# Patient Record
Sex: Male | Born: 1945
Health system: Southern US, Community
[De-identification: ages and names within clinical notes are randomized; demographics above are authoritative.]

## PROBLEM LIST (undated history)

## (undated) DIAGNOSIS — R0600 Dyspnea, unspecified: Secondary | ICD-10-CM

## (undated) DIAGNOSIS — I350 Nonrheumatic aortic (valve) stenosis: Secondary | ICD-10-CM

## (undated) DIAGNOSIS — L821 Other seborrheic keratosis: Secondary | ICD-10-CM

## (undated) DIAGNOSIS — N529 Male erectile dysfunction, unspecified: Secondary | ICD-10-CM

## (undated) DIAGNOSIS — H269 Unspecified cataract: Secondary | ICD-10-CM

## (undated) DIAGNOSIS — K259 Gastric ulcer, unspecified as acute or chronic, without hemorrhage or perforation: Secondary | ICD-10-CM

## (undated) DIAGNOSIS — I10 Essential (primary) hypertension: Secondary | ICD-10-CM

## (undated) DIAGNOSIS — E785 Hyperlipidemia, unspecified: Secondary | ICD-10-CM

## (undated) DIAGNOSIS — J309 Allergic rhinitis, unspecified: Secondary | ICD-10-CM

## (undated) DIAGNOSIS — I2510 Atherosclerotic heart disease of native coronary artery without angina pectoris: Secondary | ICD-10-CM

## (undated) DIAGNOSIS — K5790 Diverticulosis of intestine, part unspecified, without perforation or abscess without bleeding: Secondary | ICD-10-CM

## (undated) DIAGNOSIS — D126 Benign neoplasm of colon, unspecified: Secondary | ICD-10-CM

## (undated) DIAGNOSIS — T8859XA Other complications of anesthesia, initial encounter: Secondary | ICD-10-CM

## (undated) DIAGNOSIS — K649 Unspecified hemorrhoids: Secondary | ICD-10-CM

## (undated) DIAGNOSIS — M5386 Other specified dorsopathies, lumbar region: Secondary | ICD-10-CM

## (undated) DIAGNOSIS — I440 Atrioventricular block, first degree: Secondary | ICD-10-CM

## (undated) DIAGNOSIS — T4145XA Adverse effect of unspecified anesthetic, initial encounter: Secondary | ICD-10-CM

## (undated) DIAGNOSIS — R7303 Prediabetes: Secondary | ICD-10-CM

## (undated) DIAGNOSIS — M19019 Primary osteoarthritis, unspecified shoulder: Secondary | ICD-10-CM

## (undated) DIAGNOSIS — I480 Paroxysmal atrial fibrillation: Secondary | ICD-10-CM

## (undated) DIAGNOSIS — I5189 Other ill-defined heart diseases: Secondary | ICD-10-CM

## (undated) HISTORY — DX: Other ill-defined heart diseases: I51.89

## (undated) HISTORY — PX: CARDIOVERSION: SHX1299

## (undated) HISTORY — DX: Diverticulosis of intestine, part unspecified, without perforation or abscess without bleeding: K57.90

## (undated) HISTORY — DX: Gastric ulcer, unspecified as acute or chronic, without hemorrhage or perforation: K25.9

## (undated) HISTORY — PX: JOINT REPLACEMENT: SHX530

## (undated) HISTORY — DX: Atherosclerotic heart disease of native coronary artery without angina pectoris: I25.10

## (undated) HISTORY — DX: Essential (primary) hypertension: I10

## (undated) HISTORY — DX: Atrioventricular block, first degree: I44.0

## (undated) HISTORY — PX: COLONOSCOPY: SHX174

## (undated) HISTORY — DX: Primary osteoarthritis, unspecified shoulder: M19.019

## (undated) HISTORY — DX: Benign neoplasm of colon, unspecified: D12.6

## (undated) HISTORY — DX: Allergic rhinitis, unspecified: J30.9

## (undated) HISTORY — DX: Hyperlipidemia, unspecified: E78.5

## (undated) HISTORY — DX: Nonrheumatic aortic (valve) stenosis: I35.0

## (undated) HISTORY — DX: Unspecified hemorrhoids: K64.9

## (undated) HISTORY — DX: Other specified dorsopathies, lumbar region: M53.86

## (undated) HISTORY — PX: OTHER SURGICAL HISTORY: SHX169

## (undated) HISTORY — PX: TONSILLECTOMY: SUR1361

## (undated) HISTORY — DX: Male erectile dysfunction, unspecified: N52.9

## (undated) HISTORY — DX: Other seborrheic keratosis: L82.1

## (undated) HISTORY — DX: Paroxysmal atrial fibrillation: I48.0

---

## 2005-06-30 DIAGNOSIS — I509 Heart failure, unspecified: Secondary | ICD-10-CM

## 2005-06-30 HISTORY — DX: Heart failure, unspecified: I50.9

## 2005-06-30 HISTORY — PX: CARDIAC CATHETERIZATION: SHX172

## 2006-02-26 ENCOUNTER — Encounter: Admission: RE | Admit: 2006-02-26 | Discharge: 2006-04-02 | Payer: Self-pay | Admitting: Occupational Medicine

## 2006-03-30 ENCOUNTER — Encounter: Admission: RE | Admit: 2006-03-30 | Discharge: 2006-03-30 | Payer: Self-pay | Admitting: Occupational Medicine

## 2006-04-04 ENCOUNTER — Emergency Department (HOSPITAL_COMMUNITY): Admission: EM | Admit: 2006-04-04 | Discharge: 2006-04-04 | Payer: Self-pay | Admitting: Family Medicine

## 2006-04-05 ENCOUNTER — Emergency Department (HOSPITAL_COMMUNITY): Admission: EM | Admit: 2006-04-05 | Discharge: 2006-04-05 | Payer: Self-pay | Admitting: Family Medicine

## 2006-04-07 ENCOUNTER — Encounter (INDEPENDENT_AMBULATORY_CARE_PROVIDER_SITE_OTHER): Payer: Self-pay | Admitting: *Deleted

## 2006-04-07 ENCOUNTER — Ambulatory Visit: Payer: Self-pay | Admitting: Cardiovascular Disease

## 2006-04-07 ENCOUNTER — Inpatient Hospital Stay (HOSPITAL_COMMUNITY): Admission: EM | Admit: 2006-04-07 | Discharge: 2006-04-14 | Payer: Self-pay | Admitting: Emergency Medicine

## 2006-04-07 ENCOUNTER — Ambulatory Visit: Payer: Self-pay | Admitting: Internal Medicine

## 2006-04-10 ENCOUNTER — Encounter (INDEPENDENT_AMBULATORY_CARE_PROVIDER_SITE_OTHER): Payer: Self-pay | Admitting: *Deleted

## 2006-04-28 ENCOUNTER — Ambulatory Visit: Payer: Self-pay | Admitting: Cardiology

## 2006-05-01 ENCOUNTER — Encounter (INDEPENDENT_AMBULATORY_CARE_PROVIDER_SITE_OTHER): Payer: Self-pay | Admitting: *Deleted

## 2006-05-01 ENCOUNTER — Ambulatory Visit: Payer: Self-pay | Admitting: Internal Medicine

## 2006-05-01 LAB — CONVERTED CEMR LAB
BUN: 9 mg/dL (ref 6–23)
CO2: 28 meq/L (ref 19–32)
Calcium: 9.2 mg/dL (ref 8.4–10.5)
Chloride: 102 meq/L (ref 96–112)
Creatinine, Ser: 0.8 mg/dL (ref 0.40–1.50)
Glucose, Bld: 83 mg/dL (ref 70–99)
Potassium: 3.5 meq/L (ref 3.5–5.3)
Sodium: 139 meq/L (ref 135–145)

## 2006-05-02 ENCOUNTER — Ambulatory Visit (HOSPITAL_COMMUNITY): Admission: RE | Admit: 2006-05-02 | Discharge: 2006-05-02 | Payer: Self-pay | Admitting: Specialist

## 2006-06-17 ENCOUNTER — Ambulatory Visit: Payer: Self-pay | Admitting: Cardiology

## 2006-07-14 DIAGNOSIS — I428 Other cardiomyopathies: Secondary | ICD-10-CM | POA: Insufficient documentation

## 2006-07-14 DIAGNOSIS — I480 Paroxysmal atrial fibrillation: Secondary | ICD-10-CM

## 2006-07-14 DIAGNOSIS — D509 Iron deficiency anemia, unspecified: Secondary | ICD-10-CM | POA: Insufficient documentation

## 2006-07-14 HISTORY — DX: Paroxysmal atrial fibrillation: I48.0

## 2006-07-20 ENCOUNTER — Encounter: Admission: RE | Admit: 2006-07-20 | Discharge: 2006-10-18 | Payer: Self-pay | Admitting: Specialist

## 2006-07-22 ENCOUNTER — Ambulatory Visit: Payer: Self-pay | Admitting: Cardiology

## 2006-07-22 LAB — CONVERTED CEMR LAB
ALT: 7 units/L (ref 0–40)
AST: 18 units/L (ref 0–37)
Albumin: 3.4 g/dL — ABNORMAL LOW (ref 3.5–5.2)
Alkaline Phosphatase: 101 units/L (ref 39–117)
BUN: 10 mg/dL (ref 6–23)
CO2: 27 meq/L (ref 19–32)
Calcium: 8.9 mg/dL (ref 8.4–10.5)
Chloride: 102 meq/L (ref 96–112)
Cholesterol: 120 mg/dL (ref 0–200)
Creatinine, Ser: 1 mg/dL (ref 0.4–1.5)
GFR calc Af Amer: 99 mL/min
GFR calc non Af Amer: 82 mL/min
Glucose, Bld: 81 mg/dL (ref 70–99)
HDL: 26.1 mg/dL — ABNORMAL LOW (ref >39.0)
LDL Cholesterol: 74 mg/dL (ref 0–99)
Potassium: 3.9 meq/L (ref 3.5–5.1)
Sodium: 137 meq/L (ref 135–145)
Total Bilirubin: 0.6 mg/dL (ref 0.3–1.2)
Total CHOL/HDL Ratio: 4.6
Total Protein: 7.1 g/dL (ref 6.0–8.3)
Triglycerides: 101 mg/dL (ref 0–149)
VLDL: 20 mg/dL (ref 0–40)

## 2006-09-15 ENCOUNTER — Ambulatory Visit: Payer: Self-pay | Admitting: Internal Medicine

## 2006-09-15 ENCOUNTER — Encounter (INDEPENDENT_AMBULATORY_CARE_PROVIDER_SITE_OTHER): Payer: Self-pay | Admitting: *Deleted

## 2006-09-15 ENCOUNTER — Ambulatory Visit: Payer: Self-pay | Admitting: Cardiology

## 2006-09-15 LAB — CONVERTED CEMR LAB
BUN: 19 mg/dL (ref 6–23)
CO2: 22 meq/L (ref 19–32)
Calcium: 9.4 mg/dL (ref 8.4–10.5)
Chloride: 102 meq/L (ref 96–112)
Creatinine, Ser: 0.96 mg/dL (ref 0.40–1.50)
Glucose, Bld: 99 mg/dL (ref 70–99)
HCT: 37.7 % — ABNORMAL LOW (ref 39.0–52.0)
Hemoglobin: 11.9 g/dL — ABNORMAL LOW (ref 13.0–17.0)
MCHC: 31.6 g/dL (ref 30.0–36.0)
MCV: 87.3 fL (ref 78.0–100.0)
Platelets: 360 10*3/uL (ref 150–400)
Potassium: 4.2 meq/L (ref 3.5–5.3)
RBC: 4.32 M/uL (ref 4.22–5.81)
RDW: 15.1 % — ABNORMAL HIGH (ref 11.5–14.0)
Sodium: 136 meq/L (ref 135–145)
WBC: 10.2 10*3/uL (ref 4.0–10.5)

## 2006-09-22 ENCOUNTER — Ambulatory Visit: Payer: Self-pay | Admitting: Cardiology

## 2006-09-28 ENCOUNTER — Ambulatory Visit: Payer: Self-pay | Admitting: Internal Medicine

## 2006-09-28 LAB — CONVERTED CEMR LAB: INR: 2.4

## 2006-10-05 ENCOUNTER — Encounter: Payer: Self-pay | Admitting: Pharmacist

## 2006-10-05 ENCOUNTER — Ambulatory Visit: Payer: Self-pay | Admitting: *Deleted

## 2006-10-05 LAB — CONVERTED CEMR LAB: INR: 2.4

## 2006-10-12 ENCOUNTER — Ambulatory Visit: Payer: Self-pay | Admitting: Hospitalist

## 2006-10-12 LAB — CONVERTED CEMR LAB: INR: 3

## 2006-10-19 ENCOUNTER — Ambulatory Visit: Payer: Self-pay | Admitting: Internal Medicine

## 2006-10-19 LAB — CONVERTED CEMR LAB: INR: 2.6

## 2006-10-27 ENCOUNTER — Ambulatory Visit: Payer: Self-pay | Admitting: Hospitalist

## 2006-10-27 LAB — CONVERTED CEMR LAB: INR: 2.9

## 2006-10-28 ENCOUNTER — Ambulatory Visit: Payer: Self-pay | Admitting: *Deleted

## 2006-10-28 ENCOUNTER — Encounter (INDEPENDENT_AMBULATORY_CARE_PROVIDER_SITE_OTHER): Payer: Self-pay | Admitting: *Deleted

## 2006-10-28 LAB — CONVERTED CEMR LAB
ALT: <8 units/L (ref 0–53)
AST: 12 units/L (ref 0–37)
Albumin: 3.8 g/dL (ref 3.5–5.2)
Alkaline Phosphatase: 99 units/L (ref 39–117)
Bilirubin, Direct: 0.1 mg/dL (ref 0.0–0.3)
Cholesterol: 145 mg/dL (ref 0–200)
HDL: 24 mg/dL — ABNORMAL LOW (ref >39)
Indirect Bilirubin: 0.4 mg/dL (ref 0.0–0.9)
LDL Cholesterol: 90 mg/dL (ref 0–99)
Total Bilirubin: 0.5 mg/dL (ref 0.3–1.2)
Total CHOL/HDL Ratio: 6
Total Protein: 7.1 g/dL (ref 6.0–8.3)
Triglycerides: 156 mg/dL — ABNORMAL HIGH (ref <150)
VLDL: 31 mg/dL (ref 0–40)

## 2006-11-03 ENCOUNTER — Ambulatory Visit (HOSPITAL_COMMUNITY): Admission: RE | Admit: 2006-11-03 | Discharge: 2006-11-03 | Payer: Self-pay | Admitting: Cardiology

## 2006-11-03 ENCOUNTER — Ambulatory Visit: Payer: Self-pay | Admitting: Cardiology

## 2006-11-03 ENCOUNTER — Ambulatory Visit: Payer: Self-pay | Admitting: Internal Medicine

## 2006-11-03 LAB — CONVERTED CEMR LAB: INR: 2.6

## 2006-11-10 ENCOUNTER — Encounter (INDEPENDENT_AMBULATORY_CARE_PROVIDER_SITE_OTHER): Payer: Self-pay | Admitting: Pharmacist

## 2006-11-10 ENCOUNTER — Ambulatory Visit: Payer: Self-pay | Admitting: Internal Medicine

## 2006-11-10 LAB — CONVERTED CEMR LAB: INR: 2.5

## 2006-11-11 ENCOUNTER — Encounter: Payer: Self-pay | Admitting: Pharmacist

## 2006-11-17 ENCOUNTER — Ambulatory Visit: Payer: Self-pay | Admitting: Hospitalist

## 2006-11-17 LAB — CONVERTED CEMR LAB: INR: 2.3

## 2006-11-18 ENCOUNTER — Ambulatory Visit: Payer: Self-pay | Admitting: Cardiology

## 2006-11-24 ENCOUNTER — Ambulatory Visit: Payer: Self-pay | Admitting: Internal Medicine

## 2006-11-26 ENCOUNTER — Ambulatory Visit (HOSPITAL_COMMUNITY): Admission: RE | Admit: 2006-11-26 | Discharge: 2006-11-26 | Payer: Self-pay | Admitting: Cardiology

## 2006-11-30 ENCOUNTER — Ambulatory Visit: Payer: Self-pay | Admitting: Internal Medicine

## 2006-11-30 LAB — CONVERTED CEMR LAB: INR: 4.7

## 2006-12-01 LAB — CONVERTED CEMR LAB
INR: 2.2
Prothrombin Time: 2.2 s

## 2006-12-02 ENCOUNTER — Ambulatory Visit: Payer: Self-pay | Admitting: Cardiology

## 2006-12-02 ENCOUNTER — Ambulatory Visit (HOSPITAL_COMMUNITY): Admission: RE | Admit: 2006-12-02 | Discharge: 2006-12-02 | Payer: Self-pay | Admitting: Cardiology

## 2006-12-02 ENCOUNTER — Encounter: Payer: Self-pay | Admitting: Cardiology

## 2006-12-08 ENCOUNTER — Ambulatory Visit: Payer: Self-pay | Admitting: Internal Medicine

## 2006-12-08 LAB — CONVERTED CEMR LAB: INR: 3.9

## 2006-12-23 ENCOUNTER — Ambulatory Visit: Payer: Self-pay | Admitting: Cardiology

## 2006-12-23 ENCOUNTER — Ambulatory Visit: Payer: Self-pay | Admitting: Internal Medicine

## 2006-12-23 LAB — CONVERTED CEMR LAB: INR: 1.3

## 2007-01-04 ENCOUNTER — Ambulatory Visit: Payer: Self-pay | Admitting: Internal Medicine

## 2007-01-04 ENCOUNTER — Encounter: Payer: Self-pay | Admitting: Pharmacist

## 2007-01-04 LAB — CONVERTED CEMR LAB
INR: 3.4
INR: 3.4

## 2007-01-06 ENCOUNTER — Encounter (INDEPENDENT_AMBULATORY_CARE_PROVIDER_SITE_OTHER): Payer: Self-pay | Admitting: *Deleted

## 2007-01-07 ENCOUNTER — Ambulatory Visit: Payer: Self-pay | Admitting: Internal Medicine

## 2007-01-07 DIAGNOSIS — M25519 Pain in unspecified shoulder: Secondary | ICD-10-CM | POA: Insufficient documentation

## 2007-01-18 ENCOUNTER — Ambulatory Visit: Payer: Self-pay | Admitting: *Deleted

## 2007-01-18 LAB — CONVERTED CEMR LAB: INR: 4.5

## 2007-01-28 ENCOUNTER — Encounter: Payer: Self-pay | Admitting: Pharmacist

## 2007-01-28 LAB — CONVERTED CEMR LAB: INR: 3.2

## 2007-02-18 ENCOUNTER — Encounter: Payer: Self-pay | Admitting: Pharmacist

## 2007-02-18 LAB — CONVERTED CEMR LAB: INR: 2.7

## 2007-03-03 ENCOUNTER — Ambulatory Visit (HOSPITAL_COMMUNITY): Admission: RE | Admit: 2007-03-03 | Discharge: 2007-03-03 | Payer: Self-pay | Admitting: Cardiology

## 2007-03-03 ENCOUNTER — Ambulatory Visit: Payer: Self-pay | Admitting: Cardiology

## 2007-03-03 LAB — CONVERTED CEMR LAB
ALT: 14 units/L (ref 0–53)
AST: 20 units/L (ref 0–37)
Albumin: 3.6 g/dL (ref 3.5–5.2)
Alkaline Phosphatase: 97 units/L (ref 39–117)
BUN: 26 mg/dL — ABNORMAL HIGH (ref 6–23)
Basophils Absolute: 0.1 10*3/uL (ref 0.0–0.1)
Basophils Relative: 1 % (ref 0.0–1.0)
Bilirubin, Direct: 0.1 mg/dL (ref 0.0–0.3)
CO2: 26 meq/L (ref 19–32)
Calcium: 9 mg/dL (ref 8.4–10.5)
Chloride: 108 meq/L (ref 96–112)
Cholesterol: 157 mg/dL (ref 0–200)
Creatinine, Ser: 1.8 mg/dL — ABNORMAL HIGH (ref 0.4–1.5)
Direct LDL: 89.6 mg/dL
Eosinophils Absolute: 0.6 10*3/uL (ref 0.0–0.6)
Eosinophils Relative: 4.9 % (ref 0.0–5.0)
GFR calc Af Amer: 50 mL/min
GFR calc non Af Amer: 41 mL/min
Glucose, Bld: 88 mg/dL (ref 70–99)
HCT: 35.5 % — ABNORMAL LOW (ref 39.0–52.0)
HDL: 20.1 mg/dL — ABNORMAL LOW (ref >39.0)
Hemoglobin: 12.5 g/dL — ABNORMAL LOW (ref 13.0–17.0)
Lymphocytes Relative: 28 % (ref 12.0–46.0)
MCHC: 35.2 g/dL (ref 30.0–36.0)
MCV: 89.5 fL (ref 78.0–100.0)
Monocytes Absolute: 0.7 10*3/uL (ref 0.2–0.7)
Monocytes Relative: 5.1 % (ref 3.0–11.0)
Neutro Abs: 8 10*3/uL — ABNORMAL HIGH (ref 1.4–7.7)
Neutrophils Relative %: 61 % (ref 43.0–77.0)
Platelets: 237 10*3/uL (ref 150–400)
Potassium: 4.2 meq/L (ref 3.5–5.1)
RBC: 3.97 M/uL — ABNORMAL LOW (ref 4.22–5.81)
RDW: 15.6 % — ABNORMAL HIGH (ref 11.5–14.6)
Sodium: 141 meq/L (ref 135–145)
TSH: 2.84 microintl units/mL (ref 0.35–5.50)
Total Bilirubin: 0.5 mg/dL (ref 0.3–1.2)
Total CHOL/HDL Ratio: 7.8
Total Protein: 7.1 g/dL (ref 6.0–8.3)
Triglycerides: 334 mg/dL (ref 0–149)
VLDL: 67 mg/dL — ABNORMAL HIGH (ref 0–40)
WBC: 13 10*3/uL — ABNORMAL HIGH (ref 4.5–10.5)

## 2007-03-05 ENCOUNTER — Telehealth (INDEPENDENT_AMBULATORY_CARE_PROVIDER_SITE_OTHER): Payer: Self-pay | Admitting: *Deleted

## 2007-03-23 ENCOUNTER — Ambulatory Visit: Payer: Self-pay | Admitting: Internal Medicine

## 2007-03-23 LAB — CONVERTED CEMR LAB: INR: 3.1

## 2007-04-15 ENCOUNTER — Encounter (INDEPENDENT_AMBULATORY_CARE_PROVIDER_SITE_OTHER): Payer: Self-pay | Admitting: *Deleted

## 2007-04-16 ENCOUNTER — Ambulatory Visit: Payer: Self-pay | Admitting: *Deleted

## 2007-04-16 ENCOUNTER — Encounter (INDEPENDENT_AMBULATORY_CARE_PROVIDER_SITE_OTHER): Payer: Self-pay | Admitting: *Deleted

## 2007-04-16 LAB — CONVERTED CEMR LAB
ALT: 12 units/L (ref 0–53)
AST: 18 units/L (ref 0–37)
Albumin: 4.2 g/dL (ref 3.5–5.2)
Alkaline Phosphatase: 92 units/L (ref 39–117)
BUN: 17 mg/dL (ref 6–23)
Bilirubin, Direct: 0.1 mg/dL (ref 0.0–0.3)
CO2: 25 meq/L (ref 19–32)
Calcium: 8.8 mg/dL (ref 8.4–10.5)
Chloride: 102 meq/L (ref 96–112)
Cholesterol: 135 mg/dL (ref 0–200)
Creatinine, Ser: 1.18 mg/dL (ref 0.40–1.50)
Glucose, Bld: 82 mg/dL (ref 70–99)
HCT: 38.9 % — ABNORMAL LOW (ref 39.0–52.0)
HDL: 26 mg/dL — ABNORMAL LOW (ref >39)
Hemoglobin: 12.4 g/dL — ABNORMAL LOW (ref 13.0–17.0)
Indirect Bilirubin: 0.4 mg/dL (ref 0.0–0.9)
LDL Cholesterol: 76 mg/dL (ref 0–99)
MCHC: 31.9 g/dL (ref 30.0–36.0)
MCV: 99.2 fL (ref 78.0–100.0)
Platelets: 212 10*3/uL (ref 150–400)
Potassium: 4.5 meq/L (ref 3.5–5.3)
RBC: 3.92 M/uL — ABNORMAL LOW (ref 4.22–5.81)
RDW: 14.6 % — ABNORMAL HIGH (ref 11.5–14.0)
Sodium: 138 meq/L (ref 135–145)
Total Bilirubin: 0.5 mg/dL (ref 0.3–1.2)
Total CHOL/HDL Ratio: 5.2
Total Protein: 7.4 g/dL (ref 6.0–8.3)
Triglycerides: 163 mg/dL — ABNORMAL HIGH (ref <150)
VLDL: 33 mg/dL (ref 0–40)
WBC: 12.1 10*3/uL — ABNORMAL HIGH (ref 4.0–10.5)

## 2007-04-20 ENCOUNTER — Encounter (INDEPENDENT_AMBULATORY_CARE_PROVIDER_SITE_OTHER): Payer: Self-pay | Admitting: *Deleted

## 2007-04-26 ENCOUNTER — Ambulatory Visit: Payer: Self-pay | Admitting: Infectious Diseases

## 2007-04-26 LAB — CONVERTED CEMR LAB: INR: 2.1

## 2007-05-24 ENCOUNTER — Ambulatory Visit: Payer: Self-pay | Admitting: Hospitalist

## 2007-05-24 LAB — CONVERTED CEMR LAB: INR: 2.5

## 2007-06-28 ENCOUNTER — Ambulatory Visit: Payer: Self-pay | Admitting: Internal Medicine

## 2007-06-28 LAB — CONVERTED CEMR LAB: INR: 3.3

## 2007-07-16 ENCOUNTER — Encounter: Admission: RE | Admit: 2007-07-16 | Discharge: 2007-07-16 | Payer: Self-pay | Admitting: Internal Medicine

## 2007-08-04 ENCOUNTER — Encounter: Payer: Self-pay | Admitting: Pharmacist

## 2007-08-04 ENCOUNTER — Ambulatory Visit: Payer: Self-pay | Admitting: Hospitalist

## 2007-08-04 LAB — CONVERTED CEMR LAB
INR: 7
INR: 7

## 2007-08-06 ENCOUNTER — Ambulatory Visit: Payer: Self-pay | Admitting: Internal Medicine

## 2007-08-06 LAB — CONVERTED CEMR LAB: INR: 3.6

## 2007-08-09 ENCOUNTER — Ambulatory Visit: Payer: Self-pay | Admitting: Hospitalist

## 2007-08-09 LAB — CONVERTED CEMR LAB: INR: 2

## 2007-08-20 ENCOUNTER — Ambulatory Visit (HOSPITAL_COMMUNITY): Admission: RE | Admit: 2007-08-20 | Discharge: 2007-08-20 | Payer: Self-pay | Admitting: Orthopedic Surgery

## 2007-09-02 ENCOUNTER — Ambulatory Visit: Payer: Self-pay | Admitting: *Deleted

## 2007-09-02 LAB — CONVERTED CEMR LAB: INR: 4.2

## 2007-09-03 ENCOUNTER — Ambulatory Visit: Payer: Self-pay | Admitting: Cardiology

## 2007-09-06 ENCOUNTER — Ambulatory Visit: Payer: Self-pay | Admitting: Cardiology

## 2007-09-06 LAB — CONVERTED CEMR LAB
ALT: 19 units/L (ref 0–53)
AST: 23 units/L (ref 0–37)
Albumin: 4.2 g/dL (ref 3.5–5.2)
Alkaline Phosphatase: 57 units/L (ref 39–117)
BUN: 41 mg/dL — ABNORMAL HIGH (ref 6–23)
Basophils Absolute: 0.1 10*3/uL (ref 0.0–0.1)
Basophils Relative: 1 % (ref 0.0–1.0)
Bilirubin, Direct: 0.1 mg/dL (ref 0.0–0.3)
CO2: 24 meq/L (ref 19–32)
Calcium: 9.4 mg/dL (ref 8.4–10.5)
Chloride: 110 meq/L (ref 96–112)
Cholesterol: 177 mg/dL (ref 0–200)
Creatinine, Ser: 1.5 mg/dL (ref 0.4–1.5)
Eosinophils Absolute: 0.5 10*3/uL (ref 0.0–0.6)
Eosinophils Relative: 4.4 % (ref 0.0–5.0)
GFR calc Af Amer: 61 mL/min
GFR calc non Af Amer: 51 mL/min
Glucose, Bld: 101 mg/dL — ABNORMAL HIGH (ref 70–99)
HCT: 38.1 % — ABNORMAL LOW (ref 39.0–52.0)
HDL: 33.3 mg/dL — ABNORMAL LOW (ref >39.0)
Hemoglobin: 12.7 g/dL — ABNORMAL LOW (ref 13.0–17.0)
LDL Cholesterol: 104 mg/dL — ABNORMAL HIGH (ref 0–99)
Lymphocytes Relative: 23.2 % (ref 12.0–46.0)
MCHC: 33.4 g/dL (ref 30.0–36.0)
MCV: 95.9 fL (ref 78.0–100.0)
Monocytes Absolute: 0.7 10*3/uL (ref 0.2–0.7)
Monocytes Relative: 5.9 % (ref 3.0–11.0)
Neutro Abs: 7.7 10*3/uL (ref 1.4–7.7)
Neutrophils Relative %: 65.5 % (ref 43.0–77.0)
Platelets: 215 10*3/uL (ref 150–400)
Potassium: 5.4 meq/L — ABNORMAL HIGH (ref 3.5–5.1)
RBC: 3.98 M/uL — ABNORMAL LOW (ref 4.22–5.81)
RDW: 12.9 % (ref 11.5–14.6)
Sodium: 139 meq/L (ref 135–145)
TSH: 1.88 microintl units/mL (ref 0.35–5.50)
Total Bilirubin: 0.5 mg/dL (ref 0.3–1.2)
Total CHOL/HDL Ratio: 5.3
Total Protein: 7.8 g/dL (ref 6.0–8.3)
Triglycerides: 200 mg/dL — ABNORMAL HIGH (ref 0–149)
VLDL: 40 mg/dL (ref 0–40)
WBC: 11.7 10*3/uL — ABNORMAL HIGH (ref 4.5–10.5)

## 2007-09-21 ENCOUNTER — Ambulatory Visit (HOSPITAL_COMMUNITY): Admission: RE | Admit: 2007-09-21 | Discharge: 2007-09-22 | Payer: Self-pay | Admitting: Orthopedic Surgery

## 2007-10-07 ENCOUNTER — Ambulatory Visit: Payer: Self-pay | Admitting: Infectious Disease

## 2007-10-07 ENCOUNTER — Telehealth (INDEPENDENT_AMBULATORY_CARE_PROVIDER_SITE_OTHER): Payer: Self-pay | Admitting: Pharmacist

## 2007-10-25 ENCOUNTER — Ambulatory Visit: Payer: Self-pay | Admitting: Hospitalist

## 2007-10-25 ENCOUNTER — Ambulatory Visit: Payer: Self-pay

## 2007-10-25 LAB — CONVERTED CEMR LAB: INR: 2.5

## 2007-10-26 ENCOUNTER — Encounter: Admission: RE | Admit: 2007-10-26 | Discharge: 2008-01-24 | Payer: Self-pay | Admitting: Orthopedic Surgery

## 2007-12-01 ENCOUNTER — Ambulatory Visit: Payer: Self-pay | Admitting: *Deleted

## 2007-12-01 ENCOUNTER — Encounter: Payer: Self-pay | Admitting: Pharmacist

## 2007-12-01 LAB — CONVERTED CEMR LAB: INR: 1.8

## 2007-12-27 ENCOUNTER — Ambulatory Visit: Payer: Self-pay | Admitting: Cardiology

## 2007-12-27 LAB — CONVERTED CEMR LAB
ALT: 16 units/L (ref 0–53)
AST: 23 units/L (ref 0–37)
Albumin: 3.9 g/dL (ref 3.5–5.2)
Alkaline Phosphatase: 75 units/L (ref 39–117)
Bilirubin, Direct: 0.1 mg/dL (ref 0.0–0.3)
Cholesterol: 97 mg/dL (ref 0–200)
HDL: 29 mg/dL — ABNORMAL LOW (ref >39.0)
LDL Cholesterol: 43 mg/dL (ref 0–99)
Total Bilirubin: 0.5 mg/dL (ref 0.3–1.2)
Total CHOL/HDL Ratio: 3.3
Total Protein: 7.8 g/dL (ref 6.0–8.3)
Triglycerides: 127 mg/dL (ref 0–149)
VLDL: 25 mg/dL (ref 0–40)

## 2008-01-18 ENCOUNTER — Ambulatory Visit: Payer: Self-pay | Admitting: Internal Medicine

## 2008-01-25 ENCOUNTER — Encounter: Admission: RE | Admit: 2008-01-25 | Discharge: 2008-01-27 | Payer: Self-pay | Admitting: Orthopedic Surgery

## 2008-08-28 ENCOUNTER — Ambulatory Visit: Payer: Self-pay | Admitting: Cardiology

## 2008-09-07 ENCOUNTER — Ambulatory Visit: Payer: Self-pay

## 2008-09-07 ENCOUNTER — Encounter: Payer: Self-pay | Admitting: Cardiology

## 2008-09-07 ENCOUNTER — Ambulatory Visit: Payer: Self-pay | Admitting: Cardiology

## 2008-09-07 LAB — CONVERTED CEMR LAB
ALT: 19 units/L (ref 0–53)
AST: 27 units/L (ref 0–37)
Albumin: 3.8 g/dL (ref 3.5–5.2)
Alkaline Phosphatase: 67 units/L (ref 39–117)
BUN: 26 mg/dL — ABNORMAL HIGH (ref 6–23)
Bilirubin, Direct: 0.2 mg/dL (ref 0.0–0.3)
CO2: 24 meq/L (ref 19–32)
Calcium: 8.9 mg/dL (ref 8.4–10.5)
Chloride: 109 meq/L (ref 96–112)
Cholesterol: 100 mg/dL (ref 0–200)
Creatinine, Ser: 1 mg/dL (ref 0.4–1.5)
GFR calc Af Amer: 98 mL/min
GFR calc non Af Amer: 81 mL/min
Glucose, Bld: 94 mg/dL (ref 70–99)
HDL: 23.6 mg/dL — ABNORMAL LOW (ref >39.0)
LDL Cholesterol: 62 mg/dL (ref 0–99)
Potassium: 4.8 meq/L (ref 3.5–5.1)
Sodium: 139 meq/L (ref 135–145)
Total Bilirubin: 0.8 mg/dL (ref 0.3–1.2)
Total CHOL/HDL Ratio: 4.2
Total Protein: 6.9 g/dL (ref 6.0–8.3)
Triglycerides: 71 mg/dL (ref 0–149)
VLDL: 14 mg/dL (ref 0–40)

## 2009-04-17 ENCOUNTER — Encounter: Admission: RE | Admit: 2009-04-17 | Discharge: 2009-04-17 | Payer: Self-pay | Admitting: Specialist

## 2009-05-07 ENCOUNTER — Ambulatory Visit: Payer: Self-pay | Admitting: Internal Medicine

## 2009-05-07 DIAGNOSIS — N529 Male erectile dysfunction, unspecified: Secondary | ICD-10-CM

## 2009-05-07 DIAGNOSIS — M79609 Pain in unspecified limb: Secondary | ICD-10-CM | POA: Insufficient documentation

## 2009-05-07 DIAGNOSIS — R42 Dizziness and giddiness: Secondary | ICD-10-CM | POA: Insufficient documentation

## 2009-05-07 HISTORY — DX: Male erectile dysfunction, unspecified: N52.9

## 2009-05-08 LAB — CONVERTED CEMR LAB
BUN: 30 mg/dL — ABNORMAL HIGH (ref 6–23)
CO2: 17 meq/L — ABNORMAL LOW (ref 19–32)
Calcium: 9.2 mg/dL (ref 8.4–10.5)
Chloride: 111 meq/L (ref 96–112)
Creatinine, Ser: 1.2 mg/dL (ref 0.40–1.50)
Glucose, Bld: 127 mg/dL — ABNORMAL HIGH (ref 70–99)
Potassium: 4.4 meq/L (ref 3.5–5.3)
Sodium: 140 meq/L (ref 135–145)

## 2009-05-09 ENCOUNTER — Ambulatory Visit: Payer: Self-pay | Admitting: Infectious Disease

## 2009-05-09 LAB — CONVERTED CEMR LAB
BUN: 43 mg/dL — ABNORMAL HIGH (ref 6–23)
CO2: 18 meq/L — ABNORMAL LOW (ref 19–32)
Calcium: 9.4 mg/dL (ref 8.4–10.5)
Chloride: 108 meq/L (ref 96–112)
Creatinine, Ser: 1.54 mg/dL — ABNORMAL HIGH (ref 0.40–1.50)
Glucose, Bld: 110 mg/dL — ABNORMAL HIGH (ref 70–99)
Potassium: 4.6 meq/L (ref 3.5–5.3)
Sodium: 140 meq/L (ref 135–145)

## 2009-05-14 ENCOUNTER — Ambulatory Visit (HOSPITAL_COMMUNITY): Admission: RE | Admit: 2009-05-14 | Discharge: 2009-05-14 | Payer: Self-pay | Admitting: Internal Medicine

## 2009-07-16 ENCOUNTER — Telehealth: Payer: Self-pay | Admitting: Cardiology

## 2009-10-02 DIAGNOSIS — I2510 Atherosclerotic heart disease of native coronary artery without angina pectoris: Secondary | ICD-10-CM | POA: Insufficient documentation

## 2009-10-02 DIAGNOSIS — E785 Hyperlipidemia, unspecified: Secondary | ICD-10-CM | POA: Insufficient documentation

## 2009-10-02 DIAGNOSIS — I25118 Atherosclerotic heart disease of native coronary artery with other forms of angina pectoris: Secondary | ICD-10-CM

## 2009-10-02 HISTORY — DX: Atherosclerotic heart disease of native coronary artery without angina pectoris: I25.10

## 2009-10-02 HISTORY — DX: Hyperlipidemia, unspecified: E78.5

## 2009-10-03 ENCOUNTER — Ambulatory Visit: Payer: Self-pay | Admitting: Cardiology

## 2009-10-09 ENCOUNTER — Ambulatory Visit: Payer: Self-pay | Admitting: Cardiology

## 2009-10-09 ENCOUNTER — Encounter (INDEPENDENT_AMBULATORY_CARE_PROVIDER_SITE_OTHER): Payer: Self-pay | Admitting: *Deleted

## 2009-10-09 LAB — CONVERTED CEMR LAB
ALT: 18 units/L (ref 0–53)
AST: 26 units/L (ref 0–37)
Albumin: 3.9 g/dL (ref 3.5–5.2)
Alkaline Phosphatase: 48 units/L (ref 39–117)
BUN: 28 mg/dL — ABNORMAL HIGH (ref 6–23)
Bilirubin, Direct: 0 mg/dL (ref 0.0–0.3)
CO2: 27 meq/L (ref 19–32)
Calcium: 9 mg/dL (ref 8.4–10.5)
Chloride: 107 meq/L (ref 96–112)
Cholesterol: 111 mg/dL (ref 0–200)
Creatinine, Ser: 1.2 mg/dL (ref 0.4–1.5)
GFR calc non Af Amer: 65.17 mL/min (ref >60)
Glucose, Bld: 88 mg/dL (ref 70–99)
HDL: 32.9 mg/dL — ABNORMAL LOW (ref >39.00)
LDL Cholesterol: 59 mg/dL (ref 0–99)
Potassium: 5.2 meq/L — ABNORMAL HIGH (ref 3.5–5.1)
Sodium: 140 meq/L (ref 135–145)
Total Bilirubin: 0.4 mg/dL (ref 0.3–1.2)
Total CHOL/HDL Ratio: 3
Total Protein: 7.1 g/dL (ref 6.0–8.3)
Triglycerides: 98 mg/dL (ref 0.0–149.0)
VLDL: 19.6 mg/dL (ref 0.0–40.0)

## 2010-02-25 ENCOUNTER — Ambulatory Visit (HOSPITAL_COMMUNITY): Admission: RE | Admit: 2010-02-25 | Discharge: 2010-02-25 | Payer: Self-pay | Admitting: Orthopedic Surgery

## 2010-04-17 ENCOUNTER — Ambulatory Visit: Payer: Self-pay | Admitting: Internal Medicine

## 2010-04-17 DIAGNOSIS — L989 Disorder of the skin and subcutaneous tissue, unspecified: Secondary | ICD-10-CM | POA: Insufficient documentation

## 2010-04-17 DIAGNOSIS — H833X9 Noise effects on inner ear, unspecified ear: Secondary | ICD-10-CM | POA: Insufficient documentation

## 2010-04-23 ENCOUNTER — Ambulatory Visit: Payer: Self-pay | Admitting: Internal Medicine

## 2010-04-24 DIAGNOSIS — E875 Hyperkalemia: Secondary | ICD-10-CM | POA: Insufficient documentation

## 2010-04-24 LAB — CONVERTED CEMR LAB
ALT: 12 units/L (ref 0–53)
AST: 17 units/L (ref 0–37)
Albumin: 3.9 g/dL (ref 3.5–5.2)
Alkaline Phosphatase: 44 units/L (ref 39–117)
BUN: 25 mg/dL — ABNORMAL HIGH (ref 6–23)
CO2: 26 meq/L (ref 19–32)
Calcium: 9.3 mg/dL (ref 8.4–10.5)
Chloride: 105 meq/L (ref 96–112)
Cholesterol: 120 mg/dL (ref 0–200)
Creatinine, Ser: 1.25 mg/dL (ref 0.40–1.50)
Glucose, Bld: 97 mg/dL (ref 70–99)
HDL: 38 mg/dL — ABNORMAL LOW (ref >39)
LDL Cholesterol: 65 mg/dL (ref 0–99)
Potassium: 5.6 meq/L — ABNORMAL HIGH (ref 3.5–5.3)
Sodium: 138 meq/L (ref 135–145)
Total Bilirubin: 0.3 mg/dL (ref 0.3–1.2)
Total CHOL/HDL Ratio: 3.2
Total Protein: 6.4 g/dL (ref 6.0–8.3)
Triglycerides: 85 mg/dL (ref <150)
VLDL: 17 mg/dL (ref 0–40)

## 2010-04-30 ENCOUNTER — Encounter: Payer: Self-pay | Admitting: Internal Medicine

## 2010-05-06 ENCOUNTER — Encounter: Payer: Self-pay | Admitting: Internal Medicine

## 2010-05-06 ENCOUNTER — Ambulatory Visit: Payer: Self-pay | Admitting: Internal Medicine

## 2010-05-07 LAB — CONVERTED CEMR LAB
BUN: 30 mg/dL — ABNORMAL HIGH (ref 6–23)
CO2: 23 meq/L (ref 19–32)
Calcium: 9.1 mg/dL (ref 8.4–10.5)
Chloride: 106 meq/L (ref 96–112)
Creatinine, Ser: 1.31 mg/dL (ref 0.40–1.50)
Glucose, Bld: 114 mg/dL — ABNORMAL HIGH (ref 70–99)
Potassium: 4.4 meq/L (ref 3.5–5.3)
Sodium: 138 meq/L (ref 135–145)

## 2010-05-08 ENCOUNTER — Telehealth: Payer: Self-pay | Admitting: Internal Medicine

## 2010-05-13 ENCOUNTER — Telehealth: Payer: Self-pay | Admitting: Internal Medicine

## 2010-06-10 ENCOUNTER — Telehealth: Payer: Self-pay | Admitting: Internal Medicine

## 2010-07-04 ENCOUNTER — Telehealth: Payer: Self-pay | Admitting: Cardiology

## 2010-07-08 ENCOUNTER — Emergency Department (HOSPITAL_COMMUNITY)
Admission: EM | Admit: 2010-07-08 | Discharge: 2010-07-08 | Payer: Self-pay | Source: Home / Self Care | Admitting: Family Medicine

## 2010-07-21 ENCOUNTER — Encounter: Payer: Self-pay | Admitting: Internal Medicine

## 2010-07-30 NOTE — Assessment & Plan Note (Signed)
Summary: COU/VS  Anticoagulant Therapy Managed by: [name]. Janie Morning  PharmD CACP Referring MD: Dr. [name] PCP: Dr. [name] [facility] Attending: [name] Indication 1: Atrial fibrillation Indication 2: Aftercare long term use Anticoagulants V58.61,V58.83 Start date: 09/18/2006 Duration: Indefinite  Patient Assessment Reviewed by: [name] PharmD  January 18, 2007 Medication review: verified warfarin dosage & schedule,verified previous prescription medications, verified doses & any changes, verified new medications, reviewed OTC medications, reviewed OTC health products-vitamins supplements etc Complications: none Dietary changes: none   Health status changes: none   Lifestyle changes: none   Recent/future hospitalizations: none   Recent/future procedures: none   Recent/future dental: none Patient Assessment Part 2:  Have you MISSED ANY DOSES or CHANGED TABLETS?  No missed Warfarin doses or changed tablets.  Have you had any BRUISING or BLEEDING ( nose or gum bleeds,blood in urine or stool)?  No reported bruising or bleeding in nose, gums, urine, stool.  Have you STARTED or STOPPED any MEDICATIONS, including OTC meds,herbals or supplements?  No other medications or herbal supplements were started or stopped.  Have you CHANGED your DIET, especially green vegetables,or ALCOHOL intake?  No changes in diet or alcohol intake.  Have you had any ILLNESSES or HOSPITALIZATIONS?  No reported illnesses or hospitalizations  Have you had any signs of CLOTTING?(chest discomfort,dizziness,shortness of breath,arms tingling,slurred speech,swelling or redness in leg)    No chest discomfort, dizziness, shortness of breath, tingling in arm, slurred speech, swelling, or redness in leg.     Treatment  Target INR: 2.0-3.0 INR: 4.5  Date: 01/18/2007 Regimen In:  32.5mg /week INR reflects regimen in: 4.5  New  Tablet strength: : 5mg  Regimen Out:     Sunday: 0 Tablet     Monday: 1  Tablet     Tuesday: 1 Tablet     Wednesday: 1/2 Tablet     Thursday: 1 Tablet      Friday: 1 Tablet     Saturday: 1 Tablet Total Weekly: 27.5mg /week mg  Next INR Due: 02/08/2007 Adjusted by: [name]. Alexandria Lodge III PharmD CACP   Return to anticoagulation clinic:  02/08/2007 Time of next visit: 0845

## 2010-07-30 NOTE — Progress Notes (Signed)
Summary: refill/gg  Phone Note Refill Request  on March 05, 2007 11:07 AM  Refills Requested: Medication #1:  NEXIUM 40 MG CPDR Take 1 tablet by mouth two times a day ***Pt going out of town and needs Rx hand written ***  Initial call taken by: Merrie Roof RN,  March 05, 2007 11:07 AM  Follow-up for Phone Call        Why is there no PCP assigned to this patient? Does he receive medical care elsewhere? Follow-up by: Eliseo Gum MD,  March 05, 2007 11:13 AM  Additional Follow-up for Phone Call Additional follow up Details #1::        Pt is going to be set up in the clinic, will refill enough to last until next appt this month. Pt understands that there will be no further refills, if he does not set up appt.   Additional Follow-up by: Eliseo Gum MD,  March 05, 2007 12:04 PM    Additional Follow-up for Phone Call Additional follow up Details #2::    Rx given to pt Follow-up by: Merrie Roof RN,  March 05, 2007 12:15 PM  New/Updated Medications: NEXIUM 40 MG CPDR (ESOMEPRAZOLE MAGNESIUM) Take 1 tablet by mouth once a day   Prescriptions: NEXIUM 40 MG CPDR (ESOMEPRAZOLE MAGNESIUM) Take 1 tablet by mouth once a day  #31 x 0   Entered and Authorized by:   Eliseo Gum MD   Signed by:   Eliseo Gum MD on 03/05/2007   Method used:   Print then Give to Patient   RxID:   [id number]

## 2010-07-30 NOTE — Assessment & Plan Note (Signed)
Summary: 261/DS  Anticoagulant Therapy Managed by: [name]. [name]  PharmD CACP Referring MD: Dr. [name] PCP: Dr. [name] [facility] Attending: Dr. Sampson Goon Indication 1: Atrial fibrillation Indication 2: Aftercare long term use Anticoagulants V58.61,V58.83 Start date: 09/18/2006 Duration: Indefinite  Patient Assessment Reviewed by: [name] PharmD  April 26, 2007 Medication review: verified warfarin dosage & schedule,verified previous prescription medications, verified doses & any changes, verified new medications, reviewed OTC medications, reviewed OTC health products-vitamins supplements etc Complications: none Dietary changes: none   Health status changes: none   Lifestyle changes: none   Recent/future hospitalizations: none   Recent/future procedures: none   Recent/future dental: none Patient Assessment Part 2:  Have you MISSED ANY DOSES or CHANGED TABLETS?  No missed Warfarin doses or changed tablets.  Have you had any BRUISING or BLEEDING ( nose or gum bleeds,blood in urine or stool)?  No reported bruising or bleeding in nose, gums, urine, stool.  Have you STARTED or STOPPED any MEDICATIONS, including OTC meds,herbals or supplements?  No other medications or herbal supplements were started or stopped.  Have you CHANGED your DIET, especially green vegetables,or ALCOHOL intake?  No changes in diet or alcohol intake.  Have you had any ILLNESSES or HOSPITALIZATIONS?  No reported illnesses or hospitalizations  Have you had any signs of CLOTTING?(chest discomfort,dizziness,shortness of breath,arms tingling,slurred speech,swelling or redness in leg)    No chest discomfort, dizziness, shortness of breath, tingling in arm, slurred speech, swelling, or redness in leg.     Treatment  Target INR: 2.0-3.0 INR: 2.1  Date: 04/26/2007 Regimen In:  25.0mg /week INR reflects regimen in: 2.1  New  Tablet strength: : 5mg  Regimen Out:     Sunday: 1 Tablet     Monday: 1  Tablet     Tuesday: 1/2 Tablet     Wednesday: 1 Tablet     Thursday: 1/2 Tablet      Friday: 1 Tablet     Saturday: 1/2 Tablet Total Weekly: 27.5mg /week mg  Next INR Due: 05/24/2007 Adjusted by: [name]. Alexandria Lodge III PharmD CACP   Return to anticoagulation clinic:  05/24/2007 Time of next visit: 0845

## 2010-07-30 NOTE — Assessment & Plan Note (Signed)
Summary: [facility]madin Clinic  Anticoagulant Therapy Managed by: [name]. Janie Morning  PharmD CACP Referring MD: Dr. [name] PCP: Dr. [name] [facility] Attending: Eliseo Gum MD Indication 1: Atrial fibrillation Indication 2: Aftercare long term use Anticoagulants V58.61,V58.83 Start date: 09/18/2006 Duration: Indefinite  Patient Assessment Reviewed by: [name] PharmD  January 04, 2007 Medication review: verified warfarin dosage & schedule,verified previous prescription medications, verified doses & any changes, verified new medications, reviewed OTC medications, reviewed OTC health products-vitamins supplements etc Complications: none Dietary changes: none   Health status changes: none   Lifestyle changes: none   Recent/future hospitalizations: none   Recent/future procedures: none   Recent/future dental: none Patient Assessment Part 2:  Have you MISSED ANY DOSES or CHANGED TABLETS?  No missed Warfarin doses or changed tablets.  Have you had any BRUISING or BLEEDING ( nose or gum bleeds,blood in urine or stool)?  No reported bruising or bleeding in nose, gums, urine, stool.  Have you STARTED or STOPPED any MEDICATIONS, including OTC meds,herbals or supplements?  No other medications or herbal supplements were started or stopped.  Have you CHANGED your DIET, especially green vegetables,or ALCOHOL intake?  No changes in diet or alcohol intake.  Have you had any ILLNESSES or HOSPITALIZATIONS?  No reported illnesses or hospitalizations  Have you had any signs of CLOTTING?(chest discomfort,dizziness,shortness of breath,arms tingling,slurred speech,swelling or redness in leg)    No chest discomfort, dizziness, shortness of breath, tingling in arm, slurred speech, swelling, or redness in leg.     Treatment  Target INR: 2.0-3.0 INR: 3.4  Date: 01/04/2007 Regimen In:  32.5mg /week INR reflects regimen in: 3.4  New  Tablet strength: : 5mg  Regimen Out:     Sunday: 1 Tablet  Monday: 1 Tablet     Tuesday: 1 Tablet     Wednesday: 1/2 Tablet     Thursday: 1 Tablet      Friday: 1 Tablet     Saturday: 1 Tablet Total Weekly: 32.5mg /week mg  Next INR Due: 01/18/2007 Adjusted by: [name]. Alexandria Lodge III PharmD CACP   Return to anticoagulation clinic:  01/18/2007 Time of next visit: 0845

## 2010-07-30 NOTE — Assessment & Plan Note (Signed)
Summary: COU/CH  Anticoagulant Therapy Managed by: [name]. Janie Morning  PharmD CACP Referring MD: Dr. [name] PCP: Dr. [name] [facility] Attending: Eliseo Gum MD Indication 1: Atrial fibrillation Indication 2: Aftercare long term use Anticoagulants V58.61,V58.83 Start date: 09/18/2006 Duration: Indefinite  Patient Assessment Reviewed by: [name] PharmD  October 25, 2007 Medication review: verified warfarin dosage & schedule,verified previous prescription medications, verified doses & any changes, verified new medications, reviewed OTC medications, reviewed OTC health products-vitamins supplements etc Complications: none Dietary changes: none   Health status changes: none   Lifestyle changes: none   Recent/future hospitalizations: none   Recent/future procedures: none   Recent/future dental: none Patient Assessment Part 2:  Have you MISSED ANY DOSES or CHANGED TABLETS?  No missed Warfarin doses or changed tablets.  Have you had any BRUISING or BLEEDING ( nose or gum bleeds,blood in urine or stool)?  No reported bruising or bleeding in nose, gums, urine, stool.  Have you STARTED or STOPPED any MEDICATIONS, including OTC meds,herbals or supplements?  No other medications or herbal supplements were started or stopped.  Have you CHANGED your DIET, especially green vegetables,or ALCOHOL intake?  No changes in diet or alcohol intake.  Have you had any ILLNESSES or HOSPITALIZATIONS?  No reported illnesses or hospitalizations  Have you had any signs of CLOTTING?(chest discomfort,dizziness,shortness of breath,arms tingling,slurred speech,swelling or redness in leg)    No chest discomfort, dizziness, shortness of breath, tingling in arm, slurred speech, swelling, or redness in leg.     Treatment  Target INR: 2.0-3.0 INR: 2.5  Date: 10/25/2007 Regimen In:  22.5mg /week INR reflects regimen in: 2.5  New  Tablet strength: : 5mg  Regimen Out:     Sunday: 1/2 Tablet  Monday: 1 Tablet     Tuesday: 1/2 Tablet     Wednesday: 1/2 Tablet     Thursday: 1 Tablet      Friday: 1/2 Tablet     Saturday: 1/2 Tablet Total Weekly: 22.5mg /week mg  Next INR Due: 11/22/2007 Adjusted by: [name]. Alexandria Lodge III PharmD CACP   Return to anticoagulation clinic:  11/22/2007 Time of next visit: 0845          Appended Document: COU/CH After patient had left, I was apprised that the scheduled appointment for 25-May-09 will fall upon a day/date [facility] Day) for which the clinic is closed. Patient has been called and rescheduled for 1-Jun-09.

## 2010-07-30 NOTE — Assessment & Plan Note (Signed)
Summary: Coumadin Clinic  Anticoagulant Therapy Managed by: [name]. Janie Morning  PharmD CACP Referring MD: Dr. [name] PCP: Dr. [name] [facility] Attending: Eliseo Gum MD Indication 1: Atrial fibrillation Indication 2: Aftercare long term use Anticoagulants V58.61,V58.83 Start date: 09/18/2006 Duration: Indefinite  Patient Assessment Reviewed by: [name] PharmD  August 04, 2007 Medication review: verified warfarin dosage & schedule,verified previous prescription medications, verified doses & any changes, verified new medications, reviewed OTC medications, reviewed OTC health products-vitamins supplements etc Complications: none Dietary changes: none   Health status changes: none   Lifestyle changes: none   Recent/future hospitalizations: none   Recent/future procedures: none   Recent/future dental: none Patient Assessment Part 2:  Have you MISSED ANY DOSES or CHANGED TABLETS?  No missed Warfarin doses or changed tablets.  Have you had any BRUISING or BLEEDING ( nose or gum bleeds,blood in urine or stool)?  No reported bruising or bleeding in nose, gums, urine, stool.  Have you STARTED or STOPPED any MEDICATIONS, including OTC meds,herbals or supplements?  No other medications or herbal supplements were started or stopped.  Have you CHANGED your DIET, especially green vegetables,or ALCOHOL intake?  No changes in diet or alcohol intake.  Have you had any ILLNESSES or HOSPITALIZATIONS?  No reported illnesses or hospitalizations  Have you had any signs of CLOTTING?(chest discomfort,dizziness,shortness of breath,arms tingling,slurred speech,swelling or redness in leg)    No chest discomfort, dizziness, shortness of breath, tingling in arm, slurred speech, swelling, or redness in leg.     Treatment  Target INR: 2.0-3.0 INR: 7.0  Date: 08/04/2007 Regimen In:  25.0mg /week INR reflects regimen in: 7.0  New  Tablet strength: : 5mg  Regimen Out:     Thursday: None  Tablet      Friday: INR Tablet  Total Weekly: 25.0mg /week mg  Next INR Due: 08/06/2007 Adjusted by: [name]. Alexandria Lodge III PharmD CACP   Return to anticoagulation clinic:  08/06/2007  Hold:  2 Days    Comments: Patient states that he has been taking a FULL TABLET (5mg ) by mouth once daily INSTEAD OF per the instructions ( 1/2 x 5mg  = 2.5mg ) on FOUR DAYS OF THE WEEK (and 1 tablet = 5mg  on 3 days of the week). Patient has been taking 35mg  per week. He had been instructed to take 25mg  per week. I will HOLD/OMIT

## 2010-07-30 NOTE — Assessment & Plan Note (Signed)
Summary: COU/VS  Anticoagulant Therapy Managed by: [name]. Janie Morning  PharmD CACP Referring MD: Dr. [name] PCP: Dr. [name] [facility] Attending: Eliseo Gum MD Indication 1: Atrial fibrillation Indication 2: Aftercare long term use Anticoagulants V58.61,V58.83 Start date: 09/18/2006 Duration: Indefinite  Patient Assessment Reviewed by: [name] PharmD  January 04, 2007 Medication review: verified warfarin dosage & schedule,verified previous prescription medications, verified doses & any changes, verified new medications, reviewed OTC medications, reviewed OTC health products-vitamins supplements etc Complications: none Dietary changes: none   Health status changes: none   Lifestyle changes: none   Recent/future hospitalizations: none   Recent/future procedures: none   Recent/future dental: none Patient Assessment Part 2:  Have you MISSED ANY DOSES or CHANGED TABLETS?  No missed Warfarin doses or changed tablets.  Have you had any BRUISING or BLEEDING ( nose or gum bleeds,blood in urine or stool)?  No reported bruising or bleeding in nose, gums, urine, stool.  Have you STARTED or STOPPED any MEDICATIONS, including OTC meds,herbals or supplements?  No other medications or herbal supplements were started or stopped.  Have you CHANGED your DIET, especially green vegetables,or ALCOHOL intake?  No changes in diet or alcohol intake.  Have you had any ILLNESSES or HOSPITALIZATIONS?  No reported illnesses or hospitalizations  Have you had any signs of CLOTTING?(chest discomfort,dizziness,shortness of breath,arms tingling,slurred speech,swelling or redness in leg)    No chest discomfort, dizziness, shortness of breath, tingling in arm, slurred speech, swelling, or redness in leg.     Treatment  Target INR: 2.0-3.0 INR: 3.4  Date: 01/04/2007 Regimen In:  35.0mg /week INR reflects regimen in: 3.4  New  Tablet strength: : 5mg  Regimen Out:     Sunday: 1 Tablet     Monday: 1  Tablet     Tuesday: 1 Tablet     Wednesday: 1/2 Tablet     Thursday: 1 Tablet      Friday: 1 Tablet     Saturday: 1 Tablet Total Weekly: 32.5mg /week mg  Next INR Due: 01/18/2007 Adjusted by: [name]. Alexandria Lodge III PharmD CACP   Return to anticoagulation clinic:  01/18/2007 Time of next visit: 0845

## 2010-07-30 NOTE — Assessment & Plan Note (Signed)
Summary: 261/DS  Anticoagulant Therapy Managed by: [name]. [name]  PharmD CACP Referring MD: Dr. Olga Frazier PCP: Dr. Liliane Frazier [facility] Attending: Margarito Liner MD Indication 1: Atrial fibrillation Indication 2: Aftercare long term use Anticoagulants V58.61,V58.83 Start date: 09/18/2006  Patient Assessment Reviewed by: [name] PharmD  November 30, 2006 Medication review: verified warfarin dosage & schedule,verified previous prescription medications, verified doses & any changes, verified new medications, reviewed OTC medications, reviewed OTC health products-vitamins supplements etc Complications: none Comments: Patient states he has started on amiodarone 200mg  two times a day  per Dr. Jens Frazier.  This has impacted upon his INR responsiveness. Patient has taken todays dose of warfarin, as such--will OMIT Tuesday's dose, give 1/2 tablet (2.5mg ) on Wednesday, then 1 tablet daily, and will see in clinic on Tuesday December 08, 2006.  Patient of Dr. Liliane Frazier and Dr. Jens Frazier. Dietary changes: none   Health status changes: none   Lifestyle changes: none   Recent/future hospitalizations: none   Recent/future procedures: none   Recent/future dental: none         Have you MISSED ANY DOSES or CHANGED TABLETS?  No missed Warfarin doses or changed tablets.  Have you had any BRUISING or BLEEDING ( nose or gum bleeds,blood in urine or stool)?   No reported bruising or bleeding in nose, gums, urine, stool.   Have you STARTED or STOPPED any MEDICATIONS, including OTC meds,herbals or supplements?  Yes, has commenced amiodarone 200mg  1 tablet two times a day    Have you CHANGED your DIET, especially green vegetables,or ALCOHOL intake?   No changes in diet or alcohol intake.  Have you had any ILLNESSES or HOSPITALIZATIONS?    No reported illnesses or hospitalizations   Have you had any signs of CLOTTING?(chest discomfort,dizziness,shortness of breath,arms tingling,slurred speech,swelling or redness in leg)      No chest discomfort, dizziness, shortness of breath, tingling in arm, slurred speech, swelling, or redness in leg.   Target INR: 2.0-3.0 INR: 4.7  Date: 11/30/2006  INR reflects current regimen: 4.7   New  Tablet strength: : 5mg  Regimen Out:     Sunday: 1 Tablet     Monday: 1 Tablet     Tuesday: 0 Tablet     Wednesday: 1/2 Tablet     Thursday: 1 Tablet      Friday: 1 Tablet     Saturday: 1 Tablet Total Weekly: 27.5mg /week mg  Next INR Due: 12/08/2006 Adjusted by: [name]. Alexandria Lodge III PharmD CACP   Return to anticoagulation clinic:  12/08/2006  Hold:  1 Days    Comments: INR 4.7 today, Monday June 2  reflects regimen of 37.5mg /wk. Has taken today's dose.  Will OMIT Tuesday's dose, then give 1/2 tablet (2.5mg ) on Wednesday, then one tablet daily until next INR.

## 2010-07-30 NOTE — Assessment & Plan Note (Signed)
Summary: COU/CH  Anticoagulant Therapy Managed by: [name]. Janie Morning  PharmD CACP Referring MD: Dr. [name] PCP: Dr. [name] [facility] Attending: [name] Indication 1: Atrial fibrillation Indication 2: Aftercare long term use Anticoagulants V58.61,V58.83 Start date: 09/18/2006 Duration: Indefinite  Patient Assessment Reviewed by: [name] PharmD  September 02, 2007 Medication review: verified warfarin dosage & schedule,verified previous prescription medications, verified doses & any changes, verified new medications, reviewed OTC medications, reviewed OTC health products-vitamins supplements etc Complications: none, hemorrhagic Comments: States had 1 episode of epistaxis this morning after blowing his nose. It stopped with pressure. Dietary changes: none   Health status changes: none   Lifestyle changes: none   Recent/future hospitalizations: none   Recent/future procedures: yes  Details: States is going to have to have surgery upon his right shoulder due to a work related injury/rotator cuff repair he states. States has been advised to d/c warfarin 5 days prior to surgery. He is pending approval from his cardiologist for medical clearance as well as his insuror. When it is determined when he will have surgery, will provide him instructions regarding discontinuation around the planned surgery. Recent/future dental: none Patient Assessment Part 2:  Have you MISSED ANY DOSES or CHANGED TABLETS?  No missed Warfarin doses or changed tablets.  Have you had any BRUISING or BLEEDING ( nose or gum bleeds,blood in urine or stool)?  Yes. Reports epistaxis that commenced after blowing his nose this morning that subsided after holding pressure x 15 mins he states.  Have you STARTED or STOPPED any MEDICATIONS, including OTC meds,herbals or supplements?  No other medications or herbal supplements were started or stopped.  Have you CHANGED your DIET, especially green vegetables,or ALCOHOL  intake?  No changes in diet or alcohol intake.  Have you had any ILLNESSES or HOSPITALIZATIONS?  No reported illnesses or hospitalizations  Have you had any signs of CLOTTING?(chest discomfort,dizziness,shortness of breath,arms tingling,slurred speech,swelling or redness in leg)    No chest discomfort, dizziness, shortness of breath, tingling in arm, slurred speech, swelling, or redness in leg.     Treatment  Target INR: 2.0-3.0 INR: 4.2  Date: 09/02/2007 Regimen In:  27.5mg /week INR reflects regimen in: 4.2  New  Tablet strength: : 5mg  Regimen Out:     Sunday: 1/2 Tablet     Monday: 1 Tablet     Tuesday: 1/2 Tablet     Wednesday: 1 Tablet     Thursday: 1/2 Tablet      Friday: 1/2 Tablet     Saturday: 1/2 Tablet Total Weekly: 22.5mg /week mg  Next INR Due: 09/13/2007 Adjusted by: [name]. Alexandria Lodge III PharmD CACP   Return to anticoagulation clinic:  09/13/2007  Hold:  1 Days

## 2010-07-30 NOTE — Assessment & Plan Note (Signed)
Summary: COU/VS  Anticoagulant Therapy Managed by: [name]. [name]  PharmD CACP Referring MD: Dr. [name] PCP: Dr. [name] [facility] Attending: [name] MD Indication 1: Atrial fibrillation Indication 2: Aftercare long term use Anticoagulants V58.61,V58.83 Start date: 09/18/2006 Duration: Indefinite  Patient Assessment Reviewed by: [name] PharmD  May 24, 2007 Medication review: verified warfarin dosage & schedule,verified previous prescription medications, verified doses & any changes, verified new medications, reviewed OTC medications, reviewed OTC health products-vitamins supplements etc Complications: none Dietary changes: none   Health status changes: none   Lifestyle changes: none   Recent/future hospitalizations: none   Recent/future procedures: none   Recent/future dental: none Patient Assessment Part 2:  Have you MISSED ANY DOSES or CHANGED TABLETS?  No missed Warfarin doses or changed tablets.  Have you had any BRUISING or BLEEDING ( nose or gum bleeds,blood in urine or stool)?  No reported bruising or bleeding in nose, gums, urine, stool.  Have you STARTED or STOPPED any MEDICATIONS, including OTC meds,herbals or supplements?  No other medications or herbal supplements were started or stopped.  Have you CHANGED your DIET, especially green vegetables,or ALCOHOL intake?  No changes in diet or alcohol intake.  Have you had any ILLNESSES or HOSPITALIZATIONS?  No reported illnesses or hospitalizations  Have you had any signs of CLOTTING?(chest discomfort,dizziness,shortness of breath,arms tingling,slurred speech,swelling or redness in leg)    No chest discomfort, dizziness, shortness of breath, tingling in arm, slurred speech, swelling, or redness in leg.     Treatment  Target INR: 2.0-3.0 INR: 2.5  Date: 05/24/2007 Regimen In:  27.5mg /week INR reflects regimen in: 2.5  New  Tablet strength: : 5mg  Regimen Out:     Sunday: 1 Tablet     Monday:  1 Tablet     Tuesday: 1/2 Tablet     Wednesday: 1 Tablet     Thursday: 1/2 Tablet      Friday: 1 Tablet     Saturday: 1/2 Tablet Total Weekly: 27.5mg /week mg  Next INR Due: 06/28/2007 Adjusted by: [name]. Alexandria Lodge III PharmD CACP   Return to anticoagulation clinic:  06/28/2007 Time of next visit: 0900

## 2010-07-30 NOTE — Miscellaneous (Signed)
  Clinical Lists Changes  Medications: Removed medication of WARFARIN SODIUM  TABS (WARFARIN SODIUM TABS) Tablet Strength: 5mg Take as directed 

## 2010-07-30 NOTE — Assessment & Plan Note (Signed)
Summary: [facility]  Anticoagulant Therapy Managed by: [name]. Janie Morning  PharmD CACP Referring MD: Dr. [name] PCP: Dr. [name] [facility] Attending: [name] MD Indication 1: Atrial fibrillation Indication 2: Aftercare long term use Anticoagulants V58.61,V58.83 Start date: 09/18/2006 Duration: Indefinite Reviewed by: [name] PharmD  February 18, 2007 Patient Assessment Part 2:  Have you MISSED ANY DOSES or CHANGED TABLETS?  No missed Warfarin doses or changed tablets.  Have you had any BRUISING or BLEEDING ( nose or gum bleeds,blood in urine or stool)?  No reported bruising or bleeding in nose, gums, urine, stool.  Have you STARTED or STOPPED any MEDICATIONS, including OTC meds,herbals or supplements?  No other medications or herbal supplements were started or stopped.  Have you CHANGED your DIET, especially green vegetables,or ALCOHOL intake?  No changes in diet or alcohol intake.  Have you had any ILLNESSES or HOSPITALIZATIONS?  No reported illnesses or hospitalizations  Have you had any signs of CLOTTING?(chest discomfort,dizziness,shortness of breath,arms tingling,slurred speech,swelling or redness in leg)    No chest discomfort, dizziness, shortness of breath, tingling in arm, slurred speech, swelling, or redness in leg.     Treatment  Target INR: 2.0-3.0 INR: 2.7  Date: 02/18/2007 Regimen In:  25.0mg /week INR reflects regimen in: 2.7  New  Tablet strength: : 2.5mg  Regimen Out:     Sunday: 1 & 1/2 Tablet     Monday: 1 & 1/2 Tablet     Tuesday: 1 & 1/2 Tablet     Wednesday: 1 Tablet     Thursday: 1 & 1/2 Tablet      Friday: 1 & 1/2 Tablet     Saturday: 1 & 1/2 Tablet Total Weekly: 25.0mg /week mg  Next INR Due: 03/22/2007 Adjusted by: [name]. Alexandria Lodge III PharmD CACP   Return to anticoagulation clinic:  03/22/2007 Time of next visit: 0845     Prescriptions: COUMADIN 2.5 MG TAB (WARFARIN SODIUM) Take as directed.  #30 x 2   Entered by:   [name] PharmD   Authorized by:   [name] MD   Signed by:   [name] PharmD on 02/18/2007   Method used:   Print then Give to Patient   RxID:   [id number]

## 2010-07-30 NOTE — Consult Note (Signed)
Summary: [facility]  [facility]   Imported By: Louretta Parma 05/30/2010 16:36:02  _____________________________________________________________________  External Attachment:    Type:   Image     Comment:   External Document

## 2010-07-30 NOTE — Assessment & Plan Note (Signed)
Summary: CHECKUP/SB.   Vital Signs:  Patient profile:   65 year old male Height:      65 inches (165.10 cm) Weight:      182.2 pounds (82.82 kg) BMI:     30.43 Temp:     97.4 degrees F (36.33 degrees C) oral Pulse rate:   81 / minute Pulse (ortho):   101 / minute BP sitting:   108 / 65  (right arm) BP standing:   103 / 66  Vitals Entered By: Krystal Eaton Duncan Dull) (May 07, 2009 1:56 PM)  Serial Vital Signs/Assessments:  Time      Position  BP       Pulse  Resp  Temp     By           Lying RA  116/66   80                    Kay Goldston,CMA (AAMA)           Sitting   114/72   89                    Kay Goldston,CMA (AAMA)           Standing  103/66   101                   Kay Goldston,CMA (AAMA)  CC: here for routine f/u, c/o pain radiating down both legs Is Patient Diabetic? No Pain Assessment Patient in pain? no      Nutritional Status BMI of > 30 = obese  Have you ever been in a relationship where you felt threatened, hurt or afraid?No   Does patient need assistance? Functional Status Self care Ambulation Normal   Primary Care Provider:  Dr. [name]  CC:  here for routine f/u and c/o pain radiating down both legs.  History of Present Illness: [name] is a 65 year old Male with PMH/problems as outlined in the EMR, who presents to the [facility] with chief complaint(s)   1. Dizzy spells: Has had a few dizzy spells in the past months. He gets these spells when he bends down to pick something up. It goes away after he gets up. Associated with some hearing loss, but no tinnitus, headache, focal weakness, numbness. No seizures, loss of consc, visual deficits, but he does have hearing loss that has been going for a long time.   2. Leg pain: having leg pains associated with his back pain. He recently had a CT myelogram done by orthopedics, and he was asked to contact us to see if there is anything else. He has DJD and has needed steroid injections in the past. Leg pains  affect both legs both at rest and during exertion. Pain starts from the back and moves down to his legs.   3. Erectile dysfunction: this problem has been going on for sometime now, at least a year or so. He wonders whether it is related to one of his meds. He does get erection, but he has problem sustaining.     Preventive Screening-Counseling & Management  Alcohol-Tobacco     Smoking Status: quit     Smoking Cessation Counseling: yes     Packs/Day: 10-12     Year Quit: 2009  Allergies: 1)  ! Prednisone  Past History:  Past Medical History: Last updated: 01/18/2008 Alcoholic Cardiomyopathy:Non ischemic,dilated  Ef 25-30%(10/07) per cath                                           Cardiac cath 10/07 showing LAD 60-70%,OM 40%,RCA 30-40% Atrial fibrillation:Dx 10/07                          Not a candidate for anticoagulation secondary to ETOH use and Gastric ulcer(ASA) Cirrhosis of liver secondary to ETOH abuse Iron deficiency Anemia-secondary to gastritis and gastic ulcer Tobacco Abuse H/o Gastric ulcer:seen on EGD by Dr.Hung (10/07) H/o ETOH use now quit since 10/07 Right peripheral Bell's Palsy  Risk Factors: Smoking Status: quit (05/07/2009) Packs/Day: 10-12 (05/07/2009)  Social History: Smoking Status:  quit  Review of Systems       As per HPI  Physical Exam  General:  alert and well-developed.   Head:  normocephalic and atraumatic.   Eyes:  vision grossly intact, pupils equal, pupils round, and pupils reactive to light.   Ears:  Normal tympanic membranes bilaterally.  Nose:  no external deformity.   Mouth:  pharynx pink and moist, no erythema, and no exudates.   Neck:  supple.   Lungs:  normal respiratory effort and normal breath sounds.   Heart:  normal rate and regular rhythm.   Abdomen:  soft and non-tender.   Msk:  Mild lower spinal tenderness, no active signs of infection, inflammation Pulses:  normal peripheral  pulses  Extremities:  no cyanosis, clubbing or edema  Neurologic:  alert & oriented X3, cranial nerves II-XII intact, strength normal in all extremities, and sensation intact to light touch.     Impression & Recommendations:  Problem # 1:  DIZZINESS (ICD-780.4) Positional nature goes in favor of Benign positional vertigo, but he does have hearing loss and with h/o a fib / cardiomyopathy and tobacco abuse, he does have risks for stroke as well. I will go ahead get a brain MRI study and review. Will go ahead with stroke work up versus ENT eval based on MRI finding. I will also check orthostatic vital on him.   Problem # 2:  LEG PAIN (ICD-729.5) He has good bilateral peripheral pulses. I doubt this is vascular; it is more likely related to his back condition. Won't get ABI today, but will review on next visit. Continue with aspirin for now.   Problem # 3:  ERECTILE DYSFUNCTION, SECONDARY TO MEDICATION (KGM-010.27) This is most likely related to his meds esp. beta blocker; a vascular cause is a possibility as well, but like noted above he has good peripheral pulses. I will touch base with Dr. [name] if there is anything we can switch him to.   Problem # 4:  CARDIOMYOPATHY (ICD-425.4)  stable currently.   Orders: T-Basic Metabolic Panel [phone]) MRI (MRI)  Complete Medication List: 1)  Coreg 12.5 Mg Tabs (Carvedilol) .... Take 1 tablet by mouth two times a day 2)  Lisinopril 20 Mg Tabs (Lisinopril) .Marland Kitchen.. 1 tab by mouth two times a day 3)  Albuterol 90 Mcg/act Aers (Albuterol) .... Inhale 1-2 puffs every 4-6 hours when needed 4)  Sm Aspirin Ec 325 Mg Tbec (Aspirin) .... Take 1 tablet by mouth once a day 5)  Lipitor 40 Mg Tabs (Atorvastatin calcium) .... Take 1 tablet by mouth once a day 6)  Prednisone 10 Mg Tabs (Prednisone) .... Take 6 pills  a day for 5 days then take 5 pills on day 6, 4 pills on day 7, 3 pills on day 8, 2 pills on day 9, and 1 pill on day 10 7)  Artificial Tears  0.1-0.3 % Soln (Artificial tear solution) .... Place one to two drops in your right eye every hour while you are awake 8)  [facility] Eye Lubricant Nighttime Oint (White petrolatum-mineral oil) .... Apply 1/4 to 1/2 inch under the right lower eyelid at night prior to going to sleep  Patient Instructions: 1)  Please schedule a follow-up appointment in 1 month. 2)  We will let you know if anything wrong with your lab work.   Process Orders Check Orders Results:     [facility]: ABN not required for this insurance Tests Sent for requisitioning (May 07, 2009 4:52 PM):     05/07/2009: [facility] -- T-Basic Metabolic Panel [phone] (signed)     Prevention & Chronic Care Immunizations   Influenza vaccine: Not documented    Tetanus booster: Not documented    Pneumococcal vaccine: Not documented    H. zoster vaccine: Not documented  Colorectal Screening   Hemoccult: Positive  (04/07/2006)    Colonoscopy: Normal  (04/09/2006)  Other Screening   PSA: Not documented   Smoking status: quit  (05/07/2009)  Lipids   Total Cholesterol: 100  (09/07/2008)   LDL: 62  (09/07/2008)   LDL Direct: 89.6  (03/03/2007)   HDL: 23.6  (09/07/2008)   Triglycerides: 71  (09/07/2008)   Process Orders Check Orders Results:     [facility]: ABN not required for this insurance Tests Sent for requisitioning (May 07, 2009 4:52 PM):     05/07/2009: [facility] -- T-Basic Metabolic Panel [id number] (signed)

## 2010-07-30 NOTE — Assessment & Plan Note (Signed)
Summary: COUMADIN/VS  Anticoagulant Therapy Managed by: [name]. Janie Morning  PharmD CACP Referring MD: Dr. [name] PCP: Dr. [name] [facility] Attending: [name] Indication 1: Atrial fibrillation Indication 2: Aftercare long term use Anticoagulants V58.61 Start date: 09/18/2006  Patient Assessment Reviewed by: [name] PharmD  October 12, 2006 Medication review: verified warfarin dosage & schedule,verified previous prescription medications, verified doses & any changes, verified new medications, reviewed OTC medications, reviewed OTC health products-vitamins supplements etc Complications: none Dietary changes: none   Health status changes: none   Lifestyle changes: none   Recent/future hospitalizations: none   Recent/future procedures: none   Recent/future dental: none         Have you MISSED ANY DOSES or CHANGED TABLETS?  No missed Warfarin doses or changed tablets.  Have you had any BRUISING or BLEEDING ( nose or gum bleeds,blood in urine or stool)?   No reported bruising or bleeding in nose, gums, urine, stool.   Have you STARTED or STOPPED any MEDICATIONS, including OTC meds,herbals or supplements?  No other medications or herbal supplements were started or stopped.   Have you CHANGED your DIET, especially green vegetables,or ALCOHOL intake?   No changes in diet or alcohol intake.  Have you had any ILLNESSES or HOSPITALIZATIONS?    No reported illnesses or hospitalizations   Have you had any signs of CLOTTING?(chest discomfort,dizziness,shortness of breath,arms tingling,slurred speech,swelling or redness in leg)    No chest discomfort, dizziness, shortness of breath, tingling in arm, slurred speech, swelling, or redness in leg.   Target INR: 2.0-3.0 INR: 3.0  Date: 10/12/2006  INR reflects current regimen: 3.0   New  Tablet strength: : 5mg  Regimen Out:     Sunday: 1 Tablet     Monday: 1 Tablet     Tuesday: 1 Tablet     Wednesday: 1/2 Tablet     Thursday: 1  Tablet      Friday: 1 Tablet     Saturday: 1 Tablet Total Weekly: 32.5mg /week mg  Next INR Due: 10/19/2006 Adjusted by: [name]. Alexandria Lodge III PharmD CACP   Return to anticoagulation clinic:  10/19/2006   Comments: This is week 3 of 4 for which the INR has been therapeutic. Awaiting 4 of 4 consecutive weeks of therapeutic INR responsiveness.  Upon achieving this--I will contact [facility] to apprise them of same--so that they may schedule patient for elective cardioversion by Dr. [name].

## 2010-07-30 NOTE — Progress Notes (Signed)
Summary: change chantix/ hla  Phone Note From Pharmacy   Summary of Call: please change chantix from starter pack to the continuing month treatment pack, mc emp pharm notified per verbal from you Initial call taken by: Marin Roberts RN,  May 13, 2010 2:20 PM  Follow-up for Phone Call        Pharmacist called Follow-up by: Julaine Fusi  DO,  May 20, 2010 4:06 PM

## 2010-07-30 NOTE — Assessment & Plan Note (Signed)
Summary: ?BELLS PALSEY/EST/VS   Vital Signs:  Patient Profile:   65 Years Old Male Height:     65 inches (165.10 cm) Weight:      183.8 pounds BMI:     30.70 Temp:     97.0 degrees F oral Pulse rate:   69 / minute BP sitting:   121 / 73  (right arm)  Pt. in pain?   yes    Location:   eye    Intensity:   3  Vitals Entered By: Filomena Jungling NT II (January 18, 2008 10:01 AM)              Is Patient Diabetic? No Nutritional Status BMI of 25 - 29 = overweight  Have you ever been in a relationship where you felt threatened, hurt or afraid?No   Does patient need assistance? Functional Status Self care Ambulation Normal     PCP:  Dr. [name]  Chief Complaint:  ?BELLS PALSEY.  History of Present Illness: Pt is a 65 year old man with PMH significant for tobacco abuse, ETOH abuse, cardiomyopathy, cirrhosis, and COPD who presents to clinic for possible Bell's palsy.  Pt reports that Sunday night/Monday morning he noticed that he could not close his right eye all the way and the right side of his face was not working.  Pt also reports that he pulled several ticks off of him between March and May but only one was attached.  He denies any rashes, other neuro findings, new myalgias, herpes lesions, change in his hearing, N/V/D/C, fevers, chills, CP, SOB, hematuria, dysuria, melena, hematochezia, prior similar episodes, any pain, travel up [place], or any other findings.  No other complaints.     Prior Medications Reviewed Using: Patient Recall  Updated Prior Medication List: Prednisone does not appear to be a true allergy.  Current Allergies (reviewed today): ! PREDNISONE  Past Medical History:    Alcoholic Cardiomyopathy:Non ischemic,dilated                                              Ef 25-30%(10/07) per cath                                              Cardiac cath 10/07 showing LAD 60-70%,OM 40%,RCA 30-40%    Atrial fibrillation:Dx 10/07                             Not a candidate for  anticoagulation secondary to ETOH use and Gastric ulcer(ASA)    Cirrhosis of liver secondary to ETOH abuse    Iron deficiency Anemia-secondary to gastritis and gastic ulcer    Tobacco Abuse    H/o Gastric ulcer:seen on EGD by Dr.Hung (10/07)    H/o ETOH use now quit since 10/07    Right peripheral Bell's Palsy    Risk Factors: Tobacco use:  current    Cigarettes:  Yes -- 10-12 pack(s) per day  Colonoscopy History:    Date of Last Colonoscopy:  04/09/2006   Review of Systems       SEE HPI   Physical Exam  General:     alert, well-developed, well-nourished, well-hydrated, and overweight-appearing.   Head:  normocephalic and atraumatic.   Eyes:     vision grossly intact, pupils equal, pupils round, pupils reactive to light, and pupils react to accomodation.  Pt with inability to keep right eye closed.  Ears:     R ear normal and L ear normal.   Mouth:     pharynx pink and moist.   Neck:     supple, no JVD, no cervical lymphadenopathy, and no neck tenderness.   Lungs:     normal respiratory effort, no intercostal retractions, no accessory muscle use, normal breath sounds, no crackles, and no wheezes.   Heart:     normal rate, regular rhythm, no gallop, no rub, no JVD, and Grade  2 /6 systolic ejection murmur.   Abdomen:     soft, non-tender, and normal bowel sounds.   Msk:     normal ROM, no joint tenderness, no joint swelling, no joint warmth, and no redness over joints.   Extremities:     No clubbing, cyanosis, edema, or deformity noted with normal full range of motion of all joints.   Neurologic:     alert & oriented X3, strength normal in all extremities, sensation intact to light touch, sensation intact to pinprick, gait normal, DTRs symmetrical and normal, finger-to-nose normal, heel-to-shin normal, toes down bilaterally on Babinski, and Romberg negative. Pt does have impaired right facial nerve as mentioned in HPI.  Skin:     turgor normal, color normal, no  rashes, no suspicious lesions, no ecchymoses, no petechiae, no ulcerations, and no edema.   Cervical Nodes:     no anterior cervical adenopathy and no posterior cervical adenopathy.   Psych:     Oriented X3, normally interactive, good eye contact, not anxious appearing, and not depressed appearing.      Impression & Recommendations:  Problem # 1:  BELL'S PALSY, RIGHT (ICD-351.0) Will put pt on 10 day course of prednisone. Will start with 60mg  daily for five days then will do a five day taper starting with 50mg  daily.  Will not start anti-virals due to lack of good clinical evidence for its benefit.  Pt does not appear to have any signs of herpes involvement, now or in the past.  Will also have pt use artificial tears in the right eye every hour while he is awake and will have him use lacrilube ointment at night.  Pt instructed to call the clinic or go to the ER if any new symptoms or worsening of his old ones occurs.  Will not do imaging at this point, but will keep in mind if symptoms progress or he fails to improve.  There is no current indication for central involvement.    Problem # 2:  CARDIOMYOPATHY (ICD-425.4) Stable.  Per CARDS.   Problem # 3:  ATRIAL FIBRILLATION (ICD-427.31) In sinus.  Per cards.  The following medications were removed from the medication list:    Amiodarone Hcl 200 Mg Tabs (Amiodarone hcl) .Marland Kitchen... 1 tablet two times a day    Coumadin 2.5 Mg Tab (Warfarin sodium) .Marland Kitchen... Take as directed.  His updated medication list for this problem includes:    Coreg 12.5 Mg Tabs (Carvedilol) .Marland Kitchen... Take 1 tablet by mouth two times a day    Sm Aspirin Ec 325 Mg Tbec (Aspirin) .Marland Kitchen... Take 1 tablet by mouth once a day   Problem # 4:  COPD (ICD-496) Stable.  I have encouraged pt to restart his nicotine patch and to stop smoking.   His updated medication  list for this problem includes:    Albuterol 90 Mcg/act Aers (Albuterol) ..... Inhale 1-2 puffs every 4-6 hours when  needed   Problem # 5:  ALCOHOL ABUSE (ICD-305.00) Pt stopped drinking several years ago and has not restarted.    Complete Medication List: 1)  Coreg 12.5 Mg Tabs (Carvedilol) .... Take 1 tablet by mouth two times a day 2)  Lisinopril 20 Mg Tabs (Lisinopril) .... Take 1 tablet by mouth once a day 3)  Albuterol 90 Mcg/act Aers (Albuterol) .... Inhale 1-2 puffs every 4-6 hours when needed 4)  Sm Aspirin Ec 325 Mg Tbec (Aspirin) .... Take 1 tablet by mouth once a day 5)  Lipitor 40 Mg Tabs (Atorvastatin calcium) .... Take 1 tablet by mouth once a day 6)  Prednisone 10 Mg Tabs (Prednisone) .... Take 6 pills a day for 5 days then take 5 pills on day 6, 4 pills on day 7, 3 pills on day 8, 2 pills on day 9, and 1 pill on day 10 7)  Artificial Tears 0.1-0.3 % Soln (Artificial tear solution) .... Place one to two drops in your right eye every hour while you are awake 8)  [facility] Eye Lubricant Nighttime Oint (White petrolatum-mineral oil) .... Apply 1/4 to 1/2 inch under the right lower eyelid at night prior to going to sleep   Patient Instructions: 1)  Please schedule a follow-up appointment in 2 weeks. 2)  Please take the Prednisone 10mg  pills as follows:  3)  Take 6 pills a day for the first five days, then take 5 pills on day 6, 4 pills on day 7, 3 pills on day 8, 2 pills on day 9, and one pill on day 10. 4)  Please put one to two drops of the artificial tears in your right eye every hour while your are awake. 5)  Prior to going to bed put 1/4 to 1/2 inches of the ointment under your right lower eye lid.   6)  If you have any problems with the medications or start having new symptoms call the clinic or go to the emergency room.     Prescriptions: [facility] EYE LUBRICANT NIGHTTIME   OINT (WHITE PETROLATUM-MINERAL OIL) Apply 1/4 to 1/2 inch under the right lower eyelid at night prior to going to sleep  #1 month x 5   Entered and Authorized by:   Rufina Falco MD   Signed by:   Rufina Falco MD on  01/18/2008   Method used:   Print then Give to Patient   RxID:   [id number] ARTIFICIAL TEARS 0.1-0.3 %  SOLN (ARTIFICIAL TEAR SOLUTION) Place one to two drops in your right eye every hour while you are awake  #1 month x 5   Entered and Authorized by:   Rufina Falco MD   Signed by:   Rufina Falco MD on 01/18/2008   Method used:   Print then Give to Patient   RxID:   [id number] PREDNISONE 10 MG  TABS (PREDNISONE) Take 6 pills a day for 5 days THEN take 5 pills on day 6, 4 pills on day 7, 3 pills on day 8, 2 pills on day 9, and 1 pill on day 10  #45 x 0   Entered and Authorized by:   Rufina Falco MD   Signed by:   Rufina Falco MD on 01/18/2008   Method used:   Print then Give to Patient   RxID:   [id number] PREDNISONE 10 MG  TABS (PREDNISONE)  Take 6 pills a day for 5 days THEN take 5 pills on day 6, 4 pills on day 7, 3 pills on day 8, 2 pills on day 9, and 1 pill on day 10  #0 x 0   Entered and Authorized by:   Rufina Falco MD   Signed by:   Rufina Falco MD on 01/18/2008   Method used:   Print then Give to Patient   RxID:   [id number] CV[facility] EYE LUBRICANT NIGHTTIME   OINT (WHITE PETROLATUM-MINERAL OIL) Apply 1/4 to 1/2 inch under the right lower eyelid at night prior to going to sleep  #1 month x 5   Entered and Authorized by:   Rufina Falco MD   Signed by:   Rufina Falco MD on 01/18/2008   Method used:   Print then Give to Patient   RxID:   [id number] ARTIFICIAL TEARS 0.1-0.3 %  SOLN (ARTIFICIAL TEAR SOLUTION) Place one to two drops in your right eye every hour while you are awake  #1 month x 5   Entered and Authorized by:   Rufina Falco MD   Signed by:   Rufina Falco MD on 01/18/2008   Method used:   Print then Give to Patient   RxID:   [id number]  ]

## 2010-07-30 NOTE — Assessment & Plan Note (Signed)
Summary: PER DR Ellis Savage UP/MEDS/[facility]   Vital Signs:  Patient Profile:   65 Years Old Male Height:     65 inches (165.10 cm) Weight:      179.3 pounds (81.50 kg) BMI:     29.94 Temp:     97.4 degrees F (36.33 degrees C) Pulse rate:   66 / minute BP sitting:   128 / 75  (left arm)  Pt. in pain?   yes    Location:   right shoulder    Intensity:   0  Vitals Entered By: Dorie Rank RN (January 07, 2007 9:21 AM)              Is Patient Diabetic? No Nutritional Status BMI of 25 - 29 = overweight  Does patient need assistance? Functional Status Self care Ambulation Normal Comments has already cut back on smoking and interested in cutting back further   PCP:  Dr. [name]  Chief Complaint:  get established - "6 month check up"- sees De. Crenshaw.  History of Present Illness: Pt is a 65 year old CM with PMH significant for dilated cardiomyopathy, A fib (status post cardioversion in May 08), Cirrhosis secondary to ETOH abuse, iron deficiency anemia secondary to gastritis/gastric ulcer, COPD, and tobacco abuse presenting to clinic for follow up of labs and a check up.  Pt complaints of right shoulder pain while raising his arm over his head.  He reports that he has been taking tylenol for this pain with good result.  Pt was told he could take an additional 500mg  of tylenol for pain.  Pt was told to not take NSAIDS secondary to increased risk of bleeding secondary to being on coumadin.  Pt may need an xray of shoulder and possible MRI if pain persist.  Pt also complains of some Erectile dysfunction which is likely secondary to his b-blocker. Pt has discussed this with Dr. [name] who is supposed to be getting back to him about this.  Pt was told to rediscuss with Dr. [name].  Pt is not on a nitro.  Pt also mentioned some mild wheezing while outside.  Pt has not been taking his albuterol.  No complaints of wheezing at this time.   Current Allergies (reviewed today): !  PREDNISONE    Risk Factors:  Tobacco use:  current    Cigarettes:  Yes -- 10-12 pack(s) per day    Counseled to quit/cut down tobacco use:  yes  Colonoscopy History:    Date of Last Colonoscopy:  04/09/2006   Review of Systems       See HPI   Physical Exam  General:     alert, well-developed, and well-nourished.   Head:     normocephalic and atraumatic.   Neck:     supple.   Lungs:     normal respiratory effort, no intercostal retractions, no accessory muscle use, normal breath sounds, no crackles, and no wheezes.   Heart:     Pt has a murmur best heard at the right sternal border. normal rate, regular rhythm, no gallop, no rub, and no JVD.   Abdomen:     soft, non-tender, and normal bowel sounds.   Msk:     normal ROM.  Pt with joint tenderness with elevation of right shoulder over his head.  Extremities:     No clubbing, cyanosis, edema, or deformity noted with normal full range of motion of all joints.   Neurologic:     alert & oriented X3.  Skin:     color normal, no rashes, and no suspicious lesions.   Psych:     Oriented X3.      Impression & Recommendations:  Problem # 1:  CARDIOMYOPATHY (ICD-425.4) No signs of heart failure.  Will continue current treatment regimen.  Medications include coreg, lisinopril, amiodarone, and lasix.  Pt is no longer taking Digoxin per Cardiology.  Pts last 2 d echo was much improved with a normal EF.   Problem # 2:  ATRIAL FIBRILLATION (ICD-427.31) Pt is currently in A. fibrillation but his rate is well controlled.  Will continue his current treatment. Of note pt is no longer taking digoxin, which I will take off of his medication list.  His updated medication list for this problem includes:      Coreg 12.5 Mg Tabs (Carvedilol) .Marland Kitchen... Take 1 tablet by mouth two times a day    Digoxin 0.125 Mg Tabs (Digoxin) .Marland Kitchen... Take 1 tablet by mouth once a day    Amiodarone Hcl 200 Mg Tabs (Amiodarone hcl) .Marland Kitchen... 1 tablet two times a day     Warfarin Sodium Tabs (Warfarin sodium tabs) .Marland Kitchen... Tablet strength: 5mg  take as directed  The following medications were removed from the medication list:    Digoxin 0.125 Mg Tabs (Digoxin) .Marland Kitchen... Take 1 tablet by mouth once a day  His updated medication list for this problem includes:    Coreg 12.5 Mg Tabs (Carvedilol) .Marland Kitchen... Take 1 tablet by mouth two times a day    Amiodarone Hcl 200 Mg Tabs (Amiodarone hcl) .Marland Kitchen... 1 tablet two times a day    Warfarin Sodium Tabs (Warfarin sodium tabs) .Marland Kitchen... Tablet strength: 5mg  take as directed   Problem # 3:  ANEMIA-IRON DEFICIENCY (ICD-280.9) Will check a CBC at follow up visit in three months.  Future Orders: T-CBC No Diff ([id number]) ... 04/09/2007   Problem # 4:  TOBACCO ABUSE (ICD-305.1) Pt counselled on need to stop smoking.  He is still smoking a 1/2 pack per day.  He is no longer taking the Nicoderm.  I will remove from his medication list.  His updated medication list for this problem includes:    Nicoderm Cq 21 Mg/24hr Pt24 (Nicotine) ..... Use as directed   Problem # 5:  COPD (ICD-496) Pt reported some wheezing.  He has not been taking his albuterol.  He currently has no wheezing and is not in any respiratory distress.  Pt was encouraged to use his albuterol inhaler as needed, to stop smoking, and to try to stay out of the heat as much as possible.  His updated medication list for this problem includes:    Albuterol 90 Mcg/act Aers (Albuterol) ..... Inhale 1-2 puffs every 4-6 hours when needed   Problem # 6:  CIRRHOSIS (ICD-571.5) Pts last CMET was normal.  His updated medication list for this problem includes:    Coreg 12.5 Mg Tabs (Carvedilol) .Marland Kitchen... Take 1 tablet by mouth two times a day    Lasix 40 Mg Tabs (Furosemide) .Marland Kitchen... Take 1 tablet by mouth once a day   Problem # 7:  Preventive Health Care (ICD-V70.0) Pt's last FLP was normal.  No medication needed at this time.  Problem # 8:  SHOULDER PAIN, RIGHT (ICD-719.41) Pt is  complaining of right shoulder pain with elevation of arm above his head.  Pt denies any trauma a reports that the pain is well controlled for the most part with tylenol.  He wishes to continue with the tylenol.  I  have told him he make take a total of two grams of tylenol per day given his history of cirrhosis.  Pt is to come back if he continues to have pain. He reports that he may have slept wrong on it.  Pt may warrant an xray or MRI if pain persists.  ? arthritis vs rotator cuff injury vs etc...    Patient Instructions: 1)  Please schedule a follow-up appointment in 3 months. 2)  Your cholesterol and your liver enzymes are currently normal.  Please take your medications as prescribed.  Please take the albuterol if you have wheezing.  The last 2 D echo you had of your heart was improved so continue to follow with your Cardiologist.  You may take ibuprofen for your shoulder pain.  If the pain persists we will need to do an xray of it.   3)  Tobacco is very bad for your health and your loved ones! You Should stop smoking!.    Prescriptions: ALBUTEROL 90 MCG/ACT AERS (ALBUTEROL) Inhale 1-2 puffs every 4-6 hours when needed  #1 month x 5   Entered and Authorized by:   Rufina Falco MD   Signed by:   Rufina Falco MD on 01/07/2007   Method used:   Print then Give to Patient   RxID:   [id number]       Appended Document: lab orders added [facility]

## 2010-07-30 NOTE — Assessment & Plan Note (Signed)
Summary: COUMADIN PT COMING 8:30AM/VS  Anticoagulant Therapy Managed by: [name]. Janie Morning  PharmD CACP Referring MD: Dr. [name] PCP: Dr. [name] Indication 1: Atrial fibrillation Indication 2: Aftercare long term use Anticoagulants V58.61 Start date: 09/18/2006  Patient Assessment Reviewed by: [name] PharmD  October 19, 2006 Medication review: verified warfarin dosage & schedule,verified previous prescription medications, verified doses & any changes, verified new medications, reviewed OTC medications, reviewed OTC health products-vitamins supplements etc Complications: none Dietary changes: none   Health status changes: none   Lifestyle changes: none   Recent/future hospitalizations: none   Recent/future procedures: none   Recent/future dental: none         Have you MISSED ANY DOSES or CHANGED TABLETS?  No missed Warfarin doses or changed tablets.  Have you had any BRUISING or BLEEDING ( nose or gum bleeds,blood in urine or stool)?   No reported bruising or bleeding in nose, gums, urine, stool.   Have you STARTED or STOPPED any MEDICATIONS, including OTC meds,herbals or supplements?  No other medications or herbal supplements were started or stopped.   Have you CHANGED your DIET, especially green vegetables,or ALCOHOL intake?   No changes in diet or alcohol intake.  Have you had any ILLNESSES or HOSPITALIZATIONS?    No reported illnesses or hospitalizations   Have you had any signs of CLOTTING?(chest discomfort,dizziness,shortness of breath,arms tingling,slurred speech,swelling or redness in leg)    No chest discomfort, dizziness, shortness of breath, tingling in arm, slurred speech, swelling, or redness in leg.   Target INR: 2.0-3.0 INR: 2.6  Date: 10/19/2006  INR reflects current regimen: 2.6   New  Tablet strength: : 5mg  Regimen Out:     Sunday: 1 Tablet     Monday: 1 Tablet     Tuesday: 1 Tablet     Wednesday: 1 Tablet     Thursday: 1 Tablet      Friday:  1 Tablet     Saturday: 1 Tablet Total Weekly: 35.0mg /week mg  Next INR Due: 10/26/2006 Adjusted by: [name]. Alexandria Lodge III PharmD CACP   Return to anticoagulation clinic:  10/26/2006   Comments: Has had 4 consecutive therapeutic INR's while readying/awaiting this dose response for cardioversion by Dr. [name].  I called [facility] [phone] and spoke with Thayer Ohm to alert him of this and to have someone call patient and schedule cardioversion.

## 2010-07-30 NOTE — Assessment & Plan Note (Signed)
Summary: RA/FU VISIT/DS   Vital Signs:  Patient profile:   65 year old male Height:      65 inches Weight:      183.06 pounds BMI:     30.57 Temp:     98.8 degrees F oral Pulse rate:   73 / minute Resp:     16 per minute BP sitting:   129 / 82  (left arm)  Vitals Entered By: Filomena Jungling NT II (April 17, 2010 3:38 PM)  Nutrition Counseling: Patient's BMI is greater than 25 and therefore counseled on weight management options. CC: needs rx for chantrix, ?warts / will come in am for fasting labs Is Patient Diabetic? No Pain Assessment Patient in pain? no      Nutritional Status BMI of > 30 = obese  Have you ever been in a relationship where you felt threatened, hurt or afraid?No   Does patient need assistance? Functional Status Self care, Shopping Ambulation Normal   Primary Care Provider:  Almyra Deforest MD  CC:  needs rx for chantrix and ?warts / will come in am for fasting labs.  History of Present Illness: 65 y/o m with h/o ischemic+non ischemic cardiomyopathy, HTN, HLd comes for routine check up of his HTN, HLD - complaints of pappular lesions on is arms that has been there all his life. had got some removed as a child, no recurrence where he got them removed. no pain, dishcarge, no change in shape, color or appearance lately. has similar lesion on his legs and also somebumps on penis that has been there for >10 yrs.  - has hearing loss since years, has been working in Education officer, environmental area for a long time. was turned down from [employer] service for the same. had audiometry in the hospital for as a part of employment and was told that he is a candidiate for hearing aid   Preventive Screening-Counseling & Management  Alcohol-Tobacco     Smoking Status: current     Smoking Cessation Counseling: yes     Packs/Day: 10-12     Year Quit: 2009  Current Medications (verified): 1)  Coreg 12.5 Mg Tabs (Carvedilol) .... Take 1 Tablet By Mouth Two Times A Day 2)  Lisinopril 20  Mg  Tabs (Lisinopril) .Marland Kitchen.. 1 Tab By Mouth Two Times A Day 3)  Sm Aspirin Ec 325 Mg  Tbec (Aspirin) .... Take 1 Tablet By Mouth Once A Day 4)  Lipitor 40 Mg  Tabs (Atorvastatin Calcium) .... Take 1 Tablet By Mouth Once A Day 5)  Viagra 25 Mg Tabs (Sildenafil Citrate) .... Take One Pill 30 Min To 1 Hour Befor Sexual Intercourse. You Can Take A Maximum To Two Pill At A Time. 6)  Acetaminophen 500 Mg  Caps (Acetaminophen) .... As Needed  Allergies (verified): 1)  ! Prednisone  Review of Systems       The patient complains of decreased hearing and genital sores.  The patient denies anorexia, fever, weight loss, weight gain, vision loss, hoarseness, chest pain, syncope, dyspnea on exertion, peripheral edema, prolonged cough, headaches, hemoptysis, abdominal pain, melena, hematochezia, severe indigestion/heartburn, hematuria, incontinence, muscle weakness, suspicious skin lesions, transient blindness, difficulty walking, depression, unusual weight change, abnormal bleeding, enlarged lymph nodes, angioedema, breast masses, and testicular masses.    Physical Exam  General:  Gen: VS reveiwed, Alert, well developed, nodistress ENT: mucous membranes pink & moist. No abnormal finds in ear and nose. CVC:S1 S2 , no murmurs, no abnormal heart sounds. Lungs: Clear to auscultation  B/L. No wheezes, crackles or other abnormal sounds Abdomen: soft, non distended, no tender. Normal Bowel sounds EXT: no pitting edema, no engorged veins, Pulsations normal  Neuro:alert, oriented *3, cranial nerved 2-12 intact, strenght normal in all  extremities, senstations normal to light touch.    Skin:  wart(s) on arms b/l, legs, and glans penis Cervical Nodes:  no anterior cervical adenopathy and no posterior cervical adenopathy.   Axillary Nodes:  no R axillary adenopathy and no L axillary adenopathy.   Inguinal Nodes:  no R inguinal adenopathy and no L inguinal adenopathy.     Impression & Recommendations:  Problem # 1:   NOISE-INDUCED HEARING LOSS (ICD-388.12) conductive hearing loss had audiometry in the hosptial but do not have results will refer to ENT for assessment for hearing aid  Orders: ENT Referral (ENT)  Problem # 2:  SKIN LESION (ICD-709.9) skin warts,  but lesions on penis are concerning.   Orders: Dermatology Referral (Derma)  Problem # 3:  HYPERLIPIDEMIA (ICD-272.4) FLP tomorrow  His updated medication list for this problem includes:    Lipitor 40 Mg Tabs (Atorvastatin calcium) .Marland Kitchen... Take 1 tablet by mouth once a day  Future Orders: T-Lipid Profile ([id number]) ... 04/18/2010  Problem # 4:  CAD (ICD-414.00) stable on medical Mx Dr. [name] following  His updated medication list for this problem includes:    Coreg 12.5 Mg Tabs (Carvedilol) .Marland Kitchen... Take 1 tablet by mouth two times a day    Lisinopril 20 Mg Tabs (Lisinopril) .Marland Kitchen... 1 tab by mouth two times a day    Sm Aspirin Ec 325 Mg Tbec (Aspirin) .Marland Kitchen... Take 1 tablet by mouth once a day  Future Orders: T-CMP with Estimated GFR ([id number]) ... 04/18/2010  Complete Medication List: 1)  Coreg 12.5 Mg Tabs (Carvedilol) .... Take 1 tablet by mouth two times a day 2)  Lisinopril 20 Mg Tabs (Lisinopril) .Marland Kitchen.. 1 tab by mouth two times a day 3)  Sm Aspirin Ec 325 Mg Tbec (Aspirin) .... Take 1 tablet by mouth once a day 4)  Lipitor 40 Mg Tabs (Atorvastatin calcium) .... Take 1 tablet by mouth once a day 5)  Viagra 25 Mg Tabs (Sildenafil citrate) .... Take one pill 30 min to 1 hour befor sexual intercourse. you can take a maximum to two pill at a time. 6)  Acetaminophen 500 Mg Caps (Acetaminophen) .... As needed 7)  Chantix Starting Month Pak 0.5 Mg X 11 & 1 Mg X 42 Tabs (Varenicline tartrate) .... Use as directed  Patient Instructions: 1)  Please schedule a follow-up appointment in 6 months. Prescriptions: CHANTIX STARTING MONTH PAK 0.5 MG X 11 & 1 MG X 42 TABS (VARENICLINE TARTRATE) use as directed  #1 x 0   Entered and Authorized  by:   Bethel Born MD   Signed by:   Bethel Born MD on 04/17/2010   Method used:   Electronically to        [facility]* (retail)       [address]       [address] Shipping/mailing       [place], [place]  [address]       Ph: [phone]       Fax: [phone]   RxID:   [id number]    Orders Added: 1)  T-Lipid Profile [id number] 2)  T-CMP with Estimated GFR [[id number]] 3)  Dermatology Referral [Derma] 4)  ENT Referral [ENT] 5)  Est. Patient Level IV [60109]  Process Orders Check Orders Results:     [facility]: ABN not required for this insurance Tests Sent for requisitioning (April 17, 2010 5:09 PM):     04/18/2010: [facility] -- T-Lipid Profile [phone] (signed)     04/18/2010: [facility] -- T-CMP with Estimated GFR [[id number]] (signed)

## 2010-07-30 NOTE — Letter (Signed)
Summary: [facility]: Home Visit Report  [facility]: Home Visit Report   Imported By: Florinda Marker 01/13/2007 15:01:28  _____________________________________________________________________  External Attachment:    Type:   Image     Comment:   External Document

## 2010-07-30 NOTE — Assessment & Plan Note (Signed)
Summary: COU/CH  Anticoagulant Therapy Managed by: [name]. Janie Morning  PharmD CACP Referring MD: Dr. [name] PCP: Dr. [name] [facility] Attending: Eliseo Gum MD Indication 1: Atrial fibrillation Indication 2: Aftercare long term use Anticoagulants V58.61,V58.83 Start date: 09/18/2006 Duration: Indefinite  Patient Assessment Reviewed by: [name] PharmD  August 06, 2007 Medication review: verified warfarin dosage & schedule,verified previous prescription medications, verified doses & any changes, verified new medications, reviewed OTC medications, reviewed OTC health products-vitamins supplements etc Complications: none Dietary changes: none   Health status changes: none   Lifestyle changes: none   Recent/future hospitalizations: none   Recent/future procedures: none   Recent/future dental: none Patient Assessment Part 2:  Have you MISSED ANY DOSES or CHANGED TABLETS?  No missed Warfarin doses or changed tablets.  Have you had any BRUISING or BLEEDING ( nose or gum bleeds,blood in urine or stool)?  No reported bruising or bleeding in nose, gums, urine, stool.  Have you STARTED or STOPPED any MEDICATIONS, including OTC meds,herbals or supplements?  No other medications or herbal supplements were started or stopped.  Have you CHANGED your DIET, especially green vegetables,or ALCOHOL intake?  No changes in diet or alcohol intake.  Have you had any ILLNESSES or HOSPITALIZATIONS?  No reported illnesses or hospitalizations  Have you had any signs of CLOTTING?(chest discomfort,dizziness,shortness of breath,arms tingling,slurred speech,swelling or redness in leg)    No chest discomfort, dizziness, shortness of breath, tingling in arm, slurred speech, swelling, or redness in leg.     Treatment  Target INR: 2.0-3.0 INR: 3.6  Date: 08/06/2007 Regimen In:  22.5mg /week INR reflects regimen in: 3.6  New  Tablet strength: : 5mg  Regimen Out:     Sunday: 1/2 Tablet  Monday: 1 Tablet     Tuesday: 1/2 Tablet     Wednesday: 1 Tablet     Thursday: 1/2 Tablet      Friday: 1 Tablet     Saturday: 1/2 Tablet Total Weekly: 25.0mg /week mg  Next INR Due: 08/09/2007 Adjusted by: [name]. Alexandria Lodge III PharmD CACP   Return to anticoagulation clinic:  08/09/2007 Time of next visit: 1100  Hold:  1 Days    Comments: Has drifted down from INR on 4-Feb-09 to today to INR = 3.6 Will OMIT todays dose, resume on his normally scheduled 25mg /wk dose (he had been taking his warfarin incorrectly he states). WIll see him on Monday in [facility] for f/u of his INR.

## 2010-07-30 NOTE — Miscellaneous (Signed)
Summary: [facility]  [facility]   Imported By: Florinda Marker 04/22/2007 15:55:41  _____________________________________________________________________  External Attachment:    Type:   Image     Comment:   External Document

## 2010-07-30 NOTE — Assessment & Plan Note (Signed)
Summary: 261  Anticoagulant Therapy Managed by: [name]. Janie Morning  PharmD CACP Referring MD: Dr. [name] PCP: Dr. [name] [facility] Attending: Eliseo Gum MD Indication 1: Atrial fibrillation Indication 2: Aftercare long term use Anticoagulants V58.61,V58.83 Start date: 09/18/2006 Duration: Indefinite  Patient Assessment Reviewed by: [name] PharmD  August 04, 2007 Medication review: verified warfarin dosage & schedule,verified previous prescription medications, verified doses & any changes, verified new medications, reviewed OTC medications, reviewed OTC health products-vitamins supplements etc Complications: none Dietary changes: none   Health status changes: none   Lifestyle changes: none   Recent/future hospitalizations: none   Recent/future procedures: none   Recent/future dental: none Patient Assessment Part 2:  Have you MISSED ANY DOSES or CHANGED TABLETS?  No missed Warfarin doses or changed tablets.  Have you had any BRUISING or BLEEDING ( nose or gum bleeds,blood in urine or stool)?  No reported bruising or bleeding in nose, gums, urine, stool.  Have you STARTED or STOPPED any MEDICATIONS, including OTC meds,herbals or supplements?  No other medications or herbal supplements were started or stopped.  Have you CHANGED your DIET, especially green vegetables,or ALCOHOL intake?  No changes in diet or alcohol intake.  Have you had any ILLNESSES or HOSPITALIZATIONS?  No reported illnesses or hospitalizations  Have you had any signs of CLOTTING?(chest discomfort,dizziness,shortness of breath,arms tingling,slurred speech,swelling or redness in leg)    No chest discomfort, dizziness, shortness of breath, tingling in arm, slurred speech, swelling, or redness in leg.     Treatment  Target INR: 2.0-3.0 INR: 7.0  Date: 08/04/2007 Regimen In:  25.0mg /week INR reflects regimen in: 7.0  New  Tablet strength: : 5mg  Regimen Out:     Sunday: 1 Tablet     Monday:  0 Tablet     Tuesday: 1 Tablet     Wednesday: 1/2 Tablet     Thursday: 1 Tablet      Friday: 0 Tablet     Saturday: 1 Tablet Total Weekly: 22.5mg /week mg  Next INR Due: 08/06/2007   Return to anticoagulation clinic:  08/06/2007  Hold:  2 Days    Comments: Patient has been taking 35mg /wk instead of 25mg /wk. He was supposed to have been taking 1/2 of a 5mg  tablet (2.5mg )  warfarin by mouth on 4 days of the week and 1x 5mg  warfarin tablet on 3 days of the week. He was given printed instructions at last visit as documented in the doseresponse system. He will have his dose HELD/OMITTED x 2 doses/days and RTC on Friday for repeat INR.

## 2010-07-30 NOTE — Assessment & Plan Note (Signed)
Summary: [facility]  Anticoagulant Therapy Managed by: [name]. [name]  PharmD CACP Referring MD: Dr. Olga Frazier PCP: Dr. Liliane Frazier [facility] Attending: [name] MD Indication 1: Atrial fibrillation Indication 2: Aftercare long term use Anticoagulants V58.61 Start date: 09/18/2006  Patient Assessment Reviewed by: [name] PharmD  Nov 11, 2006 Medication review: verified warfarin dosage & schedule,verified previous prescription medications, verified doses & any changes, verified new medications, reviewed OTC medications, reviewed OTC health products-vitamins supplements etc Complications: none Dietary changes: none   Health status changes: none   Lifestyle changes: none   Recent/future hospitalizations: none   Recent/future procedures: none   Recent/future dental: none         Have you MISSED ANY DOSES or CHANGED TABLETS?  No missed Warfarin doses or changed tablets.  Have you had any BRUISING or BLEEDING ( nose or gum bleeds,blood in urine or stool)?   No reported bruising or bleeding in nose, gums, urine, stool.   Have you STARTED or STOPPED any MEDICATIONS, including OTC meds,herbals or supplements?  No other medications or herbal supplements were started or stopped.   Have you CHANGED your DIET, especially green vegetables,or ALCOHOL intake?   No changes in diet or alcohol intake.  Have you had any ILLNESSES or HOSPITALIZATIONS?    No reported illnesses or hospitalizations   Have you had any signs of CLOTTING?(chest discomfort,dizziness,shortness of breath,arms tingling,slurred speech,swelling or redness in leg)    No chest discomfort, dizziness, shortness of breath, tingling in arm, slurred speech, swelling, or redness in leg.   Target INR: 2.0-3.0 INR: 2.5  Date: 11/10/2006  INR reflects current regimen: 2.5 INR: 2.5   New  Tablet strength: : 5mg  Regimen Out:     Sunday: 1 Tablet     Monday: 1 Tablet     Tuesday: 1 Tablet     Wednesday: 1 Tablet  Thursday: 1 Tablet      Friday: 1 Tablet     Saturday: 1 Tablet Total Weekly: 35.0mg /week mg  Next INR Due: 11/17/2006 Adjusted by: [name]. Alexandria Lodge III PharmD CACP   Return to anticoagulation clinic:  11/17/2006   Comments: INR on 13-May-08 reflects 35mg /week

## 2010-07-30 NOTE — Assessment & Plan Note (Signed)
Summary: 261  Anticoagulant Therapy Managed by: [name]. Janie Morning  PharmD CACP Referring MD: Dr. [name] PCP: Dr. [name] [facility] Attending: Duncan Dull MD Indication 1: Atrial fibrillation Indication 2: Aftercare long term use Anticoagulants V58.61 Start date: 09/18/2006  Patient Assessment Reviewed by: [name] PharmD  October 27, 2006 Medication review: verified warfarin dosage & schedule,verified previous prescription medications, verified doses & any changes, verified new medications, reviewed OTC medications, reviewed OTC health products-vitamins supplements etc Complications: none Dietary changes: none   Health status changes: none   Lifestyle changes: none   Recent/future hospitalizations: none   Recent/future procedures: none   Recent/future dental: none         Have you MISSED ANY DOSES or CHANGED TABLETS?  No missed Warfarin doses or changed tablets.  Have you had any BRUISING or BLEEDING ( nose or gum bleeds,blood in urine or stool)?   No reported bruising or bleeding in nose, gums, urine, stool.   Have you STARTED or STOPPED any MEDICATIONS, including OTC meds,herbals or supplements?  No other medications or herbal supplements were started or stopped.   Have you CHANGED your DIET, especially green vegetables,or ALCOHOL intake?   No changes in diet or alcohol intake.  Have you had any ILLNESSES or HOSPITALIZATIONS?    No reported illnesses or hospitalizations   Have you had any signs of CLOTTING?(chest discomfort,dizziness,shortness of breath,arms tingling,slurred speech,swelling or redness in leg)    No chest discomfort, dizziness, shortness of breath, tingling in arm, slurred speech, swelling, or redness in leg.   Target INR: 2.0-3.0 INR: 2.9  Date: 10/27/2006  INR reflects current regimen: 2.9   New  Tablet strength: : 5mg  Regimen Out:     Sunday: 1 Tablet     Monday: 1 Tablet     Tuesday: 1 Tablet     Wednesday: 1 Tablet     Thursday: 1 Tablet  Friday: 1 Tablet     Saturday: 1 Tablet Total Weekly: 35.0mg /week mg  Next INR Due: 11/03/2006 Adjusted by: [name]. Alexandria Lodge III PharmD CACP   Return to anticoagulation clinic:  11/03/2006   Comments: INR today reflects 35mg /wk. Patient is awaiting cardioversion per Dr. [name] at [facility].  Patient has had 4+consecutive therapeutic INRs. Information was conveyed to Shelby Dubin, PharmD on 21-Apr-08 by email and phone contact. I have faxed this information to them as well, today (29-Apr-08) to [phone], the number provided by Shelby Dubin, PharmD who manages their [facility].   Appended Document: 57 Agree with Dr. Saralyn Pilar management.

## 2010-07-30 NOTE — Miscellaneous (Signed)
Clinical Lists Changes  Medications: Added new medication of WARFARIN SODIUM  TABS (WARFARIN SODIUM TABS) Tablet Strength: 5mg  Take as directed Orders: Added new Service order of Fingerstick [phone]) - Signed Added new Service order of Protime INR ([id number]) - Signed Added new Service order of Est. Patient Level I (09811) - Signed Observations: Added new observation of NEXT PT: 10/12/2006 (10/05/2006 9:52) Added new observation of DOCTOR RN1: Angla Delahunt B. Idalys Konecny III PharmD CACP (10/05/2006 9:52) Added new observation of COUM TOT WK: 35.0mg /week (10/05/2006 9:52) Added new observation of SAT. DOSE: 1  (10/05/2006 9:52) Added new observation of FRIDAY DOSE: 1  (10/05/2006 9:52) Added new observation of THURS. DOSE: 1  (10/05/2006 9:52) Added new observation of WEDS. DOSE: 1  (10/05/2006 9:52) Added new observation of TUESDAY DOSE: 1  (10/05/2006 9:52) Added new observation of MONDAY DOSE: 1  (10/05/2006 9:52) Added new observation of SUNDAY DOSE: 1  (10/05/2006 9:52) Added new observation of COUM TAB MG: 5mg   (10/05/2006 9:52) Added new observation of INR: 2.4  (10/05/2006 9:52) Added new observation of INR RANGE: 2.0-3.0  (10/05/2006 9:52) Added new observation of SYMPTOMS: No chest discomfort, dizziness, shortness of breath, tingling in arm, slurred speech, swelling, or redness in leg.   (10/05/2006 9:52) Added new observation of HOSPITALIZA: No reported illnesses or hospitalizations  (10/05/2006 9:52) Added new observation of DIET PLAN: No changes in diet or alcohol intake.  (10/05/2006 9:52) Added new observation of MED CHANGE: No other medications or herbal supplements were started or stopped.  (10/05/2006 9:52) Added new observation of ABNORM BLEED: No reported bruising or bleeding in nose, gums, urine, stool.  (10/05/2006 9:52) Added new observation of COUMADIN CHG: No missed Warfarin doses or changed tablets.  (10/05/2006 9:52) Added new observation of ATTENDNG MD: [name]   (10/05/2006 9:52) Added new observation of COUMSTRTDTE: 09/18/2006  (10/05/2006 9:52) Added new observation of DIAGNOSIS C2: Aftercare long term use Anticoagulants V58.61  (10/05/2006 9:52) Added new observation of DIAGNOSIS C1: Atrial fibrillation  (10/05/2006 9:52) Added new observation of PRIMARY MD: Dr. [name]  (10/05/2006 9:52) Added new observation of REFER-MD: Dr. [name]  (10/05/2006 9:52) Added new observation of COAG MANG BY: Fayrene Fearing B. Alexandria Lodge III  PharmD CACP  (10/05/2006 9:52)    Anticoagulant Therapy Managed by: [name]. Janie Morning  PharmD CACP Referring MD: Dr. [name] PCP: Dr. [name] [facility] Attending: [name] Indication 1: Atrial fibrillation Indication 2: Aftercare long term use Anticoagulants V58.61 Start date: 09/18/2006  Patient Assessment Reviewed by: [name] PharmD  October 05, 2006 Medication review: verified warfarin dosage & schedule,verified previous prescription medications, verified doses & any changes, verified new medications, reviewed OTC medications, reviewed OTC health products-vitamins supplements etc Complications: none Dietary changes: none   Health status changes: none   Lifestyle changes: none   Recent/future hospitalizations: none   Recent/future procedures: none   Recent/future dental: none         Have you MISSED ANY DOSES or CHANGED TABLETS?  No missed Warfarin doses or changed tablets.  Have you had any BRUISING or BLEEDING ( nose or gum bleeds,blood in urine or stool)?   No reported bruising or bleeding in nose, gums, urine, stool.   Have you STARTED or STOPPED any MEDICATIONS, including OTC meds,herbals or supplements?  No other medications or herbal supplements were started or stopped.   Have you CHANGED your DIET, especially green vegetables,or ALCOHOL intake?   No changes in diet or alcohol intake.  Have you had  any ILLNESSES or HOSPITALIZATIONS?    No reported illnesses or hospitalizations   Have you had any  signs of CLOTTING?(chest discomfort,dizziness,shortness of breath,arms tingling,slurred speech,swelling or redness in leg)    No chest discomfort, dizziness, shortness of breath, tingling in arm, slurred speech, swelling, or redness in leg.   Target INR: 2.0-3.0 INR: 2.4  Date: 10/05/2006  INR reflects current regimen: 2.4   New  Tablet strength: : 5mg  Regimen Out:     Sunday: 1 Tablet     Monday: 1 Tablet     Tuesday: 1 Tablet     Wednesday: 1 Tablet     Thursday: 1 Tablet      Friday: 1 Tablet     Saturday: 1 Tablet Total Weekly: 35.0mg /week mg  Next INR Due: 10/12/2006 Adjusted by: [name]. Alexandria Lodge III PharmD CACP   Return to anticoagulation clinic:  10/12/2006

## 2010-07-30 NOTE — Progress Notes (Signed)
Summary: refill   Phone Note Refill Request Message from:  Patient on July 16, 2009 1:49 PM  Refills Requested: Medication #1:  LIPITOR 40 MG  TABS Take 1 tablet by mouth once a day  Medication #2:  ASPIRIN 325MG  [facility] [place]  [facility]  Initial call taken by: Judie Grieve,  July 16, 2009 1:49 PM    Prescriptions: SM ASPIRIN EC 325 MG  TBEC (ASPIRIN) Take 1 tablet by mouth once a day  #90 x 3   Entered by:   Kem Parkinson   Authorized by:   Ferman Hamming, MD, [facility]   Signed by:   Kem Parkinson on 07/16/2009   Method used:   Electronically to        [facility]* (retail)       [address]       [address]/mail[address]       [place], [place]  [address]       Ph: [phone]       Fax: [phone]   RxID:   [id number] LIPITOR 40 MG  TABS (ATORVASTATIN CALCIUM) Take 1 tablet by mouth once a day  #90 x 3   Entered by:   Kem Parkinson   Authorized by:   Ferman Hamming, MD, [facility]   Signed by:   Kem Parkinson on 07/16/2009   Method used:   Electronically to        [facility]* (retail)       [address]       [address] Shipping/mailing       [place], [place]  [address]       Ph: [phone]       Fax: [phone]   RxID:   [id number]

## 2010-07-30 NOTE — Assessment & Plan Note (Signed)
Summary: COU/VS  Anticoagulant Therapy Managed by: [name]. [name]  PharmD CACP Referring MD: Dr. [name] PCP: Dr. [name] [facility] Attending: Eliseo Gum MD Indication 1: Atrial fibrillation Indication 2: Aftercare long term use Anticoagulants V58.61,V58.83 Start date: 09/18/2006 Duration: Indefinite  Patient Assessment Reviewed by: [name] PharmD  August 09, 2007 Medication review: verified warfarin dosage & schedule,verified previous prescription medications, verified doses & any changes, verified new medications, reviewed OTC medications, reviewed OTC health products-vitamins supplements etc Complications: none Dietary changes: none   Health status changes: none   Lifestyle changes: none   Recent/future hospitalizations: none   Recent/future procedures: none   Recent/future dental: none Patient Assessment Part 2:  Have you MISSED ANY DOSES or CHANGED TABLETS?  No missed Warfarin doses or changed tablets.  Have you had any BRUISING or BLEEDING ( nose or gum bleeds,blood in urine or stool)?  No reported bruising or bleeding in nose, gums, urine, stool.  Have you STARTED or STOPPED any MEDICATIONS, including OTC meds,herbals or supplements?  No other medications or herbal supplements were started or stopped.  Have you CHANGED your DIET, especially green vegetables,or ALCOHOL intake?  No changes in diet or alcohol intake.  Have you had any ILLNESSES or HOSPITALIZATIONS?  No reported illnesses or hospitalizations  Have you had any signs of CLOTTING?(chest discomfort,dizziness,shortness of breath,arms tingling,slurred speech,swelling or redness in leg)    No chest discomfort, dizziness, shortness of breath, tingling in arm, slurred speech, swelling, or redness in leg.     Treatment  Target INR: 2.0-3.0 INR: 2.0  Date: 08/09/2007 Regimen In:  25.0mg /week INR reflects regimen in: 2.0  New  Tablet strength: : 5mg  Regimen Out:     Sunday: 1/2 Tablet  Monday: 1 Tablet     Tuesday: 1 Tablet     Wednesday: 1/2 Tablet     Thursday: 1 Tablet      Friday: 1/2 Tablet     Saturday: 1 Tablet Total Weekly: 27.5mg /week mg  Next INR Due: 08/30/2007 Adjusted by: [name]. Alexandria Lodge III PharmD CACP   Return to anticoagulation clinic:  08/30/2007 Time of next visit: 0845

## 2010-07-30 NOTE — Assessment & Plan Note (Signed)
Summary: [facility]  Anticoagulant Therapy Managed by: [name]. Janie Morning  PharmD CACP Referring MD: Dr. [name] PCP: Dr. [name] [facility] Attending: [name] MD Indication 1: Atrial fibrillation Indication 2: Aftercare long term use Anticoagulants V58.61,V58.83 Start date: 09/18/2006 Duration: Indefinite  Patient Assessment Reviewed by: [name] PharmD  March 23, 2007 Medication review: verified warfarin dosage & schedule,verified previous prescription medications, verified doses & any changes, verified new medications, reviewed OTC medications, reviewed OTC health products-vitamins supplements etc Complications: none Dietary changes: none   Health status changes: none   Lifestyle changes: none   Recent/future hospitalizations: none   Recent/future procedures: none   Recent/future dental: none Patient Assessment Part 2:  Have you MISSED ANY DOSES or CHANGED TABLETS?  No missed Warfarin doses or changed tablets.  Have you had any BRUISING or BLEEDING ( nose or gum bleeds,blood in urine or stool)?  No reported bruising or bleeding in nose, gums, urine, stool.  Have you STARTED or STOPPED any MEDICATIONS, including OTC meds,herbals or supplements?  No other medications or herbal supplements were started or stopped.  Have you CHANGED your DIET, especially green vegetables,or ALCOHOL intake?  No changes in diet or alcohol intake.  Have you had any ILLNESSES or HOSPITALIZATIONS?  No reported illnesses or hospitalizations  Have you had any signs of CLOTTING?(chest discomfort,dizziness,shortness of breath,arms tingling,slurred speech,swelling or redness in leg)    No chest discomfort, dizziness, shortness of breath, tingling in arm, slurred speech, swelling, or redness in leg.     Treatment  Target INR: 2.0-3.0 INR: 3.1  Date: 03/23/2007 Regimen In:  25.0mg /week INR reflects regimen in: 3.1  New  Tablet strength: : 5mg  Regimen Out:     Sunday: 1/2 Tablet  Monday: 1 Tablet     Tuesday: 1/2 Tablet     Wednesday: 1 Tablet     Thursday: 1/2 Tablet      Friday: 1 Tablet     Saturday: 1/2 Tablet Total Weekly: 25.0mg /week mg  Next INR Due: 04/19/2007 Adjusted by: [name]. Alexandria Lodge III PharmD CACP   Return to anticoagulation clinic:  04/19/2007 Time of next visit: 0830

## 2010-07-30 NOTE — Assessment & Plan Note (Signed)
Summary: Coumadin Clinic  Anticoagulant Therapy Managed by: [name]. [name]  PharmD CACP Referring MD: Dr. [name] PCP: Dr. [name] [facility] Attending: [name] Indication 1: Atrial fibrillation Indication 2: Aftercare long term use Anticoagulants V58.61,V58.83 Start date: 09/18/2006 Duration: Indefinite  Patient Assessment Reviewed by: [name] PharmD  December 01, 2007 Medication review: verified warfarin dosage & schedule,verified previous prescription medications, verified doses & any changes, verified new medications, reviewed OTC medications, reviewed OTC health products-vitamins supplements etc Complications: none Dietary changes: none   Health status changes: none   Lifestyle changes: none   Recent/future hospitalizations: none   Recent/future procedures: none   Recent/future dental: none Patient Assessment Part 2:  Have you MISSED ANY DOSES or CHANGED TABLETS?  No missed Warfarin doses or changed tablets.  Have you had any BRUISING or BLEEDING ( nose or gum bleeds,blood in urine or stool)?  No reported bruising or bleeding in nose, gums, urine, stool.  Have you STARTED or STOPPED any MEDICATIONS, including OTC meds,herbals or supplements?  No other medications or herbal supplements were started or stopped.  Have you CHANGED your DIET, especially green vegetables,or ALCOHOL intake?  No changes in diet or alcohol intake.  Have you had any ILLNESSES or HOSPITALIZATIONS?  No reported illnesses or hospitalizations  Have you had any signs of CLOTTING?(chest discomfort,dizziness,shortness of breath,arms tingling,slurred speech,swelling or redness in leg)    No chest discomfort, dizziness, shortness of breath, tingling in arm, slurred speech, swelling, or redness in leg.     Treatment  Target INR: 2.0-3.0 INR: 1.8  Date: 12/01/2007 Regimen In:  22.5mg /week INR reflects regimen in: 1.8  New  Tablet strength: : 5mg  Regimen Out:     Sunday: 1/2 Tablet   Monday: 1 Tablet     Tuesday: 1/2 Tablet     Wednesday: 1 Tablet     Thursday: 1 Tablet      Friday: 1/2 Tablet     Saturday: 1 Tablet Total Weekly: 27.5mg /week mg  Next INR Due: 01/03/2008 Adjusted by: [name]. Alexandria Lodge III PharmD CACP   Return to anticoagulation clinic:  01/03/2008 Time of next visit: 0830

## 2010-07-30 NOTE — Assessment & Plan Note (Signed)
Summary: 261/[facility]  Anticoagulant Therapy Managed by: [name]. [name]  PharmD CACP Referring MD: Dr. [name] PCP: Dr. [name] Indication 1: Atrial fibrillation Indication 2: Aftercare long term use Anticoagulants V58.61 Start date: 09/18/2006  Patient Assessment Reviewed by: [name] PharmD  September 28, 2006 Medication review: verified warfarin dosage & schedule,verified previous prescription medications, verified doses & any changes, verified new medications, reviewed OTC medications, reviewed OTC health products-vitamins supplements etc Complications: none Dietary changes: none   Health status changes: none   Lifestyle changes: none   Recent/future hospitalizations: none   Recent/future procedures: none   Recent/future dental: none         Have you MISSED ANY DOSES or CHANGED TABLETS?  No missed Warfarin doses or changed tablets.  Have you had any BRUISING or BLEEDING ( nose or gum bleeds,blood in urine or stool)?   No reported bruising or bleeding in nose, gums, urine, stool.   Have you STARTED or STOPPED any MEDICATIONS, including OTC meds,herbals or supplements?  No other medications or herbal supplements were started or stopped.   Have you CHANGED your DIET, especially green vegetables,or ALCOHOL intake?   No changes in diet or alcohol intake.  Have you had any ILLNESSES or HOSPITALIZATIONS?    No reported illnesses or hospitalizations   Have you had any signs of CLOTTING?(chest discomfort,dizziness,shortness of breath,arms tingling,slurred speech,swelling or redness in leg)    No chest discomfort, dizziness, shortness of breath, tingling in arm, slurred speech, swelling, or redness in leg.   Target INR: 2.0-3.0 INR: 2.4  Date: 09/28/2006  INR reflects current regimen: 2.4   New  Tablet strength: : 5mg  Regimen Out:     Sunday: 1 Tablet     Monday: 1 Tablet     Tuesday: 1 Tablet     Wednesday: 1/2 Tablet     Thursday: 1 Tablet      Friday: 1 Tablet  Saturday: 1 Tablet Total Weekly: 32.5mg /week mg  Next INR Due: 10/05/2006 Adjusted by: [name]. Alexandria Lodge III PharmD CACP   Return to anticoagulation clinic:  10/05/2006

## 2010-07-30 NOTE — Assessment & Plan Note (Signed)
Summary: YEARLY/SL  Medications Added ACETAMINOPHEN 500 MG  CAPS (ACETAMINOPHEN) as needed      Allergies Added:   Visit Type:  Follow-up Primary Provider:  Dr. [name]  CC:  no complaints.  History of Present Illness: Mr. Smead is a very pleasant gentleman with a history of nonischemic cardiomyopathy improved by most recent echocardiogram.  He also has a history of coronary artery disease with cardiomyopathy out of proportion; alcohol use, now resolved; and paroxysmal atrial fibrillation holding sinus rhythm.  Cardiac catheterization in October of 2007 showed a 20% left main, a 60% mid LAD and a 60-70% distal LAD. There was a 30-40% first diagonal. There was a 30% circumflex and a 40% OM. There was a 30-40% right coronary artery. Ejection fraction was 25-30%.  Last echocardiogram in March of 2010 showed an ejection fraction of 55-65%. There was very mild aortic stenosis with a mean gradient of 7 mm of mercury. There was mild biatrial enlargement. I last saw him in March of 2010. Since then the patient has dyspnea with more extreme activities but not with routine activities. It is relieved with rest. It is not associated with chest pain. There is no orthopnea, PND or pedal edema. There is no syncope or palpitations. There is no exertional chest pain.   Preventive Screening-Counseling & Management  Alcohol-Tobacco     Smoking Status: current  Current Medications (verified): 1)  Coreg 12.5 Mg Tabs (Carvedilol) .... Take 1 Tablet By Mouth Two Times A Day 2)  Lisinopril 20 Mg  Tabs (Lisinopril) .Marland Kitchen.. 1 Tab By Mouth Two Times A Day 3)  Sm Aspirin Ec 325 Mg  Tbec (Aspirin) .... Take 1 Tablet By Mouth Once A Day 4)  Lipitor 40 Mg  Tabs (Atorvastatin Calcium) .... Take 1 Tablet By Mouth Once A Day 5)  Viagra 25 Mg Tabs (Sildenafil Citrate) .... Take One Pill 30 Min To 1 Hour Befor Sexual Intercourse. You Can Take A Maximum To Two Pill At A Time. 6)  Acetaminophen 500 Mg  Caps (Acetaminophen)  .... As Needed  Allergies (verified): 1)  ! Prednisone  Past History:  Past Medical History: Current Problems:  CARDIOMYOPATHY (ICD-425.4)Alcoholic Cardiomyopathy:Non ischemic,dilated                                           Ef 25-30%(10/07) per cath                                           Cardiac cath 10/07 showing LAD 60-70%,OM 40%,RCA 30-40%  ATRIAL FIBRILLATION (ICD-427.31)Atrial fibrillation:Dx 10/07                          Not a candidate for anticoagulation secondary to ETOH use and Gastric ulcer(ASA) AORTIC STENOSIS (ICD-424.1) CAD (ICD-414.00) HYPERLIPIDEMIA (ICD-272.4) GASTRIC ULCER (ICD-531.90)H/o Gastric ulcer:seen on EGD by Dr.Hung (10/07) ERECTILE DYSFUNCTION, SECONDARY TO MEDICATION (ICD-607.84) BELL'S PALSY, RIGHT (ICD-351.0)Right peripheral Bell's Palsy CIRRHOSIS (ICD-571.5)Cirrhosis of liver secondary to ETOH abuse ANEMIA-IRON DEFICIENCY (ICD-280.9) ALCOHOL ABUSE (ICD-305.00) COPD (ICD-496)  Social History: Prior ETOH abuse now resolved Tobacco Use - Yes.  Smoking Status:  current  Review of Systems       Some arthralgias but no fevers or chills, productive cough, hemoptysis, dysphasia, odynophagia, melena, hematochezia,  dysuria, hematuria, rash, seizure activity, orthopnea, PND, pedal edema, claudication. Remaining systems are negative.   Vital Signs:  Patient profile:   65 year old male Height:      65 inches Weight:      184 pounds BMI:     30.73 Pulse rate:   69 / minute BP sitting:   132 / 64  (right arm) Cuff size:   regular  Vitals Entered By: Hardin Negus, RMA (October 03, 2009 8:15 AM)  Physical Exam  General:  Well-developed well-nourished in no acute distress.  Skin is warm and dry.  HEENT is normal.  Neck is supple. No thyromegaly.  Chest is clear to auscultation with normal expansion.  Cardiovascular exam is regular rate and rhythm. 2/6 systolic murmur at left sternal border. S2 is not diminished. 2/6 systolic murmur at the  apex. Abdominal exam nontender or distended. No masses palpated. Extremities show no edema. neuro grossly intact    EKG  Procedure date:  10/03/2009  Findings:      Sinus rhythm at a rate of 69. Axis normal. No significant ST changes.  Impression & Recommendations:  Problem # 1:  ATRIAL FIBRILLATION (ICD-427.31) Patient remains in sinus rhythm. We felt that his previous atrial fibrillation was most likely in the setting of his cardiomyopathy which has now improved. Continue aspirin and beta blocker. His updated medication list for this problem includes:    Coreg 12.5 Mg Tabs (Carvedilol) .Marland Kitchen... Take 1 tablet by mouth two times a day    Sm Aspirin Ec 325 Mg Tbec (Aspirin) .Marland Kitchen... Take 1 tablet by mouth once a day  Problem # 2:  CARDIOMYOPATHY (ICD-425.4) Ejection fraction improved on most recent echocardiogram. Continue ACE inhibitor and beta blocker. Check bmet. His updated medication list for this problem includes:    Coreg 12.5 Mg Tabs (Carvedilol) .Marland Kitchen... Take 1 tablet by mouth two times a day    Lisinopril 20 Mg Tabs (Lisinopril) .Marland Kitchen... 1 tab by mouth two times a day    Sm Aspirin Ec 325 Mg Tbec (Aspirin) .Marland Kitchen... Take 1 tablet by mouth once a day  Problem # 3:  AORTIC STENOSIS (ICD-424.1) Mild on examination. Followup echocardiogram in one year. His updated medication list for this problem includes:    Coreg 12.5 Mg Tabs (Carvedilol) .Marland Kitchen... Take 1 tablet by mouth two times a day    Lisinopril 20 Mg Tabs (Lisinopril) .Marland Kitchen... 1 tab by mouth two times a day  Problem # 4:  CAD (ICD-414.00) Continue aspirin, ACE inhibitor, beta blocker and statin. No chest pain. His updated medication list for this problem includes:    Coreg 12.5 Mg Tabs (Carvedilol) .Marland Kitchen... Take 1 tablet by mouth two times a day    Lisinopril 20 Mg Tabs (Lisinopril) .Marland Kitchen... 1 tab by mouth two times a day    Sm Aspirin Ec 325 Mg Tbec (Aspirin) .Marland Kitchen... Take 1 tablet by mouth once a day  Orders: EKG w/ Interpretation  (93000)  Problem # 5:  HYPERLIPIDEMIA (ICD-272.4) Continue statin. Check lipids and liver. His updated medication list for this problem includes:    Lipitor 40 Mg Tabs (Atorvastatin calcium) .Marland Kitchen... Take 1 tablet by mouth once a day  Problem # 6:  TOBACCO ABUSE (ICD-305.1) Patient counseled on discontinuing for between 3-10 minutes.  Problem # 7:  COPD (ICD-496)  The following medications were removed from the medication list:    Albuterol 90 Mcg/act Aers (Albuterol) ..... Inhale 1-2 puffs every 4-6 hours when needed  Patient Instructions:  1)  Your physician recommends that you return for a FASTING lipid profile: LFT and BMET.272.0 2)  Your physician recommends that you schedule a follow-up appointment in: 12 months

## 2010-07-30 NOTE — Assessment & Plan Note (Signed)
Summary: coumadin  Anticoagulant Therapy Managed by: [name]. [name]  PharmD CACP Referring MD: Dr. [name] PCP: Dr. [name] Indication 1: Atrial fibrillation Indication 2: Aftercare long term use Anticoagulants V58.61,V58.83 Start date: 09/18/2006 Duration: Indefinite  Patient Assessment Reviewed by: [name] PharmD  December 23, 2006 Medication review: verified warfarin dosage & schedule,verified previous prescription medications, verified doses & any changes, verified new medications, reviewed OTC medications, reviewed OTC health products-vitamins supplements etc Complications: none Dietary changes: none   Health status changes: none   Lifestyle changes: none   Recent/future hospitalizations: none   Recent/future procedures: none   Recent/future dental: none Patient Assessment Part 2:  Have you MISSED ANY DOSES or CHANGED TABLETS?  No missed Warfarin doses or changed tablets.  Have you had any BRUISING or BLEEDING ( nose or gum bleeds,blood in urine or stool)?  No reported bruising or bleeding in nose, gums, urine, stool.  Have you STARTED or STOPPED any MEDICATIONS, including OTC meds,herbals or supplements?  No other medications or herbal supplements were started or stopped.  Have you CHANGED your DIET, especially green vegetables,or ALCOHOL intake?  No changes in diet or alcohol intake.  Have you had any ILLNESSES or HOSPITALIZATIONS?  No reported illnesses or hospitalizations  Have you had any signs of CLOTTING?(chest discomfort,dizziness,shortness of breath,arms tingling,slurred speech,swelling or redness in leg)    No chest discomfort, dizziness, shortness of breath, tingling in arm, slurred speech, swelling, or redness in leg.     Treatment  Target INR: 2.0-3.0 INR: 1.3  Date: 12/23/2006 Regimen In:  22.5mg /week INR reflects regimen in: 1.3  New  Tablet strength: : 5mg  Regimen Out:     Sunday: 1 Tablet     Monday: 1 Tablet     Tuesday: 1 Tablet     Wednesday: 1 Tablet     Thursday: 1 Tablet      Friday: 1 Tablet     Saturday: 1 Tablet Total Weekly: 35.0mg /week mg  Next INR Due: 01/04/2007 Adjusted by: [name]. [name] PharmD CACP   Return to anticoagulation clinic:  01/04/2007 Time of next visit: 0900   Comments: Patient denies missed doses--though states upon questioning---regarding if he still has warfarin/coumadin--"that I am about out"....I am not certain that he may have missed doses--though he denies this.  INR today reflects 22.5mg /wk.  Will increase to 35mg /wk and see in clinic on January 04, 2007 at 0900.

## 2010-07-30 NOTE — Letter (Signed)
Summary: Custom - Lipid  [facility], Main Office  [address]   [place], [place] [address]   Phone: [phone]  Fax: [phone]     October 09, 2009 MRN: [id number]   [facility] [address] [place], [place]  [address]   Dear [name],  We have reviewed your cholesterol results.  They are as follows:     Total Cholesterol:    111 (Desirable: less than 200)       HDL  Cholesterol:     32.90  (Desirable: greater than 40 for men and 50 for women)       LDL Cholesterol:       59  (Desirable: less than 100 for low risk and less than 70 for moderate to high risk)       Triglycerides:       98.0  (Desirable: less than 150)  Our recommendations include:These numbers look good. Continue on the same medicine. Sodium, potassium,kidney and  Liver function are normal. Take care, [name].    Call our office at the number listed above if you have any questions.  Lowering your LDL cholesterol is important, but it is only one of a large number of "risk factors" that may indicate that you are at risk for heart disease, stroke or other complications of hardening of the arteries.  Other risk factors include:   A.  Cigarette Smoking* B.  High Blood Pressure* C.  Obesity* D.   Low HDL Cholesterol (see yours above)* E.   Diabetes Mellitus (higher risk if your is uncontrolled) F.  Family history of premature heart disease G.  Previous history of stroke or cardiovascular disease    *These are risk factors YOU HAVE CONTROL OVER.  For more information, visit .  There is now evidence that lowering the TOTAL CHOLESTEROL AND LDL CHOLESTEROL can reduce the risk of heart disease.  The American Heart Association recommends the following guidelines for the treatment of elevated cholesterol:  1.  If there is now current heart disease and less than two risk factors, TOTAL CHOLESTEROL should be less than 200 and LDL CHOLESTEROL should be less than 100. 2.  If  there is current heart disease or two or more risk factors, TOTAL CHOLESTEROL should be less than 200 and LDL CHOLESTEROL should be less than 70.  A diet low in cholesterol, saturated fat, and calories is the cornerstone of treatment for elevated cholesterol.  Cessation of smoking and exercise are also important in the management of elevated cholesterol and preventing vascular disease.  Studies have shown that 30 to 60 minutes of physical activity most days can help lower blood pressure, lower cholesterol, and keep your weight at a healthy level.  Drug therapy is used when cholesterol levels do not respond to therapeutic lifestyle changes (smoking cessation, diet, and exercise) and remains unacceptably high.  If medication is started, it is important to have you levels checked periodically to evaluate the need for further treatment options.  Thank you,    [employer] Team

## 2010-07-30 NOTE — Assessment & Plan Note (Signed)
Summary: COU/VS  Anticoagulant Therapy Managed by: [name]. [name]  PharmD CACP Referring MD: Dr. [name] PCP: Dr. [name] [facility] Attending: Margarito Liner MD Indication 1: Atrial fibrillation Indication 2: Aftercare long term use Anticoagulants V58.61,V58.83 Start date: 09/18/2006 Duration: Indefinite  Patient Assessment Reviewed by: [name] PharmD  June 28, 2007 Medication review: verified warfarin dosage & schedule,verified previous prescription medications, verified doses & any changes, verified new medications, reviewed OTC medications, reviewed OTC health products-vitamins supplements etc Complications: none Dietary changes: none   Health status changes: none   Lifestyle changes: none   Recent/future hospitalizations: none   Recent/future procedures: none   Recent/future dental: none Patient Assessment Part 2:  Have you MISSED ANY DOSES or CHANGED TABLETS?  No missed Warfarin doses or changed tablets.  Have you had any BRUISING or BLEEDING ( nose or gum bleeds,blood in urine or stool)?  No reported bruising or bleeding in nose, gums, urine, stool.  Have you STARTED or STOPPED any MEDICATIONS, including OTC meds,herbals or supplements?  No other medications or herbal supplements were started or stopped.  Have you CHANGED your DIET, especially green vegetables,or ALCOHOL intake?  No changes in diet or alcohol intake.  Have you had any ILLNESSES or HOSPITALIZATIONS?  No reported illnesses or hospitalizations  Have you had any signs of CLOTTING?(chest discomfort,dizziness,shortness of breath,arms tingling,slurred speech,swelling or redness in leg)    No chest discomfort, dizziness, shortness of breath, tingling in arm, slurred speech, swelling, or redness in leg.     Treatment  Target INR: 2.0-3.0 INR: 3.3  Date: 06/28/2007 Regimen In:  27.5mg /week INR reflects regimen in: 3.3  New  Tablet strength: : 5mg  Regimen Out:     Sunday: 1/2 Tablet  Monday: 1 Tablet     Tuesday: 1/2 Tablet     Wednesday: 1 Tablet     Thursday: 1/2 Tablet      Friday: 1 Tablet     Saturday: 1/2 Tablet Total Weekly: 25.0mg /week mg  Next INR Due: 08/02/2007 Adjusted by: [name]. Alexandria Lodge III PharmD CACP   Return to anticoagulation clinic:  08/02/2007 Time of next visit: 0845

## 2010-07-30 NOTE — Progress Notes (Signed)
Summary: INR Results reacted to by phone/remotely  Phone Note Other Incoming   Call placed by: Betha Loa  in the [facility] Lab Call placed to: Specialist Reason for Call: Discuss lab or test results Request: Send information Action Taken: Phone Call Completed, Appt scheduled, Information Sent Details for Reason: Patient came without scheduled appointment during my excused absence  (PAL) for his INR determination which was supposed to have been done this past Monday. Details of Request: Requested INR be performed. Details of Action Taken: INR was performed. Summary of Call: As above. Patient recently had interruption (planned) of warfarin for an elective surgical procedure. He was recommenced upon his previous dosing regimen which is reflected in his INR today, which was 2.6  Patient was advised by me--over the phone, to continue the same regimen he has documented written instructions for and to RTC on Monday 27-Apr-09 for repeat INR. Initial call taken by: [name] PharmD,  October 07, 2007 10:01 AM

## 2010-07-30 NOTE — Progress Notes (Signed)
Summary: Refill/gh  Phone Note Other Incoming Message from:  Patient on May 08, 2010 12:28 PM  Refills Requested: Medication #1:  CHANTIX STARTING MONTH PAK 0.5 MG X 11 & 1 MG X 42 TABS use as directed. Labs done 05/06/2010.  Last office visit was 04/17/2010   Method Requested: Electronic Initial call taken by: Angelina Ok RN,  May 08, 2010 12:28 PM    Prescriptions: CHANTIX STARTING MONTH PAK 0.5 MG X 11 & 1 MG X 42 TABS (VARENICLINE TARTRATE) use as directed  #1 x 0   Entered and Authorized by:   Julaine Fusi  DO   Signed by:   Julaine Fusi  DO on 05/08/2010   Method used:   Electronically to        [facility]* (retail)       [address]       [address] Shipping/mailing       [place], [place]  [address]       Ph: [phone]       Fax: [phone]   RxID:   [id number]

## 2010-07-30 NOTE — Assessment & Plan Note (Signed)
Summary: Coumadin Clinic  Anticoagulant Therapy Managed by: [name]. Janie Morning  PharmD CACP Referring MD: Dr. [name] PCP: Dr. [name] [facility] Attending: Margarito Liner MD Indication 1: Atrial fibrillation Indication 2: Aftercare long term use Anticoagulants V58.61,V58.83 Start date: 09/18/2006 Duration: Indefinite  Patient Assessment Reviewed by: [name] PharmD  January 28, 2007 Medication review: verified warfarin dosage & schedule,verified previous prescription medications, verified doses & any changes, verified new medications, reviewed OTC medications, reviewed OTC health products-vitamins supplements etc Complications: none Dietary changes: none   Health status changes: none   Lifestyle changes: none   Recent/future hospitalizations: none   Recent/future procedures: none   Recent/future dental: none Patient Assessment Part 2:  Have you MISSED ANY DOSES or CHANGED TABLETS?  No missed Warfarin doses or changed tablets.  Have you had any BRUISING or BLEEDING ( nose or gum bleeds,blood in urine or stool)?  No reported bruising or bleeding in nose, gums, urine, stool.  Have you STARTED or STOPPED any MEDICATIONS, including OTC meds,herbals or supplements?  No other medications or herbal supplements were started or stopped.  Have you CHANGED your DIET, especially green vegetables,or ALCOHOL intake?  No changes in diet or alcohol intake.  Have you had any ILLNESSES or HOSPITALIZATIONS?  No reported illnesses or hospitalizations  Have you had any signs of CLOTTING?(chest discomfort,dizziness,shortness of breath,arms tingling,slurred speech,swelling or redness in leg)    No chest discomfort, dizziness, shortness of breath, tingling in arm, slurred speech, swelling, or redness in leg.     Treatment  Target INR: 2.0-3.0 INR: 3.2  Date: 01/28/2007 Regimen In:  27.5mg /week INR reflects regimen in: 3.2  New  Tablet strength: : 2.5mg  Regimen Out:     Sunday: 1 & 1/2  Tablet     Monday: 1 & 1/2 Tablet     Tuesday: 1 & 1/2 Tablet     Wednesday: 1 Tablet     Thursday: 1 & 1/2 Tablet      Friday: 1 & 1/2 Tablet     Saturday: 1 & 1/2 Tablet Total Weekly: 25.0mg /week mg  Next INR Due: 02/15/2007 Adjusted by: [name]. Alexandria Lodge III PharmD CACP   Return to anticoagulation clinic:  02/15/2007 Time of next visit: 0845   Comments: Patient requested samples from [facility].  Did not have the 5mg  strength warfarin tablets.  Dispensed #30 of the 2.5mg  strength warfarin/Coumadin sample tablets to this patient. Amended the printed dosresponse dosing sheets to reflect that the "multiplier" for his visual/written instructions now reflect a 2.5mg  strength warfarin tablet--and adjusted his total weekly dose from 27.5mg /wk to 25mg /wk.  RTC in 3 weeks.

## 2010-08-01 NOTE — Progress Notes (Signed)
Summary: pt wants to be seen for asthma-wants a nurse call   Phone Note Call from Patient   Caller: Patient [phone] Reason for Call: Talk to Nurse Summary of Call: pt calling to make an appt for asthma, told him we don't treat pt's for asthma, and he wants a nurse call Initial call taken by: Glynda Jaeger,  July 04, 2010 10:07 AM  Follow-up for Phone Call        left message for pt to follow up with his primary care regarding his asthma or if he would like a referral to pulmonary to call me back Deliah Goody, RN  July 04, 2010 10:17 AM

## 2010-08-01 NOTE — Progress Notes (Signed)
Summary: refills/gg  Phone Note Refill Request  on June 10, 2010 4:58 PM  Refills Requested: Medication #1:  CHANTIX STARTING MONTH PAK 0.5 MG X 11 & 1 MG X 42 TABS use as directed.  Method Requested: Electronic Initial call taken by: Merrie Roof RN,  June 10, 2010 4:58 PM  Follow-up for Phone Call        reviewed records...Marland KitchenMarland Kitchen pt started "starter" pak in 03/2010 and was given a continuing pak on 04/2010. Have changed med list to the continuing pak this refill request and refilled (this is the 2nd continuing pak).  Will need to be reassessed if more is needed after this month. Follow-up by: Mariea Stable MD,  June 10, 2010 5:14 PM    New/Updated Medications: CHANTIX CONTINUING MONTH PAK 1 MG TABS (VARENICLINE TARTRATE) Take 1 tablet by mouth once a day Prescriptions: CHANTIX CONTINUING MONTH PAK 1 MG TABS (VARENICLINE TARTRATE) Take 1 tablet by mouth once a day  #1 pak x 0   Entered and Authorized by:   Mariea Stable MD   Signed by:   Mariea Stable MD on 06/10/2010   Method used:   Electronically to        [facility]* (retail)       [address]       [address] Shipping/mailing       [place], [place]  [address]       Ph: [phone]       Fax: [phone]   RxID:   [id number]

## 2010-08-08 ENCOUNTER — Ambulatory Visit (INDEPENDENT_AMBULATORY_CARE_PROVIDER_SITE_OTHER): Payer: 59 | Admitting: Cardiology

## 2010-08-08 ENCOUNTER — Encounter: Payer: Self-pay | Admitting: Cardiology

## 2010-08-08 DIAGNOSIS — E7800 Pure hypercholesterolemia, unspecified: Secondary | ICD-10-CM

## 2010-08-08 DIAGNOSIS — I359 Nonrheumatic aortic valve disorder, unspecified: Secondary | ICD-10-CM

## 2010-08-08 DIAGNOSIS — I10 Essential (primary) hypertension: Secondary | ICD-10-CM

## 2010-08-08 DIAGNOSIS — I2510 Atherosclerotic heart disease of native coronary artery without angina pectoris: Secondary | ICD-10-CM

## 2010-08-15 NOTE — Assessment & Plan Note (Signed)
Summary: ROV/SOB/DM    Primary Provider:  Almyra Deforest MD  CC:  sob pt states it maybe due to weight gain.  History of Present Illness: [name] is a very pleasant gentleman with a history of nonischemic cardiomyopathy improved by most recent echocardiogram.  He also has a history of coronary artery disease with cardiomyopathy out of proportion; alcohol use, now resolved; and paroxysmal atrial fibrillation holding sinus rhythm.  Cardiac catheterization in October of 2007 showed a 20% left main, a 60% mid LAD and a 60-70% distal LAD. There was a 30-40% first diagonal. There was a 30% circumflex and a 40% OM. There was a 30-40% right coronary artery. Ejection fraction was 25-30%.  Last echocardiogram in March of 2010 showed an ejection fraction of 55-65%. There was very mild aortic stenosis with a mean gradient of 7 mm of mercury. There was mild biatrial enlargement. I last saw him in April of 2011. Since then the patient has dyspnea with more extreme activities but not with routine activities. It is relieved with rest. It is not associated with chest pain. There is no orthopnea, PND or pedal edema. There is no syncope or palpitations. There is no exertional chest pain.   Current Medications (verified): 1)  Coreg 12.5 Mg Tabs (Carvedilol) .... Take 1 Tablet By Mouth Two Times A Day 2)  Lisinopril 20 Mg  Tabs (Lisinopril) .Marland Kitchen.. 1 Tab By Mouth Two Times A Day 3)  Sm Aspirin Ec 325 Mg  Tbec (Aspirin) .... Take 1 Tablet By Mouth Once A Day 4)  Lipitor 40 Mg  Tabs (Atorvastatin Calcium) .... Take 1 Tablet By Mouth Once A Day  Allergies: 1)  ! Prednisone  Past History:  Past Medical History: CARDIOMYOPATHY (ICD-425.4)Alcoholic Cardiomyopathy:Non ischemic,dilated - improved                                           Ef 25-30%(10/07) per cath                                           Cardiac cath 10/07 showing LAD 60-70%,OM 40%,RCA 30-40% ATRIAL FIBRILLATION (ICD-427.31)Atrial fibrillation:Dx  10/07 AORTIC STENOSIS (ICD-424.1) CAD (ICD-414.00) HYPERLIPIDEMIA (ICD-272.4) GASTRIC ULCER (ICD-531.90)H/o Gastric ulcer:seen on EGD by Dr.Hung (10/07) ERECTILE DYSFUNCTION, SECONDARY TO MEDICATION (ICD-607.84) BELL'S PALSY, RIGHT (ICD-351.0)Right peripheral Bell's Palsy CIRRHOSIS (ICD-571.5)Cirrhosis of liver secondary to ETOH abuse ANEMIA-IRON DEFICIENCY (ICD-280.9) ALCOHOL ABUSE (ICD-305.00) COPD (ICD-496)  Social History: Reviewed history from 10/03/2009 and no changes required. Prior ETOH abuse now resolved Tobacco Use - Patient states he has discontinued since October 2011  Review of Systems       no fevers or chills, productive cough, hemoptysis, dysphasia, odynophagia, melena, hematochezia, dysuria, hematuria, rash, seizure activity, orthopnea, PND, pedal edema, claudication. Remaining systems are negative.   Vital Signs:  Patient profile:   65 year old male Height:      65 inches Weight:      190 pounds BMI:     31.73 Pulse rate:   75 / minute Resp:     16 per minute BP sitting:   123 / 74  (left arm)  Vitals Entered By: Kem Parkinson (August 08, 2010 11:37 AM)  Physical Exam  General:  Well-developed well-nourished in no acute distress.  Skin is warm  and dry.  HEENT is normal.  Neck is supple. No thyromegaly.  Chest is clear to auscultation with normal expansion.  Cardiovascular exam is regular rate and rhythm. 2/6 stolic murmur left sternal border. No diastolic murmur. Abdominal exam nontender or distended. No masses palpated. positive bruit Extremities show no edema. neuro grossly intact    EKG  Procedure date:  08/08/2010  Findings:      Sinus with first degree AV block.  Impression & Recommendations:  Problem # 1:  CARDIOMYOPATHY (ICD-425.4) Improved on most recent echocardiogram. Continue beta blocker and ACE inhibitor. Plan repeat echocardiogram. His updated medication list for this problem includes:    Coreg 12.5 Mg Tabs (Carvedilol)  .Marland Kitchen... Take 1 tablet by mouth two times a day    Lisinopril 20 Mg Tabs (Lisinopril) .Marland Kitchen... 1 tab by mouth two times a day    Sm Aspirin Ec 325 Mg Tbec (Aspirin) .Marland Kitchen... Take 1 tablet by mouth once a day  Problem # 2:  ATRIAL FIBRILLATION (ICD-427.31) History of atrial fibrillation in the setting of cardiomyopathy but no recurrences since his LV function has improved. Continue aspirin and beta blocker. His updated medication list for this problem includes:    Coreg 12.5 Mg Tabs (Carvedilol) .Marland Kitchen... Take 1 tablet by mouth two times a day    Sm Aspirin Ec 325 Mg Tbec (Aspirin) .Marland Kitchen... Take 1 tablet by mouth once a day  Problem # 3:  AORTIC STENOSIS (ICD-424.1) Repeat echocardiogram. His updated medication list for this problem includes:    Coreg 12.5 Mg Tabs (Carvedilol) .Marland Kitchen... Take 1 tablet by mouth two times a day    Lisinopril 20 Mg Tabs (Lisinopril) .Marland Kitchen... 1 tab by mouth two times a day  Orders: Echocardiogram (Echo)  Problem # 4:  CAD (ICD-414.00)  Continue aspirin, beta blocker, ACE inhibitor and statin. His updated medication list for this problem includes:    Coreg 12.5 Mg Tabs (Carvedilol) .Marland Kitchen... Take 1 tablet by mouth two times a day    Lisinopril 20 Mg Tabs (Lisinopril) .Marland Kitchen... 1 tab by mouth two times a day    Sm Aspirin Ec 325 Mg Tbec (Aspirin) .Marland Kitchen... Take 1 tablet by mouth once a day  His updated medication list for this problem includes:    Coreg 12.5 Mg Tabs (Carvedilol) .Marland Kitchen... Take 1 tablet by mouth two times a day    Lisinopril 20 Mg Tabs (Lisinopril) .Marland Kitchen... 1 tab by mouth two times a day    Sm Aspirin Ec 325 Mg Tbec (Aspirin) .Marland Kitchen... Take 1 tablet by mouth once a day  Problem # 5:  HYPERLIPIDEMIA (ICD-272.4)  Continue statin. Lipids and liver monitored by primary care. His updated medication list for this problem includes:    Lipitor 40 Mg Tabs (Atorvastatin calcium) .Marland Kitchen... Take 1 tablet by mouth once a day  His updated medication list for this problem includes:    Lipitor 40 Mg  Tabs (Atorvastatin calcium) .Marland Kitchen... Take 1 tablet by mouth once a day  Problem # 6:  TOBACCO ABUSE (ICD-305.1) Now resolved.  Problem # 7:  COPD (ICD-496)  Other Orders: Abdominal Aorta Duplex (Abd Aorta Duplex)  Patient Instructions: 1)  Your physician wants you to follow-up in:ONE YEAR   You will receive a reminder letter in the mail two months in advance. If you don't receive a letter, please call our office to schedule the follow-up appointment. 2)  Your physician has requested that you have an abdominal aorta duplex. During this test, an ultrasound is used to  evaluate the aorta. Allow 30 minutes for this exam. Do not eat after midnight the day before and avoid carbonated beverages. There are no restrictions or special instructions. 3)  Your physician has requested that you have an echocardiogram.  Echocardiography is a painless test that uses sound waves to create images of your heart. It provides your doctor with information about the size and shape of your heart and how well your heart's chambers and valves are working.  This procedure takes approximately one hour. There are no restrictions for this procedure.

## 2010-08-26 ENCOUNTER — Encounter: Payer: Self-pay | Admitting: Cardiology

## 2010-08-26 ENCOUNTER — Ambulatory Visit (HOSPITAL_COMMUNITY): Payer: 59 | Attending: Internal Medicine

## 2010-08-26 ENCOUNTER — Encounter (INDEPENDENT_AMBULATORY_CARE_PROVIDER_SITE_OTHER): Payer: 59

## 2010-08-26 DIAGNOSIS — I428 Other cardiomyopathies: Secondary | ICD-10-CM | POA: Insufficient documentation

## 2010-08-26 DIAGNOSIS — I059 Rheumatic mitral valve disease, unspecified: Secondary | ICD-10-CM | POA: Insufficient documentation

## 2010-08-26 DIAGNOSIS — I079 Rheumatic tricuspid valve disease, unspecified: Secondary | ICD-10-CM | POA: Insufficient documentation

## 2010-08-26 DIAGNOSIS — I359 Nonrheumatic aortic valve disorder, unspecified: Secondary | ICD-10-CM

## 2010-08-26 DIAGNOSIS — R0989 Other specified symptoms and signs involving the circulatory and respiratory systems: Secondary | ICD-10-CM

## 2010-08-26 DIAGNOSIS — E785 Hyperlipidemia, unspecified: Secondary | ICD-10-CM | POA: Insufficient documentation

## 2010-08-30 ENCOUNTER — Telehealth: Payer: Self-pay | Admitting: Cardiology

## 2010-09-05 NOTE — Progress Notes (Signed)
Summary: pt rtn call   Phone Note Call from Patient Call back at [phone]   Caller: Patient Reason for Call: Talk to Nurse, Talk to Doctor Summary of Call: rtn call to Debra from yesterday Initial call taken by: Omer Jack,  August 30, 2010 8:15 AM  Follow-up for Phone Call        Pt. aware of aorta duplex results. Follow-up by: Ollen Gross, RN, BSN,  August 30, 2010 8:29 AM

## 2010-10-11 ENCOUNTER — Other Ambulatory Visit: Payer: Self-pay | Admitting: *Deleted

## 2010-10-11 MED ORDER — ASPIRIN 325 MG PO TBEC: 325.0000 mg | DELAYED_RELEASE_TABLET | Freq: Every day | ORAL | Status: DC

## 2010-11-12 NOTE — Assessment & Plan Note (Signed)
[facility]                            CARDIOLOGY OFFICE NOTE   NAME:Fuerst, PHIL                         MRN:          [id number]  DATE:09/03/2007                            DOB:          [birth year]    Mr. Rochford rate is a very pleasant, 65 year old gentleman who has a  history of nonischemic cardiomyopathy - (improved by most recent  echocardiogram), coronary artery disease with cardiomyopathy out of  proportion, alcohol abuse now resolved and paroxysmal atrial  fibrillation.  Since I last saw him, he does have some dyspnea on  exertion but he attributes this to his weight.  There is no orthopnea,  PND, pedal edema, palpitations, presyncope, syncope or chest pain.  He  has seen an orthopedist concerning a right shoulder problem.  Apparently  he has a suspicious area in his lung and needs a chest CT based on a  note that I received from the orthopedist.   CURRENT MEDICATIONS:  1. Pravachol 40 mg daily.  2. Carvedilol 12.5 mg p.o. b.i.d.  3. Amiodarone 200 mg p.o. daily.  4. Lasix 4 mg p.o. daily.  5. Coumadin.  6. Lisinopril 20 mg p.o. b.i.d.  7. Chantix.  8. Tylenol.   PHYSICAL EXAM:  Blood pressure of 128/70.  His pulse is 72.  He weighs  188 pounds.  HEENT:  Normal.  NECK:  Supple.  CHEST:  Clear.  CARDIOVASCULAR:  Regular rate and rhythm.  ABDOMEN:  No tenderness.  EXTREMITIES:  No edema.   His electrocardiogram shows a sinus rhythm with a first-degree AV block.  The axis is normal.  There are no ST changes noted.   DIAGNOSES:  1. Preoperative evaluation prior to shoulder surgery - the patient has      had no chest pain and his catheterization in October 2007 showed      moderate disease.  I think he is safe to proceed with surgery.  2. History of paroxysmal atrial fibrillation - the patient remains in      sinus rhythm today.  We will continue his amiodarone and Coumadin      for now.  He will discontinue his Coumadin 5 days  prior to his      procedure and then resume afterwards. Note his atrial fibrillation      occurred when his LV function was reduced and his most recent      echocardiogram in June 2008 showed normal LV function.  He is also      not drinking at all any more.  I therefore think we could      potentially discontinue his amiodarone after his surgery to see if      he will maintain sinus.  If he does then we could consider      discontinuing his Coumadin down the road as well.  I think most of      his previous problems related to his alcohol.  3. History of nonischemic cardiomyopathy - he will continue on his ACE      inhibitor and his Coreg. His most recent echocardiogram showed  a      normal LV function.  4. History of mild aortic stenosis - he will need follow-up      echocardiograms in the future.  5. Coronary artery disease - he will continue on his statin, beta-      blocker, and ACE inhibitor.  6. Alcohol abuse now resolved.  7. Tobacco abuse - he has decreased this to one cigarette several      times per week.  8. Amiodarone - we will check liver functions, TSH.  I will also check      a chest CT both for the question of an abnormality on his chest x-      ray and also for his amiodarone use.  9. Coumadin therapy - this is being monitored by his primary care      physician.  10.History of hyperlipidemia - we will check lipids and liver today.      We will continue his statin.  11.History of gastric ulcer.   We will see him back in 6 months.     Madolyn Frieze [name], MD, [facility]  Electronically Signed    BSC/MedQ  DD: 09/03/2007  DT: 09/04/2007  Job #: [id number]

## 2010-11-12 NOTE — Assessment & Plan Note (Signed)
[facility]                            CARDIOLOGY OFFICE NOTE   NAME:Battaglia, [name]                         MRN:          [id number]  DATE:12/23/2006                            DOB:          [birth year]    [name] is a gentleman, who has a history of non-ischemic  cardiomyopathy, cardiomyopathy with coronary disease out of proportion,  alcohol abuse, now resolved, and atrial fibrillation.  When I last saw  him, he had developed recurrent atrial fibrillation and we placed him on  amiodarone.  We also repeated his echocardiogram on December 02, 2006.  His  LV function had improved to normal.  There was mild LVH.  There was mild  aortic stenosis and there was mild to moderate mitral regurgitation.  The left atrium was moderately dilated.  There was mild tricuspid  regurgitation.  Since I last saw him, he is doing well symptomatically.  He denies any dyspnea, chest pain, palpitations or syncope.  There is no  pedal edema.   MEDICATIONS INCLUDE:  1. Coreg 12.5 mg p.o. b.i.d.  2. Lasix 40 mg p.o. daily.  3. Lisinopril 10 mg p.o. b.i.d.  4. Amiodarone 200 mg p.o. b.i.d.  5. Pravachol 20 mg p.o. daily.  6. He also takes Coumadin, which is being monitored at the hospital.   PHYSICAL EXAM:  His physical exam today shows a blood pressure of  126/90, his pulse is 66.  He weighs 177 pounds.  HEENT:  Normal.  NECK:  Supple.  CHEST:  Clear.  CARDIOVASCULAR EXAM:  Reveals a regular rate and rhythm.  There is a 2/6  systolic murmur at the left sternal border.  ABDOMINAL EXAM:  Benign.  EXTREMITIES:  Show no edema.   DIAGNOSES:  1. Atrial fibrillation:  By examination, the patient has converted      back to sinus rhythm.  We will verify that with an      electrocardiogram today.  I will decrease his amiodarone to 200 mg      p.o. daily.  He will continue on Coumadin with a goal INR of 2-3      and this will be followed at the hospital.  I will have him return      in three months and we will need to check a chest x-ray, TSH and      liver functions, given his amiodarone use.  Note, we did perform      pulmonary function tests recent.  2. Non-ischemic cardiomyopathy:  His echocardiogram showed improvement      in his LV function.  He will continue off his alcohol.  We will      also continue with his lisinopril and Coreg.  3. Coronary artery disease:  He will continue on his beta blocker, as      well as ACE inhibitor and statin.  He has had no chest pain or      shortness of breath.  4. Alcohol abuse:  Now resolved.  5. Tobacco abuse:  He understands that he should continue this.  6. Question history of cirrhosis:  We will follow his liver functions      closely.  7. History of gastritis/gastric ulcer.   When he returns, we will also check a BMET, as well as lipids and liver.  He will see me back in three months.     Madolyn Frieze [name], MD, [facility]  Electronically Signed    BSC/MedQ  DD: 12/23/2006  DT: 12/23/2006  Job #: [id number]

## 2010-11-12 NOTE — Assessment & Plan Note (Signed)
[facility]                            CARDIOLOGY OFFICE NOTE   NAME:[name], [name]                         MRN:          [id number]  DATE:11/18/2006                            DOB:          [birth year]    Mr. Malter is a pleasant gentleman who has a history of nonischemic  cardiomyopathy, coronary disease with cardiomyopathy, alcohol abuse,  atrial fibrillation, GI bleeding, question sclerosis.  However, note his  liver functions have been normal.  We did proceed with a cardioversion  on Nov 03, 2006.  He converted to sinus rhythm with no complications.  Since then he denies any dyspnea, chest pain, palpitations or syncope,  and there is no pedal edema.  He has discontinued his alcohol use  completely.   MEDICATIONS:  1. Coreg 12.5 mg p.o. b.i.d.  2. Lasix 40 mg p.o. daily.  3. Digoxin 0.125 mg p.o. daily.  4. Lisinopril 10 mg p.o. b.i.d.  5. Nexium 40 mg p.o. daily.  6. He is also taking Pravachol 20 mg p.o. daily.   PHYSICAL EXAMINATION:  Blood pressure 112/62 and his pulse was 56.  He  weighs 175 pounds.  HEENT:  Normal.  NECK:  Supple with no bruits.  CHEST:  Clear.  CARDIOVASCULAR:  Irregular rhythm.  ABDOMEN:  Benign.  EXTREMITIES:  Show no edema.   Electrocardiogram shows recurrent atrial fibrillation at a rate of 57.  The axis is normal.  A prior septal infarct cannot be excluded.  There  are non-specific ST changes.   DIAGNOSES:  1. Recurrent atrial fibrillation - the patient has converted back to      atrial fibrillation following his recent cardioversion.  He is      relatively asymptomatic.  However, given his age, we would like to      maintain sinus rhythm if possible.  We will, therefore, add      amiodarone 200 mg p.o. b.i.d.  He will need to have his Coumadin      checked in approximately 2 weeks to make sure that it is not      increasing due to the interaction with amiodarone.  Note, with the      addition of amiodarone  I will discontinue his digoxin as I am      concerned about him developing worsening bradycardia.  He will see      a PA in 2 weeks to make sure that his heart rate is stable, and I      will see him back in 4 weeks.  If he has not converted his sinus      rhythm then we will plan to repeat his cardioversion to see if the      amiodarone helps him to maintain sinus rhythm.  Note, his liver      functions and TSH were normal recently.  I will check pulmonary      function tests with DLCO.  He will need followup of all of these      things in the future given the addition of his amiodarone (TSH,  liver functions and chest x-ray).  2. Nonischemic cardiomyopathy - he will continue on his Coreg and      Lisinopril and we will plan to repeat his echocardiogram in June.      Now that he has discontinued his alcohol abuse and that his heart      rate is controlled hopefully his left ventricular function will      have improved.  3. Coronary artery disease with cardiomyopathy out of proportion - he      will continue on his statin, beta blocker and ACE inhibitor.  4. History of alcohol abuse, now resolved.  5. History of gastritis/gastric ulcer - most recent hemoccult is      stable.  6. Tobacco abuse - we discussed the importance of discontinuing this.  7. Question history of cirrhosis - his liver functions have been      normal.   I will see him back in 4 weeks.     Madolyn Frieze [name], MD, [facility]  Electronically Signed    BSC/MedQ  DD: 11/18/2006  DT: 11/18/2006  Job #: [id number]

## 2010-11-12 NOTE — Op Note (Signed)
NAMEZACHARIAH, [name]                ACCOUNT NO.:  [id number]   MEDICAL RECORD NO.:  [id number]          PATIENT TYPE:  AMB   LOCATION:  DAY                          FACILITY:  [facility]   PHYSICIAN:  Georges Lynch. Gioffre, M.D.DATE OF BIRTH:  12-05-1945   DATE OF PROCEDURE:  09/21/2007  DATE OF DISCHARGE:                               OPERATIVE REPORT   SURGEON:  Georges Lynch. Darrelyn Hillock, M.D.   ASSISTANT:  Jamelle Rushing, P.A.   PREOPERATIVE DIAGNOSES:  1. Severe impingement syndrome, right shoulder.  2. Frozen shoulder, right shoulder.  3. Partial tear of the rotator cuff tendon, right shoulder.   POSTOPERATIVE DIAGNOSES:  1. Severe impingement syndrome, right shoulder.  2. Frozen shoulder, right shoulder.  3. Partial tear of the rotator cuff tendon, right shoulder.   OPERATION:  1. Closed manipulation, right shoulder, for frozen shoulder.  2. Open acromionectomy, right shoulder.  3. Repair of a torn rotator cuff tendon, right shoulder, utilizing a      Restore tendon graft.   PROCEDURE:  Under general anesthesia, routine orthopedic prep and drape  of the right shoulder was carried out.  The patient had 1 g of IV Ancef.  Incision was made over the anterior aspect of the right shoulder,  bleeders were identified and cauterized.  Following that, the deltoid  tendon was identified, stripped from the acromion by sharp dissection.  I split the proximal part of the deltoid muscle.  Following that the  subdeltoid bursa was removed.  It was quite inflamed.  I then inserted  the Bennett retractor and protected the rotator cuff and did a partial  acromionectomy utilizing the oscillating saw.  Following that I utilized  a bur to even out the undersurface of the acromion.  I thoroughly  irrigated out the area.  I bone-waxed the undersurface of the acromion.  I then inspected the rotator cuff.  It was severely thinned out in its  insertion mainly from the severe impingement from the downsloping of  the  acromion.  What I did at this point, I elected to go ahead and repair  that utilizing a 3 x 4 Restore tendon graft.  That was sutured in place  with #1 Ethibond suture.  I then reapproximated the deltoid tendon and  muscle in the usual  fashion.  The subcu was closed with 0 Vicryl, skin was closed with metal  staples.  A sterile Neosporin dressing was applied and he was placed in  a shoulder immobilizer.  Note, prior to taking him back to surgery he  had an interscalene nerve block on the right by the anesthesiologist.           ______________________________  Georges Lynch. Darrelyn Hillock, M.D.     RAG/MEDQ  D:  09/21/2007  T:  09/21/2007  Job:  [id number]   cc:   Madolyn Frieze. [name], MD, [facility]  [address]  [address]  [place]  [place] [address]

## 2010-11-12 NOTE — Assessment & Plan Note (Signed)
[facility]                            CARDIOLOGY OFFICE NOTE   NAME:[name], [name]                         MRN:          [id number]  DATE:03/03/2007                            DOB:          Oct 08, 1945    [name] is a very pleasant 65 year old gentleman with a history of  nonischemic cardiomyopathy (improved by most recent echocardiogram),  coronary artery disease, alcohol abuse, now resolved, and paroxysmal  atrial fibrillation.  Since I last saw him, he has mild dyspnea on  exertion but attributes this to weight gain.  There is no orthopnea,  PND, pedal edema, palpitations, presyncope, syncope, or chest pain.  There is no bleeding.   MEDICATIONS:  1. Coreg 12.5 mg p.o. b.i.d.  2. Lasix 40 mg p.o. daily.  3. Lisinopril 10 mg p.o. b.i.d.  4. Nexium 40 mg p.o. daily.  5. Amiodarone 200 mg p.o. daily.  6. Pravachol 40 mg p.o. daily.  7. Coumadin.   PHYSICAL EXAMINATION:  Shows a blood pressure of 141/83 and his pulse is  68.  He weighs 180 pounds.  His HEENT is normal.  His neck is supple with no bruits.  His chest is clear.  His cardiovascular exam reveals a regular rate and rhythm.  His abdominal exam is benign.  His extremities show no edema.   DIAGNOSES:  1. History of paroxysmal atrial fibrillation:  The patient remains in      sinus rhythm on exam today.  We will continue with his amiodarone.      I will also continue him on Coumadin with a goal INR of 2-3 as his      left ventricular function has been reduced in the past.  His      Coumadin is being monitored at the hospital.  Our goal INR will be      2-3.  2. History of nonischemic cardiomyopathy:  His most recent      echocardiogram performed on December 02, 2006 revealed that his left      ventricular function had returned to normal.  There was mild aortic      stenosis with a mean gradient of 11 mmHg and mild-to-moderate      mitral regurgitation.  The left atrium is mildly  dilated.  He will      continue on his beta blocker, angiotensin-converting enzyme      inhibitor and present dose of diuretic.  3. Coronary artery disease:  He has had no chest pain and we will      continue with his statin, beta blocker, and angiotensin-converting      enzyme inhibitor.  4. Alcohol abuse:  Now resolved.  5. Tobacco abuse:  We discussed the importance of discontinuing this.  6. Amiodarone:  We will check liver functions, TSH, and chest x-ray      today.  7. Coumadin therapy.  8. History of hyperlipidemia:  We will check lipids today and adjust      with a goal LDL of less than 70.  I will also check a CBC and a  BMET today given his Coumadin use and angiotensin-converting enzyme      inhibitor/diuretic use.  9. History of gastric ulcer.   We will see him back in 6 months.     Madolyn Frieze [name], MD, [facility]  Electronically Signed    BSC/MedQ  DD: 03/03/2007  DT: 03/03/2007  Job #: [id number]

## 2010-11-12 NOTE — Op Note (Signed)
[name], [name]                ACCOUNT NO.:  [id number]   MEDICAL RECORD NO.:  [id number]          PATIENT TYPE:  OIB   LOCATION:  2859                         FACILITY:  [facility]   PHYSICIAN:  Madolyn Frieze. [name], MD, FACCDATE OF BIRTH:  26-Aug-1945   DATE OF PROCEDURE:  11/03/2006  DATE OF DISCHARGE:                               OPERATIVE REPORT   This is a procedure note for cardioversion of atrial fibrillation.  The  patient was sedated with Pentothal 150 mg intravenously.  Synchronized  cardioversion with 120 jewels (biphasic) resulting in normal sinus  rhythm.  There were no immediate complications.  We would recommend  continuing Coumadin and followup as scheduled.      Madolyn Frieze [name], MD, [facility]  Electronically Signed     BSC/MEDQ  D:  11/03/2006  T:  11/03/2006  Job:  [id number]

## 2010-11-12 NOTE — Assessment & Plan Note (Signed)
[name]                            CARDIOLOGY OFFICE NOTE   NAME:[name]                         MRN:          [id number]  DATE:08/28/2008                            DOB:          Dec 18, 1945    [name] is a very pleasant gentleman with a history of nonischemic  cardiomyopathy improved by most recent echocardiogram.  He also has a  history of coronary artery disease with cardiomyopathy out of  proportion; alcohol use, now resolved; and paroxysmal atrial  fibrillation holding sinus rhythm.  Since I last saw him, he is doing  well from symptomatic standpoint.  He has dyspnea with more extreme  activities, but not with routine activities.  There is no orthopnea,  PND, or pedal edema.  There is no chest pain, palpitations, or syncope.   PRESENT MEDICATIONS:  1. Carvedilol 12.5 mg p.o. b.i.d.  2. Lisinopril 20 mg p.o. b.i.d.  3. Tylenol as needed.  4. Lipitor 40 mg p.o. daily.  5. Enteric-coated aspirin 325 mg p.o. daily.   PHYSICAL EXAMINATION:  VITAL SIGNS:  Blood pressure 126/80 and his pulse  is 72.  HEENT:  Normal.  NECK:  Supple.  No bruits.  CHEST:  Clear.  CARDIOVASCULAR:  Regular rate and rhythm.  ABDOMEN:  No tenderness.  EXTREMITIES:  No edema.   His electrocardiogram shows a sinus rhythm at a rate of 72.  There are  no ST changes noted.   DIAGNOSES:  1. History of paroxysmal atrial fibrillation - the patient remains in      sinus rhythm today and his amiodarone was discontinued previously.      I think his atrial fibrillation occurred in the setting of alcohol      use and reduced left ventricular function.  He has not discontinued      his alcohol use and remains in sinus rhythm.  We will plan to      repeat his echocardiogram to reassess his left ventricular      function.  Note, his CHADS2 score is 1 for hypertension.  We will      continue with his aspirin.  We will continue to follow him for any      recurrent  atrial fibrillation.  2. History of nonischemic cardiomyopathy - he will continue on his ACE      inhibitor and Coreg.  We will plan to repeat his echocardiogram.  3. History of mild aortic stenosis and moderate mitral regurgitation -      we are repeating his echo.  4. Coronary artery disease - he will continue on his aspirin, beta-      blocker, ACE inhibitor, and statin.  5. Alcohol abuse, now resolved.  6. Tobacco abuse - we discussed the importance of discontinuing this      for between 3 to 10 minutes.  7. History of hyperlipidemia - he will continue on statin.  We will      check lipids and liver and adjust as indicated.  We will also check      a BMET given his ACE inhibitor  use.  8. History of gastric ulcer.   We will see him back in 9 months.     [name] Richard Som, MD, [facility]  Electronically Signed    BSC/MedQ  DD: 08/28/2008  DT: 08/29/2008  Job #: [id number]

## 2010-11-12 NOTE — Assessment & Plan Note (Signed)
[facility]                            CARDIOLOGY OFFICE NOTE   NAME:Mennen, PHIL                         MRN:          [id number]  DATE:12/27/2007                            DOB:          July 02, 1945    Mr. Towry is a pleasant gentleman who has a history of nonischemic  cardiomyopathy, improved by most recent echocardiogram; coronary artery  disease with cardiomyopathy, out of portion; alcohol abuse, now  resolved, and paroxysmal atrial fibrillation.  Since I last saw him, he  is doing well with no dyspnea, chest pain, palpitations, or syncope.  There is no pedal edema.  He did undergo right shoulder surgery and  still having some pain from that.   MEDICATIONS:  At present include,  1. Carvedilol 12.5 mg p.o. b.i.d.  2. Coumadin as directed.  3. Lisinopril 20 mg p.o. b.i.d.  4. Tylenol and Lipitor 40 mg p.o. daily.   PHYSICAL EXAMINATION:  VITALS:  Blood pressure 128/80 and his pulse is  76.  He weighs 180 pounds.  HEENT:  Normal.  NECK:  Supple with no bruits.  CHEST:  Clear.  CARDIOVASCULAR:  Regular rhythm.  There is a 2/6 systolic murmur at the  left sternal border.  ABDOMEN:  No tenderness.  EXTREMITIES:  No edema.   DIAGNOSES:  1. History of paroxysmal atrial fibrillation - Mr. Leretha Dykes remains in      sinus rhythm today off of his amiodarone.  I feel that his atrial      fibrillation occurred when his left ventricular function was      reduced in the past and we felt this was most likely related to      alcohol use.  His left ventricular function has now returned to      normal and he has discontinued his alcohol use.  He continues to be      in sinus rhythm.  Also, his [place] score is 1 for hypertension.  I      therefore will discontinue his Coumadin, instead we will treat with      enteric-coated aspirin 325 mg p.o. daily.  We will follow him      closely for any recurrent atrial fibrillation.  2. History of nonischemic  cardiomyopathy - this is improved on most      recent echocardiogram.  We will continue with his ACE inhibitor and      his Coreg.  3. History of mild aortic stenosis.  4. Coronary artery disease - he will begin his aspirin and continue on      the beta-blocker, ACE inhibitor, and his Lipitor.  5. Alcohol abuse, now resolved.  6. Tobacco abuse - he continues to work on this.  7. Amiodarone - he is now off of this medication completely.  8. History of hyperlipidemia - we will check lipids and liver and      adjust his Lipitor with goal LDL of less than 70.  9. History of gastric ulcer.   I will see him back in 9 months.     Madolyn Frieze [name], MD, [facility]  Electronically Signed    BSC/MedQ  DD: 12/27/2007  DT: 12/27/2007  Job #: [id number]

## 2010-11-15 NOTE — Assessment & Plan Note (Signed)
[facility]                              CARDIOLOGY OFFICE NOTE   NAME:[name], [name]                         MRN:          [id number]  DATE:04/28/2006                            DOB:          [birth year]    The patient was seen in the [facility] clinic on April 28, 2006 for Dr.  Diona Browner.  This is a patient of Dr. Eden Emms.  This is a 65 year old white  male who works as a Facilities manager at [employer].  He was admitted to the  hospital and found to have a non-ischemic dilated cardiomyopathy secondary  to alcohol abuse, atrial fibrillation, iron deficiency anemia secondary to  gastric ulcer, and cirrhosis.  The patient underwent cardiac catheterization  at the hospital which revealed non-obstructive coronary artery disease with  60-70% distal LAD, 40% OM, 30-40%  RCA, global hypokinesis, EF 25-30%, 1-2+  MR.  2-D echo showed mild to moderate MR, ejection fraction 35-40% with  global hypokinesis marked in the anterior septal wall, mild aortic  sclerosis.  Left atrium was moderately to markedly dilated 45 mm/hg.   Since the patient has been home, he has quit drinking alcohol completely.  He does continue to smoke and uses salt when he forgets.  Overall he has  done well.  He denies any shortness of breath, chest pain, dizziness or pre-  syncope.   CURRENT MEDICATIONS:  1. Prilosec 40 mg b.i.d.  2. Coreg 12.5 mg b.i.d.  3. NicoDerm patch 21 mg.  4. Lasix 40 mg daily.  5. Digoxin 0.125 mg daily.  6. Omeprazole 20 mg.  7. Albuterol inhaler aerosol 17 grams.  8. Lisinopril 10 mg daily.   PHYSICAL EXAMINATION:  This is a pleasant 65 year old white male in no acute  distress.  Blood pressure 116/69, weight 159, pulse 78.  NECK:  Without JVD, _________.  He does have bilateral carotid bruits, right  greater than left.  LUNGS:  Decreased breath sounds but clear in posterolateral  HEART:  Irregular rate and rhythm at 78 BPM.  Normal S1 and S2, with a  1/6  systolic murmur at the apex.  ABDOMEN:  Soft without organomegaly, masses, lesions or abnormal tenderness.  EXTREMITIES:  Right groin without hematoma or hemorrhage.  Extremities  without cyanosis, clubbing or edema, good distal pulses.   DATA:  EKG:  Atrial fibrillation with nonspecific ST-T-wave changes.   IMPRESSION:  1. Non-ischemic cardiomyopathy secondary to alcohol abuse.  2. Atrial fibrillation with controlled ventricular rate not thought to be      a Coumadin candidate.  3. Iron deficiency anemia secondary to gastritis and gastric ulcer.  4. Alcohol abuse, quit.  5. Ongoing tobacco abuse.  6. Cirrhosis of the liver secondary to alcohol.   PLAN:  The patient is compensated from a heart failure standpoint.  His  atrial fibrillation is controlled.  I have advised him to not wear the  nicotine patch if he continues to smoke and have asked him to try to quit  completely and to avoid salt.  He will see Dr. Eden Emms back in 2 months.  ______________________________  Jacolyn Reedy, PA-C    ______________________________  Jonelle Sidle, MD   ML/MedQ  DD: 04/28/2006  DT: 04/28/2006  Job #: [id number]

## 2010-11-15 NOTE — Discharge Summary (Signed)
[name], [name]                ACCOUNT NO.:  [id number]   MEDICAL RECORD NO.:  [id number]          PATIENT TYPE:  INP   LOCATION:  2012                         FACILITY:  [facility]   PHYSICIAN:  Duncan Dull, M.D.     DATE OF BIRTH:  1946/05/01   DATE OF ADMISSION:  04/07/2006  DATE OF DISCHARGE:  04/14/2006                                 DISCHARGE SUMMARY   DISCHARGE DIAGNOSES:  1. Non ischemic dilated cardiomyopathy secondary to alcohol abuse.  2. Iron deficiency anemia secondary to gastritis and gastric ulcer.  3. Newly detected atrial fibrillation.  4. Hoarseness of voice most likely secondary to smoking, improved.  5. Alcohol abuse.  6. Tobacco abuse.  7. Cirrhosis of liver secondary to alcohol.   DISCHARGE MEDICATIONS:  1. Prilosec 40 mg p.o. b.i.d.  2. Coreg 12.5 mg p.o. b.i.d.  3. Nicoderm patch 21 mg patch once per day to be tapered.  4. Prinivil 10 mg p.o. daily.  5. Lasix 40 mg p.o. daily.  6. Digoxin 0.125 mg p.o. daily.   FOLLOWUP:  Patient is to followup with:  1. [facility] on April 28, 2006 at 9:15 a.m.  2. With Dr. Elnoria Howard, [facility], on April 20, 2006 at 9:30      a.m.  3. Dr. [name] at the [facility] on May 01, 2006 at      10:00 a.m.  4. Patient was given number for Alcoholics Anonymous, that is [phone].   PROCEDURES DONE THIS ADMISSION:  1. Cardiac catheterization done by Dr. Eden Emms on April 09, 2006.  2. EGD done by Dr. Jeani Hawking on April 10, 2006.  3. Colonoscopy done by Dr. Elnoria Howard on April 10, 2006.   CONSULTANTS THIS ADMISSION:  1. Dr. Eden Emms, [facility].  2. Dr. Lewayne Bunting, [facility].  3. Dr. Elnoria Howard, gastroenterology.   IMAGES DONE THIS ADMISSION:  1. Chest x-ray done on April 07, 2006 showing mild interstitial edema and      cardiomegaly.  2. Head and neck CT done on April 11, 2006 showing no acute abnormality.  3. Ultrasound abdomen done on April 13, 2006  showing diffuse      hepatocellular disease suggestive of cirrhosis along with gallbladder      thickening but no gallstones.  4. Chest x-ray done on April 13, 2006 showing improved edema pattern.      And cardiac enlargement stable.  5. 2D echo done on April 07, 2006 showing diminished EF with global      hypokinesis, most marked in the anterior septal wall, EF 30-40%.  Mild      aortic sclerosis and mild calcification of aortic valve, left atrium      markedly to moderately dilated, right ventricle mildly dilated, and      mild to moderate tricuspid regurgitation.   BRIEF HISTORY AND PHYSICAL:  Please see medical record for full detail.  [name] is a 65 year old white man with history of ongoing tobacco and  alcohol abuse last seen by a primary care physician 30 years ago, presenting  with a 1  week history of shortness of breath after experiencing intermittent  episodes of shortness of breath for the last 2-3 months.  Shortness of  breath relieved by rest, denies chest pain, diaphoresis, nausea, vomiting,  fever, or chills.  Patient reports coughing for the last 2 days without  sputum production.  Denies syncope, dizziness, or palpitations.  Patient has  apparently seen a physician at urgent care for the shortness of breath who  has prescribed Lasix, Albuterol, and Prednisone.  Patient also complains of  hoarseness of voice for the last 3 weeks.   ALLERGIES:  No known drug allergies.   PAST MEDICAL HISTORY:  Alcohol and tobacco abuse.   MEDICATIONS:  1. Albuterol inhaler MDI.  2. Prednisone 20 mg p.o. daily for 5 days.  3. Lasix 40 mg p.o. daily.  4. Aspirin 325 mg p.o. daily.   SOCIAL HISTORY:  Patient smokes 3 packs per day for the last 30 years and  drinks 6 pack to a case of beer every day.   PHYSICAL EXAMINATION:  On physical examination, temperature 96.1, blood  pressure 145/105, pulse 103, respiration rate of 24, O2 sats 100% on room  air.  GENERAL APPEARANCE:   Patient did not appear in acute distress.  EYES:  Pupils equal, round, reactive to light.  Extraocular movements  intact.  ENT:  Oropharynx clear, not erythematous.  No adenopathy.  LUNGS:  Scattered rhonchi/crackles, loud upper airway gurgling.  HEART:  Very distant heart sounds, rhythm irregular, no JVD.  ABDOMEN:  Soft, non tender, nondistended, no free fluid, no masses palpated,  bowel sounds present.  EXTREMITIES:  3+ bilateral lower extremity pitting edema.  RECTAL EXAM:  Normal tones and FOBT positive.  SKIN:  Bruising skin on buttocks and dryness.  NEUROLOGICAL:  Strength 5/5 bilaterally in upper extremities and lower  extremities.  Cranial nerves 2-12 grossly intact.  Age normal.   LABORATORY TESTS ON ADMISSION:  BNP of 665, UDS negative, PTT 32, PT 16.3,  INR 1.3, hemoglobin 9.4, white cell 7.0, sodium 141, potassium 3.2, chloride  110, white count 21, BUN 28, creatinine 1.3, glucose 94, total bili 0.5, alk  phos 61, calcium 8.3, AST 25, ALT 11, total protein 6.1, albumin 3.0,  magnesium 2.3.   HOSPITAL COURSE:  1. Non ischemic cardiomyopathy secondary to alcohol abuse:  Given      patient's 2D echo findings and interstitial edema seen on x-ray,      patient underwnet a cardiac catheterization, which revealed a non      ischemic dilated cardiomyopathy with an ejection fraction of 25-30% and      global hypokinesis 1-2+ MR.  After diuresis, patient's medical therapy      was maximized with Lasix, ACE inhibitor, Digoxin, and Coreg.  He was      evaluated by BP physicians as well.  Patient's Coreg dose was increased      to 12.5 b.i.d. and they recommended to continue Lasix 40 mg p.o. daily.  2. Atrial fibrillation:  The time of onset was unclear , as patient has      not seen a doctor in 30 years.  He was not considered for      antiocoagulation or cardioversion secondary to his ongoing alcohol     abuse and dilated cardiomyopathy.  Hence, he was rate controlled only       with Coreg.  3. Iron deficiency anemia secondary to gastritis and gastric ulcer again      secondary to alcohol abuse.  Patient had endoscopy as well as      colonoscopy by Dr. Elnoria Howard during hospitalization.  Results are available      on e-chart.  Patient needs to be on Protonix b.i.d. for at least 1      month, then Protonix once daily.  He was prescribed a high fiber diet      for diverticulosis seen on colonoscopy.  Patient needs to followup with      his primary care physician for routine hemoglobin checks.  4. Cirrhosis secondary to alcohol abuse seen on ultrasound.  There is also      questionable portal hypertension seen on EGD.  Basic treatment is for      patient to stop drinking.  He was given Alcoholics Anonymous number      before discharge.  This needs to be readjusted by primary care      physician on his hospital followup visit.  5. Hoarseness of voice:  Head and neck CT was done to rule out malignancy      given his tobacco and alcohol abuse. Hoarseness resolved with treatment      of COPD and cough.  This needs to be followed up by primary care      physician as well.  He may need referral to ENT if continues.  6. Alcohol abuse:  Patient had no signs of withdrawal during      hospitalization.  Cirrhosis as well as portal hypertension was detected      on EGD and ultrasound.  Again, as stated above, needs to stop drinking      and Alcoholics Anonymous information was provided.  7. Tobacco abuse:  Patient was prescribed nicotine patch during      hospitalization.  He is asked to continue same at home.  He can be      given Chantix in the outpatient clinic during followup as well.   DISCHARGE VITALS:  Temperature 97.7, blood pressure 127/86, pulse 85,  respiratory rate 20, O2 sats 97% on room air.   DISCHARGE LABS:  Sodium 136, potassium 4.3, bi card 31, chloride 103, BUN  16, creatinine 1.0, and glucose of 87, hemoglobin 11.0, hematocrit 33.6, ANC  85.2, platelets 213,  white cell count 8.9.  At the time of discharge,  patient's H. pylori test is still pending.  This needs to be followed up as  an outpatient.      Yetta Barre, M.D.  Electronically Signed      Duncan Dull, M.D.  Electronically Signed    SS/MEDQ  D:  04/14/2006  T:  04/14/2006  Job:  [id number]   cc:   Noralyn Pick. Eden Emms, MD, [facility]  Jordan Hawks. Elnoria Howard, MD

## 2010-11-15 NOTE — Assessment & Plan Note (Signed)
[facility]                            CARDIOLOGY OFFICE NOTE   NAME:[name], [name]                         MRN:          [id number]  DATE:09/15/2006                            DOB:          [birth year]    [name] is a very pleasant gentleman who has a  history of a  nonischemic cardiomyopathy, coronary artery disease with cardiomyopathy  out of proportion, alcohol abuse, atrial fibrillation, GI bleed, and  possible cirrhosis.  Since I last saw him, he has not had anything in  terms of alcohol by his report.  He denies any orthopnea, PND, or pedal  edema, but he does have mild dyspnea with extreme activities.  There is  no chest pain, palpitations, or syncope.  He does continue to smoke.   MEDICATIONS:  1. Coreg 12.5 mg p.o. b.i.d.  2. Lasix 40 mg p.o. daily.  3. Digoxin 0.5 mg p.o. daily.  4. Lisinopril 10 mg b.i.d.  5. Nexium 40 mg p.o. daily.   PHYSICAL EXAM:  Blood pressure 124/77, pulse is 72.  NECK:  Supple.  CHEST:  Clear.  CARDIOVASCULAR:  Irregular rhythm.  There is a 2/6 systolic murmur at  the left sternal border.  EXTREMITIES:  No edema.   ELECTROCARDIOGRAM:  Atrial fibrillation at a rate of 72.  There are no  significant ST changes noted.   DIAGNOSES:  1. Nonischemic cardiomyopathy.  2. Coronary artery disease with cardiomyopathy out of proportion.  3. History of alcohol abuse, now resolved.  4. Atrial fibrillation.  5. History of iron deficiency anemia secondary to gastritis and      gastric ulcer.  6. Tobacco abuse.  7. History of cirrhosis.   PLAN:  [name] is doing well form a symptomatic standpoint.  We will  continue with his present medications for his cardiomyopathy.  I will  increase his lisinopril for his myopathy to 20 mg p.o. b.i.d.  We will  check a BMET in 1 week to follow his potassium and renal function.  Note, he will need an echocardiogram in 3 months since his medications  have now been titrated.   Hopefully, his left ventricular function will  have improved, and he states that he has discontinued his tobacco use  since he was admitted to the hospital back in the fall.  He also remains  in atrial fibrillation.  He was not anticoagulated previously due to his  alcohol abuse.  There was also a history of peptic ulcer disease and GI  bleed.  However, this has apparently been stable and he has been treated  with Nexium for some time now.  He has discontinued his alcohol abuse.  I will obtain all the records from Dr. Haywood Pao office of  gastroenterology.  If there is no contraindication, then we will begin  Coumadin with a goal INR of 2 to 3, and consider cardioversion on the  patient in the future.  We will need to follow his CBC closely after  initiating Coumadin.  Note, he also has coronary artery disease and we  added Zocor previously, but this  caused myalgias.  I will try Pravachol  20 mg p.o. daily.  We will check lipids and liver in 6 weeks if he  tolerates.  We discussed the importance of discontinuing his tobacco  use.  He will see me back in 8 weeks.     Madolyn Frieze [name], MD, [facility]  Electronically Signed    BSC/MedQ  DD: 09/15/2006  DT: 09/15/2006  Job #: [id number]

## 2010-11-15 NOTE — Consult Note (Signed)
[name]                ACCOUNT NO.:  [id number]   MEDICAL RECORD NO.:  [id number]          PATIENT TYPE:  INP   LOCATION:  2012                         FACILITY:  [facility]   PHYSICIAN:  Jordan Hawks. Elnoria Howard, MD    DATE OF BIRTH:  Oct 20, 1945   DATE OF CONSULTATION:  04/09/2006  DATE OF DISCHARGE:                                   CONSULTATION   REASON FOR CONSULTATION:  Heme-positive stool and iron deficiency anemia  with a history of alcohol abuse.   This is a 65 year old gentleman with a past medical history of alcohol abuse  who was found to be in atrial fibrillation with complaints of shortness of  breath.  The patient is unable to provide a significant history at this time  because of his altered mentation, which is possibly secondary to  medications.  From what can be gathered from the chart, the patient did  present with shortness of breath and subsequently was admitted for further  evaluation and treatment.  Laboratory values revealed that he had a  decreased hemoglobin in the 9 range, and subsequent workup revealed a low  ferritin at 14 and an iron saturation of 5%.  The patient was reported to  have some lower extremity edema and some sternal chest pressure associated  with shortness of breath that occurred once every 2-3 weeks, and he was  provided with Lasix, as well as prednisone, on an outpatient basis.  During  the inpatient evaluation, he was noted to have a global hypokinesis with an  EF of 30-40% and atrial fibrillation, but there were no acute changes on his  EKG.  He underwent a cardiac catheterization, and he is reportedly to have  negative coronary artery disease at this time.  GI evaluation is requested  because of iron deficiency anemia and heme-positive stool.  Additionally,  the patient does have a history of using aspirin every 3-4 hours for  presumably arthritis type of pain.   PAST MEDICAL HISTORY/PAST SURGICAL HISTORY:  As stated above.   ALLERGIES:  No known drug allergies.   MEDICATIONS:  Albuterol, carvedilol, digoxin, folic acid, Lasix, Atrovent,  lisinopril, multivitamin, nicotine patch, Protonix, Reglan and Phenergan.   FAMILY HISTORY:  Unable to be obtained.   SOCIAL HISTORY:  Also unable to be obtained at this time.   REVIEW OF SYSTEMS:  Unable to be obtained aside from what was gleaned from  the current notes.   PHYSICAL EXAMINATION:  VITAL SIGNS:  Blood pressure is 105/70.  Heart rate  79.  Respirations 20.  Temperature is 96.8.  Pulse oximetry on room air is  97%.  GENERAL:  The patient is in no acute distress; however, he is very somnolent  and difficult to arouse at this time.  HEENT:  Normocephalic, atraumatic.  NECK:  Supple.  No lymphadenopathy.  LUNGS:  Clear to auscultation bilaterally.  CARDIOVASCULAR:  Irregularly irregular.  ABDOMEN:  Mildly obese, soft, nontender, nondistended.  EXTREMITIES:  No clubbing, cyanosis or edema.  SKIN:  There are a few spider angiomas on his anterior chest.   LABORATORY VALUES:  On April 09, 2006, white blood cell count is 11.0,  hemoglobin 9.6, platelets at 228.  Sodium is 141, potassium 4.1, chloride is  107, CO2 25, BUN is 33, creatinine 1.4, glucose is 90.  PT is 16.5, INR of  1.5, PTT is 32.   IMPRESSION:  1. Iron deficiency anemia.  2. Heme-positive stool.  3. History of alcohol abuse.  4. History of heavy nonsteroidal anti-inflammatory drug use.  5. Atrial fibrillation status post cardiac catheterization, which was      negative for coronary artery disease.  6. Altered mental status, questionable secondary to medications versus      another source.   Further evaluation with an EGD and colonoscopy is required at this time;  however, because of his change in mentation, I do not believe he can prep  for a colonoscopy adequately at this time.  Further evaluation is required  in regard to his mentation, but again, this can be secondary to the Librium   that he is on for prophylaxis of alcohol withdrawal.  Plan is to perform an  EGD and colonoscopy when mentation is improved.  I will reevaluate the  patient tomorrow morning.  We will check an ammonia level, as well as  __________  medications.      Jordan Hawks Elnoria Howard, MD  Electronically Signed     PDH/MEDQ  D:  04/09/2006  T:  04/10/2006  Job:  [id number]

## 2010-11-15 NOTE — Cardiovascular Report (Signed)
[name], [name]                ACCOUNT NO.:  [id number]   MEDICAL RECORD NO.:  [id number]          PATIENT TYPE:  INP   LOCATION:  2012                         FACILITY:  [facility]   PHYSICIAN:  Noralyn Pick. Eden Emms, MD, FACCDATE OF BIRTH:  14-Sep-1945   DATE OF PROCEDURE:  04/09/2006  DATE OF DISCHARGE:                              CARDIAC CATHETERIZATION   CORONARY ARTERIOGRAPHY   INDICATION:  Cardiomyopathy, question alcoholic versus ischemic; new-onset  atrial fibrillation.   [name] is an unfortunate 65 year old gentleman who drinks quite a bit.  He was admitted to the hospital somewhat disheveled in what appeared to be  newly diagnosed atrial fibrillation.  His echocardiogram showed an EF of 30-  35% with MR.  The study was done to assess the etiology of cardiomyopathy.  When the patient arrived in the laboratory he was very lethargic, he was  responsive but clearly was not the same as when I saw him previously.  He  had no focal abnormalities.  We decided to proceed with a left heart  catheterization but did not to the right heart catheterization because of  the increased risk of bleeding in a less than cooperative patient.  The  patient was talked to at length with by myself and the PA yesterday in front  of the family in regards to consent.   The patient was given 0.4 Romazicon prior to the procedure with little  change in his mental status.   Left main coronary artery had a 20% discrete stenosis.   Left anterior descending artery had 30% tubular disease proximally.  There  was a 60% mid vessel lesion and 60-70% distal disease.  The first diagonal  branch had 30-40% tubular disease.   The circumflex coronary artery was nondominant.  There were 30% multiple  discrete lesions in the proximal and mid vessel.  The first obtuse marginal  branch had 40% tubular disease.   The right coronary artery was dominant.  There was 30-40% eccentric disease  in the mid vessel.  There  was diffuse distal PDA and posterolateral disease  in less than 1.5-mm vessels.   RAO ventriculography showed global hypokinesis with an EF of 25-30%.  There  was angiographic grade +1-2 MR.   Aortic pressure is 120/75, LV pressure is 120/18.   We used Angio-Seal in the right femoral artery with good hemostasis.   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  He is not a candidate for  Coumadin right now.   The patient needs his anemia worked up.  He also needs his mental status  worked up to be wary of alcohol withdrawal syndrome.   He should be on Coreg as his beta blocker since he has atrial fibrillation  and nonischemic cardiomyopathy, ACE inhibitor as tolerated by blood  pressure.   Again, the patient is not currently a candidate for cardioversion or  Coumadin therapy and needs his other medical conditions that are sequelae  from his alcoholism worked up.           ______________________________  Noralyn Pick. Eden Emms, MD, [facility]     PCN/MEDQ  D:  04/09/2006  T:  04/10/2006  Job:  [id number]

## 2010-11-15 NOTE — Assessment & Plan Note (Signed)
[facility]                            CARDIOLOGY OFFICE NOTE   NAME:Faiella, PHIL                         MRN:          [id number]  DATE:06/17/2006                            DOB:          1945-09-12    Mr. Weese is a gentleman, who was admitted to [facility]  previously with shortness of breath.  He was found to have a non-  ischemic cardiomyopathy.  An echocardiogram, performed on April 07, 2006, showed an ejection fraction of 30-40%.  There was mild to moderate  mitral regurgitation.  The left atrium was moderately to markedly  dilated and the right ventricle was mildly dilated.  There was mild to  moderate tricuspid regurgitation, as well.  The patient subsequently  underwent cardiac catheterization by Dr. Eden Emms on April 09, 2006.  He  was found to have moderate coronary disease, but his cardiomyopathy was  out of proportion to his coronary disease and felt related to his  alcohol.  His ejection fraction on catheterization was 25-30%.  There  was 1 to 2+ mitral regurgitation.  The patient was not felt to be a  Coumadin candidate, secondary to his alcohol abuse.  He also had a  history of ulcers and GI bleed.  He has, therefore, been treated  medically for his non-ischemic cardiomyopathy and atrial fibrillation.  Since he was last seen, he is doing well.  There is mild dyspnea on  exertion, but there is no orthopnea, PND, pedal edema, palpitations,  presyncope, syncope or chest pain.  He has discontinued his alcohol  abuse, by his report.  He is smoking approximately ten cigarettes per  day.   MEDICATIONS INCLUDE:  1. Prilosec 40 mg p.o. b.i.d.  2. Nicoderm patch.  3. Lasix 40 mg p.o. daily.  4. Digoxin 0.125 mg p.o. daily.  5. Albuterol.  6. Lisinopril 10 mg p.o. daily.   PHYSICAL EXAMINATION:  His physical exam today shows a blood pressure of  120/69, a pulse of 67.  He weighs 167 pounds.  NECK:  Supple.  CHEST:  Clear.  CARDIOVASCULAR EXAM:  There is an irregular rhythm.  EXTREMITIES:  Show no edema.   His electrocardiogram shows atrial fibrillation with a controlled  ventricular response of 69.  The axis is normal.  There are nonspecific  ST changes.   DIAGNOSES:  1. Non-ischemic cardiomyopathy.  2. Alcohol abuse.  3. Atrial fibrillation.  4. Iron deficiency anemia, secondary to gastritis and gastric ulcer.  5. Tobacco abuse.  6. History of cirrhosis, secondary to alcohol.   PLAN:  Mr. Vacca appears to be doing well from a symptomatic  standpoint.  I have asked him to increase his lisinopril to 10 mg p.o.  b.i.d.  We will continue with his Coreg and Digoxin, as well as his  Lasix.  Note:  He is not on aspirin or Coumadin.  He is felt not to be a  candidate for Coumadin at this point, secondary to his history of  alcohol abuse.  He is not on aspirin, secondary to his recent gastritis  and gastric ulcer.  We will see him back in three months and we will  reconsider that, if he continues off his alcohol.  He will need a  followup echocardiogram once his medications have been fully titrated.  If his EF remains less than 30% and he remains off alcohol, then he will  need to be considered for an ICD.  We discussed the importance of  discontinuing his tobacco use.  I have added Zocor, given his moderate  coronary disease on recent catheterization and we will check lipids and  liver in six weeks and I will also check his renal function at that  time.  I will also check his renal function today.  He will see me back  in three months.     Madolyn Frieze [name], MD, [facility]  Electronically Signed    BSC/MedQ  DD: 06/17/2006  DT: 06/17/2006  Job #: [id number]

## 2010-11-15 NOTE — Consult Note (Signed)
[name], [name]                ACCOUNT NO.:  [id number]   MEDICAL RECORD NO.:  [id number]          PATIENT TYPE:  INP   LOCATION:  2012                         FACILITY:  [facility]   PHYSICIAN:  Noralyn Pick. Eden Emms, MD, FACCDATE OF BIRTH:  [birth year]   DATE OF CONSULTATION:  04/08/2006  DATE OF DISCHARGE:                                   CONSULTATION   CARDIOLOGIST:  The patient is new to [facility], being seen by Dr.  Eden Emms.   PATIENT PROFILE:  A 65 year old white male with a prior history of tobacco  abuse who presents with progressive dyspnea and newly found atrial  fibrillation and cardiomyopathy.   PROBLEMS:  1. Cardiomyopathy.      a.     April 07, 2006 - 2-D echocardiogram.  EF of 30-40%, global       hypokinesis, worse at the anteroseptal wall.  Mild aortic sclerosis.       Mild to moderate MR/TR.  Moderately to markedly dilated left atrium.       Moderately dilated right ventricle.  2. CHF.  3. Atrial fibrillation.  4. Ongoing tobacco abuse.  5. ETOH abuse.  6. Hypokalemia.   HISTORY OF PRESENT ILLNESS:  A 65 year old white male with a prior history  of tobacco abuse.  No prior history of CAD.  He reports a 3-4 month history  of progressive dyspnea on exertion, now appearing after walking only  approximately 20 yards.  He has also had lower extremity edema and  exertional 3/10 substernal chest pressure associated with shortness of  breath, occurring once every 2-3 weeks, lasting approximately 3-5 minutes  and resolving with rest.  He has been seen in urgent care on several  occasions and treated with Lasix and prednisone with continued decline and  condition, thus prompting him presenting to the [facility] emergency  department on April 07, 2006, when he was admitted for management of CHF.  A 2-D echocardiogram was performed yesterday revealing an EF of 30-40% with  global hypokinesis.  Also of note, his admission ECG shows atrial  fibrillation which happens  to be well rate controlled, despite not being on  any rate controlling agent.  He has no acute ST-T changes on ECG.  His chest  x-ray yesterday showed cardiomegaly, but was otherwise normal, but this  morning shows mild edema, and his BNP is now elevated at 9:48.  We are asked  to consult.   ALLERGIES:  No known drug allergies.   CURRENT MEDICATIONS:  1. Albuterol inhaler q.6h.  2. Folic acid 1 mg daily.  3. Lasix 80 mg IV b.i.d.  4. Atrovent inhaler q.6h.  5. Lisinopril 10 mg daily.  6. Multivitamin one daily.  7. Nicotine patch 21 mg daily.  8. Protonix 40 mg b.i.d.  9. K-Dur 40 mEq t.i.d.  10.Thiamine 100 mg daily.  11.Librium p.r.n.   FAMILY HISTORY:  Mother is alive at age 58 with a history of osteoarthritis.  Father died at age 79 of an MI.  He has a brother who is 82 and alive and  well.  SOCIAL HISTORY:  He lives in [place] with his mother.  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  He has a 45-90 pack year history of  tobacco abuse, smoking anywhere between a pack and a half to 3 packs a day  over the past 30 years.  He drinks approximately a 6-pack of beer a day.  He  denies any drug use, and does not routinely exercise, but exerts himself  quite a bit at work.   REVIEW OF SYSTEMS:  Positive for chest pain, shortness of breath, dyspnea on  exertion and lower extremity edema.  He also has a fairly noisy upper  airway, which he thinks he has had for a long time, but he is not sure if he  ever broke his nose in the past.  All other systems reviewed and negative.   PHYSICAL EXAMINATION:  VITAL SIGNS:  Temperature 98.3, heart rate 96,  respirations 20, blood pressure 134/90, pulse oximetry 98% on room air.  GENERAL:  A pleasant white male in no acute distress.  Awake, alert and  oriented x3.  NECK:  Normal carotid upstrokes with mildly elevated JVP.  CARDIAC:  Irregularly irregular S1 and S2.  He has distant heart sounds with  a soft 2/6 systolic murmur at  the apex.  LUNGS:  He has a few crackles in the bases.  Scattered rhonchi.  He has a  lot of upper airway noise.  ABDOMEN:  Round, soft, nontender, nondistended.  Bowel sounds present x4.  EXTREMITIES:  Warm, dry, pink.  He has 2 to 3+ bilateral lower extremity  edema.  NEUROLOGIC:  Grossly intact and nonfocal.  SKIN:  Warm and dry without lesions or masses.  HEENT:  Atraumatic and normocephalic.   CHEST-RAY:  Chest x-ray today showed cardiomegaly with question of mild  interstitial edema.  His EKG shows atrial fibrillation with a rate of 86 and  no acute ST-T changes.  Hemoglobin 9.6 hematocrit 30.0, WBCs 10.4, platelets  218.  Sodium 140, potassium 3.3, chloride 109, CO2 of 21, BUN 28, creatinine  1.2, glucose 93.  Total bilirubin 0.4, alkaline phosphatase 61, AST 25, ALT  11.  BNP 948, up from 665.  Total protein 6.1, albumin 3.0.  CK of 152, MB  3.4, troponin I of 0.03.  B-12 of 586, ferritin 14.  Fecal occult blood is  positive.  TSH of 1.050.  Total cholesterol of 117, triglycerides 63, LDH  30, LDL 174.   ASSESSMENT AND PLAN:  1. Cardiomyopathy/acute systolic congestive heart failure, question      ischemic versus primary dilated (ETOH).  Plan right and left heart      catheterization tomorrow.  Will add a beta blocker, as he is not      actively wheezing, and will use a beta I selective agent.  Continue ACE      inhibitor.  Will decrease his IV Lasix to 40 mg b.i.d.  2. Atrial fibrillation.  Will add a low-dose beta blocker as above, and      titrate as tolerated.  Plan on anticoagulation secondary to low      ejection fraction with dilated left atrium, once GI work-up is      complete.  His TSH is within normal limits.  3. Tobacco/ETOH abuse.  Cessation strongly advised.  Agree with continued      nicotine patch, as well as DT prophylaxis.  4. Iron-deficiency anemia.  He is fecal occult blood positive.  His  ferritin is only 14, and his B-12 is normal.  The patient will  need a      GI evaluation, especially as we are planning on initiation of long-term      anticoagulation, secondary to his atrial fibrillation.  5. Hypokalemia, replete as ordered by the primary team.  6. Mild to moderate mitral regurgitation.  He will have a right heart      catheterization, along with his left heart catheterization tomorrow.  7. Upper airway noise.  The patient would probable benefit from outpatient      ENT evaluation in the future.     ______________________________  Nicolasa Ducking, ANP    ______________________________  Noralyn Pick. Eden Emms, MD, [facility]    CB/MEDQ  D:  04/08/2006  T:  04/09/2006  Job:  [id number]

## 2010-11-20 ENCOUNTER — Encounter: Payer: Self-pay | Admitting: Internal Medicine

## 2010-12-02 ENCOUNTER — Other Ambulatory Visit (HOSPITAL_COMMUNITY): Payer: Self-pay | Admitting: Specialist

## 2010-12-02 DIAGNOSIS — S83249A Other tear of medial meniscus, current injury, unspecified knee, initial encounter: Secondary | ICD-10-CM

## 2010-12-05 ENCOUNTER — Other Ambulatory Visit: Payer: Self-pay | Admitting: Cardiology

## 2010-12-06 ENCOUNTER — Ambulatory Visit (HOSPITAL_COMMUNITY)
Admission: RE | Admit: 2010-12-06 | Discharge: 2010-12-06 | Disposition: A | Discharge status: Home / Self Care | Payer: PRIVATE HEALTH INSURANCE | Source: Ambulatory Visit | Attending: Specialist | Admitting: Specialist

## 2010-12-06 DIAGNOSIS — S83249A Other tear of medial meniscus, current injury, unspecified knee, initial encounter: Secondary | ICD-10-CM

## 2010-12-06 DIAGNOSIS — M899 Disorder of bone, unspecified: Secondary | ICD-10-CM | POA: Insufficient documentation

## 2010-12-06 DIAGNOSIS — M25559 Pain in unspecified hip: Secondary | ICD-10-CM | POA: Insufficient documentation

## 2010-12-06 DIAGNOSIS — Q682 Congenital deformity of knee: Secondary | ICD-10-CM | POA: Insufficient documentation

## 2010-12-06 DIAGNOSIS — M23329 Other meniscus derangements, posterior horn of medial meniscus, unspecified knee: Secondary | ICD-10-CM | POA: Insufficient documentation

## 2010-12-06 DIAGNOSIS — M949 Disorder of cartilage, unspecified: Secondary | ICD-10-CM | POA: Insufficient documentation

## 2010-12-18 ENCOUNTER — Telehealth: Payer: Self-pay | Admitting: Cardiology

## 2010-12-18 NOTE — Telephone Encounter (Signed)
Walk In pt Form " pt needs form completed dropped off from [facility]" sent to Message Nurse 12/18/10/km

## 2011-01-09 ENCOUNTER — Encounter: Payer: Self-pay | Admitting: Internal Medicine

## 2011-01-20 ENCOUNTER — Telehealth: Payer: Self-pay | Admitting: Cardiology

## 2011-01-20 NOTE — Telephone Encounter (Signed)
All Cardiac faxed to [facility] @  [phone]  01/20/11/km

## 2011-01-23 ENCOUNTER — Other Ambulatory Visit: Payer: Self-pay | Admitting: Specialist

## 2011-01-23 ENCOUNTER — Encounter (HOSPITAL_COMMUNITY): Payer: PRIVATE HEALTH INSURANCE

## 2011-01-23 ENCOUNTER — Other Ambulatory Visit (HOSPITAL_COMMUNITY): Payer: Self-pay | Admitting: Specialist

## 2011-01-23 ENCOUNTER — Ambulatory Visit (HOSPITAL_COMMUNITY)
Admission: RE | Admit: 2011-01-23 | Discharge: 2011-01-23 | Disposition: A | Discharge status: Home / Self Care | Payer: PRIVATE HEALTH INSURANCE | Source: Ambulatory Visit | Attending: Specialist | Admitting: Specialist

## 2011-01-23 DIAGNOSIS — Z01818 Encounter for other preprocedural examination: Secondary | ICD-10-CM | POA: Insufficient documentation

## 2011-01-23 DIAGNOSIS — Z01812 Encounter for preprocedural laboratory examination: Secondary | ICD-10-CM | POA: Insufficient documentation

## 2011-01-23 LAB — BASIC METABOLIC PANEL
BUN: 44 mg/dL — ABNORMAL HIGH (ref 6–23)
CO2: 19 mEq/L (ref 19–32)
Calcium: 9.3 mg/dL (ref 8.4–10.5)
Chloride: 103 mEq/L (ref 96–112)
Creatinine, Ser: 1.67 mg/dL — ABNORMAL HIGH (ref 0.50–1.35)
GFR calc Af Amer: 51 mL/min — ABNORMAL LOW (ref >60)
GFR calc non Af Amer: 42 mL/min — ABNORMAL LOW (ref >60)
Glucose, Bld: 88 mg/dL (ref 70–99)
Potassium: 5.5 mEq/L — ABNORMAL HIGH (ref 3.5–5.1)
Sodium: 133 mEq/L — ABNORMAL LOW (ref 135–145)

## 2011-01-23 LAB — CBC
HCT: 34.7 % — ABNORMAL LOW (ref 39.0–52.0)
Hemoglobin: 11.6 g/dL — ABNORMAL LOW (ref 13.0–17.0)
MCH: 31.2 pg (ref 26.0–34.0)
MCHC: 33.4 g/dL (ref 30.0–36.0)
MCV: 93.3 fL (ref 78.0–100.0)
Platelets: 207 10*3/uL (ref 150–400)
RBC: 3.72 MIL/uL — ABNORMAL LOW (ref 4.22–5.81)
RDW: 13.7 % (ref 11.5–15.5)
WBC: 10.1 10*3/uL (ref 4.0–10.5)

## 2011-01-23 LAB — SURGICAL PCR SCREEN
MRSA, PCR: NEGATIVE
Staphylococcus aureus: POSITIVE — AB

## 2011-01-24 ENCOUNTER — Telehealth: Payer: Self-pay | Admitting: Internal Medicine

## 2011-01-24 ENCOUNTER — Telehealth: Payer: Self-pay | Admitting: Cardiology

## 2011-01-30 ENCOUNTER — Ambulatory Visit (HOSPITAL_COMMUNITY)
Admission: RE | Admit: 2011-01-30 | Discharge: 2011-01-30 | Disposition: A | Discharge status: Home / Self Care | Payer: PRIVATE HEALTH INSURANCE | Source: Ambulatory Visit | Attending: Specialist | Admitting: Specialist

## 2011-01-30 DIAGNOSIS — M2340 Loose body in knee, unspecified knee: Secondary | ICD-10-CM | POA: Insufficient documentation

## 2011-01-30 DIAGNOSIS — M2240 Chondromalacia patellae, unspecified knee: Secondary | ICD-10-CM | POA: Insufficient documentation

## 2011-01-30 DIAGNOSIS — Z01812 Encounter for preprocedural laboratory examination: Secondary | ICD-10-CM | POA: Insufficient documentation

## 2011-01-30 DIAGNOSIS — I509 Heart failure, unspecified: Secondary | ICD-10-CM | POA: Insufficient documentation

## 2011-01-30 DIAGNOSIS — I10 Essential (primary) hypertension: Secondary | ICD-10-CM | POA: Insufficient documentation

## 2011-01-30 DIAGNOSIS — Z01818 Encounter for other preprocedural examination: Secondary | ICD-10-CM | POA: Insufficient documentation

## 2011-01-30 DIAGNOSIS — IMO0002 Reserved for concepts with insufficient information to code with codable children: Secondary | ICD-10-CM | POA: Insufficient documentation

## 2011-01-30 DIAGNOSIS — F17200 Nicotine dependence, unspecified, uncomplicated: Secondary | ICD-10-CM | POA: Insufficient documentation

## 2011-01-30 DIAGNOSIS — X58XXXA Exposure to other specified factors, initial encounter: Secondary | ICD-10-CM | POA: Insufficient documentation

## 2011-01-30 HISTORY — PX: KNEE ARTHROSCOPY WITH MENISCAL REPAIR: SHX5653

## 2011-01-30 LAB — BASIC METABOLIC PANEL
BUN: 40 mg/dL — ABNORMAL HIGH (ref 6–23)
CO2: 20 mEq/L (ref 19–32)
Calcium: 9.8 mg/dL (ref 8.4–10.5)
Chloride: 109 mEq/L (ref 96–112)
Creatinine, Ser: 1.39 mg/dL — ABNORMAL HIGH (ref 0.50–1.35)
GFR calc Af Amer: >60 mL/min (ref >60)
GFR calc non Af Amer: 52 mL/min — ABNORMAL LOW (ref >60)
Glucose, Bld: 101 mg/dL — ABNORMAL HIGH (ref 70–99)
Potassium: 5.6 mEq/L — ABNORMAL HIGH (ref 3.5–5.1)
Sodium: 138 mEq/L (ref 135–145)

## 2011-02-06 NOTE — Op Note (Signed)
  NAMETRAVER, MECKES                ACCOUNT NO.:  [id number]  MEDICAL RECORD NO.:  [id number]  LOCATION:  DAYL                         FACILITY:  [facility]  PHYSICIAN:  Jene Every, M.D.    DATE OF BIRTH:  07-28-1945  DATE OF PROCEDURE:  01/30/2011 DATE OF DISCHARGE:  01/30/2011                              OPERATIVE REPORT   PREOPERATIVE DIAGNOSIS:  Medial meniscus tear of the right knee.  POSTOPERATIVE DIAGNOSES:  Medial meniscus tear of the right knee; grade 4 chondromalacia of medial compartment; extensive grade 3 changes of medial compartment, patellofemoral joint.  PROCEDURES PERFORMED: 1. Right knee arthroscopy. 2. Partial medial meniscectomy. 3. Chondroplasty of the medial femoral condyle, patella. 4. Debridement of lateral joint meniscus.  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  MRI indicated meniscus tear.  Failing of conservative treatment is indicated for arthroscopic debridement and meniscectomy.  Risks and benefits discussed including bleeding, infection, damage to neurovascular structures, no change in symptoms, worsening symptoms, need for repeat debridement, DVT, PE, anesthetic complications etc.  TECHNIQUE:  With the patient in supine position after induction of adequate general anesthesia and 1 gram Kefzol, the right lower extremity was prepped and draped in the usual sterile fashion.  A lateral parapatellar portal was fashioned with a #11 blade.  Ingress cannula atraumatically placed.  Irrigant was utilized to insufflate the joint. Under direct visualization, a medial parapatellar portal was fashioned with a #11 blade after localization with an 18-gauge needle sparing the medial meniscus.  There was extensive grade 3 changes of femoral condyle, tibial plateau, and there were grade 4 changes of  the 30% of the medial tibial plateau.  Loose cartilaginous bodies were noted as well as an unstable meniscus tear.  Basket rongeur introduced and utilized to perform partial medial meniscectomy to stable base, further contoured with a 3.55 shaver.  Light chondroplasty performed of the tibial plateau and femoral condyle.  ACL was unremarkable.  Lateral compartment; mild degenerative changes, fraying of the meniscus was shaved.  Suprapatellar pouch revealed grade 3 changes of the patella, normal patellofemoral tracking.  Light chondroplasty of patella performed. Gutters were unremarkable.  Revisited all 3 compartments.  No further pathology amenable to arthroscopic intervention.  I therefore removed all instrumentation. Portals were closed with 4-0 nylon simple sutures.  Marcaine 0.25% with epinephrine was infiltrated into the joint.  The wound was dressed sterilely.  Awoken without difficulty and transported to the recovery room in satisfactory condition.  The patient tolerated the procedure well.  There were no complications.  Jene Every, M.D.     Cordelia Pen  D:  01/30/2011  T:  01/31/2011  Job:  [id number]  Electronically Signed by Jene Every M.D. on 02/06/2011 07:03:14 AM

## 2011-02-25 ENCOUNTER — Ambulatory Visit: Payer: PRIVATE HEALTH INSURANCE | Attending: Specialist

## 2011-02-25 DIAGNOSIS — IMO0001 Reserved for inherently not codable concepts without codable children: Secondary | ICD-10-CM | POA: Insufficient documentation

## 2011-02-25 DIAGNOSIS — M6281 Muscle weakness (generalized): Secondary | ICD-10-CM | POA: Insufficient documentation

## 2011-02-25 DIAGNOSIS — R262 Difficulty in walking, not elsewhere classified: Secondary | ICD-10-CM | POA: Insufficient documentation

## 2011-02-25 DIAGNOSIS — M25569 Pain in unspecified knee: Secondary | ICD-10-CM | POA: Insufficient documentation

## 2011-02-26 ENCOUNTER — Ambulatory Visit: Payer: PRIVATE HEALTH INSURANCE

## 2011-03-05 ENCOUNTER — Ambulatory Visit: Payer: PRIVATE HEALTH INSURANCE | Attending: Specialist

## 2011-03-05 ENCOUNTER — Other Ambulatory Visit: Payer: Self-pay | Admitting: Cardiology

## 2011-03-05 DIAGNOSIS — M25569 Pain in unspecified knee: Secondary | ICD-10-CM | POA: Insufficient documentation

## 2011-03-05 DIAGNOSIS — IMO0001 Reserved for inherently not codable concepts without codable children: Secondary | ICD-10-CM | POA: Insufficient documentation

## 2011-03-05 DIAGNOSIS — M6281 Muscle weakness (generalized): Secondary | ICD-10-CM | POA: Insufficient documentation

## 2011-03-05 DIAGNOSIS — R262 Difficulty in walking, not elsewhere classified: Secondary | ICD-10-CM | POA: Insufficient documentation

## 2011-03-07 ENCOUNTER — Ambulatory Visit: Payer: PRIVATE HEALTH INSURANCE

## 2011-03-11 ENCOUNTER — Ambulatory Visit: Payer: PRIVATE HEALTH INSURANCE

## 2011-03-13 ENCOUNTER — Ambulatory Visit: Payer: PRIVATE HEALTH INSURANCE

## 2011-03-24 LAB — DIFFERENTIAL
Basophils Absolute: 0.1
Basophils Relative: 1
Eosinophils Absolute: 0.6
Eosinophils Relative: 6 — ABNORMAL HIGH
Lymphocytes Relative: 22
Lymphs Abs: 2.3
Monocytes Absolute: 0.6
Monocytes Relative: 6
Neutro Abs: 6.9
Neutrophils Relative %: 65

## 2011-03-24 LAB — COMPREHENSIVE METABOLIC PANEL
ALT: 25
AST: 27
Albumin: 3.8
Alkaline Phosphatase: 61
BUN: 30 — ABNORMAL HIGH
CO2: 21
Calcium: 8.9
Chloride: 110
Creatinine, Ser: 1.14
GFR calc Af Amer: >60
GFR calc non Af Amer: >60
Glucose, Bld: 92
Potassium: 4.7
Sodium: 137
Total Bilirubin: 0.6
Total Protein: 7.2

## 2011-03-24 LAB — CBC
HCT: 36.2 — ABNORMAL LOW
Hemoglobin: 12.2 — ABNORMAL LOW
MCHC: 33.7
MCV: 95.6
Platelets: 231
RBC: 3.78 — ABNORMAL LOW
RDW: 13.5
WBC: 10.5

## 2011-03-24 LAB — URINALYSIS, ROUTINE W REFLEX MICROSCOPIC
Bilirubin Urine: NEGATIVE
Glucose, UA: NEGATIVE
Hgb urine dipstick: NEGATIVE
Ketones, ur: NEGATIVE
Nitrite: NEGATIVE
Protein, ur: NEGATIVE
Specific Gravity, Urine: 1.019
Urobilinogen, UA: 0.2
pH: 5.5

## 2011-03-24 LAB — TYPE AND SCREEN
ABO/RH(D): B NEG
Antibody Screen: NEGATIVE

## 2011-03-24 LAB — APTT: aPTT: 32

## 2011-03-24 LAB — PROTIME-INR
INR: 1.1
Prothrombin Time: 14

## 2011-03-24 LAB — ABO/RH: ABO/RH(D): B NEG

## 2011-05-16 ENCOUNTER — Encounter (HOSPITAL_COMMUNITY): Payer: Self-pay | Admitting: Pharmacist

## 2011-05-20 ENCOUNTER — Encounter (HOSPITAL_COMMUNITY): Payer: Self-pay

## 2011-05-20 ENCOUNTER — Encounter (HOSPITAL_COMMUNITY)
Admission: RE | Admit: 2011-05-20 | Discharge: 2011-05-20 | Disposition: A | Discharge status: Home / Self Care | Payer: PRIVATE HEALTH INSURANCE | Source: Ambulatory Visit | Attending: Orthopedic Surgery | Admitting: Orthopedic Surgery

## 2011-05-20 HISTORY — DX: Other complications of anesthesia, initial encounter: T88.59XA

## 2011-05-20 HISTORY — DX: Adverse effect of unspecified anesthetic, initial encounter: T41.45XA

## 2011-05-20 LAB — COMPREHENSIVE METABOLIC PANEL
ALT: 15 U/L (ref 0–53)
AST: 21 U/L (ref 0–37)
Albumin: 3.7 g/dL (ref 3.5–5.2)
Alkaline Phosphatase: 47 U/L (ref 39–117)
BUN: 23 mg/dL (ref 6–23)
CO2: 22 mEq/L (ref 19–32)
Calcium: 9.1 mg/dL (ref 8.4–10.5)
Chloride: 106 mEq/L (ref 96–112)
Creatinine, Ser: 1.06 mg/dL (ref 0.50–1.35)
GFR calc Af Amer: 84 mL/min — ABNORMAL LOW (ref >90)
GFR calc non Af Amer: 73 mL/min — ABNORMAL LOW (ref >90)
Glucose, Bld: 83 mg/dL (ref 70–99)
Potassium: 4.8 mEq/L (ref 3.5–5.1)
Sodium: 137 mEq/L (ref 135–145)
Total Bilirubin: 0.3 mg/dL (ref 0.3–1.2)
Total Protein: 7 g/dL (ref 6.0–8.3)

## 2011-05-20 LAB — CBC
HCT: 37.9 % — ABNORMAL LOW (ref 39.0–52.0)
Hemoglobin: 13 g/dL (ref 13.0–17.0)
MCH: 32.6 pg (ref 26.0–34.0)
MCHC: 34.3 g/dL (ref 30.0–36.0)
MCV: 95 fL (ref 78.0–100.0)
Platelets: 203 10*3/uL (ref 150–400)
RBC: 3.99 MIL/uL — ABNORMAL LOW (ref 4.22–5.81)
RDW: 12.8 % (ref 11.5–15.5)
WBC: 8.6 10*3/uL (ref 4.0–10.5)

## 2011-05-20 LAB — URINALYSIS, ROUTINE W REFLEX MICROSCOPIC
Bilirubin Urine: NEGATIVE
Glucose, UA: NEGATIVE mg/dL
Hgb urine dipstick: NEGATIVE
Ketones, ur: NEGATIVE mg/dL
Leukocytes, UA: NEGATIVE
Nitrite: NEGATIVE
Protein, ur: NEGATIVE mg/dL
Specific Gravity, Urine: 1.006 (ref 1.005–1.030)
Urobilinogen, UA: 0.2 mg/dL (ref 0.0–1.0)
pH: 6 (ref 5.0–8.0)

## 2011-05-20 LAB — SURGICAL PCR SCREEN
MRSA, PCR: NEGATIVE
Staphylococcus aureus: POSITIVE — AB

## 2011-05-20 LAB — PROTIME-INR
INR: 1.14 (ref 0.00–1.49)
Prothrombin Time: 14.8 seconds (ref 11.6–15.2)

## 2011-05-20 LAB — APTT: aPTT: 29 seconds (ref 24–37)

## 2011-05-20 NOTE — Pre-Procedure Instructions (Signed)
20 [name]  05/20/2011   Your procedure is scheduled on:  05/29/11  Report to [facility] at 530 AM.  Call this number if you have problems the morning of surgery: [phone]   Remember:   Do not eat food:After Midnight.  Do not drink clear liquids: 4 Hours before arrival.  Take these medicines the morning of surgery with A SIP OF WATER: coreg   Do not wear jewelry, make-up or nail polish.  Do not wear lotions, powders, or perfumes. You may wear deodorant.  Do not shave 48 hours prior to surgery.  Do not bring valuables to the hospital.  Contacts, dentures or bridgework may not be worn into surgery.  Leave suitcase in the car. After surgery it may be brought to your room.  For patients admitted to the hospital, checkout time is 11:00 AM the day of discharge.   Patients discharged the day of surgery will not be allowed to drive home.  Name and phone number of your driver: family  Special Instructions: Incentive Spirometry - Practice and bring it with you on the day of surgery. and CHG Shower Use Special Wash: 1/2 bottle night before surgery and 1/2 bottle morning of surgery.   Please read over the following fact sheets that you were given: Pain Booklet, Coughing and Deep Breathing, Blood Transfusion Information, MRSA Information and Surgical Site Infection Prevention

## 2011-05-20 NOTE — Progress Notes (Signed)
Will call DR CRENSHAW S OFFICE FOR CATH,STRESS TEST,NOTES

## 2011-05-28 MED ORDER — CEFAZOLIN SODIUM-DEXTROSE 2-3 GM-% IV SOLR
2.0000 g | INTRAVENOUS | Status: AC
  Administered 2011-05-29: 2 g via INTRAVENOUS
  Filled 2011-05-28: qty 50

## 2011-05-29 ENCOUNTER — Encounter (HOSPITAL_COMMUNITY): Payer: Self-pay | Admitting: *Deleted

## 2011-05-29 ENCOUNTER — Ambulatory Visit (HOSPITAL_COMMUNITY): Payer: PRIVATE HEALTH INSURANCE

## 2011-05-29 ENCOUNTER — Encounter (HOSPITAL_COMMUNITY): Payer: Self-pay

## 2011-05-29 ENCOUNTER — Encounter (HOSPITAL_COMMUNITY)
Admission: RE | Disposition: A | Discharge status: Home / Self Care | Payer: Self-pay | Source: Ambulatory Visit | Attending: Orthopedic Surgery

## 2011-05-29 ENCOUNTER — Other Ambulatory Visit: Payer: Self-pay

## 2011-05-29 ENCOUNTER — Inpatient Hospital Stay (HOSPITAL_COMMUNITY)
Admission: RE | Admit: 2011-05-29 | Discharge: 2011-05-30 | DRG: 483 | Disposition: A | Discharge status: Home / Self Care | Payer: PRIVATE HEALTH INSURANCE | Source: Ambulatory Visit | Attending: Orthopedic Surgery | Admitting: Orthopedic Surgery

## 2011-05-29 DIAGNOSIS — I2510 Atherosclerotic heart disease of native coronary artery without angina pectoris: Secondary | ICD-10-CM | POA: Diagnosis present

## 2011-05-29 DIAGNOSIS — E785 Hyperlipidemia, unspecified: Secondary | ICD-10-CM | POA: Diagnosis present

## 2011-05-29 DIAGNOSIS — M19011 Primary osteoarthritis, right shoulder: Secondary | ICD-10-CM

## 2011-05-29 DIAGNOSIS — J4489 Other specified chronic obstructive pulmonary disease: Secondary | ICD-10-CM | POA: Diagnosis present

## 2011-05-29 DIAGNOSIS — M19019 Primary osteoarthritis, unspecified shoulder: Principal | ICD-10-CM | POA: Diagnosis present

## 2011-05-29 DIAGNOSIS — I428 Other cardiomyopathies: Secondary | ICD-10-CM | POA: Diagnosis present

## 2011-05-29 DIAGNOSIS — I4891 Unspecified atrial fibrillation: Secondary | ICD-10-CM | POA: Diagnosis present

## 2011-05-29 DIAGNOSIS — D509 Iron deficiency anemia, unspecified: Secondary | ICD-10-CM | POA: Diagnosis present

## 2011-05-29 DIAGNOSIS — K7460 Unspecified cirrhosis of liver: Secondary | ICD-10-CM | POA: Diagnosis present

## 2011-05-29 DIAGNOSIS — J449 Chronic obstructive pulmonary disease, unspecified: Secondary | ICD-10-CM | POA: Diagnosis present

## 2011-05-29 HISTORY — PX: TOTAL SHOULDER ARTHROPLASTY: SHX126

## 2011-05-29 LAB — TYPE AND SCREEN
ABO/RH(D): B NEG
Antibody Screen: NEGATIVE

## 2011-05-29 SURGERY — ARTHROPLASTY, SHOULDER, TOTAL
Anesthesia: Regional | Site: Shoulder | Laterality: Right | Wound class: Clean

## 2011-05-29 MED ORDER — DOCUSATE SODIUM 100 MG PO CAPS
100.0000 mg | ORAL_CAPSULE | Freq: Two times a day (BID) | ORAL | Status: DC
  Administered 2011-05-29: 100 mg via ORAL
  Filled 2011-05-29 (×2): qty 1

## 2011-05-29 MED ORDER — ACETAMINOPHEN 650 MG RE SUPP: 650.0000 mg | Freq: Four times a day (QID) | RECTAL | Status: DC | PRN

## 2011-05-29 MED ORDER — METOCLOPRAMIDE HCL 10 MG PO TABS: 5.0000 mg | ORAL_TABLET | Freq: Three times a day (TID) | ORAL | Status: DC | PRN

## 2011-05-29 MED ORDER — ONDANSETRON HCL 4 MG/2ML IJ SOLN: 4.0000 mg | Freq: Four times a day (QID) | INTRAMUSCULAR | Status: DC | PRN

## 2011-05-29 MED ORDER — TEMAZEPAM 15 MG PO CAPS: 15.0000 mg | ORAL_CAPSULE | Freq: Every evening | ORAL | Status: DC | PRN

## 2011-05-29 MED ORDER — DIPHENHYDRAMINE HCL 12.5 MG/5ML PO ELIX
12.5000 mg | ORAL_SOLUTION | ORAL | Status: DC | PRN
  Filled 2011-05-29: qty 10

## 2011-05-29 MED ORDER — SODIUM CHLORIDE 0.9 % IV SOLN
10.0000 mg | INTRAVENOUS | Status: DC | PRN
  Administered 2011-05-29: 100 ug/min via INTRAVENOUS

## 2011-05-29 MED ORDER — FENTANYL CITRATE 0.05 MG/ML IJ SOLN
INTRAMUSCULAR | Status: DC | PRN
  Administered 2011-05-29 (×3): 50 ug via INTRAVENOUS

## 2011-05-29 MED ORDER — CEFAZOLIN SODIUM 1-5 GM-% IV SOLN
INTRAVENOUS | Status: DC | PRN
  Administered 2011-05-29: 1 g via INTRAVENOUS

## 2011-05-29 MED ORDER — LISINOPRIL 20 MG PO TABS
20.0000 mg | ORAL_TABLET | Freq: Two times a day (BID) | ORAL | Status: DC
  Administered 2011-05-29: 20 mg via ORAL
  Filled 2011-05-29 (×4): qty 1

## 2011-05-29 MED ORDER — PHENYLEPHRINE HCL 10 MG/ML IJ SOLN
INTRAMUSCULAR | Status: DC | PRN
  Administered 2011-05-29: 120 ug via INTRAVENOUS
  Administered 2011-05-29: 60 ug via INTRAVENOUS

## 2011-05-29 MED ORDER — MENTHOL 3 MG MT LOZG: 1.0000 | LOZENGE | OROMUCOSAL | Status: DC | PRN

## 2011-05-29 MED ORDER — SIMVASTATIN 20 MG PO TABS
20.0000 mg | ORAL_TABLET | Freq: Every day | ORAL | Status: DC
  Administered 2011-05-29: 20 mg via ORAL
  Filled 2011-05-29 (×2): qty 1

## 2011-05-29 MED ORDER — CHLORHEXIDINE GLUCONATE 4 % EX LIQD: 60.0000 mL | Freq: Once | CUTANEOUS | Status: DC

## 2011-05-29 MED ORDER — PHENOL 1.4 % MT LIQD
1.0000 | OROMUCOSAL | Status: DC | PRN
  Filled 2011-05-29: qty 177

## 2011-05-29 MED ORDER — LACTATED RINGERS IV SOLN: INTRAVENOUS | Status: DC

## 2011-05-29 MED ORDER — CEFAZOLIN SODIUM 1-5 GM-% IV SOLN
1.0000 g | Freq: Four times a day (QID) | INTRAVENOUS | Status: AC
  Administered 2011-05-29 – 2011-05-30 (×3): 1 g via INTRAVENOUS
  Filled 2011-05-29 (×3): qty 50

## 2011-05-29 MED ORDER — METHOCARBAMOL 500 MG PO TABS: 500.0000 mg | ORAL_TABLET | Freq: Four times a day (QID) | ORAL | Status: DC | PRN

## 2011-05-29 MED ORDER — LACTATED RINGERS IV SOLN
INTRAVENOUS | Status: DC | PRN
  Administered 2011-05-29 (×2): via INTRAVENOUS

## 2011-05-29 MED ORDER — ACETAMINOPHEN 325 MG PO TABS: 650.0000 mg | ORAL_TABLET | Freq: Four times a day (QID) | ORAL | Status: DC | PRN

## 2011-05-29 MED ORDER — PROPOFOL 10 MG/ML IV EMUL
INTRAVENOUS | Status: DC | PRN
  Administered 2011-05-29: 150 mg via INTRAVENOUS

## 2011-05-29 MED ORDER — OXYCODONE-ACETAMINOPHEN 5-325 MG PO TABS
1.0000 | ORAL_TABLET | ORAL | Status: DC | PRN
  Administered 2011-05-29 – 2011-05-30 (×4): 2 via ORAL
  Filled 2011-05-29 (×4): qty 2

## 2011-05-29 MED ORDER — BUPIVACAINE-EPINEPHRINE PF 0.5-1:200000 % IJ SOLN
INTRAMUSCULAR | Status: DC | PRN
  Administered 2011-05-29: 30 mL

## 2011-05-29 MED ORDER — KETOROLAC TROMETHAMINE 30 MG/ML IJ SOLN
30.0000 mg | Freq: Four times a day (QID) | INTRAMUSCULAR | Status: DC
  Administered 2011-05-29 – 2011-05-30 (×3): 30 mg via INTRAVENOUS
  Filled 2011-05-29 (×7): qty 1

## 2011-05-29 MED ORDER — SODIUM CHLORIDE 0.9 % IR SOLN
Status: DC | PRN
  Administered 2011-05-29: 1000 mL

## 2011-05-29 MED ORDER — LACTATED RINGERS IV SOLN
INTRAVENOUS | Status: DC
  Administered 2011-05-29: 20:00:00 via INTRAVENOUS

## 2011-05-29 MED ORDER — CEFAZOLIN SODIUM 1-5 GM-% IV SOLN
INTRAVENOUS | Status: AC
  Filled 2011-05-29: qty 50

## 2011-05-29 MED ORDER — HYDROMORPHONE HCL PF 1 MG/ML IJ SOLN: 0.5000 mg | INTRAMUSCULAR | Status: DC | PRN

## 2011-05-29 MED ORDER — ONDANSETRON HCL 4 MG PO TABS: 4.0000 mg | ORAL_TABLET | Freq: Four times a day (QID) | ORAL | Status: DC | PRN

## 2011-05-29 MED ORDER — METHOCARBAMOL 100 MG/ML IJ SOLN
500.0000 mg | Freq: Four times a day (QID) | INTRAVENOUS | Status: DC | PRN
  Filled 2011-05-29: qty 5

## 2011-05-29 MED ORDER — MIDAZOLAM HCL 5 MG/5ML IJ SOLN
INTRAMUSCULAR | Status: DC | PRN
  Administered 2011-05-29: 1 mg via INTRAVENOUS

## 2011-05-29 MED ORDER — CARVEDILOL 12.5 MG PO TABS
12.5000 mg | ORAL_TABLET | Freq: Two times a day (BID) | ORAL | Status: DC
  Administered 2011-05-29: 12.5 mg via ORAL
  Filled 2011-05-29 (×4): qty 1

## 2011-05-29 MED ORDER — VECURONIUM BROMIDE 10 MG IV SOLR
INTRAVENOUS | Status: DC | PRN
  Administered 2011-05-29: 6 mg via INTRAVENOUS

## 2011-05-29 MED ORDER — METOCLOPRAMIDE HCL 5 MG/ML IJ SOLN
5.0000 mg | Freq: Three times a day (TID) | INTRAMUSCULAR | Status: DC | PRN
  Filled 2011-05-29: qty 2

## 2011-05-29 MED ORDER — ONDANSETRON HCL 4 MG/2ML IJ SOLN
INTRAMUSCULAR | Status: DC | PRN
  Administered 2011-05-29: 4 mg via INTRAVENOUS

## 2011-05-29 SURGICAL SUPPLY — 54 items
BLADE SAW SGTL 83.5X18.5 (BLADE) ×2 IMPLANT
CEMENT BONE DEPUY (Cement) ×1 IMPLANT
CLOTH BEACON ORANGE TIMEOUT ST (SAFETY) ×2 IMPLANT
COVER SURGICAL LIGHT HANDLE (MISCELLANEOUS) ×2 IMPLANT
DRAPE INCISE IOBAN 66X45 STRL (DRAPES) ×2 IMPLANT
DRAPE SURG 17X11 SM STRL (DRAPES) ×2 IMPLANT
DRAPE SURG 17X23 STRL (DRAPES) ×2 IMPLANT
DRAPE U-SHAPE 47X51 STRL (DRAPES) ×2 IMPLANT
DRILL BIT 7/64X5 (BIT) IMPLANT
DRSG MEPILEX BORDER 4X8 (GAUZE/BANDAGES/DRESSINGS) ×2 IMPLANT
DURAPREP 26ML APPLICATOR (WOUND CARE) ×2 IMPLANT
ELECT CAUTERY BLADE 6.4 (BLADE) ×2 IMPLANT
ELECT REM PT RETURN 9FT ADLT (ELECTROSURGICAL) ×2
ELECTRODE REM PT RTRN 9FT ADLT (ELECTROSURGICAL) ×1 IMPLANT
FACESHIELD LNG OPTICON STERILE (SAFETY) ×6 IMPLANT
GLOVE BIO SURGEON STRL SZ7.5 (GLOVE) ×2 IMPLANT
GLOVE BIO SURGEON STRL SZ8 (GLOVE) ×2 IMPLANT
GLOVE EUDERMIC 7 POWDERFREE (GLOVE) ×2 IMPLANT
GLOVE SS BIOGEL STRL SZ 7.5 (GLOVE) ×1 IMPLANT
GLOVE SUPERSENSE BIOGEL SZ 7.5 (GLOVE) ×1
GOWN PREVENTION PLUS XLARGE (GOWN DISPOSABLE) ×2 IMPLANT
GOWN STRL NON-REIN LRG LVL3 (GOWN DISPOSABLE) ×3 IMPLANT
GOWN STRL REIN XL XLG (GOWN DISPOSABLE) ×2 IMPLANT
KIT BASIN OR (CUSTOM PROCEDURE TRAY) ×2 IMPLANT
KIT ROOM TURNOVER OR (KITS) ×2 IMPLANT
MANIFOLD NEPTUNE II (INSTRUMENTS) ×2 IMPLANT
NDL HYPO 25GX1X1/2 BEV (NEEDLE) IMPLANT
NDL SUT 6 .5 CRC .975X.05 MAYO (NEEDLE) ×1 IMPLANT
NEEDLE HYPO 25GX1X1/2 BEV (NEEDLE) ×2 IMPLANT
NEEDLE MAYO TAPER (NEEDLE) ×2
NS IRRIG 1000ML POUR BTL (IV SOLUTION) ×2 IMPLANT
PACK SHOULDER (CUSTOM PROCEDURE TRAY) ×2 IMPLANT
PAD ARMBOARD 7.5X6 YLW CONV (MISCELLANEOUS) ×2 IMPLANT
PASSER SUT SWANSON 36MM LOOP (INSTRUMENTS) ×1 IMPLANT
SLING ARM IMMOBILIZER LRG (SOFTGOODS) ×2 IMPLANT
SMARTMIX MINI TOWER (MISCELLANEOUS) ×2
SPONGE LAP 18X18 X RAY DECT (DISPOSABLE) ×2 IMPLANT
SPONGE LAP 4X18 X RAY DECT (DISPOSABLE) ×1 IMPLANT
STRIP CLOSURE SKIN 1/2X4 (GAUZE/BANDAGES/DRESSINGS) ×2 IMPLANT
SUCTION FRAZIER TIP 10 FR DISP (SUCTIONS) ×2 IMPLANT
SUT BONE WAX W31G (SUTURE) ×1 IMPLANT
SUT FIBERWIRE #2 38 T-5 BLUE (SUTURE) ×6
SUT MNCRL AB 3-0 PS2 18 (SUTURE) ×2 IMPLANT
SUT VIC AB 1 CT1 27 (SUTURE) ×6
SUT VIC AB 1 CT1 27XBRD ANBCTR (SUTURE) ×3 IMPLANT
SUT VIC AB 2-0 CT1 27 (SUTURE) ×4
SUT VIC AB 2-0 CT1 TAPERPNT 27 (SUTURE) ×2 IMPLANT
SUTURE FIBERWR #2 38 T-5 BLUE (SUTURE) ×2 IMPLANT
SYR 30ML SLIP (SYRINGE) ×2 IMPLANT
SYR CONTROL 10ML LL (SYRINGE) ×2 IMPLANT
TOWEL OR 17X24 6PK STRL BLUE (TOWEL DISPOSABLE) ×2 IMPLANT
TOWEL OR 17X26 10 PK STRL BLUE (TOWEL DISPOSABLE) ×2 IMPLANT
TOWER SMARTMIX MINI (MISCELLANEOUS) ×1 IMPLANT
WATER STERILE IRR 1000ML POUR (IV SOLUTION) ×2 IMPLANT

## 2011-05-29 NOTE — Anesthesia Postprocedure Evaluation (Signed)
Anesthesia Post Note  Patient: [name]  Procedure(s) Performed:  TOTAL SHOULDER ARTHROPLASTY  Anesthesia type: General  Patient location: PACU  Post pain: Pain level controlled  Post assessment: Patient's Cardiovascular Status Stable  Last Vitals:  Filed Vitals:   05/29/11 1230  BP: 118/73  Pulse: 85  Temp:   Resp: 21    Post vital signs: Reviewed and stable  Level of consciousness: sedated  Complications: No apparent anesthesia complications

## 2011-05-29 NOTE — Transfer of Care (Signed)
Immediate Anesthesia Transfer of Care Note  Patient: [name]  Procedure(s) Performed:  TOTAL SHOULDER ARTHROPLASTY  Patient Location: PACU  Anesthesia Type: General  Level of Consciousness: awake, alert  and oriented  Airway & Oxygen Therapy: Patient Spontanous Breathing and Patient connected to face mask oxygen  Post-op Assessment: Report given to PACU RN, Post -op Vital signs reviewed and stable, Patient moving all extremities and Patient moving all extremities X 4  Post vital signs: Reviewed and stable  Complications: No apparent anesthesia complications

## 2011-05-29 NOTE — Anesthesia Procedure Notes (Addendum)
Anesthesia Regional Block:  Interscalene brachial plexus block  Pre-Anesthetic Checklist: ,, timeout performed, Correct Patient, Correct Site, Correct Laterality, Correct Procedure,, site marked, risks and benefits discussed, Surgical consent,  Pre-op evaluation,  At surgeon's request and post-op pain management   Prep: chloraprep       Needles:  Injection technique: Single-shot  Needle Type: Echogenic Stimulator Needle     Needle Length: 5cm 5 cm Needle Gauge: 22 and 22 G    Additional Needles:  Procedures: ultrasound guided and nerve stimulator Interscalene brachial plexus block  Nerve Stimulator or Paresthesia:  Response: bicep contraction, 0.45 mA,   Additional Responses:   Narrative:  Start time: 05/29/2011 7:04 AM End time: 05/29/2011 7:14 AM Injection made incrementally with aspirations every 5 mL.  Performed by: Personally  Anesthesiologist: J. Adonis Huguenin, MD  Additional Notes: Functioning IV was confirmed and monitors applied.  A 50mm 22ga echogenic arrow stimulator was used. Sterile prep and drape,hand hygiene and sterile gloves were used.Ultrasound guidance: relevent anatomy identified, needle position confirmed, local anesthetic spread visualized around nerve(s)., vascular puncture avoided.  Image printed for medical record.  Negative aspiration and negative test dose prior to incremental administration of local anesthetic. The patient tolerated the procedure well.  Interscalene brachial plexus block Procedure Name: Intubation Date/Time: 05/29/2011 7:49 AM Performed by: Carmela Rima Pre-anesthesia Checklist: Emergency Drugs available, Patient identified, Timeout performed, Suction available and Patient being monitored Patient Re-evaluated:Patient Re-evaluated prior to inductionOxygen Delivery Method: Circle System Utilized Preoxygenation: Pre-oxygenation with 100% oxygen Intubation Type: IV induction Ventilation: Mask ventilation without  difficulty Laryngoscope Size: Mac and 3 Grade View: Grade I Tube type: Oral Tube size: 7.5 mm Number of attempts: 1 Placement Confirmation: ETT inserted through vocal cords under direct vision,  positive ETCO2,  CO2 detector and breath sounds checked- equal and bilateral Secured at: 22 cm Tube secured with: Tape Dental Injury: Teeth and Oropharynx as per pre-operative assessment

## 2011-05-29 NOTE — Op Note (Signed)
NAMEARNOL, MCGIBBON                ACCOUNT NO.:  [id number]  MEDICAL RECORD NO.:  [id number]  LOCATION:  [facility]                         FACILITY:  [facility]  PHYSICIAN:  Vania Rea. Lary Eckardt, M.D.  DATE OF BIRTH:  May 10, 1946  DATE OF PROCEDURE:  05/29/2011 DATE OF DISCHARGE:                              OPERATIVE REPORT   PREOPERATIVE DIAGNOSIS:  End-stage right shoulder osteoarthrosis.  POSTOPERATIVE DIAGNOSIS:  End-stage right shoulder osteoarthrosis.  PROCEDURE:  Right total shoulder arthroplasty utilizing a press-fit size 14 DePuy global stem with a 48 x 21 humeral head, and a cemented pegged 44 glenoid.  SURGEON:  Vania Rea. Jeffey Janssen, MD  ASSISTANT:  Lucita Lora. Shuford, PA-C  ANESTHESIA:  General endotracheal as well as interscalene block.  BLOOD LOSS:  250 mL.  DRAINS:  None.  HISTORY:  Ms. Noboa is a 65 year old gentleman who has had chronic progressive increasing right shoulder pain with restrictions in mobility and functional limitations refractory to prolonged attempts at conservative management.  His plain radiographs show advanced arthrosis with severe bony deformity and large peripheral osteophytes with significant glenoid retroversion.  He was brought to the operating room at this time for a planned right total shoulder arthroplasty.  Preoperatively counseled Mr. Watford on treatment options as well as risks versus benefits thereof.  Possible surgical complications were reviewed including potential for bleeding, infection, neurovascular injury, persistent pain, loss of motion, anesthetic complication, failure of the implant, and possible need for additional surgery.  He understands and accepts and agrees with our planned procedure.  PROCEDURE IN DETAIL:  After undergoing routine preop evaluation, the patient received prophylactic antibiotics.  An interscalene block was established in the holding area by the Anesthesia Department.  Placed supine on the op table and  underwent smooth induction of a general endotracheal anesthesia.  Placed in a beach-chair position and appropriately padded and protected.  Right shoulder girdle region was then sterilely prepped and draped in standard fashion.  Time-out was called.  His preop motion was markedly restricted with only 10 degrees of internal and 20 degrees of external rotation.  We made an anterior deltopectoral approach through a 15 cm incision along the deltopectoral interval.  Sharp dissection was carried down through the skin and subcu and electrocautery was used for hemostasis.  We then identified the cephalic vein, developed the deltopectoral interval with the vein retracted laterally with the deltoid, and the upper centimeter of the pec major was tenotomized, and self-retaining retractors were placed. We released the adhesions throughout the subacromial/subdeltoid bursal region.  Biceps tendon was then unroofed and tenotomized at the level of the bicipital groove for later to complete a tenodesis.  We then tenotomized horizontally along the rotator interval to the base of the coracoid and then the subscapularis was divided from the lesser tuberosity leaving a lateral cuff approximately 2 cm for later repair. The free margin of the subscapularis was then tagged with a series of 3 grasping #2 FiberWire sutures.  We then released the capsule from the inferior aspect of the humeral head, and allowing delivery of the humeral head through the wound taking care to protect the rotator cuff superiorly and posteriorly.  Very large osteophyte on  the inferior aspect of the humeral head was removed with an osteotome anteriorly, inferiorly, and posteriorly.  At this point, we then outlined our proposed humeral head resection, maintaining approximately 30 degrees of retroversion.  This was completed with an oscillating saw, taking care to protect the rotator cuff.  We then used hand reaming up to size 14. Having  confirmed appropriate version, used the cookie cutter broach to outline our proposed broached orientation, then broached up to size 14 with excellent fit.  Metal cap was then placed over the cut surface of the proximal humerus.  We then exposed the glenoid with a Fukuda posteriorly, and then the sequential superior, anterior, and inferior exposure with resection of the labrum adjacent to capsular attachments allowing circumferential exposure of the labrum, which did have moderate retroversion.  Once the overall glenoid was exposed to our satisfaction, we placed a guidepin into the center of the glenoid, then performed a reaming with a size 44 performing some correction of the retroversion bringing back into a more normal alignment.  Once this was completed, we then drilled our central drill hole and then using the proper guide, we drilled the peripheral PEG holes, irrigated the glenoid and then performed a trial with a 44 glen, which showed excellent fit and coverage of the glenoid surface.  At this point, then we mixed the cement in the appropriate consistency, introduced the cement into the peripheral PEG holes.  The final glenoid was then impacted into position with excellent fit and fixation and overall much to our satisfaction. We then returned our attention to the cut surface of the proximal humerus.  We then re-broached, confirmed proper fit, and then ultimately the size 14 stem was impacted into position after we did bone graft proximally in the metaphyseal region obtaining excellent fit and purchase.  We then performed trial reductions and ultimately the 48 x 21 head showed the best coverage and soft tissue balance with easily 50% translation of the humeral head across the glenoid.  We then meticulously cleaned and dried the Morse taper and impacted  the final 48 x 21 humeral head, performed final reduction, again soft tissue balance and position was all much to our satisfaction.   We then repaired the subscapularis back to the lesser tuberosity with the #2 FiberWires, then closed the rotator interval with 2 figure-of-eight FiberWire sutures allowing excellent soft tissue reapposition.  We then performed a tenodesis of the biceps at the midpoint of the bicipital groove using the FiberWire as well.  At this time, the overall soft tissue appeared much to our satisfaction.  The wound was irrigated and hemostasis was obtained.  The deltopectoral interval was then reapproximated with #1 Vicryl, 2-0 Vicryl used for the subcu layer and intracuticular 3-0 Monocryl for the skin followed by Steri-Strips.  Dry dressing taped about the right shoulder and right arm was placed in a sling.  The patient was then awakened, extubated, and taken to recovery room in stable condition.     Vania Rea. Marijayne Rauth, M.D.     KMS/MEDQ  D:  05/29/2011  T:  05/29/2011  Job:  [id number]

## 2011-05-29 NOTE — H&P (Signed)
[name]    Chief Complaint: R shoulder OA HPI: The patient is a 65 y.o. male with R shoulder OA  Past Medical History  Diagnosis Date  . Hyperlipidemia   . Anemia     IDA  . COPD (chronic obstructive pulmonary disease)   . Cirrhosis   . Substance abuse     alcohol  . Gastric ulcer 2007    Diagnosed by Dr. Elnoria Howard 2007 by EGD  . ED (erectile dysfunction)   . CAD (coronary artery disease)   . Cardiomyopathy     alcoholic CM, EF 25-30%, per cath 2007  . Complication of anesthesia     pt stated he quit breathing  . A-fib     not a candidate for anticoagualted due to alcohol abuse and gastric ulcer  . Hypertension     ,dr [name],  echo in epic    Past Surgical History  Procedure Date  . Tonsillectomy   . Joint replacement     rt knee,rt shoulder  . Cardiac catheterization     History reviewed. No pertinent family history.  Social History:  reports that he quit smoking about 13 months ago. His smoking use included Cigarettes. He does not have any smokeless tobacco history on file. He reports that he does not drink alcohol or use illicit drugs.  Allergies: No Known Allergies  Medications Prior to Admission  Medication Dose Route Frequency Provider Last Rate Last Dose  . ceFAZolin (ANCEF) IVPB 2 g/50 mL premix  2 g Intravenous 60 min Pre-Op Ralene Bathe, PA      . chlorhexidine (HIBICLENS) 4 % liquid 4 application  60 mL Topical Once [employer], PA      . lactated ringers infusion   Intravenous Continuous Ralene Bathe, PA       Medications Prior to Admission  Medication Sig Dispense Refill  . acetaminophen (TYLENOL) 500 MG tablet Take 500 mg by mouth every 4 (four) hours as needed. For pain      . atorvastatin (LIPITOR) 40 MG tablet Take 40 mg by mouth every morning.       Marland Kitchen aspirin 325 MG EC tablet Take 325 mg by mouth every morning.          ROS: per hpi  Physical Exam: R shoulder as per office notes  Vitals  Temp:  [98.2 F (36.8 C)] 98.2 F (36.8  C) (11/29 9604) Pulse Rate:  [83] 83  (11/29 0613) Resp:  [20] 20  (11/29 0613) BP: (130)/(85) 130/85 mmHg (11/29 0613) SpO2:  [96 %] 96 % (11/29 5409)  Assessment/Plan  Impression: R shoulder OA  Plan of Action: R TSA  Corita Allinson M 05/29/2011, 7:37 AM

## 2011-05-29 NOTE — Anesthesia Preprocedure Evaluation (Addendum)
Anesthesia Evaluation  Patient identified by MRN, date of birth, ID band Patient awake and Patient confused    Reviewed: Allergy & Precautions, H&P , NPO status , Patient's Chart, lab work & pertinent test results, reviewed documented beta blocker date and time   History of Anesthesia Complications (+) PROLONGED EMERGENCE  Airway Mallampati: I TM Distance: >3 FB Neck ROM: full    Dental  (+) Edentulous Upper and Edentulous Lower   Pulmonary COPD clear to auscultation        Cardiovascular hypertension, On Home Beta Blockers + CAD, +CHF and + DOE Atrial Fibrillation regular Normal- Systolic murmurs    Neuro/Psych PSYCHIATRIC DISORDERS    GI/Hepatic PUD, (+) Cirrhosis -       ,   Endo/Other    Renal/GU      Musculoskeletal   Abdominal   Peds  Hematology   Anesthesia Other Findings   Reproductive/Obstetrics                          Anesthesia Physical Anesthesia Plan  ASA: III  Anesthesia Plan: General   Post-op Pain Management:    Induction:   Airway Management Planned: Oral ETT  Additional Equipment:   Intra-op Plan:   Post-operative Plan: Extubation in OR  Informed Consent: I have reviewed the patients History and Physical, chart, labs and discussed the procedure including the risks, benefits and alternatives for the proposed anesthesia with the patient or authorized representative who has indicated his/her understanding and acceptance.   Dental Advisory Given  Plan Discussed with: CRNA and Surgeon  Anesthesia Plan Comments:        Anesthesia Quick Evaluation

## 2011-05-29 NOTE — Brief Op Note (Signed)
05/29/2011  11:08 AM  PATIENT:   [name]  65 y.o. male  PRE-OPERATIVE DIAGNOSIS:  OA Right Shoulder   POST-OPERATIVE DIAGNOSIS:  Same  PROCEDURE:  Right TSA 14 stem press fit, 44 cemented glenoid 48x21 head  SURGEON:  Senaida Lange M.D.  ASSISTANTS: Tracy Shuford PAC   ANESTHESIA:   GET +ISB  EBL: 250  SPECIMEN:  none  Drains: none   PATIENT DISPOSITION:  PACU - hemodynamically stable.    PLAN OF CARE: Admit to inpatient   Dictation# [id number]

## 2011-05-30 ENCOUNTER — Encounter (HOSPITAL_COMMUNITY): Payer: Self-pay | Admitting: Orthopedic Surgery

## 2011-05-30 MED ORDER — METHOCARBAMOL 500 MG PO TABS: 500.0000 mg | ORAL_TABLET | Freq: Three times a day (TID) | ORAL | Status: AC | PRN

## 2011-05-30 MED ORDER — OXYCODONE-ACETAMINOPHEN 5-325 MG PO TABS: 1.0000 | ORAL_TABLET | ORAL | Status: AC | PRN

## 2011-05-30 NOTE — Progress Notes (Signed)
Pt discharged home with wife.  Both verbalized understanding of instructions.  Signed copy of AVS and prescriptions sent home with patient.  Dressing to rt shoulder changed prior to discharge.

## 2011-05-30 NOTE — Progress Notes (Signed)
Occupational Therapy Shoulder Evaluation Evaluation completed and filed in ghost chart.  Pt and wife educated and able to return demonstration on the following:ADL education, sling education, pendulums, AROM/PROM. Pt presents with a medical diagnosis of R shoulder arthroplasty. Pt presents with no further acute OT needs secondary to: All education completed. Pt will have necessary level of assist by caregiver on D/C.  Will not follow pt acutely. No DME needs. Progress rehab of shoulder as ordered by MD post follow-up appointment. 05/30/2011 Cipriano Mile OTR/L Pager [phone] Office [phone]

## 2011-05-30 NOTE — Discharge Summary (Signed)
Physician Discharge Summary  Patient ID: [name] MRN: [id number] DOB/AGE: 1945/07/22 65 y.o.  Admit date: 05/29/2011 Discharge date: 05/30/2011  Admission Diagnoses: Right Shoulder Arthritis  Discharge Diagnoses: same  Discharged Condition: stable  Hospital Course: Uncomplicated. Pt underwent right Total Shoulder arthroplasty and received all peri operative medications. He did extremely well and was orthopaedically and medically stable for discharge the following am.  Consults: none  Operative Procedure: Right Total Shoulder Arthroplasty  Disposition: Home or Self Care   Current Discharge Medication List    CONTINUE these medications which have NOT CHANGED   Details  acetaminophen (TYLENOL) 500 MG tablet Take 500 mg by mouth every 4 (four) hours as needed. For pain    atorvastatin (LIPITOR) 40 MG tablet Take 40 mg by mouth every morning.     carvedilol (COREG) 12.5 MG tablet Take 12.5 mg by mouth 2 (two) times daily.      lisinopril (PRINIVIL,ZESTRIL) 20 MG tablet Take 20 mg by mouth 2 (two) times daily.      aspirin 325 MG EC tablet Take 325 mg by mouth every morning.         Follow-up Information    Call SUPPLE,KEVIN M. (to make appt to be seen in 2 weeks)    Contact information:   [facility] [address], [address] [place] [address] [phone]          Discharge Instructions: see attached discharge instructions Follow up in 2 weeks.  SignedRalene Bathe 05/30/2011, 7:38 AM

## 2011-06-06 ENCOUNTER — Other Ambulatory Visit: Payer: Self-pay | Admitting: Cardiology

## 2011-06-17 ENCOUNTER — Ambulatory Visit: Payer: PRIVATE HEALTH INSURANCE | Attending: Orthopedic Surgery

## 2011-06-17 DIAGNOSIS — M25619 Stiffness of unspecified shoulder, not elsewhere classified: Secondary | ICD-10-CM | POA: Insufficient documentation

## 2011-06-17 DIAGNOSIS — M6281 Muscle weakness (generalized): Secondary | ICD-10-CM | POA: Insufficient documentation

## 2011-06-17 DIAGNOSIS — R5381 Other malaise: Secondary | ICD-10-CM | POA: Insufficient documentation

## 2011-06-17 DIAGNOSIS — M25519 Pain in unspecified shoulder: Secondary | ICD-10-CM | POA: Insufficient documentation

## 2011-06-17 DIAGNOSIS — IMO0001 Reserved for inherently not codable concepts without codable children: Secondary | ICD-10-CM | POA: Insufficient documentation

## 2011-06-19 ENCOUNTER — Ambulatory Visit: Payer: PRIVATE HEALTH INSURANCE

## 2011-06-25 ENCOUNTER — Ambulatory Visit: Payer: PRIVATE HEALTH INSURANCE

## 2011-06-26 ENCOUNTER — Ambulatory Visit: Payer: PRIVATE HEALTH INSURANCE | Admitting: Physical Therapy

## 2011-06-30 ENCOUNTER — Ambulatory Visit: Payer: PRIVATE HEALTH INSURANCE

## 2011-07-02 ENCOUNTER — Ambulatory Visit: Payer: PRIVATE HEALTH INSURANCE | Attending: Orthopedic Surgery

## 2011-07-02 DIAGNOSIS — IMO0001 Reserved for inherently not codable concepts without codable children: Secondary | ICD-10-CM | POA: Insufficient documentation

## 2011-07-02 DIAGNOSIS — M6281 Muscle weakness (generalized): Secondary | ICD-10-CM | POA: Insufficient documentation

## 2011-07-02 DIAGNOSIS — M25519 Pain in unspecified shoulder: Secondary | ICD-10-CM | POA: Insufficient documentation

## 2011-07-02 DIAGNOSIS — M25619 Stiffness of unspecified shoulder, not elsewhere classified: Secondary | ICD-10-CM | POA: Insufficient documentation

## 2011-07-02 DIAGNOSIS — R5381 Other malaise: Secondary | ICD-10-CM | POA: Insufficient documentation

## 2011-07-03 ENCOUNTER — Other Ambulatory Visit: Payer: Self-pay | Admitting: *Deleted

## 2011-07-03 MED ORDER — ATORVASTATIN CALCIUM 40 MG PO TABS: 40.0000 mg | ORAL_TABLET | ORAL | Status: DC

## 2011-07-07 ENCOUNTER — Ambulatory Visit: Payer: PRIVATE HEALTH INSURANCE

## 2011-07-10 ENCOUNTER — Ambulatory Visit: Payer: PRIVATE HEALTH INSURANCE | Admitting: Physical Therapy

## 2011-07-14 ENCOUNTER — Ambulatory Visit: Payer: PRIVATE HEALTH INSURANCE

## 2011-07-16 ENCOUNTER — Ambulatory Visit: Payer: PRIVATE HEALTH INSURANCE

## 2011-07-21 ENCOUNTER — Ambulatory Visit: Payer: PRIVATE HEALTH INSURANCE

## 2011-07-23 ENCOUNTER — Ambulatory Visit: Payer: PRIVATE HEALTH INSURANCE

## 2011-07-28 ENCOUNTER — Ambulatory Visit: Payer: PRIVATE HEALTH INSURANCE

## 2011-07-28 ENCOUNTER — Encounter (HOSPITAL_COMMUNITY): Payer: Self-pay

## 2011-07-30 ENCOUNTER — Ambulatory Visit: Payer: PRIVATE HEALTH INSURANCE

## 2011-08-04 ENCOUNTER — Ambulatory Visit: Payer: PRIVATE HEALTH INSURANCE | Attending: Orthopedic Surgery | Admitting: Physical Therapy

## 2011-08-04 DIAGNOSIS — IMO0001 Reserved for inherently not codable concepts without codable children: Secondary | ICD-10-CM | POA: Insufficient documentation

## 2011-08-04 DIAGNOSIS — M25619 Stiffness of unspecified shoulder, not elsewhere classified: Secondary | ICD-10-CM | POA: Insufficient documentation

## 2011-08-04 DIAGNOSIS — M25519 Pain in unspecified shoulder: Secondary | ICD-10-CM | POA: Insufficient documentation

## 2011-08-04 DIAGNOSIS — M6281 Muscle weakness (generalized): Secondary | ICD-10-CM | POA: Insufficient documentation

## 2011-08-04 DIAGNOSIS — R5381 Other malaise: Secondary | ICD-10-CM | POA: Insufficient documentation

## 2011-08-07 ENCOUNTER — Ambulatory Visit: Payer: PRIVATE HEALTH INSURANCE

## 2011-08-12 ENCOUNTER — Ambulatory Visit: Payer: PRIVATE HEALTH INSURANCE | Admitting: Physical Therapy

## 2011-08-14 ENCOUNTER — Ambulatory Visit: Payer: PRIVATE HEALTH INSURANCE | Admitting: Physical Therapy

## 2011-08-18 ENCOUNTER — Ambulatory Visit: Payer: PRIVATE HEALTH INSURANCE | Admitting: Physical Therapy

## 2011-08-18 ENCOUNTER — Ambulatory Visit: Payer: PRIVATE HEALTH INSURANCE

## 2011-08-20 ENCOUNTER — Ambulatory Visit: Payer: PRIVATE HEALTH INSURANCE

## 2011-08-25 ENCOUNTER — Ambulatory Visit: Payer: PRIVATE HEALTH INSURANCE

## 2011-08-27 ENCOUNTER — Ambulatory Visit: Payer: PRIVATE HEALTH INSURANCE

## 2011-09-01 ENCOUNTER — Ambulatory Visit: Payer: PRIVATE HEALTH INSURANCE | Attending: Orthopedic Surgery

## 2011-09-01 DIAGNOSIS — M25619 Stiffness of unspecified shoulder, not elsewhere classified: Secondary | ICD-10-CM | POA: Insufficient documentation

## 2011-09-01 DIAGNOSIS — R5381 Other malaise: Secondary | ICD-10-CM | POA: Insufficient documentation

## 2011-09-01 DIAGNOSIS — M25519 Pain in unspecified shoulder: Secondary | ICD-10-CM | POA: Insufficient documentation

## 2011-09-01 DIAGNOSIS — IMO0001 Reserved for inherently not codable concepts without codable children: Secondary | ICD-10-CM | POA: Insufficient documentation

## 2011-09-01 DIAGNOSIS — M6281 Muscle weakness (generalized): Secondary | ICD-10-CM | POA: Insufficient documentation

## 2011-09-02 ENCOUNTER — Encounter: Payer: Self-pay | Admitting: Physical Therapy

## 2011-09-03 ENCOUNTER — Ambulatory Visit: Payer: PRIVATE HEALTH INSURANCE | Admitting: Physical Therapy

## 2011-09-08 ENCOUNTER — Ambulatory Visit (INDEPENDENT_AMBULATORY_CARE_PROVIDER_SITE_OTHER): Payer: Worker's Compensation | Admitting: Cardiology

## 2011-09-08 ENCOUNTER — Encounter: Payer: Self-pay | Admitting: Cardiology

## 2011-09-08 DIAGNOSIS — I350 Nonrheumatic aortic (valve) stenosis: Secondary | ICD-10-CM | POA: Insufficient documentation

## 2011-09-08 DIAGNOSIS — E785 Hyperlipidemia, unspecified: Secondary | ICD-10-CM

## 2011-09-08 DIAGNOSIS — E7800 Pure hypercholesterolemia, unspecified: Secondary | ICD-10-CM

## 2011-09-08 DIAGNOSIS — I428 Other cardiomyopathies: Secondary | ICD-10-CM

## 2011-09-08 DIAGNOSIS — I4891 Unspecified atrial fibrillation: Secondary | ICD-10-CM

## 2011-09-08 DIAGNOSIS — I2510 Atherosclerotic heart disease of native coronary artery without angina pectoris: Secondary | ICD-10-CM

## 2011-09-08 DIAGNOSIS — I359 Nonrheumatic aortic valve disorder, unspecified: Secondary | ICD-10-CM

## 2011-09-08 HISTORY — DX: Nonrheumatic aortic (valve) stenosis: I35.0

## 2011-09-08 NOTE — Progress Notes (Signed)
HPI: [name] is a very pleasant gentleman with a history of nonischemic cardiomyopathy improved by most recent echocardiogram.  He also has a history of coronary artery disease with cardiomyopathy out of proportion; alcohol use, now resolved; and paroxysmal atrial fibrillation holding sinus rhythm.  Cardiac catheterization in October of 2007 showed a 20% left main, a 60% mid LAD and a 60-70% distal LAD. There was a 30-40% first diagonal. There was a 30% circumflex and a 40% OM. There was a 30-40% right coronary artery. Ejection fraction was 25-30%.  Last echocardiogram in Feb 2012 showed an ejection fraction of 60-65%. There was mild aortic stenosis with a mean gradient of 12 mm of mercury. There was mild left atrial enlargement. Abdominal ultrasound in February 2012 showed no aneurysm. I last saw him in Feb 2012. Since then the patient has dyspnea with more extreme activities but not with routine activities. It is relieved with rest. It is not associated with chest pain. There is no orthopnea, PND or pedal edema. There is no syncope or palpitations. There is no exertional chest pain.   Current Outpatient Prescriptions  Medication Sig Dispense Refill  . aspirin 325 MG EC tablet Take 325 mg by mouth every morning.        Marland Kitchen atorvastatin (LIPITOR) 40 MG tablet Take 1 tablet (40 mg total) by mouth every morning.  90 tablet  12  . carvedilol (COREG) 12.5 MG tablet Take 12.5 mg by mouth 2 (two) times daily.        Marland Kitchen lisinopril (PRINIVIL,ZESTRIL) 20 MG tablet Take 20 mg by mouth 2 (two) times daily.           Past Medical History  Diagnosis Date  . Hyperlipidemia   . Anemia     IDA  . COPD (chronic obstructive pulmonary disease)   . Cirrhosis   . Substance abuse     alcohol  . Gastric ulcer 2007    Diagnosed by Dr. Elnoria Frazier 2007 by EGD  . ED (erectile dysfunction)   . CAD (coronary artery disease)   . Cardiomyopathy     alcoholic CM, EF 25-30%, per cath 2007  . Complication of anesthesia    pt stated he quit breathing  . A-fib     not a candidate for anticoagualted due to alcohol abuse and gastric ulcer  . Hypertension     ,dr [name],  echo in epic    Past Surgical History  Procedure Date  . Tonsillectomy   . Joint replacement     rt knee,rt shoulder  . Cardiac catheterization   . Total shoulder arthroplasty 05/29/2011    Procedure: TOTAL SHOULDER ARTHROPLASTY;  Surgeon: [name];  Location: [facility] OR;  Service: Orthopedics;  Laterality: Right;    History   Social History  . Marital Status: Married    Spouse Name: N/A    Number of Children: N/A  . Years of Education: N/A   Occupational History  . Not on file.   Social History Main Topics  . Smoking status: Former Smoker    Types: Cigarettes    Quit date: 04/18/2010  . Smokeless tobacco: Not on file  . Alcohol Use: No     qiuit drinking 07  . Drug Use: No  . Sexually Active:    Other Topics Concern  . Not on file   Social History Narrative   Prior ETOH abuse now resolvedTobacco use- yes    ROS: Some residual pain right shoulder from surgery but no  fevers or chills, productive cough, hemoptysis, dysphasia, odynophagia, melena, hematochezia, dysuria, hematuria, rash, seizure activity, orthopnea, PND, pedal edema, claudication. Remaining systems are negative.  Physical Exam: Well-developed well-nourished in no acute distress.  Skin is warm and dry.  HEENT is normal.  Neck is Frazier. No thyromegaly.  Chest is clear to auscultation with normal expansion. S/p left shoulder replacement Cardiovascular exam is regular rate and rhythm. 2/6 systolic murmur Abdominal exam nontender or distended. No masses palpated. Extremities show no edema. neuro grossly intact  ECG normal sinus rhythm at a rate of 81. First degree AV block. No ST changes.

## 2011-09-08 NOTE — Assessment & Plan Note (Signed)
Continue statin. Check lipids and liver. 

## 2011-09-08 NOTE — Assessment & Plan Note (Signed)
Improved on most recent echocardiogram. Continue present medications. Check potassium and renal function.

## 2011-09-08 NOTE — Patient Instructions (Signed)
Your physician wants you to follow-up in: ONE YEAR You will receive a reminder letter in the mail two months in advance. If you don't receive a letter, please call our office to schedule the follow-up appointment.   Your physician has requested that you have an echocardiogram. Echocardiography is a painless test that uses sound waves to create images of your heart. It provides your doctor with information about the size and shape of your heart and how well your heart's chambers and valves are working. This procedure takes approximately one hour. There are no restrictions for this procedure.   Your physician recommends that you return for lab work in: WHEN FASTING

## 2011-09-08 NOTE — Assessment & Plan Note (Signed)
History of atrial fibrillation in the setting of cardiomyopathy but no recurrences since his LV function has improved. Continue aspirin and beta blocker.   

## 2011-09-08 NOTE — Assessment & Plan Note (Signed)
Continue aspirin and statin. 

## 2011-09-08 NOTE — Assessment & Plan Note (Signed)
Arrange followup echocardiogram.

## 2011-09-09 ENCOUNTER — Encounter: Payer: Self-pay | Admitting: Physical Therapy

## 2011-09-16 ENCOUNTER — Ambulatory Visit: Payer: PRIVATE HEALTH INSURANCE

## 2011-09-17 ENCOUNTER — Ambulatory Visit: Payer: PRIVATE HEALTH INSURANCE | Admitting: Rehabilitative and Restorative Service Providers"

## 2011-09-18 ENCOUNTER — Other Ambulatory Visit (INDEPENDENT_AMBULATORY_CARE_PROVIDER_SITE_OTHER): Payer: 59

## 2011-09-18 ENCOUNTER — Ambulatory Visit (HOSPITAL_COMMUNITY): Payer: 59 | Attending: Cardiovascular Disease

## 2011-09-18 DIAGNOSIS — I4891 Unspecified atrial fibrillation: Secondary | ICD-10-CM

## 2011-09-18 DIAGNOSIS — J4489 Other specified chronic obstructive pulmonary disease: Secondary | ICD-10-CM | POA: Insufficient documentation

## 2011-09-18 DIAGNOSIS — I359 Nonrheumatic aortic valve disorder, unspecified: Secondary | ICD-10-CM | POA: Insufficient documentation

## 2011-09-18 DIAGNOSIS — I350 Nonrheumatic aortic (valve) stenosis: Secondary | ICD-10-CM

## 2011-09-18 DIAGNOSIS — J449 Chronic obstructive pulmonary disease, unspecified: Secondary | ICD-10-CM | POA: Insufficient documentation

## 2011-09-18 DIAGNOSIS — E785 Hyperlipidemia, unspecified: Secondary | ICD-10-CM | POA: Insufficient documentation

## 2011-09-18 DIAGNOSIS — E669 Obesity, unspecified: Secondary | ICD-10-CM | POA: Insufficient documentation

## 2011-09-18 DIAGNOSIS — E7800 Pure hypercholesterolemia, unspecified: Secondary | ICD-10-CM

## 2011-09-18 LAB — BASIC METABOLIC PANEL
BUN: 33 mg/dL — ABNORMAL HIGH (ref 6–23)
CO2: 24 mEq/L (ref 19–32)
Calcium: 9.2 mg/dL (ref 8.4–10.5)
Chloride: 108 mEq/L (ref 96–112)
Creatinine, Ser: 1.2 mg/dL (ref 0.4–1.5)
GFR: 64.77 mL/min (ref >60.00)
Glucose, Bld: 101 mg/dL — ABNORMAL HIGH (ref 70–99)
Potassium: 4.7 mEq/L (ref 3.5–5.1)
Sodium: 137 mEq/L (ref 135–145)

## 2011-09-18 LAB — HEPATIC FUNCTION PANEL
ALT: 17 U/L (ref 0–53)
AST: 24 U/L (ref 0–37)
Albumin: 3.9 g/dL (ref 3.5–5.2)
Alkaline Phosphatase: 53 U/L (ref 39–117)
Bilirubin, Direct: 0.1 mg/dL (ref 0.0–0.3)
Total Bilirubin: 0.3 mg/dL (ref 0.3–1.2)
Total Protein: 7.4 g/dL (ref 6.0–8.3)

## 2011-09-18 LAB — LIPID PANEL
Cholesterol: 107 mg/dL (ref 0–200)
HDL: 35.2 mg/dL — ABNORMAL LOW (ref >39.00)
LDL Cholesterol: 51 mg/dL (ref 0–99)
Total CHOL/HDL Ratio: 3
Triglycerides: 106 mg/dL (ref 0.0–149.0)
VLDL: 21.2 mg/dL (ref 0.0–40.0)

## 2011-09-23 ENCOUNTER — Ambulatory Visit: Payer: PRIVATE HEALTH INSURANCE

## 2011-10-13 ENCOUNTER — Ambulatory Visit: Payer: PRIVATE HEALTH INSURANCE | Attending: Orthopedic Surgery

## 2011-10-13 DIAGNOSIS — M25519 Pain in unspecified shoulder: Secondary | ICD-10-CM | POA: Insufficient documentation

## 2011-10-13 DIAGNOSIS — IMO0001 Reserved for inherently not codable concepts without codable children: Secondary | ICD-10-CM | POA: Insufficient documentation

## 2011-10-13 DIAGNOSIS — M25619 Stiffness of unspecified shoulder, not elsewhere classified: Secondary | ICD-10-CM | POA: Insufficient documentation

## 2011-10-13 DIAGNOSIS — R5381 Other malaise: Secondary | ICD-10-CM | POA: Insufficient documentation

## 2011-10-13 DIAGNOSIS — M6281 Muscle weakness (generalized): Secondary | ICD-10-CM | POA: Insufficient documentation

## 2011-12-03 ENCOUNTER — Other Ambulatory Visit: Payer: Self-pay | Admitting: Cardiology

## 2012-03-02 ENCOUNTER — Other Ambulatory Visit: Payer: Self-pay | Admitting: Cardiology

## 2012-07-05 ENCOUNTER — Encounter: Payer: Self-pay | Admitting: *Deleted

## 2012-07-05 ENCOUNTER — Encounter: Payer: Self-pay | Admitting: Cardiology

## 2012-07-05 ENCOUNTER — Ambulatory Visit (INDEPENDENT_AMBULATORY_CARE_PROVIDER_SITE_OTHER): Payer: 59 | Admitting: Cardiology

## 2012-07-05 VITALS — BP 135/80 | HR 68 | Ht 66.0 in | Wt 202.2 lb

## 2012-07-05 DIAGNOSIS — I359 Nonrheumatic aortic valve disorder, unspecified: Secondary | ICD-10-CM

## 2012-07-05 DIAGNOSIS — I2510 Atherosclerotic heart disease of native coronary artery without angina pectoris: Secondary | ICD-10-CM

## 2012-07-05 DIAGNOSIS — E785 Hyperlipidemia, unspecified: Secondary | ICD-10-CM

## 2012-07-05 DIAGNOSIS — I350 Nonrheumatic aortic (valve) stenosis: Secondary | ICD-10-CM

## 2012-07-05 DIAGNOSIS — Z79899 Other long term (current) drug therapy: Secondary | ICD-10-CM

## 2012-07-05 LAB — LIPID PANEL
Cholesterol: 122 mg/dL (ref 0–200)
HDL: 30.6 mg/dL — ABNORMAL LOW (ref >39.00)
LDL Cholesterol: 63 mg/dL (ref 0–99)
Total CHOL/HDL Ratio: 4
Triglycerides: 141 mg/dL (ref 0.0–149.0)
VLDL: 28.2 mg/dL (ref 0.0–40.0)

## 2012-07-05 LAB — BASIC METABOLIC PANEL
BUN: 28 mg/dL — ABNORMAL HIGH (ref 6–23)
CO2: 28 mEq/L (ref 19–32)
Calcium: 8.8 mg/dL (ref 8.4–10.5)
Chloride: 105 mEq/L (ref 96–112)
Creatinine, Ser: 1.2 mg/dL (ref 0.4–1.5)
GFR: 67.86 mL/min (ref >60.00)
Glucose, Bld: 81 mg/dL (ref 70–99)
Potassium: 4.3 mEq/L (ref 3.5–5.1)
Sodium: 140 mEq/L (ref 135–145)

## 2012-07-05 LAB — HEPATIC FUNCTION PANEL
ALT: 17 U/L (ref 0–53)
AST: 24 U/L (ref 0–37)
Albumin: 4 g/dL (ref 3.5–5.2)
Alkaline Phosphatase: 55 U/L (ref 39–117)
Bilirubin, Direct: 0 mg/dL (ref 0.0–0.3)
Total Bilirubin: 0.7 mg/dL (ref 0.3–1.2)
Total Protein: 7.5 g/dL (ref 6.0–8.3)

## 2012-07-05 NOTE — Assessment & Plan Note (Signed)
History of atrial fibrillation in the setting of cardiomyopathy but no recurrences since his LV function has improved. Continue aspirin and beta blocker.

## 2012-07-05 NOTE — Assessment & Plan Note (Signed)
Continue statin. Check lipids and liver. 

## 2012-07-05 NOTE — Assessment & Plan Note (Signed)
Continue aspirin and statin. 

## 2012-07-05 NOTE — Assessment & Plan Note (Signed)
Improved on most recent echocardiogram. Continue beta blocker and ACE inhibitor. Previously felt secondary to alcohol use which he stopped years ago.

## 2012-07-05 NOTE — Patient Instructions (Addendum)
The current medical regimen is effective;  continue present plan and medications.  Please have blood work today  Follow up in 1 year with Dr [name].  You will receive a letter in the mail 2 months before you are due.  Please call us when you receive this letter to schedule your follow up appointment.

## 2012-07-05 NOTE — Progress Notes (Signed)
HPI: Mr. Rengel is a very pleasant gentleman with a history of nonischemic cardiomyopathy improved by most recent echocardiogram. He also has a history of coronary artery disease with cardiomyopathy out of proportion; alcohol use, now resolved; and paroxysmal atrial fibrillation holding sinus rhythm. Cardiac catheterization in October of 2007 showed a 20% left main, a 60% mid LAD and a 60-70% distal LAD. There was a 30-40% first diagonal. There was a 30% circumflex and a 40% OM. There was a 30-40% right coronary artery. Ejection fraction was 25-30%. Last echocardiogram in March of 2013 showed normal LV function and mild aortic stenosis with a mean gradient of 14 mm of mercury. Abdominal ultrasound in February 2012 showed no aneurysm. I last saw him in March of 2013. Since then the patient denies any dyspnea on exertion, orthopnea, PND, pedal edema, palpitations, syncope or chest pain.    Current Outpatient Prescriptions  Medication Sig Dispense Refill  . aspirin EC 325 MG tablet TAKE 1 TABLET BY MOUTH DAILY.  30 tablet  11  . atorvastatin (LIPITOR) 40 MG tablet Take 1 tablet (40 mg total) by mouth every morning.  90 tablet  12  . carvedilol (COREG) 12.5 MG tablet TAKE 1 TABLET BY MOUTH TWICE DAILY  90 tablet  3  . lisinopril (PRINIVIL,ZESTRIL) 20 MG tablet Take 20 mg by mouth 2 (two) times daily.        . naproxen sodium (ANAPROX) 220 MG tablet Take 220 mg by mouth daily. Take 2 tabs daily         Past Medical History  Diagnosis Date  . Hyperlipidemia   . Anemia     IDA  . COPD (chronic obstructive pulmonary disease)   . Cirrhosis   . Substance abuse     alcohol  . Gastric ulcer 2007    Diagnosed by Dr. Elnoria Howard 2007 by EGD  . ED (erectile dysfunction)   . CAD (coronary artery disease)   . Cardiomyopathy     alcoholic CM, EF 25-30%, per cath 2007  . Complication of anesthesia     pt stated he quit breathing  . A-fib     not a candidate for anticoagualted due to alcohol abuse and  gastric ulcer  . Hypertension     ,dr [name],  echo in epic  . Aortic stenosis     Past Surgical History  Procedure Date  . Tonsillectomy   . Joint replacement     rt knee,rt shoulder  . Cardiac catheterization   . Total shoulder arthroplasty 05/29/2011    Procedure: TOTAL SHOULDER ARTHROPLASTY;  Surgeon: Vania Rea Supple;  Location: [facility] OR;  Service: Orthopedics;  Laterality: Right;    History   Social History  . Marital Status: Married    Spouse Name: N/A    Number of Children: N/A  . Years of Education: N/A   Occupational History  . Not on file.   Social History Main Topics  . Smoking status: Former Smoker    Types: Cigarettes    Quit date: 04/18/2010  . Smokeless tobacco: Not on file  . Alcohol Use: No     Comment: qiuit drinking 07  . Drug Use: No  . Sexually Active:    Other Topics Concern  . Not on file   Social History Narrative   Prior ETOH abuse now resolvedTobacco use- yes    ROS: arthralgias but no fevers or chills, productive cough, hemoptysis, dysphasia, odynophagia, melena, hematochezia, dysuria, hematuria, rash, seizure activity, orthopnea, PND, pedal edema,  claudication. Remaining systems are negative.  Physical Exam: Well-developed well-nourished in no acute distress.  Skin is warm and dry.  HEENT is normal.  Neck is supple.  Chest is clear to auscultation with normal expansion.  Cardiovascular exam is regular rate and rhythm. 2/6 systolic murmur left sternal border. Abdominal exam nontender or distended. No masses palpated. Extremities show no edema. neuro grossly intact  ECG sinus rhythm with first degree AV block. No ST changes.

## 2012-07-05 NOTE — Assessment & Plan Note (Signed)
Plan repeat echocardiogram when he returns in one year. 

## 2012-07-06 ENCOUNTER — Encounter: Payer: Self-pay | Admitting: *Deleted

## 2012-07-06 NOTE — Progress Notes (Signed)
Quick Note:  Ok [name]  ______

## 2012-07-09 ENCOUNTER — Telehealth: Payer: Self-pay | Admitting: Cardiology

## 2012-07-09 NOTE — Telephone Encounter (Signed)
New Problem:    Patient called in wanting to know the results of his latest lab work.  Please call back.

## 2012-07-09 NOTE — Telephone Encounter (Signed)
Spoke with pt, aware of lab results. 

## 2012-07-21 ENCOUNTER — Telehealth: Payer: Self-pay | Admitting: Internal Medicine

## 2012-07-21 NOTE — Telephone Encounter (Signed)
Patient was last seen in the clinic in 2011.  It has been over two years.  Patient will be considered new if he decides to return.  Removing [facility] doctor's name from his chart.

## 2012-08-26 ENCOUNTER — Other Ambulatory Visit: Payer: Self-pay | Admitting: *Deleted

## 2012-08-26 MED ORDER — ATORVASTATIN CALCIUM 40 MG PO TABS: 40.0000 mg | ORAL_TABLET | ORAL | Status: DC

## 2012-08-27 ENCOUNTER — Other Ambulatory Visit: Payer: Self-pay | Admitting: Cardiology

## 2012-10-04 ENCOUNTER — Telehealth: Payer: Self-pay | Admitting: Cardiology

## 2012-10-04 MED ORDER — LISINOPRIL 20 MG PO TABS: 20.0000 mg | ORAL_TABLET | Freq: Two times a day (BID) | ORAL | Status: DC

## 2012-10-04 MED ORDER — ASPIRIN EC 325 MG PO TBEC: 325.0000 mg | DELAYED_RELEASE_TABLET | Freq: Every day | ORAL | Status: DC

## 2012-10-04 MED ORDER — ATORVASTATIN CALCIUM 40 MG PO TABS: 40.0000 mg | ORAL_TABLET | ORAL | Status: DC

## 2012-10-04 MED ORDER — CARVEDILOL 12.5 MG PO TABS: 12.5000 mg | ORAL_TABLET | Freq: Two times a day (BID) | ORAL | Status: DC

## 2012-10-04 NOTE — Telephone Encounter (Signed)
New problem     Patient calling back to speak with nurse.

## 2012-10-04 NOTE — Telephone Encounter (Signed)
Spoke with pt, scripts mailed to pt

## 2013-02-02 ENCOUNTER — Other Ambulatory Visit: Payer: Self-pay

## 2013-05-05 ENCOUNTER — Other Ambulatory Visit: Payer: Self-pay

## 2013-07-19 ENCOUNTER — Ambulatory Visit: Payer: Self-pay | Admitting: Cardiology

## 2013-07-26 ENCOUNTER — Ambulatory Visit: Payer: Self-pay | Admitting: Cardiology

## 2013-08-03 ENCOUNTER — Encounter: Payer: Self-pay | Admitting: Internal Medicine

## 2013-08-03 ENCOUNTER — Ambulatory Visit (INDEPENDENT_AMBULATORY_CARE_PROVIDER_SITE_OTHER): Payer: Medicare HMO | Admitting: Internal Medicine

## 2013-08-03 VITALS — BP 98/68 | HR 79 | Temp 97.4°F | Ht 66.0 in | Wt 189.3 lb

## 2013-08-03 DIAGNOSIS — E785 Hyperlipidemia, unspecified: Secondary | ICD-10-CM

## 2013-08-03 DIAGNOSIS — N179 Acute kidney failure, unspecified: Secondary | ICD-10-CM

## 2013-08-03 DIAGNOSIS — M25519 Pain in unspecified shoulder: Secondary | ICD-10-CM

## 2013-08-03 DIAGNOSIS — G8929 Other chronic pain: Secondary | ICD-10-CM

## 2013-08-03 DIAGNOSIS — I10 Essential (primary) hypertension: Secondary | ICD-10-CM

## 2013-08-03 DIAGNOSIS — M19019 Primary osteoarthritis, unspecified shoulder: Secondary | ICD-10-CM | POA: Insufficient documentation

## 2013-08-03 DIAGNOSIS — M545 Low back pain, unspecified: Secondary | ICD-10-CM

## 2013-08-03 DIAGNOSIS — I428 Other cardiomyopathies: Secondary | ICD-10-CM

## 2013-08-03 DIAGNOSIS — M5386 Other specified dorsopathies, lumbar region: Secondary | ICD-10-CM

## 2013-08-03 DIAGNOSIS — D509 Iron deficiency anemia, unspecified: Secondary | ICD-10-CM

## 2013-08-03 DIAGNOSIS — E875 Hyperkalemia: Secondary | ICD-10-CM

## 2013-08-03 DIAGNOSIS — K7460 Unspecified cirrhosis of liver: Secondary | ICD-10-CM

## 2013-08-03 HISTORY — DX: Other specified dorsopathies, lumbar region: M53.86

## 2013-08-03 HISTORY — DX: Primary osteoarthritis, unspecified shoulder: M19.019

## 2013-08-03 HISTORY — DX: Essential (primary) hypertension: I10

## 2013-08-03 LAB — COMPLETE METABOLIC PANEL WITH GFR
ALT: 15 U/L (ref 0–53)
AST: 20 U/L (ref 0–37)
Albumin: 4.2 g/dL (ref 3.5–5.2)
Alkaline Phosphatase: 57 U/L (ref 39–117)
BUN: 52 mg/dL — ABNORMAL HIGH (ref 6–23)
CO2: 20 mEq/L (ref 19–32)
Calcium: 9.5 mg/dL (ref 8.4–10.5)
Chloride: 108 mEq/L (ref 96–112)
Creat: 1.56 mg/dL — ABNORMAL HIGH (ref 0.50–1.35)
GFR, Est African American: 53 mL/min — ABNORMAL LOW
GFR, Est Non African American: 46 mL/min — ABNORMAL LOW
Glucose, Bld: 97 mg/dL (ref 70–99)
Potassium: 5.4 mEq/L — ABNORMAL HIGH (ref 3.5–5.3)
Sodium: 138 mEq/L (ref 135–145)
Total Bilirubin: 0.5 mg/dL (ref 0.2–1.2)
Total Protein: 7.4 g/dL (ref 6.0–8.3)

## 2013-08-03 LAB — CBC WITH DIFFERENTIAL/PLATELET
Basophils Absolute: 0.1 10*3/uL (ref 0.0–0.1)
Basophils Relative: 1 % (ref 0–1)
Eosinophils Absolute: 0.4 10*3/uL (ref 0.0–0.7)
Eosinophils Relative: 4 % (ref 0–5)
HCT: 39.9 % (ref 39.0–52.0)
Hemoglobin: 13.8 g/dL (ref 13.0–17.0)
Lymphocytes Relative: 29 % (ref 12–46)
Lymphs Abs: 2.9 10*3/uL (ref 0.7–4.0)
MCH: 31.4 pg (ref 26.0–34.0)
MCHC: 34.6 g/dL (ref 30.0–36.0)
MCV: 90.7 fL (ref 78.0–100.0)
Monocytes Absolute: 0.6 10*3/uL (ref 0.1–1.0)
Monocytes Relative: 6 % (ref 3–12)
Neutro Abs: 5.9 10*3/uL (ref 1.7–7.7)
Neutrophils Relative %: 60 % (ref 43–77)
Platelets: 211 10*3/uL (ref 150–400)
RBC: 4.4 MIL/uL (ref 4.22–5.81)
RDW: 13.2 % (ref 11.5–15.5)
WBC: 9.8 10*3/uL (ref 4.0–10.5)

## 2013-08-03 LAB — LIPID PANEL
Cholesterol: 113 mg/dL (ref 0–200)
HDL: 34 mg/dL — ABNORMAL LOW (ref >39)
LDL Cholesterol: 60 mg/dL (ref 0–99)
Total CHOL/HDL Ratio: 3.3 Ratio
Triglycerides: 97 mg/dL (ref <150)
VLDL: 19 mg/dL (ref 0–40)

## 2013-08-03 NOTE — Assessment & Plan Note (Signed)
Assessment: Patient reports chronic lower back pain with occasional sciatica pain along her left lower extremity.  He is asymptomatic today. Denies numbness, tingling or weakness. Denies bowel/urine incontinence.  Physical examination showed hyperreflexia on left LE.  The etiology of his left LE hyperreflexia is likely suggestive L2-4 radiculopathy, which corollate with her lumbar MRI in 2007.   Plan:  - continue to monitor. - patient is instructed to call the clinic if he experience lower back pain with LE weakness and/or urine/bowel incontinence.  - will not pursue any imaging study for now.

## 2013-08-03 NOTE — Patient Instructions (Signed)
1. Will check your blood work today 2. Shingle vaccine Rx is given 3. Will get the records from your GI doctor office 4. Will send the referral for you.  5. Follow up in 3 months.

## 2013-08-03 NOTE — Assessment & Plan Note (Signed)
Assessment: He reports medical compliance with statin.    Plan: - follow up no FLP today

## 2013-08-03 NOTE — Assessment & Plan Note (Signed)
Assessment: Patient has a history of NICM followed by [facility] cardiology, his last echoin 2014 showed improved EF. He is supposed to follow up with his Cardiologist on 08/18/13 for a repeat echo. He states that he is doing well except for 2- year duration of mild intermittent DOE in winter when he does some yard work. Associated symptoms include sneezing, rhinorrhea, itching eyes. Denies orthopnea and PND. Always uses two pillows at night. Denies LE swelling, wheezing, chest pain, chest pressure, dizziness, lightheadedness, or LOC. Admits weight gain stating," I am working on losing weight". He thinks that he has allergic rhinitis but does not want to try any allergy medicine. He would like to discuss with his cardiologist next week.   Physical examination is unremarkable. No S/S fluid overload.     Plan: - his insurance requires him to have a cardiology referral sent by our office.  - follow up with the cardiologist as scheduled.

## 2013-08-03 NOTE — Progress Notes (Signed)
Case discussed with Dr. Li soon after the resident saw the patient. We reviewed the resident's history and exam and pertinent patient test results. I agree with the assessment, diagnosis, and plan of care documented in the resident's note. 

## 2013-08-03 NOTE — Progress Notes (Signed)
Patient: [name]   MRN: [id number]  DOB: [birth year]  PCP: Karren Cobble, MD   Subjective:    CC: Follow-up   HPI: [name] is a 68 y.o. male with a PMHx of HTN, HLD, hx of alcohol abuse and liver cirrhosis, alcoholic CM per cath in 4270, A fib on ASA and BB, gastric ulcer followed by Dr. Benson Norway, COPD and IDA, who presented to clinic today to re-establish the care.  Patient states that he is doing ok. The main reason that he is here to re establish care is to obtain the referral per his insurance requirement.    1) CM, A Fib, HTN and HLD, stable.     Been followed by his cardiology with last OV in Jan 2014.     Health maintenance - CBC,  - influenza vaccine done Nov 2014.  - Pneumococcal Vaccine Nov 2014. - shingle vaccine Rx given.  - Tetanus shot 8 years ago. Patient will ask employee health.  - colonoscopy--done within last 10 years. Will ask the records from Dr. Benson Norway office.  - Needs referral for Cardiology and sports medicine per his insurance requirement.   Review of Systems: Per HPI.   Current Outpatient Medications: Current Outpatient Prescriptions  Medication Sig Dispense Refill  . aspirin EC 325 MG tablet Take 1 tablet (325 mg total) by mouth daily.  90 tablet  3  . atorvastatin (LIPITOR) 40 MG tablet TAKE 1 TABLET BY MOUTH EVERY MORNING  90 tablet  12  . carvedilol (COREG) 12.5 MG tablet Take 1 tablet (12.5 mg total) by mouth 2 (two) times daily with a meal.  180 tablet  3  . lisinopril (PRINIVIL,ZESTRIL) 20 MG tablet Take 1 tablet (20 mg total) by mouth 2 (two) times daily.  180 tablet  4   No current facility-administered medications for this visit.    Allergies: No Known Allergies  Past Medical History  Diagnosis Date  . Hyperlipidemia   . Anemia     IDA  . COPD (chronic obstructive pulmonary disease)   . Cirrhosis   . Substance abuse     alcohol  . Gastric ulcer 2007    Diagnosed by Dr. Benson Norway 2007 by EGD  . ED (erectile  dysfunction)   . CAD (coronary artery disease)   . Cardiomyopathy     alcoholic CM, EF 62-37%, per cath 2007  . Complication of anesthesia     pt stated he quit breathing  . A-fib     not a candidate for anticoagualted due to alcohol abuse and gastric ulcer  . Hypertension     ,dr Stanford Breed,  echo in epic  . Aortic stenosis     Objective:    Physical Exam: Filed Vitals:   08/03/13 1009  BP: 98/68  Pulse: 79  Temp: 97.4 F (36.3 C)  TempSrc: Oral  Height: 5\' 6"  (1.676 m)  Weight: 189 lb 4.8 oz (85.866 kg)  SpO2: 97%     General: Vital signs reviewed and noted. Well-developed, well-nourished, in no acute distress; alert, appropriate and cooperative throughout examination.  Head: Normocephalic, atraumatic.  Lungs:  Normal respiratory effort. Clear to auscultation BL without crackles or wheezes.  Heart: RRR. S1 and S2 normal without gallop, rubs, murmur.  Abdomen:  BS normoactive. Soft, Nondistended, non-tender.  No masses or organomegaly.  Extremities: No pretibial edema.  Mental Status: Alert, oriented, thought content appropriate.  Speech fluent without evidence of aphasia.  Able to follow 3 step commands  without difficulty. Cranial Nerves: II: Discs flat bilaterally; Visual fields grossly normal, pupils equal, round, reactive to light and accommodation III,IV, VI: ptosis not present, extra-ocular motions intact bilaterally V,VII: smile symmetric, facial light touch sensation normal bilaterally VIII: hearing normal bilaterally IX,X: gag reflex present XI: bilateral shoulder shrug XII: midline tongue extension without atrophy or fasciculations  Motor: Right : Upper extremity   5/5    Left:     Upper extremity   5/5  Lower extremity   5/5     Lower extremity   5/5 Tone and bulk:normal tone throughout; no atrophy noted Sensory: Pinprick and light touch intact throughout, bilaterally Deep Tendon Reflexes:  Right: Upper Extremity   Left: Upper extremity   biceps (C-5 to  C-6) 2/4   biceps (C-5 to C-6) 2/4 tricep (C7) 2/4    triceps (C7) 2/4 Brachioradialis (C6) 2/4  Brachioradialis (C6) 2/4  Lower Extremity Lower Extremity  quadriceps (L-2 to L-4) 2/4   quadriceps (L-2 to L-4) 3/4 Achilles (S1) 2/4   Achilles (S1) 2/4  Plantars: Right: downgoing   Left: downgoing Cerebellar: normal finger-to-nose,  normal heel-to-shin test Gait: stable.  CV: pulses palpable throughout    Assessment/ Plan:

## 2013-08-03 NOTE — Assessment & Plan Note (Signed)
Assessment: Hx of mild IDA not on iron supplement now. He is asymptomatic.   Plan: - will check CBC since last CBC in 2012.

## 2013-08-03 NOTE — Assessment & Plan Note (Signed)
Assessment Patient is asymptomatic. Denies any GIB. Alcohol abstinence since 2007 per patient report.  Last Liver function, platelet WNL.  Last abd U/S in 2007 showed " Diffuse hepatocellular disease. There are some subtle findings, which may suggest the presence of cirrhosis; however, that diagnosis cannot be made with certaint" .  Per epic, patient had EGD done in 2017 with a finding of gastric ulcer followed by Dr. Benson Norway.  Plan - will repeat PLT and liver function - will obtain records of EGD and colonoscopy - follow up in 3 months

## 2013-08-03 NOTE — Assessment & Plan Note (Signed)
BP Readings from Last 3 Encounters:  08/03/13 98/68  07/05/12 135/80  09/08/11 121/77    Lab Results  Component Value Date   [name] 140 07/05/2012   K 4.3 07/05/2012   CREATININE 1.2 07/05/2012    Assessment: Blood pressure control: controlled Progress toward BP goal:  at goal   Plan: Medications:  continue current medications Educational resources provided:   Self management tools provided:

## 2013-08-03 NOTE — Assessment & Plan Note (Signed)
Assessment: Patient has been following up with his surgeon Dr. Onnie Graham for management of left shoulder problem. He had steroid injection in Dec 2014. He states that his pain is baseline. He recently changed his insurance and is requested to have an new referral from our office.  Plan: - will send the orthopedic referral

## 2013-08-04 ENCOUNTER — Other Ambulatory Visit: Payer: Self-pay

## 2013-08-04 ENCOUNTER — Encounter: Payer: Self-pay | Admitting: Internal Medicine

## 2013-08-04 ENCOUNTER — Telehealth: Payer: Self-pay | Admitting: Internal Medicine

## 2013-08-04 ENCOUNTER — Emergency Department (HOSPITAL_COMMUNITY): Payer: Medicare HMO

## 2013-08-04 ENCOUNTER — Emergency Department (HOSPITAL_COMMUNITY)
Admission: EM | Admit: 2013-08-04 | Discharge: 2013-08-04 | Disposition: A | Discharge status: Home / Self Care | Payer: Medicare HMO | Attending: Emergency Medicine | Admitting: Emergency Medicine

## 2013-08-04 ENCOUNTER — Encounter (HOSPITAL_COMMUNITY): Payer: Self-pay | Admitting: Emergency Medicine

## 2013-08-04 ENCOUNTER — Other Ambulatory Visit (INDEPENDENT_AMBULATORY_CARE_PROVIDER_SITE_OTHER): Payer: Medicare HMO

## 2013-08-04 DIAGNOSIS — E875 Hyperkalemia: Secondary | ICD-10-CM | POA: Insufficient documentation

## 2013-08-04 DIAGNOSIS — Z862 Personal history of diseases of the blood and blood-forming organs and certain disorders involving the immune mechanism: Secondary | ICD-10-CM | POA: Insufficient documentation

## 2013-08-04 DIAGNOSIS — R011 Cardiac murmur, unspecified: Secondary | ICD-10-CM | POA: Insufficient documentation

## 2013-08-04 DIAGNOSIS — Z87891 Personal history of nicotine dependence: Secondary | ICD-10-CM | POA: Insufficient documentation

## 2013-08-04 DIAGNOSIS — J4489 Other specified chronic obstructive pulmonary disease: Secondary | ICD-10-CM | POA: Insufficient documentation

## 2013-08-04 DIAGNOSIS — I10 Essential (primary) hypertension: Secondary | ICD-10-CM | POA: Insufficient documentation

## 2013-08-04 DIAGNOSIS — N179 Acute kidney failure, unspecified: Secondary | ICD-10-CM

## 2013-08-04 DIAGNOSIS — E785 Hyperlipidemia, unspecified: Secondary | ICD-10-CM | POA: Insufficient documentation

## 2013-08-04 DIAGNOSIS — I2510 Atherosclerotic heart disease of native coronary artery without angina pectoris: Secondary | ICD-10-CM | POA: Insufficient documentation

## 2013-08-04 DIAGNOSIS — J449 Chronic obstructive pulmonary disease, unspecified: Secondary | ICD-10-CM | POA: Insufficient documentation

## 2013-08-04 DIAGNOSIS — Z79899 Other long term (current) drug therapy: Secondary | ICD-10-CM | POA: Insufficient documentation

## 2013-08-04 DIAGNOSIS — Z7982 Long term (current) use of aspirin: Secondary | ICD-10-CM | POA: Insufficient documentation

## 2013-08-04 DIAGNOSIS — Z9889 Other specified postprocedural states: Secondary | ICD-10-CM | POA: Insufficient documentation

## 2013-08-04 DIAGNOSIS — Z8719 Personal history of other diseases of the digestive system: Secondary | ICD-10-CM | POA: Insufficient documentation

## 2013-08-04 LAB — COMPREHENSIVE METABOLIC PANEL
ALT: 15 U/L (ref 0–53)
AST: 24 U/L (ref 0–37)
Albumin: 4 g/dL (ref 3.5–5.2)
Alkaline Phosphatase: 55 U/L (ref 39–117)
BUN: 61 mg/dL — ABNORMAL HIGH (ref 6–23)
CO2: 17 mEq/L — ABNORMAL LOW (ref 19–32)
Calcium: 9.1 mg/dL (ref 8.4–10.5)
Chloride: 100 mEq/L (ref 96–112)
Creatinine, Ser: 1.73 mg/dL — ABNORMAL HIGH (ref 0.50–1.35)
GFR calc Af Amer: 46 mL/min — ABNORMAL LOW (ref >90)
GFR calc non Af Amer: 40 mL/min — ABNORMAL LOW (ref >90)
Glucose, Bld: 72 mg/dL (ref 70–99)
Potassium: 5.6 mEq/L — ABNORMAL HIGH (ref 3.7–5.3)
Sodium: 134 mEq/L — ABNORMAL LOW (ref 137–147)
Total Bilirubin: 0.6 mg/dL (ref 0.3–1.2)
Total Protein: 7.3 g/dL (ref 6.0–8.3)

## 2013-08-04 LAB — URINALYSIS, ROUTINE W REFLEX MICROSCOPIC
Bilirubin Urine: NEGATIVE
Glucose, UA: NEGATIVE mg/dL
Hgb urine dipstick: NEGATIVE
Ketones, ur: NEGATIVE mg/dL
Leukocytes, UA: NEGATIVE
Nitrite: NEGATIVE
Protein, ur: NEGATIVE mg/dL
Specific Gravity, Urine: 1.005 (ref 1.005–1.030)
Urobilinogen, UA: 0.2 mg/dL (ref 0.0–1.0)
pH: 5 (ref 5.0–8.0)

## 2013-08-04 LAB — CBC WITH DIFFERENTIAL/PLATELET
Basophils Absolute: 0.1 10*3/uL (ref 0.0–0.1)
Basophils Relative: 1 % (ref 0–1)
Eosinophils Absolute: 0.3 10*3/uL (ref 0.0–0.7)
Eosinophils Relative: 3 % (ref 0–5)
HCT: 36.8 % — ABNORMAL LOW (ref 39.0–52.0)
Hemoglobin: 12.9 g/dL — ABNORMAL LOW (ref 13.0–17.0)
Lymphocytes Relative: 32 % (ref 12–46)
Lymphs Abs: 2.8 10*3/uL (ref 0.7–4.0)
MCH: 32.1 pg (ref 26.0–34.0)
MCHC: 35.1 g/dL (ref 30.0–36.0)
MCV: 91.5 fL (ref 78.0–100.0)
Monocytes Absolute: 0.7 10*3/uL (ref 0.1–1.0)
Monocytes Relative: 8 % (ref 3–12)
Neutro Abs: 5 10*3/uL (ref 1.7–7.7)
Neutrophils Relative %: 57 % (ref 43–77)
Platelets: 177 10*3/uL (ref 150–400)
RBC: 4.02 MIL/uL — ABNORMAL LOW (ref 4.22–5.81)
RDW: 12.7 % (ref 11.5–15.5)
WBC: 8.8 10*3/uL (ref 4.0–10.5)

## 2013-08-04 LAB — BASIC METABOLIC PANEL WITH GFR
BUN: 63 mg/dL — ABNORMAL HIGH (ref 6–23)
CO2: 19 mEq/L (ref 19–32)
Calcium: 9.2 mg/dL (ref 8.4–10.5)
Chloride: 104 mEq/L (ref 96–112)
Creat: 2.04 mg/dL — ABNORMAL HIGH (ref 0.50–1.35)
GFR, Est African American: 38 mL/min — ABNORMAL LOW
GFR, Est Non African American: 33 mL/min — ABNORMAL LOW
Glucose, Bld: 88 mg/dL (ref 70–99)
Potassium: 5.6 mEq/L — ABNORMAL HIGH (ref 3.5–5.3)
Sodium: 140 mEq/L (ref 135–145)

## 2013-08-04 LAB — PRO B NATRIURETIC PEPTIDE: Pro B Natriuretic peptide (BNP): 62 pg/mL (ref 0–125)

## 2013-08-04 LAB — POTASSIUM: Potassium: 5.3 mEq/L (ref 3.7–5.3)

## 2013-08-04 MED ORDER — SODIUM CHLORIDE 0.9 % IV BOLUS (SEPSIS)
500.0000 mL | Freq: Once | INTRAVENOUS | Status: AC
  Administered 2013-08-04: 500 mL via INTRAVENOUS

## 2013-08-04 MED ORDER — SODIUM POLYSTYRENE SULFONATE 15 GM/60ML PO SUSP
15.0000 g | Freq: Once | ORAL | Status: AC
  Administered 2013-08-04: 15 g via ORAL
  Filled 2013-08-04: qty 60

## 2013-08-04 MED ORDER — FUROSEMIDE 10 MG/ML IJ SOLN
40.0000 mg | Freq: Once | INTRAMUSCULAR | Status: AC
  Administered 2013-08-04: 40 mg via INTRAVENOUS
  Filled 2013-08-04: qty 4

## 2013-08-04 NOTE — Assessment & Plan Note (Signed)
  Recent Labs Lab 08/03/13 1107  Icarus Partch 138  K 5.4*  CL 108  CO2 20  GLUCOSE 97  BUN 52*  CREATININE 1.56*  CALCIUM 9.5   - mild elevated potassium level of 5.4 in the setting of AKI and ACEI - will ask him to stop ASA and ACEI and encourage increase oral intake.  - will repeat BMP today.

## 2013-08-04 NOTE — ED Notes (Signed)
Spoke with lab, they report potassium is in process at this time.

## 2013-08-04 NOTE — Assessment & Plan Note (Signed)
Addendum:  Patient's BMP is back.  Recent Labs Lab 08/03/13 1107  Locke Barrell 138  K 5.4*  CL 108  CO2 20  GLUCOSE 97  BUN 52*  CREATININE 1.56*  CALCIUM 9.5   The lab indicated AKI. The etiology is likely associated with medications such as NSAIDs ASA 325 and ACEI. And the ratio of BUN and Cr is suggestive of volume depletion.   Plan: - will hold ASA and ACEI for now - Continue his beta blocker - instruct patient to increase oral fluid intake. - avoid OTC NSAIDs  - follow up BMP in 2-3 days.    List of NSAIDS (Non-steroidal anti-inflammatory medications) AVOID THESE MEDICATIONS  Aspirin (Anacin, Ascriptin, Bayer, Bufferin, Ecotrin, Excedrin) Choline and magnesium salicylates (CMT, Tricosal, Trilisate) Choline salicylate (Arthropan) Celecoxib (Celebrex) Diclofenac potassium (Cataflam) Diclofenac sodium (Voltaren, Voltaren XR) Diclofenac sodium with misoprostol (Arthrotec) Diflunisal (Dolobid) Etodolac (Lodine, Lodine XL) Fenoprofen calcium (Nalfon) Flurbiprofen (Ansaid) Ibuprofen (Advil, Motrin, Motrin IB, Nuprin) Indomethacin (Indocin, Indocin SR) Ketoprofen (Actron, Orudis, Orudis KT, Oruvail) Magnesium salicylate (Arthritab, Bayer Select, Doan's Pills, Magan, Mobidin, Mobogesic) Meclofenamate sodium (Meclomen) Mefenamic acid (Ponstel) Meloxicam (Mobic) Nabumetone (Relafen) Naproxen (Naprosyn, Naprelan*) Naproxen sodium (Aleve, Anaprox) Oxaprozin (Daypro) Piroxicam (Feldene) Rofecoxib (Vioxx) Salsalate (Amigesic, Anaflex 750, Disalcid, Marthritic, Mono-Gesic, Salflex, Salsitab) Sodium salicylate (various generics) Sulindac (Clinoril) Tolmetin sodium (Tolectin) Valdecoxib (Bextra)

## 2013-08-04 NOTE — Discharge Instructions (Signed)
As discussed, it is important that you follow up as soon as possible with your physician for continued management of your condition.  If you develop any new, or concerning changes in your condition, please return to the emergency department immediately.  Hyperkalemia Hyperkalemia is when you have too much potassium in your blood. This can be a life-threatening condition. Potassium is normally removed (excreted) from the body by the kidneys. CAUSES  The potassium level in your body can become too high for the following reasons:  You take in too much potassium. You can do this by:  Using salt substitutes. They contain large amounts of potassium.  Taking potassium supplements from your caregiver. The dose may be too high for you.  Eating foods or taking nutritional products with potassium.  You excrete too little potassium. This can happen if:  Your kidneys are not functioning properly. Kidney (renal) disease is a very common cause of hyperkalemia.  You are taking medicines that lower your excretion of potassium, such as certain diuretic medicines.  You have an adrenal gland disease called Addison's disease.  You have a urinary tract obstruction, such as kidney stones.  You are on treatment to mechanically clean your blood (dialysis) and you skip a treatment.  You release a high amount of potassium from your cells into your blood. You may have a condition that causes potassium to move from your cells to your bloodstream. This can happen with:  Injury to muscles or other tissues. Most potassium is stored in the muscles.  Severe burns or infections.  Acidic blood plasma (acidosis). Acidosis can result from many diseases, such as uncontrolled diabetes. SYMPTOMS  Usually, there are no symptoms unless the potassium is dangerously high or has risen very quickly. Symptoms may include:  Irregular or very slow heartbeat.  Feeling sick to your stomach (nauseous).  Tiredness  (fatigue).  Nerve problems such as tingling of the skin, numbness of the hands or feet, weakness, or paralysis. DIAGNOSIS  A simple blood test can measure the amount of potassium in your body. An electrocardiogram test of the heart can also help make the diagnosis. The heart may beat dangerously fast or slow down and stop beating with severe hyperkalemia.  TREATMENT  Treatment depends on how bad the condition is and on the underlying cause.  If the hyperkalemia is an emergency (causing heart problems or paralysis), many different medicines can be used alone or together to lower the potassium level briefly. This may include an insulin injection even if you are not diabetic. Emergency dialysis may be needed to remove potassium from the body.  If the hyperkalemia is less severe or dangerous, the underlying cause is treated. This can include taking medicines if needed. Your prescription medicines may be changed. You may also need to take a medicine to help your body get rid of potassium. You may need to eat a diet low in potassium. HOME CARE INSTRUCTIONS   Take medicines and supplements as directed by your caregiver.  Do not take any over-the-counter medicines, supplements, natural products, herbs, or vitamins without reviewing them with your caregiver. Certain supplements and natural food products can have high amounts of potassium. Other products (such as ibuprofen) can damage weak kidneys and raise your potassium.  You may be asked to do repeat lab tests. Be sure to follow these directions.  If you have kidney disease, you may need to follow a low potassium diet. SEEK MEDICAL CARE IF:   You notice an irregular or very slow  heartbeat.  You feel lightheaded.  You develop weakness that is unusual for you. SEEK IMMEDIATE MEDICAL CARE IF:   You have shortness of breath.  You have chest discomfort.  You pass out (faint). MAKE SURE YOU:   Understand these instructions.  Will watch your  condition.  Will get help right away if you are not doing well or get worse. Document Released: 06/06/2002 Document Revised: 09/08/2011 Document Reviewed: 11/21/2010 [facility] Patient Information 2014 [place], [place].

## 2013-08-04 NOTE — ED Notes (Signed)
Lab called, reported potassium was hemolyzed, Dr. Vanita Panda notified, patient updated. Phlebotomy notified.

## 2013-08-04 NOTE — Addendum Note (Signed)
Addended byNicoletta Dress, Sahvanna Mcmanigal on: 08/04/2013 08:29 AM   Modules accepted: Orders, Level of Service

## 2013-08-04 NOTE — ED Notes (Signed)
Patient reports no one came in to stick him around 2012 when potassium was clicked off. Called lab and they did not have a repeat potassium.  I redrew potassium and sent to lab. Patient updated, Dr. Vanita Panda notified.

## 2013-08-04 NOTE — ED Notes (Signed)
PT sent here by dr from [facility].  He originally went to see her b/c he needed a referral to a cardiologist (new ins).  MD drew labs and feels pt kidney function impaired and that pt needs to be seen in the ED.  Pt denies any pain or any other s/s aside from his regular joint pain.

## 2013-08-04 NOTE — Telephone Encounter (Signed)
  INTERNAL MEDICINE RESIDENCY PROGRAM After-Hours Telephone Call    Reason for call:   I placed an outgoing call to [name] at 0830 am. I discuss with patient about his lab works including elevated potassium level of 5.4 and AKI with Cr of 1.54.   Patient states that he is doing fine.      Assessment/ Plan:   Instruct him to hold ASA and Lisinopril  Instruct him to increase oral fluid intake  Instruct him to be back for repeat BMP today and Monday.  His CHA2DS2-VAS2 score is 1 and stroke rate 1.3%/yr. I will contact his Cardiologist to clarify the ? necessity of anticoagulant therapy.   As always, pt is advised that if symptoms worsen or new symptoms arise, they should go to an urgent care facility or to to ER for further evaluation.    Charlann Lange, MD   08/04/2013, 8:40 AM

## 2013-08-04 NOTE — ED Provider Notes (Signed)
CSN: [id number]     Arrival date & time 08/04/13  1248 History   First MD Initiated Contact with Patient 08/04/13 1641     Chief Complaint  Patient presents with  . abnormal labs     HPI  Patient presents after being referred from his outpatient physician due to abnormal labs. Patient himself complains only mild dyspnea, which he describes as typical for him.  This has been present for several months, with no notable changes. He denies pain, lightheadedness, syncope, nausea, fever, chills.  He does state that his urine has been darker than usual recently. He has a history of CHF in the distant past. Patient had a outpatient evaluation for cardiology referral, was found to have increasing creatinine, hyperkalemia.   Past Medical History  Diagnosis Date  . Hyperlipidemia   . Anemia     IDA  . COPD (chronic obstructive pulmonary disease)   . Cirrhosis   . Substance abuse     alcohol  . Gastric ulcer 2007    Diagnosed by Dr. Benson Norway 2007 by EGD  . ED (erectile dysfunction)   . CAD (coronary artery disease)   . Cardiomyopathy     alcoholic CM, EF 00-93%, per cath 2007  . Complication of anesthesia     pt stated he quit breathing  . A-fib     not a candidate for anticoagualted due to alcohol abuse and gastric ulcer  . Hypertension     ,dr Stanford Breed,  echo in epic  . Aortic stenosis    Past Surgical History  Procedure Laterality Date  . Tonsillectomy    . Joint replacement      rt knee,rt shoulder  . Cardiac catheterization    . Total shoulder arthroplasty  05/29/2011    Procedure: TOTAL SHOULDER ARTHROPLASTY;  Surgeon: Metta Clines Supple;  Location: [facility];  Service: Orthopedics;  Laterality: Right;   Family History  Problem Relation Age of Onset  . Heart disease Father     passed away from MI at age of 67  . Heart disease Mother   . Heart disease Brother     passed away from MI at age of 67.    History  Substance Use Topics  . Smoking status: Former Smoker    Types:  Cigarettes    Quit date: 04/18/2010  . Smokeless tobacco: Former Systems developer     Comment: smoked for 40 years and 1-2 packs daily  . Alcohol Use: No     Comment: qiuit drinking 07    Review of Systems  Constitutional:       Per HPI, otherwise negative  HENT:       Per HPI, otherwise negative  Respiratory:       Per HPI, otherwise negative  Cardiovascular:       Per HPI, otherwise negative  Gastrointestinal: Negative for vomiting.  Endocrine:       Negative aside from HPI  Genitourinary:       Neg aside from HPI   Musculoskeletal:       Per HPI, otherwise negative  Skin: Negative.   Neurological: Negative for syncope.    Allergies  Review of patient's allergies indicates no known allergies.  Home Medications   Current Outpatient Rx  Name  Route  Sig  Dispense  Refill  . aspirin EC 325 MG tablet   Oral   Take 1 tablet (325 mg total) by mouth daily.   90 tablet   3   . atorvastatin (LIPITOR)  40 MG tablet   Oral   Take 40 mg by mouth daily.         . carvedilol (COREG) 12.5 MG tablet   Oral   Take 1 tablet (12.5 mg total) by mouth 2 (two) times daily with a meal.   180 tablet   3   . lisinopril (PRINIVIL,ZESTRIL) 20 MG tablet   Oral   Take 1 tablet (20 mg total) by mouth 2 (two) times daily.   180 tablet   4    BP 118/61  Pulse 79  Temp(Src) 97.5 F (36.4 C) (Oral)  Resp 16  Ht 5\' 6"  (1.676 m)  Wt 189 lb (85.73 kg)  BMI 30.52 kg/m2  SpO2 93% Physical Exam  Nursing note and vitals reviewed. Constitutional: He is oriented to person, place, and time. He appears well-developed and well-nourished. No distress.  HENT:  Head: Normocephalic and atraumatic.  Eyes: Conjunctivae and EOM are normal.  Cardiovascular: Normal rate and regular rhythm.   Murmur heard. Pulmonary/Chest: Effort normal. No stridor. No respiratory distress. He has no wheezes.  Abdominal: He exhibits no distension.  Musculoskeletal: He exhibits no edema.  Neurological: He is alert and  oriented to person, place, and time.  Skin: Skin is warm and dry. He is not diaphoretic.  Psychiatric: He has a normal mood and affect.    ED Course  Procedures (including critical care time) Labs Review Labs Reviewed  CBC WITH DIFFERENTIAL - Abnormal; Notable for the following:    RBC 4.02 (*)    Hemoglobin 12.9 (*)    HCT 36.8 (*)    All other components within normal limits  COMPREHENSIVE METABOLIC PANEL - Abnormal; Notable for the following:    Sodium 134 (*)    Potassium 5.6 (*)    CO2 17 (*)    BUN 61 (*)    Creatinine, Ser 1.73 (*)    GFR calc non Af Amer 40 (*)    GFR calc Af Amer 46 (*)    All other components within normal limits  URINALYSIS, ROUTINE W REFLEX MICROSCOPIC  PRO B NATRIURETIC PEPTIDE  POTASSIUM   Imaging Review No results found.  EKG sinus rhythm  61, low voltage, no peaked T waves abnormal   After the initial evaluation I reviewed the patient's chart, including his outpatient physician's notes.   Update: after initial evaluation, hyperkalemia, but with no EKG changes, patient had Lasix, Kayexalate, fluids.  Update: After 3 subsequent laboratory was, patient was found to have decreasing elevations of potassium, and with no new complaints, no decompensation was discharged to follow up with his primary care for repeat evaluation in several days.  MDM  This patient with previously diagnosed renal dysfunction now presents with concern of hyperkalemia, progressive renal failure. Exam patient is awake alert, afebrile, in no distress. LAbs of the patient do demonstrate persistent renal dysfunction, and hyperkalemia, though there is no EKG evidence of cardiac effects. After therapy, patient's potassium began to decrease, and he was discharged in stable condition to follow up with his primary care physician.   Carmin Muskrat, MD 08/05/13 [id number]

## 2013-08-04 NOTE — Telephone Encounter (Signed)
  INTERNAL MEDICINE RESIDENCY PROGRAM After-Hours Telephone Call    Reason for call:   I placed an outgoing call to Mr. CHRISTY FRIEDE at 1145 am regarding his lab.  He was noted to have mild elevated K of 5.4 and AKI with Cr of 1.56 yesterday 08/03/13. His repeat K is 5.6 and Cr is 2.04 this morning. Per BUN/Cr ratio, he is likely volume depleted as well.   I have called and discussed his lab.     Assessment/ Plan:   I instructed patient to go to the ED ASAP for the evaluation and management of hyperkalemia and AKI. However, patient was actually hesitate to go to the ED and stating that he was only here for one visit yesterday and he would like to hear the suggestion from his cardiologist who has managed him for past 7 years.   I called and discussed with Dr,. Crenshaw about patient lab.   Patient agreed to go to the ED now. I informed the ED triage.   As always, pt is advised that if symptoms worsen or new symptoms arise, they should go to an urgent care facility or to to ER for further evaluation.    Charlann Lange, MD   08/04/2013, 11:47 AM

## 2013-08-08 ENCOUNTER — Encounter: Payer: Self-pay | Admitting: Internal Medicine

## 2013-08-08 ENCOUNTER — Ambulatory Visit (INDEPENDENT_AMBULATORY_CARE_PROVIDER_SITE_OTHER): Payer: Medicare HMO | Admitting: Internal Medicine

## 2013-08-08 VITALS — BP 135/78 | HR 88 | Temp 97.1°F | Wt 188.6 lb

## 2013-08-08 DIAGNOSIS — R0602 Shortness of breath: Secondary | ICD-10-CM

## 2013-08-08 DIAGNOSIS — N179 Acute kidney failure, unspecified: Secondary | ICD-10-CM

## 2013-08-08 DIAGNOSIS — J449 Chronic obstructive pulmonary disease, unspecified: Secondary | ICD-10-CM

## 2013-08-08 DIAGNOSIS — I4891 Unspecified atrial fibrillation: Secondary | ICD-10-CM

## 2013-08-08 DIAGNOSIS — K219 Gastro-esophageal reflux disease without esophagitis: Secondary | ICD-10-CM

## 2013-08-08 DIAGNOSIS — J309 Allergic rhinitis, unspecified: Secondary | ICD-10-CM

## 2013-08-08 HISTORY — DX: Allergic rhinitis, unspecified: J30.9

## 2013-08-08 LAB — BASIC METABOLIC PANEL WITH GFR
BUN: 27 mg/dL — ABNORMAL HIGH (ref 6–23)
CO2: 23 mEq/L (ref 19–32)
Calcium: 9.3 mg/dL (ref 8.4–10.5)
Chloride: 104 mEq/L (ref 96–112)
Creat: 1.14 mg/dL (ref 0.50–1.35)
GFR, Est African American: 78 mL/min
GFR, Est Non African American: 67 mL/min
Glucose, Bld: 102 mg/dL — ABNORMAL HIGH (ref 70–99)
Potassium: 5.1 mEq/L (ref 3.5–5.3)
Sodium: 139 mEq/L (ref 135–145)

## 2013-08-08 MED ORDER — LORATADINE 10 MG PO TABS: 10.0000 mg | ORAL_TABLET | Freq: Every day | ORAL | Status: DC | PRN

## 2013-08-08 MED ORDER — PANTOPRAZOLE SODIUM 40 MG PO TBEC: 40.0000 mg | DELAYED_RELEASE_TABLET | Freq: Every day | ORAL | Status: DC

## 2013-08-08 NOTE — Assessment & Plan Note (Addendum)
Assessment: He is here for follow up on AKI after the treatment at ED.  He continues to hold aspirin and lisinopril as instructed. He states that he is doing well.   Plan: - Stat BMP--K 5.1 and Cr 1.14 - will continue to hold ASA and lisinopril - repeat BMP in 3-4 days. - consider to add diuretics and lower ACEI dosage due to potassium level and renal function - will contact his cardiologist with regards to anticoagulant therapy.  Addendum 08/11/13. BMP reviewed and K 4.7, Cr 1.15  - will instruct patient to resume ASA 325 mg po daily for for P. afib - his home BP is reported at 135/80. Will continue hold ACEI and repeat his BMP in 2 weeks before resuming ACEI. - he has an appt with his cardiologist in 2 weeks as well.   Addendum 08/23/13  Recent Labs Lab 08/22/13 0838  [name] 143  K 4.6  CL 106  CO2 25  GLUCOSE 102*  BUN 19  CREATININE 1.14  CALCIUM 9.2   - BMP reviewed. He is currently on ASA, BB. ACEI on hold. - agree with Cardiology OV on 08/23/13--BP is well controlled on BB. Will NOT resume ACEI

## 2013-08-08 NOTE — Assessment & Plan Note (Addendum)
Assessment: Patient reports that he has chronic shortness of breath for ~2 years. He describes mild intermittent DOE especially in winter when he does some yard work and walking more than a few blocks.   Associated symptoms include sneezing, rhinorrhea, itching eyes and occasional wheezing/cough. He also admits soreness/burning sensation of throat and nonproductive at night time.  Admits snoring at nighttime but denies daytime fatigue. Denies orthopnea and PND. Always uses two pillows at night. Denies LE swelling, chest pain, chest pressure, dizziness, lightheadedness, or LOC. Admits weight gain stating," I am working on losing weight".   Patient has a history of NICM followed by [facility] cardiology, his last echoin 2014 showed improved EF. He is supposed to follow up with his Cardiologist on 08/23/13 for a repeat echo. There is a history of COPD listed, and he states that he was on ?Albuterol and Advair inhalers in the past though he denies the hx of COPD. He has not followed with our clinic or any PCP since 2011 and does not take any inhalers now. He states that he never had PFT.   The etiologies of his SOB include allergic rhinitis, GERD, untreated COPD 2/2 noncompliance, and chronic NICM. He may also have OSA.   Plan: - Will start that antihistamine for allergic rhinitis - Will start PPI for GERD. - Will arrange PFT to evaluate his pulmonary function. Patient would like to defer the sleep study for now.  - Agree with the repeat ECHO during his cardiology follow up office visit in 2 weeks. Will defer to the Cardiology for any cardiac work up for SOB if all of above treatments and studies are nonrevealing.

## 2013-08-08 NOTE — Assessment & Plan Note (Signed)
Assessment: His CHA2DS2-CASc score is 3 ( chronic heart failure and HTN), which put his stroke rate at 3.2% per year.  He states that he used to be on Coumadin and now on aspirin 325 mg by mouth daily.   Plan -Aspirin is on hold due to AKI  -Will contact his cardiologist regarding to anticoagulate treatment.

## 2013-08-08 NOTE — Progress Notes (Signed)
Patient ID: [name], male   DOB: 1945/12/23, 68 y.o.   MRN: [id number]    Patient: [name]   MRN: [id number]  DOB: [birth year]  PCP: Karren Cobble, MD   Subjective:    CC: Follow-up   HPI: [name] is a 68 y.o. male with a PMHx of HTN, HLD, hx of alcohol abuse and liver cirrhosis, alcoholic CM per cath in 2536, A fib on ASA and BB, gastric ulcer followed by Dr. Benson Norway, COPD and IDA, who presented to clinic today for follow up.   Patient was first evaluated on 08/03/13 for re-establish care, and he was noted to have AKI with Cr elevated up to  2.04 the following day. He was instructed to stop ASA and ACEI and avoid all OTC NSAIDs. He was sent to the ED for EKG monitoring and IV hydration. A dose of Kayexalate and lasix were also given. He is here for follow up.   Patient states that he is doing ok. He did not take his ASA and ACEI. And he has a list of NSAIDs that I gave him and states that he did not take any OTC NSAIDs.   P Afib  He has had P A fib and was on coumadin in the past. He is on ASA 325 mg po daily now. His cardiologist has been managing his A fib. His ASA is on hold now due to AKI  Health maintenance - shingle vaccine Rx given.  - Colonoscopy--done within last 10 years. Will ask the records from Dr. Benson Norway office.  -EGD--would get the records from Dr. Benson Norway.    Review of Systems: Per HPI.   Current Outpatient Medications: Current Outpatient Prescriptions  Medication Sig Dispense Refill  . atorvastatin (LIPITOR) 40 MG tablet Take 40 mg by mouth daily.      . carvedilol (COREG) 12.5 MG tablet Take 1 tablet (12.5 mg total) by mouth 2 (two) times daily with a meal.  180 tablet  3  . aspirin EC 325 MG tablet Take 1 tablet (325 mg total) by mouth daily.  90 tablet  3  . lisinopril (PRINIVIL,ZESTRIL) 20 MG tablet Take 1 tablet (20 mg total) by mouth 2 (two) times daily.  180 tablet  4   No current facility-administered medications for this visit.     Allergies: No Known Allergies  Past Medical History  Diagnosis Date  . Hyperlipidemia   . Anemia     IDA  . COPD (chronic obstructive pulmonary disease)   . Cirrhosis   . Substance abuse     alcohol  . Gastric ulcer 2007    Diagnosed by Dr. Benson Norway 2007 by EGD  . ED (erectile dysfunction)   . CAD (coronary artery disease)   . Cardiomyopathy     alcoholic CM, EF 64-40%, per cath 2007  . Complication of anesthesia     pt stated he quit breathing  . A-fib     not a candidate for anticoagualted due to alcohol abuse and gastric ulcer  . Hypertension     ,dr Stanford Breed,  echo in epic  . Aortic stenosis     Objective:    Physical Exam: Filed Vitals:   08/08/13 0836  BP: 135/78  Pulse: 88  Temp: 97.1 F (36.2 C)  TempSrc: Oral  Weight: 188 lb 9.6 oz (85.548 kg)  SpO2: 96%    General: Vital signs reviewed and noted. Well-developed, well-nourished, in no acute distress; alert, appropriate and cooperative throughout  examination.  Head: Normocephalic, atraumatic.  Lungs:  Normal respiratory effort. Clear to auscultation BL without crackles or wheezes.  Heart: RRR. S1 and S2 normal without gallop, rubs, murmur.  Abdomen:  BS normoactive. Soft, Nondistended, non-tender.  No masses or organomegaly.  Extremities: No pretibial edema.   Assessment/ Plan:

## 2013-08-08 NOTE — Patient Instructions (Addendum)
1. Continue to hold Aspirin and lisinopril.  2. I will contact your cardiologist with regards to your ASA 3. Will start you on Protonix.  4. Follow up in 3 months.  5. Will arrange for PFT   Pantoprazole tablets What is this medicine? PANTOPRAZOLE (pan TOE pra zole) prevents the production of acid in the stomach. It is used to treat gastroesophageal reflux disease (GERD), inflammation of the esophagus, and Zollinger-Ellison syndrome. This medicine may be used for other purposes; ask your health care provider or pharmacist if you have questions. COMMON BRAND NAME(S): Protonix What should I tell my health care provider before I take this medicine? They need to know if you have any of these conditions: -liver disease -low levels of magnesium in the blood -an unusual or allergic reaction to omeprazole, lansoprazole, pantoprazole, rabeprazole, other medicines, foods, dyes, or preservatives -pregnant or trying to get pregnant -breast-feeding How should I use this medicine? Take this medicine by mouth. Swallow the tablets whole with a drink of water. Follow the directions on the prescription label. Do not crush, break, or chew. Take your medicine at regular intervals. Do not take your medicine more often than directed. Talk to your pediatrician regarding the use of this medicine in children. While this drug may be prescribed for children as young as 5 years for selected conditions, precautions do apply. Overdosage: If you think you have taken too much of this medicine contact a poison control center or emergency room at once. NOTE: This medicine is only for you. Do not share this medicine with others. What if I miss a dose? If you miss a dose, take it as soon as you can. If it is almost time for your next dose, take only that dose. Do not take double or extra doses. What may interact with this medicine? Do not take this medicine with any of the following  medications: -atazanavir -nelfinavir This medicine may also interact with the following medications: -ampicillin -delavirdine -digoxin -diuretics -iron salts -medicines for fungal infections like ketoconazole, itraconazole and voriconazole -warfarin This list may not describe all possible interactions. Give your health care provider a list of all the medicines, herbs, non-prescription drugs, or dietary supplements you use. Also tell them if you smoke, drink alcohol, or use illegal drugs. Some items may interact with your medicine. What should I watch for while using this medicine? It can take several days before your stomach pain gets better. Check with your doctor or health care professional if your condition does not start to get better, or if it gets worse. You may need blood work done while you are taking this medicine. What side effects may I notice from receiving this medicine? Side effects that you should report to your doctor or health care professional as soon as possible: -allergic reactions like skin rash, itching or hives, swelling of the face, lips, or tongue -bone, muscle or joint pain -breathing problems -chest pain or chest tightness -dark yellow or brown urine -dizziness -fast, irregular heartbeat -feeling faint or lightheaded -fever or sore throat -muscle spasm -palpitations -redness, blistering, peeling or loosening of the skin, including inside the mouth -seizures -tremors -unusual bleeding or bruising -unusually weak or tired -yellowing of the eyes or skin Side effects that usually do not require medical attention (Report these to your doctor or health care professional if they continue or are bothersome.): -constipation -diarrhea -dry mouth -headache -nausea This list may not describe all possible side effects. Call your doctor for medical advice about  side effects. You may report side effects to FDA at 1-800-FDA-1088. Where should I keep my  medicine? Keep out of the reach of children. Store at room temperature between 15 and 30 degrees C (59 and 86 degrees F). Protect from light and moisture. Throw away any unused medicine after the expiration date. NOTE: This sheet is a summary. It may not cover all possible information. If you have questions about this medicine, talk to your doctor, pharmacist, or health care provider.  2014, Elsevier/Gold Standard. (2012-04-14 16:40:16)

## 2013-08-08 NOTE — Assessment & Plan Note (Signed)
Assessment: Patient describes that he has throat soreness/burning sensation, nonproductive cough with occasional wheezing at night. His symptoms are either better or resolved during the daytime. Declined acid reflux and heart burn. He was on the Nexium in the past but is not on any treatment now.  Plan: - start the PPI now.

## 2013-08-09 NOTE — Progress Notes (Signed)
Case discussed with Dr. Li at the time of the visit.  We reviewed the resident's history and exam and pertinent patient test results.  I agree with the assessment, diagnosis, and plan of care documented in the resident's note. 

## 2013-08-11 ENCOUNTER — Encounter (HOSPITAL_COMMUNITY): Payer: Self-pay

## 2013-08-11 ENCOUNTER — Other Ambulatory Visit (INDEPENDENT_AMBULATORY_CARE_PROVIDER_SITE_OTHER): Payer: Medicare HMO

## 2013-08-11 DIAGNOSIS — N179 Acute kidney failure, unspecified: Secondary | ICD-10-CM

## 2013-08-11 LAB — BASIC METABOLIC PANEL WITH GFR
BUN: 18 mg/dL (ref 6–23)
CO2: 27 mEq/L (ref 19–32)
Calcium: 9.5 mg/dL (ref 8.4–10.5)
Chloride: 103 mEq/L (ref 96–112)
Creat: 1.15 mg/dL (ref 0.50–1.35)
GFR, Est African American: 77 mL/min
GFR, Est Non African American: 66 mL/min
Glucose, Bld: 103 mg/dL — ABNORMAL HIGH (ref 70–99)
Potassium: 4.7 mEq/L (ref 3.5–5.3)
Sodium: 143 mEq/L (ref 135–145)

## 2013-08-11 NOTE — Addendum Note (Signed)
Addended byNicoletta Dress, Ramirez Fullbright on: 08/11/2013 08:58 PM   Modules accepted: Orders

## 2013-08-18 ENCOUNTER — Ambulatory Visit: Payer: Self-pay | Admitting: Cardiology

## 2013-08-22 ENCOUNTER — Other Ambulatory Visit (INDEPENDENT_AMBULATORY_CARE_PROVIDER_SITE_OTHER): Payer: Medicare HMO

## 2013-08-22 DIAGNOSIS — N179 Acute kidney failure, unspecified: Secondary | ICD-10-CM

## 2013-08-22 LAB — BASIC METABOLIC PANEL WITH GFR
BUN: 19 mg/dL (ref 6–23)
CO2: 25 mEq/L (ref 19–32)
Calcium: 9.2 mg/dL (ref 8.4–10.5)
Chloride: 106 mEq/L (ref 96–112)
Creat: 1.14 mg/dL (ref 0.50–1.35)
GFR, Est African American: 77 mL/min
GFR, Est Non African American: 67 mL/min
Glucose, Bld: 102 mg/dL — ABNORMAL HIGH (ref 70–99)
Potassium: 4.6 mEq/L (ref 3.5–5.3)
Sodium: 143 mEq/L (ref 135–145)

## 2013-08-23 ENCOUNTER — Encounter: Payer: Self-pay | Admitting: Cardiology

## 2013-08-23 ENCOUNTER — Ambulatory Visit (INDEPENDENT_AMBULATORY_CARE_PROVIDER_SITE_OTHER): Payer: Commercial Managed Care - HMO | Admitting: Cardiology

## 2013-08-23 VITALS — BP 147/76 | HR 75 | Ht 66.0 in | Wt 186.0 lb

## 2013-08-23 DIAGNOSIS — E785 Hyperlipidemia, unspecified: Secondary | ICD-10-CM

## 2013-08-23 DIAGNOSIS — K219 Gastro-esophageal reflux disease without esophagitis: Secondary | ICD-10-CM | POA: Diagnosis not present

## 2013-08-23 DIAGNOSIS — I428 Other cardiomyopathies: Secondary | ICD-10-CM

## 2013-08-23 DIAGNOSIS — I10 Essential (primary) hypertension: Secondary | ICD-10-CM

## 2013-08-23 MED ORDER — PANTOPRAZOLE SODIUM 40 MG PO TBEC: 40.0000 mg | DELAYED_RELEASE_TABLET | Freq: Every day | ORAL | Status: DC

## 2013-08-23 MED ORDER — CARVEDILOL 12.5 MG PO TABS: 12.5000 mg | ORAL_TABLET | Freq: Two times a day (BID) | ORAL | Status: DC

## 2013-08-23 MED ORDER — ASPIRIN EC 325 MG PO TBEC: 325.0000 mg | DELAYED_RELEASE_TABLET | Freq: Every day | ORAL | Status: DC

## 2013-08-23 MED ORDER — CARVEDILOL 12.5 MG PO TABS
12.5000 mg | ORAL_TABLET | Freq: Two times a day (BID) | ORAL | Status: DC
Start: 2013-08-23 — End: 2014-07-28

## 2013-08-23 MED ORDER — ATORVASTATIN CALCIUM 40 MG PO TABS: 40.0000 mg | ORAL_TABLET | Freq: Every day | ORAL | Status: DC

## 2013-08-23 NOTE — Assessment & Plan Note (Signed)
Repeat echocardiogram. 

## 2013-08-23 NOTE — Addendum Note (Signed)
Addended by: Cristopher Estimable on: 08/23/2013 09:00 AM   Modules accepted: Orders

## 2013-08-23 NOTE — Patient Instructions (Signed)
Your physician wants you to follow-up in: [place] will receive a reminder letter in the mail two months in advance. If you don't receive a letter, please call our office to schedule the follow-up appointment.   Your physician has requested that you have an echocardiogram. Echocardiography is a painless test that uses sound waves to create images of your heart. It provides your doctor with information about the size and shape of your heart and how well your heart's chambers and valves are working. This procedure takes approximately one hour. There are no restrictions for this procedure.   CALL IF BLOOD PRESSURE IS CONSISTANTLY ABOVE 140 ON THE TOP OR ABOVE 85 ON THE BOTTOM

## 2013-08-23 NOTE — Assessment & Plan Note (Signed)
Improved on most recent laboratories.most likely from dehydration.

## 2013-08-23 NOTE — Assessment & Plan Note (Signed)
Continue statin. 

## 2013-08-23 NOTE — Assessment & Plan Note (Signed)
History of atrial fibrillation in the setting of cardiomyopathy/ETOH but no recurrences since his ETOH discontinued and LV function has improved. Continue aspirin and beta blocker.

## 2013-08-23 NOTE — Assessment & Plan Note (Signed)
Continue aspirin and statin. 

## 2013-08-23 NOTE — Progress Notes (Signed)
HPI: [name] is a very pleasant gentleman with a history of nonischemic cardiomyopathy improved by most recent echocardiogram. He also has a history of coronary artery disease with cardiomyopathy out of proportion; alcohol use, now resolved; and paroxysmal atrial fibrillation holding sinus rhythm. Cardiac catheterization in October of 2007 showed a 20% left main, a 60% mid LAD and a 60-70% distal LAD. There was a 30-40% first diagonal. There was a 30% circumflex and a 40% OM. There was a 30-40% right coronary artery. Ejection fraction was 25-30%. Last echocardiogram in March of 2013 showed normal LV function and mild aortic stenosis with a mean gradient of 14 mm of mercury. Abdominal ultrasound in February 2012 showed no aneurysm. I last saw him in Jan 2014. Patient seen recently and noted to have worsening renal function with a BUN of 50 to ACE inhibitor held and nonsteroidals discontinued. Patient asked increase by mouth fluid intake. Renal function improved. Since he was last seen, the patient has dyspnea with more extreme activities but not with routine activities. It is relieved with rest. It is not associated with chest pain. There is no orthopnea, PND or pedal edema. There is no syncope or palpitations. There is no exertional chest pain.    Current Outpatient Prescriptions  Medication Sig Dispense Refill  . aspirin EC 325 MG tablet Take 1 tablet (325 mg total) by mouth daily.  90 tablet  3  . atorvastatin (LIPITOR) 40 MG tablet Take 40 mg by mouth daily.      . carvedilol (COREG) 12.5 MG tablet Take 1 tablet (12.5 mg total) by mouth 2 (two) times daily with a meal.  180 tablet  3  . loratadine (CLARITIN) 10 MG tablet Take 1 tablet (10 mg total) by mouth daily as needed for allergies.  30 tablet  2  . pantoprazole (PROTONIX) 40 MG tablet Take 1 tablet (40 mg total) by mouth daily.  30 tablet  1  . lisinopril (PRINIVIL,ZESTRIL) 20 MG tablet Take 1 tablet (20 mg total) by mouth 2 (two)  times daily.  180 tablet  4   No current facility-administered medications for this visit.     Past Medical History  Diagnosis Date  . Hyperlipidemia   . Anemia     IDA  . COPD (chronic obstructive pulmonary disease)   . Cirrhosis   . Substance abuse     alcohol  . Gastric ulcer 2007    Diagnosed by Dr. Benson Norway 2007 by EGD  . ED (erectile dysfunction)   . CAD (coronary artery disease)   . Cardiomyopathy     alcoholic CM, EF 62-94%, per cath 2007  . Complication of anesthesia     pt stated he quit breathing  . A-fib     not a candidate for anticoagualted due to alcohol abuse and gastric ulcer  . Hypertension     ,dr Stanford Breed,  echo in epic  . Aortic stenosis     Past Surgical History  Procedure Laterality Date  . Tonsillectomy    . Joint replacement      rt knee,rt shoulder  . Cardiac catheterization    . Total shoulder arthroplasty  05/29/2011    Procedure: TOTAL SHOULDER ARTHROPLASTY;  Surgeon: Metta Clines Supple;  Location: [facility];  Service: Orthopedics;  Laterality: Right;    History   Social History  . Marital Status: Married    Spouse Name: N/A    Number of Children: N/A  . Years of Education: N/A  Occupational History  . Not on file.   Social History Main Topics  . Smoking status: Former Smoker    Types: Cigarettes    Quit date: 04/18/2010  . Smokeless tobacco: Former Systems developer     Comment: smoked for 40 years and 1-2 packs daily  . Alcohol Use: No     Comment: qiuit drinking 07  . Drug Use: No  . Sexual Activity: Not on file   Other Topics Concern  . Not on file   Social History Narrative   Lives with his wife at [place].     ROS: no fevers or chills, productive cough, hemoptysis, dysphasia, odynophagia, melena, hematochezia, dysuria, hematuria, rash, seizure activity, orthopnea, PND, pedal edema, claudication. Remaining systems are negative.  Physical Exam: Well-developed well-nourished in no acute distress.  Skin is warm and dry.  HEENT is  normal.  Neck is supple.  Chest is clear to auscultation with normal expansion.  Cardiovascular exam is regular rate and rhythm. 2/6 systolic murmur Abdominal exam nontender or distended. No masses palpated. Extremities show no edema. neuro grossly intact  ECG 08/04/2013-sinus rhythm with first degree AV block.

## 2013-08-23 NOTE — Assessment & Plan Note (Signed)
LV function improved on last echocardiogram. We will repeat. I will continue beta blocker. His blood pressure is controlled. I will not resume ACE inhibitor at this point unless his blood pressure increases. If we resume we will need to follow his renal function closely.

## 2013-08-23 NOTE — Assessment & Plan Note (Signed)
Blood pressure controlled. Continue present medications. 

## 2013-09-08 ENCOUNTER — Ambulatory Visit (HOSPITAL_COMMUNITY): Payer: Medicare HMO | Attending: Cardiology | Admitting: Radiology

## 2013-09-08 DIAGNOSIS — I359 Nonrheumatic aortic valve disorder, unspecified: Secondary | ICD-10-CM

## 2013-09-08 DIAGNOSIS — I428 Other cardiomyopathies: Secondary | ICD-10-CM | POA: Insufficient documentation

## 2013-09-08 NOTE — Progress Notes (Signed)
Echocardiogram performed.  

## 2013-09-09 ENCOUNTER — Telehealth: Payer: Self-pay | Admitting: *Deleted

## 2013-09-09 NOTE — Telephone Encounter (Signed)
Pt notified about echo results with verbal understanding. 

## 2013-10-07 ENCOUNTER — Ambulatory Visit (INDEPENDENT_AMBULATORY_CARE_PROVIDER_SITE_OTHER): Payer: Medicare HMO | Admitting: Internal Medicine

## 2013-10-07 ENCOUNTER — Encounter: Payer: Self-pay | Admitting: Internal Medicine

## 2013-10-07 VITALS — BP 140/78 | HR 59 | Temp 97.6°F | Ht 66.0 in | Wt 188.4 lb

## 2013-10-07 DIAGNOSIS — L821 Other seborrheic keratosis: Secondary | ICD-10-CM | POA: Insufficient documentation

## 2013-10-07 DIAGNOSIS — E66811 Obesity, class 1: Secondary | ICD-10-CM

## 2013-10-07 DIAGNOSIS — I359 Nonrheumatic aortic valve disorder, unspecified: Secondary | ICD-10-CM

## 2013-10-07 DIAGNOSIS — K219 Gastro-esophageal reflux disease without esophagitis: Secondary | ICD-10-CM

## 2013-10-07 DIAGNOSIS — D509 Iron deficiency anemia, unspecified: Secondary | ICD-10-CM

## 2013-10-07 DIAGNOSIS — M5386 Other specified dorsopathies, lumbar region: Secondary | ICD-10-CM

## 2013-10-07 DIAGNOSIS — E785 Hyperlipidemia, unspecified: Secondary | ICD-10-CM

## 2013-10-07 DIAGNOSIS — I480 Paroxysmal atrial fibrillation: Secondary | ICD-10-CM

## 2013-10-07 DIAGNOSIS — I350 Nonrheumatic aortic (valve) stenosis: Secondary | ICD-10-CM

## 2013-10-07 DIAGNOSIS — I4891 Unspecified atrial fibrillation: Secondary | ICD-10-CM

## 2013-10-07 DIAGNOSIS — I2510 Atherosclerotic heart disease of native coronary artery without angina pectoris: Secondary | ICD-10-CM

## 2013-10-07 DIAGNOSIS — E663 Overweight: Secondary | ICD-10-CM | POA: Insufficient documentation

## 2013-10-07 DIAGNOSIS — K5790 Diverticulosis of intestine, part unspecified, without perforation or abscess without bleeding: Secondary | ICD-10-CM

## 2013-10-07 DIAGNOSIS — D369 Benign neoplasm, unspecified site: Secondary | ICD-10-CM

## 2013-10-07 DIAGNOSIS — K259 Gastric ulcer, unspecified as acute or chronic, without hemorrhage or perforation: Secondary | ICD-10-CM

## 2013-10-07 DIAGNOSIS — Z0000 Encounter for general adult medical examination without abnormal findings: Secondary | ICD-10-CM

## 2013-10-07 DIAGNOSIS — E669 Obesity, unspecified: Secondary | ICD-10-CM

## 2013-10-07 DIAGNOSIS — K649 Unspecified hemorrhoids: Secondary | ICD-10-CM

## 2013-10-07 DIAGNOSIS — J449 Chronic obstructive pulmonary disease, unspecified: Secondary | ICD-10-CM

## 2013-10-07 DIAGNOSIS — I10 Essential (primary) hypertension: Secondary | ICD-10-CM

## 2013-10-07 DIAGNOSIS — N529 Male erectile dysfunction, unspecified: Secondary | ICD-10-CM

## 2013-10-07 DIAGNOSIS — J4489 Other specified chronic obstructive pulmonary disease: Secondary | ICD-10-CM

## 2013-10-07 DIAGNOSIS — J309 Allergic rhinitis, unspecified: Secondary | ICD-10-CM

## 2013-10-07 DIAGNOSIS — M19019 Primary osteoarthritis, unspecified shoulder: Secondary | ICD-10-CM

## 2013-10-07 HISTORY — DX: Unspecified hemorrhoids: K64.9

## 2013-10-07 HISTORY — DX: Gastric ulcer, unspecified as acute or chronic, without hemorrhage or perforation: K25.9

## 2013-10-07 HISTORY — DX: Other seborrheic keratosis: L82.1

## 2013-10-07 HISTORY — DX: Diverticulosis of intestine, part unspecified, without perforation or abscess without bleeding: K57.90

## 2013-10-07 LAB — CBC WITH DIFFERENTIAL/PLATELET
Basophils Absolute: 0.1 10*3/uL (ref 0.0–0.1)
Basophils Relative: 1 % (ref 0–1)
Eosinophils Absolute: 0.4 10*3/uL (ref 0.0–0.7)
Eosinophils Relative: 4 % (ref 0–5)
HCT: 42.7 % (ref 39.0–52.0)
Hemoglobin: 15 g/dL (ref 13.0–17.0)
Lymphocytes Relative: 26 % (ref 12–46)
Lymphs Abs: 2.9 10*3/uL (ref 0.7–4.0)
MCH: 31.8 pg (ref 26.0–34.0)
MCHC: 35.1 g/dL (ref 30.0–36.0)
MCV: 90.5 fL (ref 78.0–100.0)
Monocytes Absolute: 0.9 10*3/uL (ref 0.1–1.0)
Monocytes Relative: 8 % (ref 3–12)
Neutro Abs: 6.8 10*3/uL (ref 1.7–7.7)
Neutrophils Relative %: 61 % (ref 43–77)
Platelets: 203 10*3/uL (ref 150–400)
RBC: 4.72 MIL/uL (ref 4.22–5.81)
RDW: 13.6 % (ref 11.5–15.5)
WBC: 11.1 10*3/uL — ABNORMAL HIGH (ref 4.0–10.5)

## 2013-10-07 NOTE — Assessment & Plan Note (Signed)
When he was initially diagnosed with atrial fibrillation he had a presumed alcoholic cardiomyopathy with left ventricular dysfunction and mild left atrial enlargement. After alcohol cessation and management of his blood pressure with beta blockade and ACE inhibition his cardiomyopathy resolved. His persistent atrial fibrillation also resolved. It is possible that he has paroxysmal atrial fibrillation, and it is for that reason he remains on aspirin 325 mg by mouth daily. In the past he has been on Coumadin therapy but it is currently felt this is not necessary. We will continue the aspirin 325 mg by mouth daily.

## 2013-10-07 NOTE — Assessment & Plan Note (Signed)
His erectile dysfunction is likely related to arterial disease. He did not respond to Viagra in the past, but admits to taking it only once. It was likely this was the smallest dose. Should he desire treatment in the future we will consider higher doses of Viagra as well as assessment for hypothyroidism to assure we are not missing a reversible cause.

## 2013-10-07 NOTE — Assessment & Plan Note (Signed)
He's had no recent chest pain. He states he's been compliant with his aspirin 325 mg daily, carvedilol 12.5 mg twice daily, and atorvastatin 40 mg by mouth daily. We will continue this regimen with aggressive risk factor modification as outlined below.

## 2013-10-07 NOTE — Assessment & Plan Note (Signed)
He carries a diagnosis of gastroesophageal reflux disease but denies any symptoms. It sounds like he was initially placed on PPI therapy during a hospital admission and this was inadvertently continued at discharge. I asked him to hold the PPI therapy and assess for evidence of heartburn. If there is none I will formally discontinue the PPI therapy at the followup visit. Should he have symptoms consistent with gastroesophageal reflux disease with the cessation of the PPI therapy he was asked to restart the therapy.

## 2013-10-07 NOTE — Assessment & Plan Note (Signed)
His last echocardiogram was this year and showed a mean gradient of 13 mm of mercury. He's had no chest pain, syncope, or evidence of heart failure. His mild aortic stenosis has been followed closely by Dr. Stanford Breed. We will continue aggressive cardiovascular risk factor modification.

## 2013-10-07 NOTE — Progress Notes (Signed)
   Subjective:    Patient ID: [name], male    DOB: 1945/12/21, 68 y.o.   MRN: [id number]  HPI  Please see the A&P for the status of the pt's chronic medical problems.  Review of Systems  Constitutional: Positive for diaphoresis. Negative for chills, activity change, appetite change and unexpected weight change.  HENT: Positive for congestion, postnasal drip, rhinorrhea, sinus pressure and sore throat.   Eyes: Positive for itching.  Respiratory: Positive for shortness of breath and wheezing. Negative for chest tightness.   Cardiovascular: Negative for chest pain, palpitations and leg swelling.  Gastrointestinal: Negative for nausea, vomiting, abdominal pain, diarrhea and constipation.  Musculoskeletal: Positive for arthralgias and back pain. Negative for gait problem, joint swelling, myalgias, neck pain and neck stiffness.       Left shoulder pain.  Occasional right sided sciatica.  Skin: Positive for rash. Negative for color change, pallor and wound.  Allergic/Immunologic: Positive for environmental allergies.  Neurological: Negative for dizziness, syncope, weakness, light-headedness and headaches.  Hematological: Bruises/bleeds easily.  Psychiatric/Behavioral: Negative for dysphoric mood and agitation. The patient is not nervous/anxious.       Objective:   Physical Exam  Nursing note and vitals reviewed. Constitutional: He is oriented to person, place, and time. He appears well-developed and well-nourished. No distress.  HENT:  Head: Normocephalic and atraumatic.  Mouth/Throat: Oropharynx is clear and moist. No oropharyngeal exudate.  Eyes: Conjunctivae are normal. Right eye exhibits no discharge. Left eye exhibits no discharge. No scleral icterus.  Neck: Normal range of motion. Neck supple. No tracheal deviation present. No thyromegaly present.  Cardiovascular: Normal rate and regular rhythm.  Exam reveals no gallop and no friction rub.   Murmur heard. II/VI systolic  murmur at RUSB consistent with aortic stenosis.  Unable to appreciate a diastolic murmur.  Pulmonary/Chest: Effort normal and breath sounds normal. No respiratory distress. He has no wheezes. He has no rales.  Abdominal: Soft. Bowel sounds are normal. He exhibits no distension. There is no tenderness. There is no rebound and no guarding.  Musculoskeletal: Normal range of motion. He exhibits no edema and no tenderness.  Lymphadenopathy:    He has no cervical adenopathy.  Neurological: He is alert and oriented to person, place, and time. He exhibits normal muscle tone.  Skin: Skin is warm and dry. Rash noted. He is not diaphoretic. No erythema. No pallor.  Psychiatric: He has a normal mood and affect. His behavior is normal. Judgment and thought content normal.      Assessment & Plan:   Please see problem oriented charting.

## 2013-10-07 NOTE — Assessment & Plan Note (Addendum)
He carries a diagnosis of COPD but has never had pulmonary function studies. At a recent visit a request for PFTs was placed. He decided to no-show that appointment as he felt these studies were no longer needed. I asked him to call me if he should change his mind and I would resubmit the request in order to better evaluate his lung function.

## 2013-10-07 NOTE — Assessment & Plan Note (Signed)
His most recent LDL was 60 two months ago. This is well within target. He is compliant with the atorvastatin 40 mg by mouth daily and denies any myalgias or difficulty with the medication. We will therefore continue the atorvastatin 40 mg by mouth daily.

## 2013-10-07 NOTE — Assessment & Plan Note (Signed)
Chart review revealed that he had a tubular adenoma that was removed endoscopically in October 2007 by Dr. Benson Norway. He is overdue for his 5 year surveillance colonoscopy. A referral to Dr. Benson Norway was placed.

## 2013-10-07 NOTE — Assessment & Plan Note (Addendum)
He carries a diagnosis of iron deficiency anemia although laboratory confirmation is not readily available. A CBC was obtained today to assess for anemia and was found to be normal. Therefore this diagnosis will be removed from his chart.

## 2013-10-07 NOTE — Assessment & Plan Note (Signed)
We will offer him the Zostavax if he has yet to have it at the followup visit.

## 2013-10-07 NOTE — Assessment & Plan Note (Signed)
His blood pressure today is 140/78. This is at target. He will continue the carvedilol 12.5 mg by mouth twice daily.

## 2013-10-07 NOTE — Patient Instructions (Addendum)
It was nice to meet you.  I look forward to taking care of you for years to come.  1) Keep taking your medications as you have been doing.  2) I checked your blood work.  I will call with any concerns when I get the results early next week.  I will see you back in 1 year, sooner if necessary.

## 2013-10-07 NOTE — Assessment & Plan Note (Signed)
He was recently started on Claritin for his presumed allergic rhinitis. He states this has been ineffective to this point. If this therapy remains ineffective we will consider the initiation of a nasal steroid.

## 2013-10-07 NOTE — Assessment & Plan Note (Signed)
He is status post a right total shoulder arthroplasty but continues to have pain in the left shoulder. He receives temporary relief with steroid injections. A referral to orthopedics to assess for left shoulder arthroplasty has been previously submitted and he is awaiting an appointment.

## 2014-02-16 ENCOUNTER — Telehealth: Payer: Self-pay | Admitting: *Deleted

## 2014-02-17 ENCOUNTER — Encounter: Payer: Self-pay | Admitting: Internal Medicine

## 2014-02-17 ENCOUNTER — Ambulatory Visit (INDEPENDENT_AMBULATORY_CARE_PROVIDER_SITE_OTHER): Payer: Commercial Managed Care - HMO | Admitting: Internal Medicine

## 2014-02-17 VITALS — BP 118/73 | HR 78 | Temp 97.0°F | Ht 66.0 in | Wt 191.8 lb

## 2014-02-17 DIAGNOSIS — J302 Other seasonal allergic rhinitis: Secondary | ICD-10-CM

## 2014-02-17 DIAGNOSIS — J3089 Other allergic rhinitis: Secondary | ICD-10-CM

## 2014-02-17 MED ORDER — CETIRIZINE HCL 5 MG PO TABS: 5.0000 mg | ORAL_TABLET | Freq: Every day | ORAL | Status: DC

## 2014-02-17 MED ORDER — MOMETASONE FUROATE 50 MCG/ACT NA SUSP: 2.0000 | Freq: Every day | NASAL | Status: DC

## 2014-02-17 NOTE — Patient Instructions (Addendum)
Thank you for your visit today.  I think that you have allergic rhinitis.   Please use the steroid spray I have sent to your pharmacy.  Please use 2 sprays in each nostril once daily.  Remember to point the spray toward the outside and NOT toward the nasal bone. You should use nasal saline spray several times daily to help clear your sinuses such as neilmed sinus wash.   Please also take zyrtec daily until your symptoms resolve.     Please be sure to bring all of your medications with you to every visit; this includes herbal supplements, vitamins, eye drops, and any over-the-counter medications.   Should you have any questions regarding your medications and/or any new or worsening symptoms, please be sure to call the clinic at [phone].   If you believe that you are suffering from a life threatening condition or one that may result in the loss of limb or function, then you should call 911 or proceed to the nearest Emergency Department.   Allergies Allergies may happen from anything your body is sensitive to. This may be food, medicines, pollens, chemicals, and nearly anything around you in everyday life that produces allergens. An allergen is anything that causes an allergy producing substance. Heredity is often a factor in causing these problems. This means you may have some of the same allergies as your parents. Food allergies happen in all age groups. Food allergies are some of the most severe and life threatening. Some common food allergies are cow's milk, seafood, eggs, nuts, wheat, and soybeans. SYMPTOMS   Swelling around the mouth.  An itchy red rash or hives.  Vomiting or diarrhea.  Difficulty breathing. SEVERE ALLERGIC REACTIONS ARE LIFE-THREATENING. This reaction is called anaphylaxis. It can cause the mouth and throat to swell and cause difficulty with breathing and swallowing. In severe reactions only a trace amount of food (for example, peanut oil in a salad) may cause  death within seconds. Seasonal allergies occur in all age groups. These are seasonal because they usually occur during the same season every year. They may be a reaction to molds, grass pollens, or tree pollens. Other causes of problems are house dust mite allergens, pet dander, and mold spores. The symptoms often consist of nasal congestion, a runny itchy nose associated with sneezing, and tearing itchy eyes. There is often an associated itching of the mouth and ears. The problems happen when you come in contact with pollens and other allergens. Allergens are the particles in the air that the body reacts to with an allergic reaction. This causes you to release allergic antibodies. Through a chain of events, these eventually cause you to release histamine into the blood stream. Although it is meant to be protective to the body, it is this release that causes your discomfort. This is why you were given anti-histamines to feel better. If you are unable to pinpoint the offending allergen, it may be determined by skin or blood testing. Allergies cannot be cured but can be controlled with medicine. Hay fever is a collection of all or some of the seasonal allergy problems. It may often be treated with simple over-the-counter medicine such as diphenhydramine. Take medicine as directed. Do not drink alcohol or drive while taking this medicine. Check with your caregiver or package insert for child dosages. If these medicines are not effective, there are many new medicines your caregiver can prescribe. Stronger medicine such as nasal spray, eye drops, and corticosteroids may be used if the  first things you try do not work well. Other treatments such as immunotherapy or desensitizing injections can be used if all else fails. Follow up with your caregiver if problems continue. These seasonal allergies are usually not life threatening. They are generally more of a nuisance that can often be handled using medicine. HOME CARE  INSTRUCTIONS   If unsure what causes a reaction, keep a diary of foods eaten and symptoms that follow. Avoid foods that cause reactions.  If hives or rash are present:  Take medicine as directed.  You may use an over-the-counter antihistamine (diphenhydramine) for hives and itching as needed.  Apply cold compresses (cloths) to the skin or take baths in cool water. Avoid hot baths or showers. Heat will make a rash and itching worse.  If you are severely allergic:  Following a treatment for a severe reaction, hospitalization is often required for closer follow-up.  Wear a medic-alert bracelet or necklace stating the allergy.  You and your family must learn how to give adrenaline or use an anaphylaxis kit.  If you have had a severe reaction, always carry your anaphylaxis kit or EpiPen with you. Use this medicine as directed by your caregiver if a severe reaction is occurring. Failure to do so could have a fatal outcome. SEEK MEDICAL CARE IF:  You suspect a food allergy. Symptoms generally happen within 30 minutes of eating a food.  Your symptoms have not gone away within 2 days or are getting worse.  You develop new symptoms.  You want to retest yourself or your child with a food or drink you think causes an allergic reaction. Never do this if an anaphylactic reaction to that food or drink has happened before. Only do this under the care of a caregiver. SEEK IMMEDIATE MEDICAL CARE IF:   You have difficulty breathing, are wheezing, or have a tight feeling in your chest or throat.  You have a swollen mouth, or you have hives, swelling, or itching all over your body.  You have had a severe reaction that has responded to your anaphylaxis kit or an EpiPen. These reactions may return when the medicine has worn off. These reactions should be considered life threatening. MAKE SURE YOU:   Understand these instructions.  Will watch your condition.  Will get help right away if you are  not doing well or get worse. Document Released: 09/09/2002 Document Revised: 10/11/2012 Document Reviewed: 02/14/2008 [facility] Patient Information 2015 [place], [place]. This information is not intended to replace advice given to you by your health care provider. Make sure you discuss any questions you have with your health care provider.       A healthy lifestyle and preventative care can promote health and wellness.   Maintain regular health, dental, and eye exams.  Eat a healthy diet. Foods like vegetables, fruits, whole grains, low-fat dairy products, and lean protein foods contain the nutrients you need without too many calories. Decrease your intake of foods high in solid fats, added sugars, and salt. Get information about a proper diet from your caregiver, if necessary.  Regular physical exercise is one of the most important things you can do for your health. Most adults should get at least 150 minutes of moderate-intensity exercise (any activity that increases your heart rate and causes you to sweat) each week. In addition, most adults need muscle-strengthening exercises on 2 or more days a week.   Maintain a healthy weight. The body mass index (BMI) is a screening tool to identify  possible weight problems. It provides an estimate of body fat based on height and weight. Your caregiver can help determine your BMI, and can help you achieve or maintain a healthy weight. For adults 20 years and older:  A BMI below 18.5 is considered underweight.  A BMI of 18.5 to 24.9 is normal.  A BMI of 25 to 29.9 is considered overweight.  A BMI of 30 and above is considered obese.

## 2014-02-17 NOTE — Progress Notes (Signed)
Patient ID: [name], male   DOB: 1946-04-29, 68 y.o.   MRN: [id number]    Subjective:   Patient ID: [name] male    DOB: Jul 13, 1945 68 y.o.    MRN: [id number] Health Maintenance Due: Health Maintenance Due  Topic Date Due  . Zostavax  08/15/2007  . Influenza Vaccine  01/28/2014    _________________________________________________  HPI: [name] is a 68 y.o. male here for an acute visit for possible sinusitis.  Pt has a PMH outlined below.  Please see problem-based charting assessment and plan note for further details of medical issues addressed at today's visit.  PMH: Past Medical History  Diagnosis Date  . Coronary artery disease 10/02/2009    Cardiac cath (October 2007): 20% left main, 60% mid LAD, 60-70% distal LAD, 30-40% first diagonal, 30% circumflex, 40% OM, 30-40% RCA   . Aortic stenosis 09/08/2011    With mild aortic regurgitation, mean gradient 13 mmHg   . Paroxysmal atrial fibrillation 07/14/2006  . Essential hypertension 08/03/2013  . Hyperlipidemia LDL goal < 100 10/02/2009  . Erectile dysfunction 05/07/2009  . Degenerative joint disease of shoulder 08/03/2013    Bilateral, s/p right total shoulder arthroplasty 05/29/2011   . Sciatica associated with disorder of lumbar spine 08/03/2013    Anterolisthesis with right L 4-5 nerve root compression.  Treated with epidural injections and Physical Therapy.   . Allergic rhinitis 08/08/2013  . Diverticulosis 10/07/2013    Seen on colonoscopy in 2007   . Degenerative joint disease of shoulder 08/03/2013    Bilateral, s/p right total shoulder arthroplasty 05/29/2011.  Left s/p steroid injections with temporary relief.   . Tubular adenoma 10/07/2013    Removed endoscopically 04/10/2006   . Hemorrhoids 10/07/2013  . Gastric ulcer 10/07/2013    Seen on EGD 04/10/2006   . Seborrheic keratosis 10/07/2013  . Obesity (BMI 30.0-34.9) 10/07/2013    Medications: Current Outpatient Prescriptions on File Prior to Visit    Medication Sig Dispense Refill  . aspirin EC 325 MG tablet Take 1 tablet (325 mg total) by mouth daily.  90 tablet  3  . atorvastatin (LIPITOR) 40 MG tablet Take 1 tablet (40 mg total) by mouth daily.  90 tablet  3  . carvedilol (COREG) 12.5 MG tablet Take 1 tablet (12.5 mg total) by mouth 2 (two) times daily with a meal.  180 tablet  3  . loratadine (CLARITIN) 10 MG tablet Take 1 tablet (10 mg total) by mouth daily as needed for allergies.  30 tablet  2  . pantoprazole (PROTONIX) 40 MG tablet Take 1 tablet (40 mg total) by mouth daily.  90 tablet  3   No current facility-administered medications on file prior to visit.    Allergies: No Known Allergies  FH: Family History  Problem Relation Age of Onset  . Coronary artery disease Father 57    Died of myocardial infarction  . Bradycardia Mother 66    Requiring pacemeaker placement  . Coronary artery disease Brother 37    Died of myocardial infarction  . Obesity Daughter   . Healthy Son     SH: History   Social History  . Marital Status: Married    Spouse Name: N/A    Number of Children: N/A  . Years of Education: N/A   Social History Main Topics  . Smoking status: Former Smoker    Types: Cigarettes    Quit date: 04/18/2010  . Smokeless tobacco: Former Systems developer  Comment: smoked for 40 years and 1-2 packs daily  . Alcohol Use: No     Comment: qiuit drinking 07  . Drug Use: No  . Sexual Activity: No   Other Topics Concern  . None   Social History Narrative   Lives [place] with wife and dog.  Former Teacher, early years/pre.    Review of Systems: Constitutional: Negative for fever, chills and weight loss.  Eyes: Negative for blurred vision.  Respiratory: Negative for cough and shortness of breath.  Cardiovascular: Negative for chest pain, palpitations and leg swelling.  Gastrointestinal: Negative for nausea, vomiting, abdominal pain, diarrhea, constipation and blood in stool.   Genitourinary: Negative for dysuria, urgency and frequency.  Musculoskeletal: Negative for myalgias and back pain.  Neurological: Negative for dizziness, weakness and headaches.     Objective:   Vital Signs: Filed Vitals:   02/17/14 1059  BP: 118/73  Pulse: 78  Temp: 97 F (36.1 C)  TempSrc: Oral  Height: 5\' 6"  (1.676 m)  Weight: 191 lb 12.8 oz (87 kg)  SpO2: 95%      BP Readings from Last 3 Encounters:  02/17/14 118/73  10/07/13 140/78  08/23/13 147/76    Physical Exam: Constitutional: Vital signs reviewed.  Patient is well-developed and well-nourished in NAD and cooperative with exam.  Head: Normocephalic and atraumatic. Eyes: PERRL, EOMI, conjunctivae nl, no scleral icterus.  Ears: TMs normal without erythema.  Nose: Nasal mucosa normal.  Throat: Oral mucosa moist and pink without erythema or exudates.  Neck: Supple. Cardiovascular: RRR, no MRG. Pulmonary/Chest: normal effort, non-tender to palpation, CTAB, no wheezes, rales, or rhonchi. Abdominal: Soft. NT/ND +BS. Neurological: A&O x3, cranial nerves II-XII are grossly intact, moving all extremities. Extremities: 2+DP b/l; no pitting edema. Skin: Warm, dry and intact. No rash.   Assessment & Plan:   Assessment and plan was discussed and formulated with my attending.

## 2014-02-19 NOTE — Assessment & Plan Note (Addendum)
Pt reports a three week duration of rhinorrhea, watery/itchy eyes (worse when mowing grass), nasal congestion (reports greenish mucus), mild sore throat.  Denies fever/chills, lymphadenopathy, HA, CP, SOB, cough, post-nasal drip, otalgia, N/V, abdominal pain.  Reports the symptoms are about the same and are not improving.  Has tried some OTC claritin and an OTC nasal spray which he did not bring with him today and is unsure of what it is.  Reports a sick contact in his mother with similar symptoms who was given abx.  He is here to request abx.  He states he doesn't feel bad, just congested.  On exam, TMs are normal, nasal mucosa normal, and oropharynx mucosa without erythema or exudates.  No sinus tenderness.  No lymphadenopathy.  Lungs CTAB.  Likely not rhinosinusitis given no HA, sinus tenderness/pressure, fever.  More likely allergic rhinitis given worsening symptoms with outdoor exposure.  -d/c claritin as not effective -begin zyrtec 5mg  daily  -nasonex 2 sprays each nostril once daily (instructed to point away from nasal bone towards outside)  -advised to rinse nasal passages several times per day with saline wash such as NeilMed sinus rinse or similar generic brand  -no indication for abx at this time, requested pt to call clinic if symptoms worsen and/or do not improve in 1 week with

## 2014-02-20 NOTE — Progress Notes (Signed)
INTERNAL MEDICINE TEACHING ATTENDING ADDENDUM - Chinmay Squier, MD: I reviewed and discussed at the time of visit with the resident Dr. Gill, the patient's medical history, physical examination, diagnosis and results of pertinent tests and treatment and I agree with the patient's care as documented.  

## 2014-03-23 ENCOUNTER — Ambulatory Visit (INDEPENDENT_AMBULATORY_CARE_PROVIDER_SITE_OTHER): Payer: Commercial Managed Care - HMO | Admitting: *Deleted

## 2014-03-23 DIAGNOSIS — Z23 Encounter for immunization: Secondary | ICD-10-CM

## 2014-04-14 NOTE — Telephone Encounter (Signed)
Nothing was typed/tmj 

## 2014-05-05 ENCOUNTER — Ambulatory Visit: Payer: Self-pay | Admitting: Internal Medicine

## 2014-05-31 ENCOUNTER — Encounter (HOSPITAL_COMMUNITY): Payer: Self-pay

## 2014-05-31 ENCOUNTER — Encounter (HOSPITAL_COMMUNITY)
Admission: RE | Admit: 2014-05-31 | Discharge: 2014-05-31 | Disposition: A | Discharge status: Home / Self Care | Payer: Commercial Managed Care - HMO | Source: Ambulatory Visit | Attending: Orthopedic Surgery | Admitting: Orthopedic Surgery

## 2014-05-31 DIAGNOSIS — Z01812 Encounter for preprocedural laboratory examination: Secondary | ICD-10-CM | POA: Diagnosis not present

## 2014-05-31 HISTORY — DX: Unspecified cataract: H26.9

## 2014-05-31 LAB — COMPREHENSIVE METABOLIC PANEL
ALT: 27 U/L (ref 0–53)
AST: 37 U/L (ref 0–37)
Albumin: 3.8 g/dL (ref 3.5–5.2)
Alkaline Phosphatase: 67 U/L (ref 39–117)
Anion gap: 14 (ref 5–15)
BUN: 18 mg/dL (ref 6–23)
CO2: 22 mEq/L (ref 19–32)
Calcium: 8.9 mg/dL (ref 8.4–10.5)
Chloride: 106 mEq/L (ref 96–112)
Creatinine, Ser: 0.98 mg/dL (ref 0.50–1.35)
GFR calc Af Amer: >90 mL/min (ref >90)
GFR calc non Af Amer: 84 mL/min — ABNORMAL LOW (ref >90)
Glucose, Bld: 104 mg/dL — ABNORMAL HIGH (ref 70–99)
Potassium: 4.1 mEq/L (ref 3.7–5.3)
Sodium: 142 mEq/L (ref 137–147)
Total Bilirubin: 0.4 mg/dL (ref 0.3–1.2)
Total Protein: 7.2 g/dL (ref 6.0–8.3)

## 2014-05-31 LAB — CBC WITH DIFFERENTIAL/PLATELET
Basophils Absolute: 0.1 10*3/uL (ref 0.0–0.1)
Basophils Relative: 1 % (ref 0–1)
Eosinophils Absolute: 0.3 10*3/uL (ref 0.0–0.7)
Eosinophils Relative: 4 % (ref 0–5)
HCT: 44 % (ref 39.0–52.0)
Hemoglobin: 15.2 g/dL (ref 13.0–17.0)
Lymphocytes Relative: 29 % (ref 12–46)
Lymphs Abs: 2.2 10*3/uL (ref 0.7–4.0)
MCH: 31.1 pg (ref 26.0–34.0)
MCHC: 34.5 g/dL (ref 30.0–36.0)
MCV: 90 fL (ref 78.0–100.0)
Monocytes Absolute: 0.6 10*3/uL (ref 0.1–1.0)
Monocytes Relative: 8 % (ref 3–12)
Neutro Abs: 4.4 10*3/uL (ref 1.7–7.7)
Neutrophils Relative %: 58 % (ref 43–77)
Platelets: 156 10*3/uL (ref 150–400)
RBC: 4.89 MIL/uL (ref 4.22–5.81)
RDW: 12.7 % (ref 11.5–15.5)
WBC: 7.6 10*3/uL (ref 4.0–10.5)

## 2014-05-31 LAB — PROTIME-INR
INR: 1.11 (ref 0.00–1.49)
Prothrombin Time: 14.4 seconds (ref 11.6–15.2)

## 2014-05-31 LAB — SURGICAL PCR SCREEN
MRSA, PCR: NEGATIVE
Staphylococcus aureus: POSITIVE — AB

## 2014-05-31 LAB — APTT: aPTT: 30 seconds (ref 24–37)

## 2014-05-31 MED ORDER — CHLORHEXIDINE GLUCONATE 4 % EX LIQD: 60.0000 mL | Freq: Once | CUTANEOUS | Status: DC

## 2014-05-31 NOTE — Pre-Procedure Instructions (Signed)
[name]  05/31/2014   Your procedure is scheduled on:  Thurs, Dec 10 @ 7:30 AM  Report to [facility]  at 5:30 AM.  Call this number if you have problems the morning of surgery: [phone]   Remember:   Do not eat food or drink liquids after midnight.   Take these medicines the morning of surgery with A SIP OF WATER: Coreg(Carvedilol)              Stop taking your Aspirin. No Goody's,BC's,Aleve,Ibuprofen,Fish Oil,or any Herbal Medications   Do not wear jewelry  Do not wear lotions, powders, or colognes.   Men may shave face and neck.  Do not bring valuables to the hospital.  [facility] is not responsible                  for any belongings or valuables.               Contacts, dentures or bridgework may not be worn into surgery.  Leave suitcase in the car. After surgery it may be brought to your room.  For patients admitted to the hospital, discharge time is determined by your                treatment team.                  Special Instructions:  [facility] - Preparing for Surgery  Before surgery, you can play an important role.  Because skin is not sterile, your skin needs to be as free of germs as possible.  You can reduce the number of germs on you skin by washing with CHG (chlorahexidine gluconate) soap before surgery.  CHG is an antiseptic cleaner which kills germs and bonds with the skin to continue killing germs even after washing.  Please DO NOT use if you have an allergy to CHG or antibacterial soaps.  If your skin becomes reddened/irritated stop using the CHG and inform your nurse when you arrive at Short Stay.  Do not shave (including legs and underarms) for at least 48 hours prior to the first CHG shower.  You may shave your face.  Please follow these instructions carefully:   1.  Shower with CHG Soap the night before surgery and the                                morning of Surgery.  2.  If you choose to wash your hair, wash your hair first as  usual with your       normal shampoo.  3.  After you shampoo, rinse your hair and body thoroughly to remove the                      Shampoo.  4.  Use CHG as you would any other liquid soap.  You can apply chg directly       to the skin and wash gently with scrungie or a clean washcloth.  5.  Apply the CHG Soap to your body ONLY FROM THE NECK DOWN.        Do not use on open wounds or open sores.  Avoid contact with your eyes,       ears, mouth and genitals (private parts).  Wash genitals (private parts)       with your normal soap.  6.  Wash thoroughly, paying special  attention to the area where your surgery        will be performed.  7.  Thoroughly rinse your body with warm water from the neck down.  8.  DO NOT shower/wash with your normal soap after using and rinsing off       the CHG Soap.  9.  Pat yourself dry with a clean towel.            10.  Wear clean pajamas.            11.  Place clean sheets on your bed the night of your first shower and do not        sleep with pets.  Day of Surgery  Do not apply any lotions/deoderants the morning of surgery.  Please wear clean clothes to the hospital/surgery center.     Please read over the following fact sheets that you were given: Pain Booklet, Coughing and Deep Breathing, MRSA Information and Surgical Site Infection Prevention

## 2014-05-31 NOTE — Progress Notes (Addendum)
Cardiologist is Dr.Brian Crenshaw last visit in epic from 07-2013   Heart cath in epic from 2007  Multiple echo reports in epic from 01/04/09/13/15  EKG and CXR in epic from 08-04-13  Denies ever having a stress test  Medical MD is Dr.Lawrence Virgina Jock

## 2014-06-07 MED ORDER — LACTATED RINGERS IV SOLN: INTRAVENOUS | Status: DC

## 2014-06-07 MED ORDER — CEFAZOLIN SODIUM-DEXTROSE 2-3 GM-% IV SOLR
2.0000 g | INTRAVENOUS | Status: AC
  Administered 2014-06-08: 2 g via INTRAVENOUS
  Filled 2014-06-07: qty 50

## 2014-06-08 ENCOUNTER — Inpatient Hospital Stay (HOSPITAL_COMMUNITY): Payer: Medicare HMO | Admitting: Anesthesiology

## 2014-06-08 ENCOUNTER — Inpatient Hospital Stay (HOSPITAL_COMMUNITY)
Admission: RE | Admit: 2014-06-08 | Discharge: 2014-06-09 | DRG: 483 | Disposition: A | Discharge status: Home / Self Care | Payer: Medicare HMO | Source: Ambulatory Visit | Attending: Orthopedic Surgery | Admitting: Orthopedic Surgery

## 2014-06-08 ENCOUNTER — Encounter (HOSPITAL_COMMUNITY): Payer: Self-pay | Admitting: *Deleted

## 2014-06-08 ENCOUNTER — Encounter (HOSPITAL_COMMUNITY)
Admission: RE | Disposition: A | Discharge status: Home / Self Care | Payer: Self-pay | Source: Ambulatory Visit | Attending: Orthopedic Surgery

## 2014-06-08 DIAGNOSIS — I2510 Atherosclerotic heart disease of native coronary artery without angina pectoris: Secondary | ICD-10-CM | POA: Diagnosis not present

## 2014-06-08 DIAGNOSIS — Z7982 Long term (current) use of aspirin: Secondary | ICD-10-CM | POA: Diagnosis not present

## 2014-06-08 DIAGNOSIS — Z79899 Other long term (current) drug therapy: Secondary | ICD-10-CM

## 2014-06-08 DIAGNOSIS — Z8249 Family history of ischemic heart disease and other diseases of the circulatory system: Secondary | ICD-10-CM

## 2014-06-08 DIAGNOSIS — I480 Paroxysmal atrial fibrillation: Secondary | ICD-10-CM | POA: Diagnosis present

## 2014-06-08 DIAGNOSIS — I10 Essential (primary) hypertension: Secondary | ICD-10-CM | POA: Diagnosis present

## 2014-06-08 DIAGNOSIS — Z96611 Presence of right artificial shoulder joint: Secondary | ICD-10-CM | POA: Diagnosis present

## 2014-06-08 DIAGNOSIS — I350 Nonrheumatic aortic (valve) stenosis: Secondary | ICD-10-CM | POA: Diagnosis present

## 2014-06-08 DIAGNOSIS — K219 Gastro-esophageal reflux disease without esophagitis: Secondary | ICD-10-CM | POA: Diagnosis present

## 2014-06-08 DIAGNOSIS — Z87891 Personal history of nicotine dependence: Secondary | ICD-10-CM

## 2014-06-08 DIAGNOSIS — M19012 Primary osteoarthritis, left shoulder: Principal | ICD-10-CM | POA: Diagnosis present

## 2014-06-08 DIAGNOSIS — E785 Hyperlipidemia, unspecified: Secondary | ICD-10-CM | POA: Diagnosis present

## 2014-06-08 HISTORY — PX: TOTAL SHOULDER ARTHROPLASTY: SHX126

## 2014-06-08 SURGERY — ARTHROPLASTY, SHOULDER, TOTAL
Anesthesia: General | Laterality: Left

## 2014-06-08 MED ORDER — DOCUSATE SODIUM 100 MG PO CAPS
100.0000 mg | ORAL_CAPSULE | Freq: Two times a day (BID) | ORAL | Status: DC
  Administered 2014-06-08: 100 mg via ORAL
  Filled 2014-06-08 (×3): qty 1

## 2014-06-08 MED ORDER — ACETAMINOPHEN 325 MG PO TABS: 650.0000 mg | ORAL_TABLET | Freq: Four times a day (QID) | ORAL | Status: DC | PRN

## 2014-06-08 MED ORDER — ALUM & MAG HYDROXIDE-SIMETH 200-200-20 MG/5ML PO SUSP: 30.0000 mL | ORAL | Status: DC | PRN

## 2014-06-08 MED ORDER — FENTANYL CITRATE 0.05 MG/ML IJ SOLN: 25.0000 ug | INTRAMUSCULAR | Status: DC | PRN

## 2014-06-08 MED ORDER — SUCCINYLCHOLINE CHLORIDE 20 MG/ML IJ SOLN
INTRAMUSCULAR | Status: AC
  Filled 2014-06-08: qty 1

## 2014-06-08 MED ORDER — ONDANSETRON HCL 4 MG PO TABS: 4.0000 mg | ORAL_TABLET | Freq: Four times a day (QID) | ORAL | Status: DC | PRN

## 2014-06-08 MED ORDER — MEPERIDINE HCL 25 MG/ML IJ SOLN: 6.2500 mg | INTRAMUSCULAR | Status: DC | PRN

## 2014-06-08 MED ORDER — DIPHENHYDRAMINE HCL 12.5 MG/5ML PO ELIX: 12.5000 mg | ORAL_SOLUTION | ORAL | Status: DC | PRN

## 2014-06-08 MED ORDER — METOCLOPRAMIDE HCL 5 MG/ML IJ SOLN: 5.0000 mg | Freq: Three times a day (TID) | INTRAMUSCULAR | Status: DC | PRN

## 2014-06-08 MED ORDER — PROPOFOL 10 MG/ML IV BOLUS
INTRAVENOUS | Status: DC | PRN
  Administered 2014-06-08: 150 mg via INTRAVENOUS

## 2014-06-08 MED ORDER — ONDANSETRON HCL 4 MG/2ML IJ SOLN: 4.0000 mg | Freq: Four times a day (QID) | INTRAMUSCULAR | Status: DC | PRN

## 2014-06-08 MED ORDER — ROCURONIUM BROMIDE 100 MG/10ML IV SOLN
INTRAVENOUS | Status: DC | PRN
  Administered 2014-06-08: 50 mg via INTRAVENOUS

## 2014-06-08 MED ORDER — OXYCODONE-ACETAMINOPHEN 5-325 MG PO TABS: 1.0000 | ORAL_TABLET | ORAL | Status: DC | PRN

## 2014-06-08 MED ORDER — GLYCOPYRROLATE 0.2 MG/ML IJ SOLN
INTRAMUSCULAR | Status: AC
  Filled 2014-06-08: qty 2

## 2014-06-08 MED ORDER — CEFAZOLIN SODIUM 1-5 GM-% IV SOLN
1.0000 g | Freq: Four times a day (QID) | INTRAVENOUS | Status: AC
  Administered 2014-06-08 – 2014-06-09 (×3): 1 g via INTRAVENOUS
  Filled 2014-06-08 (×4): qty 50

## 2014-06-08 MED ORDER — MIDAZOLAM HCL 2 MG/2ML IJ SOLN
INTRAMUSCULAR | Status: AC
  Filled 2014-06-08: qty 2

## 2014-06-08 MED ORDER — EPHEDRINE SULFATE 50 MG/ML IJ SOLN
INTRAMUSCULAR | Status: AC
  Filled 2014-06-08: qty 1

## 2014-06-08 MED ORDER — MENTHOL 3 MG MT LOZG: 1.0000 | LOZENGE | OROMUCOSAL | Status: DC | PRN

## 2014-06-08 MED ORDER — LIDOCAINE HCL (CARDIAC) 20 MG/ML IV SOLN
INTRAVENOUS | Status: DC | PRN
  Administered 2014-06-08: 100 mg via INTRAVENOUS

## 2014-06-08 MED ORDER — NEOSTIGMINE METHYLSULFATE 10 MG/10ML IV SOLN
INTRAVENOUS | Status: AC
  Filled 2014-06-08: qty 1

## 2014-06-08 MED ORDER — ROCURONIUM BROMIDE 50 MG/5ML IV SOLN
INTRAVENOUS | Status: AC
  Filled 2014-06-08: qty 1

## 2014-06-08 MED ORDER — LACTATED RINGERS IV SOLN
INTRAVENOUS | Status: DC | PRN
  Administered 2014-06-08 (×2): via INTRAVENOUS

## 2014-06-08 MED ORDER — METOCLOPRAMIDE HCL 10 MG PO TABS: 5.0000 mg | ORAL_TABLET | Freq: Three times a day (TID) | ORAL | Status: DC | PRN

## 2014-06-08 MED ORDER — FENTANYL CITRATE 0.05 MG/ML IJ SOLN
INTRAMUSCULAR | Status: DC | PRN
  Administered 2014-06-08 (×2): 25 ug via INTRAVENOUS
  Administered 2014-06-08 (×3): 50 ug via INTRAVENOUS

## 2014-06-08 MED ORDER — MIDAZOLAM HCL 5 MG/5ML IJ SOLN
INTRAMUSCULAR | Status: DC | PRN
Start: 2014-06-08 — End: 2014-06-08
  Administered 2014-06-08: 1 mg via INTRAVENOUS

## 2014-06-08 MED ORDER — PROMETHAZINE HCL 25 MG/ML IJ SOLN: 6.2500 mg | INTRAMUSCULAR | Status: DC | PRN

## 2014-06-08 MED ORDER — GLYCOPYRROLATE 0.2 MG/ML IJ SOLN
INTRAMUSCULAR | Status: DC | PRN
  Administered 2014-06-08: 0.4 mg via INTRAVENOUS

## 2014-06-08 MED ORDER — DIAZEPAM 5 MG PO TABS: 5.0000 mg | ORAL_TABLET | Freq: Four times a day (QID) | ORAL | Status: DC | PRN

## 2014-06-08 MED ORDER — DEXAMETHASONE SODIUM PHOSPHATE 4 MG/ML IJ SOLN
INTRAMUSCULAR | Status: DC | PRN
  Administered 2014-06-08: 4 mg via INTRAVENOUS

## 2014-06-08 MED ORDER — ONDANSETRON HCL 4 MG/2ML IJ SOLN
INTRAMUSCULAR | Status: DC | PRN
  Administered 2014-06-08: 4 mg via INTRAVENOUS

## 2014-06-08 MED ORDER — KETOROLAC TROMETHAMINE 15 MG/ML IJ SOLN
15.0000 mg | Freq: Four times a day (QID) | INTRAMUSCULAR | Status: DC
  Administered 2014-06-08 – 2014-06-09 (×4): 15 mg via INTRAVENOUS
  Filled 2014-06-08 (×8): qty 1

## 2014-06-08 MED ORDER — ASPIRIN EC 325 MG PO TBEC
325.0000 mg | DELAYED_RELEASE_TABLET | Freq: Every day | ORAL | Status: DC
  Filled 2014-06-08: qty 1

## 2014-06-08 MED ORDER — PROPOFOL 10 MG/ML IV BOLUS
INTRAVENOUS | Status: AC
  Filled 2014-06-08: qty 20

## 2014-06-08 MED ORDER — 0.9 % SODIUM CHLORIDE (POUR BTL) OPTIME
TOPICAL | Status: DC | PRN
  Administered 2014-06-08: 1000 mL

## 2014-06-08 MED ORDER — CARVEDILOL 12.5 MG PO TABS
12.5000 mg | ORAL_TABLET | Freq: Two times a day (BID) | ORAL | Status: DC
  Administered 2014-06-08: 12.5 mg via ORAL
  Filled 2014-06-08 (×4): qty 1

## 2014-06-08 MED ORDER — ONDANSETRON HCL 4 MG/2ML IJ SOLN
INTRAMUSCULAR | Status: AC
  Filled 2014-06-08: qty 2

## 2014-06-08 MED ORDER — ACETAMINOPHEN 650 MG RE SUPP
650.0000 mg | Freq: Four times a day (QID) | RECTAL | Status: DC | PRN
Start: 2014-06-08 — End: 2014-06-09

## 2014-06-08 MED ORDER — LACTATED RINGERS IV SOLN: INTRAVENOUS | Status: DC

## 2014-06-08 MED ORDER — ARTIFICIAL TEARS OP OINT
TOPICAL_OINTMENT | OPHTHALMIC | Status: AC
  Filled 2014-06-08: qty 3.5

## 2014-06-08 MED ORDER — PHENOL 1.4 % MT LIQD: 1.0000 | OROMUCOSAL | Status: DC | PRN

## 2014-06-08 MED ORDER — PHENYLEPHRINE 40 MCG/ML (10ML) SYRINGE FOR IV PUSH (FOR BLOOD PRESSURE SUPPORT)
PREFILLED_SYRINGE | INTRAVENOUS | Status: AC
  Filled 2014-06-08: qty 10

## 2014-06-08 MED ORDER — NEOSTIGMINE METHYLSULFATE 10 MG/10ML IV SOLN
INTRAVENOUS | Status: DC | PRN
  Administered 2014-06-08: 3 mg via INTRAVENOUS

## 2014-06-08 MED ORDER — DEXTROSE 5 % IV SOLN
10.0000 mg | INTRAVENOUS | Status: DC | PRN
  Administered 2014-06-08: 20 ug/min via INTRAVENOUS

## 2014-06-08 MED ORDER — FENTANYL CITRATE 0.05 MG/ML IJ SOLN
INTRAMUSCULAR | Status: AC
  Filled 2014-06-08: qty 5

## 2014-06-08 MED ORDER — HYDROMORPHONE HCL 1 MG/ML IJ SOLN: 0.2500 mg | INTRAMUSCULAR | Status: DC | PRN

## 2014-06-08 SURGICAL SUPPLY — 75 items
ADH SKN CLS LQ APL DERMABOND (GAUZE/BANDAGES/DRESSINGS) ×1
BLADE SAW SGTL 83.5X18.5 (BLADE) ×2 IMPLANT
CAPT SHLDR TOTAL 2 ×2 IMPLANT
CEMENT BONE DEPUY (Cement) ×1 IMPLANT
COVER SURGICAL LIGHT HANDLE (MISCELLANEOUS) ×2 IMPLANT
DERMABOND ADHESIVE PROPEN (GAUZE/BANDAGES/DRESSINGS) ×1
DERMABOND ADVANCED .7 DNX6 (GAUZE/BANDAGES/DRESSINGS) IMPLANT
DRAPE IMP U-DRAPE 54X76 (DRAPES) ×2 IMPLANT
DRAPE INCISE IOBAN 66X45 STRL (DRAPES) ×2 IMPLANT
DRAPE ORTHO SPLIT 77X108 STRL (DRAPES) ×4
DRAPE SURG 17X11 SM STRL (DRAPES) ×2 IMPLANT
DRAPE SURG 17X23 STRL (DRAPES) ×2 IMPLANT
DRAPE SURG ORHT 6 SPLT 77X108 (DRAPES) ×2 IMPLANT
DRAPE U-SHAPE 47X51 STRL (DRAPES) ×2 IMPLANT
DRILL BIT 7/64X5 (BIT) ×1 IMPLANT
DRSG AQUACEL AG ADV 3.5X10 (GAUZE/BANDAGES/DRESSINGS) ×2 IMPLANT
DRSG MEPILEX BORDER 4X4 (GAUZE/BANDAGES/DRESSINGS) IMPLANT
DRSG MEPILEX BORDER 4X8 (GAUZE/BANDAGES/DRESSINGS) ×1 IMPLANT
DURAPREP 26ML APPLICATOR (WOUND CARE) ×2 IMPLANT
ELECT BLADE 4.0 EZ CLEAN MEGAD (MISCELLANEOUS) ×2
ELECT CAUTERY BLADE 6.4 (BLADE) ×2 IMPLANT
ELECT REM PT RETURN 9FT ADLT (ELECTROSURGICAL) ×2
ELECTRODE BLDE 4.0 EZ CLN MEGD (MISCELLANEOUS) ×1 IMPLANT
ELECTRODE REM PT RTRN 9FT ADLT (ELECTROSURGICAL) ×1 IMPLANT
FACESHIELD WRAPAROUND (MASK) ×6 IMPLANT
FACESHIELD WRAPAROUND OR TEAM (MASK) ×3 IMPLANT
GLOVE BIO SURGEON STRL SZ 6.5 (GLOVE) ×1 IMPLANT
GLOVE BIO SURGEON STRL SZ7.5 (GLOVE) ×3 IMPLANT
GLOVE BIO SURGEON STRL SZ8 (GLOVE) ×2 IMPLANT
GLOVE BIOGEL PI IND STRL 6.5 (GLOVE) ×1 IMPLANT
GLOVE BIOGEL PI IND STRL 8 (GLOVE) IMPLANT
GLOVE BIOGEL PI INDICATOR 6.5 (GLOVE) ×1
GLOVE BIOGEL PI INDICATOR 8 (GLOVE) ×1
GLOVE EUDERMIC 7 POWDERFREE (GLOVE) ×2 IMPLANT
GLOVE SS BIOGEL STRL SZ 7.5 (GLOVE) ×1 IMPLANT
GLOVE SUPERSENSE BIOGEL SZ 7.5 (GLOVE) ×1
GLOVE SURG SS PI 6.5 STRL IVOR (GLOVE) ×1 IMPLANT
GOWN STRL REUS W/ TWL LRG LVL3 (GOWN DISPOSABLE) ×2 IMPLANT
GOWN STRL REUS W/ TWL XL LVL3 (GOWN DISPOSABLE) ×2 IMPLANT
GOWN STRL REUS W/TWL LRG LVL3 (GOWN DISPOSABLE) ×4
GOWN STRL REUS W/TWL XL LVL3 (GOWN DISPOSABLE) ×4
KIT BASIN OR (CUSTOM PROCEDURE TRAY) ×2 IMPLANT
KIT ROOM TURNOVER OR (KITS) ×2 IMPLANT
MANIFOLD NEPTUNE II (INSTRUMENTS) ×2 IMPLANT
NDL HYPO 25GX1X1/2 BEV (NEEDLE) IMPLANT
NDL SUT 6 .5 CRC .975X.05 MAYO (NEEDLE) ×1 IMPLANT
NEEDLE HYPO 25GX1X1/2 BEV (NEEDLE) IMPLANT
NEEDLE MAYO TAPER (NEEDLE) ×2
NS IRRIG 1000ML POUR BTL (IV SOLUTION) ×2 IMPLANT
PACK SHOULDER (CUSTOM PROCEDURE TRAY) ×2 IMPLANT
PACK UNIVERSAL I (CUSTOM PROCEDURE TRAY) ×1 IMPLANT
PAD ARMBOARD 7.5X6 YLW CONV (MISCELLANEOUS) ×4 IMPLANT
PASSER SUT SWANSON 36MM LOOP (INSTRUMENTS) ×1 IMPLANT
PIN METAGLENE 2.5 (PIN) IMPLANT
SLING ARM IMMOBILIZER LRG (SOFTGOODS) ×2 IMPLANT
SLING ARM LRG ADULT FOAM STRAP (SOFTGOODS) IMPLANT
SLING ARM XL FOAM STRAP (SOFTGOODS) ×2 IMPLANT
SMARTMIX MINI TOWER (MISCELLANEOUS) ×2
SPONGE LAP 18X18 X RAY DECT (DISPOSABLE) ×1 IMPLANT
SPONGE LAP 4X18 X RAY DECT (DISPOSABLE) ×2 IMPLANT
STRIP CLOSURE SKIN 1/2X4 (GAUZE/BANDAGES/DRESSINGS) ×2 IMPLANT
SUCTION FRAZIER TIP 10 FR DISP (SUCTIONS) ×2 IMPLANT
SUT BONE WAX W31G (SUTURE) IMPLANT
SUT FIBERWIRE #2 38 T-5 BLUE (SUTURE) ×6
SUT MNCRL AB 3-0 PS2 18 (SUTURE) ×2 IMPLANT
SUT VIC AB 1 CT1 27 (SUTURE) ×4
SUT VIC AB 1 CT1 27XBRD ANBCTR (SUTURE) ×3 IMPLANT
SUT VIC AB 2-0 CT1 27 (SUTURE) ×4
SUT VIC AB 2-0 CT1 TAPERPNT 27 (SUTURE) ×2 IMPLANT
SUTURE FIBERWR #2 38 T-5 BLUE (SUTURE) ×2 IMPLANT
SYR CONTROL 10ML LL (SYRINGE) IMPLANT
TOWEL OR 17X24 6PK STRL BLUE (TOWEL DISPOSABLE) ×2 IMPLANT
TOWEL OR 17X26 10 PK STRL BLUE (TOWEL DISPOSABLE) ×2 IMPLANT
TOWER SMARTMIX MINI (MISCELLANEOUS) ×1 IMPLANT
WATER STERILE IRR 1000ML POUR (IV SOLUTION) ×1 IMPLANT

## 2014-06-08 NOTE — Discharge Instructions (Signed)
° °  Kevin M. Supple, M.D., F.A.A.O.S. °Orthopaedic Surgery °Specializing in Arthroscopic and Reconstructive °Surgery of the Shoulder and Knee °[phone] °[address] - [place], [place] [address] - Fax [phone] ° ° °POST-OP TOTAL SHOULDER REPLACEMENT/SHOULDER HEMIARTHROPLASTY INSTRUCTIONS ° °1. Call the office at [phone] to schedule your first post-op appointment 10-14 days from the date of your surgery. ° °2. The bandage over your incision is waterproof. You may begin showering with this dressing on. You may leave this dressing on until first follow up appointment within 2 weeks. If you would like to remove it you may do so after the 5th day. Go slow and tug at the borders gently to break the bond the dressing has with the skin. The steri strips may come off with the dressing. At this point if there is no drainage it is okay to go without a bandage or you may cover it with a light guaze and tape. Leave the steri-strips in place over your incision. You can expect drainage that is bloody or yellow in nature that should gradually decrease from day of surgery. Change your dressing daily until drainage is completely resolved, then you may feel free to go without a bandage. You can also expect significant bruising around your shoulder that will drift down your arm and into your chest wall. This is very normal and should resolve over several days. ° ° 3. Wear your sling/immobilizer at all times except to perform the exercises below or to occasionally let your arm dangle by your side to stretch your elbow. You also need to sleep in your sling immobilizer until instructed otherwise. ° °4. Range of motion to your elbow, wrist, and hand are encouraged 3-5 times daily. Exercise to your hand and fingers helps to reduce swelling you may experience. ° °5. Utilize ice to the shoulder 3-5 times minimum a day and additionally if you are experiencing pain. ° °6. Prescriptions for a pain medication and a muscle  relaxant are provided for you. It is recommended that if you are experiencing pain that you pain medication alone is not controlling, add the muscle relaxant along with the pain medication which can give additional pain relief. The first 1-2 days is generally the most severe of your pain and then should gradually decrease. As your pain lessens it is recommended that you decrease your use of the pain medications to an "as needed basis'" only and to always comply with the recommended dosages of the pain medications. ° °7. Pain medications can produce constipation along with their use. If you experience this, the use of an over the counter stool softener or laxative daily is recommended.  ° °8. For most patients, if insurance allows, home health services to include therapy has been arranged. ° °9. For additional questions or concerns, please do not hesitate to call the office. If after hours there is an answering service to forward your concerns to the physician on call. ° °POST-OP EXERCISES ° °Pendulum Exercises ° °Perform pendulum exercises while standing and bending at the waist. Support your uninvolved arm on a table or chair and allow your operated arm to hang freely. Make sure to do these exercises passively - not using you shoulder muscles. ° °Repeat 20 times. Do 3 sessions per day. ° ° ° ° °

## 2014-06-08 NOTE — Anesthesia Procedure Notes (Signed)
Anesthesia Regional Block:  Supraclavicular block  Pre-Anesthetic Checklist: ,, timeout performed, Correct Patient, Correct Site, Correct Laterality, Correct Procedure, Correct Position, site marked, Risks and benefits discussed,  Surgical consent,  Pre-op evaluation,  At surgeon's request and post-op pain management  Laterality: Left  Prep: Maximum Sterile Barrier Precautions used and chloraprep       Needles:  Injection technique: Single-shot  Needle Type: Echogenic Stimulator Needle     Needle Length: 10cm 10 cm Needle Gauge: 22 and 22 G    Additional Needles:  Procedures: ultrasound guided (picture in chart) and nerve stimulator Supraclavicular block  Nerve Stimulator or Paresthesia:  Response: 0.5 mA,   Additional Responses:   Narrative:  Injection made incrementally with aspirations every 5 mL. Anesthesiologist: [name]  Additional Notes: L supra clavicular block, [place] and stimulator, 64ml .5% marcaine, multiple asp, talked with patient throughout, no comp

## 2014-06-08 NOTE — Transfer of Care (Signed)
Immediate Anesthesia Transfer of Care Note  Patient: [name]  Procedure(s) Performed: Procedure(s): LEFT TOTAL SHOULDER ARTHROPLASTY (Left)  Patient Location: PACU  Anesthesia Type:General and Regional  Level of Consciousness: awake, alert  and oriented  Airway & Oxygen Therapy: Patient Spontanous Breathing and Patient connected to nasal cannula oxygen  Post-op Assessment: Report given to PACU RN  Post vital signs: Reviewed and stable  Complications: No apparent anesthesia complications

## 2014-06-08 NOTE — H&P (Signed)
[name]    Chief Complaint: OA LEFT SHOULDER HPI: The patient is a 68 y.o. male with end stage left shoulder OA  Past Medical History  Diagnosis Date  . Coronary artery disease 10/02/2009    Cardiac cath (October 2007): 20% left main, 60% mid LAD, 60-70% distal LAD, 30-40% first diagonal, 30% circumflex, 40% OM, 30-40% RCA   . Aortic stenosis 09/08/2011    With mild aortic regurgitation, mean gradient 13 mmHg   . Paroxysmal atrial fibrillation 07/14/2006    takes Coreg daily  . Hyperlipidemia LDL goal < 100 10/02/2009    takes Atorvastatin daily  . Erectile dysfunction 05/07/2009  . Degenerative joint disease of shoulder 08/03/2013    Bilateral, s/p right total shoulder arthroplasty 05/29/2011   . Sciatica associated with disorder of lumbar spine 08/03/2013    Anterolisthesis with right L 4-5 nerve root compression.  Treated with epidural injections and Physical Therapy.   . Allergic rhinitis 08/08/2013    takes Loratadine daily   . Diverticulosis 10/07/2013    Seen on colonoscopy in 2007   . Degenerative joint disease of shoulder 08/03/2013    Bilateral, s/p right total shoulder arthroplasty 05/29/2011.  Left s/p steroid injections with temporary relief.   . Hemorrhoids 10/07/2013  . Gastric ulcer 10/07/2013    Seen on EGD 04/10/2006   . Seborrheic keratosis 10/07/2013  . CHF (congestive heart failure) 2007    hx of;was on Lasix but has been off for several yrs  . Essential hypertension 08/03/2013    had been on Lisinopril but stopped per MD  . History of bronchitis   . Joint pain   . Pinched nerve     in back  . Bruises easily   . History of colon polyps   . Nocturia   . Cataract     right but immature    Past Surgical History  Procedure Laterality Date  . Tonsillectomy    . Total shoulder arthroplasty  05/29/2011    Procedure: TOTAL SHOULDER ARTHROPLASTY;  Surgeon: Metta Clines Maverick Patman;  Location: [facility];  Service: Orthopedics;  Laterality: Right;  . Knee arthroscopy with meniscal  repair Right 01/30/2011  . Cardiac catheterization  2007  . Right finger surgery      as a child  . Colonoscopy    . Cardioversion      Family History  Problem Relation Age of Onset  . Coronary artery disease Father 49    Died of myocardial infarction  . Bradycardia Mother 43    Requiring pacemeaker placement  . Coronary artery disease Brother 6    Died of myocardial infarction  . Obesity Daughter   . Healthy Son     Social History:  reports that he has quit smoking. His smoking use included Cigarettes. He smoked 0.00 packs per day. He has quit using smokeless tobacco. He reports that he drinks alcohol. He reports that he does not use illicit drugs.  Allergies: No Known Allergies  Medications Prior to Admission  Medication Sig Dispense Refill  . aspirin EC 325 MG tablet Take 1 tablet (325 mg total) by mouth daily. 90 tablet 3  . atorvastatin (LIPITOR) 40 MG tablet Take 1 tablet (40 mg total) by mouth daily. 90 tablet 3  . carvedilol (COREG) 12.5 MG tablet Take 1 tablet (12.5 mg total) by mouth 2 (two) times daily with a meal. 180 tablet 3     Physical Exam: left shoulder with painful and restricted motion as noted at  recent office visits  Vitals  Temp:  [98.7 F (37.1 C)] 98.7 F (37.1 C) (12/10 0627) Pulse Rate:  [78] 78 (12/10 0558) Resp:  [18] 18 (12/10 0558) BP: (163)/(79) 163/79 mmHg (12/10 0558) SpO2:  [95 %] 95 % (12/10 0558) Weight:  [87.091 kg (192 lb)] 87.091 kg (192 lb) (12/10 0558)  Assessment/Plan  Impression: OA LEFT SHOULDER  Plan of Action: Procedure(s): LEFT TOTAL SHOULDER ARTHROPLASTY  Carigan Lister M 06/08/2014, 7:25 AM

## 2014-06-08 NOTE — Op Note (Signed)
[name], [name]                ACCOUNT NO.:  [id number]  MEDICAL RECORD NO.:  [id number]  LOCATION:  [facility]                         FACILITY:  [facility]  PHYSICIAN:  Richard Clines. Cheney Gosch, M.D.  DATE OF BIRTH:  [birth year]  DATE OF PROCEDURE:  06/08/2014 DATE OF DISCHARGE:                              OPERATIVE REPORT   PREOPERATIVE DIAGNOSIS:  End-stage left shoulder osteoarthritis.  POSTOPERATIVE DIAGNOSIS:  End-stage left shoulder osteoarthritis.  PROCEDURE:  Left total shoulder arthroplasty utilizing a press-fit size 12 DePuy modular stem with a 44 x 21 standard head and a 44 pegged cemented glenoid.  SURGEON:  Richard Clines. Claudetta Sallie, M.D.  Terrence DupontOlivia Mackie A. Shuford, PA-C.  ANESTHESIA:  General endotracheal as well as an interscalene block.  ESTIMATED BLOOD LOSS:  250 mL.  DRAINS:  None.  HISTORY:  [name] is a 68 year old gentleman who has had chronic and progressive increasing left shoulder pain with restricted mobility and functional limitations related to end-stage left shoulder glenohumeral arthritis __________ joint space narrowing, subchondral sclerosis.  He is brought to the operating room at this time for planned left total shoulder arthroplasty.  Preoperatively, I counseled [name] regarding treatment options and risks versus benefits thereof.  Possible surgical complications were all reviewed including potential for bleeding, infection, neurovascular injury, persistent pain, loss of motion, anesthetic complication, failure of the implant, and possible need for additional surgery.  He understands and accepts and agrees with our planned procedure.  PROCEDURE IN DETAIL:  After undergoing routine preop evaluation, the patient received prophylactic antibiotics and an interscalene block was established in the holding area by the Anesthesia Department.  Placed supine on the operating table, underwent smooth induction of a general endotracheal anesthesia.  Placed into  beach-chair position and appropriately padded and protected.  Left shoulder girdle region was sterilely prepped and draped in standard fashion.  A time-out was called.  An anterior deltopectoral approach made to the left shoulder through a 12-cm incision.  Skin flaps were elevated.  Electrocautery was used for hemostasis.  Dissection was carried deeply.  The cephalic vein was identified and retracted laterally with the deltoid.  Pectoralis major retracted medially, the upper centimeter and half tenotomized to enhance exposure.  Adhesions were divided beneath the deltoid.  The conjoined tendon was identified, mobilized, and retracted medially.  The biceps tendon was unroofed and the bicipital groove tenotomized for later tenodesis, and then the rotator cuff was then split along the rotator interval to the base of the coracoid and the subscapularis was tenotomized from the lesser tuberosity leaving a 1 cm cuff tissue for later repair.  We then divided the capsular attachments of the humeral head.  Anteroinferior and inferior to allow complete delivery of the head through the wound.  The extramedullary guide was then used to outline our proposed humeral head resection, which was then performed mimicking native retroversion approximately 30 degrees.  This was completed with an oscillating saw.  Care was taken to protect the rotator cuff.  At this point, we removed the prominent osteophytes on the anterior and inferior aspects of the humeral head.  We then hand reamed the humeral canal up to size 12 and then  broached up to size 12 with excellent fit and fixation.  A metal cap was then placed over the cut surface of the proximal humerus.  We then exposed the glenoid with a combination of pitchfork, snake tongue, and Fukuda retractors.  The labrum was then excised circumferentially and a very thick band of tissue at the inferior margin of the glenoid was also excised.  A guidepin was then placed  into the center of the glenoid and the and the glenoid was reamed with 44 reamer which had the best fit and coverage. This was reamed to a stable subchondral bony bed and a small amount of retroversion was corrected to neutral alignment much to our satisfaction.  Central drill hole was placed followed by the peripheral peg holes.  The glenoid was irrigated.  Trial glenoid showed excellent fit.  The cement was then mixed at the back table and then once the glenoid was cleaned and dried, it was introduced into the peripheral peg holes and the final glenoid was impacted into position and cement was allowed to harden.  We then returned our attention to the proximal humerus where humeral stem was then impacted with bone grafting proximally retrieved from the resected humeral head.  Final seating was excellent fixation mimicking the native retroversion.  We then performed a series of trial reductions and ultimately selected the 44 x 21 standard head and this was impacted into position after the [facility] taper was cleaned and dried.  Final reduction showed excellent stability approximately 50% translation of the humeral head and glenoid and excellent shoulder motion.  Good soft tissue balancing noted.  The subscapularis was repaired back to the lesser tuberosity with #2 FiberWire suture.  Rotator interval was closed with a pair of figure-of- eight #2 FiberWire.  Biceps tendon was then tenodesed at the upper level pectoralis major with #2 FiberWire.  The wound was then copiously irrigated.  Hemostasis obtained.  The deltopectoral interval was then reapproximated with a series of figure-of-eight #1 Vicryl suture.  2-0 Vicryl used for subcu layer and intracuticular 3-0 Monocryl for the skin followed by Dermabond and Aquacel dressing.  Left arm was placed in a sling.  The patient was awakened, extubated, and taken to recovery room in stable condition.  Richard Loges, PA-C, was used as an Environmental consultant  throughout this case essential for help with positioning the patient, positioning the extremity, management of the tissue retractors, manipulation of the extremity, implantation of the prosthesis, wound closure, and intraoperative decision making.     [name], M.D.     KMS/MEDQ  D:  06/08/2014  T:  06/08/2014  Job:  [id number]

## 2014-06-08 NOTE — Anesthesia Preprocedure Evaluation (Addendum)
Anesthesia Evaluation   Patient awake    Reviewed: Allergy & Precautions, H&P , NPO status , Patient's Chart, lab work & pertinent test results, reviewed documented beta blocker date and time   Airway Mallampati: II       Dental  (+) Edentulous Upper, Lower Dentures   Pulmonary former smoker,  breath sounds clear to auscultation        Cardiovascular hypertension, Pt. on medications + Valvular Problems/Murmurs (mild) AS Rhythm:Regular  ECHO 08/2013 EF 55% mild AS   Neuro/Psych    GI/Hepatic Neg liver ROS, GERD-  Medicated,  Endo/Other  negative endocrine ROS  Renal/GU negative Renal ROS  negative genitourinary   Musculoskeletal   Abdominal (+)  Abdomen: soft.    Peds negative pediatric ROS (+)  Hematology negative hematology ROS (+)   Anesthesia Other Findings   Reproductive/Obstetrics                           Anesthesia Physical Anesthesia Plan  ASA: II  Anesthesia Plan: General   Post-op Pain Management:    Induction: Intravenous  Airway Management Planned: Oral ETT  Additional Equipment:   Intra-op Plan:   Post-operative Plan: Extubation in OR  Informed Consent: I have reviewed the patients History and Physical, chart, labs and discussed the procedure including the risks, benefits and alternatives for the proposed anesthesia with the patient or authorized representative who has indicated his/her understanding and acceptance.     Plan Discussed with:   Anesthesia Plan Comments:         Anesthesia Quick Evaluation

## 2014-06-08 NOTE — Op Note (Signed)
06/08/2014  9:29 AM  PATIENT:   [name]  68 y.o. male  PRE-OPERATIVE DIAGNOSIS:  OA LEFT SHOULDER  POST-OPERATIVE DIAGNOSIS:  same  PROCEDURE:  L TSA #12 stem, 44x21 standard head, 44 glenoid  SURGEON:  Jamarkis Branam, Metta Clines M.D.  ASSISTANTS: Shuford pac   ANESTHESIA:   GET + ISB  EBL: 250  SPECIMEN:  none  Drains: none   PATIENT DISPOSITION:  PACU - hemodynamically stable.    PLAN OF CARE: Admit to inpatient   Dictation# [id number]

## 2014-06-08 NOTE — Anesthesia Postprocedure Evaluation (Signed)
  Anesthesia Post-op Note  Patient: [name]  Procedure(s) Performed: Procedure(s): LEFT TOTAL SHOULDER ARTHROPLASTY (Left)  Patient Location: PACU  Anesthesia Type:General  Level of Consciousness: awake and alert   Airway and Oxygen Therapy: Patient Spontanous Breathing and Patient connected to nasal cannula oxygen  Post-op Pain: none  Post-op Assessment: Post-op Vital signs reviewed, Patient's Cardiovascular Status Stable, Respiratory Function Stable, Patent Airway and No signs of Nausea or vomiting  Post-op Vital Signs: Reviewed and stable  Last Vitals:  Filed Vitals:   06/08/14 0627  BP:   Pulse:   Temp: 37.1 C  Resp:     Complications: No apparent anesthesia complications

## 2014-06-08 NOTE — Progress Notes (Signed)
Utilization review completed.  

## 2014-06-09 ENCOUNTER — Encounter (HOSPITAL_COMMUNITY): Payer: Self-pay | Admitting: Orthopedic Surgery

## 2014-06-09 MED ORDER — DIAZEPAM 5 MG PO TABS
2.5000 mg | ORAL_TABLET | Freq: Four times a day (QID) | ORAL | Status: DC | PRN
Start: 2014-06-09 — End: 2014-07-28

## 2014-06-09 MED ORDER — OXYCODONE-ACETAMINOPHEN 5-325 MG PO TABS: 1.0000 | ORAL_TABLET | ORAL | Status: DC | PRN

## 2014-06-09 NOTE — Discharge Summary (Signed)
PATIENT ID:      [name]  MRN:     [id number] DOB/AGE:    66-Oct-1947 / 66 y.o.     DISCHARGE SUMMARY  ADMISSION DATE:    06/08/2014 DISCHARGE DATE:    ADMISSION DIAGNOSIS: OA LEFT SHOULDER Past Medical History  Diagnosis Date  . Coronary artery disease 10/02/2009    Cardiac cath (October 2007): 20% left main, 60% mid LAD, 60-70% distal LAD, 30-40% first diagonal, 30% circumflex, 40% OM, 30-40% RCA   . Aortic stenosis 09/08/2011    With mild aortic regurgitation, mean gradient 13 mmHg   . Paroxysmal atrial fibrillation 07/14/2006    takes Coreg daily  . Hyperlipidemia LDL goal < 100 10/02/2009    takes Atorvastatin daily  . Erectile dysfunction 05/07/2009  . Degenerative joint disease of shoulder 08/03/2013    Bilateral, s/p right total shoulder arthroplasty 05/29/2011   . Sciatica associated with disorder of lumbar spine 08/03/2013    Anterolisthesis with right L 4-5 nerve root compression.  Treated with epidural injections and Physical Therapy.   . Allergic rhinitis 08/08/2013    takes Loratadine daily   . Diverticulosis 10/07/2013    Seen on colonoscopy in 2007   . Degenerative joint disease of shoulder 08/03/2013    Bilateral, s/p right total shoulder arthroplasty 05/29/2011.  Left s/p steroid injections with temporary relief.   . Hemorrhoids 10/07/2013  . Gastric ulcer 10/07/2013    Seen on EGD 04/10/2006   . Seborrheic keratosis 10/07/2013  . CHF (congestive heart failure) 2007    hx of;was on Lasix but has been off for several yrs  . Essential hypertension 08/03/2013    had been on Lisinopril but stopped per MD  . History of bronchitis   . Joint pain   . Pinched nerve     in back  . Bruises easily   . History of colon polyps   . Nocturia   . Cataract     right but immature    DISCHARGE DIAGNOSIS:   Active Problems:   S/P shoulder replacement   PROCEDURE: Procedure(s): LEFT TOTAL SHOULDER ARTHROPLASTY on 06/08/2014  CONSULTS:   none  HISTORY:  See H&P in  chart.  HOSPITAL COURSE:  ZAIDAN KEEBLE is a 68 y.o. admitted on 06/08/2014 with a chief complaint of left shoulder pain and dysfunction and had previously undergone R TSA and done well, and found to have a diagnosis of OA LEFT SHOULDER.  They were brought to the operating room on 06/08/2014 and underwent Procedure(s): LEFT TOTAL SHOULDER ARTHROPLASTY.    They were given perioperative antibiotics: Anti-infectives    Start     Dose/Rate Route Frequency Ordered Stop   06/08/14 1400  ceFAZolin (ANCEF) IVPB 1 g/50 mL premix     1 g100 mL/hr over 30 Minutes Intravenous Every 6 hours 06/08/14 1246 06/09/14 0213   06/08/14 0600  ceFAZolin (ANCEF) IVPB 2 g/50 mL premix     2 g100 mL/hr over 30 Minutes Intravenous On call to O.R. 06/07/14 1357 06/08/14 0747    .  Patient underwent the above named procedure and tolerated it well. The following day they were hemodynamically stable and pain was controlled on oral analgesics. They were neurovascularly intact to the operative extremity. OT was ordered and worked with patient per protocol. They were medically and orthopaedically stable for discharge on day 1 .    DIAGNOSTIC STUDIES:  RECENT RADIOGRAPHIC STUDIES :  No results found.  RECENT VITAL SIGNS:  Patient Vitals  for the past 24 hrs:  BP Temp Temp src Pulse Resp SpO2  06/09/14 0509 (!) 123/58 mmHg 98.6 F (37 C) Oral 88 - 95 %  06/09/14 0138 136/63 mmHg 98.1 F (36.7 C) Oral 62 - 92 %  06/08/14 2015 122/83 mmHg 98.4 F (36.9 C) Oral 93 - 93 %  06/08/14 1205 134/73 mmHg 97.4 F (36.3 C) Oral 74 20 93 %  06/08/14 1152 (!) 147/80 mmHg 97.6 F (36.4 C) - 74 (!) 23 95 %  06/08/14 1130 (!) 145/72 mmHg - - 70 12 94 %  06/08/14 1100 134/72 mmHg - - 70 12 95 %  06/08/14 1030 (!) 148/74 mmHg 97.7 F (36.5 C) - 63 17 91 %  06/08/14 1015 140/71 mmHg - - 66 18 94 %  06/08/14 1000 136/73 mmHg - - 65 16 93 %  06/08/14 0946 (!) 155/78 mmHg 98.5 F (36.9 C) - 73 12 95 %  .  RECENT EKG RESULTS:     Orders placed or performed during the hospital encounter of 08/04/13  . EKG 12-Lead  . EKG 12-Lead  . EKG    DISCHARGE INSTRUCTIONS:    DISCHARGE MEDICATIONS:     Medication List    TAKE these medications        aspirin EC 325 MG tablet  Take 1 tablet (325 mg total) by mouth daily.     atorvastatin 40 MG tablet  Commonly known as:  LIPITOR  Take 1 tablet (40 mg total) by mouth daily.     carvedilol 12.5 MG tablet  Commonly known as:  COREG  Take 1 tablet (12.5 mg total) by mouth 2 (two) times daily with a meal.     diazepam 5 MG tablet  Commonly known as:  VALIUM  Take 0.5-1 tablets (2.5-5 mg total) by mouth every 6 (six) hours as needed for muscle spasms or sedation.     oxyCODONE-acetaminophen 5-325 MG per tablet  Commonly known as:  PERCOCET  Take 1-2 tablets by mouth every 4 (four) hours as needed.        FOLLOW UP VISIT:       Follow-up Information    Follow up with Marin Shutter, MD.   Specialty:  Orthopedic Surgery   Why:  call to be seen in 10-14 days   Contact information:   [address] [place] [address] [phone]       DISCHARGE TO: Home   DISPOSITION: Good   DISCHARGE CONDITION:  Festus Barren for Dr. Justice Britain 06/09/2014, 8:03 AM

## 2014-06-09 NOTE — Plan of Care (Signed)
Problem: Consults Goal: Shoulder Surgery Patient Education See Patient Education Module for education specifics.  Outcome: Completed/Met Date Met:  06/09/14 Goal: Diagnosis - Shoulder Surgery Outcome: Completed/Met Date Met:  06/09/14 Total Shoulder Arthroplasty Left   Problem: Phase I Progression Outcomes Goal: Pain controlled with appropriate interventions Outcome: Completed/Met Date Met:  06/09/14 Goal: OOB as tolerated unless otherwise ordered Outcome: Completed/Met Date Met:  06/09/14 Goal: Clear liquids, advance diet as tolerated Outcome: Completed/Met Date Met:  06/09/14 Goal: Post op CNS neurovascular WDL Outcome: Completed/Met Date Met:  06/09/14 Goal: Voiding-avoid urinary catheter unless indicated Outcome: Completed/Met Date Met:  06/09/14 Goal: Hemodynamically stable Outcome: Completed/Met Date Met:  06/09/14

## 2014-06-09 NOTE — Progress Notes (Signed)
Occupational Therapy Evaluation and Discharge Patient Details Name: [name] MRN: [id number] DOB: 07-08-1945 Today's Date: 06/09/2014    History of Present Illness [name] is a 68 y.o. Male s/p L TSA. Pt with hx of R TSA. PMH: CAD with cardiac cath, aortic stenosis, a-fib, HLD, sciatica with anterolisthesis R L4-5 nerve root compression, diverticulosis, CHF, HTN, and DJD of Bil shoulders.    Clinical Impression   PTA pt lived at home with his wife and was independent with ADLs and IADLs. Pt currently limited by decrease ROM of L shoulder following TSA and requires assist for UB ADLs. Pt participated in training and education for compensatory techniques for UB ADLs and therapeutic exercises. No further acute OT needs.     Follow Up Recommendations  No OT follow up;Supervision - Intermittent    Equipment Recommendations  None recommended by OT    Recommendations for Other Services       Precautions / Restrictions Precautions Precautions: Shoulder Type of Shoulder Precautions: Supple Protocol (PROM shoulder 30* ER, 60* ABD, 90* FF and pendulums) Shoulder Interventions: Shoulder sling/immobilizer;For comfort (and sleep) Precaution Booklet Issued: Yes (comment) Precaution Comments: Educated pt on shoulder precautions and incorporating into ADLs.  Required Braces or Orthoses: Sling Restrictions Weight Bearing Restrictions: Yes LUE Weight Bearing: Non weight bearing      Mobility Bed Mobility Overal bed mobility: Modified Independent                Transfers Overall transfer level: Modified independent Equipment used: None                  Balance Overall balance assessment: No apparent balance deficits (not formally assessed)                                          ADL Overall ADL's : Needs assistance/impaired Eating/Feeding: Set up;Sitting Eating/Feeding Details (indicate cue type and reason): pt requires assist to open  containers and cut food Grooming: Supervision/safety;Standing;Set up   Upper Body Bathing: Minimal assitance;Sitting   Lower Body Bathing: Supervison/ safety;Set up;Sit to/from stand   Upper Body Dressing : Moderate assistance;Sitting Upper Body Dressing Details (indicate cue type and reason): including sling Lower Body Dressing: Supervision/safety;Set up;Sit to/from stand   Toilet Transfer: Modified Independent   Toileting- Clothing Manipulation and Hygiene: Modified independent       Functional mobility during ADLs: Modified independent General ADL Comments: Pt familiar with compensatory techniques from R TSA ~3-4 years ago. Reviewed compensatory techniques for UB ADLs and therapeutic exercises.      Vision  Pt reports no change from baseline.                    Perception Perception Perception Tested?: No   Praxis Praxis Praxis tested?: Within functional limits    Pertinent Vitals/Pain Pain Assessment: 0-10 Pain Score: 3  Pain Location: L shoulder with movement Pain Descriptors / Indicators: Aching Pain Intervention(s): Monitored during session;Repositioned     Hand Dominance Right   Extremity/Trunk Assessment Upper Extremity Assessment Upper Extremity Assessment: LUE deficits/detail LUE Deficits / Details: L shoulder TSA (Supple Protocol) LUE: Unable to fully assess due to pain;Unable to fully assess due to immobilization LUE Coordination: decreased gross motor   Lower Extremity Assessment Lower Extremity Assessment: Overall WFL for tasks assessed   Cervical / Trunk Assessment Cervical / Trunk Assessment: Normal  Communication Communication Communication: No difficulties   Cognition Arousal/Alertness: Awake/alert Behavior During Therapy: WFL for tasks assessed/performed Overall Cognitive Status: Within Functional Limits for tasks assessed                        Exercises Exercises: Shoulder     06/09/14 0900  Shoulder Exercises   Pendulum Exercise Left;10 reps;Standing  Shoulder Flexion PROM;Left;10 reps;Supine (to ~45*)  Shoulder Extension PROM;Left;10 reps;Supine  Shoulder ABduction PROM;Left;10 reps;Supine (to ~40*)  Shoulder External Rotation PROM;Left;10 reps;Supine (to ~10* past neutral)  Elbow Flexion AROM;Left;10 reps;Seated  Elbow Extension AROM;Left;10 reps;Seated  Wrist Flexion AROM;Left;10 reps;Seated  Wrist Extension AROM;Left;10 reps;Seated  Digit Composite Flexion AROM;Left;10 reps;Seated  Composite Extension AROM;Left;10 reps;Seated      Shoulder Instructions Shoulder Instructions Donning/doffing shirt without moving shoulder: Moderate assistance Method for sponge bathing under operated UE: Minimal assistance Donning/doffing sling/immobilizer: Moderate assistance Correct positioning of sling/immobilizer: Supervision/safety Pendulum exercises (written home exercise program): Supervision/safety ROM for elbow, wrist and digits of operated UE: Independent Sling wearing schedule (on at all times/off for ADL's): Independent Proper positioning of operated UE when showering: Independent Positioning of UE while sleeping: [place] expects to be discharged to:: Private residence Living Arrangements: Spouse/significant other Available Help at Discharge: Family;Available 24 hours/day Type of Home: House Home Access: Level entry     Home Layout: One level     Bathroom Shower/Tub: Occupational psychologist: Standard                Prior Functioning/Environment Level of Independence: Independent        Comments: Pt drives    OT Diagnosis: Generalized weakness;Acute pain    End of Session Equipment Utilized During Treatment: Other (comment) (sling) Nurse Communication: Other (comment) (pt ready for d/c from OT standpoint)  Activity Tolerance: Patient tolerated treatment well Patient left: in chair;with call bell/phone within reach    Time: 0840-0907 OT Time Calculation (min): 27 min Charges:  OT General Charges $OT Visit: 1 Procedure OT Evaluation $Initial OT Evaluation Tier I: 1 Procedure OT Treatments $Therapeutic Exercise: 8-22 mins  Juluis Rainier 06/09/2014, 9:18 AM   Cyndie Chime, OTR/L Occupational Therapist [phone] (pager)

## 2014-07-05 DIAGNOSIS — M19012 Primary osteoarthritis, left shoulder: Secondary | ICD-10-CM | POA: Diagnosis not present

## 2014-07-28 ENCOUNTER — Ambulatory Visit (INDEPENDENT_AMBULATORY_CARE_PROVIDER_SITE_OTHER): Payer: Commercial Managed Care - HMO | Admitting: Internal Medicine

## 2014-07-28 ENCOUNTER — Ambulatory Visit (HOSPITAL_COMMUNITY)
Admission: RE | Admit: 2014-07-28 | Discharge: 2014-07-28 | Disposition: A | Discharge status: Home / Self Care | Payer: Commercial Managed Care - HMO | Source: Ambulatory Visit | Attending: Internal Medicine | Admitting: Internal Medicine

## 2014-07-28 ENCOUNTER — Encounter: Payer: Self-pay | Admitting: Internal Medicine

## 2014-07-28 VITALS — BP 137/83 | HR 80 | Temp 98.2°F | Wt 192.4 lb

## 2014-07-28 DIAGNOSIS — L821 Other seborrheic keratosis: Secondary | ICD-10-CM

## 2014-07-28 DIAGNOSIS — J301 Allergic rhinitis due to pollen: Secondary | ICD-10-CM

## 2014-07-28 DIAGNOSIS — M19012 Primary osteoarthritis, left shoulder: Secondary | ICD-10-CM

## 2014-07-28 DIAGNOSIS — L570 Actinic keratosis: Secondary | ICD-10-CM

## 2014-07-28 DIAGNOSIS — I2510 Atherosclerotic heart disease of native coronary artery without angina pectoris: Secondary | ICD-10-CM | POA: Diagnosis not present

## 2014-07-28 DIAGNOSIS — J309 Allergic rhinitis, unspecified: Secondary | ICD-10-CM

## 2014-07-28 DIAGNOSIS — D369 Benign neoplasm, unspecified site: Secondary | ICD-10-CM

## 2014-07-28 DIAGNOSIS — I10 Essential (primary) hypertension: Secondary | ICD-10-CM | POA: Diagnosis not present

## 2014-07-28 DIAGNOSIS — I480 Paroxysmal atrial fibrillation: Secondary | ICD-10-CM | POA: Diagnosis not present

## 2014-07-28 DIAGNOSIS — I38 Endocarditis, valve unspecified: Secondary | ICD-10-CM

## 2014-07-28 DIAGNOSIS — E785 Hyperlipidemia, unspecified: Secondary | ICD-10-CM | POA: Diagnosis not present

## 2014-07-28 DIAGNOSIS — D139 Benign neoplasm of ill-defined sites within the digestive system: Secondary | ICD-10-CM

## 2014-07-28 DIAGNOSIS — Z0000 Encounter for general adult medical examination without abnormal findings: Secondary | ICD-10-CM

## 2014-07-28 DIAGNOSIS — Z7982 Long term (current) use of aspirin: Secondary | ICD-10-CM

## 2014-07-28 DIAGNOSIS — Z23 Encounter for immunization: Secondary | ICD-10-CM

## 2014-07-28 MED ORDER — ATORVASTATIN CALCIUM 40 MG PO TABS
40.0000 mg | ORAL_TABLET | Freq: Every day | ORAL | Status: DC
Start: 2014-07-28 — End: 2015-07-04

## 2014-07-28 MED ORDER — CARVEDILOL 12.5 MG PO TABS: 12.5000 mg | ORAL_TABLET | Freq: Two times a day (BID) | ORAL | Status: DC

## 2014-07-28 NOTE — Progress Notes (Signed)
   Subjective:    Patient ID: [name], male    DOB: 30-Aug-1945, 69 y.o.   MRN: [id number]  HPI  Please see the A&P for the status of the pt's chronic medical problems.  Review of Systems  Constitutional: Negative for activity change, appetite change and unexpected weight change.  HENT: Negative for congestion, rhinorrhea and sinus pressure.   Respiratory: Negative for cough, chest tightness, shortness of breath and wheezing.   Cardiovascular: Negative for chest pain, palpitations and leg swelling.  Gastrointestinal: Negative for nausea, vomiting, abdominal pain, diarrhea, constipation and abdominal distention.  Musculoskeletal: Positive for arthralgias. Negative for myalgias, back pain, joint swelling, gait problem, neck pain and neck stiffness.  Skin: Positive for rash. Negative for color change and wound.       Acktinic keratosis and seborrheic keratosis  Neurological: Negative for syncope, weakness and light-headedness.      Objective:   Physical Exam  Constitutional: He is oriented to person, place, and time. He appears well-developed and well-nourished. No distress.  HENT:  Head: Normocephalic and atraumatic.  Eyes: Conjunctivae are normal. Right eye exhibits no discharge. Left eye exhibits no discharge. No scleral icterus.  Cardiovascular: Normal rate and regular rhythm.  Exam reveals no gallop and no friction rub.   Murmur heard. II/VI systolic murmur best heard at the right upper sternal boarder consistent with a murmur of aortic stenosis  Pulmonary/Chest: Effort normal and breath sounds normal. No respiratory distress. He has no wheezes. He has no rales.  Abdominal: Soft. Bowel sounds are normal. He exhibits no distension. There is no tenderness. There is no rebound and no guarding.  Musculoskeletal: Normal range of motion. He exhibits no edema or tenderness.  Neurological: He is alert and oriented to person, place, and time. He exhibits normal muscle tone.  Skin: Skin  is warm and dry. He is not diaphoretic.  Numerous small actinic keratoses and seborrheic keratoses  Psychiatric: He has a normal mood and affect. His behavior is normal. Judgment and thought content normal.  Nursing note and vitals reviewed.     Assessment & Plan:   Please see problem oriented charting.

## 2014-07-28 NOTE — Assessment & Plan Note (Signed)
He denies any palpitations suggestive of paroxysmal atrial fibrillation. He remains on the Coreg 12.5 mg twice daily should he flip back into atrial fibrillation. He is also being managed with aspirin 325 mg by mouth daily rather than warfarin secondary to resolution of his cardiomyopathy with therapy. We will therefore continue the aspirin at 325 mg by mouth daily.

## 2014-07-28 NOTE — Assessment & Plan Note (Signed)
His blood pressure today was 137/83 which is within target on the Coreg 12.5 mg by mouth twice daily. This regimen will be continued. We will reassess blood pressure control at the follow-up visit.

## 2014-07-28 NOTE — Assessment & Plan Note (Signed)
In August he presented with signs and symptoms consistent with an allergic rhinitis exacerbation. He was given a nasal steroid and said he had some brief improvement but overall it was not very effective. We will keep this in mind in the future as nasal steroids may not help him symptomatically.

## 2014-07-28 NOTE — Assessment & Plan Note (Signed)
He is tolerating the atorvastatin 40 mg by mouth daily without any myalgias. There is also no symptomatic suggestion that he's suffering any liver problems from the atorvastatin either. He asked about a lipid panel and LFTs and I told him that per the most recent guidelines neither was necessary. He will remain on the high intensity statin and we will reassess for symptoms of myalgias at the follow-up visit.

## 2014-07-28 NOTE — Assessment & Plan Note (Signed)
At the last visit he was given a prescription for the Zostavax. He has not had it administered secondary to the cost through [facility]. We will look into the costs of the Zostavax through the [facility] employee pharmacy. He is willing to pay a modest amount and will decide once we can provide him with the cost through that pharmacy. He still has the prescription in hand. He was willing to take the Pneumovax 13 and this was provided during this visit.   Finally, he states his cardiologist wants a yearly ECG. Given that he is asymptomatic and his exam is unremarkable except for the stable aortic stenotic murmur I was unclear as to the indication for this but wanted to make sure his cardiologist had this study available if this was his request. We therefore obtained an ECG today which demonstrated normal sinus rhythm at 71 bpm with normal axis and intervals. There was no evidence of significant Q waves or left ventricular hypertrophy by voltage. R wave progression was normal and he had nonspecific ST-T wave changes. This image was loaded into the computer so that his cardiologist would have it available for review.

## 2014-07-28 NOTE — Assessment & Plan Note (Signed)
On exam today he had numerous seborrheic and actinic keratoses. None of them appear to require therapy at this time and per his report have all been stable in size. We will reassess his skin exam at the return visit.

## 2014-07-28 NOTE — Patient Instructions (Signed)
It was great to see you again.  You are doing well.  1) Keep taking the medications as you are.  I rewrote the prescriptions for 1 year.  2) We got an ECG today for your Cardiologist.  We will upload it into the system so he can see it if he chooses.  3) We gave you the new pneumonia shot today.  4) We are trying to get information about the out of pocket costs for the shingles shot for you.  I will see you back in 1 year, sooner if necessary.

## 2014-07-28 NOTE — Assessment & Plan Note (Signed)
He denies any chest pain on the carvedilol 12.5 mg by mouth twice daily, aspirin 325 mg by mouth daily, and atorvastatin 40 mg by mouth daily. We will continue the carvedilol, aspirin, and atorvastatin at the current doses and reassess for evidence of angina at the follow-up visit.

## 2014-07-28 NOTE — Assessment & Plan Note (Signed)
At the last visit we had set up a surveillance colonoscopy to follow-up on his tubular adenoma. Apparently there was some red tape that he experienced in trying to get the procedure done. Unfortunately, at this point he does not want surveillance colonoscopy but will let me know if he changes his mind. We will readdress this issue at the follow-up visit.

## 2014-08-01 ENCOUNTER — Telehealth: Payer: Self-pay | Admitting: Cardiology

## 2014-08-01 NOTE — Telephone Encounter (Signed)
Spoke with pt, his EKG was done at his primary care and he wanted to know if we had seen. Advised patient we have in the computer and everything looked fine.

## 2014-08-01 NOTE — Telephone Encounter (Signed)
Left message for pt to call.

## 2014-08-01 NOTE — Telephone Encounter (Signed)
New Message  Pt wanted to know if NL received the echo that was performed on 1/29. Please call back and discuss.

## 2014-08-23 NOTE — Telephone Encounter (Signed)
A user error has taken place: encounter opened in error, closed for administrative reasons.

## 2014-09-01 ENCOUNTER — Other Ambulatory Visit: Payer: Self-pay | Admitting: *Deleted

## 2014-09-01 DIAGNOSIS — Z471 Aftercare following joint replacement surgery: Secondary | ICD-10-CM

## 2014-09-01 DIAGNOSIS — Z96612 Presence of left artificial shoulder joint: Secondary | ICD-10-CM

## 2014-09-01 NOTE — Progress Notes (Signed)
Call from DrSupple's office (orthopedic), requesting referral be placed. Pt is already being followed by DrSupple, but his insurance requires that an official referral be placed by his pcp.  Order placed.Despina Hidden Cassady3/4/20164:35 PM

## 2014-09-04 DIAGNOSIS — Z471 Aftercare following joint replacement surgery: Secondary | ICD-10-CM | POA: Diagnosis not present

## 2014-09-04 DIAGNOSIS — Z96612 Presence of left artificial shoulder joint: Secondary | ICD-10-CM | POA: Diagnosis not present

## 2014-10-06 ENCOUNTER — Telehealth: Payer: Self-pay | Admitting: *Deleted

## 2014-10-06 NOTE — Telephone Encounter (Signed)
Pt stopped by clinic about  BP reading 135/83 to 163/98. States his heart doctor told him not to go over 140. No c/o - appt made with Dr Heber [place] 10/13/14 9:45AM and to bring list BP reading. Richard Mccauley RN 4.9.16 9:30AM

## 2014-10-11 ENCOUNTER — Ambulatory Visit: Payer: Self-pay | Admitting: Internal Medicine

## 2014-10-12 ENCOUNTER — Telehealth: Payer: Self-pay | Admitting: Internal Medicine

## 2014-10-12 NOTE — Telephone Encounter (Signed)
Call to patient to confirm appointment for 10/13/14 at 9:45 lmtcb

## 2014-10-13 ENCOUNTER — Encounter: Payer: Self-pay | Admitting: Internal Medicine

## 2014-10-13 ENCOUNTER — Ambulatory Visit (INDEPENDENT_AMBULATORY_CARE_PROVIDER_SITE_OTHER): Payer: Commercial Managed Care - HMO | Admitting: Internal Medicine

## 2014-10-13 VITALS — BP 143/73 | HR 67 | Temp 97.5°F | Ht 66.0 in | Wt 191.2 lb

## 2014-10-13 DIAGNOSIS — I10 Essential (primary) hypertension: Secondary | ICD-10-CM

## 2014-10-13 LAB — BASIC METABOLIC PANEL WITH GFR
BUN: 21 mg/dL (ref 6–23)
CO2: 27 mEq/L (ref 19–32)
Calcium: 9.2 mg/dL (ref 8.4–10.5)
Chloride: 104 mEq/L (ref 96–112)
Creat: 1.03 mg/dL (ref 0.50–1.35)
GFR, Est African American: 86 mL/min
GFR, Est Non African American: 75 mL/min
Glucose, Bld: 74 mg/dL (ref 70–99)
Potassium: 4.4 mEq/L (ref 3.5–5.3)
Sodium: 139 mEq/L (ref 135–145)

## 2014-10-13 MED ORDER — LISINOPRIL 10 MG PO TABS: 10.0000 mg | ORAL_TABLET | Freq: Every day | ORAL | Status: DC

## 2014-10-13 MED ORDER — LISINOPRIL 10 MG PO TABS: 10.0000 mg | ORAL_TABLET | Freq: Every day | ORAL | Status: AC

## 2014-10-13 NOTE — Progress Notes (Signed)
[facility] Subjective:   Patient ID: [name] male   DOB: 1946/01/17 69 y.o.   MRN: [id number]  HPI: [name] is a 69 y.o. male with a PMH detailed below who presents for HTN follow up.  He has previous been well controlled with his BP on Coreg 12.5mg  BID.  However he is been checking his BP at home and noticed that he has mostly been >140 SBP.  Review of his log does confirm this with some readings into 160 range but mostly 130s-150s.  He denies any symptoms of high blood pressure or otherwise and reports he is feeling well.  Past Medical History  Diagnosis Date  . Coronary artery disease 10/02/2009    Cardiac cath (October 2007): 20% left main, 60% mid LAD, 60-70% distal LAD, 30-40% first diagonal, 30% circumflex, 40% OM, 30-40% RCA   . Aortic stenosis 09/08/2011    With mild aortic regurgitation, mean gradient 13 mmHg   . Paroxysmal atrial fibrillation 07/14/2006  . Hyperlipidemia 10/02/2009  . Erectile dysfunction 05/07/2009  . Degenerative joint disease of shoulder 08/03/2013    Bilateral, s/p right total shoulder arthroplasty 05/29/2011 and left shoulder arthroplasty 06/08/2014  . Sciatica associated with disorder of lumbar spine 08/03/2013    Anterolisthesis with right L 4-5 nerve root compression.  Treated with epidural injections and Physical Therapy.   . Allergic rhinitis 08/08/2013    takes Loratadine daily   . Diverticulosis 10/07/2013    Seen on colonoscopy in 2007   . Hemorrhoids 10/07/2013  . Gastric ulcer 10/07/2013    Seen on EGD 04/10/2006   . Seborrheic keratosis 10/07/2013  . Essential hypertension 08/03/2013    had been on Lisinopril but stopped per MD  . Tubular adenoma of colon   . Cataract     right but immature   Current Outpatient Prescriptions  Medication Sig Dispense Refill  . aspirin EC 325 MG tablet Take 1 tablet (325 mg total) by mouth daily. 90 tablet 3  . atorvastatin (LIPITOR) 40 MG tablet Take 1 tablet (40 mg total)  by mouth daily. 90 tablet 3  . carvedilol (COREG) 12.5 MG tablet Take 1 tablet (12.5 mg total) by mouth 2 (two) times daily with a meal. 180 tablet 3  . [START ON 11/12/2014] lisinopril (PRINIVIL,ZESTRIL) 10 MG tablet Take 1 tablet (10 mg total) by mouth daily. 90 tablet 3   No current facility-administered medications for this visit.   Family History  Problem Relation Age of Onset  . Coronary artery disease Father 55    Died of myocardial infarction  . Bradycardia Mother 19    Requiring pacemeaker placement  . Coronary artery disease Brother 63    Died of myocardial infarction  . Obesity Daughter   . Healthy Son    History   Social History  . Marital Status: Married    Spouse Name: N/A  . Number of Children: N/A  . Years of Education: N/A   Social History Main Topics  . Smoking status: Former Smoker    Types: Cigarettes  . Smokeless tobacco: Former Systems developer     Comment: quit smoking about 41yrs ago  . Alcohol Use: Yes     Comment: no alcohol since 07  . Drug Use: No  . Sexual Activity: No   Other Topics Concern  . None   Social History Narrative   Lives [place] with wife and dog.  Former Teacher, early years/pre.  Review of Systems: Review of Systems  Constitutional: Negative for fever, chills, weight loss and malaise/fatigue.  Eyes: Negative for blurred vision.  Respiratory: Negative for cough and shortness of breath.   Cardiovascular: Negative for chest pain and leg swelling.  Gastrointestinal: Negative for heartburn and abdominal pain.  Genitourinary: Negative for dysuria.  Musculoskeletal: Negative for myalgias.  Neurological: Negative for dizziness and headaches.  Endo/Heme/Allergies: Negative for polydipsia.  Psychiatric/Behavioral: Negative for substance abuse.     Objective:  Physical Exam: Filed Vitals:   10/13/14 1000  BP: 143/73  Pulse: 67  Temp: 97.5 F (36.4 C)  TempSrc: Oral  Height: 5\' 6"  (1.676 m)  Weight: 191  lb 3.2 oz (86.728 kg)  SpO2: 95%  Physical Exam  Constitutional: He is well-developed, well-nourished, and in no distress. No distress.  HENT:  Head: Normocephalic and atraumatic.  Eyes: Conjunctivae are normal.  Cardiovascular: Normal rate, regular rhythm, normal heart sounds and intact distal pulses.   No murmur heard. Pulmonary/Chest: Effort normal and breath sounds normal. He has no wheezes. He has no rales.  Abdominal: Soft. Bowel sounds are normal. He exhibits no distension. There is no tenderness.  Musculoskeletal: He exhibits no edema.  Skin: Skin is warm and dry. He is not diaphoretic.  Psychiatric: Affect and judgment normal.  Nursing note and vitals reviewed.   Assessment & Plan:  Case discussed with Dr. Beryle Beams  Essential hypertension BP Readings from Last 3 Encounters:  10/13/14 143/73  07/28/14 137/83  02/17/14 118/73    Lab Results  Component Value Date   NA 142 05/31/2014   K 4.1 05/31/2014   CREATININE 0.98 05/31/2014    Assessment: Blood pressure control: mildly elevated Progress toward BP goal:  deteriorated Comments: Goal per PCP is <140/90  Plan: Medications:  Coreg 12.5mg  BID, will add lisinopril 10mg  daily Educational resources provided:   Self management tools provided:   Other plans: Check BMP today to update renal function, will have patient back in 1 month for recheck of BMP, if BP well controlled he can wait for follow up with Dr Eppie Gibson in 9 months.  Warned patient to stop taking lisinpril for a few days if diarrhea or vomiting and dehydration to avoid kidney injury.      Medications Ordered Meds ordered this encounter  Medications  . DISCONTD: lisinopril (PRINIVIL,ZESTRIL) 10 MG tablet    Sig: Take 1 tablet (10 mg total) by mouth daily.    Dispense:  30 tablet    Refill:  0  . lisinopril (PRINIVIL,ZESTRIL) 10 MG tablet    Sig: Take 1 tablet (10 mg total) by mouth daily.    Dispense:  90 tablet    Refill:  3   Other  Orders Orders Placed This Encounter  Procedures  . BMP with Estimated GFR ([id number])    Standing Status: Future     Number of Occurrences:      Standing Expiration Date: 11/28/2014  . BMP with Estimated GFR ([id number])

## 2014-10-13 NOTE — Patient Instructions (Signed)
General Instructions:  You can start taking lisinopril 10mg  once a day. Come back in 1 month for a lab recheck.  If blood pressure doing well no need to see Dr Eppie Gibson until 9 months.  If you need to call make an earlier appointment  Please bring your medicines with you each time you come to clinic.  Medicines may include prescription medications, over-the-counter medications, herbal remedies, eye drops, vitamins, or other pills.   Progress Toward Treatment Goals:  Treatment Goal 10/13/2014  Blood pressure deteriorated    Self Care Goals & Plans:  Self Care Goal 10/13/2014  Manage my medications take my medicines as prescribed; bring my medications to every visit  Eat healthy foods eat baked foods instead of fried foods; eat foods that are low in salt; drink diet soda or water instead of juice or soda  Be physically active find an activity I enjoy  Meeting treatment goals maintain the current self-care plan    No flowsheet data found.   Care Management & Community Referrals:  Referral 10/13/2014  Referrals made for care management support none needed  Referrals made to community resources -

## 2014-10-13 NOTE — Progress Notes (Signed)
Medicine attending: Medical history, presenting problems, physical findings, and medications, reviewed with Dr Erik Hoffman and I concur with his evaluation and management plan. 

## 2014-10-13 NOTE — Assessment & Plan Note (Signed)
BP Readings from Last 3 Encounters:  10/13/14 143/73  07/28/14 137/83  02/17/14 118/73    Lab Results  Component Value Date   NA 142 05/31/2014   K 4.1 05/31/2014   CREATININE 0.98 05/31/2014    Assessment: Blood pressure control: mildly elevated Progress toward BP goal:  deteriorated Comments: Goal per PCP is <140/90  Plan: Medications:  Coreg 12.5mg  BID, will add lisinopril 10mg  daily Educational resources provided:   Self management tools provided:   Other plans: Check BMP today to update renal function, will have patient back in 1 month for recheck of BMP, if BP well controlled he can wait for follow up with Dr Eppie Gibson in 9 months.  Warned patient to stop taking lisinpril for a few days if diarrhea or vomiting and dehydration to avoid kidney injury.

## 2014-11-13 ENCOUNTER — Other Ambulatory Visit (INDEPENDENT_AMBULATORY_CARE_PROVIDER_SITE_OTHER): Payer: Commercial Managed Care - HMO

## 2014-11-13 DIAGNOSIS — I10 Essential (primary) hypertension: Secondary | ICD-10-CM | POA: Diagnosis not present

## 2014-11-13 LAB — BASIC METABOLIC PANEL WITH GFR
BUN: 19 mg/dL (ref 6–23)
CO2: 23 mEq/L (ref 19–32)
Calcium: 9.1 mg/dL (ref 8.4–10.5)
Chloride: 105 mEq/L (ref 96–112)
Creat: 1.01 mg/dL (ref 0.50–1.35)
GFR, Est African American: 89 mL/min
GFR, Est Non African American: 77 mL/min
Glucose, Bld: 136 mg/dL — ABNORMAL HIGH (ref 70–99)
Potassium: 4.1 mEq/L (ref 3.5–5.3)
Sodium: 139 mEq/L (ref 135–145)

## 2014-12-12 ENCOUNTER — Encounter: Payer: Self-pay | Admitting: Internal Medicine

## 2014-12-12 ENCOUNTER — Ambulatory Visit (INDEPENDENT_AMBULATORY_CARE_PROVIDER_SITE_OTHER): Payer: Commercial Managed Care - HMO | Admitting: Internal Medicine

## 2014-12-12 VITALS — BP 126/75 | HR 73 | Temp 97.7°F | Ht 66.0 in | Wt 194.3 lb

## 2014-12-12 DIAGNOSIS — M25562 Pain in left knee: Secondary | ICD-10-CM

## 2014-12-12 DIAGNOSIS — M170 Bilateral primary osteoarthritis of knee: Secondary | ICD-10-CM | POA: Insufficient documentation

## 2014-12-12 NOTE — Progress Notes (Signed)
Patient ID: [name], male   DOB: 1946/05/02, 69 y.o.   MRN: [id number]   Subjective:   HPI: [name] is a 69 y.o. gentleman with past medical history below presents for an acute visit for left knee pain.  He states that he twisted his left knee 10 days ago and has been painful since then. He describes his pain as sharp, 7/10, constant, increased by walking, and relieved by rest. He has been able to ambulate with some difficulty. He has been trying left over pain medications at home including Aleve and Hydrocodone use with some relief. He would like a referral to his orthopedic surgeon at [facility]. Patient has extensive history of arthritis requiring surgery on both his shoulders and right knee replacement.  No other complaints today.   Past Medical History  Diagnosis Date  . Coronary artery disease 10/02/2009    Cardiac cath (October 2007): 20% left main, 60% mid LAD, 60-70% distal LAD, 30-40% first diagonal, 30% circumflex, 40% OM, 30-40% RCA   . Aortic stenosis 09/08/2011    With mild aortic regurgitation, mean gradient 13 mmHg   . Paroxysmal atrial fibrillation 07/14/2006  . Hyperlipidemia 10/02/2009  . Erectile dysfunction 05/07/2009  . Degenerative joint disease of shoulder 08/03/2013    Bilateral, s/p right total shoulder arthroplasty 05/29/2011 and left shoulder arthroplasty 06/08/2014  . Sciatica associated with disorder of lumbar spine 08/03/2013    Anterolisthesis with right L 4-5 nerve root compression.  Treated with epidural injections and Physical Therapy.   . Allergic rhinitis 08/08/2013    takes Loratadine daily   . Diverticulosis 10/07/2013    Seen on colonoscopy in 2007   . Hemorrhoids 10/07/2013  . Gastric ulcer 10/07/2013    Seen on EGD 04/10/2006   . Seborrheic keratosis 10/07/2013  . Essential hypertension 08/03/2013    had been on Lisinopril but stopped per MD  . Tubular adenoma of colon   . Cataract     right but immature     ROS: Constitutional:  Denies fevers, chills, diaphoresis, appetite change and fatigue.  Respiratory: Denies SOB, DOE, cough, chest tightness, and wheezing.  CVS: No chest pain, palpitations and leg swelling.  GI: No abdominal pain, nausea, vomiting, bloody stools GU: No dysuria, frequency, hematuria, or flank pain.  MSK: No myalgias or back pain Psych: No depression symptoms. No SI or SA.    Objective:  Physical Exam: Filed Vitals:   12/12/14 1523  BP: 126/75  Pulse: 73  Temp: 97.7 F (36.5 C)  TempSrc: Oral  Height: 5\' 6"  (1.676 m)  Weight: 194 lb 4.8 oz (88.134 kg)  SpO2: 96%   General: Well nourished. No acute distress.  HEENT: Normal oral mucosa. MMM.  Lungs: CTA bilaterally. No wheezing. Heart: RRR; no extra sounds or murmurs  Abdomen: Non-distended, normal bowel sounds, soft, nontender; no hepatosplenomegaly  Extremities: No pedal edema. Left knee: Mild tenderness on deep palpation. No swelling or increased warmth. I don't appreciate crepitus. The knee does not exhibit instability. Range of motion is normal. Neurologic: Normal EOM,  Alert and oriented x3. No obvious neurologic/cranial nerve deficits.  Assessment & Plan:  Discussed case with Dr Lynnae January See problem based charting for assessment and plan.

## 2014-12-12 NOTE — Assessment & Plan Note (Signed)
Assessment: Most likely diagnosis posttraumatic knee pain in the setting of chronic degenerative joint disease. No knee joint instability. He has good range of motion and is able to ambulate. I do not suspect meniscus injuries.   Plan: 1. Labs/imaging: Deferred to orthopedics if needed 2. Therapy: Aleve 440 mg twice a day when necessary for not more than 2 weeks. Can also use Voltaren gel (he has some at home). Discussed side effects. Referral to orthopedic surgery per patient's request. 3. Follow up as needed

## 2014-12-12 NOTE — Patient Instructions (Signed)
Please use Aleve 2 pills every 12 hours as needed Please use voltaren gel and heat compression in the meantime I will refer you back to orthopedic Please come back if symptoms worsen

## 2014-12-15 NOTE — Progress Notes (Signed)
Internal Medicine Clinic Attending  Case discussed with Dr. Kazibwe soon after the resident saw the patient.  We reviewed the resident's history and exam and pertinent patient test results.  I agree with the assessment, diagnosis, and plan of care documented in the resident's note. 

## 2014-12-18 DIAGNOSIS — M25562 Pain in left knee: Secondary | ICD-10-CM | POA: Diagnosis not present

## 2015-01-15 DIAGNOSIS — Z471 Aftercare following joint replacement surgery: Secondary | ICD-10-CM | POA: Diagnosis not present

## 2015-01-15 DIAGNOSIS — M25562 Pain in left knee: Secondary | ICD-10-CM | POA: Diagnosis not present

## 2015-01-15 DIAGNOSIS — Z96612 Presence of left artificial shoulder joint: Secondary | ICD-10-CM | POA: Diagnosis not present

## 2015-03-21 ENCOUNTER — Ambulatory Visit: Payer: Commercial Managed Care - HMO

## 2015-03-21 DIAGNOSIS — Z23 Encounter for immunization: Secondary | ICD-10-CM

## 2015-05-16 DIAGNOSIS — M5136 Other intervertebral disc degeneration, lumbar region: Secondary | ICD-10-CM | POA: Diagnosis not present

## 2015-05-16 DIAGNOSIS — M4806 Spinal stenosis, lumbar region: Secondary | ICD-10-CM | POA: Diagnosis not present

## 2015-05-16 DIAGNOSIS — M546 Pain in thoracic spine: Secondary | ICD-10-CM | POA: Diagnosis not present

## 2015-05-16 DIAGNOSIS — M545 Low back pain: Secondary | ICD-10-CM | POA: Diagnosis not present

## 2015-05-18 ENCOUNTER — Encounter: Payer: Self-pay | Admitting: Internal Medicine

## 2015-05-18 ENCOUNTER — Ambulatory Visit (INDEPENDENT_AMBULATORY_CARE_PROVIDER_SITE_OTHER): Payer: Commercial Managed Care - HMO | Admitting: Internal Medicine

## 2015-05-18 VITALS — BP 124/85 | HR 72 | Temp 98.4°F | Wt 191.2 lb

## 2015-05-18 DIAGNOSIS — I10 Essential (primary) hypertension: Secondary | ICD-10-CM

## 2015-05-18 DIAGNOSIS — M170 Bilateral primary osteoarthritis of knee: Secondary | ICD-10-CM

## 2015-05-18 DIAGNOSIS — Z1159 Encounter for screening for other viral diseases: Secondary | ICD-10-CM | POA: Diagnosis not present

## 2015-05-18 DIAGNOSIS — E669 Obesity, unspecified: Secondary | ICD-10-CM | POA: Diagnosis not present

## 2015-05-18 DIAGNOSIS — Z0000 Encounter for general adult medical examination without abnormal findings: Secondary | ICD-10-CM

## 2015-05-18 DIAGNOSIS — D369 Benign neoplasm, unspecified site: Secondary | ICD-10-CM

## 2015-05-18 DIAGNOSIS — M5432 Sciatica, left side: Secondary | ICD-10-CM

## 2015-05-18 DIAGNOSIS — I480 Paroxysmal atrial fibrillation: Secondary | ICD-10-CM

## 2015-05-18 DIAGNOSIS — I2510 Atherosclerotic heart disease of native coronary artery without angina pectoris: Secondary | ICD-10-CM

## 2015-05-18 DIAGNOSIS — I350 Nonrheumatic aortic (valve) stenosis: Secondary | ICD-10-CM

## 2015-05-18 DIAGNOSIS — E785 Hyperlipidemia, unspecified: Secondary | ICD-10-CM

## 2015-05-18 LAB — GLUCOSE, CAPILLARY: Glucose-Capillary: 99 mg/dL (ref 65–99)

## 2015-05-18 LAB — POCT GLYCOSYLATED HEMOGLOBIN (HGB A1C): Hemoglobin A1C: 5.7

## 2015-05-18 NOTE — Progress Notes (Signed)
   Subjective:    Patient ID: [name], male    DOB: 1946-01-09, 69 y.o.   MRN: [id number]  HPI  [name] is here for follow-up of sciatica, knee osteoarthritis, CAD, HTN, and paroxysmal atrial fibrillation. Please see the A&P for the status of the pt's chronic medical problems.  Review of Systems  Constitutional: Negative for activity change, appetite change and unexpected weight change.  Respiratory: Negative for chest tightness, shortness of breath and wheezing.   Cardiovascular: Negative for chest pain, palpitations and leg swelling.  Gastrointestinal: Negative for nausea, vomiting, abdominal pain, diarrhea, constipation and abdominal distention.  Musculoskeletal: Positive for myalgias and back pain. Negative for joint swelling and arthralgias.       Left knee pain resolved after a steroid injection.  He has occasional myalgias that are transient and focal.  Sciatica down left leg has resolved with steroid dose pack.  Neurological: Negative for dizziness, syncope, weakness and light-headedness.      Objective:   Physical Exam  Constitutional: He is oriented to person, place, and time. He appears well-developed and well-nourished. No distress.  HENT:  Head: Normocephalic and atraumatic.  Eyes: Conjunctivae are normal. Right eye exhibits no discharge. Left eye exhibits no discharge. No scleral icterus.  Cardiovascular: Normal rate and regular rhythm.  Exam reveals no gallop and no friction rub.   Murmur heard. Systolic crescendo-decresendo heart murmur best heard at the bilateral upper sternal boarders  Pulmonary/Chest: Effort normal and breath sounds normal. No respiratory distress. He has no wheezes. He has no rales.  Abdominal: Soft. Bowel sounds are normal. He exhibits no distension. There is no tenderness. There is no rebound and no guarding.  Musculoskeletal: Normal range of motion. He exhibits no edema or tenderness.  Neurological: He is alert and oriented to  person, place, and time. He exhibits normal muscle tone.  Skin: Skin is warm and dry. He is not diaphoretic.  Numerous AKs and SKs  Psychiatric: He has a normal mood and affect. His behavior is normal. Judgment and thought content normal.  Nursing note and vitals reviewed.     Assessment & Plan:   Please see problem-oriented charting.

## 2015-05-18 NOTE — Assessment & Plan Note (Signed)
The Zostavax remains prohibitively expensive at this time. We will reassess the costs at the follow-up visit to see if it may be something he can afford. He is also due for hepatitis C screen. This test was ordered and is pending at the time of this dictation. He is otherwise up-to-date on his health care maintenance.

## 2015-05-18 NOTE — Assessment & Plan Note (Signed)
Assessment  He has lost 1 pound since the last clinic visit. He was praised for this weight loss. His mother has diabetes although she is 95. He denies any symptoms consistent with uncontrolled diabetes such as polyuria, polydipsia, or blurry vision. Nonetheless, given the elevated glucose on the last random basic metabolic panel that he had at the last clinic visit he may benefit from assessment for diabetes.  Plan  The hemoglobin A1c obtained today was 5.7 which suggests he does not have diabetes. Nonetheless, he was encouraged to continue with further weight loss and activity as tolerated.

## 2015-05-18 NOTE — Assessment & Plan Note (Signed)
Assessment  He recently developed some left knee pain felt to be secondary to osteoarthritis. He was seen by Dr. Tonita Cong in orthopedic surgery who performed a steroid injection into the left knee. This resolved his pain in his knee and he currently is without complaints.  Plan  We will continue to follow him symptomatically but at this point without any pain,  no further intervention is necessary other than encouraging weight loss.

## 2015-05-18 NOTE — Assessment & Plan Note (Signed)
Assessment  He's had no symptoms of paroxysmal atrial fibrillation including palpitations. His CHADS-Vasc score is 2.  Plan  We will continue the aspirin at full dose of 325 mg by mouth daily given the CHADS-Vasc score less than 3. We will also continue the carvedilol at 12.5 mg by mouth twice daily in case he were to flip back into atrial fibrillation. We will reassess for symptoms at the follow-up visit.

## 2015-05-18 NOTE — Patient Instructions (Signed)
It was good to see you again. You are doing a very good job with your health.  Congrats on the 1 pound weight loss.  Keep it up and it will pay off.  1) I will send the referral letter to Dr. Maxie Better for the sciatica.  2) We checked a hepatitis C test and a diabetes test.  I will call you if there is any problem with it.  3) Keep taking the medications as you are.  4) Call me if you change your mind about the colonoscopy.  I will see you back in 1 year.  Sooner if necessary.

## 2015-05-18 NOTE — Assessment & Plan Note (Signed)
Assessment  He has a history of prior tubular adenoma which was excised endoscopically. This was in 2007. Although he is overdue for surveillance colonoscopy he remains uninterested in this assessment at this time.  Plan  He was asked to call should he change his mind regarding the surveillance colonoscopy to follow-up on the tubular adenoma that was excised in 2007.

## 2015-05-18 NOTE — Assessment & Plan Note (Signed)
Assessment  He's had occasional transient nonfocal myalgias. I do not believe these are secondary to his atorvastatin. Therefore, I believe he is tolerating the atorvastatin well.  Plan  We will continue the atorvastatin at 40 mg by mouth daily and reassess for myalgias or other intolerances of the statin therapy at the follow-up visit.

## 2015-05-18 NOTE — Assessment & Plan Note (Signed)
Assessment  He had an exacerbation of his sciatica with radiation down his left leg. He was seen by Dr. Tonita Cong of orthopedic surgery who initiated a steroid Dosepak. With this therapy his symptoms have resolved.  Plan  He is to follow-up with Dr. Tonita Cong later this month. If his sciatica were to return in the interim he may be a candidate for epidural steroid injection which is helped with his symptoms in the past. Dr. Tonita Cong requires a new referral and this will be made.

## 2015-05-18 NOTE — Assessment & Plan Note (Signed)
Assessment  He has had no anginal symptoms with his coronary artery disease on the current regimen of aspirin 325 mg by mouth daily and carvedilol 12.5 mg by mouth twice daily.  Plan  We will continue the aspirin 325 mg by mouth daily and carvedilol at 12.5 mg by mouth twice daily. We will also continue his high intensity atorvastatin at 40 mg by mouth daily. Finally, we will continue to control the blood pressure with lisinopril at 10 mg by mouth daily. We will reassess for symptoms of angina at the follow-up visit.

## 2015-05-18 NOTE — Assessment & Plan Note (Signed)
Assessment  His blood pressure is well controlled today at 124/85. This is on carvedilol 12.5 mg by mouth twice daily and lisinopril 10 mg by mouth daily.  Plan  As he is tolerating both the carvedilol and lisinopril well and his blood pressure is at target we will continue the carvedilol 12.5 mg by mouth daily and lisinopril at 10 mg by mouth daily.

## 2015-05-18 NOTE — Assessment & Plan Note (Signed)
Assessment  His mild aortic stenosis remains asymptomatic. He's had no chest pain, symptoms consistent with heart failure, or syncope.  Plan  We will continue aggressive cardiovascular risk factor modification by treating his blood pressure with carvedilol and lisinopril and his hyperlipidemia with atorvastatin. We will reassess for symptoms suggestive of progressive aortic stenosis at the follow-up visit.

## 2015-05-19 LAB — HEPATITIS C ANTIBODY: Hep C Virus Ab: <0.1 s/co ratio (ref 0.0–0.9)

## 2015-05-22 NOTE — Progress Notes (Signed)
Hep C Ab Negative.

## 2015-05-30 DIAGNOSIS — M5416 Radiculopathy, lumbar region: Secondary | ICD-10-CM | POA: Diagnosis not present

## 2015-05-30 DIAGNOSIS — M4806 Spinal stenosis, lumbar region: Secondary | ICD-10-CM | POA: Diagnosis not present

## 2015-05-30 DIAGNOSIS — M545 Low back pain: Secondary | ICD-10-CM | POA: Diagnosis not present

## 2015-05-30 DIAGNOSIS — M5136 Other intervertebral disc degeneration, lumbar region: Secondary | ICD-10-CM | POA: Diagnosis not present

## 2015-06-09 DIAGNOSIS — M545 Low back pain: Secondary | ICD-10-CM | POA: Diagnosis not present

## 2015-06-15 DIAGNOSIS — M4806 Spinal stenosis, lumbar region: Secondary | ICD-10-CM | POA: Diagnosis not present

## 2015-06-15 DIAGNOSIS — M5416 Radiculopathy, lumbar region: Secondary | ICD-10-CM | POA: Diagnosis not present

## 2015-06-15 DIAGNOSIS — M545 Low back pain: Secondary | ICD-10-CM | POA: Diagnosis not present

## 2015-07-03 DIAGNOSIS — M5416 Radiculopathy, lumbar region: Secondary | ICD-10-CM | POA: Diagnosis not present

## 2015-07-04 ENCOUNTER — Other Ambulatory Visit: Payer: Self-pay | Admitting: Internal Medicine

## 2015-07-04 DIAGNOSIS — E785 Hyperlipidemia, unspecified: Secondary | ICD-10-CM

## 2015-07-04 DIAGNOSIS — I25119 Atherosclerotic heart disease of native coronary artery with unspecified angina pectoris: Secondary | ICD-10-CM

## 2015-07-04 NOTE — Telephone Encounter (Signed)
Pt requesting atorvastatin and carvedilol to be filled.

## 2015-07-05 MED ORDER — ATORVASTATIN CALCIUM 40 MG PO TABS: 40.0000 mg | ORAL_TABLET | Freq: Every day | ORAL | Status: DC

## 2015-07-05 MED ORDER — CARVEDILOL 12.5 MG PO TABS: 12.5000 mg | ORAL_TABLET | Freq: Two times a day (BID) | ORAL | Status: DC

## 2015-07-16 DIAGNOSIS — M4806 Spinal stenosis, lumbar region: Secondary | ICD-10-CM | POA: Diagnosis not present

## 2015-07-16 DIAGNOSIS — M5416 Radiculopathy, lumbar region: Secondary | ICD-10-CM | POA: Diagnosis not present

## 2015-07-16 DIAGNOSIS — M1712 Unilateral primary osteoarthritis, left knee: Secondary | ICD-10-CM | POA: Diagnosis not present

## 2015-07-16 DIAGNOSIS — M170 Bilateral primary osteoarthritis of knee: Secondary | ICD-10-CM | POA: Diagnosis not present

## 2015-07-16 DIAGNOSIS — M5136 Other intervertebral disc degeneration, lumbar region: Secondary | ICD-10-CM | POA: Diagnosis not present

## 2016-03-13 ENCOUNTER — Telehealth: Payer: Self-pay | Admitting: Internal Medicine

## 2016-03-13 NOTE — Telephone Encounter (Signed)
APT. REMINDER CALL, LMTCB °

## 2016-03-14 ENCOUNTER — Encounter: Payer: Self-pay | Admitting: Internal Medicine

## 2016-03-14 ENCOUNTER — Ambulatory Visit (HOSPITAL_COMMUNITY)
Admission: RE | Admit: 2016-03-14 | Discharge: 2016-03-14 | Disposition: A | Discharge status: Home / Self Care | Payer: Commercial Managed Care - HMO | Source: Ambulatory Visit | Attending: Internal Medicine | Admitting: Internal Medicine

## 2016-03-14 ENCOUNTER — Ambulatory Visit (INDEPENDENT_AMBULATORY_CARE_PROVIDER_SITE_OTHER): Payer: Commercial Managed Care - HMO | Admitting: Internal Medicine

## 2016-03-14 VITALS — BP 138/74 | Temp 97.5°F | Wt 195.3 lb

## 2016-03-14 DIAGNOSIS — Z79899 Other long term (current) drug therapy: Secondary | ICD-10-CM

## 2016-03-14 DIAGNOSIS — Z23 Encounter for immunization: Secondary | ICD-10-CM | POA: Diagnosis not present

## 2016-03-14 DIAGNOSIS — Z6831 Body mass index (BMI) 31.0-31.9, adult: Secondary | ICD-10-CM

## 2016-03-14 DIAGNOSIS — E785 Hyperlipidemia, unspecified: Secondary | ICD-10-CM

## 2016-03-14 DIAGNOSIS — I350 Nonrheumatic aortic (valve) stenosis: Secondary | ICD-10-CM | POA: Diagnosis not present

## 2016-03-14 DIAGNOSIS — Z86010 Personal history of colonic polyps: Secondary | ICD-10-CM

## 2016-03-14 DIAGNOSIS — I480 Paroxysmal atrial fibrillation: Secondary | ICD-10-CM | POA: Insufficient documentation

## 2016-03-14 DIAGNOSIS — I25119 Atherosclerotic heart disease of native coronary artery with unspecified angina pectoris: Secondary | ICD-10-CM

## 2016-03-14 DIAGNOSIS — D369 Benign neoplasm, unspecified site: Secondary | ICD-10-CM

## 2016-03-14 DIAGNOSIS — I25118 Atherosclerotic heart disease of native coronary artery with other forms of angina pectoris: Secondary | ICD-10-CM

## 2016-03-14 DIAGNOSIS — I10 Essential (primary) hypertension: Secondary | ICD-10-CM | POA: Diagnosis not present

## 2016-03-14 DIAGNOSIS — Z0000 Encounter for general adult medical examination without abnormal findings: Secondary | ICD-10-CM

## 2016-03-14 DIAGNOSIS — M170 Bilateral primary osteoarthritis of knee: Secondary | ICD-10-CM | POA: Diagnosis not present

## 2016-03-14 DIAGNOSIS — Z7982 Long term (current) use of aspirin: Secondary | ICD-10-CM

## 2016-03-14 DIAGNOSIS — I440 Atrioventricular block, first degree: Secondary | ICD-10-CM

## 2016-03-14 DIAGNOSIS — E669 Obesity, unspecified: Secondary | ICD-10-CM

## 2016-03-14 HISTORY — DX: Atrioventricular block, first degree: I44.0

## 2016-03-14 NOTE — Assessment & Plan Note (Signed)
He received the flu vaccination as well as T dap today. At the follow-up visit we will provide him with the Pneumovax 23 to complete the series. He is otherwise up-to-date on his preventative health care maintenance.

## 2016-03-14 NOTE — Assessment & Plan Note (Signed)
Assessment  We discussed the guidelines that he should have surveillance colonoscopy every 5 years. He still remains uninterested at this time.  Plan  I will rediscuss the issue and educate him on the importance of surveillance colonoscopy with a history of tubular adenoma when he is seen at his follow-up visit.

## 2016-03-14 NOTE — Assessment & Plan Note (Signed)
Assessment  His blood pressure today was at target at 138/74. This is on carvedilol 12.5 mg by mouth twice daily.  Plan  We will continue carvedilol at 12.5 mg by mouth twice daily and reassess his blood pressure at the follow-up visit.

## 2016-03-14 NOTE — Patient Instructions (Signed)
It was great to see you again.  I am glad you are feeling well.  1) Keep taking the medications as you are.  2) We gave you a tetanus booster and flu vaccination today.  3) We checked your ECG today.  4) I gave you a left knee injection for your knee pain.  5)  I will order an Echocardiogram to check your aortic valve.  6) I will order ABIs to check the pressures in your legs.

## 2016-03-14 NOTE — Assessment & Plan Note (Signed)
Assessment  He is up 4 pounds to 195 pounds since the last visit 9 months ago. He admits to dietary indiscretions. He is interested in trying to lose more weight and knows he needs to cut down on his portion sizes.  Plan  He was encouraged to cut down on his portion sizes as he has recognized this as being part of the problem. He was praised for his desire to continue to lose weight. We will reassess the success of weight loss at the follow-up visit.

## 2016-03-14 NOTE — Assessment & Plan Note (Addendum)
Assessment  He denies any chest pain on his current regimen that includes carvedilol 12.5 mg twice daily, aspirin 325 mg daily, and atorvastatin 40 mg by mouth daily. He is tolerating this regimen well. He does note some cramping in both calves, left greater than right, when exercising. Examination revealed 1+ dorsalis pedis with no palpable posterior tibialis on the right and 1+ posterior tibialis on the left with no palpable dorsalis pedis. This could represent peripheral vascular occlusive disease given his underlying coronary artery disease.  Plan  We will continue the carvedilol at 12.5 mg by mouth twice daily, aspirin at 325 mg by mouth daily, and atorvastatin at 40 mg by mouth daily. We will reassess for signs and symptoms suggestive of active coronary artery disease with angina at the follow-up visit. We will obtain ankle-brachial indices of the lower extremities to assess for evidence of significant peripheral vascular occlusive disease it may explain the potential claudication.

## 2016-03-14 NOTE — Assessment & Plan Note (Signed)
Assessment  He is tolerating the atorvastatin at 40 mg by mouth daily well without myalgias or other symptoms.  Plan  We will continue the atorvastatin 40 mg by mouth daily and reassess for intolerances at the follow-up visit.

## 2016-03-14 NOTE — Progress Notes (Signed)
   Subjective:    Patient ID: [name], male    DOB: 01/16/1946, 70 y.o.   MRN: [id number]  HPI  [name] is here for follow-up of his coronary artery disease, aortic stenosis, hypertension, and paroxysmal atrial fibrillation. Please see the A&P for the status of the pt's chronic medical problems.  Review of Systems  Constitutional: Negative for activity change, appetite change and unexpected weight change.  Respiratory: Negative for chest tightness, shortness of breath and wheezing.   Cardiovascular: Negative for chest pain, palpitations and leg swelling.  Gastrointestinal: Negative for abdominal distention, abdominal pain, constipation, diarrhea, nausea and vomiting.  Musculoskeletal: Positive for arthralgias. Negative for joint swelling and myalgias.  Skin: Negative for color change, rash and wound.      Objective:   Physical Exam  Constitutional: He is oriented to person, place, and time. He appears well-developed and well-nourished. No distress.  HENT:  Head: Normocephalic and atraumatic.  Eyes: Conjunctivae are normal. Right eye exhibits no discharge. Left eye exhibits no discharge. No scleral icterus.  Cardiovascular: Normal rate and regular rhythm.  Exam reveals no gallop and no friction rub.   Murmur heard. Pulmonary/Chest: Effort normal and breath sounds normal. No respiratory distress. He has no wheezes. He has no rales.  Abdominal: Soft. Bowel sounds are normal. He exhibits no distension. There is no tenderness. There is no rebound and no guarding.  Musculoskeletal: Normal range of motion. He exhibits no edema or tenderness.  Neurological: He is alert and oriented to person, place, and time. He exhibits normal muscle tone.  Skin: Skin is warm and dry. He is not diaphoretic. No erythema.  Psychiatric: He has a normal mood and affect. His behavior is normal. Judgment and thought content normal.  Nursing note and vitals reviewed.     Assessment & Plan:    Please see problem oriented charting.

## 2016-03-14 NOTE — Assessment & Plan Note (Signed)
Assessment  He remains asymptomatic with regards to his aortic stenosis. It has been 2 years since he's had an echocardiogram to assess the status of his aortic stenosis.  Plan  An echocardiogram was ordered to assess his aortic stenosis. In the meantime, we will continue with aggressive risk factor modification for his hypertension and hyperlipidemia.

## 2016-03-14 NOTE — Assessment & Plan Note (Signed)
Assessment  He continues to note pain in both knees. At this point the left is worse than the right. He has taken over-the-counter Naprosyn without much relief and is interested in a steroid injection which has been very helpful in the past.  Plan  Procedure note  We discussed the risks and benefits of a left knee injection and the alternatives. He chose to proceed with the left knee injection and signed informed consent was obtained. After timeout his left knee was prepped in the usual sterile fashion and the skin overlying the insertion site was numbed with freeze spray. A one and a half inch 27-gauge needle was used to enter the left knee joint under the medial inferior aspect of the patella. 1 mL of 40 mg per mL of Kenalog and 1 mL of 1% lidocaine was injected into the knee without difficulty. The patient tolerated the procedure well. At the end of the procedure he felt his knee was improved.  We will reassess the success of this steroid injection at maintaining control over his knee osteoarthritis pain. He will call the office if he would like [place] to inject the right knee at some point. Continued weight loss will help the knees, and again this was stressed.

## 2016-03-14 NOTE — Assessment & Plan Note (Addendum)
Assessment  He has had no signs or symptoms consistent with recurrent or excisional atrial fibrillation. This is on carvedilol 12.5 mg by mouth twice daily. He also takes aspirin 325 mg by mouth daily. An EKG today demonstrated normal sinus rhythm with first-degree AV block that was unchanged from the prior ECG on 07/28/2014.  Plan  We will continue with the carvedilol at 12.5 mg by mouth twice daily and aspirin at 325 mg by mouth daily. We will reassess for symptoms of paroxysmal atrial fibrillation at the follow-up visit.

## 2016-04-04 ENCOUNTER — Encounter: Payer: Self-pay | Admitting: Internal Medicine

## 2016-04-04 ENCOUNTER — Ambulatory Visit (HOSPITAL_BASED_OUTPATIENT_CLINIC_OR_DEPARTMENT_OTHER)
Admission: RE | Admit: 2016-04-04 | Discharge: 2016-04-04 | Disposition: A | Discharge status: Home / Self Care | Payer: Commercial Managed Care - HMO | Source: Ambulatory Visit | Attending: Internal Medicine | Admitting: Internal Medicine

## 2016-04-04 ENCOUNTER — Ambulatory Visit (HOSPITAL_COMMUNITY)
Admission: RE | Admit: 2016-04-04 | Discharge: 2016-04-04 | Disposition: A | Discharge status: Home / Self Care | Payer: Commercial Managed Care - HMO | Source: Ambulatory Visit | Attending: Internal Medicine | Admitting: Internal Medicine

## 2016-04-04 DIAGNOSIS — I25119 Atherosclerotic heart disease of native coronary artery with unspecified angina pectoris: Secondary | ICD-10-CM | POA: Diagnosis not present

## 2016-04-04 DIAGNOSIS — I350 Nonrheumatic aortic (valve) stenosis: Secondary | ICD-10-CM | POA: Diagnosis not present

## 2016-04-04 DIAGNOSIS — I082 Rheumatic disorders of both aortic and tricuspid valves: Secondary | ICD-10-CM | POA: Insufficient documentation

## 2016-04-04 DIAGNOSIS — I5189 Other ill-defined heart diseases: Secondary | ICD-10-CM | POA: Insufficient documentation

## 2016-04-04 HISTORY — DX: Other ill-defined heart diseases: I51.89

## 2016-04-04 LAB — ECHOCARDIOGRAM COMPLETE
E decel time: 268 msec
E/e' ratio: 9.6
FS: 48 % — AB (ref 28–44)
IVS/LV PW RATIO, ED: 1.09
LA ID, A-P, ES: 42 mm
LA diam end sys: 42 mm
LA diam index: 2.12 cm/m2
LA vol A4C: 76.8 ml
LA vol index: 36.4 mL/m2
LA vol: 72.1 mL
LV E/e' medial: 9.6
LV E/e'average: 9.6
LV PW d: 10.6 mm — AB (ref 0.6–1.1)
LV e' LATERAL: 5 cm/s
LVOT area: 3.14 cm2
LVOT diameter: 20 mm
Lateral S' vel: 13.5 cm/s
MV Dec: 268
MV pk A vel: 81 m/s
MV pk E vel: 48 m/s
RV sys press: 30 mmHg
Reg peak vel: 259 cm/s
TAPSE: 18.5 mm
TDI e' lateral: 5
TDI e' medial: 5
TR max vel: 259 cm/s

## 2016-04-04 NOTE — Progress Notes (Signed)
VASCULAR LAB PRELIMINARY  ARTERIAL  ABI completed:    RIGHT    LEFT    PRESSURE WAVEFORM  PRESSURE WAVEFORM  BRACHIAL 157 Triphasic BRACHIAL 154 Triphasic  DP 153 Triphasic DP 163 Biphasic  PT 140 Triphasic PT 145 Triphasic    RIGHT LEFT  ABI 0.97 1.04   ABIs and Doppler waveforms are within normal limits.  Kai Railsback, RVT 04/04/2016, 11:14 AM

## 2016-04-04 NOTE — Progress Notes (Signed)
  Echocardiogram 2D Echocardiogram has been performed.  [name] 04/04/2016, 10:21 AM

## 2016-04-21 ENCOUNTER — Other Ambulatory Visit: Payer: Self-pay | Admitting: Internal Medicine

## 2016-04-21 DIAGNOSIS — E785 Hyperlipidemia, unspecified: Secondary | ICD-10-CM

## 2016-04-21 DIAGNOSIS — I25119 Atherosclerotic heart disease of native coronary artery with unspecified angina pectoris: Secondary | ICD-10-CM

## 2016-09-01 ENCOUNTER — Telehealth: Payer: Self-pay | Admitting: *Deleted

## 2016-09-01 NOTE — Telephone Encounter (Signed)
Call from patient stating he has been having left knee pain for about 2wks,  He received steroid injection on 03/14/2016 by his pcp.  He did get some relief, but now the pain has returned.  Pt requesting another injection.  No avail appts with pcp, will have pt evaluated in [facility] for left knee pain and steroid injection if appropriate. [name], Michelle Wnek Cassady3/5/20189:57 AM

## 2016-09-02 ENCOUNTER — Encounter: Payer: Self-pay | Admitting: Internal Medicine

## 2016-09-02 ENCOUNTER — Ambulatory Visit (INDEPENDENT_AMBULATORY_CARE_PROVIDER_SITE_OTHER): Payer: Medicare HMO | Admitting: Internal Medicine

## 2016-09-02 VITALS — BP 138/79 | HR 71 | Temp 98.2°F | Ht 66.0 in | Wt 192.3 lb

## 2016-09-02 DIAGNOSIS — M170 Bilateral primary osteoarthritis of knee: Secondary | ICD-10-CM | POA: Diagnosis not present

## 2016-09-02 NOTE — Patient Instructions (Signed)
Good to see you. I hope you feel better. Call us if you need anything.  Make appt to see Dr. Eppie Gibson in 1-2 months.

## 2016-09-02 NOTE — Progress Notes (Signed)
I saw and evaluated the patient. I personally confirmed the key portions of Dr. Brandt Loosen history and exam and reviewed pertinent patient test results. The assessment, diagnosis, and plan were formulated together and I agree with the documentation in the resident's note.  I was at the patient's side and supervised the entire procedure.  He tolerated it well without immediate complications.

## 2016-09-02 NOTE — Progress Notes (Signed)
   CC: left knee pain   HPI:  [name] is a 71 y.o. with PMH as listed below is here for evaluation of left knee pain   Past Medical History:  Diagnosis Date  . Allergic rhinitis 08/08/2013   takes Loratadine daily   . Aortic stenosis 09/08/2011   With mild aortic regurgitation, mean gradient 13 mmHg   . Cataract    right but immature  . Coronary artery disease 10/02/2009   Cardiac cath (October 2007): 20% left main, 60% mid LAD, 60-70% distal LAD, 30-40% first diagonal, 30% circumflex, 40% OM, 30-40% RCA   . Degenerative joint disease of shoulder 08/03/2013   Bilateral, s/p right total shoulder arthroplasty 05/29/2011 and left shoulder arthroplasty 06/08/2014  . Diastolic dysfunction [id number]   Echo (04/04/2016): Grade I  . Diverticulosis 10/07/2013   Seen on colonoscopy in 2007   . Erectile dysfunction 05/07/2009  . Essential hypertension 08/03/2013   had been on Lisinopril but stopped per MD  . First degree AV block 03/14/2016  . Gastric ulcer 10/07/2013   Seen on EGD 04/10/2006   . Hemorrhoids 10/07/2013  . Hyperlipidemia 10/02/2009  . Paroxysmal atrial fibrillation ([place]) 07/14/2006  . Sciatica associated with disorder of lumbar spine 08/03/2013   Anterolisthesis with right L 4-5 nerve root compression.  Treated with epidural injections and Physical Therapy.   . Seborrheic keratosis 10/07/2013  . Tubular adenoma of colon    Has chronic knee pain from b/l OA. received last steroid injection 03/14/2016 with relief. He usually gets shots every 6 months. Presents for left knee pain starting 2 weeks ago after going to a fishing trip with his son. Hurts to walk and put weight on the left knee.   No recent imaging of the knee.   Had right knee artrhscopy with minscal repair.  Review of Systems:   Review of Systems  Constitutional: Negative for chills and fever.  Cardiovascular: Negative for chest pain and palpitations.  Musculoskeletal: Positive for joint pain.  Neurological:  Negative for dizziness and headaches.     Physical Exam:  Vitals:   09/02/16 1407  BP: 138/79  Pulse: 71  Temp: 98.2 F (36.8 C)  TempSrc: Oral  SpO2: 100%  Weight: 192 lb 4.8 oz (87.2 kg)  Height: 5\' 6"  (1.676 m)   Physical Exam  Constitutional: He is oriented to person, place, and time. He appears well-developed and well-nourished.  HENT:  Head: Normocephalic and atraumatic.  Respiratory: Effort normal and breath sounds normal. No respiratory distress. He has no wheezes.  Musculoskeletal:  Left knee without effusion or erythema or tenderness. Has good ROM. Has bony prominence.   Neurological: He is alert and oriented to person, place, and time.    Assessment & Plan:   See Encounters Tab for problem based charting.  Patient discussed with Dr. Eppie Gibson

## 2016-09-02 NOTE — Assessment & Plan Note (Signed)
Having flare up of his OA related pain on left knee. We performed steroid injection today, last injection 02/2016 with good relief.   Procedure:  Name: left knee steroid injection Indication: left knee pain.  Patient was consented. Time out was called.  Area was marked then cleaned and prepped with beta diene solution.  Pain Ease was used for skin anesthesia. 2 cc 1% lidocaine + 1 cc of 200mg /49ml kenalog (40mg  total) were mixed together and injected into the left knee joint space. Patient tolerated the procedure well.   -cont usual activity. Naproxen prn for pain control. F/up as needed.

## 2016-09-30 ENCOUNTER — Telehealth: Payer: Self-pay | Admitting: Internal Medicine

## 2016-09-30 DIAGNOSIS — M170 Bilateral primary osteoarthritis of knee: Secondary | ICD-10-CM | POA: Diagnosis not present

## 2016-09-30 DIAGNOSIS — M25562 Pain in left knee: Secondary | ICD-10-CM | POA: Diagnosis not present

## 2016-09-30 DIAGNOSIS — M25561 Pain in right knee: Secondary | ICD-10-CM | POA: Diagnosis not present

## 2016-09-30 NOTE — Telephone Encounter (Signed)
[facility] called (Dr. Bobetta Lime)  and requested a Referral be placed at the patient's request to be seen. Per patient's [employer] it requires authorization  Patient's LOV with our clinic was 09/02/2016 in the [facility] for knee pain. Please advise as patient would like a second opinion rather than using his current Ortho Office.

## 2016-09-30 NOTE — Telephone Encounter (Signed)
A New Ortho Referral needs to be entered and I will do Authorization on line and fax it to that office.

## 2016-09-30 NOTE — Telephone Encounter (Signed)
He briefly mentioned an alternative he was looking to explore when last seen.  He was going to get [place] more info.  What do we need to do for authorization?  Thanks.

## 2016-10-07 DIAGNOSIS — M1712 Unilateral primary osteoarthritis, left knee: Secondary | ICD-10-CM | POA: Diagnosis not present

## 2016-10-07 DIAGNOSIS — M25562 Pain in left knee: Secondary | ICD-10-CM | POA: Diagnosis not present

## 2016-10-09 DIAGNOSIS — M1711 Unilateral primary osteoarthritis, right knee: Secondary | ICD-10-CM | POA: Diagnosis not present

## 2016-10-09 DIAGNOSIS — M25561 Pain in right knee: Secondary | ICD-10-CM | POA: Diagnosis not present

## 2016-10-14 DIAGNOSIS — M25562 Pain in left knee: Secondary | ICD-10-CM | POA: Diagnosis not present

## 2016-10-14 DIAGNOSIS — M1712 Unilateral primary osteoarthritis, left knee: Secondary | ICD-10-CM | POA: Diagnosis not present

## 2016-10-16 DIAGNOSIS — M25561 Pain in right knee: Secondary | ICD-10-CM | POA: Diagnosis not present

## 2016-10-16 DIAGNOSIS — M1711 Unilateral primary osteoarthritis, right knee: Secondary | ICD-10-CM | POA: Diagnosis not present

## 2016-10-16 DIAGNOSIS — M1712 Unilateral primary osteoarthritis, left knee: Secondary | ICD-10-CM | POA: Diagnosis not present

## 2016-10-16 DIAGNOSIS — M25562 Pain in left knee: Secondary | ICD-10-CM | POA: Diagnosis not present

## 2016-10-16 DIAGNOSIS — R262 Difficulty in walking, not elsewhere classified: Secondary | ICD-10-CM | POA: Diagnosis not present

## 2016-10-21 DIAGNOSIS — M25561 Pain in right knee: Secondary | ICD-10-CM | POA: Diagnosis not present

## 2016-10-21 DIAGNOSIS — M25562 Pain in left knee: Secondary | ICD-10-CM | POA: Diagnosis not present

## 2016-10-21 DIAGNOSIS — M170 Bilateral primary osteoarthritis of knee: Secondary | ICD-10-CM | POA: Diagnosis not present

## 2017-01-28 DIAGNOSIS — H524 Presbyopia: Secondary | ICD-10-CM | POA: Diagnosis not present

## 2017-04-01 DIAGNOSIS — M170 Bilateral primary osteoarthritis of knee: Secondary | ICD-10-CM | POA: Diagnosis not present

## 2017-04-09 ENCOUNTER — Encounter: Payer: Self-pay | Admitting: Internal Medicine

## 2017-05-08 ENCOUNTER — Ambulatory Visit (HOSPITAL_COMMUNITY)
Admission: RE | Admit: 2017-05-08 | Discharge: 2017-05-08 | Disposition: A | Discharge status: Home / Self Care | Payer: Medicare HMO | Source: Ambulatory Visit | Attending: Internal Medicine | Admitting: Internal Medicine

## 2017-05-08 ENCOUNTER — Ambulatory Visit (INDEPENDENT_AMBULATORY_CARE_PROVIDER_SITE_OTHER): Payer: Medicare HMO | Admitting: Internal Medicine

## 2017-05-08 ENCOUNTER — Encounter: Payer: Self-pay | Admitting: Internal Medicine

## 2017-05-08 VITALS — BP 149/82 | HR 62 | Wt 188.8 lb

## 2017-05-08 DIAGNOSIS — I350 Nonrheumatic aortic (valve) stenosis: Secondary | ICD-10-CM | POA: Diagnosis not present

## 2017-05-08 DIAGNOSIS — D369 Benign neoplasm, unspecified site: Secondary | ICD-10-CM

## 2017-05-08 DIAGNOSIS — E669 Obesity, unspecified: Secondary | ICD-10-CM

## 2017-05-08 DIAGNOSIS — M170 Bilateral primary osteoarthritis of knee: Secondary | ICD-10-CM | POA: Diagnosis not present

## 2017-05-08 DIAGNOSIS — I25118 Atherosclerotic heart disease of native coronary artery with other forms of angina pectoris: Secondary | ICD-10-CM

## 2017-05-08 DIAGNOSIS — E7800 Pure hypercholesterolemia, unspecified: Secondary | ICD-10-CM

## 2017-05-08 DIAGNOSIS — I480 Paroxysmal atrial fibrillation: Secondary | ICD-10-CM

## 2017-05-08 DIAGNOSIS — Z6830 Body mass index (BMI) 30.0-30.9, adult: Secondary | ICD-10-CM | POA: Diagnosis not present

## 2017-05-08 DIAGNOSIS — I10 Essential (primary) hypertension: Secondary | ICD-10-CM | POA: Diagnosis not present

## 2017-05-08 DIAGNOSIS — Z23 Encounter for immunization: Secondary | ICD-10-CM

## 2017-05-08 NOTE — Assessment & Plan Note (Signed)
He was given the Pneumovax 23 and flu vaccination today. Other than the colonoscopy for surveillance that he defers he is up-to-date on his general health maintenance.

## 2017-05-08 NOTE — Progress Notes (Signed)
   Subjective:    Patient ID: [name], male    DOB: 1945-12-03, 71 y.o.   MRN: [id number]  HPI  [name] is here for follow-up of his coronary artery disease with stable angina, essential hypertension, degenerative osteoarthritis of his left knee, obesity, hyperlipidemia, and pre-operative assessment. Please see the A&P for the status of the pt's chronic medical problems.  Review of Systems  Constitutional: Negative for activity change, appetite change and unexpected weight change.  Respiratory: Negative for chest tightness, shortness of breath and wheezing.   Cardiovascular: Negative for chest pain, palpitations and leg swelling.  Gastrointestinal: Negative for abdominal distention, abdominal pain, constipation, diarrhea, nausea and vomiting.  Musculoskeletal: Positive for arthralgias. Negative for back pain, gait problem, joint swelling and myalgias.  Skin: Negative for rash and wound.      Objective:   Physical Exam  Constitutional: He is oriented to person, place, and time. He appears well-developed and well-nourished. No distress.  HENT:  Head: Normocephalic and atraumatic.  Eyes: Conjunctivae are normal. Right eye exhibits no discharge. Left eye exhibits no discharge. No scleral icterus.  Cardiovascular: Normal rate and normal heart sounds. Exam reveals no gallop and no friction rub.  No murmur heard. Regularly irregular  Pulmonary/Chest: Effort normal and breath sounds normal. No respiratory distress. He has no wheezes. He has no rales.  Abdominal: Soft. Bowel sounds are normal. He exhibits no distension. There is no tenderness. There is no rebound and no guarding.  Musculoskeletal: Normal range of motion. He exhibits no edema, tenderness or deformity.  Neurological: He is alert and oriented to person, place, and time. He exhibits normal muscle tone.  Skin: Skin is warm and dry. No rash noted. He is not diaphoretic. No erythema.  Psychiatric: He has a normal  mood and affect. His behavior is normal. Judgment and thought content normal.  Nursing note and vitals reviewed.     Assessment & Plan:   Please see problem oriented charting.

## 2017-05-08 NOTE — Assessment & Plan Note (Signed)
Assessment  He is tolerated the atorvastatin 40 mg by mouth daily without myalgias.  Plan  We will continue the high intensity statin and reassess for intolerances at the follow-up visit.

## 2017-05-08 NOTE — Assessment & Plan Note (Signed)
Assessment  His left knee has been giving him much more difficulty than his right knee. He's been seen by orthopedic surgery and is scheduled for a left total knee replacement. He asked me for my thoughts on a second opinion. I told him that if he was uncomfortable and it would help his peace of mind I thought it was a good idea. His pain is somewhat responsive to over-the-counter Naprosyn 2 tablets every 12 hours as needed. There has been no falls, nor joint swelling.  An ECG was obtained today and revealed sinus bigeminy with first-degree AV block at 69 bpm, normal axis, no significant Q waves, no LVH by voltage, good R wave progression, no ST segment changes and lateral T wave flattening unchanged from the previous ECG on 03/14/2016.  Plan  He will let me know if he wants referral for a second opinion. At this point he is likely to move forward as scheduled. I have completed the preoperative form his surgeon had requested from him and returned it to Mr. Caylor to give to the surgeon at the follow-up visit. Following the [facility] guidelines he has no major clinical predictors or prior intermediate clinical predictors. He has an abnormal ECG with atrial bigeminy and first-degree AV block but has no other minor clinical predictors. Given his excellent functional capacity an intermediate risk surgical procedure it is felt safe that he proceed to the operating room. I asked that his beta blocker be continued perioperatively and that the aspirin be restarted as soon is possible from a surgical standpoint. In the meantime he will continue the as needed Naprosyn.

## 2017-05-08 NOTE — Assessment & Plan Note (Signed)
Assessment  He has had no chest pain at rest or with exertion.  Plan  We will continue his current antianginal regimen that includes carvedilol 12.5 mg by mouth twice daily and aspirin 325 mg by mouth daily. We will reassess for evidence of angina at the follow-up visit.

## 2017-05-08 NOTE — Patient Instructions (Signed)
It was good to see you again.  1) Keep taking the medications as you are.  2) Come back next week for a blood pressure check.  3) We gave you a flu and pneumonia shot.  4) Call me if you want a second opinion for left knee surgery.  5) Call me if you change your mind about colonoscopy.  I will see you back in 1 year, sooner if necessary.

## 2017-05-08 NOTE — Assessment & Plan Note (Signed)
Assessment  Over the last 8 months he has lost 4 pounds. He continues to work on portion size and is trying to be as active as he can despite the left knee osteoarthritis.  Plan  He was praised for his weight loss and continued efforts at lifestyle modification. It is hoped that if his left knee pain is markedly improved with the total knee arthroplasty that he may be able to achieve his ultimate goal of a weight less than 165 pounds. We will reassess the success in further weight loss at the follow-up visit.

## 2017-05-08 NOTE — Assessment & Plan Note (Signed)
Assessment  He remains uninterested in coloscopic surveillance despite the history of tubular adenoma removed endoscopically 11 years ago.  Plan  I will bring the issue up once again at the follow-up visit to assess his willingness to restart colonoscopic surveillance once again.

## 2017-05-08 NOTE — Assessment & Plan Note (Signed)
Assessment  An echocardiogram 1 year ago revealed only mild aortic stenosis. He denies any chest pain, shortness of breath, or syncope.  Plan  We will continue to follow clinically. Given the mild aortic stenosis he does not require another echo for 3-5 years after his most recent one assuming no changes in symptoms.

## 2017-05-08 NOTE — Assessment & Plan Note (Signed)
Assessment  In 2007, while he was drinking, he had an episode of atrial fibrillation. He was initially treated with warfarin but subsequently converted to aspirin 325 mg by mouth daily after stopping drinking and having no further episodes of atrial fibrillation clinically. He denies any palpitations or dizziness.  Plan  It sounds like he had a provoked, single, episode of paroxysmal atrial fibrillation over 10 years ago. It had been decided not to anticoagulate chronically with warfarin. We will continue aspirin 325 mg by mouth daily and carvedilol 12.5 mg by mouth twice daily. Were he to flip back into atrial fibrillation, we will at least have some rate control. Of note, he has been abstinent from alcohol for a considerable time. We will reassess for signs or symptoms suggestive of recurrent atrial fibrillation at the follow-up visit.

## 2017-05-08 NOTE — Assessment & Plan Note (Signed)
Assessment  His blood pressure today was slightly elevated at 149/82. He states he recently got over a sinus infection where he took over-the-counter antihistamines and decongestants. His last dose was several days ago so I suspect it is been cleared from his system. His current antihypertensive regimen includes carvedilol 12.5 mg twice daily. His pulse is 65 thus the doses optimized.  Plan  A nursing appointment was scheduled for next week and we will repeat the blood pressure at that time. If elevated I will initiate a another antihypertensive medication, likely lisinopril. In the meantime, we will continue the carvedilol at 12.5 mg by mouth twice daily. We will reassess his blood pressure at the follow-up visit.

## 2017-05-13 ENCOUNTER — Encounter: Payer: Self-pay | Admitting: *Deleted

## 2017-05-13 DIAGNOSIS — M25562 Pain in left knee: Secondary | ICD-10-CM | POA: Diagnosis not present

## 2017-05-13 DIAGNOSIS — M25561 Pain in right knee: Secondary | ICD-10-CM | POA: Diagnosis not present

## 2017-05-13 NOTE — Progress Notes (Signed)
Thank you for the results.  Given the reported home blood pressure readings I have asked [name] to bring both of his blood pressure monitors with him to the next visit so that we can check the calibration.  If appropriate we will go with his home blood pressures and not add additional antihypertensive medications.  If his home monitors are reading inappropriately low he will be a candidate for escalation of his antihypertensive regimen.

## 2017-05-13 NOTE — Progress Notes (Signed)
Pt here for blood pressure check as requested by his pcp.    BP 156/73  P 74 (left arm)   BP 149/94  P 74 (right arm)   BP 153/79  P 76  (right arm)  BP taken 3x at pt's request- has a bp monitor at home and readings usually range 120-130 over 70-80.  Results forwarded to pcp.Despina Hidden Cassady11/14/201810:18 AM

## 2017-06-09 ENCOUNTER — Encounter (INDEPENDENT_AMBULATORY_CARE_PROVIDER_SITE_OTHER): Payer: Self-pay

## 2017-06-10 ENCOUNTER — Other Ambulatory Visit: Payer: Self-pay | Admitting: *Deleted

## 2017-06-10 DIAGNOSIS — E785 Hyperlipidemia, unspecified: Secondary | ICD-10-CM

## 2017-06-10 DIAGNOSIS — I25119 Atherosclerotic heart disease of native coronary artery with unspecified angina pectoris: Secondary | ICD-10-CM

## 2017-06-10 MED ORDER — ATORVASTATIN CALCIUM 40 MG PO TABS: 40.0000 mg | ORAL_TABLET | Freq: Every day | ORAL | 3 refills | Status: DC

## 2017-06-10 MED ORDER — ASPIRIN EC 325 MG PO TBEC: 325.0000 mg | DELAYED_RELEASE_TABLET | Freq: Every day | ORAL | 3 refills | Status: DC

## 2017-06-10 MED ORDER — CARVEDILOL 12.5 MG PO TABS: 12.5000 mg | ORAL_TABLET | Freq: Two times a day (BID) | ORAL | 3 refills | Status: DC

## 2017-06-16 ENCOUNTER — Other Ambulatory Visit: Payer: Self-pay | Admitting: Internal Medicine

## 2017-06-16 DIAGNOSIS — E785 Hyperlipidemia, unspecified: Secondary | ICD-10-CM

## 2017-06-16 DIAGNOSIS — I25119 Atherosclerotic heart disease of native coronary artery with unspecified angina pectoris: Secondary | ICD-10-CM

## 2017-06-17 ENCOUNTER — Other Ambulatory Visit: Payer: Self-pay | Admitting: *Deleted

## 2017-06-17 ENCOUNTER — Encounter (INDEPENDENT_AMBULATORY_CARE_PROVIDER_SITE_OTHER): Payer: Self-pay

## 2017-06-17 DIAGNOSIS — E785 Hyperlipidemia, unspecified: Secondary | ICD-10-CM

## 2017-06-17 DIAGNOSIS — I25119 Atherosclerotic heart disease of native coronary artery with unspecified angina pectoris: Secondary | ICD-10-CM

## 2017-06-17 MED ORDER — ASPIRIN EC 325 MG PO TBEC: 325.0000 mg | DELAYED_RELEASE_TABLET | Freq: Every day | ORAL | 3 refills | Status: DC

## 2017-06-17 NOTE — Telephone Encounter (Signed)
Pt needs rxs sent to [facility]. Thanks

## 2017-07-13 DIAGNOSIS — M25562 Pain in left knee: Secondary | ICD-10-CM | POA: Diagnosis not present

## 2017-07-13 NOTE — Pre-Procedure Instructions (Signed)
[name]  07/13/2017      [facility] [place], [place] [address] Phone: [phone] Fax: [phone]  [facility] [address] [place]), [place] - [address]  [place]) [place] [address] Phone: [phone] Fax: [phone]    Your procedure is scheduled on January 29  Report to [facility] at 0800 A.M.  Call this number if you have problems the morning of surgery:  [phone]   Remember:  Do not eat food or drink liquids after midnight.  Continue all medications as directed by your physician except follow these medication instructions before surgery below   Take these medicines the morning of surgery with A SIP OF WATER carvedilol (COREG)  7 days prior to surgery STOP taking any Aspirin(unless otherwise instructed by your surgeon), Aleve, Naproxen, Ibuprofen, Motrin, Advil, Goody's, BC's, all herbal medications, fish oil, and all vitamins  Follow your doctors instructions regarding your Aspirin.  If no instructions were given by your doctor, then you will need to call the prescribing office office to get instructions.       Do not wear jewelry  Do not wear lotions, powders, or cologne, or deodorant.  Men may shave face and neck.  Do not bring valuables to the hospital.  [facility] is not responsible for any belongings or valuables.  Contacts, dentures or bridgework may not be worn into surgery.  Leave your suitcase in the car.  After surgery it may be brought to your room.  For patients admitted to the hospital, discharge time will be determined by your treatment team.  Patients discharged the day of surgery will not be allowed to drive home.    Special instructions:   [facility]- Preparing For Surgery  Before surgery, you can play an important role. Because skin is not sterile, your skin needs to be as free of germs as possible.  You can reduce the number of germs on your skin by washing with CHG (chlorahexidine gluconate) Soap before surgery.  CHG is an antiseptic cleaner which kills germs and bonds with the skin to continue killing germs even after washing.  Please do not use if you have an allergy to CHG or antibacterial soaps. If your skin becomes reddened/irritated stop using the CHG.  Do not shave (including legs and underarms) for at least 48 hours prior to first CHG shower. It is OK to shave your face.  Please follow these instructions carefully.   1. Shower the NIGHT BEFORE SURGERY and the MORNING OF SURGERY with CHG.   2. If you chose to wash your hair, wash your hair first as usual with your normal shampoo.  3. After you shampoo, rinse your hair and body thoroughly to remove the shampoo.  4. Use CHG as you would any other liquid soap. You can apply CHG directly to the skin and wash gently with a scrungie or a clean washcloth.   5. Apply the CHG Soap to your body ONLY FROM THE NECK DOWN.  Do not use on open wounds or open sores. Avoid contact with your eyes, ears, mouth and genitals (private parts). Wash Face and genitals (private parts)  with your normal soap.  6. Wash thoroughly, paying special attention to the area where your surgery will be performed.  7. Thoroughly rinse your body with warm water from the neck down.  8. DO NOT shower/wash with your  normal soap after using and rinsing off the CHG Soap.  9. Pat yourself dry with a CLEAN TOWEL.  10. Wear CLEAN PAJAMAS to bed the night before surgery, wear comfortable clothes the morning of surgery  11. Place CLEAN SHEETS on your bed the night of your first shower and DO NOT SLEEP WITH PETS.    Day of Surgery: Do not apply any deodorants/lotions. Please wear clean clothes to the hospital/surgery center.      Please read over the following fact sheets that you were given.

## 2017-07-13 NOTE — H&P (Signed)
PREOPERATIVE H&P Patient ID: [name] MRN: [id number] DOB/AGE: 29-Dec-1945 72 y.o.  Chief Complaint: OA LEFT KNEE  Planned Procedure Date: 07/28/2017 Medical and Cardiac Clearance by Dr. Eppie Gibson   HPI: [name] is a 72 y.o. male with a history of coronary artery disease with stable angina on ASA 325 mg, AS w/ mild AR, essential hypertension, and hyperlipidemia who presents for evaluation of OA LEFT KNEE. The patient has a history of pain and functional disability in the left knee due to arthritis and has failed non-surgical conservative treatments for greater than 12 weeks to include NSAID's and/or analgesics, corticosteriod injections, viscosupplementation injections and activity modification.  Onset of symptoms was gradual, starting 3+ years ago with gradually worsening course since that time.  Patient currently rates pain at 8 out of 10 with activity. Patient has night pain, worsening of pain with activity and weight bearing and pain that interferes with activities of daily living.  Patient has evidence of subchondral cysts, subchondral sclerosis and joint space narrowing by imaging studies. There is no active infection.  Past Medical History:  Diagnosis Date  . Allergic rhinitis 08/08/2013   takes Loratadine daily   . Aortic stenosis 09/08/2011   With mild aortic regurgitation, mean gradient 13 mmHg   . Cataract    right but immature  . Coronary artery disease 10/02/2009   Cardiac cath (October 2007): 20% left main, 60% mid LAD, 60-70% distal LAD, 30-40% first diagonal, 30% circumflex, 40% OM, 30-40% RCA   . Degenerative joint disease of shoulder 08/03/2013   Bilateral, s/p right total shoulder arthroplasty 05/29/2011 and left shoulder arthroplasty 06/08/2014  . Diastolic dysfunction 36/11/4401   Echo (04/04/2016): Grade I  . Diverticulosis 10/07/2013   Seen on colonoscopy in 2007   . Erectile dysfunction 05/07/2009  . Essential hypertension 08/03/2013   had been on Lisinopril but  stopped per MD  . First degree AV block 03/14/2016  . Gastric ulcer 10/07/2013   Seen on EGD 04/10/2006   . Hemorrhoids 10/07/2013  . Hyperlipidemia 10/02/2009  . Paroxysmal atrial fibrillation ([facility]) 07/14/2006  . Sciatica associated with disorder of lumbar spine 08/03/2013   Anterolisthesis with right L 4-5 nerve root compression.  Treated with epidural injections and Physical Therapy.   . Seborrheic keratosis 10/07/2013  . Tubular adenoma of colon    Past Surgical History:  Procedure Laterality Date  . CARDIAC CATHETERIZATION  2007  . CARDIOVERSION    . COLONOSCOPY    . KNEE ARTHROSCOPY WITH MENISCAL REPAIR Right 01/30/2011  . right finger surgery     as a child  . TONSILLECTOMY    . TOTAL SHOULDER ARTHROPLASTY  05/29/2011   Procedure: TOTAL SHOULDER ARTHROPLASTY;  Surgeon: Metta Clines Supple;  Location: [facility];  Service: Orthopedics;  Laterality: Right;  . TOTAL SHOULDER ARTHROPLASTY Left 06/08/2014   DR SUPPLE  . TOTAL SHOULDER ARTHROPLASTY Left 06/08/2014   Procedure: LEFT TOTAL SHOULDER ARTHROPLASTY;  Surgeon: Marin Shutter, MD;  Location: [facility];  Service: Orthopedics;  Laterality: Left;   No Known Allergies   Prior to Admission medications   Medication Sig Start Date End Date Taking? Authorizing Provider  aspirin EC 325 MG tablet Take 1 tablet (325 mg total) by mouth daily. 06/17/17  Yes Oval Linsey, MD  atorvastatin (LIPITOR) 40 MG tablet Take 1 tablet (40 mg total) by mouth daily. 06/17/17  Yes Oval Linsey, MD  carvedilol (COREG) 12.5 MG tablet Take 1 tablet (12.5 mg total) by mouth 2 (two) times daily  with a meal. 06/17/17  Yes Oval Linsey, MD  naproxen sodium (ALEVE) 220 MG tablet Take 440 mg by mouth 2 (two) times daily as needed.   Yes [provider]   Social History  Married former smoker.  1.5 ppd x 30 years.  Quit in 2010.  No EtOH.  Family History  Problem Relation Age of Onset  . Coronary artery disease Father 52       Died of myocardial  infarction  . Bradycardia Mother 22       Requiring pacemeaker placement  . Coronary artery disease Brother 43       Died of myocardial infarction  . Obesity Daughter   . Healthy Son     ROS: Currently denies lightheadedness, dizziness, Fever, chills, CP, SOB.   No personal history of DVT, PE, MI, or CVA. He has dentures All other systems have been reviewed and were otherwise currently negative with the exception of those mentioned in the HPI and as above.  Objective: Vitals: Ht: 5 feet 7 inches wt: 190 temp: 97.2 BP: 161/65 pulse: 77 O2  97% on room air. Physical Exam: General: Alert, NAD.  Antalgic Gait  HEENT: EOMI, Good Neck Extension  Pulm: No increased work of breathing.  Clear B/L A/P w/o crackle or wheeze.  CV: RRR, 2/6 Systolic>Diastolic murmur LUSB. GI: soft, NT, ND Neuro: Neuro without gross focal deficit.  Sensation intact distally Skin: No lesions in the area of chief complaint MSK/Surgical Site: Left knee w/o redness or effusion.  + JLT. ROM 0-115.  5/5 strength in extension and flexion.  +EHL/FHL.  NVI.  Stable varus and valgus stress.   Imaging Review Plain radiographs demonstrate severe degenerative joint disease of both knees.  Assessment: OA LEFT KNEE Principal Problem:   Primary osteoarthritis of left knee Active Problems:   Hyperlipidemia   Paroxysmal atrial fibrillation (HCC)   Aortic stenosis   Essential hypertension   Plan: Plan for Procedure(s): LEFT TOTAL KNEE ARTHROPLASTY  The patient history, physical exam, clinical judgement of the provider and imaging are consistent with end stage degenerative joint disease and total joint arthroplasty is deemed medically necessary. The treatment options including medical management, injection therapy, and arthroplasty were discussed at length. The risks and benefits of Procedure(s): LEFT TOTAL KNEE ARTHROPLASTY were presented and reviewed.  The risks of nonoperative treatment, versus surgical intervention  including but not limited to continued pain, aseptic loosening, stiffness, dislocation/subluxation, infection, bleeding, nerve injury, blood clots, cardiopulmonary complications, morbidity, mortality, among others were discussed. The patient verbalizes understanding and wishes to proceed with the plan.  Patient is being admitted for inpatient treatment for surgery, pain control, PT, OT, prophylactic antibiotics, VTE prophylaxis, progressive ambulation, ADL's and discharge planning.   Dental prophylaxis discussed and recommended for 2 years postoperatively.   The patient does meet the criteria for TXA which will be used perioperatively via IV.    ASA 325 mg will be continued postoperatively for DVT prophylaxis in addition to SCDs, and early ambulation.  The patient is planning to be discharged home with home health services [facility] home care) in care of his wife.  His stepdaughter will also help for several days-she is a Patent examiner.    Severity of Illness: The appropriate patient status for this patient is OBSERVATION. Observation status is judged to be reasonable and necessary in order to provide the required intensity of service to ensure the patient's safety. The patient's presenting symptoms, physical exam findings, and initial radiographic and laboratory data in  the context of their medical condition is felt to place them at decreased risk for further clinical deterioration. Furthermore, it is anticipated that the patient will be medically stable for discharge from the hospital within 2 midnights of admission. The following factors support the patient status of observation.    Prudencio Burly III, PA-C 07/13/2017 5:07 PM

## 2017-07-14 ENCOUNTER — Encounter (HOSPITAL_COMMUNITY): Payer: Self-pay

## 2017-07-14 ENCOUNTER — Other Ambulatory Visit: Payer: Self-pay

## 2017-07-14 ENCOUNTER — Encounter (HOSPITAL_COMMUNITY)
Admission: RE | Admit: 2017-07-14 | Discharge: 2017-07-14 | Disposition: A | Discharge status: Home / Self Care | Payer: Medicare HMO | Source: Ambulatory Visit | Attending: Orthopedic Surgery | Admitting: Orthopedic Surgery

## 2017-07-14 DIAGNOSIS — Z01812 Encounter for preprocedural laboratory examination: Secondary | ICD-10-CM | POA: Diagnosis not present

## 2017-07-14 LAB — BASIC METABOLIC PANEL
Anion gap: 11 (ref 5–15)
BUN: 20 mg/dL (ref 6–20)
CO2: 21 mmol/L — ABNORMAL LOW (ref 22–32)
Calcium: 9.2 mg/dL (ref 8.9–10.3)
Chloride: 107 mmol/L (ref 101–111)
Creatinine, Ser: 1.1 mg/dL (ref 0.61–1.24)
GFR calc Af Amer: >60 mL/min (ref >60)
GFR calc non Af Amer: >60 mL/min (ref >60)
Glucose, Bld: 165 mg/dL — ABNORMAL HIGH (ref 65–99)
Potassium: 3.9 mmol/L (ref 3.5–5.1)
Sodium: 139 mmol/L (ref 135–145)

## 2017-07-14 LAB — CBC
HCT: 45.4 % (ref 39.0–52.0)
Hemoglobin: 15.8 g/dL (ref 13.0–17.0)
MCH: 32.2 pg (ref 26.0–34.0)
MCHC: 34.8 g/dL (ref 30.0–36.0)
MCV: 92.5 fL (ref 78.0–100.0)
Platelets: 168 10*3/uL (ref 150–400)
RBC: 4.91 MIL/uL (ref 4.22–5.81)
RDW: 12.8 % (ref 11.5–15.5)
WBC: 9.7 10*3/uL (ref 4.0–10.5)

## 2017-07-14 LAB — SURGICAL PCR SCREEN
MRSA, PCR: NEGATIVE
Staphylococcus aureus: POSITIVE — AB

## 2017-07-14 NOTE — Progress Notes (Signed)
PCP - Oval Linsey Cardiologist - [place]  Chest x-ray - not needed EKG - 05/08/17 Stress Test - denies ECHO - 2017 Cardiac Cath 2011-   Sleep Study - denies    Aspirin Instructions: due to stop aspirin 1/23  Anesthesia review: YEs CAD, hasnt seen cardiology in 2-3 years  Patient denies shortness of breath, fever, cough and chest pain at PAT appointment   Patient verbalized understanding of instructions that were given to them at the PAT appointment. Patient was also instructed that they will need to review over the PAT instructions again at home before surgery.

## 2017-07-14 NOTE — Progress Notes (Signed)
perscription called in at [place] [phone], patient contacted and verbalized understanding

## 2017-07-15 NOTE — Progress Notes (Signed)
Anesthesia Chart Review:  Pt is a 72 year old male scheduled for L total knee arthroplasty on 07/28/2017 with Edmonia Lynch, MD  Providers:  - PCP is Oval Linsey, MD who now follows pt for CAD, aortic stenosis, and afib. Last office visit 05/08/17 addresses each of these problems.   - Used to see cardiologist Kirk Ruths, MD. Last office visit 08/23/13.  Now PCP manages cardiac problems.    PMH includes: CAD, aortic stenosis, PAF (in the setting of cardiomyopathy/ETOH in 2007), HTN, hyperlipidemia. Former smoker. History alcohol abuse (reportedly none since 2007). BMI 30. S/p L shoulder arthroplasty 06/08/14. S/p R shoulder arthroplasty 05/29/11  Medications include: ASA 325 mg, Lipitor, carvedilol  BP (!) 158/68   Pulse 77   Temp (!) 36.4 C   Resp 20   Ht 5' 6.5" (1.689 m)   Wt 189 lb 4.8 oz (85.9 kg)   SpO2 97%   BMI 30.10 kg/m   Preoperative labs reviewed.    EKG 05/08/17: Sinus rhythm with 1st degree A-V block with PACs in a pattern of bigeminy  Echo 04/04/16:  - Left ventricle: The cavity size was normal. Wall thickness was normal. Systolic function was normal. The estimated ejection fraction was in the range of 60% to 65%. Doppler parameters are consistent with abnormal left ventricular relaxation (grade 1 diastolic dysfunction). - Aortic valve: AV is difficult to see well It is thickened, calcified with some restricted motion Peak gradient through the valve is 10 mm Hg There is very mild AI Compared to report of 2015, peak gradient is now lower. - Left atrium: The atrium was mildly dilated.  Cardiac cath 04/09/06:  - LM: 20% stenosis - LAD: 30% proximal stenosis, 60% mid stenosis, 60-70% distal stenosis.  D1 30-40% stenosis. -LCx: Multiple 30% discrete lesions in proximal and mid vessel.  OM1 40% stenosis. -RCA: 30-40% mid stenosis.  Diffuse distal PDA and posterior lateral disease in <1.5 mm vessels.  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  He is not a candidate for Coumadin right now.  Reviewed case with Dr. Valma Cava. If no changes, I anticipate pt can proceed with surgery as scheduled.   Willeen Cass, FNP-BC [facility]/Anesthesiology Phone: [phone] 07/15/2017 4:54 PM

## 2017-07-27 MED ORDER — LACTATED RINGERS IV SOLN: INTRAVENOUS | Status: DC

## 2017-07-27 MED ORDER — CEFAZOLIN SODIUM-DEXTROSE 2-4 GM/100ML-% IV SOLN
2.0000 g | INTRAVENOUS | Status: AC
  Administered 2017-07-28: 2 g via INTRAVENOUS
  Filled 2017-07-27: qty 100

## 2017-07-27 MED ORDER — BUPIVACAINE LIPOSOME 1.3 % IJ SUSP
20.0000 mL | INTRAMUSCULAR | Status: AC
  Administered 2017-07-28: 20 mL
  Filled 2017-07-27: qty 20

## 2017-07-27 MED ORDER — TRANEXAMIC ACID 1000 MG/10ML IV SOLN
1000.0000 mg | INTRAVENOUS | Status: AC
  Administered 2017-07-28: 1000 mg via INTRAVENOUS
  Filled 2017-07-27: qty 1100

## 2017-07-27 MED ORDER — GABAPENTIN 300 MG PO CAPS
300.0000 mg | ORAL_CAPSULE | Freq: Once | ORAL | Status: AC
  Administered 2017-07-28: 300 mg via ORAL
  Filled 2017-07-27: qty 1

## 2017-07-27 MED ORDER — ACETAMINOPHEN 500 MG PO TABS
1000.0000 mg | ORAL_TABLET | Freq: Once | ORAL | Status: AC
  Administered 2017-07-28: 1000 mg via ORAL
  Filled 2017-07-27: qty 2

## 2017-07-28 ENCOUNTER — Inpatient Hospital Stay (HOSPITAL_COMMUNITY): Payer: Medicare HMO | Admitting: Emergency Medicine

## 2017-07-28 ENCOUNTER — Encounter (HOSPITAL_COMMUNITY): Payer: Self-pay

## 2017-07-28 ENCOUNTER — Other Ambulatory Visit: Payer: Self-pay

## 2017-07-28 ENCOUNTER — Observation Stay (HOSPITAL_COMMUNITY)
Admission: RE | Admit: 2017-07-28 | Discharge: 2017-07-29 | Disposition: A | Discharge status: Home / Self Care | Payer: Medicare HMO | Source: Ambulatory Visit | Attending: Orthopedic Surgery | Admitting: Orthopedic Surgery

## 2017-07-28 ENCOUNTER — Encounter (HOSPITAL_COMMUNITY)
Admission: RE | Disposition: A | Discharge status: Home / Self Care | Payer: Self-pay | Source: Ambulatory Visit | Attending: Orthopedic Surgery

## 2017-07-28 ENCOUNTER — Inpatient Hospital Stay (HOSPITAL_COMMUNITY): Payer: Medicare HMO | Admitting: Certified Registered"

## 2017-07-28 ENCOUNTER — Observation Stay (HOSPITAL_COMMUNITY): Payer: Medicare HMO

## 2017-07-28 DIAGNOSIS — Z96612 Presence of left artificial shoulder joint: Secondary | ICD-10-CM | POA: Insufficient documentation

## 2017-07-28 DIAGNOSIS — I2510 Atherosclerotic heart disease of native coronary artery without angina pectoris: Secondary | ICD-10-CM | POA: Insufficient documentation

## 2017-07-28 DIAGNOSIS — Z96611 Presence of right artificial shoulder joint: Secondary | ICD-10-CM | POA: Diagnosis not present

## 2017-07-28 DIAGNOSIS — I25118 Atherosclerotic heart disease of native coronary artery with other forms of angina pectoris: Secondary | ICD-10-CM

## 2017-07-28 DIAGNOSIS — I350 Nonrheumatic aortic (valve) stenosis: Secondary | ICD-10-CM

## 2017-07-28 DIAGNOSIS — G8918 Other acute postprocedural pain: Secondary | ICD-10-CM | POA: Diagnosis not present

## 2017-07-28 DIAGNOSIS — E785 Hyperlipidemia, unspecified: Secondary | ICD-10-CM | POA: Diagnosis not present

## 2017-07-28 DIAGNOSIS — I25119 Atherosclerotic heart disease of native coronary artery with unspecified angina pectoris: Secondary | ICD-10-CM

## 2017-07-28 DIAGNOSIS — I10 Essential (primary) hypertension: Secondary | ICD-10-CM | POA: Diagnosis not present

## 2017-07-28 DIAGNOSIS — Z471 Aftercare following joint replacement surgery: Secondary | ICD-10-CM | POA: Diagnosis not present

## 2017-07-28 DIAGNOSIS — M25762 Osteophyte, left knee: Secondary | ICD-10-CM | POA: Insufficient documentation

## 2017-07-28 DIAGNOSIS — Z8249 Family history of ischemic heart disease and other diseases of the circulatory system: Secondary | ICD-10-CM | POA: Diagnosis not present

## 2017-07-28 DIAGNOSIS — J309 Allergic rhinitis, unspecified: Secondary | ICD-10-CM | POA: Insufficient documentation

## 2017-07-28 DIAGNOSIS — I480 Paroxysmal atrial fibrillation: Secondary | ICD-10-CM | POA: Diagnosis present

## 2017-07-28 DIAGNOSIS — I351 Nonrheumatic aortic (valve) insufficiency: Secondary | ICD-10-CM | POA: Insufficient documentation

## 2017-07-28 DIAGNOSIS — Z87891 Personal history of nicotine dependence: Secondary | ICD-10-CM | POA: Insufficient documentation

## 2017-07-28 DIAGNOSIS — Z79899 Other long term (current) drug therapy: Secondary | ICD-10-CM | POA: Insufficient documentation

## 2017-07-28 DIAGNOSIS — Z96652 Presence of left artificial knee joint: Secondary | ICD-10-CM | POA: Diagnosis not present

## 2017-07-28 DIAGNOSIS — M1712 Unilateral primary osteoarthritis, left knee: Secondary | ICD-10-CM | POA: Diagnosis not present

## 2017-07-28 DIAGNOSIS — E7800 Pure hypercholesterolemia, unspecified: Secondary | ICD-10-CM

## 2017-07-28 DIAGNOSIS — L821 Other seborrheic keratosis: Secondary | ICD-10-CM

## 2017-07-28 DIAGNOSIS — Z96659 Presence of unspecified artificial knee joint: Secondary | ICD-10-CM

## 2017-07-28 HISTORY — PX: TOTAL KNEE ARTHROPLASTY: SHX125

## 2017-07-28 SURGERY — ARTHROPLASTY, KNEE, TOTAL
Anesthesia: General | Site: Knee | Laterality: Left

## 2017-07-28 MED ORDER — ATORVASTATIN CALCIUM 40 MG PO TABS
40.0000 mg | ORAL_TABLET | Freq: Every day | ORAL | Status: DC
  Administered 2017-07-28 – 2017-07-29 (×2): 40 mg via ORAL
  Filled 2017-07-28 (×2): qty 1

## 2017-07-28 MED ORDER — SODIUM CHLORIDE FLUSH 0.9 % IV SOLN
INTRAVENOUS | Status: DC | PRN
  Administered 2017-07-28: 30 mL

## 2017-07-28 MED ORDER — ACETAMINOPHEN 500 MG PO TABS
1000.0000 mg | ORAL_TABLET | Freq: Three times a day (TID) | ORAL | Status: DC
  Administered 2017-07-28 – 2017-07-29 (×3): 1000 mg via ORAL
  Filled 2017-07-28 (×3): qty 2

## 2017-07-28 MED ORDER — DIPHENHYDRAMINE HCL 12.5 MG/5ML PO ELIX: 12.5000 mg | ORAL_SOLUTION | ORAL | Status: DC | PRN

## 2017-07-28 MED ORDER — LIDOCAINE 2% (20 MG/ML) 5 ML SYRINGE
INTRAMUSCULAR | Status: AC
  Filled 2017-07-28: qty 5

## 2017-07-28 MED ORDER — PHENYLEPHRINE HCL 10 MG/ML IJ SOLN
INTRAVENOUS | Status: DC | PRN
  Administered 2017-07-28: 50 ug/min via INTRAVENOUS

## 2017-07-28 MED ORDER — PHENYLEPHRINE HCL 10 MG/ML IJ SOLN
INTRAMUSCULAR | Status: AC
  Filled 2017-07-28: qty 1

## 2017-07-28 MED ORDER — HYDROMORPHONE HCL 1 MG/ML IJ SOLN: 0.2500 mg | INTRAMUSCULAR | Status: DC | PRN

## 2017-07-28 MED ORDER — DOCUSATE SODIUM 100 MG PO CAPS
100.0000 mg | ORAL_CAPSULE | Freq: Two times a day (BID) | ORAL | Status: DC
  Administered 2017-07-28 – 2017-07-29 (×2): 100 mg via ORAL
  Filled 2017-07-28 (×2): qty 1

## 2017-07-28 MED ORDER — ROPIVACAINE HCL 7.5 MG/ML IJ SOLN
INTRAMUSCULAR | Status: DC | PRN
  Administered 2017-07-28: 20 mL via PERINEURAL

## 2017-07-28 MED ORDER — PHENOL 1.4 % MT LIQD: 1.0000 | OROMUCOSAL | Status: DC | PRN

## 2017-07-28 MED ORDER — ONDANSETRON HCL 4 MG/2ML IJ SOLN: 4.0000 mg | Freq: Four times a day (QID) | INTRAMUSCULAR | Status: DC | PRN

## 2017-07-28 MED ORDER — SODIUM CHLORIDE 0.9 % IR SOLN
Status: DC | PRN
  Administered 2017-07-28: 3000 mL

## 2017-07-28 MED ORDER — ONDANSETRON HCL 4 MG/2ML IJ SOLN
INTRAMUSCULAR | Status: DC | PRN
  Administered 2017-07-28: 4 mg via INTRAVENOUS

## 2017-07-28 MED ORDER — FENTANYL CITRATE (PF) 100 MCG/2ML IJ SOLN
INTRAMUSCULAR | Status: DC | PRN
  Administered 2017-07-28 (×4): 25 ug via INTRAVENOUS

## 2017-07-28 MED ORDER — LACTATED RINGERS IV SOLN
INTRAVENOUS | Status: DC | PRN
  Administered 2017-07-28: 09:00:00 via INTRAVENOUS

## 2017-07-28 MED ORDER — OXYCODONE HCL 5 MG PO TABS: 5.0000 mg | ORAL_TABLET | ORAL | Status: DC | PRN

## 2017-07-28 MED ORDER — CEFAZOLIN SODIUM-DEXTROSE 1-4 GM/50ML-% IV SOLN
1.0000 g | Freq: Four times a day (QID) | INTRAVENOUS | Status: AC
  Administered 2017-07-28 – 2017-07-29 (×2): 1 g via INTRAVENOUS
  Filled 2017-07-28 (×2): qty 50

## 2017-07-28 MED ORDER — PHENYLEPHRINE HCL 10 MG/ML IJ SOLN
INTRAMUSCULAR | Status: DC | PRN
  Administered 2017-07-28: 80 ug via INTRAVENOUS

## 2017-07-28 MED ORDER — BACLOFEN 10 MG PO TABS: 10.0000 mg | ORAL_TABLET | Freq: Three times a day (TID) | ORAL | 0 refills | Status: DC | PRN

## 2017-07-28 MED ORDER — FENTANYL CITRATE (PF) 100 MCG/2ML IJ SOLN
100.0000 ug | Freq: Once | INTRAMUSCULAR | Status: AC
  Administered 2017-07-28: 50 ug via INTRAVENOUS

## 2017-07-28 MED ORDER — METHOCARBAMOL 1000 MG/10ML IJ SOLN
500.0000 mg | Freq: Four times a day (QID) | INTRAVENOUS | Status: DC | PRN
  Filled 2017-07-28: qty 5

## 2017-07-28 MED ORDER — MIDAZOLAM HCL 2 MG/2ML IJ SOLN
INTRAMUSCULAR | Status: AC
  Administered 2017-07-28: 1 mg via INTRAVENOUS
  Filled 2017-07-28: qty 2

## 2017-07-28 MED ORDER — LIDOCAINE HCL (CARDIAC) 20 MG/ML IV SOLN
INTRAVENOUS | Status: DC | PRN
  Administered 2017-07-28: 30 mg via INTRAVENOUS

## 2017-07-28 MED ORDER — OMEPRAZOLE 20 MG PO CPDR: 20.0000 mg | DELAYED_RELEASE_CAPSULE | Freq: Every day | ORAL | 0 refills | Status: DC

## 2017-07-28 MED ORDER — SENNA 8.6 MG PO TABS
1.0000 | ORAL_TABLET | Freq: Two times a day (BID) | ORAL | Status: DC
  Administered 2017-07-28 – 2017-07-29 (×2): 8.6 mg via ORAL
  Filled 2017-07-28 (×2): qty 1

## 2017-07-28 MED ORDER — MENTHOL 3 MG MT LOZG: 1.0000 | LOZENGE | OROMUCOSAL | Status: DC | PRN

## 2017-07-28 MED ORDER — CHLORHEXIDINE GLUCONATE 4 % EX LIQD: 60.0000 mL | Freq: Once | CUTANEOUS | Status: DC

## 2017-07-28 MED ORDER — CELECOXIB 200 MG PO CAPS
200.0000 mg | ORAL_CAPSULE | Freq: Two times a day (BID) | ORAL | Status: DC
  Administered 2017-07-28 – 2017-07-29 (×2): 200 mg via ORAL
  Filled 2017-07-28 (×2): qty 1

## 2017-07-28 MED ORDER — FENTANYL CITRATE (PF) 250 MCG/5ML IJ SOLN
INTRAMUSCULAR | Status: AC
  Filled 2017-07-28: qty 5

## 2017-07-28 MED ORDER — HYDROMORPHONE HCL 1 MG/ML IJ SOLN: 0.5000 mg | INTRAMUSCULAR | Status: DC | PRN

## 2017-07-28 MED ORDER — 0.9 % SODIUM CHLORIDE (POUR BTL) OPTIME
TOPICAL | Status: DC | PRN
  Administered 2017-07-28: 1000 mL

## 2017-07-28 MED ORDER — CARVEDILOL 12.5 MG PO TABS
12.5000 mg | ORAL_TABLET | Freq: Two times a day (BID) | ORAL | Status: DC
  Administered 2017-07-28 – 2017-07-29 (×2): 12.5 mg via ORAL
  Filled 2017-07-28 (×2): qty 1

## 2017-07-28 MED ORDER — CELECOXIB 200 MG PO CAPS: 200.0000 mg | ORAL_CAPSULE | Freq: Two times a day (BID) | ORAL | 0 refills | Status: DC

## 2017-07-28 MED ORDER — PROPOFOL 10 MG/ML IV BOLUS
INTRAVENOUS | Status: AC
  Filled 2017-07-28: qty 20

## 2017-07-28 MED ORDER — FENTANYL CITRATE (PF) 100 MCG/2ML IJ SOLN
INTRAMUSCULAR | Status: AC
  Administered 2017-07-28: 50 ug via INTRAVENOUS
  Filled 2017-07-28: qty 2

## 2017-07-28 MED ORDER — OXYCODONE HCL 5 MG PO TABS
ORAL_TABLET | ORAL | Status: AC
  Administered 2017-07-28: 10 mg via ORAL
  Filled 2017-07-28: qty 2

## 2017-07-28 MED ORDER — OXYCODONE HCL 5 MG PO TABS
10.0000 mg | ORAL_TABLET | ORAL | Status: DC | PRN
  Administered 2017-07-28 – 2017-07-29 (×5): 10 mg via ORAL
  Filled 2017-07-28 (×3): qty 2

## 2017-07-28 MED ORDER — METOCLOPRAMIDE HCL 5 MG/ML IJ SOLN: 5.0000 mg | Freq: Three times a day (TID) | INTRAMUSCULAR | Status: DC | PRN

## 2017-07-28 MED ORDER — ASPIRIN EC 325 MG PO TBEC
325.0000 mg | DELAYED_RELEASE_TABLET | Freq: Every day | ORAL | Status: DC
  Administered 2017-07-28: 325 mg via ORAL
  Filled 2017-07-28: qty 1

## 2017-07-28 MED ORDER — POLYETHYLENE GLYCOL 3350 17 G PO PACK: 17.0000 g | PACK | Freq: Every day | ORAL | Status: DC | PRN

## 2017-07-28 MED ORDER — METHOCARBAMOL 500 MG PO TABS
500.0000 mg | ORAL_TABLET | Freq: Four times a day (QID) | ORAL | Status: DC | PRN
  Administered 2017-07-28 – 2017-07-29 (×2): 500 mg via ORAL
  Filled 2017-07-28: qty 1

## 2017-07-28 MED ORDER — METOCLOPRAMIDE HCL 5 MG PO TABS: 5.0000 mg | ORAL_TABLET | Freq: Three times a day (TID) | ORAL | Status: DC | PRN

## 2017-07-28 MED ORDER — ONDANSETRON HCL 4 MG PO TABS: 4.0000 mg | ORAL_TABLET | Freq: Three times a day (TID) | ORAL | 0 refills | Status: DC | PRN

## 2017-07-28 MED ORDER — SORBITOL 70 % SOLN: 30.0000 mL | Freq: Every day | Status: DC | PRN

## 2017-07-28 MED ORDER — DEXAMETHASONE SODIUM PHOSPHATE 10 MG/ML IJ SOLN
10.0000 mg | Freq: Once | INTRAMUSCULAR | Status: AC
  Administered 2017-07-29: 10 mg via INTRAVENOUS
  Filled 2017-07-28: qty 1

## 2017-07-28 MED ORDER — METHOCARBAMOL 500 MG PO TABS
ORAL_TABLET | ORAL | Status: AC
  Administered 2017-07-28: 500 mg via ORAL
  Filled 2017-07-28: qty 1

## 2017-07-28 MED ORDER — DOCUSATE SODIUM 100 MG PO CAPS: 100.0000 mg | ORAL_CAPSULE | Freq: Two times a day (BID) | ORAL | 0 refills | Status: DC

## 2017-07-28 MED ORDER — ASPIRIN EC 325 MG PO TBEC: 325.0000 mg | DELAYED_RELEASE_TABLET | Freq: Every day | ORAL | 0 refills | Status: DC

## 2017-07-28 MED ORDER — LACTATED RINGERS IV SOLN
INTRAVENOUS | Status: DC
  Administered 2017-07-28: 18:00:00 via INTRAVENOUS

## 2017-07-28 MED ORDER — ACETAMINOPHEN 325 MG PO TABS: 650.0000 mg | ORAL_TABLET | ORAL | Status: DC | PRN

## 2017-07-28 MED ORDER — PROPOFOL 10 MG/ML IV BOLUS
INTRAVENOUS | Status: DC | PRN
  Administered 2017-07-28: 120 mg via INTRAVENOUS

## 2017-07-28 MED ORDER — OXYCODONE HCL 5 MG PO TABS
5.0000 mg | ORAL_TABLET | ORAL | 0 refills | Status: AC | PRN
Start: 2017-07-28 — End: 2017-08-27

## 2017-07-28 MED ORDER — OXYCODONE HCL 5 MG PO TABS
ORAL_TABLET | ORAL | Status: AC
  Filled 2017-07-28: qty 2

## 2017-07-28 MED ORDER — ACETAMINOPHEN 650 MG RE SUPP: 650.0000 mg | RECTAL | Status: DC | PRN

## 2017-07-28 MED ORDER — ACETAMINOPHEN 500 MG PO TABS: 1000.0000 mg | ORAL_TABLET | Freq: Three times a day (TID) | ORAL | 0 refills | Status: AC

## 2017-07-28 MED ORDER — MIDAZOLAM HCL 2 MG/2ML IJ SOLN
2.0000 mg | Freq: Once | INTRAMUSCULAR | Status: AC
  Administered 2017-07-28: 1 mg via INTRAVENOUS

## 2017-07-28 MED ORDER — ONDANSETRON HCL 4 MG PO TABS: 4.0000 mg | ORAL_TABLET | Freq: Four times a day (QID) | ORAL | Status: DC | PRN

## 2017-07-28 SURGICAL SUPPLY — 52 items
BANDAGE ESMARK 6X9 LF (GAUZE/BANDAGES/DRESSINGS) ×1 IMPLANT
BLADE SAG 18X100X1.27 (BLADE) ×4 IMPLANT
BNDG CMPR 9X6 STRL LF SNTH (GAUZE/BANDAGES/DRESSINGS) ×1
BNDG CMPR MED 10X6 ELC LF (GAUZE/BANDAGES/DRESSINGS) ×1
BNDG CMPR MED 15X6 ELC VLCR LF (GAUZE/BANDAGES/DRESSINGS) ×1
BNDG ELASTIC 6X10 VLCR STRL LF (GAUZE/BANDAGES/DRESSINGS) ×2 IMPLANT
BNDG ELASTIC 6X15 VLCR STRL LF (GAUZE/BANDAGES/DRESSINGS) ×2 IMPLANT
BNDG ESMARK 6X9 LF (GAUZE/BANDAGES/DRESSINGS) ×2
BOWL SMART MIX CTS (DISPOSABLE) ×2 IMPLANT
CAPT KNEE TRIATH TK-4 ×1 IMPLANT
CLSR STERI-STRIP ANTIMIC 1/2X4 (GAUZE/BANDAGES/DRESSINGS) ×2 IMPLANT
COVER SURGICAL LIGHT HANDLE (MISCELLANEOUS) ×2 IMPLANT
CUFF TOURNIQUET SINGLE 34IN LL (TOURNIQUET CUFF) ×2 IMPLANT
DRAPE EXTREMITY T 121X128X90 (DRAPE) ×2 IMPLANT
DRAPE HALF SHEET 40X57 (DRAPES) ×2 IMPLANT
DRAPE U-SHAPE 47X51 STRL (DRAPES) ×2 IMPLANT
DRSG MEPILEX BORDER 4X12 (GAUZE/BANDAGES/DRESSINGS) ×2 IMPLANT
DRSG MEPILEX BORDER 4X8 (GAUZE/BANDAGES/DRESSINGS) ×2 IMPLANT
DURAPREP 26ML APPLICATOR (WOUND CARE) ×4 IMPLANT
ELECT CAUTERY BLADE 6.4 (BLADE) ×2 IMPLANT
ELECT REM PT RETURN 9FT ADLT (ELECTROSURGICAL) ×2
ELECTRODE REM PT RTRN 9FT ADLT (ELECTROSURGICAL) ×1 IMPLANT
FACESHIELD WRAPAROUND (MASK) ×4 IMPLANT
FACESHIELD WRAPAROUND OR TEAM (MASK) ×2 IMPLANT
GLOVE BIO SURGEON STRL SZ7.5 (GLOVE) ×4 IMPLANT
GLOVE BIOGEL PI IND STRL 8 (GLOVE) ×2 IMPLANT
GLOVE BIOGEL PI INDICATOR 8 (GLOVE) ×2
GOWN STRL REUS W/ TWL LRG LVL3 (GOWN DISPOSABLE) ×2 IMPLANT
GOWN STRL REUS W/ TWL XL LVL3 (GOWN DISPOSABLE) ×1 IMPLANT
GOWN STRL REUS W/TWL LRG LVL3 (GOWN DISPOSABLE) ×4
GOWN STRL REUS W/TWL XL LVL3 (GOWN DISPOSABLE) ×2
HANDPIECE INTERPULSE COAX TIP (DISPOSABLE)
IMMOBILIZER KNEE 22 UNIV (SOFTGOODS) ×2 IMPLANT
KIT BASIN OR (CUSTOM PROCEDURE TRAY) ×2 IMPLANT
KIT ROOM TURNOVER OR (KITS) ×2 IMPLANT
MANIFOLD NEPTUNE II (INSTRUMENTS) ×2 IMPLANT
NEEDLE 22X1 1/2 (OR ONLY) (NEEDLE) ×2 IMPLANT
NS IRRIG 1000ML POUR BTL (IV SOLUTION) ×2 IMPLANT
PACK TOTAL JOINT (CUSTOM PROCEDURE TRAY) ×2 IMPLANT
PAD ARMBOARD 7.5X6 YLW CONV (MISCELLANEOUS) ×2 IMPLANT
SET HNDPC FAN SPRY TIP SCT (DISPOSABLE) IMPLANT
SUCTION FRAZIER HANDLE 10FR (MISCELLANEOUS) ×1
SUCTION TUBE FRAZIER 10FR DISP (MISCELLANEOUS) ×1 IMPLANT
SUT MNCRL AB 4-0 PS2 18 (SUTURE) ×2 IMPLANT
SUT MON AB 2-0 CT1 27 (SUTURE) ×2 IMPLANT
SUT MON AB 2-0 CT1 36 (SUTURE) ×1 IMPLANT
SUT VIC AB 0 CT1 27 (SUTURE) ×2
SUT VIC AB 0 CT1 27XBRD ANBCTR (SUTURE) ×1 IMPLANT
SUT VIC AB 1 CT1 27 (SUTURE) ×4
SUT VIC AB 1 CT1 27XBRD ANBCTR (SUTURE) ×2 IMPLANT
SYR 50ML LL SCALE MARK (SYRINGE) ×2 IMPLANT
TOWEL OR 17X26 10 PK STRL BLUE (TOWEL DISPOSABLE) ×2 IMPLANT

## 2017-07-28 NOTE — Anesthesia Procedure Notes (Signed)
Anesthesia Regional Block: Adductor canal block   Pre-Anesthetic Checklist: ,, timeout performed, Correct Patient, Correct Site, Correct Laterality, Correct Procedure, Correct Position, site marked, Risks and benefits discussed, pre-op evaluation,  At surgeon's request and post-op pain management  Laterality: Left  Prep: Maximum Sterile Barrier Precautions used, chloraprep       Needles:  Injection technique: Single-shot  Needle Type: Echogenic Stimulator Needle     Needle Length: 9cm  Needle Gauge: 21     Additional Needles:   Procedures:,,,, ultrasound used (permanent image in chart),,,,  Narrative:  Start time: 07/28/2017 8:41 AM End time: 07/28/2017 8:51 AM Injection made incrementally with aspirations every 5 mL.  Performed by: Personally  Anesthesiologist: Roderic Palau, MD  Additional Notes: 2% Lidocaine skin wheel.

## 2017-07-28 NOTE — Anesthesia Postprocedure Evaluation (Signed)
Anesthesia Post Note  Patient: [name]  Procedure(s) Performed: LEFT TOTAL KNEE ARTHROPLASTY (Left Knee)     Patient location during evaluation: PACU Anesthesia Type: General and Regional Level of consciousness: awake and alert Pain management: pain level controlled Vital Signs Assessment: post-procedure vital signs reviewed and stable Respiratory status: spontaneous breathing, nonlabored ventilation, respiratory function stable and patient connected to nasal cannula oxygen Cardiovascular status: blood pressure returned to baseline and stable Postop Assessment: no apparent nausea or vomiting Anesthetic complications: no    Last Vitals:  Vitals:   07/28/17 1215 07/28/17 1230  BP: (!) 144/95   Pulse: 85 85  Resp: 18 17  Temp:    SpO2: 97% 96%    Last Pain:  Vitals:   07/28/17 1200  TempSrc:   PainSc: 0-No pain                 Remberto Lienhard,W. EDMOND

## 2017-07-28 NOTE — Transfer of Care (Signed)
Immediate Anesthesia Transfer of Care Note  Patient: [name]  Procedure(s) Performed: LEFT TOTAL KNEE ARTHROPLASTY (Left Knee)  Patient Location: PACU  Anesthesia Type:GA combined with regional for post-op pain  Level of Consciousness: awake and alert   Airway & Oxygen Therapy: Patient Spontanous Breathing and Patient connected to nasal cannula oxygen  Post-op Assessment: Report given to RN and Post -op Vital signs reviewed and stable  Post vital signs: Reviewed and stable  Last Vitals:  Vitals:   07/28/17 0829  BP: (!) 138/92  Pulse: 85  Resp: 20  Temp: 36.7 C  SpO2: 95%    Last Pain:  Vitals:   07/28/17 0829  TempSrc: Oral      Patients Stated Pain Goal: 3 (29/57/47 3403)  Complications: No apparent anesthesia complications

## 2017-07-28 NOTE — Interval H&P Note (Signed)
History and Physical Interval Note:  07/28/2017 8:56 AM  [name]  has presented today for surgery, with the diagnosis of OA LEFT KNEE  The various methods of treatment have been discussed with the patient and family. After consideration of risks, benefits and other options for treatment, the patient has consented to  Procedure(s): LEFT TOTAL KNEE ARTHROPLASTY (Left) as a surgical intervention .  The patient's history has been reviewed, patient examined, no change in status, stable for surgery.  I have reviewed the patient's chart and labs.  Questions were answered to the patient's satisfaction.     Marilu Rylander D

## 2017-07-28 NOTE — OR Nursing (Signed)
Pt family took all 8 prescriptions with them and will be taking them to get them filled before snow comes.

## 2017-07-28 NOTE — Op Note (Signed)
DATE OF SURGERY:  07/28/2017 TIME: 12:31 PM  PATIENT NAME:  [name]   AGE: 72 y.o.    PRE-OPERATIVE DIAGNOSIS:  OA LEFT KNEE  POST-OPERATIVE DIAGNOSIS:  Same  PROCEDURE:  Procedure(s): LEFT TOTAL KNEE ARTHROPLASTY   SURGEON:  MURPHY, TIMOTHY D, MD   ASSISTANT:  Roxan Hockey, PA-C, he was present and scrubbed throughout the case, critical for completion in a timely fashion, and for retraction, instrumentation, and closure.    OPERATIVE IMPLANTS: [employer]ker Triathlon Posterior Stabilized. Press fit knee  Femur size 3, Tibia size 4, Patella size 29 3-peg oval button, with a 9 mm polyethylene insert.   PREOPERATIVE INDICATIONS:  [name] is a 72 y.o. year old male with end stage bone on bone degenerative arthritis of the knee who failed conservative treatment, including injections, antiinflammatories, activity modification, and assistive devices, and had significant impairment of their activities of daily living, and elected for Total Knee Arthroplasty.   The risks, benefits, and alternatives were discussed at length including but not limited to the risks of infection, bleeding, nerve injury, stiffness, blood clots, the need for revision surgery, cardiopulmonary complications, among others, and they were willing to proceed.   OPERATIVE DESCRIPTION:  The patient was brought to the operative room and placed in a supine position.  General anesthesia was administered.  IV antibiotics were given.  The lower extremity was prepped and draped in the usual sterile fashion.  Time out was performed.  The leg was elevated and exsanguinated and the tourniquet was inflated.  Anterior approach was performed.  The patella was everted and osteophytes were removed.  The anterior horn of the medial and lateral meniscus was removed.   The distal femur was opened with the drill and the intramedullary distal femoral cutting jig was utilized, set at 5 degrees resecting 10 mm off the  distal femur.  Care was taken to protect the collateral ligaments.  The distal femoral sizing jig was applied, taking care to avoid notching.  Then the 4-in-1 cutting jig was applied and the anterior and posterior femur was cut, along with the chamfer cuts.  All posterior osteophytes were removed.  The flexion gap was then measured and was symmetric with the extension gap.  Then the extramedullary tibial cutting jig was utilized making the appropriate cut using the anterior tibial crest as a reference building in appropriate posterior slope.  Care was taken during the cut to protect the medial and collateral ligaments.  The proximal tibia was removed along with the posterior horns of the menisci.  The PCL was sacrificed.    The extensor gap was measured and was approximately 29mm.    I completed the distal femoral preparation using the appropriate jig to prepare the box.  The patella was then measured, and cut with the saw.    The proximal tibia sized and prepared accordingly with the reamer and the punch, and then all components were trialed with the above sized poly insert.  The knee was found to have excellent balance and full motion.    The above named components were then impacted into place and Poly tibial piece and patella were inserted.  I was very happy with his stability and ROM  I performed a periarticular injection with marcaine and toradol  The knee was easily taken through a range of motion and the patella tracked well and the knee irrigated copiously and the parapatellar and subcutaneous tissue closed with vicryl, and monocryl with steri strips for the skin.  The incision was dressed with sterile gauze and the tourniquet released and the patient was awakened and returned to the PACU in stable and satisfactory condition.  There were no complications.  Total tourniquet time was roughly 60 minutes.   POSTOPERATIVE PLAN: post op Abx, DVT px: SCD's, TED's, Early ambulation and  chemical px

## 2017-07-28 NOTE — Anesthesia Preprocedure Evaluation (Addendum)
Anesthesia Evaluation  Patient identified by MRN, date of birth, ID band Patient awake    Reviewed: Allergy & Precautions, H&P , NPO status , Patient's Chart, lab work & pertinent test results, reviewed documented beta blocker date and time   Airway Mallampati: II  TM Distance: >3 FB Neck ROM: Full    Dental no notable dental hx. (+) Edentulous Upper, Edentulous Lower, Dental Advisory Given   Pulmonary neg pulmonary ROS, former smoker,    Pulmonary exam normal breath sounds clear to auscultation       Cardiovascular hypertension, Pt. on medications and Pt. on home beta blockers + CAD  + dysrhythmias Atrial Fibrillation + Valvular Problems/Murmurs AS  Rhythm:Regular Rate:Normal + Systolic murmurs    Neuro/Psych negative neurological ROS  negative psych ROS   GI/Hepatic negative GI ROS, Neg liver ROS,   Endo/Other  negative endocrine ROS  Renal/GU negative Renal ROS  negative genitourinary   Musculoskeletal  (+) Arthritis , Osteoarthritis,    Abdominal   Peds  Hematology negative hematology ROS (+)   Anesthesia Other Findings   Reproductive/Obstetrics negative OB ROS                            Anesthesia Physical Anesthesia Plan  ASA: III  Anesthesia Plan: General   Post-op Pain Management:  Regional for Post-op pain   Induction: Intravenous  PONV Risk Score and Plan: 3 and Ondansetron, Dexamethasone and Midazolam  Airway Management Planned: LMA  Additional Equipment:   Intra-op Plan:   Post-operative Plan: Extubation in OR  Informed Consent: I have reviewed the patients History and Physical, chart, labs and discussed the procedure including the risks, benefits and alternatives for the proposed anesthesia with the patient or authorized representative who has indicated his/her understanding and acceptance.   Dental advisory given  Plan Discussed with: CRNA  Anesthesia Plan  Comments:         Anesthesia Quick Evaluation

## 2017-07-28 NOTE — Addendum Note (Signed)
Addendum  created 07/28/17 1426 by Eligha Bridegroom, CRNA   Intraprocedure Flowsheets edited, Intraprocedure Meds edited

## 2017-07-28 NOTE — Anesthesia Procedure Notes (Signed)
Procedure Name: LMA Insertion Date/Time: 07/28/2017 10:02 AM Performed by: Eligha Bridegroom, CRNA Pre-anesthesia Checklist: Patient identified, Emergency Drugs available, Suction available, Patient being monitored and Timeout performed Patient Re-evaluated:Patient Re-evaluated prior to induction Oxygen Delivery Method: Circle system utilized Preoxygenation: Pre-oxygenation with 100% oxygen Induction Type: IV induction LMA: LMA with gastric port inserted LMA Size: 4.0 Number of attempts: 1 Dental Injury: Teeth and Oropharynx as per pre-operative assessment

## 2017-07-28 NOTE — Progress Notes (Signed)
Orthopedic Tech Progress Note Patient Details:  QUEST TAVENNER 1946-02-16 [id number]  CPM Left Knee CPM Left Knee: On Left Knee Flexion (Degrees): 90 Left Knee Extension (Degrees): 0 Additional Comments: applied cpm at 0-90 degrees on pt left knee/leg.  pt tolerated application well.  bone foam provided at bedside.  left knee.   Post Interventions Patient Tolerated: Well Instructions Provided: Adjustment of device, Care of device  Kristopher Oppenheim 07/28/2017, 12:59 PM

## 2017-07-28 NOTE — Discharge Instructions (Signed)
You may bear weight as tolerated. °Keep your dressing on and dry until follow up. °Take medicine to prevent blood clots as directed. °Take pain medicine as needed with the goal of transitioning to over the counter medicines.  ° °INSTRUCTIONS AFTER JOINT REPLACEMENT  ° °o Remove items at home which could result in a fall. This includes throw rugs or furniture in walking pathways °o ICE to the affected joint every three hours while awake for 30 minutes at a time, for at least the first 3-5 days, and then as needed for pain and swelling.  Continue to use ice for pain and swelling. You may notice swelling that will progress down to the foot and ankle.  This is normal after surgery.  Elevate your leg when you are not up walking on it.   °o Continue to use the breathing machine you got in the hospital (incentive spirometer) which will help keep your temperature down.  It is common for your temperature to cycle up and down following surgery, especially at night when you are not up moving around and exerting yourself.  The breathing machine keeps your lungs expanded and your temperature down. ° ° °DIET:  As you were doing prior to hospitalization, we recommend a well-balanced diet. ° °DRESSING / WOUND CARE / SHOWERING ° °You may shower 3 days after surgery, but keep the wounds dry during showering.  You may use an occlusive plastic wrap (Press'n Seal for example) with blue painter's tape at edges, NO SOAKING/SUBMERGING IN THE BATHTUB.  If the bandage gets wet, change with a clean dry gauze.  If the incision gets wet, pat the wound dry with a clean towel. ° °ACTIVITY ° °o Increase activity slowly as tolerated, but follow the weight bearing instructions below.   °o No driving for 6 weeks or until further direction given by your physician.  You cannot drive while taking narcotics.  °o No lifting or carrying greater than 10 lbs. until further directed by your surgeon. °o Avoid periods of inactivity such as sitting longer than  an hour when not asleep. This helps prevent blood clots.  °o You may return to work once you are authorized by your doctor.  ° ° ° °WEIGHT BEARING  ° °Weight bearing as tolerated with assist device (walker, cane, etc) as directed, use it as long as suggested by your surgeon or therapist, typically at least 4-6 weeks. ° ° °EXERCISES ° °Results after joint replacement surgery are often greatly improved when you follow the exercise, range of motion and muscle strengthening exercises prescribed by your doctor. Safety measures are also important to protect the joint from further injury. Any time any of these exercises cause you to have increased pain or swelling, decrease what you are doing until you are comfortable again and then slowly increase them. If you have problems or questions, call your caregiver or physical therapist for advice.  ° °Rehabilitation is important following a joint replacement. After just a few days of immobilization, the muscles of the leg can become weakened and shrink (atrophy).  These exercises are designed to build up the tone and strength of the thigh and leg muscles and to improve motion. Often times heat used for twenty to thirty minutes before working out will loosen up your tissues and help with improving the range of motion but do not use heat for the first two weeks following surgery (sometimes heat can increase post-operative swelling).  ° °These exercises can be done on a training (exercise)   mat, on the floor, on a table or on a bed. Use whatever works the best and is most comfortable for you.    Use music or television while you are exercising so that the exercises are a pleasant break in your day. This will make your life better with the exercises acting as a break in your routine that you can look forward to.   Perform all exercises about fifteen times, three times per day or as directed.  You should exercise both the operative leg and the other leg as well. ° °Exercises  include: °  °• Quad Sets - Tighten up the muscle on the front of the thigh (Quad) and hold for 5-10 seconds.   °• Straight Leg Raises - With your knee straight (if you were given a brace, keep it on), lift the leg to 60 degrees, hold for 3 seconds, and slowly lower the leg.  Perform this exercise against resistance later as your leg gets stronger.  °• Leg Slides: Lying on your back, slowly slide your foot toward your buttocks, bending your knee up off the floor (only go as far as is comfortable). Then slowly slide your foot back down until your leg is flat on the floor again.  °• Angel Wings: Lying on your back spread your legs to the side as far apart as you can without causing discomfort.  °• Hamstring Strength:  Lying on your back, push your heel against the floor with your leg straight by tightening up the muscles of your buttocks.  Repeat, but this time bend your knee to a comfortable angle, and push your heel against the floor.  You may put a pillow under the heel to make it more comfortable if necessary.  ° °A rehabilitation program following joint replacement surgery can speed recovery and prevent re-injury in the future due to weakened muscles. Contact your doctor or a physical therapist for more information on knee rehabilitation.  ° ° °CONSTIPATION ° °Constipation is defined medically as fewer than three stools per week and severe constipation as less than one stool per week.  Even if you have a regular bowel pattern at home, your normal regimen is likely to be disrupted due to multiple reasons following surgery.  Combination of anesthesia, postoperative narcotics, change in appetite and fluid intake all can affect your bowels.  ° °YOU MUST use at least one of the following options; they are listed in order of increasing strength to get the job done.  They are all available over the counter, and you may need to use some, POSSIBLY even all of these options:   ° °Drink plenty of fluids (prune juice may be  helpful) and high fiber foods °Colace 100 mg by mouth twice a day  °Senokot for constipation as directed and as needed Dulcolax (bisacodyl), take with full glass of water  °Miralax (polyethylene glycol) once or twice a day as needed. ° °If you have tried all these things and are unable to have a bowel movement in the first 3-4 days after surgery call either your surgeon or your primary doctor.   ° °If you experience loose stools or diarrhea, hold the medications until you stool forms back up.  If your symptoms do not get better within 1 week or if they get worse, check with your doctor.  If you experience "the worst abdominal pain ever" or develop nausea or vomiting, please contact the office immediately for further recommendations for treatment. ° ° °ITCHING:  If you   experience itching with your medications, try taking only a single pain pill, or even half a pain pill at a time.  You can also use Benadryl over the counter for itching or also to help with sleep.  ° °TED HOSE STOCKINGS:  Use stockings on both legs until for at least 2 weeks or as directed by physician office. They may be removed at night for sleeping. ° °MEDICATIONS:  See your medication summary on the “After Visit Summary” that nursing will review with you.  You may have some home medications which will be placed on hold until you complete the course of blood thinner medication.  It is important for you to complete the blood thinner medication as prescribed. ° °PRECAUTIONS:  If you experience chest pain or shortness of breath - call 911 immediately for transfer to the hospital emergency department.  ° °If you develop a fever greater that 101 F, purulent drainage from wound, increased redness or drainage from wound, foul odor from the wound/dressing, or calf pain - CONTACT YOUR SURGEON.   °                                                °FOLLOW-UP APPOINTMENTS:  If you do not already have a post-op appointment, please call the office for an  appointment to be seen by your surgeon.  Guidelines for how soon to be seen are listed in your “After Visit Summary”, but are typically between 1-4 weeks after surgery. ° °OTHER INSTRUCTIONS:  ° ° ° °MAKE SURE YOU:  °• Understand these instructions.  °• Get help right away if you are not doing well or get worse.  ° ° °Thank you for letting us be a part of your medical care team.  It is a privilege we respect greatly.  We hope these instructions will help you stay on track for a fast and full recovery!  ° ° ° ° °

## 2017-07-29 ENCOUNTER — Encounter (HOSPITAL_COMMUNITY): Payer: Self-pay | Admitting: Orthopedic Surgery

## 2017-07-29 DIAGNOSIS — M1712 Unilateral primary osteoarthritis, left knee: Secondary | ICD-10-CM | POA: Diagnosis not present

## 2017-07-29 DIAGNOSIS — I10 Essential (primary) hypertension: Secondary | ICD-10-CM | POA: Diagnosis not present

## 2017-07-29 DIAGNOSIS — Z96652 Presence of left artificial knee joint: Secondary | ICD-10-CM | POA: Diagnosis not present

## 2017-07-29 DIAGNOSIS — M25762 Osteophyte, left knee: Secondary | ICD-10-CM | POA: Diagnosis not present

## 2017-07-29 DIAGNOSIS — Z96612 Presence of left artificial shoulder joint: Secondary | ICD-10-CM | POA: Diagnosis not present

## 2017-07-29 DIAGNOSIS — I350 Nonrheumatic aortic (valve) stenosis: Secondary | ICD-10-CM | POA: Diagnosis not present

## 2017-07-29 DIAGNOSIS — E785 Hyperlipidemia, unspecified: Secondary | ICD-10-CM | POA: Diagnosis not present

## 2017-07-29 DIAGNOSIS — I2510 Atherosclerotic heart disease of native coronary artery without angina pectoris: Secondary | ICD-10-CM | POA: Diagnosis not present

## 2017-07-29 DIAGNOSIS — I351 Nonrheumatic aortic (valve) insufficiency: Secondary | ICD-10-CM | POA: Diagnosis not present

## 2017-07-29 DIAGNOSIS — Z96611 Presence of right artificial shoulder joint: Secondary | ICD-10-CM | POA: Diagnosis not present

## 2017-07-29 NOTE — Care Management Note (Signed)
Case Management Note  Patient Details  Name: [name] MRN: [id number] Date of Birth: Mar 24, 1946  Subjective/Objective:                    Action/Plan:   Expected Discharge Date:  07/29/17               Expected Discharge Plan:  [facility]  In-House Referral:  NA  Discharge planning Services  CM Consult  Post Acute Care Choice:  Durable Medical Equipment, Home Health Choice offered to:  Patient  DME Arranged:  3-N-1, Walker rolling, CPM DME Agency:  [employer]  HH Arranged:  PT HH Agency:  [facility]  Status of Service:  Completed, signed off  If discussed at [employer] of Stay Meetings, dates discussed:    Additional Comments:  Ninfa Meeker, RN 07/29/2017, 2:30 PM

## 2017-07-29 NOTE — Evaluation (Signed)
Physical Therapy Evaluation Patient Details Name: [name] MRN: [id number] DOB: [birth year] Today's Date: 07/29/2017   History of Present Illness  Pt is a 72 y/o M s/p elective L TKA. PMHx includes coronary artery disease, essential hypertension, hyperlipidemia, hx of shoulder sx   Clinical Impression  Patient is s/p above surgery resulting in functional limitations due to the deficits listed below (see PT Problem List). PTA pt living with wife ambulating without AD in 1 story home with stairs to enter. Upon eval patient presents with moderate post op pain and weakness that limits his mobility. Currently supervision level of support for OOB mobility including ambulating stairs. Patient repots no concerns for safe return home and feel he will do well with HHPT.  Patient will benefit from skilled PT to increase their independence and safety with mobility to allow discharge to the venue listed below.       Follow Up Recommendations Home health PT;Supervision for mobility/OOB    Equipment Recommendations  None recommended by PT    Recommendations for Other Services       Precautions / Restrictions Precautions Precautions: Fall Precaution Booklet Issued: No Precaution Comments: verbally reviewed knee precautions  Restrictions Weight Bearing Restrictions: Yes LLE Weight Bearing: Weight bearing as tolerated      Mobility  Bed Mobility Overal bed mobility: Needs Assistance Bed Mobility: Supine to Sit     Supine to sit: HOB elevated;Min guard     General bed mobility comments: MinGuard for safety   Transfers Overall transfer level: Needs assistance Equipment used: Rolling walker (2 wheeled) Transfers: Sit to/from Stand Sit to Stand: Min guard         General transfer comment: MinGuard for safety, verbal cues for safe hand/RW placement; Pt stood x3 during session from EOB, BSC, and recliner   Ambulation/Gait Ambulation/Gait assistance: Min  guard;Supervision Ambulation Distance (Feet): 250 Feet Assistive device: Rolling walker (2 wheeled) Gait Pattern/deviations: Step-through pattern;Antalgic Gait velocity: normal   General Gait Details: pt ambulating with step through gait, cues for heel strike, minimal pain and good velocity.   Stairs Stairs: Yes Stairs assistance: Min guard;Supervision Stair Management: One rail Right Number of Stairs: 12 General stair comments: cues for sequencing and use of 1 rail for home set up, patient ambulating stairs with supervision level assistance at this time.  Wheelchair Mobility    Modified Rankin (Stroke Patients Only)       Balance Overall balance assessment: Needs assistance Sitting-balance support: Feet supported Sitting balance-Leahy Scale: Good Sitting balance - Comments: static sitting EOB    Standing balance support: Bilateral upper extremity supported;No upper extremity supported;During functional activity Standing balance-Leahy Scale: Fair Standing balance comment: maintains static standing without UE support with close MinGuard for safety                              Pertinent Vitals/Pain Pain Assessment: Faces Faces Pain Scale: Hurts little more Pain Location: L knee  Pain Descriptors / Indicators: Grimacing;Sore Pain Intervention(s): Limited activity within patient's tolerance;Monitored during session;Premedicated before session    [place] expects to be discharged to:: Private residence Living Arrangements: Spouse/significant other Available Help at Discharge: Family;Available 24 hours/day Type of Home: House Home Access: Stairs to enter Entrance Stairs-Rails: Left Entrance Stairs-Number of Steps: 2 Home Layout: One level Home Equipment: Walker - 2 wheels;Wheelchair - [employer];Other (comment);Walker - 4 wheels;Shower seat - built in      Prior Function Level  of Independence: Independent                Hand Dominance   Dominant Hand: Right    Extremity/Trunk Assessment   Upper Extremity Assessment Upper Extremity Assessment: Overall WFL for tasks assessed    Lower Extremity Assessment Lower Extremity Assessment: (Strength RLE 4/5 LLE 3+/5 gross post op )    Cervical / Trunk Assessment Cervical / Trunk Assessment: Normal  Communication   Communication: No difficulties  Cognition Arousal/Alertness: Awake/alert Behavior During Therapy: WFL for tasks assessed/performed Overall Cognitive Status: Within Functional Limits for tasks assessed                                        General Comments      Exercises Total Joint Exercises Ankle Circles/Pumps: 20 reps Quad Sets: 10 reps Heel Slides: 10 reps Hip ABduction/ADduction: 10 reps Long Arc Quad: 10 reps Knee Flexion: 10 reps Goniometric ROM: 85   Assessment/Plan    PT Assessment All further PT needs can be met in the next venue of care  PT Problem List         PT Treatment Interventions      PT Goals (Current goals can be found in the Care Plan section)  Acute Rehab PT Goals Patient Stated Goal: return home PT Goal Formulation: With patient Time For Goal Achievement: 07/30/17 Potential to Achieve Goals: Good    Frequency     Barriers to discharge        Co-evaluation               AM-PAC PT "6 Clicks" Daily Activity  Outcome Measure Difficulty turning over in bed (including adjusting bedclothes, sheets and blankets)?: A Little Difficulty moving from lying on back to sitting on the side of the bed? : A Little Difficulty sitting down on and standing up from a chair with arms (e.g., wheelchair, bedside commode, etc,.)?: A Little Help needed moving to and from a bed to chair (including a wheelchair)?: A Little Help needed walking in hospital room?: A Little Help needed climbing 3-5 steps with a railing? : A Little 6 Click Score: 18    End of Session Equipment Utilized During  Treatment: Gait belt Activity Tolerance: Patient tolerated treatment well Patient left: in chair;with call bell/phone within reach;with nursing/sitter in room Nurse Communication: Mobility status PT Visit Diagnosis: Unsteadiness on feet (R26.81);Pain;Other abnormalities of gait and mobility (R26.89)    Time: 2010-0712 PT Time Calculation (min) (ACUTE ONLY): 44 min   Charges:   PT Evaluation $PT Eval Low Complexity: 1 Low PT Treatments $Gait Training: 8-22 mins $Therapeutic Exercise: 8-22 mins   PT G Codes:        Reinaldo Berber, PT, DPT Acute Rehab Services Pager: [phone]    Reinaldo Berber 07/29/2017, 10:49 AM

## 2017-07-29 NOTE — Evaluation (Signed)
Occupational Therapy Evaluation Patient Details Name: [name] MRN: [id number] DOB: 18-Jul-1945 Today's Date: 07/29/2017    History of Present Illness Pt is a 72 y/o M s/p elective L TKA. PMHx includes coronary artery disease, essential hypertension, hyperlipidemia, hx of shoulder sx   Clinical Impression   This 72 y/o M presents with the above. At baseline Pt is independent with ADLs and functional mobility. Pt presenting with post-op pain and weakness; completed room level functional mobility, functional transfers and standing ADLs at RW level with MinGuard assist this session, requiring MinA for LB ADLs. Pt will return home with spouse assist who Pt reports is able to provide assist PRN with ADL completion. Education provided and questions answered throughout. Feel Pt is safe to return home from OT standpoint once medically ready. No further acute OT needs identified at this time. Will sign off.     Follow Up Recommendations  DC plan and follow up therapy as arranged by surgeon;Supervision/Assistance - 24 hour    Equipment Recommendations  None recommended by OT           Precautions / Restrictions Precautions Precautions: Fall Precaution Booklet Issued: No Precaution Comments: verbally reviewed knee precautions  Restrictions Weight Bearing Restrictions: Yes LLE Weight Bearing: Weight bearing as tolerated      Mobility Bed Mobility Overal bed mobility: Needs Assistance Bed Mobility: Supine to Sit     Supine to sit: HOB elevated;Min guard     General bed mobility comments: MinGuard for safety   Transfers Overall transfer level: Needs assistance Equipment used: Rolling walker (2 wheeled) Transfers: Sit to/from Stand Sit to Stand: Min guard         General transfer comment: MinGuard for safety, verbal cues for safe hand/RW placement; Pt stood x3 during session from EOB, BSC, and recliner     Balance Overall balance assessment: Needs  assistance Sitting-balance support: Feet supported Sitting balance-Leahy Scale: Good Sitting balance - Comments: static sitting EOB    Standing balance support: Bilateral upper extremity supported;No upper extremity supported;During functional activity Standing balance-Leahy Scale: Fair Standing balance comment: maintains static standing without UE support with close MinGuard for safety                            ADL either performed or assessed with clinical judgement   ADL Overall ADL's : Needs assistance/impaired Eating/Feeding: Modified independent;Sitting   Grooming: Wash/dry hands;Min guard;Standing   Upper Body Bathing: Min guard;Sitting   Lower Body Bathing: Min guard;Sit to/from stand   Upper Body Dressing : Set up;Sitting   Lower Body Dressing: Minimal assistance;Sit to/from stand Lower Body Dressing Details (indicate cue type and reason): steadying assist in standing while Pt advances shorts over hips; educated on compensatory techniques for completing task  Toilet Transfer: Min guard;Ambulation;Regular Toilet;Grab bars;RW   Toileting- Water quality scientist and Hygiene: Min guard;Sit to/from stand Toileting - Clothing Manipulation Details (indicate cue type and reason): Pt performing clothing management and peri-care with MinGuard for safety  Tub/ Shower Transfer: Walk-in shower;Min guard;Ambulation;Rolling walker;Cueing for sequencing Tub/Shower Transfer Details (indicate cue type and reason): Pt return demonstrating transfer, simulated in room; educated on use of 3:1 as shower chair initially for increased safety during task completion  Functional mobility during ADLs: Min guard;Rolling walker General ADL Comments: education provided on safety and compensatory techniques for completing ADLs and functional transfers  Pertinent Vitals/Pain Pain Assessment: Faces Faces Pain Scale: Hurts little more Pain Location: L knee  Pain  Descriptors / Indicators: Grimacing;Sore Pain Intervention(s): Limited activity within patient's tolerance;Monitored during session;Ice applied     Hand Dominance (P) Right   Extremity/Trunk Assessment Upper Extremity Assessment Upper Extremity Assessment: Overall WFL for tasks assessed   Lower Extremity Assessment Lower Extremity Assessment: Defer to PT evaluation   Cervical / Trunk Assessment Cervical / Trunk Assessment: (P) Normal   Communication Communication Communication: (P) No difficulties   Cognition Arousal/Alertness: Awake/alert Behavior During Therapy: WFL for tasks assessed/performed Overall Cognitive Status: Within Functional Limits for tasks assessed                                                      Home Living Family/patient expects to be discharged to:: Private residence Living Arrangements: Spouse/significant other Available Help at Discharge: Family;Available 24 hours/day Type of Home: House Home Access: Stairs to enter [employer] of Steps: 2 Entrance Stairs-Rails: Left Home Layout: One level     Bathroom Shower/Tub: Walk-in shower;Tub/shower unit   Bathroom Toilet: Standard     Home Equipment: Environmental consultant - 2 wheels;Wheelchair - [employer];Other (comment);Walker - 4 wheels;Shower seat - built in          Prior Functioning/Environment Level of Independence: (P) Independent                 OT Problem List: Decreased strength;Impaired balance (sitting and/or standing);Decreased activity tolerance;Decreased knowledge of use of DME or AE            OT Goals(Current goals can be found in the care plan section) Acute Rehab OT Goals Patient Stated Goal: return home OT Goal Formulation: All assessment and education complete, DC therapy                                 AM-PAC PT "6 Clicks" Daily Activity     Outcome Measure Help from another person eating meals?: None Help from  another person taking care of personal grooming?: A Little Help from another person toileting, which includes using toliet, bedpan, or urinal?: A Little Help from another person bathing (including washing, rinsing, drying)?: A Little Help from another person to put on and taking off regular upper body clothing?: None Help from another person to put on and taking off regular lower body clothing?: A Little 6 Click Score: 20   End of Session Equipment Utilized During Treatment: Gait belt;Rolling walker Nurse Communication: Mobility status  Activity Tolerance: Patient tolerated treatment well Patient left: in chair;with call bell/phone within reach  OT Visit Diagnosis: Other abnormalities of gait and mobility (R26.89)                Time: 8676-7209 OT Time Calculation (min): 31 min Charges:  OT General Charges $OT Visit: 1 Visit OT Evaluation $OT Eval Low Complexity: 1 Low OT Treatments $Self Care/Home Management : 8-22 mins G-Codes:     Lou Cal, OT Pager [phone] 07/29/2017   [name] 07/29/2017, 9:55 AM

## 2017-07-29 NOTE — Discharge Summary (Signed)
Discharge Summary  Patient ID: [name] MRN: [id number] DOB/AGE: 1945/07/09 72 y.o.  Admit date: 07/28/2017 Discharge date: 07/29/2017  Admission Diagnoses:  Primary osteoarthritis of left knee  Discharge Diagnoses:  Principal Problem:   Primary osteoarthritis of left knee Active Problems:   Hyperlipidemia   Paroxysmal atrial fibrillation (HCC)   Aortic stenosis   Essential hypertension   Primary osteoarthritis of knee   Past Medical History:  Diagnosis Date  . Allergic rhinitis 08/08/2013   takes Loratadine daily   . Aortic stenosis 09/08/2011   With mild aortic regurgitation, mean gradient 13 mmHg   . Cataract    right but immature  . Complication of anesthesia    stopped breathing - during shoulder surgery 2009-2010  . Coronary artery disease 10/02/2009   Cardiac cath (October 2007): 20% left main, 60% mid LAD, 60-70% distal LAD, 30-40% first diagonal, 30% circumflex, 40% OM, 30-40% RCA   . Degenerative joint disease of shoulder 08/03/2013   Bilateral, s/p right total shoulder arthroplasty 05/29/2011 and left shoulder arthroplasty 06/08/2014  . Diastolic dysfunction 27/07/5364   Echo (04/04/2016): Grade I  . Diverticulosis 10/07/2013   Seen on colonoscopy in 2007   . Erectile dysfunction 05/07/2009  . Essential hypertension 08/03/2013   had been on Lisinopril but stopped per MD  . First degree AV block 03/14/2016  . Gastric ulcer 10/07/2013   Seen on EGD 04/10/2006   . Hemorrhoids 10/07/2013  . Hyperlipidemia 10/02/2009  . Paroxysmal atrial fibrillation ([place]) 07/14/2006  . Sciatica associated with disorder of lumbar spine 08/03/2013   Anterolisthesis with right L 4-5 nerve root compression.  Treated with epidural injections and Physical Therapy.   . Seborrheic keratosis 10/07/2013  . Tubular adenoma of colon     Surgeries: Procedure(s): LEFT TOTAL KNEE ARTHROPLASTY on 07/28/2017   Consultants (if any):   Discharged Condition: Improved  Hospital Course: [name] is an 72 y.o. male who was admitted 07/28/2017 with a diagnosis of Primary osteoarthritis of left knee and went to the operating room on 07/28/2017 and underwent the above named procedures.    He was given perioperative antibiotics:  Anti-infectives (From admission, onward)   Start     Dose/Rate Route Frequency Ordered Stop   07/28/17 1800  ceFAZolin (ANCEF) IVPB 1 g/50 mL premix     1 g 100 mL/hr over 30 Minutes Intravenous Every 6 hours 07/28/17 1654 07/29/17 0039   07/28/17 0900  ceFAZolin (ANCEF) IVPB 2g/100 mL premix     2 g 200 mL/hr over 30 Minutes Intravenous To [facility] 07/27/17 0923 07/28/17 1000    .  He was given sequential compression devices, early ambulation, and ASA for DVT prophylaxis.  He benefited maximally from the hospital stay and there were no complications.    Recent vital signs:  Vitals:   07/28/17 2020 07/29/17 0412  BP: 136/77 (!) 150/75  Pulse: 86 89  Resp: 18 18  Temp: (!) 97.1 F (36.2 C) 98.2 F (36.8 C)  SpO2: 93% 95%    Recent laboratory studies:  Lab Results  Component Value Date   HGB 15.8 07/14/2017   HGB 15.2 05/31/2014   HGB 15.0 10/07/2013   Lab Results  Component Value Date   WBC 9.7 07/14/2017   PLT 168 07/14/2017   Lab Results  Component Value Date   INR 1.11 05/31/2014   Lab Results  Component Value Date   NA 139 07/14/2017   K 3.9 07/14/2017   CL 107 07/14/2017  CO2 21 (L) 07/14/2017   BUN 20 07/14/2017   CREATININE 1.10 07/14/2017   GLUCOSE 165 (H) 07/14/2017    Discharge Medications:   Allergies as of 07/29/2017   No Known Allergies     Medication List    STOP taking these medications   naproxen sodium 220 MG tablet Commonly known as:  ALEVE     TAKE these medications   acetaminophen 500 MG tablet Commonly known as:  TYLENOL Take 2 tablets (1,000 mg total) by mouth every 8 (eight) hours for 14 days. For Pain.   aspirin EC 325 MG tablet Take 1 tablet (325 mg total) by mouth daily.  For DVT prophylaxis What changed:  additional instructions   atorvastatin 40 MG tablet Commonly known as:  LIPITOR Take 1 tablet (40 mg total) by mouth daily.   baclofen 10 MG tablet Commonly known as:  LIORESAL Take 1 tablet (10 mg total) by mouth 3 (three) times daily as needed for muscle spasms.   carvedilol 12.5 MG tablet Commonly known as:  COREG Take 1 tablet (12.5 mg total) by mouth 2 (two) times daily with a meal.   celecoxib 200 MG capsule Commonly known as:  CELEBREX Take 1 capsule (200 mg total) by mouth 2 (two) times daily. For 2 weeks post op for pain and inflammation.  Discontinue Ibuprofen or other Anti-inflammatory medicine when taking this medicine.   docusate sodium 100 MG capsule Commonly known as:  COLACE Take 1 capsule (100 mg total) by mouth 2 (two) times daily. To prevent constipation while taking pain medication.   omeprazole 20 MG capsule Commonly known as:  PRILOSEC Take 1 capsule (20 mg total) by mouth daily. 30 days for gastroprotection while taking NSAIDs.   ondansetron 4 MG tablet Commonly known as:  ZOFRAN Take 1 tablet (4 mg total) by mouth every 8 (eight) hours as needed for nausea or vomiting.   oxyCODONE 5 MG immediate release tablet Commonly known as:  ROXICODONE Take 1-2 tablets (5-10 mg total) by mouth every 4 (four) hours as needed for breakthrough pain.       Diagnostic Studies: Dg Knee Left Port  Result Date: 07/28/2017 CLINICAL DATA:  Left knee arthroplasty. EXAM: PORTABLE LEFT KNEE - 1-2 VIEW COMPARISON:  No recent prior. FINDINGS: Total left knee replacement. Hardware intact. Anatomic alignment. Peripheral vascular calcification. IMPRESSION: Total left knee replacement with anatomic alignment. Electronically Signed   By: Marcello Moores  Register   On: 07/28/2017 12:31    Disposition: 01-Home or Self Care  Discharge Instructions    Discharge patient   Complete by:  As directed    After therapy sessions   Discharge disposition:   01-Home or Self Care   Discharge patient date:  07/29/2017      Follow-up Information    Renette Butters, MD.   Specialty:  Orthopedic Surgery Contact information: [address] [id number] [phone]            Signed: Prudencio Burly III PA-C 07/29/2017, 7:40 AM

## 2017-07-29 NOTE — Progress Notes (Signed)
   Assessment / Plan: 1 Day Post-Op  S/P Procedure(s) (LRB): LEFT TOTAL KNEE ARTHROPLASTY (Left) by Dr. Ernesta Amble. Percell Miller on 07/28/2017  Principal Problem:   Primary osteoarthritis of left knee Active Problems:   Hyperlipidemia   Paroxysmal atrial fibrillation (HCC)   Aortic stenosis   Essential hypertension   Primary osteoarthritis of knee   Status post left total knee arthroplasty Doing well postop day 1. Eating, drinking, and voiding.  Up out of bed mobilizing well in the room with his walker.  Advance diet Up with therapy D/C IV fluids Incentive Spirometry Elevate and apply ice  Weight Bearing: Weight Bearing as Tolerated (WBAT)  Dressings: Maintain Mepilex  VTE prophylaxis: Aspirin, SCDs, ambulation Dispo: Home after therapy sessions.    Subjective: Patient reports pain as mild to moderate.  Tolerating diet.  Urinating.  +Flatus.  No CP, SOB.  OOB in room..  Objective:   VITALS:   Vitals:   07/28/17 1630 07/28/17 1653 07/28/17 2020 07/29/17 0412  BP: (!) 130/94 (!) 157/77 136/77 (!) 150/75  Pulse: 79 85 86 89  Resp: (!) 22 20 18 18   Temp: 97.9 F (36.6 C) 97.9 F (36.6 C) (!) 97.1 F (36.2 C) 98.2 F (36.8 C)  TempSrc:  Oral Oral Oral  SpO2: 94% 94% 93% 95%  Weight:      Height:       CBC Latest Ref Rng & Units 07/14/2017 05/31/2014 10/07/2013  WBC 4.0 - 10.5 K/uL 9.7 7.6 11.1(H)  Hemoglobin 13.0 - 17.0 g/dL 15.8 15.2 15.0  Hematocrit 39.0 - 52.0 % 45.4 44.0 42.7  Platelets 150 - 400 K/uL 168 156 203   BMP Latest Ref Rng & Units 07/14/2017 11/13/2014 10/13/2014  Glucose 65 - 99 mg/dL 165(H) 136(H) 74  BUN 6 - 20 mg/dL 20 19 21   Creatinine 0.61 - 1.24 mg/dL 1.10 1.01 1.03  Sodium 135 - 145 mmol/L 139 139 139  Potassium 3.5 - 5.1 mmol/L 3.9 4.1 4.4  Chloride 101 - 111 mmol/L 107 105 104  CO2 22 - 32 mmol/L 21(L) 23 27  Calcium 8.9 - 10.3 mg/dL 9.2 9.1 9.2   Intake/Output      01/29 0701 - 01/30 0700 01/30 0701 - 01/31 0700   P.O. 450    I.V.  (mL/kg) 1700 (19.8)    Total Intake(mL/kg) 2150 (25)    Urine (mL/kg/hr) 0    Blood 30    Total Output 30    Net +2120         Urine Occurrence 5 x       Physical Exam: General: NAD.  Upright in bed.  Calm, conversant.  CPM in place.  No increased work of breathing MSK Neurovascularly intact Sensation intact distally Intact pulses distally Dorsiflexion/Plantar flexion intact Incision: dressing C/D/I   Prudencio Burly III, PA-C 07/29/2017, 7:35 AM

## 2017-07-29 NOTE — Progress Notes (Signed)
Discharge instructions reviewed with pt and his wife. Copy of instructions  given to pt, wife took scripts yesterday pt states and had them filled already. Pt d/c'd via wheelchair with belongings, with wife.           Escorted by unit NT.   Pt discharged after CM came to room and discussed home care needs and made arrangements for.

## 2017-07-30 DIAGNOSIS — I350 Nonrheumatic aortic (valve) stenosis: Secondary | ICD-10-CM | POA: Diagnosis not present

## 2017-07-30 DIAGNOSIS — Z96652 Presence of left artificial knee joint: Secondary | ICD-10-CM | POA: Diagnosis not present

## 2017-07-30 DIAGNOSIS — I25118 Atherosclerotic heart disease of native coronary artery with other forms of angina pectoris: Secondary | ICD-10-CM | POA: Diagnosis not present

## 2017-07-30 DIAGNOSIS — Z7982 Long term (current) use of aspirin: Secondary | ICD-10-CM | POA: Diagnosis not present

## 2017-07-30 DIAGNOSIS — I480 Paroxysmal atrial fibrillation: Secondary | ICD-10-CM | POA: Diagnosis not present

## 2017-07-30 DIAGNOSIS — Z471 Aftercare following joint replacement surgery: Secondary | ICD-10-CM | POA: Diagnosis not present

## 2017-07-30 DIAGNOSIS — I10 Essential (primary) hypertension: Secondary | ICD-10-CM | POA: Diagnosis not present

## 2017-08-03 DIAGNOSIS — I25118 Atherosclerotic heart disease of native coronary artery with other forms of angina pectoris: Secondary | ICD-10-CM | POA: Diagnosis not present

## 2017-08-03 DIAGNOSIS — Z96652 Presence of left artificial knee joint: Secondary | ICD-10-CM | POA: Diagnosis not present

## 2017-08-03 DIAGNOSIS — I480 Paroxysmal atrial fibrillation: Secondary | ICD-10-CM | POA: Diagnosis not present

## 2017-08-03 DIAGNOSIS — I350 Nonrheumatic aortic (valve) stenosis: Secondary | ICD-10-CM | POA: Diagnosis not present

## 2017-08-03 DIAGNOSIS — Z7982 Long term (current) use of aspirin: Secondary | ICD-10-CM | POA: Diagnosis not present

## 2017-08-03 DIAGNOSIS — I10 Essential (primary) hypertension: Secondary | ICD-10-CM | POA: Diagnosis not present

## 2017-08-03 DIAGNOSIS — Z471 Aftercare following joint replacement surgery: Secondary | ICD-10-CM | POA: Diagnosis not present

## 2017-08-04 ENCOUNTER — Other Ambulatory Visit: Payer: Self-pay

## 2017-08-04 DIAGNOSIS — I350 Nonrheumatic aortic (valve) stenosis: Secondary | ICD-10-CM | POA: Diagnosis not present

## 2017-08-04 DIAGNOSIS — Z7982 Long term (current) use of aspirin: Secondary | ICD-10-CM | POA: Diagnosis not present

## 2017-08-04 DIAGNOSIS — I10 Essential (primary) hypertension: Secondary | ICD-10-CM | POA: Diagnosis not present

## 2017-08-04 DIAGNOSIS — I480 Paroxysmal atrial fibrillation: Secondary | ICD-10-CM | POA: Diagnosis not present

## 2017-08-04 DIAGNOSIS — Z471 Aftercare following joint replacement surgery: Secondary | ICD-10-CM | POA: Diagnosis not present

## 2017-08-04 DIAGNOSIS — I25118 Atherosclerotic heart disease of native coronary artery with other forms of angina pectoris: Secondary | ICD-10-CM | POA: Diagnosis not present

## 2017-08-04 DIAGNOSIS — Z96652 Presence of left artificial knee joint: Secondary | ICD-10-CM | POA: Diagnosis not present

## 2017-08-04 NOTE — Patient Outreach (Signed)
[facility]) Care Management  08/04/2017  [name] 25-Oct-1945 [id number]   Subjective: "Everything is going well".  Objective: none  Assessment: 72 year old with recent observation admission for left total knee arthroplasty. RNCM called for transition of care. Client denies any issues or concerns. He reports that he is active with [facility]. He states he is using his knee flexor machine without difficulty.   Medications reviewed. Client with no questions or issues noted.  Client voices pain "not bad, about a 1-3". He reports he is using his cane to get around. RNCM reinforced the importance of fall prevention strategies. Client voices understanding. He denies any questions or concerns.  He reports his dressing is still dry/intact over the knee. He states his follow up appointment is scheduled for February13th.   RNCM provided contact information and will send outreach letter, but no needs identified. RNCM encouraged client to call if needs change. RNCM reinforced availability of the 24 hour nurse advice line and encouraged client to call as needed. Client reports he is very familiar with the [facility] program and would call if needed.  Plan: RNCM will not open case. RNCM will send successful outreach letter.  Thea Silversmith, RN, MSN, [place] Coordinator Cell: [phone]

## 2017-08-07 DIAGNOSIS — Z471 Aftercare following joint replacement surgery: Secondary | ICD-10-CM | POA: Diagnosis not present

## 2017-08-07 DIAGNOSIS — Z96652 Presence of left artificial knee joint: Secondary | ICD-10-CM | POA: Diagnosis not present

## 2017-08-07 DIAGNOSIS — I25118 Atherosclerotic heart disease of native coronary artery with other forms of angina pectoris: Secondary | ICD-10-CM | POA: Diagnosis not present

## 2017-08-07 DIAGNOSIS — I10 Essential (primary) hypertension: Secondary | ICD-10-CM | POA: Diagnosis not present

## 2017-08-07 DIAGNOSIS — I480 Paroxysmal atrial fibrillation: Secondary | ICD-10-CM | POA: Diagnosis not present

## 2017-08-07 DIAGNOSIS — Z7982 Long term (current) use of aspirin: Secondary | ICD-10-CM | POA: Diagnosis not present

## 2017-08-07 DIAGNOSIS — I350 Nonrheumatic aortic (valve) stenosis: Secondary | ICD-10-CM | POA: Diagnosis not present

## 2017-08-10 DIAGNOSIS — Z471 Aftercare following joint replacement surgery: Secondary | ICD-10-CM | POA: Diagnosis not present

## 2017-08-10 DIAGNOSIS — I350 Nonrheumatic aortic (valve) stenosis: Secondary | ICD-10-CM | POA: Diagnosis not present

## 2017-08-10 DIAGNOSIS — I480 Paroxysmal atrial fibrillation: Secondary | ICD-10-CM | POA: Diagnosis not present

## 2017-08-10 DIAGNOSIS — Z96652 Presence of left artificial knee joint: Secondary | ICD-10-CM | POA: Diagnosis not present

## 2017-08-10 DIAGNOSIS — I25118 Atherosclerotic heart disease of native coronary artery with other forms of angina pectoris: Secondary | ICD-10-CM | POA: Diagnosis not present

## 2017-08-10 DIAGNOSIS — I10 Essential (primary) hypertension: Secondary | ICD-10-CM | POA: Diagnosis not present

## 2017-08-10 DIAGNOSIS — Z7982 Long term (current) use of aspirin: Secondary | ICD-10-CM | POA: Diagnosis not present

## 2017-08-12 DIAGNOSIS — I25118 Atherosclerotic heart disease of native coronary artery with other forms of angina pectoris: Secondary | ICD-10-CM | POA: Diagnosis not present

## 2017-08-12 DIAGNOSIS — Z7982 Long term (current) use of aspirin: Secondary | ICD-10-CM | POA: Diagnosis not present

## 2017-08-12 DIAGNOSIS — I350 Nonrheumatic aortic (valve) stenosis: Secondary | ICD-10-CM | POA: Diagnosis not present

## 2017-08-12 DIAGNOSIS — M1712 Unilateral primary osteoarthritis, left knee: Secondary | ICD-10-CM | POA: Diagnosis not present

## 2017-08-12 DIAGNOSIS — I480 Paroxysmal atrial fibrillation: Secondary | ICD-10-CM | POA: Diagnosis not present

## 2017-08-12 DIAGNOSIS — Z96652 Presence of left artificial knee joint: Secondary | ICD-10-CM | POA: Diagnosis not present

## 2017-08-12 DIAGNOSIS — I10 Essential (primary) hypertension: Secondary | ICD-10-CM | POA: Diagnosis not present

## 2017-08-12 DIAGNOSIS — Z471 Aftercare following joint replacement surgery: Secondary | ICD-10-CM | POA: Diagnosis not present

## 2017-08-14 DIAGNOSIS — I350 Nonrheumatic aortic (valve) stenosis: Secondary | ICD-10-CM | POA: Diagnosis not present

## 2017-08-14 DIAGNOSIS — I10 Essential (primary) hypertension: Secondary | ICD-10-CM | POA: Diagnosis not present

## 2017-08-14 DIAGNOSIS — I480 Paroxysmal atrial fibrillation: Secondary | ICD-10-CM | POA: Diagnosis not present

## 2017-08-14 DIAGNOSIS — I25118 Atherosclerotic heart disease of native coronary artery with other forms of angina pectoris: Secondary | ICD-10-CM | POA: Diagnosis not present

## 2017-08-14 DIAGNOSIS — Z96652 Presence of left artificial knee joint: Secondary | ICD-10-CM | POA: Diagnosis not present

## 2017-08-14 DIAGNOSIS — Z471 Aftercare following joint replacement surgery: Secondary | ICD-10-CM | POA: Diagnosis not present

## 2017-08-14 DIAGNOSIS — Z7982 Long term (current) use of aspirin: Secondary | ICD-10-CM | POA: Diagnosis not present

## 2017-09-11 DIAGNOSIS — M1712 Unilateral primary osteoarthritis, left knee: Secondary | ICD-10-CM | POA: Diagnosis not present

## 2017-11-02 DIAGNOSIS — M545 Low back pain: Secondary | ICD-10-CM | POA: Diagnosis not present

## 2017-11-16 DIAGNOSIS — M545 Low back pain: Secondary | ICD-10-CM | POA: Diagnosis not present

## 2018-03-19 ENCOUNTER — Encounter: Payer: Self-pay | Admitting: Internal Medicine

## 2018-03-19 ENCOUNTER — Ambulatory Visit (INDEPENDENT_AMBULATORY_CARE_PROVIDER_SITE_OTHER): Payer: Medicare HMO | Admitting: Internal Medicine

## 2018-03-19 DIAGNOSIS — I10 Essential (primary) hypertension: Secondary | ICD-10-CM | POA: Diagnosis not present

## 2018-03-19 DIAGNOSIS — Z79899 Other long term (current) drug therapy: Secondary | ICD-10-CM

## 2018-03-19 DIAGNOSIS — Z791 Long term (current) use of non-steroidal anti-inflammatories (NSAID): Secondary | ICD-10-CM

## 2018-03-19 DIAGNOSIS — R011 Cardiac murmur, unspecified: Secondary | ICD-10-CM | POA: Diagnosis not present

## 2018-03-19 DIAGNOSIS — Z7982 Long term (current) use of aspirin: Secondary | ICD-10-CM

## 2018-03-19 DIAGNOSIS — I351 Nonrheumatic aortic (valve) insufficiency: Secondary | ICD-10-CM

## 2018-03-19 DIAGNOSIS — I25118 Atherosclerotic heart disease of native coronary artery with other forms of angina pectoris: Secondary | ICD-10-CM

## 2018-03-19 DIAGNOSIS — Z0000 Encounter for general adult medical examination without abnormal findings: Secondary | ICD-10-CM

## 2018-03-19 DIAGNOSIS — D369 Benign neoplasm, unspecified site: Secondary | ICD-10-CM

## 2018-03-19 DIAGNOSIS — E663 Overweight: Secondary | ICD-10-CM | POA: Diagnosis not present

## 2018-03-19 DIAGNOSIS — I350 Nonrheumatic aortic (valve) stenosis: Secondary | ICD-10-CM

## 2018-03-19 DIAGNOSIS — Z96652 Presence of left artificial knee joint: Secondary | ICD-10-CM

## 2018-03-19 DIAGNOSIS — M170 Bilateral primary osteoarthritis of knee: Secondary | ICD-10-CM

## 2018-03-19 DIAGNOSIS — Z6829 Body mass index (BMI) 29.0-29.9, adult: Secondary | ICD-10-CM

## 2018-03-19 DIAGNOSIS — Z86018 Personal history of other benign neoplasm: Secondary | ICD-10-CM

## 2018-03-19 DIAGNOSIS — E785 Hyperlipidemia, unspecified: Secondary | ICD-10-CM

## 2018-03-19 DIAGNOSIS — E7800 Pure hypercholesterolemia, unspecified: Secondary | ICD-10-CM

## 2018-03-19 DIAGNOSIS — I25119 Atherosclerotic heart disease of native coronary artery with unspecified angina pectoris: Secondary | ICD-10-CM

## 2018-03-19 MED ORDER — ATORVASTATIN CALCIUM 40 MG PO TABS: 40.0000 mg | ORAL_TABLET | Freq: Every day | ORAL | 3 refills | Status: DC

## 2018-03-19 MED ORDER — CARVEDILOL 12.5 MG PO TABS: 12.5000 mg | ORAL_TABLET | Freq: Two times a day (BID) | ORAL | 3 refills | Status: DC

## 2018-03-19 NOTE — Assessment & Plan Note (Signed)
Assessment  He has had no anginal symptoms on his current regimen of aspirin 325 mg by mouth daily, atorvastatin 40 mg by mouth daily, and carvedilol 12.5 mg by mouth twice daily.  Plan  We will continue the current antianginal regimen of aspirin 325 mg by mouth daily, atorvastatin 40 mg by mouth daily, and carvedilol 12.5 mg by mouth twice daily and reassess the efficacy of this regimen in controlling any anginal symptoms at the follow-up visit.

## 2018-03-19 NOTE — Assessment & Plan Note (Signed)
He received a flu vaccination today.  He remains uninterested in a surveillance colonoscopy.

## 2018-03-19 NOTE — Assessment & Plan Note (Signed)
Assessment  His blood pressure today is at target at 130/71.  This is on carvedilol 12.5 mg by mouth twice daily.  Plan  We will continue the carvedilol at 12.5 mg by mouth twice daily and reassess the efficacy of this regimen with a repeat blood pressure at the follow-up visit.

## 2018-03-19 NOTE — Assessment & Plan Note (Signed)
Assessment  He is tolerating the atorvastatin 40 mg by mouth daily without myalgias.  Plan  We will continue with this high intensity statin and reassess for intolerances at the follow-up visit. 

## 2018-03-19 NOTE — Assessment & Plan Note (Signed)
Assessment  I again discussed the recommendation of a surveillance colonoscopy given his history of tubular adenoma.  He remains uninterested in any further colonoscopies at this time.  Plan  I will reassess his willingness to undergo a surveillance colonoscopy at the follow-up visit.

## 2018-03-19 NOTE — Assessment & Plan Note (Signed)
Assessment  He has had no chest pain, syncope, or heart failure symptoms.  The last echocardiogram in 2017 revealed only mild aortic regurgitation with a mean gradient of 10 mmHg.  Plan  At the follow-up visit in 1 year we will order a repeat echocardiogram to reassess the aortic stenosis with regurgitation.  Where he to developed chest pain, syncope, or heart failure symptoms he will call the clinic and we will assess his cardiac function via echocardiography more quickly.

## 2018-03-19 NOTE — Assessment & Plan Note (Signed)
Assessment  His left knee pain has resolved after the left total knee arthroplasty.  He continues to have some pain in the right knee for which he takes as needed ibuprofen.  He states the pain is nowhere near the pain he had in the left knee and not requiring a total knee arthroplasty at this time.  Plan  We will continue with the as needed ibuprofen for right knee pain as well as continued hydrotherapy in the pool.  We will reassess his pain at the follow-up visit.

## 2018-03-19 NOTE — Patient Instructions (Signed)
It was great to see you.  You look great after the knee surgery.  1) Keep taking the medications as you are.  2) Keep working on the slow weight loss.  Watching your portions is also working.  I will see you in 1 year, sooner if necessary.

## 2018-03-19 NOTE — Assessment & Plan Note (Signed)
Assessment  He has lost 4 pounds in the last 8 months.  He has done this via portion control and exercise, made easier after his left total knee arthroplasty.  He does significant work in the pool, but will also do activities outside of the pool as well.  Plan  He was praised for his weight loss and encouraged to continue with the lifestyle modifications including portion control and continued regular exercise 4-5 times per week.  We will reassess the efficacy of these lifestyle changes at the follow-up visit with a repeat weight.  His ultimate goal is to be 165 pounds.  This is 20 pounds from here and he states he will slowly work to get to that goal.

## 2018-03-19 NOTE — Progress Notes (Signed)
   Subjective:    Patient ID: [name], male    DOB: 01-17-1946, 72 y.o.   MRN: [id number]  HPI  [name] is here for follow-up of his coronary artery disease with stable angina, aortic stenosis, essential hypertension, history of tubular adenoma, overweight status, and osteoarthritis of the knees. Please see the A&P for the status of the pt's chronic medical problems.  Review of Systems  Constitutional: Negative for activity change, appetite change, fatigue and unexpected weight change.  HENT: Positive for congestion, rhinorrhea and sinus pressure.   Respiratory: Negative for chest tightness, shortness of breath and wheezing.   Cardiovascular: Negative for chest pain, palpitations and leg swelling.  Gastrointestinal: Negative for abdominal distention, abdominal pain, constipation, diarrhea, nausea and vomiting.  Musculoskeletal: Positive for arthralgias and back pain. Negative for gait problem, joint swelling and myalgias.  Skin: Negative for rash and wound.  Allergic/Immunologic: Positive for environmental allergies.  Neurological: Negative for weakness.  Psychiatric/Behavioral: Negative for dysphoric mood and sleep disturbance. The patient is not nervous/anxious.       Objective:   Physical Exam  Constitutional: He is oriented to person, place, and time. He appears well-developed and well-nourished. No distress.  HENT:  Head: Normocephalic and atraumatic.  Eyes: Right eye exhibits no discharge. Left eye exhibits no discharge. No scleral icterus.  Cardiovascular: Normal rate and regular rhythm. Exam reveals no gallop and no friction rub.  Murmur heard. II/VY systolic crescendo-decrescendo murmur with a +S2 component best heard at the RUSB and LLSB.  Pulmonary/Chest: Effort normal and breath sounds normal. No stridor. No respiratory distress. He has no wheezes. He has no rales.  Musculoskeletal: Normal range of motion. He exhibits no edema, tenderness or deformity.    Neurological: He is alert and oriented to person, place, and time. He exhibits normal muscle tone.  Skin: Skin is warm and dry. No rash noted. He is not diaphoretic. No erythema. No pallor.  Psychiatric: He has a normal mood and affect. His behavior is normal. Judgment and thought content normal.  Nursing note and vitals reviewed.     Assessment & Plan:   Please see problem oriented charting.

## 2018-09-26 ENCOUNTER — Encounter: Payer: Self-pay | Admitting: *Deleted

## 2018-11-26 ENCOUNTER — Other Ambulatory Visit: Payer: Self-pay

## 2018-11-26 ENCOUNTER — Encounter (HOSPITAL_COMMUNITY): Payer: Self-pay

## 2018-11-26 ENCOUNTER — Telehealth: Payer: Self-pay

## 2018-11-26 ENCOUNTER — Ambulatory Visit (HOSPITAL_COMMUNITY)
Admission: EM | Admit: 2018-11-26 | Discharge: 2018-11-26 | Disposition: A | Discharge status: Home / Self Care | Payer: Medicare HMO | Attending: Family Medicine | Admitting: Family Medicine

## 2018-11-26 DIAGNOSIS — M109 Gout, unspecified: Secondary | ICD-10-CM

## 2018-11-26 MED ORDER — INDOMETHACIN 50 MG PO CAPS: 50.0000 mg | ORAL_CAPSULE | Freq: Two times a day (BID) | ORAL | 1 refills | Status: DC

## 2018-11-26 NOTE — ED Provider Notes (Signed)
[place]    CSN: [id number] Arrival date & time: 11/26/18  1027     History   Chief Complaint Chief Complaint  Patient presents with  . Gout Flare Up    HPI [name] is a 73 y.o. male.   HPI  See history Recurrent gout, left big toe Wants colchicine at suggestion of daughter who is a nurse No trauma, thinks it is from too many strawberries  Past Medical History:  Diagnosis Date  . Allergic rhinitis 08/08/2013   takes Loratadine daily   . Aortic stenosis 09/08/2011   With mild aortic regurgitation, mean gradient 13 mmHg   . Cataract    right but immature  . Complication of anesthesia    stopped breathing - during shoulder surgery 2009-2010  . Coronary artery disease 10/02/2009   Cardiac cath (October 2007): 20% left main, 60% mid LAD, 60-70% distal LAD, 30-40% first diagonal, 30% circumflex, 40% OM, 30-40% RCA   . Degenerative joint disease of shoulder 08/03/2013   Bilateral, s/p right total shoulder arthroplasty 05/29/2011 and left shoulder arthroplasty 06/08/2014  . Diastolic dysfunction 09/02/4654   Echo (04/04/2016): Grade I  . Diverticulosis 10/07/2013   Seen on colonoscopy in 2007   . Erectile dysfunction 05/07/2009  . Essential hypertension 08/03/2013   had been on Lisinopril but stopped per MD  . First degree AV block 03/14/2016  . Gastric ulcer 10/07/2013   Seen on EGD 04/10/2006   . Hemorrhoids 10/07/2013  . Hyperlipidemia 10/02/2009  . Paroxysmal atrial fibrillation ([place]) 07/14/2006  . Sciatica associated with disorder of lumbar spine 08/03/2013   Anterolisthesis with right L 4-5 nerve root compression.  Treated with epidural injections and Physical Therapy.   . Seborrheic keratosis 10/07/2013  . Tubular adenoma of colon     Patient Active Problem List   Diagnosis Date Noted  . Diastolic dysfunction 81/27/5170  . First degree AV block 03/14/2016  . Osteoarthritis of both knees 12/12/2014  . Diverticulosis 10/07/2013  . Tubular adenoma  10/07/2013  . Hemorrhoids 10/07/2013  . Seborrheic keratosis 10/07/2013  . Overweight (BMI 25.0-29.9) 10/07/2013  . Healthcare maintenance 10/07/2013  . Allergic rhinitis 08/08/2013  . Degenerative joint disease of shoulder 08/03/2013  . Essential hypertension 08/03/2013  . Sciatica associated with disorder of lumbar spine 08/03/2013  . Aortic stenosis 09/08/2011  . Hyperlipidemia 10/02/2009  . Coronary artery disease of native artery of native heart with stable angina pectoris ([place]) 10/02/2009  . Erectile dysfunction 05/07/2009  . Paroxysmal atrial fibrillation ([place]) 07/14/2006    Past Surgical History:  Procedure Laterality Date  . CARDIAC CATHETERIZATION  2007  . CARDIOVERSION    . COLONOSCOPY    . KNEE ARTHROSCOPY WITH MENISCAL REPAIR Right 01/30/2011  . right finger surgery     as a child  . TONSILLECTOMY    . TOTAL KNEE ARTHROPLASTY Left 07/28/2017   Procedure: LEFT TOTAL KNEE ARTHROPLASTY;  Surgeon: Renette Butters, MD;  Location: [facility];  Service: Orthopedics;  Laterality: Left;  . TOTAL SHOULDER ARTHROPLASTY  05/29/2011   Procedure: TOTAL SHOULDER ARTHROPLASTY;  Surgeon: Metta Clines Supple;  Location: [facility];  Service: Orthopedics;  Laterality: Right;  . TOTAL SHOULDER ARTHROPLASTY Left 06/08/2014   DR SUPPLE  . TOTAL SHOULDER ARTHROPLASTY Left 06/08/2014   Procedure: LEFT TOTAL SHOULDER ARTHROPLASTY;  Surgeon: Marin Shutter, MD;  Location: [facility];  Service: Orthopedics;  Laterality: Left;       Home Medications    Prior to Admission medications  Medication Sig Start Date End Date Taking? Authorizing Provider  aspirin EC 325 MG tablet Take 1 tablet (325 mg total) by mouth daily. For DVT prophylaxis 07/28/17   Prudencio Burly III, PA-C  atorvastatin (LIPITOR) 40 MG tablet Take 1 tablet (40 mg total) by mouth daily. 03/19/18   Oval Linsey, MD  carvedilol (COREG) 12.5 MG tablet Take 1 tablet (12.5 mg total) by mouth 2 (two) times daily with a meal. 03/19/18    Oval Linsey, MD  indomethacin (INDOCIN) 50 MG capsule Take 1 capsule (50 mg total) by mouth 2 (two) times daily with a meal. 11/26/18   Raylene Everts, MD    Family History Family History  Problem Relation Age of Onset  . Coronary artery disease Father 73       Died of myocardial infarction  . Bradycardia Mother 25       Requiring pacemeaker placement  . Coronary artery disease Brother 50       Died of myocardial infarction  . Obesity Daughter   . Healthy Son     Social History Social History   Tobacco Use  . Smoking status: Former Smoker    Types: Cigarettes    Last attempt to quit: 07/14/2010    Years since quitting: 8.3  . Smokeless tobacco: Never Used  . Tobacco comment: quit smoking about 61yrs ago  Substance Use Topics  . Alcohol use: No    Frequency: Never    Comment: no alcohol since 07  . Drug use: No     Allergies   Patient has no known allergies.   Review of Systems Review of Systems  Constitutional: Negative for chills and fever.  HENT: Negative for ear pain and sore throat.   Eyes: Negative for pain and visual disturbance.  Respiratory: Negative for cough and shortness of breath.   Cardiovascular: Negative for chest pain and palpitations.  Gastrointestinal: Negative for abdominal pain and vomiting.  Genitourinary: Negative for dysuria and hematuria.  Musculoskeletal: Positive for arthralgias and gait problem.  Skin: Negative for color change and rash.  Neurological: Negative for seizures and syncope.  All other systems reviewed and are negative.    Physical Exam Triage Vital Signs ED Triage Vitals  Enc Vitals Group     BP 11/26/18 1043 (!) 148/88     Pulse Rate 11/26/18 1043 70     Resp 11/26/18 1043 17     Temp 11/26/18 1043 (!) 97.1 F (36.2 C)     Temp Source 11/26/18 1043 Oral     SpO2 11/26/18 1043 93 %     Weight --      Height --      Head Circumference --      Peak Flow --      Pain Score 11/26/18 1046 5     Pain Loc --       Pain Edu? --      Excl. in [place]? --    No data found.  Updated Vital Signs BP (!) 148/88 (BP Location: Left Arm)   Pulse 70   Temp (!) 97.1 F (36.2 C) (Oral)   Resp 17   SpO2 93%       Physical Exam Constitutional:      General: He is not in acute distress.    Appearance: He is well-developed.  HENT:     Head: Normocephalic and atraumatic.  Eyes:     Conjunctiva/sclera: Conjunctivae normal.     Pupils: Pupils are equal, round,  and reactive to light.  Neck:     Musculoskeletal: Normal range of motion.  Cardiovascular:     Rate and Rhythm: Normal rate.  Pulmonary:     Effort: Pulmonary effort is normal. No respiratory distress.  Abdominal:     General: There is no distension.     Palpations: Abdomen is soft.  Musculoskeletal: Normal range of motion.       Feet:  Skin:    General: Skin is warm and dry.  Neurological:     Mental Status: He is alert.      UC Treatments / Results  Labs (all labs ordered are listed, but only abnormal results are displayed) Labs Reviewed - No data to display  EKG None  Radiology No results found.  Procedures Procedures (including critical care time)  Medications Ordered in UC Medications - No data to display  Initial Impression / Assessment and Plan / UC Course  I have reviewed the triage vital signs and the nursing notes.  Pertinent labs & imaging results that were available during my care of the patient were reviewed by me and considered in my medical decision making (see chart for details).      Final Clinical Impressions(s) / UC Diagnoses   Final diagnoses:  Podagra  Acute gout involving toe of left foot, unspecified cause     Discharge Instructions     THE GOUT MEDICINE COLCHICINE INTERFERES WITH YOUR BLOOD PRESSURE MEDICINE AND CANNOT BE GIVEN SAFELY   I am prescribing indomethacin Take 2 x a day for gout flares You should see improvement in 1-2 days TAKE WITH FOOD Call your PCP for follow up    ED Prescriptions    Medication Sig Dispense Auth. Provider   indomethacin (INDOCIN) 50 MG capsule Take 1 capsule (50 mg total) by mouth 2 (two) times daily with a meal. 14 capsule Raylene Everts, MD     Controlled Substance Prescriptions [place] Controlled Substance Registry consulted? Not Applicable   Raylene Everts, MD 11/26/18 2056

## 2018-11-26 NOTE — Discharge Instructions (Signed)
THE GOUT MEDICINE COLCHICINE INTERFERES WITH YOUR BLOOD PRESSURE MEDICINE AND CANNOT BE GIVEN SAFELY   I am prescribing indomethacin Take 2 x a day for gout flares You should see improvement in 1-2 days TAKE WITH FOOD Call your PCP for follow up

## 2018-11-26 NOTE — ED Triage Notes (Signed)
Pt presents for recurrent  gout flare up in left foot.

## 2018-11-26 NOTE — Telephone Encounter (Signed)
Pt presents to front desk, stating he has gout in his big toe, states he has never had gout before, wants to be seen. He is referred to urg care or offered appt for next week. He states he will go to urg care and maybe try to find a new doctor. He is informed that he may do so and reminded that due to [facility] clinic hours have changed and we are sorry for the inconvenience he also states he can never get anyone on the telephone when he calls. Again he is told [facility] is sorry for his inconvenience. Suggested possibly using virtual since he is a [employer] employee, states his computer is not working. He states he will go to [facility]

## 2018-11-26 NOTE — Telephone Encounter (Signed)
Requesting to speak with a nurse about gout flare up. Please call pt back.

## 2019-01-03 ENCOUNTER — Other Ambulatory Visit: Payer: Self-pay | Admitting: *Deleted

## 2019-01-03 DIAGNOSIS — I25119 Atherosclerotic heart disease of native coronary artery with unspecified angina pectoris: Secondary | ICD-10-CM

## 2019-01-03 DIAGNOSIS — E7800 Pure hypercholesterolemia, unspecified: Secondary | ICD-10-CM

## 2019-01-03 MED ORDER — ATORVASTATIN CALCIUM 40 MG PO TABS: 40.0000 mg | ORAL_TABLET | Freq: Every day | ORAL | 3 refills | Status: DC

## 2019-01-03 MED ORDER — CARVEDILOL 12.5 MG PO TABS: 12.5000 mg | ORAL_TABLET | Freq: Two times a day (BID) | ORAL | 3 refills | Status: DC

## 2019-01-24 ENCOUNTER — Ambulatory Visit (INDEPENDENT_AMBULATORY_CARE_PROVIDER_SITE_OTHER): Payer: Medicare HMO | Admitting: Student in an Organized Health Care Education/Training Program

## 2019-01-24 ENCOUNTER — Other Ambulatory Visit: Payer: Self-pay

## 2019-01-24 ENCOUNTER — Encounter: Payer: Self-pay | Admitting: Student in an Organized Health Care Education/Training Program

## 2019-01-24 VITALS — BP 163/76 | HR 63 | Temp 97.9°F | Ht 66.5 in | Wt 190.9 lb

## 2019-01-24 DIAGNOSIS — M2142 Flat foot [pes planus] (acquired), left foot: Secondary | ICD-10-CM | POA: Insufficient documentation

## 2019-01-24 DIAGNOSIS — R7303 Prediabetes: Secondary | ICD-10-CM | POA: Diagnosis not present

## 2019-01-24 DIAGNOSIS — Z7982 Long term (current) use of aspirin: Secondary | ICD-10-CM

## 2019-01-24 DIAGNOSIS — I351 Nonrheumatic aortic (valve) insufficiency: Secondary | ICD-10-CM | POA: Diagnosis not present

## 2019-01-24 DIAGNOSIS — Z79899 Other long term (current) drug therapy: Secondary | ICD-10-CM

## 2019-01-24 DIAGNOSIS — D692 Other nonthrombocytopenic purpura: Secondary | ICD-10-CM

## 2019-01-24 DIAGNOSIS — I350 Nonrheumatic aortic (valve) stenosis: Secondary | ICD-10-CM | POA: Diagnosis not present

## 2019-01-24 DIAGNOSIS — M1A0720 Idiopathic chronic gout, left ankle and foot, without tophus (tophi): Secondary | ICD-10-CM

## 2019-01-24 DIAGNOSIS — M2032 Hallux varus (acquired), left foot: Secondary | ICD-10-CM

## 2019-01-24 DIAGNOSIS — K429 Umbilical hernia without obstruction or gangrene: Secondary | ICD-10-CM | POA: Diagnosis not present

## 2019-01-24 DIAGNOSIS — I25118 Atherosclerotic heart disease of native coronary artery with other forms of angina pectoris: Secondary | ICD-10-CM

## 2019-01-24 DIAGNOSIS — R011 Cardiac murmur, unspecified: Secondary | ICD-10-CM

## 2019-01-24 DIAGNOSIS — I10 Essential (primary) hypertension: Secondary | ICD-10-CM

## 2019-01-24 DIAGNOSIS — Z6830 Body mass index (BMI) 30.0-30.9, adult: Secondary | ICD-10-CM

## 2019-01-24 DIAGNOSIS — L570 Actinic keratosis: Secondary | ICD-10-CM

## 2019-01-24 DIAGNOSIS — E669 Obesity, unspecified: Secondary | ICD-10-CM

## 2019-01-24 DIAGNOSIS — M109 Gout, unspecified: Secondary | ICD-10-CM | POA: Insufficient documentation

## 2019-01-24 DIAGNOSIS — L821 Other seborrheic keratosis: Secondary | ICD-10-CM | POA: Diagnosis not present

## 2019-01-24 LAB — POCT GLYCOSYLATED HEMOGLOBIN (HGB A1C): Hemoglobin A1C: 5.7 % — AB (ref 4.0–5.6)

## 2019-01-24 LAB — GLUCOSE, CAPILLARY: Glucose-Capillary: 91 mg/dL (ref 70–99)

## 2019-01-24 MED ORDER — ASPIRIN EC 81 MG PO TBEC: 81.0000 mg | DELAYED_RELEASE_TABLET | Freq: Every day | ORAL | 2 refills | Status: DC

## 2019-01-24 NOTE — Assessment & Plan Note (Signed)
Patient with worsening left foot pain probably multifactorial.  He has lost the medial arch of the foot and is now having an inward inversion.  No diabetes or neuropathic disease to suggest Charcot deformity.  He also has a hallux varus which could be causing some pain.  Plan is to refer to podiatry for assessments and likely orthotics.

## 2019-01-24 NOTE — Progress Notes (Signed)
   Assessment and Plan:  See Encounters tab for problem-based medical decision making.   __________________________________________________________  HPI:   73 year old person here for follow-up of hypertension and aortic stenosis.  This is my first time meeting him, he was a patient of Dr. Caroline More and now transferred to my care.  He reports doing very well since being last seen.  Has had no hospitalizations or acute illness.  Has had no cough, no fevers.  He is retired, lives in [place] with his wife, has 2 adult grown children.  He reports having an episode of gout to the left great toe, new for him, treated with NSAIDs through the urgent care.  Currently asymptomatic.  Denies any chest pain or dyspnea on exertion.  Denies dizziness, lightheadedness, or presyncope.  Enjoys a good exertional capacity, independent in all activities of daily living.  Denies any abdominal pain around the site of his umbilical hernia.  __________________________________________________________  Problem List: Patient Active Problem List   Diagnosis Date Noted  . Essential hypertension 08/03/2013    Priority: High  . Coronary artery disease of native artery of native heart with stable angina pectoris ([place]) 10/02/2009    Priority: High  . Prediabetes 01/24/2019    Priority: Medium  . Umbilical hernia 54/02/8118    Priority: Medium  . Aortic stenosis 09/08/2011    Priority: Medium  . Hyperlipidemia 10/02/2009    Priority: Medium  . Flat foot, acquired, left 01/24/2019    Priority: Low  . Gout 01/24/2019    Priority: Low  . Osteoarthritis of both knees 12/12/2014    Priority: Low  . Tubular adenoma 10/07/2013    Priority: Low  . Seborrheic keratosis 10/07/2013    Priority: Low  . Healthcare maintenance 10/07/2013    Priority: Low  . Sciatica associated with disorder of lumbar spine 08/03/2013    Priority: Low    Medications: Reconciled today in  Epic __________________________________________________________  Physical Exam:  Vital Signs: Vitals:   01/24/19 1020  BP: (!) 163/76  Pulse: 63  Temp: 97.9 F (36.6 C)  TempSrc: Oral  SpO2: 98%  Weight: 190 lb 14.4 oz (86.6 kg)  Height: 5' 6.5" (1.689 m)    Gen: Well appearing, NAD Neck: No cervical LAD, No thyromegaly or nodules, No JVD. CV: RRR, 3 out of 6 early systolic murmur best heard at the right upper sternal border Pulm: Normal effort, CTA throughout, no wheezing Abd: Soft, NT, ND, 5 cm umbilical hernia with some erythema and one erosion.  This is soft and easily reproducible, nonpainful. Skin: Diffuse seborrheic keratoses along his back and thighs.  He has diffuse actinic keratoses on his bilateral arms and lower legs.  Also has senile purpura at bilateral forearms.

## 2019-01-24 NOTE — Assessment & Plan Note (Signed)
History of moderate nonobstructing coronary artery disease seen on catheterization in 2007.  This is stable, he is having no significant angina at this time.  Plan is to continue with atorvastatin 40 mg daily, we will check lipids today.  Continue with carvedilol 12.5 mg twice daily.  Decrease aspirin from 325 to 81 mg daily because of worsening senile purpura.

## 2019-01-24 NOTE — Progress Notes (Signed)
5'7 

## 2019-01-24 NOTE — Assessment & Plan Note (Signed)
Hemoglobin A1c is slightly elevated at 5.7%.  This is associated with hypertension and obesity with a BMI of 30.  Plan will be to monitor A1c annually.  Encouraged lifestyle modifications with nutrition and increase exercise.

## 2019-01-24 NOTE — Assessment & Plan Note (Signed)
Mild aortic stenosis with calcification and peak gradient of 10 on echo in 2017.  Also with mild aortic insufficiency.  Symptomatically doing very well with no chest pain, presyncope, or dyspnea with exertion.  Plan is to repeat an echo to monitor.

## 2019-01-24 NOTE — Assessment & Plan Note (Signed)
Chronic umbilical hernia, now with some changes of the skin makes me wonder if there is some mild ischemia to this tissue.  Thankfully he has no pain, it is soft and reducible.  Patient is a non-smoker, BMI of 30.  Will refer to general surgery to consider hernia repair.

## 2019-01-24 NOTE — Patient Instructions (Addendum)
Today we talked about your high blood pressure.  Your pressure was high here in the office.  I want you to continue taking your blood pressure at home.  Continue your regular medicines.  Come back to see me in 3 months but bring your blood pressure cuff with you to that visit so we can check your log and make sure it is accurate.  We talked about your umbilical hernia.  I think it is at the point where we do need to think about repairing it.  I am referring you to a general surgeon to talk about what that would entail.  We talked about your left foot pain.  I think it is due to a combination of the flatfoot and bunion.  I am referring you to [facility] to consider orthotics.  We talked about your gout.  I am going to check the uric acid level in your blood.  Let me know if you have any new gout flares.  We talked about your aortic stenosis.  It is time for another echocardiogram which I have ordered.  This is the ultrasound of the heart.  We will check blood work today to monitor your cholesterol, kidney function, and electrolytes.  I will send you a letter or give you a call with the results.

## 2019-01-24 NOTE — Assessment & Plan Note (Signed)
First episode of podagra few months ago treated with NSAIDs from the urgent care.  Doing well with no other signs of recurrent gout attacks.  He has one subcutaneous nodule over the left Achilles which I have low suspicion is a tophus.  Going to check a serum uric acid today.  As long as he does not have CKD, I do not think we have to initiate allopurinol prophylaxis at this point.

## 2019-01-24 NOTE — Assessment & Plan Note (Signed)
Blood pressure is elevated today which is an outlier.  He reports good blood pressure control at home, checks several times a week.  Plan is to continue with carvedilol 12.5 mg twice daily.  Asked him to bring his blood pressure cuff to his next visit and we can look through his readings as well as confirm that it is accurate against our blood pressure measurements.

## 2019-01-25 ENCOUNTER — Encounter: Payer: Self-pay | Admitting: Student in an Organized Health Care Education/Training Program

## 2019-01-25 LAB — LIPID PANEL
Chol/HDL Ratio: 4.2 ratio (ref 0.0–5.0)
Cholesterol, Total: 131 mg/dL (ref 100–199)
HDL: 31 mg/dL — ABNORMAL LOW (ref >39)
LDL Calculated: 77 mg/dL (ref 0–99)
Triglycerides: 116 mg/dL (ref 0–149)
VLDL Cholesterol Cal: 23 mg/dL (ref 5–40)

## 2019-01-25 LAB — BMP8+ANION GAP
Anion Gap: 18 mmol/L (ref 10.0–18.0)
BUN/Creatinine Ratio: 16 (ref 10–24)
BUN: 18 mg/dL (ref 8–27)
CO2: 19 mmol/L — ABNORMAL LOW (ref 20–29)
Calcium: 9.2 mg/dL (ref 8.6–10.2)
Chloride: 106 mmol/L (ref 96–106)
Creatinine, Ser: 1.13 mg/dL (ref 0.76–1.27)
GFR calc Af Amer: 75 mL/min/{1.73_m2} (ref >59)
GFR calc non Af Amer: 65 mL/min/{1.73_m2} (ref >59)
Glucose: 87 mg/dL (ref 65–99)
Potassium: 4.5 mmol/L (ref 3.5–5.2)
Sodium: 143 mmol/L (ref 134–144)

## 2019-01-25 LAB — URIC ACID: Uric Acid: 8.2 mg/dL (ref 3.7–8.6)

## 2019-01-27 ENCOUNTER — Other Ambulatory Visit: Payer: Self-pay | Admitting: Podiatry

## 2019-01-27 ENCOUNTER — Encounter: Payer: Self-pay | Admitting: Podiatry

## 2019-01-27 ENCOUNTER — Ambulatory Visit (INDEPENDENT_AMBULATORY_CARE_PROVIDER_SITE_OTHER): Payer: Medicare HMO | Admitting: Podiatry

## 2019-01-27 ENCOUNTER — Ambulatory Visit (INDEPENDENT_AMBULATORY_CARE_PROVIDER_SITE_OTHER): Payer: Medicare HMO

## 2019-01-27 ENCOUNTER — Other Ambulatory Visit: Payer: Self-pay

## 2019-01-27 VITALS — BP 150/74 | HR 65 | Temp 97.2°F | Resp 16

## 2019-01-27 DIAGNOSIS — M7752 Other enthesopathy of left foot: Secondary | ICD-10-CM

## 2019-01-27 DIAGNOSIS — M76822 Posterior tibial tendinitis, left leg: Secondary | ICD-10-CM

## 2019-01-27 DIAGNOSIS — M2140 Flat foot [pes planus] (acquired), unspecified foot: Secondary | ICD-10-CM

## 2019-01-27 NOTE — Progress Notes (Signed)
Subjective:  Patient ID: [name], male    DOB: [birth year],  MRN: [id number] HPI Chief Complaint  Patient presents with  . Foot Pain    Medial foot/dorsal midfoot left - flat foot, aching x 6-8 months, he just had surgery on leg 1 1/2 years ago to fix bowleg, never had problem with foot until now, tried OTC insoles  . New Patient (Initial Visit)    73 y.o. male presents with the above complaint.   ROS: Denies fever chills nausea vomiting muscle aches pains calf pain back pain chest pain shortness of breath.    Past Medical History:  Diagnosis Date  . Allergic rhinitis 08/08/2013   takes Loratadine daily   . Aortic stenosis 09/08/2011   With mild aortic regurgitation, mean gradient 13 mmHg   . Cataract    right but immature  . Complication of anesthesia    stopped breathing - during shoulder surgery 2009-2010  . Coronary artery disease 10/02/2009   Cardiac cath (October 2007): 20% left main, 60% mid LAD, 60-70% distal LAD, 30-40% first diagonal, 30% circumflex, 40% OM, 30-40% RCA   . Degenerative joint disease of shoulder 08/03/2013   Bilateral, s/p right total shoulder arthroplasty 05/29/2011 and left shoulder arthroplasty 06/08/2014  . Diastolic dysfunction 20/07/5425   Echo (04/04/2016): Grade I  . Diverticulosis 10/07/2013   Seen on colonoscopy in 2007   . Erectile dysfunction 05/07/2009  . Essential hypertension 08/03/2013   had been on Lisinopril but stopped per MD  . First degree AV block 03/14/2016  . Gastric ulcer 10/07/2013   Seen on EGD 04/10/2006   . Hemorrhoids 10/07/2013  . Hyperlipidemia 10/02/2009  . Paroxysmal atrial fibrillation ([place]) 07/14/2006  . Sciatica associated with disorder of lumbar spine 08/03/2013   Anterolisthesis with right L 4-5 nerve root compression.  Treated with epidural injections and Physical Therapy.   . Seborrheic keratosis 10/07/2013  . Tubular adenoma of colon    Past Surgical History:  Procedure Laterality Date  . CARDIAC CATHETERIZATION   2007  . CARDIOVERSION    . COLONOSCOPY    . KNEE ARTHROSCOPY WITH MENISCAL REPAIR Right 01/30/2011  . right finger surgery     as a child  . TONSILLECTOMY    . TOTAL KNEE ARTHROPLASTY Left 07/28/2017   Procedure: LEFT TOTAL KNEE ARTHROPLASTY;  Surgeon: Renette Butters, MD;  Location: [facility];  Service: Orthopedics;  Laterality: Left;  . TOTAL SHOULDER ARTHROPLASTY  05/29/2011   Procedure: TOTAL SHOULDER ARTHROPLASTY;  Surgeon: Metta Clines Supple;  Location: [facility];  Service: Orthopedics;  Laterality: Right;  . TOTAL SHOULDER ARTHROPLASTY Left 06/08/2014   DR SUPPLE  . TOTAL SHOULDER ARTHROPLASTY Left 06/08/2014   Procedure: LEFT TOTAL SHOULDER ARTHROPLASTY;  Surgeon: Marin Shutter, MD;  Location: [facility];  Service: Orthopedics;  Laterality: Left;    Current Outpatient Medications:  .  aspirin EC 81 MG tablet, Take 1 tablet (81 mg total) by mouth daily., Disp: 150 tablet, Rfl: 2 .  atorvastatin (LIPITOR) 40 MG tablet, Take 1 tablet (40 mg total) by mouth daily., Disp: 90 tablet, Rfl: 3 .  carvedilol (COREG) 12.5 MG tablet, Take 1 tablet (12.5 mg total) by mouth 2 (two) times daily with a meal., Disp: 180 tablet, Rfl: 3  No Known Allergies Review of Systems Objective:   Vitals:   01/27/19 0837  BP: (!) 150/74  Pulse: 65  Resp: 16  Temp: (!) 97.2 F (36.2 C)    General: Well developed, nourished, in no  acute distress, alert and oriented x3   Dermatological: Skin is warm, dry and supple bilateral. Nails x 10 are well maintained; remaining integument appears unremarkable at this time. There are no open sores, no preulcerative lesions, no rash or signs of infection present.  Vascular: Dorsalis Pedis artery and Posterior Tibial artery pedal pulses are 2/4 bilateral with immedate capillary fill time. Pedal hair growth present. No varicosities and no lower extremity edema present bilateral.   Neruologic: Grossly intact via light touch bilateral. Vibratory intact via tuning fork bilateral.  Protective threshold with Semmes Wienstein monofilament intact to all pedal sites bilateral. Patellar and Achilles deep tendon reflexes 2+ bilateral. No Babinski or clonus noted bilateral.   Musculoskeletal: No gross boney pedal deformities bilateral. No pain, crepitus, or limitation noted with foot and ankle range of motion bilateral. Muscular strength 5/5 in all groups tested bilateral.  Gait: Unassisted, Nonantalgic.    Radiographs:  Radiographs taken today do not demonstrate any type of osseous abnormalities acute in nature.  No ankle issues.  Assessment & Plan:   Assessment: Posterior tibial tendinitis and medial ankle impingement with pes planus left.    Plan: Discussed etiology pathology conservative versus surgical therapies.  At this point after sterile Betadine skin prep I injected 20 mg Kenalog 5 mg Marcaine to the posterior tibial tendon and the anterior medial ankle in 1 injection.  We discussed appropriate shoe gear and I will follow-up with him in about 1 month.     Mikhaela Zaugg T. [place], [place]

## 2019-01-31 ENCOUNTER — Other Ambulatory Visit: Payer: Self-pay

## 2019-01-31 ENCOUNTER — Ambulatory Visit (HOSPITAL_COMMUNITY)
Admission: RE | Admit: 2019-01-31 | Discharge: 2019-01-31 | Disposition: A | Discharge status: Home / Self Care | Payer: Medicare HMO | Source: Ambulatory Visit | Attending: Student in an Organized Health Care Education/Training Program | Admitting: Student in an Organized Health Care Education/Training Program

## 2019-01-31 DIAGNOSIS — I350 Nonrheumatic aortic (valve) stenosis: Secondary | ICD-10-CM

## 2019-01-31 DIAGNOSIS — I4891 Unspecified atrial fibrillation: Secondary | ICD-10-CM | POA: Diagnosis not present

## 2019-01-31 DIAGNOSIS — E785 Hyperlipidemia, unspecified: Secondary | ICD-10-CM | POA: Insufficient documentation

## 2019-01-31 DIAGNOSIS — I083 Combined rheumatic disorders of mitral, aortic and tricuspid valves: Secondary | ICD-10-CM | POA: Diagnosis not present

## 2019-01-31 DIAGNOSIS — I10 Essential (primary) hypertension: Secondary | ICD-10-CM | POA: Diagnosis not present

## 2019-01-31 NOTE — Progress Notes (Signed)
Echocardiogram 2D Echocardiogram has been performed.  [name] 01/31/2019, 2:46 PM

## 2019-02-01 ENCOUNTER — Telehealth: Payer: Self-pay | Admitting: Student in an Organized Health Care Education/Training Program

## 2019-02-01 NOTE — Telephone Encounter (Signed)
I tried calling the patient about the result of the echo, no answer, I left a VM for him to return my call. This was done to monitor his aortic valve stenosis, which seems stable. As long as he is not having symptoms of aortic stenosis, we do not need to consider valve repair or surgery at this time.

## 2019-02-21 ENCOUNTER — Telehealth: Payer: Self-pay | Admitting: Student in an Organized Health Care Education/Training Program

## 2019-02-21 NOTE — Telephone Encounter (Signed)
Thanks. I spoke with him over the phone today about echo results.

## 2019-02-21 NOTE — Telephone Encounter (Signed)
Pt returning Dr Evette Doffing call, pt was out of town. Pls contact pt [phone]

## 2019-02-23 ENCOUNTER — Ambulatory Visit: Payer: Self-pay | Admitting: General Surgery

## 2019-02-23 DIAGNOSIS — K429 Umbilical hernia without obstruction or gangrene: Secondary | ICD-10-CM | POA: Diagnosis not present

## 2019-02-23 NOTE — H&P (View-Only) (Signed)
History of Present Illness Ralene Ok MD; 02/23/2019 10:03 AM) The patient is a 73 year old male who presents with an umbilical hernia. Referred by: Dr. Evette Doffing Chief Complaint: Umbilical hernia  Patient is a 73 year old male with a history of hyperlipidemia, CAD, aortic stenosis, hypertension, who comes in with a primary umbilical hernia. He states this is been there for approximately 1 year. Patient states that he has had some pain to the area. He states that approximately 1-2 months ago he did have some bleeding to the area. He states that he's had a scab in the area since that time. Patient denies any signs or symptoms of obstruction.  Patient is had no previous abdominal surgery.  Patient's PCP is Dr. Evette Doffing.    Past Surgical History Jakie Bielak, [place]; 02/23/2019 9:54 AM) Knee Surgery  Bilateral. Shoulder Surgery  Bilateral. Tonsillectomy   Diagnostic Studies History Antinio Burtenshaw, CMA; 02/23/2019 9:54 AM) Colonoscopy  5-10 years ago  Allergies Nyko Altamirano, CMA; 02/23/2019 9:55 AM) No Known Drug Allergies  [02/23/2019]: Allergies Reconciled   Medication History Claud Ishee, CMA; 02/23/2019 9:55 AM) Atorvastatin Calcium (40MG  Tablet, Oral) Active. Carvedilol (12.5MG  Tablet, Oral) Active. Aspirin (325MG  Tablet, Oral) Active. Medications Reconciled  Social History Krista Giancarlo, [place]; 02/23/2019 9:54 AM) Alcohol use  Remotely quit alcohol use. Caffeine use  Carbonated beverages. No drug use  Tobacco use  Former smoker.  Family History Coyt Arnn, [place]; 02/23/2019 9:54 AM) Alcohol Abuse  Father. Heart Disease  Brother, Father. Hypertension  Father.  Other Problems Azzam Elsaesser, CMA; 02/23/2019 9:54 AM) Heart murmur     Review of Systems Ralene Ok MD; 02/23/2019 10:00 AM) General Not Present- Appetite Loss, Chills, Fatigue, Fever, Night Sweats, Weight Gain and Weight Loss. Skin Not Present- Change in Wart/Mole,  Dryness, Hives, Jaundice, New Lesions, Non-Healing Wounds, Rash and Ulcer. HEENT Not Present- Earache, Hearing Loss, Hoarseness, Nose Bleed, Oral Ulcers, Ringing in the Ears, Seasonal Allergies, Sinus Pain, Sore Throat, Visual Disturbances, Wears glasses/contact lenses and Yellow Eyes. Breast Not Present- Breast Mass, Breast Pain, Nipple Discharge and Skin Changes. Cardiovascular Not Present- Chest Pain, Difficulty Breathing Lying Down, Leg Cramps, Palpitations, Rapid Heart Rate, Shortness of Breath and Swelling of Extremities. Gastrointestinal Not Present- Abdominal Pain, Bloating, Bloody Stool, Change in Bowel Habits, Chronic diarrhea, Constipation, Difficulty Swallowing, Excessive gas, Gets full quickly at meals, Hemorrhoids, Indigestion, Nausea, Rectal Pain and Vomiting. Male Genitourinary Not Present- Blood in Urine, Change in Urinary Stream, Frequency, Impotence, Nocturia, Painful Urination, Urgency and Urine Leakage. Musculoskeletal Not Present- Back Pain, Joint Pain, Joint Stiffness, Muscle Pain, Muscle Weakness and Swelling of Extremities. Neurological Not Present- Decreased Memory, Fainting, Headaches, Numbness, Seizures, Tingling, Tremor, Trouble walking and Weakness. Psychiatric Not Present- Anxiety, Bipolar, Change in Sleep Pattern, Depression, Fearful and Frequent crying. Endocrine Not Present- Cold Intolerance, Excessive Hunger, Hair Changes, Heat Intolerance, Hot flashes and New Diabetes. Hematology Not Present- Blood Thinners, Easy Bruising, Excessive bleeding, Gland problems, HIV and Persistent Infections. All other systems negative  Vitals Howard Bruer CMA; 02/23/2019 9:54 AM) 02/23/2019 9:54 AM Weight: 190.8 lb Height: 67in Body Surface Area: 1.98 m Body Mass Index: 29.88 kg/m  Temp.: 97.49F  Pulse: 79 (Regular)  BP: 166/86(Sitting, Left Arm, Standard)       Physical Exam Ralene Ok MD; 02/23/2019 10:04 AM) The physical exam findings are as  follows: Note: Constitutional: No acute distress, conversant, appears stated age  Eyes: Anicteric sclerae, moist conjunctiva, no lid lag  Neck: No thyromegaly, trachea midline, no cervical lymphadenopathy  Lungs: Clear  to auscultation biilaterally, normal respiratory effot  Cardiovascular: regular rate & rhythm, no murmurs, no peripheal edema, pedal pulses 2+  GI: Soft, no masses or hepatosplenomegaly, non-tender to palpation  MSK: Normal gait, no clubbing cyanosis, edema  Skin: No rashes, palpation reveals normal skin turgor  Psychiatric: Appropriate judgment and insight, oriented to person, place, and time  Abdomen Inspection Hernias - Umbilical hernia - Reducible (Incarcerated, 2 cm umbilical hernia, with skin changes, scab present) .    Assessment & Plan Ralene Ok MD; [id number] AM)  UMBILICAL HERNIA WITHOUT OBSTRUCTION AND WITHOUT GANGRENE (K42.9) Impression: Patient is a 73 year old male with a history of hypertension, hyperlipidemia, CAD, aortic stenosis, with a one-year history of an umbilical hernia. Patient states that the area has been incarcerated for some time. He's had continued, the area. There is scab present. There was some bleeding to this area about a month or so ago.  We'll obtain medical clearance prior to scheduling surgery. 1. The patient will like to proceed to the operating room for laparoscopic umbilical hernia repair with mesh.  2. I discussed with the patient the signs and symptoms of incarceration and strangulation and the need to proceed to the ER should they occur.  3. I discussed with the patient the risks and benefits of the procedure to include but not limited to: Infection, bleeding, damage to surrounding structures, possible need for further surgery, possible nerve pain, and possible recurrence. The patient was understanding and wishes to proceed.

## 2019-02-23 NOTE — H&P (Signed)
History of Present Illness Ralene Ok MD; 02/23/2019 10:03 AM) The patient is a 73 year old male who presents with an umbilical hernia. Referred by: Dr. Evette Doffing Chief Complaint: Umbilical hernia  Patient is a 73 year old male with a history of hyperlipidemia, CAD, aortic stenosis, hypertension, who comes in with a primary umbilical hernia. He states this is been there for approximately 1 year. Patient states that he has had some pain to the area. He states that approximately 1-2 months ago he did have some bleeding to the area. He states that he's had a scab in the area since that time. Patient denies any signs or symptoms of obstruction.  Patient is had no previous abdominal surgery.  Patient's PCP is Dr. Evette Doffing.    Past Surgical History Akhir Cowin, [place]; 02/23/2019 9:54 AM) Knee Surgery  Bilateral. Shoulder Surgery  Bilateral. Tonsillectomy   Diagnostic Studies History Tavius Postlewait, CMA; 02/23/2019 9:54 AM) Colonoscopy  5-10 years ago  Allergies Jevontae Delsordo, CMA; 02/23/2019 9:55 AM) No Known Drug Allergies  [02/23/2019]: Allergies Reconciled   Medication History Chevas Colyer, CMA; 02/23/2019 9:55 AM) Atorvastatin Calcium (40MG  Tablet, Oral) Active. Carvedilol (12.5MG  Tablet, Oral) Active. Aspirin (325MG  Tablet, Oral) Active. Medications Reconciled  Social History Kaelan Mandala, [place]; 02/23/2019 9:54 AM) Alcohol use  Remotely quit alcohol use. Caffeine use  Carbonated beverages. No drug use  Tobacco use  Former smoker.  Family History Zacharius Tam, [place]; 02/23/2019 9:54 AM) Alcohol Abuse  Father. Heart Disease  Brother, Father. Hypertension  Father.  Other Problems Cutler Patriarca, CMA; 02/23/2019 9:54 AM) Heart murmur     Review of Systems Ralene Ok MD; 02/23/2019 10:00 AM) General Not Present- Appetite Loss, Chills, Fatigue, Fever, Night Sweats, Weight Gain and Weight Loss. Skin Not Present- Change in Wart/Mole,  Dryness, Hives, Jaundice, New Lesions, Non-Healing Wounds, Rash and Ulcer. HEENT Not Present- Earache, Hearing Loss, Hoarseness, Nose Bleed, Oral Ulcers, Ringing in the Ears, Seasonal Allergies, Sinus Pain, Sore Throat, Visual Disturbances, Wears glasses/contact lenses and Yellow Eyes. Breast Not Present- Breast Mass, Breast Pain, Nipple Discharge and Skin Changes. Cardiovascular Not Present- Chest Pain, Difficulty Breathing Lying Down, Leg Cramps, Palpitations, Rapid Heart Rate, Shortness of Breath and Swelling of Extremities. Gastrointestinal Not Present- Abdominal Pain, Bloating, Bloody Stool, Change in Bowel Habits, Chronic diarrhea, Constipation, Difficulty Swallowing, Excessive gas, Gets full quickly at meals, Hemorrhoids, Indigestion, Nausea, Rectal Pain and Vomiting. Male Genitourinary Not Present- Blood in Urine, Change in Urinary Stream, Frequency, Impotence, Nocturia, Painful Urination, Urgency and Urine Leakage. Musculoskeletal Not Present- Back Pain, Joint Pain, Joint Stiffness, Muscle Pain, Muscle Weakness and Swelling of Extremities. Neurological Not Present- Decreased Memory, Fainting, Headaches, Numbness, Seizures, Tingling, Tremor, Trouble walking and Weakness. Psychiatric Not Present- Anxiety, Bipolar, Change in Sleep Pattern, Depression, Fearful and Frequent crying. Endocrine Not Present- Cold Intolerance, Excessive Hunger, Hair Changes, Heat Intolerance, Hot flashes and New Diabetes. Hematology Not Present- Blood Thinners, Easy Bruising, Excessive bleeding, Gland problems, HIV and Persistent Infections. All other systems negative  Vitals Lonnell Bea CMA; 02/23/2019 9:54 AM) 02/23/2019 9:54 AM Weight: 190.8 lb Height: 67in Body Surface Area: 1.98 m Body Mass Index: 29.88 kg/m  Temp.: 97.48F  Pulse: 79 (Regular)  BP: 166/86(Sitting, Left Arm, Standard)       Physical Exam Ralene Ok MD; 02/23/2019 10:04 AM) The physical exam findings are as  follows: Note: Constitutional: No acute distress, conversant, appears stated age  Eyes: Anicteric sclerae, moist conjunctiva, no lid lag  Neck: No thyromegaly, trachea midline, no cervical lymphadenopathy  Lungs: Clear  to auscultation biilaterally, normal respiratory effot  Cardiovascular: regular rate & rhythm, no murmurs, no peripheal edema, pedal pulses 2+  GI: Soft, no masses or hepatosplenomegaly, non-tender to palpation  MSK: Normal gait, no clubbing cyanosis, edema  Skin: No rashes, palpation reveals normal skin turgor  Psychiatric: Appropriate judgment and insight, oriented to person, place, and time  Abdomen Inspection Hernias - Umbilical hernia - Reducible (Incarcerated, 2 cm umbilical hernia, with skin changes, scab present) .    Assessment & Plan Ralene Ok MD; [id number] AM)  UMBILICAL HERNIA WITHOUT OBSTRUCTION AND WITHOUT GANGRENE (K42.9) Impression: Patient is a 73 year old male with a history of hypertension, hyperlipidemia, CAD, aortic stenosis, with a one-year history of an umbilical hernia. Patient states that the area has been incarcerated for some time. He's had continued, the area. There is scab present. There was some bleeding to this area about a month or so ago.  We'll obtain medical clearance prior to scheduling surgery. 1. The patient will like to proceed to the operating room for laparoscopic umbilical hernia repair with mesh.  2. I discussed with the patient the signs and symptoms of incarceration and strangulation and the need to proceed to the ER should they occur.  3. I discussed with the patient the risks and benefits of the procedure to include but not limited to: Infection, bleeding, damage to surrounding structures, possible need for further surgery, possible nerve pain, and possible recurrence. The patient was understanding and wishes to proceed.

## 2019-02-24 ENCOUNTER — Other Ambulatory Visit: Payer: Self-pay

## 2019-02-24 ENCOUNTER — Encounter: Payer: Self-pay | Admitting: Podiatry

## 2019-02-24 ENCOUNTER — Ambulatory Visit (INDEPENDENT_AMBULATORY_CARE_PROVIDER_SITE_OTHER): Payer: Medicare HMO | Admitting: Podiatry

## 2019-02-24 DIAGNOSIS — M109 Gout, unspecified: Secondary | ICD-10-CM

## 2019-02-24 DIAGNOSIS — M7752 Other enthesopathy of left foot: Secondary | ICD-10-CM | POA: Diagnosis not present

## 2019-02-24 DIAGNOSIS — M76822 Posterior tibial tendinitis, left leg: Secondary | ICD-10-CM

## 2019-02-24 MED ORDER — INDOMETHACIN 50 MG PO CAPS: 50.0000 mg | ORAL_CAPSULE | Freq: Two times a day (BID) | ORAL | 1 refills | Status: DC

## 2019-02-26 ENCOUNTER — Encounter: Payer: Self-pay | Admitting: Podiatry

## 2019-02-26 NOTE — Progress Notes (Signed)
He presents today for follow-up of his posterior tibial tendinitis pes planus and medial ankle impingement left states that it is better but is hurting on the outside now rather than the inside.  He has a history of gout and started on his indomethacin that he had at home.  ROS: Denies fever chills nausea vomiting muscle aches pains calf pain back pain chest pain shortness of breath.  Objective: Vital signs are stable he is alert and oriented x3 he has an area of erythema and edema overlying the fourth fifth TMT no medial calcaneal pain no signs of infection exquisitely tender on palpation.  Pulses are strongly palpable.  Neurologic sensorium is intact.  Deep tendon reflexes are intact.  Muscle strength is normal and symmetrical.  No open lesions or wounds are noted.  Radiographs were not taken today.  Assessment probable gouty capsulitis fourth fifth TMT left foot.  Plan: Discussed etiology pathology conservative versus surgical therapies.  At this point I injected 20 mg Kenalog 5 mg of Marcaine at the point of maximal tenderness after sterile Betadine skin prep.  He tolerated this procedure well.  I also refilled his indomethacin 50 mg 1 twice daily.  Should this patient call requesting blood work for gout we are to prepare a requisition for him for an arthritic profile including uric acid and a CBC.  He is to have an appointment made immediately following his blood draw.

## 2019-02-28 ENCOUNTER — Encounter: Payer: Self-pay | Admitting: Student in an Organized Health Care Education/Training Program

## 2019-03-02 NOTE — Pre-Procedure Instructions (Signed)
[place], [place] [address] Phone: [phone] Fax: [phone]  [facility]), [place] - [id number]  [place]) [place] [address] Phone: [phone] Fax: [phone]      Your procedure is scheduled on Friday, September 11th .  Report to [facility] Main Entrance "A" at 9:15 A.M., and check in at the Admitting office.  Call this number if you have problems the morning of surgery:  [phone]  Call [phone] if you have any questions prior to your surgery date Monday-Friday 8am-4pm    Remember:  Do not eat or drink after midnight the night before your surgery    Take these medicines the morning of surgery with A SIP OF WATER  carvedilol (COREG)  Follow your surgeon's instructions on when to stop Aspirin.  If no instructions were given by your surgeon then you will need to call the office to get those instructions.    7 days prior to surgery STOP taking any Aspirin (unless otherwise instructed by your surgeon), Aleve, Naproxen, Ibuprofen, Motrin, Advil, Goody's, BC's, all herbal medications, fish oil, and all vitamins. Including: indomethacin (INDOCIN)    The Morning of Surgery  Do not wear jewelry.  Do not wear lotions, powders, or colognes, or deodorant  Men may shave face and neck.  Do not bring valuables to the hospital.  [facility] is not responsible for any belongings or valuables.  If you are a smoker, DO NOT Smoke 24 hours prior to surgery IF you wear a CPAP at night please bring your mask, tubing, and machine the morning of surgery   Remember that you must have someone to transport you home after your surgery, and remain with you for 24 hours if you are discharged the same day.   Contacts, glasses, hearing aids, dentures or bridgework may not be worn into surgery.    Leave your suitcase in the car.  After surgery it may be  brought to your room.  For patients admitted to the hospital, discharge time will be determined by your treatment team.  Patients discharged the day of surgery will not be allowed to drive home.    Special instructions:   [facility]- Preparing For Surgery  Before surgery, you can play an important role. Because skin is not sterile, your skin needs to be as free of germs as possible. You can reduce the number of germs on your skin by washing with CHG (chlorahexidine gluconate) Soap before surgery.  CHG is an antiseptic cleaner which kills germs and bonds with the skin to continue killing germs even after washing.    Oral Hygiene is also important to reduce your risk of infection.  Remember - BRUSH YOUR TEETH THE MORNING OF SURGERY WITH YOUR REGULAR TOOTHPASTE  Please do not use if you have an allergy to CHG or antibacterial soaps. If your skin becomes reddened/irritated stop using the CHG.  Do not shave (including legs and underarms) for at least 48 hours prior to first CHG shower. It is OK to shave your face.  Please follow these instructions carefully.   1. Shower the NIGHT BEFORE SURGERY and the MORNING OF SURGERY with CHG Soap.   2. If you chose to wash your hair, wash your hair first as usual with your normal shampoo.  3. After you shampoo, rinse your hair and body thoroughly to remove the shampoo.  4. Use  CHG as you would any other liquid soap. You can apply CHG directly to the skin and wash gently with a scrungie or a clean washcloth.   5. Apply the CHG Soap to your body ONLY FROM THE NECK DOWN.  Do not use on open wounds or open sores. Avoid contact with your eyes, ears, mouth and genitals (private parts). Wash Face and genitals (private parts)  with your normal soap.   6. Wash thoroughly, paying special attention to the area where your surgery will be performed.  7. Thoroughly rinse your body with warm water from the neck down.  8. DO NOT shower/wash with your normal soap  after using and rinsing off the CHG Soap.  9. Pat yourself dry with a CLEAN TOWEL.  10. Wear CLEAN PAJAMAS to bed the night before surgery, wear comfortable clothes the morning of surgery  11. Place CLEAN SHEETS on your bed the night of your first shower and DO NOT SLEEP WITH PETS.    Day of Surgery:  Do not apply any deodorants/lotions. Please shower the morning of surgery with the CHG soap  Please wear clean clothes to the hospital/surgery center.   Remember to brush your teeth WITH YOUR REGULAR TOOTHPASTE.   Please read over the following fact sheets that you were given.

## 2019-03-03 ENCOUNTER — Encounter (HOSPITAL_COMMUNITY): Payer: Self-pay

## 2019-03-03 ENCOUNTER — Other Ambulatory Visit: Payer: Self-pay

## 2019-03-03 ENCOUNTER — Encounter (HOSPITAL_COMMUNITY)
Admission: RE | Admit: 2019-03-03 | Discharge: 2019-03-03 | Disposition: A | Discharge status: Home / Self Care | Payer: Medicare HMO | Source: Ambulatory Visit | Attending: General Surgery | Admitting: General Surgery

## 2019-03-03 DIAGNOSIS — Z791 Long term (current) use of non-steroidal anti-inflammatories (NSAID): Secondary | ICD-10-CM | POA: Diagnosis not present

## 2019-03-03 DIAGNOSIS — K429 Umbilical hernia without obstruction or gangrene: Secondary | ICD-10-CM | POA: Insufficient documentation

## 2019-03-03 DIAGNOSIS — I5030 Unspecified diastolic (congestive) heart failure: Secondary | ICD-10-CM | POA: Diagnosis not present

## 2019-03-03 DIAGNOSIS — Z96612 Presence of left artificial shoulder joint: Secondary | ICD-10-CM | POA: Insufficient documentation

## 2019-03-03 DIAGNOSIS — E785 Hyperlipidemia, unspecified: Secondary | ICD-10-CM | POA: Diagnosis not present

## 2019-03-03 DIAGNOSIS — Z96652 Presence of left artificial knee joint: Secondary | ICD-10-CM | POA: Insufficient documentation

## 2019-03-03 DIAGNOSIS — R9431 Abnormal electrocardiogram [ECG] [EKG]: Secondary | ICD-10-CM | POA: Diagnosis not present

## 2019-03-03 DIAGNOSIS — Z01818 Encounter for other preprocedural examination: Secondary | ICD-10-CM | POA: Diagnosis not present

## 2019-03-03 DIAGNOSIS — I2510 Atherosclerotic heart disease of native coronary artery without angina pectoris: Secondary | ICD-10-CM | POA: Diagnosis not present

## 2019-03-03 DIAGNOSIS — I350 Nonrheumatic aortic (valve) stenosis: Secondary | ICD-10-CM | POA: Insufficient documentation

## 2019-03-03 DIAGNOSIS — Z7982 Long term (current) use of aspirin: Secondary | ICD-10-CM | POA: Diagnosis not present

## 2019-03-03 DIAGNOSIS — I110 Hypertensive heart disease with heart failure: Secondary | ICD-10-CM | POA: Diagnosis not present

## 2019-03-03 DIAGNOSIS — H269 Unspecified cataract: Secondary | ICD-10-CM | POA: Insufficient documentation

## 2019-03-03 DIAGNOSIS — Z79899 Other long term (current) drug therapy: Secondary | ICD-10-CM | POA: Insufficient documentation

## 2019-03-03 DIAGNOSIS — I480 Paroxysmal atrial fibrillation: Secondary | ICD-10-CM | POA: Diagnosis not present

## 2019-03-03 LAB — BASIC METABOLIC PANEL
Anion gap: 10 (ref 5–15)
BUN: 16 mg/dL (ref 8–23)
CO2: 22 mmol/L (ref 22–32)
Calcium: 8.9 mg/dL (ref 8.9–10.3)
Chloride: 108 mmol/L (ref 98–111)
Creatinine, Ser: 1.09 mg/dL (ref 0.61–1.24)
GFR calc Af Amer: >60 mL/min (ref >60)
GFR calc non Af Amer: >60 mL/min (ref >60)
Glucose, Bld: 116 mg/dL — ABNORMAL HIGH (ref 70–99)
Potassium: 3.9 mmol/L (ref 3.5–5.1)
Sodium: 140 mmol/L (ref 135–145)

## 2019-03-03 LAB — CBC
HCT: 44.2 % (ref 39.0–52.0)
Hemoglobin: 14.6 g/dL (ref 13.0–17.0)
MCH: 31.5 pg (ref 26.0–34.0)
MCHC: 33 g/dL (ref 30.0–36.0)
MCV: 95.3 fL (ref 80.0–100.0)
Platelets: 173 10*3/uL (ref 150–400)
RBC: 4.64 MIL/uL (ref 4.22–5.81)
RDW: 13.6 % (ref 11.5–15.5)
WBC: 9.3 10*3/uL (ref 4.0–10.5)
nRBC: 0 % (ref 0.0–0.2)

## 2019-03-03 NOTE — Progress Notes (Signed)
PCP - Dr. Evette Doffing  Cardiologist - Denies  Chest x-ray - Denies  EKG - 03/03/2019  Stress Test - Denies  ECHO - 02/27/2019 (E)  Cardiac Cath - 2007 (E)  AICD-na PM-na LOOP-na  Sleep Study - Denies CPAP - None  LABS- 03/03/2019: CBC, BMP 03/08/2019: COVID  ASA- LD-9/10  ERAS- No   Anesthesia- Yes- cardiac hsitory  Pt denies having chest pain, sob, or fever at this time. All instructions explained to the pt, with a verbal understanding of the material. Pt agrees to go over the instructions while at home for a better understanding. Pt also instructed to self quarantine after being tested for COVID-19. The opportunity to ask questions was provided.   Coronavirus Screening  Have you experienced the following symptoms:  Cough yes/no: No Fever (>100.15F)  yes/no: No Runny nose yes/no: No Sore throat yes/no: No Difficulty breathing/shortness of breath  yes/no: No  Have you or a family member traveled in the last 14 days and where? yes/no: No   If the patient indicates "YES" to the above questions, their PAT will be rescheduled to limit the exposure to others and, the surgeon will be notified. THE PATIENT WILL NEED TO BE ASYMPTOMATIC FOR 14 DAYS.   If the patient is not experiencing any of these symptoms, the PAT nurse will instruct them to NOT bring anyone with them to their appointment since they may have these symptoms or traveled as well.   Please remind your patients and families that hospital visitation restrictions are in effect and the importance of the restrictions.

## 2019-03-04 ENCOUNTER — Telehealth: Payer: Self-pay | Admitting: Student in an Organized Health Care Education/Training Program

## 2019-03-04 ENCOUNTER — Encounter: Payer: Self-pay | Admitting: Student in an Organized Health Care Education/Training Program

## 2019-03-04 DIAGNOSIS — I4891 Unspecified atrial fibrillation: Secondary | ICD-10-CM | POA: Insufficient documentation

## 2019-03-04 DIAGNOSIS — I4819 Other persistent atrial fibrillation: Secondary | ICD-10-CM | POA: Insufficient documentation

## 2019-03-04 MED ORDER — RIVAROXABAN 20 MG PO TABS: 20.0000 mg | ORAL_TABLET | Freq: Every day | ORAL | 0 refills | Status: DC

## 2019-03-04 MED ORDER — RIVAROXABAN 20 MG PO TABS: 20.0000 mg | ORAL_TABLET | Freq: Every day | ORAL | 3 refills | Status: DC

## 2019-03-04 NOTE — Telephone Encounter (Signed)
Got a call from pre-op anesthesia clinic that [name] had A fib on a routine ECG. I called him to discuss. He is feeling fine, no chest pain, dizziness or DOE. Still good exertional capacity. He has a recent echo with moderate LA dilation. He has moderate AS which is asymptomatic, I doubt this is valvular a fib. I looked at the ECG, the V1 lead looks to have a flutter wave with variable conduction. He had an episode of a fib about 10 years ago, but attributed to an acute illness at the time and has not been anticoagulated. His CHADS-Vasc score is 3 for age, HTN, and CAD. Plan is to proceed with the hernia repair surgery as planned on 9/11, he is well compensated and rate controlled. I am going to prescribe Xarelto 20mg  daily for primary stroke prophylaxis, gave instructions to hold medicine on 9/10 and 9/11 for the surgery, then resume on 9/12. I will follow up with him in the clinic after the surgery to discuss more.

## 2019-03-04 NOTE — Anesthesia Preprocedure Evaluation (Addendum)
Anesthesia Evaluation  Patient identified by MRN, date of birth, ID band Patient awake    Reviewed: Allergy & Precautions, NPO status , Patient's Chart, lab work & pertinent test results  Airway Mallampati: II  TM Distance: >3 FB Neck ROM: Full    Dental no notable dental hx. (+) Edentulous Upper, Edentulous Lower   Pulmonary former smoker,    Pulmonary exam normal breath sounds clear to auscultation       Cardiovascular hypertension, + CAD and + DOE  Normal cardiovascular exam+ dysrhythmias Atrial Fibrillation + Valvular Problems/Murmurs AS and AI  Rhythm:Irregular Rate:Normal  Relatively new Afib, started on xarelto recently for stroke prevention and is rate controlled with BB  Last echo 01/2019:   1. The left ventricle has normal systolic function with an ejection fraction of 60-65%. The cavity size was normal. There is mildly increased left ventricular wall thickness. Left ventricular diastolic function could not be evaluated secondary to atrial  fibrillation.  2. The right ventricle has normal systolic function. The cavity was normal.  3. Left atrial size was moderately dilated.  4. The mitral valve is abnormal. There is mild mitral annular calcification present.  5. The aorta is normal in size and structure.  6. Normal LV systolic function; mild LVH; calcified aortic valve with moderate AS (mean gradient 22 mmHg) and mild AI; moderate LAE; mild MR.    Neuro/Psych negative neurological ROS  negative psych ROS   GI/Hepatic PUD, History of alcohol abuse Gastric ulcer   Endo/Other    Renal/GU      Musculoskeletal  (+) Arthritis , Osteoarthritis,  Sciatica, L4-5 nerve root compression   Abdominal   Peds  Hematology   Anesthesia Other Findings   Reproductive/Obstetrics                                                            Anesthesia Evaluation  Patient identified by MRN, date of  birth, ID band Patient awake    Reviewed: Allergy & Precautions, H&P , NPO status , Patient's Chart, lab work & pertinent test results, reviewed documented beta blocker date and time   Airway Mallampati: II  TM Distance: >3 FB Neck ROM: Full    Dental no notable dental hx. (+) Edentulous Upper, Edentulous Lower, Dental Advisory Given   Pulmonary neg pulmonary ROS, former smoker,    Pulmonary exam normal breath sounds clear to auscultation       Cardiovascular hypertension, Pt. on medications and Pt. on home beta blockers + CAD  + dysrhythmias Atrial Fibrillation + Valvular Problems/Murmurs AS  Rhythm:Regular Rate:Normal + Systolic murmurs    Neuro/Psych negative neurological ROS  negative psych ROS   GI/Hepatic negative GI ROS, Neg liver ROS,   Endo/Other  negative endocrine ROS  Renal/GU negative Renal ROS  negative genitourinary   Musculoskeletal  (+) Arthritis , Osteoarthritis,    Abdominal   Peds  Hematology negative hematology ROS (+)   Anesthesia Other Findings   Reproductive/Obstetrics negative OB ROS                            Anesthesia Physical Anesthesia Plan  ASA: III  Anesthesia Plan: General   Post-op Pain Management:  Regional for Post-op pain   Induction: Intravenous  PONV  Risk Score and Plan: 3 and Ondansetron, Dexamethasone and Midazolam  Airway Management Planned: LMA  Additional Equipment:   Intra-op Plan:   Post-operative Plan: Extubation in OR  Informed Consent: I have reviewed the patients History and Physical, chart, labs and discussed the procedure including the risks, benefits and alternatives for the proposed anesthesia with the patient or authorized representative who has indicated his/her understanding and acceptance.   Dental advisory given  Plan Discussed with: CRNA  Anesthesia Plan Comments:         Anesthesia Quick Evaluation  Anesthesia Physical Anesthesia  Plan  ASA: III  Anesthesia Plan: General   Post-op Pain Management:    Induction: Intravenous  PONV Risk Score and Plan: 2 and Ondansetron and Dexamethasone  Airway Management Planned: Oral ETT  Additional Equipment: None  Intra-op Plan:   Post-operative Plan: Extubation in OR  Informed Consent: I have reviewed the patients History and Physical, chart, labs and discussed the procedure including the risks, benefits and alternatives for the proposed anesthesia with the patient or authorized representative who has indicated his/her understanding and acceptance.     Dental advisory given  Plan Discussed with:   Anesthesia Plan Comments:        Anesthesia Quick Evaluation

## 2019-03-04 NOTE — Progress Notes (Signed)
Anesthesia Chart Review:  Case: [id number] Date/Time: 03/11/19 1100   Procedure: UMBILICAL HERNIA REPAIR WITH MESH (N/A )   Anesthesia type: General   Pre-op diagnosis: UMBILICAL HERNIA   Location: [facility] OR ROOM 02 / [facility]   Surgeon: Ralene Ok, MD      DISCUSSION: Patient is a 73 year old male scheduled for the above procedure.  History includes CAD (moderate 2007), aortic stenosis (moderate 01/2019), PAF (in setting of non-ischemic cardiomyopathy/ETOH 2007; recurrent afib 03/03/19 EKG), HTN, HLD, diastolic dysfunction, first degree AV block, former smoker (quit 2012). Sober since 2007. Left TKA 07/28/17.  Anesthesia APP not asked to evaluate in PAT, but preoperative EKG showed afib. He has not had known recurrent afib since ~ 2007, but did have bigeminy PACs on his 05/08/17 EKG. I called and spoke with him. He reports recent treatment for gout flare with indomethacin and Kenolog injections on 01/27/19 and 02/24/19. His last dose of ASA and indomethacin were on 03/03/19. He can not tell that he is back in afib. He remains sober. His ASA was recently cut from 325 mg to 81 mg due to bruising, but ASA now on hold for surgery. He is minimally more SOB over the past week with weight up 5 lbs which he attributed to indomethacin. He does feel that his hand/feet appear swollen. He denied chest pain. No conversational dyspnea. He was able to go shopping with his wife today and did not not any significant SOB. He was able to mow his grass and do hedge trimming last weekend without any chest pain--some SOB, but not felt significant and he attributed to his allergies.   He has not had to seek urgent medical attention due to his hernia. He seemed aware of potential concerns with afib given his prior history, and we reviewed symptoms that would suggest need for further immediate evaluation. I also discussed with anesthesiologist Adele Barthel, MD and will defer further recommendations to his PCP since this is who is  currently managing his cardiac issues. I am attempting to contact Dr. Evette Doffing for additional recommendations, but told [name] that at this point it is still unclear if his recurrent afib may delay surgery plans.  He says he is okay with postponing surgery if his doctors feel it is necessary.  He just had an echo a few weeks ago--no mention of afib at that time. Last ASA 9/3//20 PM--recently cut to 81 mg about a week ago due to bruising.    Preoperative COVID-19 test is scheduled for 03/08/19.   ADDENDUM 03/04/19 2:33 PM:  I spoke with Dr. Evette Doffing who also called and spoke with Mr. Juett. See his note in [facility] that states, ".Marland KitchenMarland KitchenHis CHADS-Vasc score is 3 for age, HTN, and CAD. Plan is to proceed with the hernia repair surgery as planned on 9/11, he is well compensated and rate controlled. I am going to prescribe Xarelto 20mg  daily for primary stroke prophylaxis, gave instructions to hold medicine on 9/10 and 9/11 for the surgery, then resume on 9/12. I will follow up with him in the clinic after the surgery to discuss more." I have routed Dr. Autumn Patty notation to Dr. Lady Gary and also spoke with [facility] triage nurse Abigail Butts.    Based on currently available information, I would anticipate that he can proceed as planned if COVID test negative, afib remains controlled without CV symptoms. Since starting on Xarelto, he would meet criteria for preoperative PT/INR unless deferred by assigned anesthesiologist. Anesthesia team to evaluate on arrival.  VS: BP (!) 172/75   Pulse 70   Temp 36.5 C   Resp 20   Ht 5\' 6"  (1.676 m)   Wt 85.6 kg   SpO2 95%   BMI 30.47 kg/m    BP Readings from Last 3 Encounters:  03/03/19 (!) 172/75  01/27/19 (!) 150/74  01/24/19 (!) 163/76  At home he reports SBP ~ 130 (wrist cuff).   PROVIDERS: Axel Filler, MD is PCP - He is not currently followed by cardiology. He used to see cardiologist Kirk Ruths, MD ~ [phone] for afib in the setting of  cardiomyopathy/ETOH that recovered with no recurrence after discontinuation of ETOH. Last office visit 08/23/13. Cath in 2007 showed mild-moderate CAD. More recently, cardiac issues have been followed by primary care with CAD and AS being specifically addressed at his 01/24/19 visit with Dr. Evette Doffing. Follow-up echo 01/2019 showed normal LVEF with some progression in his AS to moderate, but patient asymptomatic.      LABS: Labs reviewed: Acceptable for surgery. (all labs ordered are listed, but only abnormal results are displayed)  Labs Reviewed  BASIC METABOLIC PANEL - Abnormal; Notable for the following components:      Result Value   Glucose, Bld 116 (*)    All other components within normal limits  CBC      EKG: 03/03/19: Afib at 62 bpm. Non-specific T wave abnormality. Afib is new when compared to 05/08/17 tracing.   CV: Echo 01/31/19: IMPRESSIONS  1. The left ventricle has normal systolic function with an ejection fraction of 60-65%. The cavity size was normal. There is mildly increased left ventricular wall thickness. Left ventricular diastolic function could not be evaluated secondary to atrial  fibrillation.  2. The right ventricle has normal systolic function. The cavity was normal.  3. Left atrial size was moderately dilated.  4. The mitral valve is abnormal. There is mild mitral annular calcification present.  5. The aorta is normal in size and structure.  6. The aortic valve has an indeterminate number of cusps. Severe  calcifcation. Aortic valve regurgitation is mild. Moderate aortic stenosis.  AV Area (Vmax):   0.82 cm     AV Area (Vmean):  0.89 cm   AV Area (VTI):    0.85 cm   AV Vmax:          310.50 cm/s AV Vmean:         219.000 cm/s AV VTI:           0.710 m      AV Peak Grad:     38.6 mmHg    AV Mean Grad:     22.0 mmHg     7. Normal LV systolic function; mild LVH; calcified aortic valve with moderate AS (mean gradient 22 mmHg) and mild AI;  moderate LAE; mild MR.   Cardiac cath 04/09/06:  - LM: 20% stenosis - LAD: 30% proximal stenosis, 60% mid stenosis, 60-70% distal stenosis.  D1 30-40% stenosis. -LCx: Multiple 30% discrete lesions in proximal and mid vessel.  OM1 40% stenosis. -RCA: Dominant vessel. 30-40% mid stenosis.  Diffuse distal PDA and posterior lateral disease in <1.5 mm vessels.  - LV: Global hypokinesis with an EF 25-30%. 1-2+ MR. 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  He is not a candidate for Coumadin right now.     Past Medical History:  Diagnosis Date  . Allergic rhinitis 08/08/2013   takes Loratadine daily   .  Aortic stenosis 09/08/2011   With mild aortic regurgitation, mean gradient 13 mmHg   . Cataract    right but immature  . Complication of anesthesia    stopped breathing - during shoulder surgery 2009-2010  . Coronary artery disease 10/02/2009   Cardiac cath (October 2007): 20% left main, 60% mid LAD, 60-70% distal LAD, 30-40% first diagonal, 30% circumflex, 40% OM, 30-40% RCA   . Degenerative joint disease of shoulder 08/03/2013   Bilateral, s/p right total shoulder arthroplasty 05/29/2011 and left shoulder arthroplasty 06/08/2014  . Diastolic dysfunction [id number]   Echo (04/04/2016): Grade I  . Diverticulosis 10/07/2013   Seen on colonoscopy in 2007   . Erectile dysfunction 05/07/2009  . Essential hypertension 08/03/2013   had been on Lisinopril but stopped per MD  . First degree AV block 03/14/2016  . Gastric ulcer 10/07/2013   Seen on EGD 04/10/2006   . Hemorrhoids 10/07/2013  . Hyperlipidemia 10/02/2009  . Paroxysmal atrial fibrillation ([place]) 07/14/2006   One episode, provoked by alcohol use disorder, briefly anticoagulated with warfarin, then switched to aspirin alone  . Sciatica associated with disorder of lumbar spine 08/03/2013   Anterolisthesis with right L 4-5 nerve root compression.  Treated with epidural  injections and Physical Therapy.   . Seborrheic keratosis 10/07/2013  . Tubular adenoma of colon     Past Surgical History:  Procedure Laterality Date  . CARDIAC CATHETERIZATION  2007  . CARDIOVERSION    . COLONOSCOPY    . JOINT REPLACEMENT    . KNEE ARTHROSCOPY WITH MENISCAL REPAIR Right 01/30/2011  . right finger surgery     as a child  . TONSILLECTOMY    . TOTAL KNEE ARTHROPLASTY Left 07/28/2017   Procedure: LEFT TOTAL KNEE ARTHROPLASTY;  Surgeon: Renette Butters, MD;  Location: [facility];  Service: Orthopedics;  Laterality: Left;  . TOTAL SHOULDER ARTHROPLASTY  05/29/2011   Procedure: TOTAL SHOULDER ARTHROPLASTY;  Surgeon: Metta Clines Supple;  Location: [facility];  Service: Orthopedics;  Laterality: Right;  . TOTAL SHOULDER ARTHROPLASTY Left 06/08/2014   DR SUPPLE  . TOTAL SHOULDER ARTHROPLASTY Left 06/08/2014   Procedure: LEFT TOTAL SHOULDER ARTHROPLASTY;  Surgeon: Marin Shutter, MD;  Location: [facility];  Service: Orthopedics;  Laterality: Left;    MEDICATIONS: . aspirin EC 81 MG tablet  . atorvastatin (LIPITOR) 40 MG tablet  . carvedilol (COREG) 12.5 MG tablet  . indomethacin (INDOCIN) 50 MG capsule  . naproxen sodium (ALEVE) 220 MG tablet  . sodium chloride (OCEAN) 0.65 % SOLN nasal spray   No current facility-administered medications for this encounter.     Myra Gianotti, PA-C Surgical Short Stay/Anesthesiology [facility] Phone [phone] [facility] Phone [phone] 03/04/2019 2:45 PM

## 2019-03-08 ENCOUNTER — Other Ambulatory Visit (HOSPITAL_COMMUNITY)
Admission: RE | Admit: 2019-03-08 | Discharge: 2019-03-08 | Disposition: A | Discharge status: Home / Self Care | Payer: Medicare HMO | Source: Ambulatory Visit | Attending: General Surgery | Admitting: General Surgery

## 2019-03-08 DIAGNOSIS — Z20828 Contact with and (suspected) exposure to other viral communicable diseases: Secondary | ICD-10-CM | POA: Insufficient documentation

## 2019-03-08 DIAGNOSIS — Z01812 Encounter for preprocedural laboratory examination: Secondary | ICD-10-CM | POA: Diagnosis not present

## 2019-03-09 LAB — NOVEL CORONAVIRUS, NAA (HOSP ORDER, SEND-OUT TO REF LAB; TAT 18-24 HRS): SARS-CoV-2, NAA: NOT DETECTED

## 2019-03-10 ENCOUNTER — Telehealth: Payer: Self-pay | Admitting: Student in an Organized Health Care Education/Training Program

## 2019-03-10 NOTE — Telephone Encounter (Signed)
Pt calls and states 2 things in r/t xarelto, he states it causes back pain and it is too costly at 125.00 copay Please advise

## 2019-03-10 NOTE — Telephone Encounter (Signed)
Pt is requesting a callback from Dr Evette Doffing [phone]

## 2019-03-11 ENCOUNTER — Ambulatory Visit: Payer: Self-pay | Admitting: General Surgery

## 2019-03-11 ENCOUNTER — Ambulatory Visit (HOSPITAL_COMMUNITY): Payer: Medicare HMO | Admitting: Anesthesiology

## 2019-03-11 ENCOUNTER — Encounter (HOSPITAL_COMMUNITY): Payer: Self-pay | Admitting: Certified Registered"

## 2019-03-11 ENCOUNTER — Ambulatory Visit (HOSPITAL_COMMUNITY)
Admission: RE | Admit: 2019-03-11 | Discharge: 2019-03-11 | Disposition: A | Discharge status: Home / Self Care | Payer: Medicare HMO | Attending: General Surgery | Admitting: General Surgery

## 2019-03-11 ENCOUNTER — Ambulatory Visit (HOSPITAL_COMMUNITY): Payer: Medicare HMO | Admitting: Vascular Surgery

## 2019-03-11 ENCOUNTER — Other Ambulatory Visit: Payer: Self-pay

## 2019-03-11 ENCOUNTER — Encounter (HOSPITAL_COMMUNITY)
Admission: RE | Disposition: A | Discharge status: Home / Self Care | Payer: Self-pay | Source: Home / Self Care | Attending: General Surgery

## 2019-03-11 DIAGNOSIS — M1990 Unspecified osteoarthritis, unspecified site: Secondary | ICD-10-CM | POA: Insufficient documentation

## 2019-03-11 DIAGNOSIS — I350 Nonrheumatic aortic (valve) stenosis: Secondary | ICD-10-CM | POA: Diagnosis not present

## 2019-03-11 DIAGNOSIS — Z7982 Long term (current) use of aspirin: Secondary | ICD-10-CM | POA: Diagnosis not present

## 2019-03-11 DIAGNOSIS — K420 Umbilical hernia with obstruction, without gangrene: Secondary | ICD-10-CM | POA: Diagnosis not present

## 2019-03-11 DIAGNOSIS — I2510 Atherosclerotic heart disease of native coronary artery without angina pectoris: Secondary | ICD-10-CM | POA: Insufficient documentation

## 2019-03-11 DIAGNOSIS — Z8249 Family history of ischemic heart disease and other diseases of the circulatory system: Secondary | ICD-10-CM | POA: Insufficient documentation

## 2019-03-11 DIAGNOSIS — I4892 Unspecified atrial flutter: Secondary | ICD-10-CM | POA: Diagnosis not present

## 2019-03-11 DIAGNOSIS — I10 Essential (primary) hypertension: Secondary | ICD-10-CM | POA: Diagnosis not present

## 2019-03-11 DIAGNOSIS — Z7901 Long term (current) use of anticoagulants: Secondary | ICD-10-CM | POA: Diagnosis not present

## 2019-03-11 DIAGNOSIS — Z79899 Other long term (current) drug therapy: Secondary | ICD-10-CM | POA: Diagnosis not present

## 2019-03-11 DIAGNOSIS — I4891 Unspecified atrial fibrillation: Secondary | ICD-10-CM | POA: Diagnosis not present

## 2019-03-11 DIAGNOSIS — Z87891 Personal history of nicotine dependence: Secondary | ICD-10-CM | POA: Diagnosis not present

## 2019-03-11 DIAGNOSIS — E785 Hyperlipidemia, unspecified: Secondary | ICD-10-CM | POA: Insufficient documentation

## 2019-03-11 HISTORY — PX: INSERTION OF MESH: SHX5868

## 2019-03-11 HISTORY — PX: UMBILICAL HERNIA REPAIR: SHX196

## 2019-03-11 LAB — PROTIME-INR
INR: 1.4 — ABNORMAL HIGH (ref 0.8–1.2)
Prothrombin Time: 17.4 seconds — ABNORMAL HIGH (ref 11.4–15.2)

## 2019-03-11 SURGERY — REPAIR, HERNIA, UMBILICAL, LAPAROSCOPIC
Anesthesia: General | Site: Abdomen

## 2019-03-11 MED ORDER — LIDOCAINE 2% (20 MG/ML) 5 ML SYRINGE
INTRAMUSCULAR | Status: DC | PRN
  Administered 2019-03-11: 80 mg via INTRAVENOUS

## 2019-03-11 MED ORDER — SUGAMMADEX SODIUM 200 MG/2ML IV SOLN
INTRAVENOUS | Status: DC | PRN
  Administered 2019-03-11: 180 mg via INTRAVENOUS

## 2019-03-11 MED ORDER — ACETAMINOPHEN 500 MG PO TABS
1000.0000 mg | ORAL_TABLET | ORAL | Status: AC
  Administered 2019-03-11: 1000 mg via ORAL
  Filled 2019-03-11: qty 2

## 2019-03-11 MED ORDER — SODIUM CHLORIDE 0.9 % IV SOLN
INTRAVENOUS | Status: DC | PRN
  Administered 2019-03-11: 25 ug/min via INTRAVENOUS

## 2019-03-11 MED ORDER — PHENYLEPHRINE 40 MCG/ML (10ML) SYRINGE FOR IV PUSH (FOR BLOOD PRESSURE SUPPORT)
PREFILLED_SYRINGE | INTRAVENOUS | Status: AC
  Filled 2019-03-11: qty 10

## 2019-03-11 MED ORDER — BUPIVACAINE HCL 0.25 % IJ SOLN
INTRAMUSCULAR | Status: DC | PRN
  Administered 2019-03-11: 7 mL

## 2019-03-11 MED ORDER — 0.9 % SODIUM CHLORIDE (POUR BTL) OPTIME
TOPICAL | Status: DC | PRN
  Administered 2019-03-11: 1000 mL

## 2019-03-11 MED ORDER — OXYCODONE HCL 5 MG PO TABS
5.0000 mg | ORAL_TABLET | Freq: Once | ORAL | Status: AC | PRN
  Administered 2019-03-11: 5 mg via ORAL

## 2019-03-11 MED ORDER — LIDOCAINE 2% (20 MG/ML) 5 ML SYRINGE
INTRAMUSCULAR | Status: AC
  Filled 2019-03-11: qty 5

## 2019-03-11 MED ORDER — CELECOXIB 200 MG PO CAPS
200.0000 mg | ORAL_CAPSULE | ORAL | Status: AC
  Administered 2019-03-11: 200 mg via ORAL
  Filled 2019-03-11: qty 1

## 2019-03-11 MED ORDER — FENTANYL CITRATE (PF) 250 MCG/5ML IJ SOLN
INTRAMUSCULAR | Status: AC
  Filled 2019-03-11: qty 5

## 2019-03-11 MED ORDER — HYDROMORPHONE HCL 1 MG/ML IJ SOLN
0.2500 mg | INTRAMUSCULAR | Status: DC | PRN
  Administered 2019-03-11: 0.5 mg via INTRAVENOUS

## 2019-03-11 MED ORDER — HYDROMORPHONE HCL 1 MG/ML IJ SOLN
INTRAMUSCULAR | Status: AC
  Filled 2019-03-11: qty 1

## 2019-03-11 MED ORDER — ONDANSETRON HCL 4 MG/2ML IJ SOLN
INTRAMUSCULAR | Status: AC
  Filled 2019-03-11: qty 2

## 2019-03-11 MED ORDER — PROPOFOL 10 MG/ML IV BOLUS
INTRAVENOUS | Status: AC
  Filled 2019-03-11: qty 20

## 2019-03-11 MED ORDER — LACTATED RINGERS IV SOLN
INTRAVENOUS | Status: DC
  Administered 2019-03-11: 09:00:00 via INTRAVENOUS

## 2019-03-11 MED ORDER — DEXAMETHASONE SODIUM PHOSPHATE 10 MG/ML IJ SOLN
INTRAMUSCULAR | Status: AC
  Filled 2019-03-11: qty 1

## 2019-03-11 MED ORDER — OXYCODONE HCL 5 MG/5ML PO SOLN: 5.0000 mg | Freq: Once | ORAL | Status: AC | PRN

## 2019-03-11 MED ORDER — CEFAZOLIN SODIUM-DEXTROSE 2-4 GM/100ML-% IV SOLN
2.0000 g | INTRAVENOUS | Status: AC
  Administered 2019-03-11: 2 g via INTRAVENOUS
  Filled 2019-03-11: qty 100

## 2019-03-11 MED ORDER — PHENYLEPHRINE 40 MCG/ML (10ML) SYRINGE FOR IV PUSH (FOR BLOOD PRESSURE SUPPORT)
PREFILLED_SYRINGE | INTRAVENOUS | Status: DC | PRN
  Administered 2019-03-11 (×2): 80 ug via INTRAVENOUS
  Administered 2019-03-11: 40 ug via INTRAVENOUS

## 2019-03-11 MED ORDER — ROCURONIUM BROMIDE 10 MG/ML (PF) SYRINGE
PREFILLED_SYRINGE | INTRAVENOUS | Status: DC | PRN
  Administered 2019-03-11: 50 mg via INTRAVENOUS
  Administered 2019-03-11: 10 mg via INTRAVENOUS

## 2019-03-11 MED ORDER — TRAMADOL HCL 50 MG PO TABS: 50.0000 mg | ORAL_TABLET | Freq: Four times a day (QID) | ORAL | 0 refills | Status: DC | PRN

## 2019-03-11 MED ORDER — GABAPENTIN 300 MG PO CAPS
300.0000 mg | ORAL_CAPSULE | ORAL | Status: AC
  Administered 2019-03-11: 300 mg via ORAL
  Filled 2019-03-11: qty 1

## 2019-03-11 MED ORDER — PROPOFOL 10 MG/ML IV BOLUS
INTRAVENOUS | Status: DC | PRN
  Administered 2019-03-11: 100 mg via INTRAVENOUS

## 2019-03-11 MED ORDER — CHLORHEXIDINE GLUCONATE CLOTH 2 % EX PADS: 6.0000 | MEDICATED_PAD | Freq: Once | CUTANEOUS | Status: DC

## 2019-03-11 MED ORDER — FENTANYL CITRATE (PF) 100 MCG/2ML IJ SOLN
INTRAMUSCULAR | Status: DC | PRN
  Administered 2019-03-11: 100 ug via INTRAVENOUS
  Administered 2019-03-11: 25 ug via INTRAVENOUS

## 2019-03-11 MED ORDER — OXYCODONE HCL 5 MG PO TABS
ORAL_TABLET | ORAL | Status: AC
  Filled 2019-03-11: qty 1

## 2019-03-11 MED ORDER — MIDAZOLAM HCL 2 MG/2ML IJ SOLN
INTRAMUSCULAR | Status: AC
  Filled 2019-03-11: qty 2

## 2019-03-11 MED ORDER — ONDANSETRON HCL 4 MG/2ML IJ SOLN
INTRAMUSCULAR | Status: DC | PRN
  Administered 2019-03-11: 4 mg via INTRAVENOUS

## 2019-03-11 MED ORDER — ROCURONIUM BROMIDE 10 MG/ML (PF) SYRINGE
PREFILLED_SYRINGE | INTRAVENOUS | Status: AC
  Filled 2019-03-11: qty 10

## 2019-03-11 MED ORDER — PROMETHAZINE HCL 25 MG/ML IJ SOLN: 6.2500 mg | INTRAMUSCULAR | Status: DC | PRN

## 2019-03-11 MED ORDER — DEXAMETHASONE SODIUM PHOSPHATE 10 MG/ML IJ SOLN
INTRAMUSCULAR | Status: DC | PRN
  Administered 2019-03-11: 5 mg via INTRAVENOUS

## 2019-03-11 MED ORDER — BUPIVACAINE HCL (PF) 0.25 % IJ SOLN
INTRAMUSCULAR | Status: AC
  Filled 2019-03-11: qty 30

## 2019-03-11 MED ORDER — STERILE WATER FOR IRRIGATION IR SOLN
Status: DC | PRN
  Administered 2019-03-11: 1000 mL

## 2019-03-11 SURGICAL SUPPLY — 45 items
ADH SKN CLS APL DERMABOND .7 (GAUZE/BANDAGES/DRESSINGS) ×1
APL PRP STRL LF DISP 70% ISPRP (MISCELLANEOUS) ×1
BINDER ABDOMINAL 12 ML 46-62 (SOFTGOODS) ×2 IMPLANT
BLADE CLIPPER SURG (BLADE) IMPLANT
CANISTER SUCT 3000ML PPV (MISCELLANEOUS) IMPLANT
CHLORAPREP W/TINT 26 (MISCELLANEOUS) ×2 IMPLANT
COVER SURGICAL LIGHT HANDLE (MISCELLANEOUS) ×2 IMPLANT
COVER WAND RF STERILE (DRAPES) ×1 IMPLANT
DERMABOND ADVANCED (GAUZE/BANDAGES/DRESSINGS) ×1
DERMABOND ADVANCED .7 DNX12 (GAUZE/BANDAGES/DRESSINGS) ×1 IMPLANT
DEVICE SECURE STRAP 25 ABSORB (INSTRUMENTS) ×4 IMPLANT
DEVICE TROCAR PUNCTURE CLOSURE (ENDOMECHANICALS) ×2 IMPLANT
ELECT REM PT RETURN 9FT ADLT (ELECTROSURGICAL) ×2
ELECTRODE REM PT RTRN 9FT ADLT (ELECTROSURGICAL) ×1 IMPLANT
GLOVE BIO SURGEON STRL SZ7.5 (GLOVE) ×2 IMPLANT
GOWN STRL REUS W/ TWL LRG LVL3 (GOWN DISPOSABLE) ×2 IMPLANT
GOWN STRL REUS W/ TWL XL LVL3 (GOWN DISPOSABLE) ×1 IMPLANT
GOWN STRL REUS W/TWL LRG LVL3 (GOWN DISPOSABLE) ×4
GOWN STRL REUS W/TWL XL LVL3 (GOWN DISPOSABLE) ×2
KIT BASIN OR (CUSTOM PROCEDURE TRAY) ×2 IMPLANT
KIT TURNOVER KIT B (KITS) ×2 IMPLANT
MARKER SKIN DUAL TIP RULER LAB (MISCELLANEOUS) ×2 IMPLANT
MESH VENTRALIGHT ST 6IN CRC (Mesh General) ×1 IMPLANT
NDL INSUFFLATION 14GA 120MM (NEEDLE) ×1 IMPLANT
NDL SPNL 22GX3.5 QUINCKE BK (NEEDLE) IMPLANT
NEEDLE INSUFFLATION 14GA 120MM (NEEDLE) ×2 IMPLANT
NEEDLE SPNL 22GX3.5 QUINCKE BK (NEEDLE) IMPLANT
NS IRRIG 1000ML POUR BTL (IV SOLUTION) ×2 IMPLANT
PAD ARMBOARD 7.5X6 YLW CONV (MISCELLANEOUS) ×4 IMPLANT
SCISSORS LAP 5X35 DISP (ENDOMECHANICALS) ×2 IMPLANT
SET IRRIG TUBING LAPAROSCOPIC (IRRIGATION / IRRIGATOR) IMPLANT
SET TUBE SMOKE EVAC HIGH FLOW (TUBING) ×2 IMPLANT
SLEEVE ENDOPATH XCEL 5M (ENDOMECHANICALS) ×2 IMPLANT
SUT CHROMIC 2 0 SH (SUTURE) ×2 IMPLANT
SUT ETHIBOND 0 MO6 C/R (SUTURE) ×1 IMPLANT
SUT MNCRL AB 4-0 PS2 18 (SUTURE) ×2 IMPLANT
SUT NOVA 1 T20/GS 25DT (SUTURE) IMPLANT
SUT PROLENE 2 0 KS (SUTURE) ×4 IMPLANT
TOWEL GREEN STERILE (TOWEL DISPOSABLE) ×2 IMPLANT
TOWEL GREEN STERILE FF (TOWEL DISPOSABLE) ×2 IMPLANT
TRAY LAPAROSCOPIC MC (CUSTOM PROCEDURE TRAY) ×2 IMPLANT
TROCAR XCEL BLUNT TIP 100MML (ENDOMECHANICALS) IMPLANT
TROCAR XCEL NON-BLD 11X100MML (ENDOMECHANICALS) IMPLANT
TROCAR XCEL NON-BLD 5MMX100MML (ENDOMECHANICALS) ×2 IMPLANT
WATER STERILE IRR 1000ML POUR (IV SOLUTION) ×2 IMPLANT

## 2019-03-11 NOTE — Op Note (Signed)
03/11/2019  11:38 AM  PATIENT:  [name]  73 y.o. male  PRE-OPERATIVE DIAGNOSIS:  Incarcerated UMBILICAL HERNIA  POST-OPERATIVE DIAGNOSIS:  Incarcerated UMBILICAL HERNIA  PROCEDURE:  Procedure(s): LAPAROSCOPIC UMBILICAL HERNIA (N/A) Insertion Of Mesh (N/A)  SURGEON:  Surgeon(s) and Role:    * Ralene Ok, MD - Primary  ANESTHESIA:   local and general  EBL:  minimal   BLOOD ADMINISTERED:none  DRAINS: none   LOCAL MEDICATIONS USED:  BUPIVICAINE   SPECIMEN:  No Specimen  DISPOSITION OF SPECIMEN:  N/A  COUNTS:  YES  TOURNIQUET:  * No tourniquets in log *  DICTATION: .Dragon Dictation   Details of the procedure:   After the patient was consented patient was taken back to the operating room patient was then placed in supine position bilateral SCDs in place.  The patient was prepped and draped in the usual sterile fashion. After antibiotics were confirmed a timeout was called and all facts were verified. The Veress needle technique was used to insuflate the abdomen at Palmer's point. The abdomen was insufflated to 14 mm mercury. Subsequently a 5 mm trocar was placed a camera inserted there was no injury to any intra-abdominal organs.    There was seen to be an incarcerated  1.5cm hernia.  A second camera port was in placed into the left lower quadrant.   At this the Falicform ligament was taken down with Bovie cautery maintaining hemostasis.   I proceeded to reduce the hernia contents.  The hernia sac was dissected out of the hernia and disposed.  The fascia at the hernia was reapproximated using a 0 Ethibondsx 3.  Once the hernia was cleared away, a Bard Ventralight 15.2cm  mesh was inserted into the abdomen.  The mesh was secured circumferentially with am Securestrap tacker in a double crown fashion.  2-0 prolenes were used at 3:00, 6:00, 9:00, and 12:00 as transfascial sutures using a endoclose decive.  The left lower quadrant trochar site was reapproximated with  a 0 vicryl x 1  The omentum was brought over the area of the mesh. The pneumoperitoneum was evacuated  & all trocars  were removed. The skin was reapproximated with 4-0  Monocryl sutures in a subcuticular fashion. The skin was dressed with Dermabond.  The patient was taken to the recovery room in stable condition.  Type of repair -primary suture & mesh  Mesh overlap - 6cm  Placement of mesh -  beneath fascia and into peritoneal cavity   PLAN OF CARE: Discharge to home after PACU  PATIENT DISPOSITION:  PACU - hemodynamically stable.   Delay start of Pharmacological VTE agent (>24hrs) due to surgical blood loss or risk of bleeding: not applicable

## 2019-03-11 NOTE — Discharge Instructions (Signed)
[facility] _______[facility] ° °HERNIA REPAIR: POST OP INSTRUCTIONS ° °Always review your discharge instruction sheet given to you by the facility where your surgery was performed. °IF YOU HAVE DISABILITY OR FAMILY LEAVE FORMS, YOU MUST BRING THEM TO THE OFFICE FOR PROCESSING.   °DO NOT GIVE THEM TO YOUR DOCTOR. ° °1. A  prescription for pain medication may be given to you upon discharge.  Take your pain medication as prescribed, if needed.  If narcotic pain medicine is not needed, then you may take acetaminophen (Tylenol) or ibuprofen (Advil) as needed. °2. Take your usually prescribed medications unless otherwise directed. °If you need a refill on your pain medication, please contact your pharmacy.  They will contact our office to request authorization. Prescriptions will not be filled after 5 pm or on week-ends. °3. You should follow a light diet the first 24 hours after arrival home, such as soup and crackers, etc.  Be sure to include lots of fluids daily.  Resume your normal diet the day after surgery. °4.Most patients will experience some swelling and bruising around the umbilicus or in the groin and scrotum.  Ice packs and reclining will help.  Swelling and bruising can take several days to resolve.  °6. It is common to experience some constipation if taking pain medication after surgery.  Increasing fluid intake and taking a stool softener (such as Colace) will usually help or prevent this problem from occurring.  A mild laxative (Milk of Magnesia or Miralax) should be taken according to package directions if there are no bowel movements after 48 hours. °7. Unless discharge instructions indicate otherwise, you may remove your bandages 24-48 hours after surgery, and you may shower at that time.  You may have steri-strips (small skin tapes) in place directly over the incision.  These strips should be left on the skin for 7-10 days.  If your surgeon used skin glue on the incision, you may shower in  24 hours.  The glue will flake off over the next 2-3 weeks.  Any sutures or staples will be removed at the office during your follow-up visit. °8. ACTIVITIES:  You may resume regular (light) daily activities beginning the next day--such as daily self-care, walking, climbing stairs--gradually increasing activities as tolerated.  You may have sexual intercourse when it is comfortable.  Refrain from any heavy lifting or straining until approved by your doctor. ° °a.You may drive when you are no longer taking prescription pain medication, you can comfortably wear a seatbelt, and you can safely maneuver your car and apply brakes. °b.RETURN TO WORK:   °_____________________________________________ ° °9.You should see your doctor in the office for a follow-up appointment approximately 2-3 weeks after your surgery.  Make sure that you call for this appointment within a day or two after you arrive home to insure a convenient appointment time. °10.OTHER INSTRUCTIONS: _________________________ °   _____________________________________ ° °WHEN TO CALL YOUR DOCTOR: °1. Fever over 101.0 °2. Inability to urinate °3. Nausea and/or vomiting °4. Extreme swelling or bruising °5. Continued bleeding from incision. °6. Increased pain, redness, or drainage from the incision ° °The clinic staff is available to answer your questions during regular business hours.  Please don’t hesitate to call and ask to speak to one of the nurses for clinical concerns.  If you have a medical emergency, go to the nearest emergency room or call 911.  A surgeon from [facility] is always on call at the hospital ° ° °[address], [address], [place], [place]  [address] ? ° [address] [address], [place],    [address] °[phone] ? [phone] ? FAX [phone] °Web site: [email or web address] ° °

## 2019-03-11 NOTE — Transfer of Care (Signed)
Immediate Anesthesia Transfer of Care Note  Patient: [name]  Procedure(s) Performed: LAPAROSCOPIC UMBILICAL HERNIA (N/A Abdomen) Insertion Of Mesh (N/A Abdomen)  Patient Location: PACU  Anesthesia Type:General  Level of Consciousness: awake, alert  and oriented  Airway & Oxygen Therapy: Patient Spontanous Breathing and Patient connected to nasal cannula oxygen  Post-op Assessment: Report given to RN  Post vital signs: Reviewed and stable  Last Vitals:  Vitals Value Taken Time  BP    Temp    Pulse 107 03/11/19 1156  Resp 27 03/11/19 1156  SpO2 94 % 03/11/19 1156  Vitals shown include unvalidated device data.  Last Pain:  Vitals:   03/11/19 0914  TempSrc: Oral  PainSc:       Patients Stated Pain Goal: 3 ([id number])  Complications: No apparent anesthesia complications

## 2019-03-11 NOTE — Anesthesia Procedure Notes (Signed)
Procedure Name: Intubation Date/Time: 03/11/2019 10:44 AM Performed by: Barrington Ellison, CRNA Pre-anesthesia Checklist: Patient identified, Emergency Drugs available, Suction available and Patient being monitored Patient Re-evaluated:Patient Re-evaluated prior to induction Oxygen Delivery Method: Circle System Utilized Preoxygenation: Pre-oxygenation with 100% oxygen Induction Type: IV induction Ventilation: Mask ventilation without difficulty Laryngoscope Size: Mac and 4 Grade View: Grade I Tube type: Oral Tube size: 7.5 mm Number of attempts: 1 Airway Equipment and Method: Stylet and Oral airway Placement Confirmation: ETT inserted through vocal cords under direct vision,  positive ETCO2 and breath sounds checked- equal and bilateral Secured at: 21 cm Tube secured with: Tape Dental Injury: Teeth and Oropharynx as per pre-operative assessment

## 2019-03-11 NOTE — Interval H&P Note (Signed)
History and Physical Interval Note:  03/11/2019 9:07 AM  [name]  has presented today for surgery, with the diagnosis of UMBILICAL HERNIA.  The various methods of treatment have been discussed with the patient and family. After consideration of risks, benefits and other options for treatment, the patient has consented to  Procedure(s): LAPAROSCOPIC UMBILICAL HERNIA WITH MESH (N/A) as a surgical intervention.  The patient's history has been reviewed, patient examined, no change in status, stable for surgery.  I have reviewed the patient's chart and labs.  Questions were answered to the patient's satisfaction.     Ralene Ok

## 2019-03-11 NOTE — Anesthesia Postprocedure Evaluation (Signed)
Anesthesia Post Note  Patient: [name]  Procedure(s) Performed: LAPAROSCOPIC UMBILICAL HERNIA (N/A Abdomen) Insertion Of Mesh (N/A Abdomen)     Anesthesia Type: General    Last Vitals:  Vitals:   03/11/19 1155 03/11/19 1210  BP: (!) 162/89 (!) 143/90  Pulse: 71 62  Resp: 18 20  Temp: 36.7 C   SpO2: 96% 93%    Last Pain:  Vitals:   03/11/19 1155  TempSrc:   PainSc: [place]

## 2019-03-14 ENCOUNTER — Encounter (HOSPITAL_COMMUNITY): Payer: Self-pay | Admitting: General Surgery

## 2019-03-16 NOTE — Telephone Encounter (Signed)
I attempted to call today, but no answer. Will try again later.

## 2019-03-17 ENCOUNTER — Telehealth: Payer: Self-pay | Admitting: Student in an Organized Health Care Education/Training Program

## 2019-03-17 ENCOUNTER — Encounter: Payer: Self-pay | Admitting: Student in an Organized Health Care Education/Training Program

## 2019-03-17 DIAGNOSIS — I4891 Unspecified atrial fibrillation: Secondary | ICD-10-CM

## 2019-03-17 NOTE — Telephone Encounter (Signed)
Spoke with patient today after hernia surgery. He is feeling ok, back to normal activity, some pain at the abdomen but getting better. He has Atrial Fib that was found on pre op ECG. He is rate controlled and symptomatically doing well. I recommended DOAC for primary stroke prevention, he tried a week of xarelto but stopped because he thinks it made his back hurt. Cost was also prohibitive, pharmacy tells him both xarelto and eliquis would cost him $150 a month co pay. He has done coumadin in the past, a few months in 2007, but then stopped when they thought the a fib was due to acute illness. He is reluctant to restart coumadin because of time commitment with follow up visits.   I am going to refer him to the a fib clinic to work through these issues. Will also route to Dr. Elie Confer, I am wondering if we can get him a card to help with cost of eliquis?

## 2019-03-18 ENCOUNTER — Telehealth (HOSPITAL_COMMUNITY): Payer: Self-pay | Admitting: Physician Assistant

## 2019-03-18 NOTE — Telephone Encounter (Signed)
Called and left message for patient to call back.  Need to schedule appt for next week in A-fib clinic per staff message from Rushie Goltz, RN.  Pt referred by Dr. Evette Doffing (internal med)

## 2019-03-21 ENCOUNTER — Other Ambulatory Visit: Payer: Self-pay

## 2019-03-21 ENCOUNTER — Ambulatory Visit (INDEPENDENT_AMBULATORY_CARE_PROVIDER_SITE_OTHER): Payer: Medicare HMO | Admitting: *Deleted

## 2019-03-21 DIAGNOSIS — Z23 Encounter for immunization: Secondary | ICD-10-CM

## 2019-03-21 DIAGNOSIS — I4819 Other persistent atrial fibrillation: Secondary | ICD-10-CM

## 2019-03-21 NOTE — Telephone Encounter (Signed)
Using "AnticoagEvaluator" App as provided by the [employer], the patient was shown is annualized risk for having a "stroke" event and "bleeding event (relative to his CHAD2DS2-VASc score as well as his "HAS-BLED SCORE." We discussed the event rate percentage that this application predicts based upon his age, gender, and other risks factors identified for both stroke and bleeding within the APP--for his options of:  Doing "nothing" (placebo) vs. The use of Aspirin vs. The use of rivaroxaban/Xarelto vs. The use of warfarin/Coumadin. The only question the patient had was "what was the difference between rivaroxaban/Xarelto vs. Apixaban/Eliquis?" the patient was apprised of the following:  one medication is taken once-daily, the other is taken twice daily. It was pointed out that the two medications cannot be compared "head-to-head" since they have never been studied in that fashion, only against warfarin/Coumadin. He indicates he is being seen in the AFib Clinic on Tuesday 22-Mar-2019. I urged him to, in a "shared-decision making discussion" to address these concepts with his provider in the AFib Clinic (as well as with Dr. Zonia Kief into considerations of:  Costs of the DOACs (patient states his copay will be approximately $150 for rivaroxaban/Xarelto--he states he was told that apixaban/Eliquis was similarly a tier 3 copay drug under HIS prescription plan). I urged him to factor in the costs of clinic visits for his INR measurements on warfarin therapy as well (this is typically covered under the "medical" side of one's plan). As soon as his Physician(s) and the patient come to a decision, we will commence therapy.

## 2019-03-22 ENCOUNTER — Telehealth: Payer: Self-pay | Admitting: Pharmacist

## 2019-03-22 ENCOUNTER — Encounter (HOSPITAL_COMMUNITY): Payer: Self-pay | Admitting: Physician Assistant

## 2019-03-22 ENCOUNTER — Ambulatory Visit (HOSPITAL_COMMUNITY)
Admission: RE | Admit: 2019-03-22 | Discharge: 2019-03-22 | Disposition: A | Discharge status: Home / Self Care | Payer: Medicare HMO | Source: Ambulatory Visit | Attending: Physician Assistant | Admitting: Physician Assistant

## 2019-03-22 VITALS — BP 132/70 | HR 67 | Ht 66.0 in | Wt 184.0 lb

## 2019-03-22 DIAGNOSIS — I2510 Atherosclerotic heart disease of native coronary artery without angina pectoris: Secondary | ICD-10-CM | POA: Diagnosis not present

## 2019-03-22 DIAGNOSIS — Z6829 Body mass index (BMI) 29.0-29.9, adult: Secondary | ICD-10-CM | POA: Diagnosis not present

## 2019-03-22 DIAGNOSIS — I4819 Other persistent atrial fibrillation: Secondary | ICD-10-CM | POA: Diagnosis not present

## 2019-03-22 DIAGNOSIS — I10 Essential (primary) hypertension: Secondary | ICD-10-CM | POA: Insufficient documentation

## 2019-03-22 DIAGNOSIS — Z8249 Family history of ischemic heart disease and other diseases of the circulatory system: Secondary | ICD-10-CM | POA: Diagnosis not present

## 2019-03-22 DIAGNOSIS — E669 Obesity, unspecified: Secondary | ICD-10-CM | POA: Diagnosis not present

## 2019-03-22 DIAGNOSIS — Z7901 Long term (current) use of anticoagulants: Secondary | ICD-10-CM | POA: Insufficient documentation

## 2019-03-22 DIAGNOSIS — Z79899 Other long term (current) drug therapy: Secondary | ICD-10-CM | POA: Insufficient documentation

## 2019-03-22 DIAGNOSIS — I428 Other cardiomyopathies: Secondary | ICD-10-CM | POA: Diagnosis not present

## 2019-03-22 DIAGNOSIS — Z87891 Personal history of nicotine dependence: Secondary | ICD-10-CM | POA: Diagnosis not present

## 2019-03-22 DIAGNOSIS — E785 Hyperlipidemia, unspecified: Secondary | ICD-10-CM | POA: Insufficient documentation

## 2019-03-22 LAB — TSH: TSH: 2.624 u[IU]/mL (ref 0.350–4.500)

## 2019-03-22 MED ORDER — WARFARIN SODIUM 5 MG PO TABS: 5.0000 mg | ORAL_TABLET | Freq: Every day | ORAL | 1 refills | Status: DC

## 2019-03-22 NOTE — Telephone Encounter (Signed)
Called patient and scheduled him a new coumadin patient appointment for Monday 9/28. He will start coumadin tomorrow night. Will start with warfarin 5mg  daily.

## 2019-03-22 NOTE — Progress Notes (Signed)
Primary Care Physician: Axel Filler, MD Primary Cardiologist: Dr Stanford Breed Primary Electrophysiologist: none Referring Physician: Dr Marlou Sa is a 73 y.o. male with a history of AS, CAD, HTN, transient NICM in the setting of ETOH abuse and afib, HLD, and persistent atrial fibrillation who presents for consultation in the [facility]. Patient was first diagnosed with afib in 2007 in the setting of NICM and ETOH abuse. He had not had any reoccurrence until 03/03/19 when his preop ECG for a hernia repair showed rate controlled afib. Patient had recent treatment for gout flare with indomethacin and Kenolog injections on 01/27/19 and 02/24/19. Patient was unaware of his arrhythmia and asymptomatic. Patient was started on Xarelto (held for hernia surgery) but unfortunately this was cost prohibitive. Patient denies any alcohol use since 2007 and denies snoring or daytime somnolence.   Patient remains unaware of his arrhythmia today. He is recovering well from his hernia surgery.   Today, he denies symptoms of palpitations, chest pain, shortness of breath, orthopnea, PND, lower extremity edema, dizziness, presyncope, syncope, snoring, daytime somnolence, bleeding, or neurologic sequela. The patient is tolerating medications without difficulties and is otherwise without complaint today.    Atrial Fibrillation Risk Factors:  he does not have symptoms or diagnosis of sleep apnea. he does not have a history of rheumatic fever. he does have a history of alcohol use. The patient does not have a history of early familial atrial fibrillation or other arrhythmias.  he has a BMI of Body mass index is 29.7 kg/m.Marland Kitchen Filed Weights   03/22/19 0933  Weight: 83.5 kg    Family History  Problem Relation Age of Onset  . Coronary artery disease Father 49       Died of myocardial infarction  . Bradycardia Mother 73       Requiring pacemeaker placement  .  Coronary artery disease Brother 39       Died of myocardial infarction  . Obesity Daughter   . Healthy Son      Atrial Fibrillation Management history:  Previous antiarrhythmic drugs: none Previous cardioversions: none Previous ablations: none CHADS2VASC score: 3 Anticoagulation history: warfarin, Xarelto (cost prohibitive)    Past Medical History:  Diagnosis Date  . Allergic rhinitis 08/08/2013   takes Loratadine daily   . Aortic stenosis 09/08/2011   With mild aortic regurgitation, mean gradient 13 mmHg   . Cataract    right but immature  . Complication of anesthesia    stopped breathing - during shoulder surgery 2009-2010  . Coronary artery disease 10/02/2009   Cardiac cath (October 2007): 20% left main, 60% mid LAD, 60-70% distal LAD, 30-40% first diagonal, 30% circumflex, 40% OM, 30-40% RCA   . Degenerative joint disease of shoulder 08/03/2013   Bilateral, s/p right total shoulder arthroplasty 05/29/2011 and left shoulder arthroplasty 06/08/2014  . Diastolic dysfunction [id number]   Echo (04/04/2016): Grade I  . Diverticulosis 10/07/2013   Seen on colonoscopy in 2007   . Erectile dysfunction 05/07/2009  . Essential hypertension 08/03/2013   had been on Lisinopril but stopped per MD  . First degree AV block 03/14/2016  . Gastric ulcer 10/07/2013   Seen on EGD 04/10/2006   . Hemorrhoids 10/07/2013  . Hyperlipidemia 10/02/2009  . Paroxysmal atrial fibrillation ([place]) 07/14/2006   One episode, provoked by alcohol use disorder, briefly anticoagulated with warfarin, then switched to aspirin alone  . Sciatica associated with disorder of lumbar spine 08/03/2013   Anterolisthesis  with right L 4-5 nerve root compression.  Treated with epidural injections and Physical Therapy.   . Seborrheic keratosis 10/07/2013  . Tubular adenoma of colon    Past Surgical History:  Procedure Laterality Date  . CARDIAC CATHETERIZATION  2007  . CARDIOVERSION    . COLONOSCOPY    . INSERTION OF MESH N/A  03/11/2019   Procedure: Insertion Of Mesh;  Surgeon: Ralene Ok, MD;  Location: [facility];  Service: General;  Laterality: N/A;  . JOINT REPLACEMENT    . KNEE ARTHROSCOPY WITH MENISCAL REPAIR Right 01/30/2011  . right finger surgery     as a child  . TONSILLECTOMY    . TOTAL KNEE ARTHROPLASTY Left 07/28/2017   Procedure: LEFT TOTAL KNEE ARTHROPLASTY;  Surgeon: Renette Butters, MD;  Location: [facility];  Service: Orthopedics;  Laterality: Left;  . TOTAL SHOULDER ARTHROPLASTY  05/29/2011   Procedure: TOTAL SHOULDER ARTHROPLASTY;  Surgeon: Metta Clines Supple;  Location: [facility];  Service: Orthopedics;  Laterality: Right;  . TOTAL SHOULDER ARTHROPLASTY Left 06/08/2014   DR SUPPLE  . TOTAL SHOULDER ARTHROPLASTY Left 06/08/2014   Procedure: LEFT TOTAL SHOULDER ARTHROPLASTY;  Surgeon: Marin Shutter, MD;  Location: [facility];  Service: Orthopedics;  Laterality: Left;  . UMBILICAL HERNIA REPAIR N/A 03/11/2019   Procedure: LAPAROSCOPIC UMBILICAL HERNIA;  Surgeon: Ralene Ok, MD;  Location: [facility];  Service: General;  Laterality: N/A;    Current Outpatient Medications  Medication Sig Dispense Refill  . atorvastatin (LIPITOR) 40 MG tablet Take 1 tablet (40 mg total) by mouth daily. 90 tablet 3  . carvedilol (COREG) 12.5 MG tablet Take 1 tablet (12.5 mg total) by mouth 2 (two) times daily with a meal. 180 tablet 3   No current facility-administered medications for this encounter.     No Known Allergies  Social History   Socioeconomic History  . Marital status: Married    Spouse name: Not on file  . Number of children: Not on file  . Years of education: Not on file  . Highest education level: Not on file  Occupational History  . Not on file  Social Needs  . Financial resource strain: Not on file  . Food insecurity    Worry: Not on file    Inability: Not on file  . Transportation needs    Medical: Not on file    Non-medical: Not on file  Tobacco Use  . Smoking status: Former Smoker     Types: Cigarettes    Quit date: 07/14/2010    Years since quitting: 8.6  . Smokeless tobacco: Never Used  . Tobacco comment: quit smoking about 11yrs ago  Substance and Sexual Activity  . Alcohol use: No    Frequency: Never    Comment: no alcohol since 07  . Drug use: No  . Sexual activity: Never    Birth control/protection: None  Lifestyle  . Physical activity    Days per week: Not on file    Minutes per session: Not on file  . Stress: Not on file  Relationships  . Social Herbalist on phone: Not on file    Gets together: Not on file    Attends religious service: Not on file    Active member of club or organization: Not on file    Attends meetings of clubs or organizations: Not on file    Relationship status: Not on file  . Intimate partner violence    Fear of current or ex  partner: Not on file    Emotionally abused: Not on file    Physically abused: Not on file    Forced sexual activity: Not on file  Other Topics Concern  . Not on file  [place] with wife and dog.  Former Teacher, early years/pre.     ROS- All systems are reviewed and negative except as per the HPI above.  Physical Exam: Vitals:   03/22/19 0933  BP: 132/70  Pulse: 67  Weight: 83.5 kg  Height: 5\' 6"  (1.676 m)    GEN- The patient is well appearing obese male, alert and oriented x 3 today.   Head- normocephalic, atraumatic Eyes-  Sclera clear, conjunctiva pink Ears- hearing intact Oropharynx- clear Neck- supple  Lungs- Clear to ausculation bilaterally, normal work of breathing Heart- irregular rate and rhythm, no murmurs, rubs or gallops  GI- soft, NT, ND, + BS Extremities- no clubbing, cyanosis, or edema MS- no significant deformity or atrophy Skin- no rash or lesion Psych- euthymic mood, full affect Neuro- strength and sensation are intact  Wt Readings from Last 3 Encounters:  03/22/19 83.5 kg  03/11/19 85.6 kg   03/03/19 85.6 kg    EKG today demonstrates afib HR 67, QRS 86, QTc 445  Cardiac cath 04/09/06:  - LM: 20% stenosis - LAD: 30%proximal stenosis, 60% mid stenosis, 60-70% distal stenosis. D1 30-40% stenosis. -LCx: Multiple 30% discrete lesions in proximal and mid vessel. OM1 40% stenosis. -RCA: Dominant vessel. 30-40% mid stenosis. Diffuse distal PDA and posterior lateral disease in <1.5 mm vessels.  - LV: Global hypokinesis with an EF 25-30%. 1-2+ MR. 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 He is not a candidate forCoumadin right now.  Echo 01/31/19 demonstrated   1. The left ventricle has normal systolic function with an ejection fraction of 60-65%. The cavity size was normal. There is mildly increased left ventricular wall thickness. Left ventricular diastolic function could not be evaluated secondary to atrial  fibrillation.  2. The right ventricle has normal systolic function. The cavity was normal.  3. Left atrial size was moderately dilated.  4. The mitral valve is abnormal. There is mild mitral annular calcification present.  5. The aorta is normal in size and structure.  6. Normal LV systolic function; mild LVH; calcified aortic valve with moderate AS (mean gradient 22 mmHg) and mild AI; moderate LAE; mild MR.  Epic records are reviewed at length today  Assessment and Plan:  1. Persistent atrial fibrillation General education about afib provided and questions answered. ? Recent episode triggered by steroid injection. We had a long discussion about his stroke risk and the risks and benefits of anticoagulation.  Eliquis and Xarelto are both cost prohibitive.  Will refer to Coumadin Clinic for initiation of warfarin.  Stop ASA.  We discussed therapeutic options including DCCV vs rate control. At this point, patient would like a trial of SR to see if he feels any different. Given his age, rate  control, and paucity of symptoms, he may be a good candidate for rate control strategy.  Once patient's INR is therapeutic, will check 4 weekly INRs in anticipation of DCCV.  Continue Coreg 12.5 mg BID Check TSH Lifestyle changes as below.  This patients CHA2DS2-VASc Score and unadjusted Ischemic Stroke Rate (% per year) is equal to 3.2 % stroke rate/year from a score of 3  Above score calculated as 1  point each if present [CHF, HTN, DM, Vascular=MI/PAD/Aortic Plaque, Age if 65-74, or Male] Above score calculated as 2 points each if present [Age > 75, or Stroke/TIA/TE]   2. Obesity Body mass index is 29.7 kg/m. Lifestyle modification was discussed at length including regular exercise and weight reduction.  3. CAD Nonobstructive by [facility] 2007. No anginal symptoms. Continue present therapy and risk factor modification.  4. HTN Stable, no changes today.   5. AS Recent echo showed mod AS with mild AI.   Follow up in the AF clinic in one month.   [facility] [address] [place], [place] [address] [phone] 03/22/2019 10:08 AM

## 2019-03-22 NOTE — Patient Instructions (Signed)
Coumadin clinic will contact you to start coumadin  Stop Aspirin

## 2019-03-24 IMAGING — DX DG KNEE 1-2V PORT*L*
1 series · 2 of 2 positions shown · non-contrast
Comparison: No recent prior.

CLINICAL DATA: Left knee arthroplasty.

EXAM:
PORTABLE LEFT KNEE - 1-2 VIEW

[Series 1: knee · 0.14mm/px · 2 of 2 slices shown]
[im 1/2]
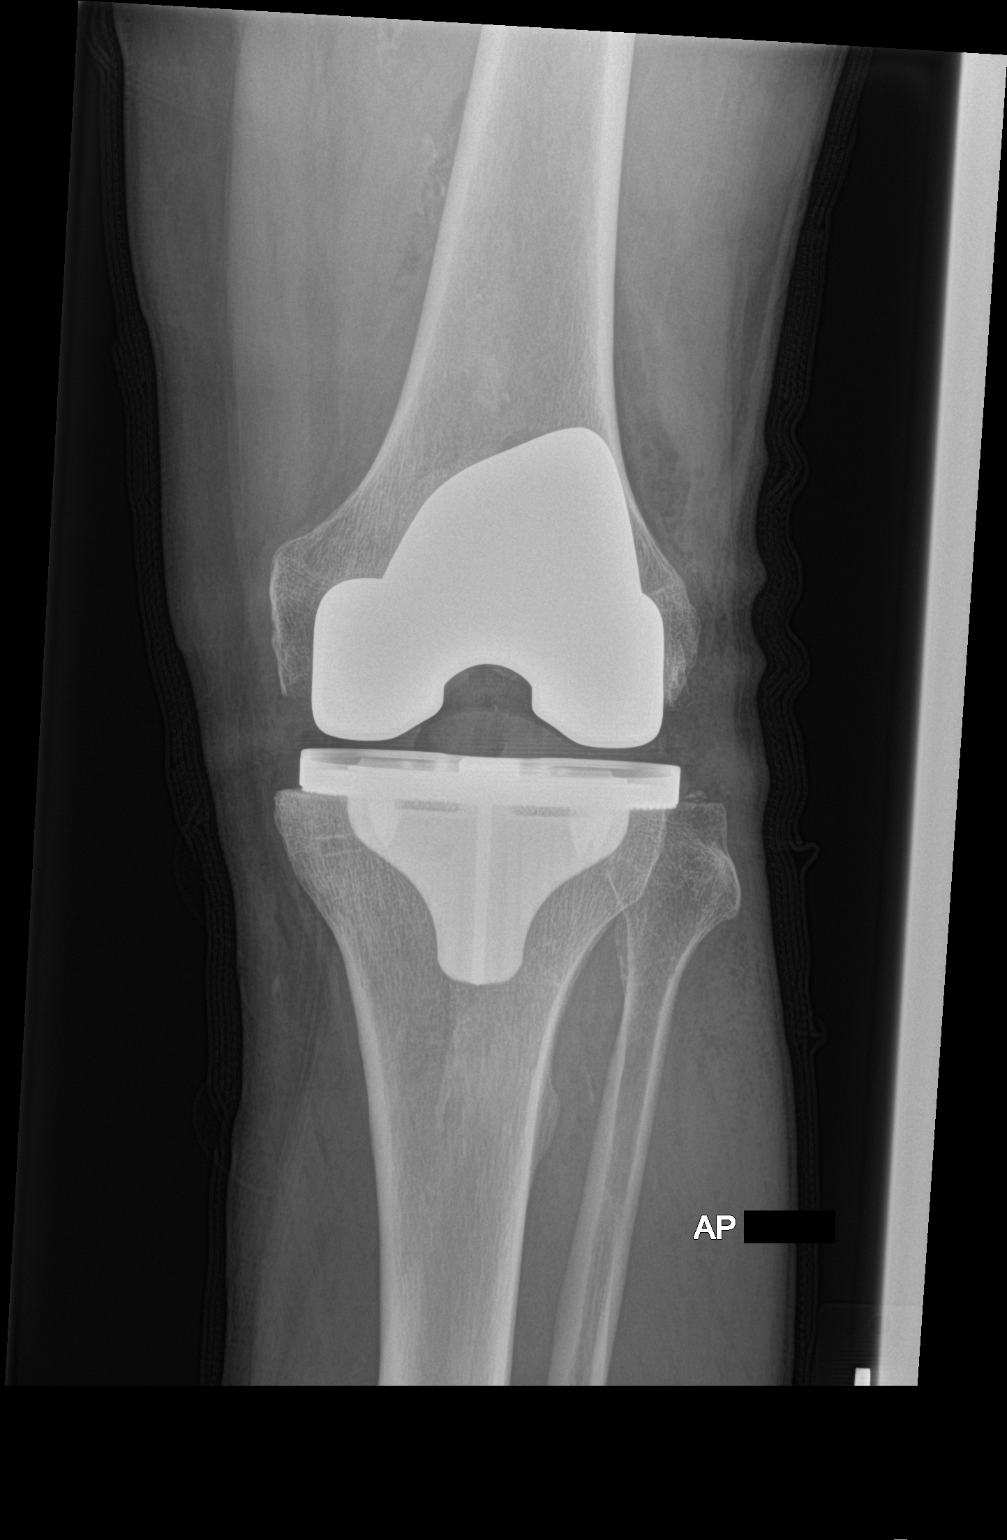
[im 2/2]
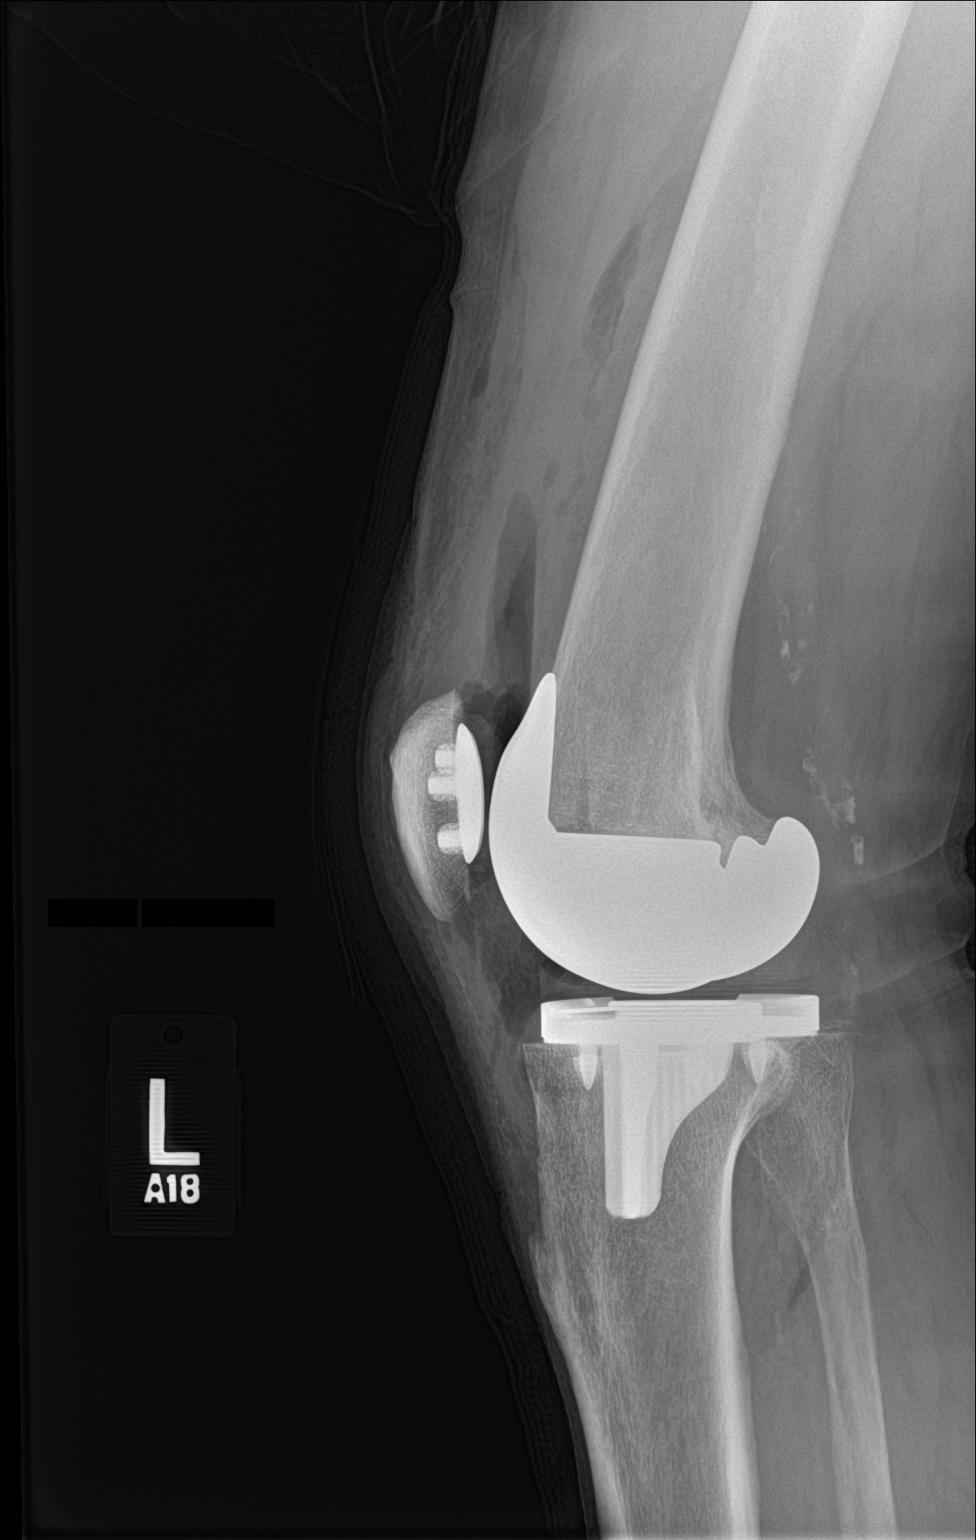

[2 of 2 positions shown; findings below may reference images not displayed]

FINDINGS: Total left knee replacement. Hardware intact. Anatomic alignment.
Peripheral vascular calcification.
IMPRESSION: Total left knee replacement with anatomic alignment.

## 2019-03-28 ENCOUNTER — Ambulatory Visit (INDEPENDENT_AMBULATORY_CARE_PROVIDER_SITE_OTHER): Payer: Medicare HMO | Admitting: *Deleted

## 2019-03-28 ENCOUNTER — Other Ambulatory Visit: Payer: Self-pay

## 2019-03-28 DIAGNOSIS — Z5181 Encounter for therapeutic drug level monitoring: Secondary | ICD-10-CM | POA: Insufficient documentation

## 2019-03-28 DIAGNOSIS — I4819 Other persistent atrial fibrillation: Secondary | ICD-10-CM | POA: Diagnosis not present

## 2019-03-28 LAB — POCT INR: INR: 2.9 (ref 2.0–3.0)

## 2019-03-28 NOTE — Patient Instructions (Addendum)
A full discussion of the nature of anticoagulants has been carried out.  A benefit risk analysis has been presented to the patient, so that they understand the justification for choosing anticoagulation at this time. The need for frequent and regular monitoring, precise dosage adjustment and compliance is stressed.  Side effects of potential bleeding are discussed.  The patient should avoid any OTC items containing aspirin or ibuprofen, and should avoid great swings in general diet.  Avoid alcohol consumption.  Call if any signs of abnormal bleeding.   Description   Start taking 1/2 a tablet daily except for 1 tablet on Monday and Fridays. Recheck INR in 1 week. Please call the coumadin clinic [phone], if you have any changes in your medication or upcoming procedures.

## 2019-04-03 ENCOUNTER — Telehealth: Payer: Self-pay | Admitting: Nurse Practitioner

## 2019-04-03 NOTE — Telephone Encounter (Signed)
   Pt called to report that he has developed a recurrent gout flare.  He has used a short course of indomethacin for this before, but is concerned about doing this again b/c he is now on coumadin.  I advised that he may go ahead with a short course of indomethacin as rx by his PCP, however, I would recommend that he start prilosec otc 20mg  daily while taking indomethacin.  Caller verbalized understanding and was grateful for the call back.  Murray Hodgkins, NP 04/03/2019, 12:18 PM

## 2019-04-06 ENCOUNTER — Ambulatory Visit (INDEPENDENT_AMBULATORY_CARE_PROVIDER_SITE_OTHER): Payer: Medicare HMO | Admitting: *Deleted

## 2019-04-06 ENCOUNTER — Other Ambulatory Visit: Payer: Self-pay

## 2019-04-06 DIAGNOSIS — I4819 Other persistent atrial fibrillation: Secondary | ICD-10-CM

## 2019-04-06 DIAGNOSIS — Z5181 Encounter for therapeutic drug level monitoring: Secondary | ICD-10-CM

## 2019-04-06 LAB — POCT INR: INR: 3 (ref 2.0–3.0)

## 2019-04-06 NOTE — Patient Instructions (Signed)
Description   Start taking 1/2 a tablet daily except for 1 tablet on Monday and Fridays. Recheck INR in 2 weeks. Please call the coumadin clinic [phone], if you have any changes in your medication or upcoming procedures.

## 2019-04-22 ENCOUNTER — Other Ambulatory Visit: Payer: Self-pay

## 2019-04-22 ENCOUNTER — Ambulatory Visit (HOSPITAL_COMMUNITY)
Admission: RE | Admit: 2019-04-22 | Discharge: 2019-04-22 | Disposition: A | Discharge status: Home / Self Care | Payer: Medicare HMO | Source: Ambulatory Visit | Attending: Physician Assistant | Admitting: Physician Assistant

## 2019-04-22 ENCOUNTER — Ambulatory Visit (INDEPENDENT_AMBULATORY_CARE_PROVIDER_SITE_OTHER): Payer: Medicare HMO | Admitting: *Deleted

## 2019-04-22 VITALS — BP 122/70 | HR 69 | Ht 66.0 in | Wt 186.4 lb

## 2019-04-22 DIAGNOSIS — D6869 Other thrombophilia: Secondary | ICD-10-CM

## 2019-04-22 DIAGNOSIS — Z6830 Body mass index (BMI) 30.0-30.9, adult: Secondary | ICD-10-CM | POA: Insufficient documentation

## 2019-04-22 DIAGNOSIS — I350 Nonrheumatic aortic (valve) stenosis: Secondary | ICD-10-CM | POA: Insufficient documentation

## 2019-04-22 DIAGNOSIS — E785 Hyperlipidemia, unspecified: Secondary | ICD-10-CM | POA: Insufficient documentation

## 2019-04-22 DIAGNOSIS — E669 Obesity, unspecified: Secondary | ICD-10-CM | POA: Insufficient documentation

## 2019-04-22 DIAGNOSIS — Z7901 Long term (current) use of anticoagulants: Secondary | ICD-10-CM | POA: Diagnosis not present

## 2019-04-22 DIAGNOSIS — Z79899 Other long term (current) drug therapy: Secondary | ICD-10-CM | POA: Insufficient documentation

## 2019-04-22 DIAGNOSIS — I2510 Atherosclerotic heart disease of native coronary artery without angina pectoris: Secondary | ICD-10-CM | POA: Insufficient documentation

## 2019-04-22 DIAGNOSIS — Z87891 Personal history of nicotine dependence: Secondary | ICD-10-CM | POA: Insufficient documentation

## 2019-04-22 DIAGNOSIS — Z96611 Presence of right artificial shoulder joint: Secondary | ICD-10-CM | POA: Insufficient documentation

## 2019-04-22 DIAGNOSIS — Z96612 Presence of left artificial shoulder joint: Secondary | ICD-10-CM | POA: Insufficient documentation

## 2019-04-22 DIAGNOSIS — Z8711 Personal history of peptic ulcer disease: Secondary | ICD-10-CM | POA: Insufficient documentation

## 2019-04-22 DIAGNOSIS — Z96652 Presence of left artificial knee joint: Secondary | ICD-10-CM | POA: Insufficient documentation

## 2019-04-22 DIAGNOSIS — I4819 Other persistent atrial fibrillation: Secondary | ICD-10-CM

## 2019-04-22 DIAGNOSIS — Z8249 Family history of ischemic heart disease and other diseases of the circulatory system: Secondary | ICD-10-CM | POA: Insufficient documentation

## 2019-04-22 DIAGNOSIS — I10 Essential (primary) hypertension: Secondary | ICD-10-CM | POA: Insufficient documentation

## 2019-04-22 DIAGNOSIS — Z5181 Encounter for therapeutic drug level monitoring: Secondary | ICD-10-CM | POA: Diagnosis not present

## 2019-04-22 DIAGNOSIS — I4891 Unspecified atrial fibrillation: Secondary | ICD-10-CM | POA: Diagnosis present

## 2019-04-22 LAB — POCT INR: INR: 2.1 (ref 2.0–3.0)

## 2019-04-22 NOTE — Patient Instructions (Signed)
Follow-Up with Afib Clinic as needed

## 2019-04-22 NOTE — Patient Instructions (Signed)
Description   Continue taking 1/2 a tablet daily except for 1 tablet on Monday and Fridays. Recheck INR in 3 weeks. Please call the coumadin clinic [phone], if you have any changes in your medication or upcoming procedures.

## 2019-04-22 NOTE — Progress Notes (Signed)
Primary Care Physician: Axel Filler, MD Primary Cardiologist: Dr Stanford Breed Primary Electrophysiologist: none Referring Physician: Dr Marlou Sa is a 73 y.o. male with a history of AS, CAD, HTN, transient NICM in the setting of ETOH abuse and afib, HLD, and persistent atrial fibrillation who presents for consultation in the [facility]. Patient was first diagnosed with afib in 2007 in the setting of NICM and ETOH abuse. He had not had any reoccurrence until 03/03/19 when his preop ECG for a hernia repair showed rate controlled afib. Patient had recent treatment for gout flare with indomethacin and Kenolog injections on 01/27/19 and 02/24/19. Patient was unaware of his arrhythmia and asymptomatic. Patient was started on Xarelto (held for hernia surgery) but unfortunately this was cost prohibitive. Patient denies any alcohol use since 2007 and denies snoring or daytime somnolence. Patient is on warfarin for CHADS2VASC score of 3.  On follow up today, patient reports that he has done well since his last visit. He has had no heart racing or palpitations or any awareness of his arrhythmia. His only complaint is occasional stuffy nose with his allergies. He remains in rate controlled afib. He has been started on warfarin and his INRs have ben therapeutic.   Today, he denies symptoms of palpitations, chest pain, shortness of breath, orthopnea, PND, lower extremity edema, dizziness, presyncope, syncope, snoring, daytime somnolence, bleeding, or neurologic sequela. The patient is tolerating medications without difficulties and is otherwise without complaint today.    Atrial Fibrillation Risk Factors:  he does not have symptoms or diagnosis of sleep apnea. he does not have a history of rheumatic fever. he does have a history of alcohol use. The patient does not have a history of early familial atrial fibrillation or other arrhythmias.  he has a BMI of  Body mass index is 30.09 kg/m.Marland Kitchen Filed Weights   04/22/19 0924  Weight: 84.6 kg    Family History  Problem Relation Age of Onset  . Coronary artery disease Father 74       Died of myocardial infarction  . Bradycardia Mother 73       Requiring pacemeaker placement  . Coronary artery disease Brother 69       Died of myocardial infarction  . Obesity Daughter   . Healthy Son      Atrial Fibrillation Management history:  Previous antiarrhythmic drugs: none Previous cardioversions: none Previous ablations: none CHADS2VASC score: 3 Anticoagulation history: warfarin, Xarelto (cost prohibitive)    Past Medical History:  Diagnosis Date  . Allergic rhinitis 08/08/2013   takes Loratadine daily   . Aortic stenosis 09/08/2011   With mild aortic regurgitation, mean gradient 13 mmHg   . Cataract    right but immature  . Complication of anesthesia    stopped breathing - during shoulder surgery 2009-2010  . Coronary artery disease 10/02/2009   Cardiac cath (October 2007): 20% left main, 60% mid LAD, 60-70% distal LAD, 30-40% first diagonal, 30% circumflex, 40% OM, 30-40% RCA   . Degenerative joint disease of shoulder 08/03/2013   Bilateral, s/p right total shoulder arthroplasty 05/29/2011 and left shoulder arthroplasty 06/08/2014  . Diastolic dysfunction [id number]   Echo (04/04/2016): Grade I  . Diverticulosis 10/07/2013   Seen on colonoscopy in 2007   . Erectile dysfunction 05/07/2009  . Essential hypertension 08/03/2013   had been on Lisinopril but stopped per MD  . First degree AV block 03/14/2016  . Gastric ulcer 10/07/2013   Seen  on EGD 04/10/2006   . Hemorrhoids 10/07/2013  . Hyperlipidemia 10/02/2009  . Paroxysmal atrial fibrillation ([place]) 07/14/2006   One episode, provoked by alcohol use disorder, briefly anticoagulated with warfarin, then switched to aspirin alone  . Sciatica associated with disorder of lumbar spine 08/03/2013   Anterolisthesis with right L 4-5 nerve root compression.   Treated with epidural injections and Physical Therapy.   . Seborrheic keratosis 10/07/2013  . Tubular adenoma of colon    Past Surgical History:  Procedure Laterality Date  . CARDIAC CATHETERIZATION  2007  . CARDIOVERSION    . COLONOSCOPY    . INSERTION OF MESH N/A 03/11/2019   Procedure: Insertion Of Mesh;  Surgeon: Ralene Ok, MD;  Location: [place];  Service: General;  Laterality: N/A;  . JOINT REPLACEMENT    . KNEE ARTHROSCOPY WITH MENISCAL REPAIR Right 01/30/2011  . right finger surgery     as a child  . TONSILLECTOMY    . TOTAL KNEE ARTHROPLASTY Left 07/28/2017   Procedure: LEFT TOTAL KNEE ARTHROPLASTY;  Surgeon: Renette Butters, MD;  Location: [place];  Service: Orthopedics;  Laterality: Left;  . TOTAL SHOULDER ARTHROPLASTY  05/29/2011   Procedure: TOTAL SHOULDER ARTHROPLASTY;  Surgeon: Metta Clines Supple;  Location: [place];  Service: Orthopedics;  Laterality: Right;  . TOTAL SHOULDER ARTHROPLASTY Left 06/08/2014   DR SUPPLE  . TOTAL SHOULDER ARTHROPLASTY Left 06/08/2014   Procedure: LEFT TOTAL SHOULDER ARTHROPLASTY;  Surgeon: Marin Shutter, MD;  Location: [place];  Service: Orthopedics;  Laterality: Left;  . UMBILICAL HERNIA REPAIR N/A 03/11/2019   Procedure: LAPAROSCOPIC UMBILICAL HERNIA;  Surgeon: Ralene Ok, MD;  Location: [place];  Service: General;  Laterality: N/A;    Current Outpatient Medications  Medication Sig Dispense Refill  . atorvastatin (LIPITOR) 40 MG tablet Take 1 tablet (40 mg total) by mouth daily. 90 tablet 3  . carvedilol (COREG) 12.5 MG tablet Take 1 tablet (12.5 mg total) by mouth 2 (two) times daily with a meal. 180 tablet 3  . warfarin (COUMADIN) 5 MG tablet Take 1 tablet (5 mg total) by mouth daily. (Patient taking differently: Take 5 mg by mouth daily. Taking one tablet on Monday and Fridays and 1/2 tablet on Tuesday, Wednesday, Thursday, Saturday and Sunday) 30 tablet 1   No current facility-administered medications for this encounter.     No  Known Allergies  Social History   Socioeconomic History  . Marital status: Married    Spouse name: Not on file  . Number of children: Not on file  . Years of education: Not on file  . Highest education level: Not on file  Occupational History  . Not on file  Social Needs  . Financial resource strain: Not on file  . Food insecurity    Worry: Not on file    Inability: Not on file  . Transportation needs    Medical: Not on file    Non-medical: Not on file  Tobacco Use  . Smoking status: Former Smoker    Types: Cigarettes    Quit date: 07/14/2010    Years since quitting: 8.7  . Smokeless tobacco: Never Used  . Tobacco comment: quit smoking about 27yrs ago  Substance and Sexual Activity  . Alcohol use: No    Frequency: Never    Comment: no alcohol since 07  . Drug use: No  . Sexual activity: Never    Birth control/protection: None  Lifestyle  . Physical activity    Days per week:  Not on file    Minutes per session: Not on file  . Stress: Not on file  Relationships  . Social Herbalist on phone: Not on file    Gets together: Not on file    Attends religious service: Not on file    Active member of club or organization: Not on file    Attends meetings of clubs or organizations: Not on file    Relationship status: Not on file  . Intimate partner violence    Fear of current or ex partner: Not on file    Emotionally abused: Not on file    Physically abused: Not on file    Forced sexual activity: Not on file  Other Topics Concern  . Not on file  [place] with wife and dog.  Former Teacher, early years/pre.     ROS- All systems are reviewed and negative except as per the HPI above.  Physical Exam: Vitals:   04/22/19 0924  BP: 122/70  Pulse: 69  Weight: 84.6 kg  Height: 5\' 6"  (1.676 m)    GEN- The patient is well appearing obese male, alert and oriented x 3 today.   HEENT-head  normocephalic, atraumatic, sclera clear, conjunctiva pink, hearing intact, trachea midline. Lungs- Clear to ausculation bilaterally, normal work of breathing Heart- irregular rate and rhythm, no rubs or gallops. 2/6 systolic murmur. GI- soft, NT, ND, + BS Extremities- no clubbing, cyanosis, or edema MS- no significant deformity or atrophy Skin- no rash or lesion Psych- euthymic mood, full affect Neuro- strength and sensation are intact   Wt Readings from Last 3 Encounters:  04/22/19 84.6 kg  03/22/19 83.5 kg  03/11/19 85.6 kg    EKG today demonstrates afib HR 69, QRS 82, QTc 426  Cardiac cath 04/09/06:  - LM: 20% stenosis - LAD: 30%proximal stenosis, 60% mid stenosis, 60-70% distal stenosis. D1 30-40% stenosis. -LCx: Multiple 30% discrete lesions in proximal and mid vessel. OM1 40% stenosis. -RCA: Dominant vessel. 30-40% mid stenosis. Diffuse distal PDA and posterior lateral disease in <1.5 mm vessels.  - LV: Global hypokinesis with an EF 25-30%. 1-2+ MR. 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 He is not a candidate forCoumadin right now.  Echo 01/31/19 demonstrated   1. The left ventricle has normal systolic function with an ejection fraction of 60-65%. The cavity size was normal. There is mildly increased left ventricular wall thickness. Left ventricular diastolic function could not be evaluated secondary to atrial  fibrillation.  2. The right ventricle has normal systolic function. The cavity was normal.  3. Left atrial size was moderately dilated.  4. The mitral valve is abnormal. There is mild mitral annular calcification present.  5. The aorta is normal in size and structure.  6. Normal LV systolic function; mild LVH; calcified aortic valve with moderate AS (mean gradient 22 mmHg) and mild AI; moderate LAE; mild MR.  Epic records are reviewed at length today  Assessment and Plan:  1.  Persistent atrial fibrillation Patient remains in rate controlled afib. We discussed therapeutic options again today. Given his age, rate control, preserved EF, and paucity of symptoms, patient would like to pursue a conservative strategy of rate control at this time. Continue warfarin for a CHADS2VASC score of 3. Continue Coreg 12.5 mg BID Lifestyle changes as below.  This patients CHA2DS2-VASc Score and unadjusted Ischemic Stroke Rate (%  per year) is equal to 3.2 % stroke rate/year from a score of 3  Above score calculated as 1 point each if present [CHF, HTN, DM, Vascular=MI/PAD/Aortic Plaque, Age if 65-74, or Male] Above score calculated as 2 points each if present [Age > 75, or Stroke/TIA/TE]   2. Obesity Body mass index is 30.09 kg/m. Lifestyle modification was discussed and encouraged including regular physical activity and weight reduction.  3. CAD Nonobstructive by [facility] 2007.  No anginal symptoms. Continue present therapy and risk factor modification.  4. HTN Stable, no changes today.  5. Aortic Stenosis Recent echo showed moderate AS with mild AI. Followed with serial echos by PCP.   Follow up in the AF clinic as needed.   [facility] [address] [place], [place] [address] [phone] 04/22/2019 10:00 AM

## 2019-04-25 ENCOUNTER — Other Ambulatory Visit: Payer: Self-pay

## 2019-04-25 ENCOUNTER — Ambulatory Visit (INDEPENDENT_AMBULATORY_CARE_PROVIDER_SITE_OTHER): Payer: Medicare HMO | Admitting: Student in an Organized Health Care Education/Training Program

## 2019-04-25 ENCOUNTER — Encounter: Payer: Self-pay | Admitting: Student in an Organized Health Care Education/Training Program

## 2019-04-25 VITALS — BP 164/70 | HR 74 | Temp 98.7°F | Wt 189.0 lb

## 2019-04-25 DIAGNOSIS — I4891 Unspecified atrial fibrillation: Secondary | ICD-10-CM

## 2019-04-25 DIAGNOSIS — Z791 Long term (current) use of non-steroidal anti-inflammatories (NSAID): Secondary | ICD-10-CM | POA: Diagnosis not present

## 2019-04-25 DIAGNOSIS — I4819 Other persistent atrial fibrillation: Secondary | ICD-10-CM | POA: Diagnosis not present

## 2019-04-25 DIAGNOSIS — Z79899 Other long term (current) drug therapy: Secondary | ICD-10-CM | POA: Diagnosis not present

## 2019-04-25 DIAGNOSIS — Z7901 Long term (current) use of anticoagulants: Secondary | ICD-10-CM | POA: Diagnosis not present

## 2019-04-25 DIAGNOSIS — M1A0720 Idiopathic chronic gout, left ankle and foot, without tophus (tophi): Secondary | ICD-10-CM

## 2019-04-25 DIAGNOSIS — K429 Umbilical hernia without obstruction or gangrene: Secondary | ICD-10-CM

## 2019-04-25 DIAGNOSIS — M109 Gout, unspecified: Secondary | ICD-10-CM | POA: Diagnosis not present

## 2019-04-25 DIAGNOSIS — Z8719 Personal history of other diseases of the digestive system: Secondary | ICD-10-CM | POA: Diagnosis not present

## 2019-04-25 DIAGNOSIS — Z9889 Other specified postprocedural states: Secondary | ICD-10-CM | POA: Diagnosis not present

## 2019-04-25 MED ORDER — WARFARIN SODIUM 5 MG PO TABS: ORAL_TABLET | ORAL | 0 refills | Status: DC

## 2019-04-25 MED ORDER — ALLOPURINOL 100 MG PO TABS: 100.0000 mg | ORAL_TABLET | Freq: Every day | ORAL | 3 refills | Status: DC

## 2019-04-25 MED ORDER — COLCHICINE 0.6 MG PO TABS: 0.6000 mg | ORAL_TABLET | Freq: Every day | ORAL | 0 refills | Status: DC

## 2019-04-25 NOTE — Assessment & Plan Note (Signed)
Doing well right now, rate is well controlled on carvedilol and his functional status is excellent. He has consulted with the a fib clinic, now anticoagulated with coumadin, INR monitoring through [facility]. I am going to decrease coumadin dose today by 10% because we are starting allopurinol which can increase coumadin effect. Plan will be for decreasing from 22.5mg  weekly to 20mg  weekly (5mg  Monday, 2.5mg  every other day).

## 2019-04-25 NOTE — Progress Notes (Signed)
   Assessment and Plan:  See Encounters tab for problem-based medical decision making.   __________________________________________________________  HPI:   73 year old man her for follow up of gout. He is doing well. It has been a busy few months, he had an abdominal hernia repair which went well. He was found to have persistent a fib during that operation. Now is anticoagulated for stroke prophylaxis and is doing well with no bleeding events. He has had two more gout flares since I last saw him, one in the left great toe and one in the right ankle. They both resolved over several days with NSAIDs, however he finds those flares to be very disabling. He really isn't able to walk during these flares.   No recent fevers or chills, no chest pain or shortness of breath. Doing well at home, independent in all ADLs.  __________________________________________________________  Problem List: Patient Active Problem List   Diagnosis Date Noted  . Atrial fibrillation ([place]) 03/04/2019    Priority: High  . Essential hypertension 08/03/2013    Priority: High  . Coronary artery disease of native artery of native heart with stable angina pectoris ([place]) 10/02/2009    Priority: High  . Prediabetes 01/24/2019    Priority: Medium  . Aortic stenosis 09/08/2011    Priority: Medium  . Hyperlipidemia 10/02/2009    Priority: Medium  . Flat foot, acquired, left 01/24/2019    Priority: Low  . Gout 01/24/2019    Priority: Low  . Osteoarthritis of both knees 12/12/2014    Priority: Low  . Tubular adenoma 10/07/2013    Priority: Low  . Seborrheic keratosis 10/07/2013    Priority: Low  . Healthcare maintenance 10/07/2013    Priority: Low  . Sciatica associated with disorder of lumbar spine 08/03/2013    Priority: Low  . Acquired thrombophilia ([place]) 04/22/2019  . Encounter for therapeutic drug monitoring 03/28/2019    Medications: Reconciled today in  Epic __________________________________________________________  Physical Exam:  Vital Signs: Vitals:   04/25/19 1047  BP: (!) 164/70  Pulse: 74  SpO2: 98%    Gen: Well appearing, NAD Abd: Soft, NT, ND. Well healed surgical incision, hernia is resolved.  Ext: Warm, no edema, normal joints Skin: No atypical appearing moles. No rashes

## 2019-04-25 NOTE — Patient Instructions (Signed)
Is a pleasure seeing you today.  Glad you have recovered well from your hernia surgery.  We talked about your gout today.  We decided to start a prophylactic medication to prevent future gout flares.  We are going to start a medicine called allopurinol 100 mg once daily.  This will be indefinite medication to prevent future flares.  For the next 3 months I want you to also use colchicine 0.6 mg once daily to prevent flares, this medicine can be discontinued after 3 months of use.  Allopurinol will make your Coumadin levels higher slightly.  I want you to decrease your Coumadin dose to 5 mg on Monday, and 2.5 mg every other day of the week.

## 2019-04-25 NOTE — Assessment & Plan Note (Signed)
Three flares of gout over the last three months. We talked about the role of urate lowering medications to prophylax against future flares. He finds these gout flares to be very disabling and is interested in prophylaxis. Plan is to start Allopurinol 100mg  daily, we discussed side effects, follow up with uric acid levels and goal < 6. Will also start colchicine 0.6mg  daily for 3 months to prevent flares while starting urate lowering therapy.

## 2019-04-25 NOTE — Assessment & Plan Note (Signed)
Resolved with surgical management. Has recovered well.

## 2019-04-29 ENCOUNTER — Telehealth: Payer: Self-pay | Admitting: *Deleted

## 2019-04-29 NOTE — Telephone Encounter (Signed)
Fax received from [facility] stating they received a Rx for colchicine 0.6mg  and carvedilol 12.5 mg. "These drugs may cause toxicity. Please confirm that you are aware of this interaction and ok to fill this combination." L. Dontee Jaso, BSN, RN-BC

## 2019-05-02 NOTE — Telephone Encounter (Signed)
Yes, that combination will be fine.

## 2019-05-02 NOTE — Telephone Encounter (Signed)
This response has been faxed back to [facility]. Hubbard Hartshorn, BSN, RN-BC

## 2019-05-13 ENCOUNTER — Other Ambulatory Visit: Payer: Self-pay

## 2019-05-13 ENCOUNTER — Ambulatory Visit (INDEPENDENT_AMBULATORY_CARE_PROVIDER_SITE_OTHER): Payer: Medicare HMO | Admitting: *Deleted

## 2019-05-13 DIAGNOSIS — Z5181 Encounter for therapeutic drug level monitoring: Secondary | ICD-10-CM | POA: Diagnosis not present

## 2019-05-13 DIAGNOSIS — I4819 Other persistent atrial fibrillation: Secondary | ICD-10-CM | POA: Diagnosis not present

## 2019-05-13 LAB — POCT INR: INR: 1.9 — AB (ref 2.0–3.0)

## 2019-05-13 NOTE — Patient Instructions (Addendum)
Description   Take an extra 1/2 tablet today, then continue taking 1/2 a tablet daily except for 1 tablet on Monday and Fridays. Recheck INR in 3 weeks. Please call the coumadin clinic [phone], if you have any changes in your medication or upcoming procedures.

## 2019-05-19 DIAGNOSIS — Z20828 Contact with and (suspected) exposure to other viral communicable diseases: Secondary | ICD-10-CM | POA: Diagnosis not present

## 2019-05-19 DIAGNOSIS — J3489 Other specified disorders of nose and nasal sinuses: Secondary | ICD-10-CM | POA: Diagnosis not present

## 2019-06-01 NOTE — Progress Notes (Signed)
HPI: FU NICM improved by most recent echocardiogram. He also has a history of coronary artery disease with cardiomyopathy out of proportion, alcohol use, now resolved and paroxysmal atrial fibrillation holding sinus rhythm. Cardiac catheterization in October of 2007 showed a 20% left main, a 60% mid LAD and a 60-70% distal LAD. There was a 30-40% first diagonal. There was a 30% circumflex and a 40% OM. There was a 30-40% right coronary artery. Ejection fraction was 25-30%. Abdominal ultrasound in February 2012 showed no aneurysm. ABIs October 2017 normal. Last echocardiogram August 2020 showed normal LV function, mild left ventricular hypertrophy, moderate aortic stenosis with mean gradient 22 mmHg, mild aortic insufficiency, mild mitral regurgitation and moderate left atrial enlargement. Patient found to have recurrent atrial fibrillation September 2020 that was asymptomatic. Since he was last seen, he has some dyspnea on exertion but no orthopnea, PND, pedal edema, chest pain or syncope.  Some fatigue.  Current Outpatient Medications  Medication Sig Dispense Refill  . allopurinol (ZYLOPRIM) 100 MG tablet Take 1 tablet (100 mg total) by mouth daily. 90 tablet 3  . atorvastatin (LIPITOR) 40 MG tablet Take 1 tablet (40 mg total) by mouth daily. 90 tablet 3  . carvedilol (COREG) 12.5 MG tablet Take 1 tablet (12.5 mg total) by mouth 2 (two) times daily with a meal. 180 tablet 3  . warfarin (COUMADIN) 5 MG tablet Take as directed by the coumadin clinic 75 tablet 0   No current facility-administered medications for this visit.     Past Medical History:  Diagnosis Date  . Allergic rhinitis 08/08/2013   takes Loratadine daily   . Aortic stenosis 09/08/2011   With mild aortic regurgitation, mean gradient 13 mmHg   . Cataract    right but immature  . Complication of anesthesia    stopped breathing - during shoulder surgery 2009-2010  . Coronary artery disease 10/02/2009   Cardiac cath (October  2007): 20% left main, 60% mid LAD, 60-70% distal LAD, 30-40% first diagonal, 30% circumflex, 40% OM, 30-40% RCA   . Degenerative joint disease of shoulder 08/03/2013   Bilateral, s/p right total shoulder arthroplasty 05/29/2011 and left shoulder arthroplasty 06/08/2014  . Diastolic dysfunction A999333   Echo (04/04/2016): Grade I  . Diverticulosis 10/07/2013   Seen on colonoscopy in 2007   . Erectile dysfunction 05/07/2009  . Essential hypertension 08/03/2013   had been on Lisinopril but stopped per MD  . First degree AV block 03/14/2016  . Gastric ulcer 10/07/2013   Seen on EGD 04/10/2006   . Hemorrhoids 10/07/2013  . Hyperlipidemia 10/02/2009  . Paroxysmal atrial fibrillation ([place]) 07/14/2006   One episode, provoked by alcohol use disorder, briefly anticoagulated with warfarin, then switched to aspirin alone  . Sciatica associated with disorder of lumbar spine 08/03/2013   Anterolisthesis with right L 4-5 nerve root compression.  Treated with epidural injections and Physical Therapy.   . Seborrheic keratosis 10/07/2013  . Tubular adenoma of colon     Past Surgical History:  Procedure Laterality Date  . CARDIAC CATHETERIZATION  2007  . CARDIOVERSION    . COLONOSCOPY    . INSERTION OF MESH N/A 03/11/2019   Procedure: Insertion Of Mesh;  Surgeon: Ralene Ok, MD;  Location: [place];  Service: General;  Laterality: N/A;  . JOINT REPLACEMENT    . KNEE ARTHROSCOPY WITH MENISCAL REPAIR Right 01/30/2011  . right finger surgery     as a child  . TONSILLECTOMY    . TOTAL  KNEE ARTHROPLASTY Left 07/28/2017   Procedure: LEFT TOTAL KNEE ARTHROPLASTY;  Surgeon: Renette Butters, MD;  Location: [place];  Service: Orthopedics;  Laterality: Left;  . TOTAL SHOULDER ARTHROPLASTY  05/29/2011   Procedure: TOTAL SHOULDER ARTHROPLASTY;  Surgeon: Metta Clines Supple;  Location: [place];  Service: Orthopedics;  Laterality: Right;  . TOTAL SHOULDER ARTHROPLASTY Left 06/08/2014   DR SUPPLE  . TOTAL SHOULDER ARTHROPLASTY  Left 06/08/2014   Procedure: LEFT TOTAL SHOULDER ARTHROPLASTY;  Surgeon: Marin Shutter, MD;  Location: [place];  Service: Orthopedics;  Laterality: Left;  . UMBILICAL HERNIA REPAIR N/A 03/11/2019   Procedure: LAPAROSCOPIC UMBILICAL HERNIA;  Surgeon: Ralene Ok, MD;  Location: [place];  Service: General;  Laterality: N/A;    Social History   Socioeconomic History  . Marital status: Married    Spouse name: Not on file  . Number of children: Not on file  . Years of education: Not on file  . Highest education level: Not on file  Occupational History  . Not on file  Tobacco Use  . Smoking status: Former Smoker    Types: Cigarettes    Quit date: 07/14/2010    Years since quitting: 8.9  . Smokeless tobacco: Never Used  . Tobacco comment: quit smoking about 58yrs ago  Substance and Sexual Activity  . Alcohol use: No    Comment: no alcohol since 07  . Drug use: No  . Sexual activity: Never    Birth control/protection: None  Other Topics Concern  . Not on file  [place] with wife and dog.  Former Teacher, early years/pre.   Social Determinants of Health   Financial Resource Strain:   . Difficulty of Paying Living Expenses: Not on file  Food Insecurity:   . Worried About Charity fundraiser in the Last Year: Not on file  . Ran Out of Food in the Last Year: Not on file  Transportation Needs:   . Lack of Transportation (Medical): Not on file  . Lack of Transportation (Non-Medical): Not on file  Physical Activity:   . Days of Exercise per Week: Not on file  . Minutes of Exercise per Session: Not on file  Stress:   . Feeling of Stress : Not on file  Social Connections:   . Frequency of Communication with Friends and Family: Not on file  . Frequency of Social Gatherings with Friends and Family: Not on file  . Attends Religious Services: Not on file  . Active Member of Clubs or Organizations: Not on file  . Attends  Archivist Meetings: Not on file  . Marital Status: Not on file  Intimate Partner Violence:   . Fear of Current or Ex-Partner: Not on file  . Emotionally Abused: Not on file  . Physically Abused: Not on file  . Sexually Abused: Not on file    Family History  Problem Relation Age of Onset  . Coronary artery disease Father 65       Died of myocardial infarction  . Bradycardia Mother 60       Requiring pacemeaker placement  . Coronary artery disease Brother 53       Died of myocardial infarction  . Obesity Daughter   . Healthy Son     ROS: no fevers or chills, productive cough, hemoptysis, dysphasia, odynophagia, melena, hematochezia, dysuria, hematuria, rash, seizure activity, orthopnea, PND, pedal edema, claudication. Remaining systems are negative.  Physical  Exam: Well-developed well-nourished in no acute distress.  Skin is warm and dry.  HEENT is normal.  Neck is supple.  Chest is clear to auscultation with normal expansion.  Cardiovascular exam is irregular.  3/6 systolic murmur left sternal border. Abdominal exam nontender or distended. No masses palpated. Extremities show no edema. neuro grossly intact  A/P  1 aortic stenosis-patient will need follow-up echocardiogram August 2021.  2 coronary artery disease-continue statin.  No aspirin given need for Coumadin.  3 hypertension-blood pressure is elevated.  Add losartan 50 mg daily.  Check potassium and renal function in 1 week.  4 hyperlipidemia-continue statin.  5 history of cardiomyopathy-LV function has improved on most recent echocardiogram.  Continue beta-blocker; add ARB for blood pressure.  6 history of paroxysmal atrial fibrillation-patient remains in atrial fibrillation on examination today.  He notes some increased dyspnea on exertion and fatigue.  He would like to see if reestablishing sinus rhythm would improve his symptoms.  Continue Coumadin (he does not want DOAC's due to expense).  Once INR  therapeutic for 3 consecutive weeks we will plan to schedule cardioversion.  Continue beta-blocker.  Kirk Ruths, MD

## 2019-06-02 ENCOUNTER — Other Ambulatory Visit: Payer: Self-pay

## 2019-06-02 ENCOUNTER — Ambulatory Visit (INDEPENDENT_AMBULATORY_CARE_PROVIDER_SITE_OTHER): Payer: Medicare HMO | Admitting: *Deleted

## 2019-06-02 DIAGNOSIS — Z5181 Encounter for therapeutic drug level monitoring: Secondary | ICD-10-CM | POA: Diagnosis not present

## 2019-06-02 DIAGNOSIS — I4819 Other persistent atrial fibrillation: Secondary | ICD-10-CM

## 2019-06-02 LAB — POCT INR: INR: 2 (ref 2.0–3.0)

## 2019-06-02 MED ORDER — WARFARIN SODIUM 5 MG PO TABS: ORAL_TABLET | ORAL | 0 refills | Status: DC

## 2019-06-02 NOTE — Patient Instructions (Signed)
Description   Continue taking 1/2 a tablet daily except for 1 tablet on Monday and Fridays. Recheck INR in 4 weeks. Please call the coumadin clinic [phone], if you have any changes in your medication or upcoming procedures.

## 2019-06-10 ENCOUNTER — Ambulatory Visit (INDEPENDENT_AMBULATORY_CARE_PROVIDER_SITE_OTHER): Payer: Medicare HMO | Admitting: Pharmacist Clinician (PhC)/ Clinical Pharmacy Specialist

## 2019-06-10 ENCOUNTER — Other Ambulatory Visit: Payer: Self-pay | Admitting: Cardiology

## 2019-06-10 ENCOUNTER — Other Ambulatory Visit: Payer: Self-pay | Admitting: Pharmacist Clinician (PhC)/ Clinical Pharmacy Specialist

## 2019-06-10 ENCOUNTER — Encounter: Payer: Self-pay | Admitting: Cardiology

## 2019-06-10 ENCOUNTER — Other Ambulatory Visit: Payer: Self-pay

## 2019-06-10 ENCOUNTER — Ambulatory Visit (INDEPENDENT_AMBULATORY_CARE_PROVIDER_SITE_OTHER): Payer: Medicare HMO | Admitting: Cardiology

## 2019-06-10 VITALS — BP 150/72 | HR 59 | Ht 66.0 in | Wt 191.4 lb

## 2019-06-10 DIAGNOSIS — Z5181 Encounter for therapeutic drug level monitoring: Secondary | ICD-10-CM

## 2019-06-10 DIAGNOSIS — I4819 Other persistent atrial fibrillation: Secondary | ICD-10-CM

## 2019-06-10 DIAGNOSIS — I10 Essential (primary) hypertension: Secondary | ICD-10-CM

## 2019-06-10 DIAGNOSIS — I350 Nonrheumatic aortic (valve) stenosis: Secondary | ICD-10-CM

## 2019-06-10 LAB — POCT INR: INR: 1.7 — AB (ref 2.0–3.0)

## 2019-06-10 MED ORDER — LOSARTAN POTASSIUM 50 MG PO TABS: 50.0000 mg | ORAL_TABLET | Freq: Every day | ORAL | 3 refills | Status: DC

## 2019-06-10 MED ORDER — WARFARIN SODIUM 5 MG PO TABS: ORAL_TABLET | ORAL | 1 refills | Status: DC

## 2019-06-10 NOTE — Patient Instructions (Signed)
Medication Instructions:  START LOSARTAN 50 MG ONCE DAILY  *If you need a refill on your cardiac medications before your next appointment, please call your pharmacy*  Lab Work: Your physician recommends that you return for lab work in: [place]  If you have labs (blood work) drawn today and your tests are completely normal, you will receive your results only by: Marland Kitchen MyChart Message (if you have MyChart) OR . A paper copy in the mail If you have any lab test that is abnormal or we need to change your treatment, we will call you to review the results.  Follow-Up: At [facility], you and your health needs are our priority.  As part of our continuing mission to provide you with exceptional heart care, we have created designated Provider Care Teams.  These Care Teams include your primary Cardiologist (physician) and Advanced Practice Providers (APPs -  Physician Assistants and Nurse Practitioners) who all work together to provide you with the care you need, when you need it.  Your next appointment:   3 month(s)  The format for your next appointment:   In Person  Provider:   Kirk Ruths, MD

## 2019-06-16 ENCOUNTER — Other Ambulatory Visit: Payer: Self-pay

## 2019-06-16 ENCOUNTER — Ambulatory Visit (INDEPENDENT_AMBULATORY_CARE_PROVIDER_SITE_OTHER): Payer: Medicare HMO | Admitting: *Deleted

## 2019-06-16 DIAGNOSIS — Z5181 Encounter for therapeutic drug level monitoring: Secondary | ICD-10-CM | POA: Diagnosis not present

## 2019-06-16 DIAGNOSIS — I4819 Other persistent atrial fibrillation: Secondary | ICD-10-CM | POA: Diagnosis not present

## 2019-06-16 LAB — POCT INR: INR: 1.9 — AB (ref 2.0–3.0)

## 2019-06-16 NOTE — Patient Instructions (Signed)
Description   Take an extra 1/2 tablet today, then continue to 1/2 tablet daily except 1 tablet each Monday, Wednesday and Friday.  Repeat INR weekly for DCCV.  Please call the coumadin clinic [phone], if you have any changes in your medication or upcoming procedures.

## 2019-06-22 ENCOUNTER — Other Ambulatory Visit: Payer: Self-pay

## 2019-06-22 ENCOUNTER — Ambulatory Visit (INDEPENDENT_AMBULATORY_CARE_PROVIDER_SITE_OTHER): Payer: Medicare HMO | Admitting: *Deleted

## 2019-06-22 DIAGNOSIS — Z5181 Encounter for therapeutic drug level monitoring: Secondary | ICD-10-CM

## 2019-06-22 DIAGNOSIS — I4819 Other persistent atrial fibrillation: Secondary | ICD-10-CM

## 2019-06-22 LAB — POCT INR: INR: 2.8 (ref 2.0–3.0)

## 2019-06-22 NOTE — Patient Instructions (Addendum)
Description   Continue taking 1/2 tablet daily except 1 tablet each Monday, Wednesday and Friday. Repeat INR weekly for DCCV. Please call the coumadin clinic [phone], if you have any changes in your medication or upcoming procedures.

## 2019-06-28 ENCOUNTER — Other Ambulatory Visit: Payer: Self-pay

## 2019-06-28 ENCOUNTER — Ambulatory Visit (INDEPENDENT_AMBULATORY_CARE_PROVIDER_SITE_OTHER): Payer: Medicare HMO | Admitting: *Deleted

## 2019-06-28 DIAGNOSIS — Z5181 Encounter for therapeutic drug level monitoring: Secondary | ICD-10-CM

## 2019-06-28 DIAGNOSIS — I4819 Other persistent atrial fibrillation: Secondary | ICD-10-CM | POA: Diagnosis not present

## 2019-06-28 LAB — POCT INR: INR: 2.7 (ref 2.0–3.0)

## 2019-06-28 NOTE — Patient Instructions (Signed)
Description   Continue taking 1/2 tablet daily except 1 tablet each Monday, Wednesday and Friday. Repeat INR weekly for DCCV. Please call the coumadin clinic [phone], if you have any changes in your medication or upcoming procedures.

## 2019-07-06 ENCOUNTER — Ambulatory Visit (INDEPENDENT_AMBULATORY_CARE_PROVIDER_SITE_OTHER): Payer: Medicare HMO | Admitting: *Deleted

## 2019-07-06 ENCOUNTER — Other Ambulatory Visit: Payer: Self-pay

## 2019-07-06 DIAGNOSIS — I4819 Other persistent atrial fibrillation: Secondary | ICD-10-CM

## 2019-07-06 DIAGNOSIS — Z5181 Encounter for therapeutic drug level monitoring: Secondary | ICD-10-CM

## 2019-07-06 LAB — POCT INR: INR: 1.7 — AB (ref 2.0–3.0)

## 2019-07-06 NOTE — Patient Instructions (Signed)
Description   Today take another 1/2 tablet then continue taking 1/2 tablet daily except 1 tablet each Monday, Wednesday and Friday. Repeat INR weekly for DCCV. Please call the coumadin clinic [phone], if you have any changes in your medication or upcoming procedures.

## 2019-07-13 ENCOUNTER — Other Ambulatory Visit: Payer: Self-pay

## 2019-07-13 ENCOUNTER — Ambulatory Visit (INDEPENDENT_AMBULATORY_CARE_PROVIDER_SITE_OTHER): Payer: Medicare HMO

## 2019-07-13 DIAGNOSIS — Z5181 Encounter for therapeutic drug level monitoring: Secondary | ICD-10-CM | POA: Diagnosis not present

## 2019-07-13 DIAGNOSIS — I4819 Other persistent atrial fibrillation: Secondary | ICD-10-CM | POA: Diagnosis not present

## 2019-07-13 LAB — POCT INR: INR: 2.7 (ref 2.0–3.0)

## 2019-07-13 NOTE — Patient Instructions (Signed)
Description   Continue on same dosage 1/2 tablet daily except 1 tablet on Mondays, Wednesdays and Fridays. Repeat INR weekly for DCCV. Please call the coumadin clinic [phone], if you have any changes in your medication or upcoming procedures.

## 2019-07-19 ENCOUNTER — Other Ambulatory Visit: Payer: Self-pay | Admitting: *Deleted

## 2019-07-19 ENCOUNTER — Encounter: Payer: Self-pay | Admitting: *Deleted

## 2019-07-19 MED ORDER — WARFARIN SODIUM 5 MG PO TABS: ORAL_TABLET | ORAL | 1 refills | Status: DC

## 2019-07-19 NOTE — Telephone Encounter (Signed)
This encounter was created in error - please disregard.

## 2019-07-20 ENCOUNTER — Ambulatory Visit (INDEPENDENT_AMBULATORY_CARE_PROVIDER_SITE_OTHER): Payer: Medicare HMO | Admitting: *Deleted

## 2019-07-20 ENCOUNTER — Other Ambulatory Visit: Payer: Self-pay

## 2019-07-20 DIAGNOSIS — Z5181 Encounter for therapeutic drug level monitoring: Secondary | ICD-10-CM

## 2019-07-20 DIAGNOSIS — I4819 Other persistent atrial fibrillation: Secondary | ICD-10-CM | POA: Diagnosis not present

## 2019-07-20 LAB — POCT INR: INR: 2.2 (ref 2.0–3.0)

## 2019-07-20 NOTE — Patient Instructions (Signed)
Description   Continue on same dosage 1/2 tablet daily except 1 tablet on Mondays, Wednesdays and Fridays. Repeat INR weekly for DCCV. Please call the coumadin clinic [phone], if you have any changes in your medication or upcoming procedures.

## 2019-07-27 ENCOUNTER — Other Ambulatory Visit: Payer: Self-pay

## 2019-07-27 ENCOUNTER — Ambulatory Visit (INDEPENDENT_AMBULATORY_CARE_PROVIDER_SITE_OTHER): Payer: Medicare HMO

## 2019-07-27 DIAGNOSIS — I4819 Other persistent atrial fibrillation: Secondary | ICD-10-CM

## 2019-07-27 DIAGNOSIS — Z5181 Encounter for therapeutic drug level monitoring: Secondary | ICD-10-CM

## 2019-07-27 LAB — POCT INR: INR: 2.7 (ref 2.0–3.0)

## 2019-07-27 NOTE — Patient Instructions (Signed)
Description   Continue on same dosage 1/2 tablet daily except 1 tablet on Mondays, Wednesdays and Fridays. Repeat INR weekly for DCCV. Please call the coumadin clinic [phone], if you have any changes in your medication or upcoming procedures.

## 2019-07-29 ENCOUNTER — Other Ambulatory Visit: Payer: Self-pay | Admitting: *Deleted

## 2019-07-29 ENCOUNTER — Inpatient Hospital Stay (HOSPITAL_COMMUNITY): Admission: RE | Admit: 2019-07-29 | Payer: Medicare HMO | Source: Ambulatory Visit

## 2019-07-29 ENCOUNTER — Telehealth: Payer: Self-pay | Admitting: *Deleted

## 2019-07-29 NOTE — Telephone Encounter (Signed)
-----   Message from Brynda Peon, RN sent at 07/27/2019  9:15 AM EST ----- Suella Broad, Mr. Mcguffie has had 3 weekly INR checks above 2.0.  He wanted me to let you know, he said [name] wanted 3 weekly INRs. Doroteo Bradford

## 2019-07-29 NOTE — Telephone Encounter (Signed)
Spoke with pt, instructions discussed in detail with the patient and he voiced understanding.

## 2019-07-29 NOTE — Telephone Encounter (Signed)
Left message for pt to call, DCCV scheduled for Thursday 08/04/2019 at 10:30 am with dr Harrington Challenger. covid testing will be Monday 08/01/2019 @ 1:15 pm. He will have a protime checked on 08/03/19 as scheduled.

## 2019-08-01 ENCOUNTER — Other Ambulatory Visit (HOSPITAL_COMMUNITY)
Admission: RE | Admit: 2019-08-01 | Discharge: 2019-08-01 | Disposition: A | Discharge status: Home / Self Care | Payer: Medicare HMO | Source: Ambulatory Visit | Attending: Cardiology | Admitting: Cardiology

## 2019-08-01 DIAGNOSIS — Z01812 Encounter for preprocedural laboratory examination: Secondary | ICD-10-CM | POA: Insufficient documentation

## 2019-08-01 DIAGNOSIS — Z20822 Contact with and (suspected) exposure to covid-19: Secondary | ICD-10-CM | POA: Diagnosis not present

## 2019-08-01 LAB — SARS CORONAVIRUS 2 (TAT 6-24 HRS): SARS Coronavirus 2: NEGATIVE

## 2019-08-03 ENCOUNTER — Other Ambulatory Visit: Payer: Self-pay

## 2019-08-03 ENCOUNTER — Ambulatory Visit (INDEPENDENT_AMBULATORY_CARE_PROVIDER_SITE_OTHER): Payer: Medicare HMO | Admitting: *Deleted

## 2019-08-03 DIAGNOSIS — I4819 Other persistent atrial fibrillation: Secondary | ICD-10-CM | POA: Diagnosis not present

## 2019-08-03 DIAGNOSIS — Z5181 Encounter for therapeutic drug level monitoring: Secondary | ICD-10-CM | POA: Diagnosis not present

## 2019-08-03 LAB — POCT INR: INR: 3.2 — AB (ref 2.0–3.0)

## 2019-08-03 NOTE — Patient Instructions (Signed)
Description   Continue on same dosage 1/2 tablet daily except 1 tablet on Mondays, Wednesdays and Fridays. Repeat INR 1 week post DCCV. Please call the coumadin clinic [phone], if you have any changes in your medication or upcoming procedures.

## 2019-08-04 ENCOUNTER — Ambulatory Visit (HOSPITAL_COMMUNITY): Payer: Medicare HMO | Admitting: Certified Registered Nurse Anesthetist

## 2019-08-04 ENCOUNTER — Encounter (HOSPITAL_COMMUNITY)
Admission: RE | Disposition: A | Discharge status: Home / Self Care | Payer: Medicare HMO | Source: Home / Self Care | Attending: Internal Medicine

## 2019-08-04 ENCOUNTER — Encounter (HOSPITAL_COMMUNITY): Payer: Self-pay | Admitting: Internal Medicine

## 2019-08-04 ENCOUNTER — Ambulatory Visit (HOSPITAL_COMMUNITY)
Admission: RE | Admit: 2019-08-04 | Discharge: 2019-08-04 | Disposition: A | Discharge status: Home / Self Care | Payer: Medicare HMO | Attending: Internal Medicine | Admitting: Internal Medicine

## 2019-08-04 DIAGNOSIS — Z79899 Other long term (current) drug therapy: Secondary | ICD-10-CM | POA: Insufficient documentation

## 2019-08-04 DIAGNOSIS — E785 Hyperlipidemia, unspecified: Secondary | ICD-10-CM | POA: Insufficient documentation

## 2019-08-04 DIAGNOSIS — I429 Cardiomyopathy, unspecified: Secondary | ICD-10-CM | POA: Insufficient documentation

## 2019-08-04 DIAGNOSIS — I25118 Atherosclerotic heart disease of native coronary artery with other forms of angina pectoris: Secondary | ICD-10-CM | POA: Diagnosis not present

## 2019-08-04 DIAGNOSIS — I4819 Other persistent atrial fibrillation: Secondary | ICD-10-CM | POA: Diagnosis not present

## 2019-08-04 DIAGNOSIS — Z7901 Long term (current) use of anticoagulants: Secondary | ICD-10-CM | POA: Diagnosis not present

## 2019-08-04 DIAGNOSIS — I4891 Unspecified atrial fibrillation: Secondary | ICD-10-CM

## 2019-08-04 DIAGNOSIS — I2510 Atherosclerotic heart disease of native coronary artery without angina pectoris: Secondary | ICD-10-CM | POA: Diagnosis not present

## 2019-08-04 DIAGNOSIS — I10 Essential (primary) hypertension: Secondary | ICD-10-CM | POA: Insufficient documentation

## 2019-08-04 DIAGNOSIS — I350 Nonrheumatic aortic (valve) stenosis: Secondary | ICD-10-CM | POA: Insufficient documentation

## 2019-08-04 DIAGNOSIS — Z87891 Personal history of nicotine dependence: Secondary | ICD-10-CM | POA: Insufficient documentation

## 2019-08-04 HISTORY — PX: CARDIOVERSION: SHX1299

## 2019-08-04 LAB — POCT I-STAT, CHEM 8
BUN: 31 mg/dL — ABNORMAL HIGH (ref 8–23)
Calcium, Ion: 1.13 mmol/L — ABNORMAL LOW (ref 1.15–1.40)
Chloride: 107 mmol/L (ref 98–111)
Creatinine, Ser: 1.1 mg/dL (ref 0.61–1.24)
Glucose, Bld: 115 mg/dL — ABNORMAL HIGH (ref 70–99)
HCT: 46 % (ref 39.0–52.0)
Hemoglobin: 15.6 g/dL (ref 13.0–17.0)
Potassium: 5.1 mmol/L (ref 3.5–5.1)
Sodium: 140 mmol/L (ref 135–145)
TCO2: 26 mmol/L (ref 22–32)

## 2019-08-04 SURGERY — CARDIOVERSION
Anesthesia: General

## 2019-08-04 MED ORDER — LIDOCAINE 2% (20 MG/ML) 5 ML SYRINGE
INTRAMUSCULAR | Status: DC | PRN
  Administered 2019-08-04: 100 mg via INTRAVENOUS

## 2019-08-04 MED ORDER — PROPOFOL 10 MG/ML IV BOLUS
INTRAVENOUS | Status: DC | PRN
  Administered 2019-08-04: 80 mg via INTRAVENOUS

## 2019-08-04 MED ORDER — PHENYLEPHRINE 40 MCG/ML (10ML) SYRINGE FOR IV PUSH (FOR BLOOD PRESSURE SUPPORT)
PREFILLED_SYRINGE | INTRAVENOUS | Status: DC | PRN
  Administered 2019-08-04: 80 ug via INTRAVENOUS

## 2019-08-04 MED ORDER — SODIUM CHLORIDE 0.9 % IV SOLN: INTRAVENOUS | Status: DC

## 2019-08-04 MED ORDER — SODIUM CHLORIDE 0.9 % IV SOLN: INTRAVENOUS | Status: DC | PRN

## 2019-08-04 NOTE — H&P (Signed)
HPI: FU NICM improved by most recent echocardiogram. He also has a history of coronary artery disease with cardiomyopathy out of proportion, alcohol use, now resolved and paroxysmal atrial fibrillation holding sinus rhythm. Cardiac catheterization in October of 2007 showed a 20% left main, a 60% mid LAD and a 60-70% distal LAD. There was a 30-40% first diagonal. There was a 30% circumflex and a 40% OM. There was a 30-40% right coronary artery. Ejection fraction was 25-30%. Abdominal ultrasound in February 2012 showed no aneurysm. ABIs October 2017 normal. Last echocardiogram August 2020 showed normal LV function, mild left ventricular hypertrophy, moderate aortic stenosis with mean gradient 22 mmHg, mild aortic insufficiency, mild mitral regurgitation and moderate left atrial enlargement. Patient found to have recurrent atrial fibrillation September 2020 that was asymptomatic.  Pt seen by B Crenshaw in Dec   Found to be in afib   Was more SOB    Plan for cardioversion   Coumadin has been therapeutic since   Risk/benefits described  Pt understnads and agrees to proceed.        Current Outpatient Medications  Medication Sig Dispense Refill  . allopurinol (ZYLOPRIM) 100 MG tablet Take 1 tablet (100 mg total) by mouth daily. 90 tablet 3  . atorvastatin (LIPITOR) 40 MG tablet Take 1 tablet (40 mg total) by mouth daily. 90 tablet 3  . carvedilol (COREG) 12.5 MG tablet Take 1 tablet (12.5 mg total) by mouth 2 (two) times daily with a meal. 180 tablet 3  . warfarin (COUMADIN) 5 MG tablet Take as directed by the coumadin clinic 75 tablet 0   No current facility-administered medications for this visit.         Past Medical History:  Diagnosis Date  . Allergic rhinitis 08/08/2013   takes Loratadine daily   . Aortic stenosis 09/08/2011   With mild aortic regurgitation, mean gradient 13 mmHg   . Cataract    right but immature  . Complication of anesthesia    stopped breathing -  during shoulder surgery 2009-2010  . Coronary artery disease 10/02/2009   Cardiac cath (October 2007): 20% left main, 60% mid LAD, 60-70% distal LAD, 30-40% first diagonal, 30% circumflex, 40% OM, 30-40% RCA   . Degenerative joint disease of shoulder 08/03/2013   Bilateral, s/p right total shoulder arthroplasty 05/29/2011 and left shoulder arthroplasty 06/08/2014  . Diastolic dysfunction [id number]   Echo (04/04/2016): Grade I  . Diverticulosis 10/07/2013   Seen on colonoscopy in 2007   . Erectile dysfunction 05/07/2009  . Essential hypertension 08/03/2013   had been on Lisinopril but stopped per MD  . First degree AV block 03/14/2016  . Gastric ulcer 10/07/2013   Seen on EGD 04/10/2006   . Hemorrhoids 10/07/2013  . Hyperlipidemia 10/02/2009  . Paroxysmal atrial fibrillation ([place]) 07/14/2006   One episode, provoked by alcohol use disorder, briefly anticoagulated with warfarin, then switched to aspirin alone  . Sciatica associated with disorder of lumbar spine 08/03/2013   Anterolisthesis with right L 4-5 nerve root compression.  Treated with epidural injections and Physical Therapy.   . Seborrheic keratosis 10/07/2013  . Tubular adenoma of colon          Past Surgical History:  Procedure Laterality Date  . CARDIAC CATHETERIZATION  2007  . CARDIOVERSION    . COLONOSCOPY    . INSERTION OF MESH N/A 03/11/2019   Procedure: Insertion Of Mesh;  Surgeon: Ralene Ok, MD;  Location: [place];  Service: General;  Laterality: N/A;  .  JOINT REPLACEMENT    . KNEE ARTHROSCOPY WITH MENISCAL REPAIR Right 01/30/2011  . right finger surgery     as a child  . TONSILLECTOMY    . TOTAL KNEE ARTHROPLASTY Left 07/28/2017   Procedure: LEFT TOTAL KNEE ARTHROPLASTY;  Surgeon: Renette Butters, MD;  Location: [place];  Service: Orthopedics;  Laterality: Left;  . TOTAL SHOULDER ARTHROPLASTY  05/29/2011   Procedure: TOTAL SHOULDER ARTHROPLASTY;  Surgeon: Metta Clines Supple;  Location: [place];   Service: Orthopedics;  Laterality: Right;  . TOTAL SHOULDER ARTHROPLASTY Left 06/08/2014   DR SUPPLE  . TOTAL SHOULDER ARTHROPLASTY Left 06/08/2014   Procedure: LEFT TOTAL SHOULDER ARTHROPLASTY;  Surgeon: Marin Shutter, MD;  Location: [place];  Service: Orthopedics;  Laterality: Left;  . UMBILICAL HERNIA REPAIR N/A 03/11/2019   Procedure: LAPAROSCOPIC UMBILICAL HERNIA;  Surgeon: Ralene Ok, MD;  Location: [place];  Service: General;  Laterality: N/A;    Social History        Socioeconomic History  . Marital status: Married    Spouse name: Not on file  . Number of children: Not on file  . Years of education: Not on file  . Highest education level: Not on file  Occupational History  . Not on file  Tobacco Use  . Smoking status: Former Smoker    Types: Cigarettes    Quit date: 07/14/2010    Years since quitting: 8.9  . Smokeless tobacco: Never Used  . Tobacco comment: quit smoking about 74yrs ago  Substance and Sexual Activity  . Alcohol use: No    Comment: no alcohol since 07  . Drug use: No  . Sexual activity: Never    Birth control/protection: None  Other Topics Concern  . Not on file  [place] with wife and dog.  Former Teacher, early years/pre.   Social Determinants of Health      Financial Resource Strain:   . Difficulty of Paying Living Expenses: Not on file  Food Insecurity:   . Worried About Charity fundraiser in the Last Year: Not on file  . Ran Out of Food in the Last Year: Not on file  Transportation Needs:   . Lack of Transportation (Medical): Not on file  . Lack of Transportation (Non-Medical): Not on file  Physical Activity:   . Days of Exercise per Week: Not on file  . Minutes of Exercise per Session: Not on file  Stress:   . Feeling of Stress : Not on file  Social Connections:   . Frequency of Communication with Friends and Family: Not on file  . Frequency of  Social Gatherings with Friends and Family: Not on file  . Attends Religious Services: Not on file  . Active Member of Clubs or Organizations: Not on file  . Attends Archivist Meetings: Not on file  . Marital Status: Not on file  Intimate Partner Violence:   . Fear of Current or Ex-Partner: Not on file  . Emotionally Abused: Not on file  . Physically Abused: Not on file  . Sexually Abused: Not on file         Family History  Problem Relation Age of Onset  . Coronary artery disease Father 76       Died of myocardial infarction  . Bradycardia Mother 49       Requiring pacemeaker placement  . Coronary artery disease Brother 18  Died of myocardial infarction  . Obesity Daughter   . Healthy Son     ROS: no fevers or chills, productive cough, hemoptysis, dysphasia, odynophagia, melena, hematochezia, dysuria, hematuria, rash, seizure activity, orthopnea, PND, pedal edema, claudication. Remaining systems are negative.  Physical Exam: Well-developed well-nourished in no acute distress.  Skin is warm and dry.  HEENT is normal.  Neck is supple.  Chest is clear to auscultation with normal expansion.  Cardiovascular exam is irregular.  3/6 systolic murmur left sternal border. Abdominal exam nontender or distended. No masses palpated. Extremities show no edema. neuro grossly intact  A/P  1  Atrial fibrillation  Pt seen by B Crenshaw in Dec2020 with persistent atrial fibrillation   PResents for cardioversion today  INR has been therapeutic    2 aortic stenosis-patient will need follow-up echocardiogram August 2021.  3 coronary artery disease-continue statin.  No aspirin given need for Coumadin.  4 hypertension-blood pressure is elevated.  Add losartan 50 mg daily.  Check potassium and renal function in 1 week.  5 hyperlipidemia-continue statin.  6 history of cardiomyopathy-LV function has improved on most recent echocardiogram.  Continue beta-blocker;  add ARB for blood pressure.

## 2019-08-04 NOTE — Anesthesia Postprocedure Evaluation (Signed)
Anesthesia Post Note  Patient: [name]  Procedure(s) Performed: CARDIOVERSION (N/A )     Patient location during evaluation: PACU Anesthesia Type: General Level of consciousness: awake and alert Pain management: pain level controlled Vital Signs Assessment: post-procedure vital signs reviewed and stable Respiratory status: spontaneous breathing, nonlabored ventilation, respiratory function stable and patient connected to nasal cannula oxygen Cardiovascular status: blood pressure returned to baseline and stable Postop Assessment: no apparent nausea or vomiting Anesthetic complications: no    Last Vitals:  Vitals:   08/04/19 1012 08/04/19 1017  BP: 102/66 112/83  Pulse: 70 71  Resp: 16 15  Temp:    SpO2: 95% 96%    Last Pain:  Vitals:   08/04/19 1017  TempSrc:   PainSc: 0-No pain                 Cabrini Ruggieri DAVID

## 2019-08-04 NOTE — Anesthesia Procedure Notes (Signed)
Procedure Name: General with mask airway Date/Time: 08/04/2019 9:40 AM Performed by: Harden Mo, CRNA Pre-anesthesia Checklist: Patient identified, Emergency Drugs available, Suction available and Patient being monitored Patient Re-evaluated:Patient Re-evaluated prior to induction Oxygen Delivery Method: Ambu bag Preoxygenation: Pre-oxygenation with 100% oxygen Induction Type: IV induction Placement Confirmation: positive ETCO2 and breath sounds checked- equal and bilateral Dental Injury: Teeth and Oropharynx as per pre-operative assessment

## 2019-08-04 NOTE — Transfer of Care (Signed)
Immediate Anesthesia Transfer of Care Note  Patient: [name]  Procedure(s) Performed: CARDIOVERSION (N/A )  Patient Location: Endoscopy Unit  Anesthesia Type:General  Level of Consciousness: awake, alert  and oriented  Airway & Oxygen Therapy: Patient Spontanous Breathing  Post-op Assessment: Report given to RN, Post -op Vital signs reviewed and stable and Patient moving all extremities X 4  Post vital signs: Reviewed and stable  Last Vitals:  Vitals Value Taken Time  BP    Temp    Pulse    Resp    SpO2      Last Pain:  Vitals:   08/04/19 0900  TempSrc: Oral  PainSc: 0-No pain         Complications: No apparent anesthesia complications

## 2019-08-04 NOTE — Anesthesia Preprocedure Evaluation (Addendum)
Anesthesia Evaluation  Patient identified by MRN, date of birth, ID band Patient awake    Reviewed: Allergy & Precautions, NPO status , Patient's Chart, lab work & pertinent test results  Airway Mallampati: I  TM Distance: >3 FB Neck ROM: Full    Dental   Pulmonary former smoker,    Pulmonary exam normal        Cardiovascular hypertension, Pt. on medications + CAD  Normal cardiovascular exam+ dysrhythmias Atrial Fibrillation      Neuro/Psych    GI/Hepatic   Endo/Other    Renal/GU      Musculoskeletal   Abdominal   Peds  Hematology   Anesthesia Other Findings   Reproductive/Obstetrics                             Anesthesia Physical Anesthesia Plan  ASA: III  Anesthesia Plan: General   Post-op Pain Management:    Induction: Intravenous  PONV Risk Score and Plan: 2 and Treatment may vary due to age or medical condition  Airway Management Planned: Mask  Additional Equipment:   Intra-op Plan:   Post-operative Plan:   Informed Consent: I have reviewed the patients History and Physical, chart, labs and discussed the procedure including the risks, benefits and alternatives for the proposed anesthesia with the patient or authorized representative who has indicated his/her understanding and acceptance.       Plan Discussed with: CRNA and Surgeon  Anesthesia Plan Comments:         Anesthesia Quick Evaluation

## 2019-08-09 NOTE — Progress Notes (Signed)
CARDIOVERSION  Pt with chronic atrial fibrllation  Plan for cardioversion.  Pt understands risks, benefits and agrees to proceed  Pt sedated by anesthesia with Propofol and lidocaine intravenously  With pads in AP position, patient cardioverted to SR with 200 J synchronized biphasic energy  This only lasted a few seconds   REverted to atrial fibillation  Repeat cardioversion attempted with 200 J synchronized biphasic energy    Patient successfully cardioverted to SR and this was maintained.   12 lead EKG pending     Procedure was without complication.  Dorris Carnes MD

## 2019-08-10 ENCOUNTER — Other Ambulatory Visit: Payer: Self-pay

## 2019-08-10 ENCOUNTER — Ambulatory Visit (INDEPENDENT_AMBULATORY_CARE_PROVIDER_SITE_OTHER): Payer: Medicare HMO | Admitting: *Deleted

## 2019-08-10 DIAGNOSIS — Z5181 Encounter for therapeutic drug level monitoring: Secondary | ICD-10-CM | POA: Diagnosis not present

## 2019-08-10 DIAGNOSIS — I4819 Other persistent atrial fibrillation: Secondary | ICD-10-CM

## 2019-08-10 LAB — POCT INR: INR: 2.9 (ref 2.0–3.0)

## 2019-08-10 NOTE — Patient Instructions (Addendum)
Description   Continue on same dosage 1/2 tablet daily except 1 tablet on Mondays, Wednesdays and Fridays. Repeat INR in 3 weeks. Please call the coumadin clinic [phone], if you have any changes in your medication or upcoming procedures.

## 2019-08-26 ENCOUNTER — Ambulatory Visit: Payer: Medicare HMO | Attending: Internal Medicine

## 2019-08-26 DIAGNOSIS — Z23 Encounter for immunization: Secondary | ICD-10-CM

## 2019-08-26 NOTE — Progress Notes (Signed)
   Covid-19 Vaccination Clinic  Name:  [name]    MRN: [id number] DOB: 12-18-1945  08/26/2019  [name] was observed post Covid-19 immunization for 15 minutes without incidence. He was provided with Vaccine Information Sheet and instruction to access the V-Safe system.   [name] was instructed to call 911 with any severe reactions post vaccine: Marland Kitchen Difficulty breathing  . Swelling of your face and throat  . A fast heartbeat  . A bad rash all over your body  . Dizziness and weakness    Immunizations Administered    Name Date Dose VIS Date Route   Pfizer COVID-19 Vaccine 08/26/2019 10:57 AM 0.3 mL 06/10/2019 Intramuscular   Manufacturer: [place]   Lot: [id number]   [place]: [id number]

## 2019-08-30 NOTE — Progress Notes (Signed)
HPI: FU NICM improved by most recent echocardiogram. He also has a history of coronary artery disease with cardiomyopathy out of proportion, alcohol use, now resolved and paroxysmal atrial fibrillation holding sinus rhythm. Cardiac catheterization in October of 2007 showed a 20% left main, a 60% mid LAD and a 60-70% distal LAD. There was a 30-40% first diagonal. There was a 30% circumflex and a 40% OM. There was a 30-40% right coronary artery. Ejection fraction was 25-30%. Abdominal ultrasound in February 2012 showed no aneurysm. ABIs October 2017 normal. Last echocardiogram August 2020 showed normal LV function, mild left ventricular hypertrophy, moderate aortic stenosis with mean gradient 22 mmHg, mild aortic insufficiency, mild mitral regurgitation and moderate left atrial enlargement. Patient found to have recurrent atrial fibrillation September 2020 that was asymptomatic. Pt underwent successful cardioversion February 2021.  Since he was last seen, the patient has dyspnea with more extreme activities but not with routine activities. It is relieved with rest. It is not associated with chest pain. There is no orthopnea, PND or pedal edema. There is no syncope or palpitations. There is no exertional chest pain.   Current Outpatient Medications  Medication Sig Dispense Refill  . allopurinol (ZYLOPRIM) 100 MG tablet Take 1 tablet (100 mg total) by mouth daily. 90 tablet 3  . atorvastatin (LIPITOR) 40 MG tablet Take 1 tablet (40 mg total) by mouth daily. 90 tablet 3  . carvedilol (COREG) 12.5 MG tablet Take 1 tablet (12.5 mg total) by mouth 2 (two) times daily with a meal. 180 tablet 3  . losartan (COZAAR) 50 MG tablet Take 1 tablet (50 mg total) by mouth daily. 90 tablet 3  . warfarin (COUMADIN) 5 MG tablet Take 1 tablet by mouth once daily (Patient taking differently: Take 2.5-5 mg by mouth See admin instructions. Take 2.5 mg by mouth on Tuesday, Thursday, Saturday and Sunday, take 5 mg on  Monday, Wednesday and Friday.) 30 tablet 1   No current facility-administered medications for this visit.     Past Medical History:  Diagnosis Date  . Allergic rhinitis 08/08/2013   takes Loratadine daily   . Aortic stenosis 09/08/2011   With mild aortic regurgitation, mean gradient 13 mmHg   . Cataract    right but immature  . Complication of anesthesia    stopped breathing - during shoulder surgery 2009-2010  . Coronary artery disease 10/02/2009   Cardiac cath (October 2007): 20% left main, 60% mid LAD, 60-70% distal LAD, 30-40% first diagonal, 30% circumflex, 40% OM, 30-40% RCA   . Degenerative joint disease of shoulder 08/03/2013   Bilateral, s/p right total shoulder arthroplasty 05/29/2011 and left shoulder arthroplasty 06/08/2014  . Diastolic dysfunction A999333   Echo (04/04/2016): Grade I  . Diverticulosis 10/07/2013   Seen on colonoscopy in 2007   . Erectile dysfunction 05/07/2009  . Essential hypertension 08/03/2013   had been on Lisinopril but stopped per MD  . First degree AV block 03/14/2016  . Gastric ulcer 10/07/2013   Seen on EGD 04/10/2006   . Hemorrhoids 10/07/2013  . Hyperlipidemia 10/02/2009  . Paroxysmal atrial fibrillation ([place]) 07/14/2006   One episode, provoked by alcohol use disorder, briefly anticoagulated with warfarin, then switched to aspirin alone  . Sciatica associated with disorder of lumbar spine 08/03/2013   Anterolisthesis with right L 4-5 nerve root compression.  Treated with epidural injections and Physical Therapy.   . Seborrheic keratosis 10/07/2013  . Tubular adenoma of colon     Past Surgical History:  Procedure Laterality Date  . CARDIAC CATHETERIZATION  2007  . CARDIOVERSION    . CARDIOVERSION N/A 08/04/2019   Procedure: CARDIOVERSION;  Surgeon: Fay Records, MD;  Location: [facility] ENDOSCOPY;  Service: Cardiovascular;  Laterality: N/A;  . COLONOSCOPY    . INSERTION OF MESH N/A 03/11/2019   Procedure: Insertion Of Mesh;  Surgeon: Ralene Ok, MD;   Location: [place];  Service: General;  Laterality: N/A;  . JOINT REPLACEMENT    . KNEE ARTHROSCOPY WITH MENISCAL REPAIR Right 01/30/2011  . right finger surgery     as a child  . TONSILLECTOMY    . TOTAL KNEE ARTHROPLASTY Left 07/28/2017   Procedure: LEFT TOTAL KNEE ARTHROPLASTY;  Surgeon: Renette Butters, MD;  Location: [place];  Service: Orthopedics;  Laterality: Left;  . TOTAL SHOULDER ARTHROPLASTY  05/29/2011   Procedure: TOTAL SHOULDER ARTHROPLASTY;  Surgeon: Metta Clines Supple;  Location: [place];  Service: Orthopedics;  Laterality: Right;  . TOTAL SHOULDER ARTHROPLASTY Left 06/08/2014   DR SUPPLE  . TOTAL SHOULDER ARTHROPLASTY Left 06/08/2014   Procedure: LEFT TOTAL SHOULDER ARTHROPLASTY;  Surgeon: Marin Shutter, MD;  Location: [place];  Service: Orthopedics;  Laterality: Left;  . UMBILICAL HERNIA REPAIR N/A 03/11/2019   Procedure: LAPAROSCOPIC UMBILICAL HERNIA;  Surgeon: Ralene Ok, MD;  Location: [place];  Service: General;  Laterality: N/A;    Social History   Socioeconomic History  . Marital status: Married    Spouse name: Not on file  . Number of children: Not on file  . Years of education: Not on file  . Highest education level: Not on file  Occupational History  . Not on file  Tobacco Use  . Smoking status: Former Smoker    Types: Cigarettes    Quit date: 07/14/2010    Years since quitting: 9.1  . Smokeless tobacco: Never Used  . Tobacco comment: quit smoking about 38yrs ago  Substance and Sexual Activity  . Alcohol use: No    Comment: no alcohol since 07  . Drug use: No  . Sexual activity: Never    Birth control/protection: None  Other Topics Concern  . Not on file  [place] with wife and dog.  Former Teacher, early years/pre.   Social Determinants of Health   Financial Resource Strain:   . Difficulty of Paying Living Expenses:   Food Insecurity:   . Worried About Charity fundraiser in the Last  Year:   . Arboriculturist in the Last Year:   Transportation Needs:   . Film/video editor (Medical):   Marland Kitchen Lack of Transportation (Non-Medical):   Physical Activity:   . Days of Exercise per Week:   . Minutes of Exercise per Session:   Stress:   . Feeling of Stress :   Social Connections:   . Frequency of Communication with Friends and Family:   . Frequency of Social Gatherings with Friends and Family:   . Attends Religious Services:   . Active Member of Clubs or Organizations:   . Attends Archivist Meetings:   Marland Kitchen Marital Status:   Intimate Partner Violence:   . Fear of Current or Ex-Partner:   . Emotionally Abused:   Marland Kitchen Physically Abused:   . Sexually Abused:     Family History  Problem Relation Age of Onset  . Coronary artery disease Father 44       Died of myocardial infarction  .  Bradycardia Mother 30       Requiring pacemeaker placement  . Coronary artery disease Brother 58       Died of myocardial infarction  . Obesity Daughter   . Healthy Son     ROS: no fevers or chills, productive cough, hemoptysis, dysphasia, odynophagia, melena, hematochezia, dysuria, hematuria, rash, seizure activity, orthopnea, PND, pedal edema, claudication. Remaining systems are negative.  Physical Exam: Well-developed well-nourished in no acute distress.  Skin is warm and dry.  HEENT is normal.  Neck is supple.  Chest is clear to auscultation with normal expansion.  Cardiovascular exam is irregular and bradycardic, 2/6 systolic murmur left sternal border. Abdominal exam nontender or distended. No masses palpated. Extremities show no edema. neuro grossly intact  ECG-atrial fibrillation at a rate of 49, nonspecific ST changes.  Personally reviewed  A/P  1 H/O aortic stenosis-plan follow-up echocardiogram August 2021.  2 paroxysmal atrial fibrillation-patient is back in atrial fibrillation this morning.  However he is essentially asymptomatic.  I will therefore plan rate  control and anticoagulation.  His heart rate is low.  Decrease carvedilol to 6.25 mg twice daily and follow.  Continue Coumadin (he states DOACs were too expensive).  Check hemoglobin.  3 coronary artery disease-plan to continue statin.  He is not on aspirin given need for Coumadin.  Patient has not had recent chest pain.  4 hypertension-blood pressure mildly elevated.  However he states his systolic is typically 123456 at home.  Decrease carvedilol secondary to bradycardia and follow.  If blood pressure increases will increase losartan to 100 mg daily.  Check potassium and renal function.  5 hyperlipidemia-continue statin.  Check lipids and liver.  6 history of cardiomyopathy-LV function has improved on most recent echocardiogram.  We will continue with beta-blockade and ARB.  Kirk Ruths, MD

## 2019-08-31 ENCOUNTER — Ambulatory Visit (INDEPENDENT_AMBULATORY_CARE_PROVIDER_SITE_OTHER): Payer: Medicare HMO | Admitting: *Deleted

## 2019-08-31 ENCOUNTER — Other Ambulatory Visit: Payer: Self-pay

## 2019-08-31 DIAGNOSIS — I4819 Other persistent atrial fibrillation: Secondary | ICD-10-CM

## 2019-08-31 DIAGNOSIS — Z5181 Encounter for therapeutic drug level monitoring: Secondary | ICD-10-CM | POA: Diagnosis not present

## 2019-08-31 LAB — POCT INR: INR: 2.2 (ref 2.0–3.0)

## 2019-08-31 NOTE — Patient Instructions (Signed)
Description   Continue on same dosage 1/2 tablet daily except 1 tablet on Mondays, Wednesdays and Fridays. Repeat INR in 4 weeks. Please call the coumadin clinic [phone], if you have any changes in your medication or upcoming procedures.

## 2019-09-08 ENCOUNTER — Encounter: Payer: Self-pay | Admitting: Cardiology

## 2019-09-08 ENCOUNTER — Other Ambulatory Visit: Payer: Self-pay

## 2019-09-08 ENCOUNTER — Ambulatory Visit: Payer: Medicare HMO | Admitting: Cardiology

## 2019-09-08 ENCOUNTER — Encounter: Payer: Self-pay | Admitting: *Deleted

## 2019-09-08 VITALS — BP 142/80 | HR 49 | Temp 97.1°F | Ht 66.0 in | Wt 189.0 lb

## 2019-09-08 DIAGNOSIS — I10 Essential (primary) hypertension: Secondary | ICD-10-CM | POA: Diagnosis not present

## 2019-09-08 DIAGNOSIS — E785 Hyperlipidemia, unspecified: Secondary | ICD-10-CM

## 2019-09-08 DIAGNOSIS — I25119 Atherosclerotic heart disease of native coronary artery with unspecified angina pectoris: Secondary | ICD-10-CM

## 2019-09-08 DIAGNOSIS — I4819 Other persistent atrial fibrillation: Secondary | ICD-10-CM | POA: Diagnosis not present

## 2019-09-08 LAB — COMPREHENSIVE METABOLIC PANEL
ALT: 11 IU/L (ref 0–44)
AST: 24 IU/L (ref 0–40)
Albumin/Globulin Ratio: 1.6 (ref 1.2–2.2)
Albumin: 4.2 g/dL (ref 3.7–4.7)
Alkaline Phosphatase: 91 IU/L (ref 39–117)
BUN/Creatinine Ratio: 24 (ref 10–24)
BUN: 29 mg/dL — ABNORMAL HIGH (ref 8–27)
Bilirubin Total: 0.4 mg/dL (ref 0.0–1.2)
CO2: 20 mmol/L (ref 20–29)
Calcium: 8.8 mg/dL (ref 8.6–10.2)
Chloride: 105 mmol/L (ref 96–106)
Creatinine, Ser: 1.23 mg/dL (ref 0.76–1.27)
GFR calc Af Amer: 67 mL/min/{1.73_m2} (ref >59)
GFR calc non Af Amer: 58 mL/min/{1.73_m2} — ABNORMAL LOW (ref >59)
Globulin, Total: 2.7 g/dL (ref 1.5–4.5)
Glucose: 91 mg/dL (ref 65–99)
Potassium: 4.3 mmol/L (ref 3.5–5.2)
Sodium: 139 mmol/L (ref 134–144)
Total Protein: 6.9 g/dL (ref 6.0–8.5)

## 2019-09-08 LAB — CBC
Hematocrit: 43.6 % (ref 37.5–51.0)
Hemoglobin: 14.9 g/dL (ref 13.0–17.7)
MCH: 31.4 pg (ref 26.6–33.0)
MCHC: 34.2 g/dL (ref 31.5–35.7)
MCV: 92 fL (ref 79–97)
Platelets: 209 10*3/uL (ref 150–450)
RBC: 4.74 x10E6/uL (ref 4.14–5.80)
RDW: 12.8 % (ref 11.6–15.4)
WBC: 10.3 10*3/uL (ref 3.4–10.8)

## 2019-09-08 LAB — LIPID PANEL
Chol/HDL Ratio: 3.7 ratio (ref 0.0–5.0)
Cholesterol, Total: 119 mg/dL (ref 100–199)
HDL: 32 mg/dL — ABNORMAL LOW (ref >39)
LDL Chol Calc (NIH): 59 mg/dL (ref 0–99)
Triglycerides: 161 mg/dL — ABNORMAL HIGH (ref 0–149)
VLDL Cholesterol Cal: 28 mg/dL (ref 5–40)

## 2019-09-08 MED ORDER — CARVEDILOL 6.25 MG PO TABS: 6.2500 mg | ORAL_TABLET | Freq: Two times a day (BID) | ORAL | 3 refills | Status: DC

## 2019-09-08 NOTE — Patient Instructions (Signed)
Medication Instructions:  DECREASE CARVEDILOL TO 6.25 MG TWICE DAILY= 1/2 OF THE 12.5 MG TABLET TWICE DAILY  *If you need a refill on your cardiac medications before your next appointment, please call your pharmacy*   Lab Work: Your physician recommends that you HAVE LAB WORK TODAY If you have labs (blood work) drawn today and your tests are completely normal, you will receive your results only by: Marland Kitchen MyChart Message (if you have MyChart) OR . A paper copy in the mail If you have any lab test that is abnormal or we need to change your treatment, we will call you to review the results.   Follow-Up: At [facility], you and your health needs are our priority.  As part of our continuing mission to provide you with exceptional heart care, we have created designated Provider Care Teams.  These Care Teams include your primary Cardiologist (physician) and Advanced Practice Providers (APPs -  Physician Assistants and Nurse Practitioners) who all work together to provide you with the care you need, when you need it.  We recommend signing up for the patient portal called "MyChart".  Sign up information is provided on this After Visit Summary.  MyChart is used to connect with patients for Virtual Visits (Telemedicine).  Patients are able to view lab/test results, encounter notes, upcoming appointments, etc.  Non-urgent messages can be sent to your provider as well.   To learn more about what you can do with MyChart, go to [email or web address].    Your next appointment:   6 month(s)  The format for your next appointment:   Either In Person or Virtual  Provider:   You may see Kirk Ruths MD or one of the following Advanced Practice Providers on your designated Care Team:    Kerin Ransom, PA-C  [place], [place]  Coletta Memos, [place]

## 2019-09-20 ENCOUNTER — Ambulatory Visit: Payer: Medicare HMO | Attending: Internal Medicine

## 2019-09-20 DIAGNOSIS — Z23 Encounter for immunization: Secondary | ICD-10-CM

## 2019-09-20 NOTE — Progress Notes (Signed)
   Covid-19 Vaccination Clinic  Name:  [name]    MRN: [id number] DOB: 09-10-1945  09/20/2019  [name] was observed post Covid-19 immunization for 15 minutes without incident. He was provided with Vaccine Information Sheet and instruction to access the V-Safe system.   [name] was instructed to call 911 with any severe reactions post vaccine: Marland Kitchen Difficulty breathing  . Swelling of face and throat  . A fast heartbeat  . A bad rash all over body  . Dizziness and weakness   Immunizations Administered    Name Date Dose VIS Date Route   Pfizer COVID-19 Vaccine 09/20/2019 12:27 PM 0.3 mL 06/10/2019 Intramuscular   Manufacturer: [place]   Lot: [id number]   [place]: [id number]

## 2019-09-28 ENCOUNTER — Ambulatory Visit (INDEPENDENT_AMBULATORY_CARE_PROVIDER_SITE_OTHER): Payer: Medicare HMO | Admitting: Pharmacist

## 2019-09-28 ENCOUNTER — Other Ambulatory Visit: Payer: Self-pay

## 2019-09-28 DIAGNOSIS — I4819 Other persistent atrial fibrillation: Secondary | ICD-10-CM

## 2019-09-28 DIAGNOSIS — Z5181 Encounter for therapeutic drug level monitoring: Secondary | ICD-10-CM

## 2019-09-28 LAB — POCT INR: INR: 3.5 — AB (ref 2.0–3.0)

## 2019-09-28 MED ORDER — WARFARIN SODIUM 5 MG PO TABS: ORAL_TABLET | ORAL | 0 refills | Status: DC

## 2019-09-28 NOTE — Patient Instructions (Signed)
Description   Hold your dose tomorrow then continue on same dosage 1/2 tablet daily except 1 tablet on Mondays, Wednesdays and Fridays. Repeat INR in 3 weeks. Try to go back to eating your greens consistently. Please call the coumadin clinic [phone], if you have any changes in your medication or upcoming procedures.

## 2019-09-30 ENCOUNTER — Telehealth: Payer: Self-pay | Admitting: Cardiology

## 2019-09-30 ENCOUNTER — Telehealth: Payer: Self-pay | Admitting: Internal Medicine

## 2019-09-30 NOTE — Telephone Encounter (Signed)
New message     Patient want to know if Dr Stanford Breed will write a prescription for a prednisone dose pak---5 days of medication.  He states that he needs this because he is having problems with his sciatic nerve.

## 2019-09-30 NOTE — Telephone Encounter (Signed)
   Reason for call:   I received a call from [name] at 4 PM indicating need for medication for his sciatica.   Pertinent Data:   [name] states that he was in his usual state of health until early morning today when he twisted his hip and is now having naggin 6/10 pain that radiates down his leg. He states he has had sciatica treated with prednisone pack in the past and would like another pack dose. He denies any fall, trauma, swelling, erythema or edema. He denies any fevers, chills, nausea, vomiting, new numbness or weakness.   Assessment / Plan / Recommendations:   Discussed with [name] regarding trial of acetaminophen and short course of NSAID for relief first as prednisone therapy carries risks and should be reserved for pain resistant to conservative measures. [name] expressed understanding and agrees with the plan.  As always, pt is advised that if symptoms worsen or new symptoms arise, they should go to an urgent care facility or to to ER for further evaluation.   Mosetta Anis, MD   09/30/2019, 6:18 PM

## 2019-09-30 NOTE — Telephone Encounter (Signed)
Spoke with patient. Advised patient to contact his PCP for prednisone.

## 2019-10-03 ENCOUNTER — Telehealth: Payer: Self-pay | Admitting: Student in an Organized Health Care Education/Training Program

## 2019-10-03 ENCOUNTER — Encounter: Payer: Self-pay | Admitting: Internal Medicine

## 2019-10-03 ENCOUNTER — Ambulatory Visit (INDEPENDENT_AMBULATORY_CARE_PROVIDER_SITE_OTHER): Payer: Medicare HMO | Admitting: Internal Medicine

## 2019-10-03 DIAGNOSIS — M5416 Radiculopathy, lumbar region: Secondary | ICD-10-CM | POA: Diagnosis not present

## 2019-10-03 DIAGNOSIS — M5386 Other specified dorsopathies, lumbar region: Secondary | ICD-10-CM

## 2019-10-03 DIAGNOSIS — M5431 Sciatica, right side: Secondary | ICD-10-CM

## 2019-10-03 MED ORDER — METHYLPREDNISOLONE 4 MG PO TBPK: ORAL_TABLET | ORAL | 0 refills | Status: DC

## 2019-10-03 NOTE — Telephone Encounter (Signed)
Thank you :)

## 2019-10-03 NOTE — Telephone Encounter (Signed)
RTC, Pt states his right hip started hurting on Thursday when he bent down to pick up a jar of pennies.  He states he has been in pain since that time and he also c/o "shooting pain down the right leg now".  Pt called and spoke with resident on call on 4/2 and was advised to take tylenol, which he has without relief.  Appt in [facility] made for today at 3:15. SChaplin, RN,BSN

## 2019-10-03 NOTE — Progress Notes (Signed)
Acute Office Visit  Subjective:    Patient ID: [name], male    DOB: [birth year], 74 y.o.   MRN: [id number]  Chief Complaint  Patient presents with  . Pain    RIGHT HIP PAIN    # 8    HPI Patient is in today for right hip pain. Please see problem based charting for further details.   Past Medical History:  Diagnosis Date  . Allergic rhinitis 08/08/2013   takes Loratadine daily   . Aortic stenosis 09/08/2011   With mild aortic regurgitation, mean gradient 13 mmHg   . Cataract    right but immature  . Complication of anesthesia    stopped breathing - during shoulder surgery 2009-2010  . Coronary artery disease 10/02/2009   Cardiac cath (October 2007): 20% left main, 60% mid LAD, 60-70% distal LAD, 30-40% first diagonal, 30% circumflex, 40% OM, 30-40% RCA   . Degenerative joint disease of shoulder 08/03/2013   Bilateral, s/p right total shoulder arthroplasty 05/29/2011 and left shoulder arthroplasty 06/08/2014  . Diastolic dysfunction A999333   Echo (04/04/2016): Grade I  . Diverticulosis 10/07/2013   Seen on colonoscopy in 2007   . Erectile dysfunction 05/07/2009  . Essential hypertension 08/03/2013   had been on Lisinopril but stopped per MD  . First degree AV block 03/14/2016  . Gastric ulcer 10/07/2013   Seen on EGD 04/10/2006   . Hemorrhoids 10/07/2013  . Hyperlipidemia 10/02/2009  . Paroxysmal atrial fibrillation ([place]) 07/14/2006   One episode, provoked by alcohol use disorder, briefly anticoagulated with warfarin, then switched to aspirin alone  . Sciatica associated with disorder of lumbar spine 08/03/2013   Anterolisthesis with right L 4-5 nerve root compression.  Treated with epidural injections and Physical Therapy.   . Seborrheic keratosis 10/07/2013  . Tubular adenoma of colon     Past Surgical History:  Procedure Laterality Date  . CARDIAC CATHETERIZATION  2007  . CARDIOVERSION    . CARDIOVERSION N/A 08/04/2019   Procedure: CARDIOVERSION;  Surgeon: Fay Records, MD;  Location: [facility] ENDOSCOPY;  Service: Cardiovascular;  Laterality: N/A;  . COLONOSCOPY    . INSERTION OF MESH N/A 03/11/2019   Procedure: Insertion Of Mesh;  Surgeon: Ralene Ok, MD;  Location: [place];  Service: General;  Laterality: N/A;  . JOINT REPLACEMENT    . KNEE ARTHROSCOPY WITH MENISCAL REPAIR Right 01/30/2011  . right finger surgery     as a child  . TONSILLECTOMY    . TOTAL KNEE ARTHROPLASTY Left 07/28/2017   Procedure: LEFT TOTAL KNEE ARTHROPLASTY;  Surgeon: Renette Butters, MD;  Location: [place];  Service: Orthopedics;  Laterality: Left;  . TOTAL SHOULDER ARTHROPLASTY  05/29/2011   Procedure: TOTAL SHOULDER ARTHROPLASTY;  Surgeon: Metta Clines Supple;  Location: [place];  Service: Orthopedics;  Laterality: Right;  . TOTAL SHOULDER ARTHROPLASTY Left 06/08/2014   DR SUPPLE  . TOTAL SHOULDER ARTHROPLASTY Left 06/08/2014   Procedure: LEFT TOTAL SHOULDER ARTHROPLASTY;  Surgeon: Marin Shutter, MD;  Location: [place];  Service: Orthopedics;  Laterality: Left;  . UMBILICAL HERNIA REPAIR N/A 03/11/2019   Procedure: LAPAROSCOPIC UMBILICAL HERNIA;  Surgeon: Ralene Ok, MD;  Location: [facility] OR;  Service: General;  Laterality: N/A;    Family History  Problem Relation Age of Onset  . Coronary artery disease Father 48       Died of myocardial infarction  . Bradycardia Mother 5       Requiring pacemeaker placement  . Coronary artery  disease Brother 51       Died of myocardial infarction  . Obesity Daughter   . Healthy Son     Social History   Socioeconomic History  . Marital status: Married    Spouse name: Not on file  . Number of children: Not on file  . Years of education: Not on file  . Highest education level: Not on file  Occupational History  . Not on file  Tobacco Use  . Smoking status: Former Smoker    Types: Cigarettes    Quit date: 07/14/2010    Years since quitting: 9.2  . Smokeless tobacco: Never Used  . Tobacco comment: quit smoking about 57yrs ago   Substance and Sexual Activity  . Alcohol use: No    Comment: no alcohol since 07  . Drug use: No  . Sexual activity: Never    Birth control/protection: None  Other Topics Concern  . Not on file  [place] with wife and dog.  Former Teacher, early years/pre.   Social Determinants of Health   Financial Resource Strain:   . Difficulty of Paying Living Expenses:   Food Insecurity:   . Worried About Charity fundraiser in the Last Year:   . Arboriculturist in the Last Year:   Transportation Needs:   . Film/video editor (Medical):   Marland Kitchen Lack of Transportation (Non-Medical):   Physical Activity:   . Days of Exercise per Week:   . Minutes of Exercise per Session:   Stress:   . Feeling of Stress :   Social Connections:   . Frequency of Communication with Friends and Family:   . Frequency of Social Gatherings with Friends and Family:   . Attends Religious Services:   . Active Member of Clubs or Organizations:   . Attends Archivist Meetings:   Marland Kitchen Marital Status:   Intimate Partner Violence:   . Fear of Current or Ex-Partner:   . Emotionally Abused:   Marland Kitchen Physically Abused:   . Sexually Abused:     Outpatient Medications Prior to Visit  Medication Sig Dispense Refill  . allopurinol (ZYLOPRIM) 100 MG tablet Take 1 tablet (100 mg total) by mouth daily. 90 tablet 3  . atorvastatin (LIPITOR) 40 MG tablet Take 1 tablet (40 mg total) by mouth daily. 90 tablet 3  . carvedilol (COREG) 6.25 MG tablet Take 1 tablet (6.25 mg total) by mouth 2 (two) times daily with a meal. 180 tablet 3  . losartan (COZAAR) 50 MG tablet Take 1 tablet (50 mg total) by mouth daily. 90 tablet 3  . warfarin (COUMADIN) 5 MG tablet Take 1/2 to 1 tablet by mouth daily as directed by Coumadin clinic 90 tablet 0   No facility-administered medications prior to visit.    No Known Allergies  Review of Systems  Constitutional: negative for  fevers, chills Neuro: negative for weakness, bowel/bladder incontinence, saddle anesthesia  MSK: no other joint or muscular pain      Objective:    Physical Exam Constitutional:      General: He is not in acute distress.    Appearance: Normal appearance.  Musculoskeletal:     Lumbar back: Positive right straight leg raise test.     Comments: TTP just off midline at L4/L5. Normal ROM.   Neurological:     Mental Status: He is alert.     Sensory: No sensory deficit.  Motor: No weakness.     Gait: Gait normal.     Deep Tendon Reflexes: Reflexes normal.     BP (!) 172/80 (BP Location: Left Arm, Patient Position: Sitting, Cuff Size: Normal)   Pulse 78   Temp 97.7 F (36.5 C) (Oral)   Ht 5\' 6"  (1.676 m)   Wt 193 lb 9.6 oz (87.8 kg)   SpO2 98%   BMI 31.25 kg/m  Wt Readings from Last 3 Encounters:  10/03/19 193 lb 9.6 oz (87.8 kg)  09/08/19 189 lb (85.7 kg)  08/04/19 183 lb (83 kg)    Health Maintenance Due  Topic Date Due  . COLONOSCOPY  04/11/2011    There are no preventive care reminders to display for this patient.   Lab Results  Component Value Date   TSH 2.624 03/22/2019   Lab Results  Component Value Date   WBC 10.3 09/08/2019   HGB 14.9 09/08/2019   HCT 43.6 09/08/2019   MCV 92 09/08/2019   PLT 209 09/08/2019   Lab Results  Component Value Date   NA 139 09/08/2019   K 4.3 09/08/2019   CO2 20 09/08/2019   GLUCOSE 91 09/08/2019   BUN 29 (H) 09/08/2019   CREATININE 1.23 09/08/2019   BILITOT 0.4 09/08/2019   ALKPHOS 91 09/08/2019   AST 24 09/08/2019   ALT 11 09/08/2019   PROT 6.9 09/08/2019   ALBUMIN 4.2 09/08/2019   CALCIUM 8.8 09/08/2019   ANIONGAP 10 03/03/2019   GFR 67.86 07/05/2012   Lab Results  Component Value Date   CHOL 119 09/08/2019   Lab Results  Component Value Date   HDL 32 (L) 09/08/2019   Lab Results  Component Value Date   LDLCALC 59 09/08/2019   Lab Results  Component Value Date   TRIG 161 (H) 09/08/2019   Lab  Results  Component Value Date   CHOLHDL 3.7 09/08/2019   Lab Results  Component Value Date   HGBA1C 5.7 (A) 01/24/2019       Assessment & Plan:   Problem List Items Addressed This Visit      Nervous and Auditory   Sciatica associated with disorder of lumbar spine (Chronic)    Patient presents with 4 day history of acute exacerbation of lumbar pain with radiculopathy into his right leg after bending over and lifting heavy objects while helping his daughter move. He has tried Tylenol and NSAIDs with minimal relief. He does not have any red flag symptoms or findings on exam. He has not had a flare up like this in 2-3 years, and last time he had good relief with steroid taper. He would like to try this again. Given that it is still in the acute phase, think it is reasonable to try medrol dosepak to try to help with pain relief.  He was instructed to call or return if he has worsening symptoms or has not seen improvement in 2-3 weeks.           Meds ordered this encounter  Medications  . methylPREDNISolone (MEDROL DOSEPAK) 4 MG TBPK tablet    Sig: Take 6 tabs the first day and decrease by one tab for the next 5 days.    Dispense:  21 each    Refill:  0     Sabena Winner D Telena Peyser, DO

## 2019-10-03 NOTE — Telephone Encounter (Signed)
Pls contact pt [phone]

## 2019-10-03 NOTE — Patient Instructions (Addendum)
[name], It was a pleasure meeting you. Sorry to hear about the flare up of your back pain. We can try the steroid dose pack that you've had success with in the past. If it is not any better in the next 1-2 weeks after taking this, please call back.   Otherwise, you can plan to see Dr. Evette Doffing for your next follow-up visit.   Take care, Dr. Koleen Distance +

## 2019-10-04 ENCOUNTER — Encounter: Payer: Self-pay | Admitting: Internal Medicine

## 2019-10-04 NOTE — Assessment & Plan Note (Signed)
Patient presents with 4 day history of acute exacerbation of lumbar pain with radiculopathy into his right leg after bending over and lifting heavy objects while helping his daughter move. He has tried Tylenol and NSAIDs with minimal relief. He does not have any red flag symptoms or findings on exam. He has not had a flare up like this in 2-3 years, and last time he had good relief with steroid taper. He would like to try this again. Given that it is still in the acute phase, think it is reasonable to try medrol dosepak to try to help with pain relief.  He was instructed to call or return if he has worsening symptoms or has not seen improvement in 2-3 weeks.

## 2019-10-05 NOTE — Progress Notes (Signed)
Internal Medicine Clinic Attending  Case discussed with Dr. Bloomfield at the time of the visit.  We reviewed the resident's history and exam and pertinent patient test results.  I agree with the assessment, diagnosis, and plan of care documented in the resident's note.  

## 2019-10-20 ENCOUNTER — Ambulatory Visit (INDEPENDENT_AMBULATORY_CARE_PROVIDER_SITE_OTHER): Payer: Medicare HMO | Admitting: *Deleted

## 2019-10-20 ENCOUNTER — Encounter: Payer: Self-pay | Admitting: Internal Medicine

## 2019-10-20 ENCOUNTER — Ambulatory Visit (INDEPENDENT_AMBULATORY_CARE_PROVIDER_SITE_OTHER): Payer: Medicare HMO | Admitting: Internal Medicine

## 2019-10-20 ENCOUNTER — Other Ambulatory Visit: Payer: Self-pay

## 2019-10-20 VITALS — BP 128/68 | HR 80 | Temp 97.7°F | Ht 66.0 in | Wt 190.7 lb

## 2019-10-20 DIAGNOSIS — I4819 Other persistent atrial fibrillation: Secondary | ICD-10-CM | POA: Diagnosis not present

## 2019-10-20 DIAGNOSIS — M5441 Lumbago with sciatica, right side: Secondary | ICD-10-CM

## 2019-10-20 DIAGNOSIS — Z5181 Encounter for therapeutic drug level monitoring: Secondary | ICD-10-CM

## 2019-10-20 DIAGNOSIS — M5386 Other specified dorsopathies, lumbar region: Secondary | ICD-10-CM

## 2019-10-20 LAB — POCT INR: INR: 2.9 (ref 2.0–3.0)

## 2019-10-20 MED ORDER — METHYLPREDNISOLONE 4 MG PO TBPK: ORAL_TABLET | ORAL | 0 refills | Status: DC

## 2019-10-20 NOTE — Patient Instructions (Addendum)
[name],  It was nice meeting you today! I am sorry you are continuing to have back and knee pain. I will refer you back to Dr. Debroah Loop office to follow-up and discuss potential procedures or surgeries to help alleviate your pain. In the meantime, I have sent into [facility] a 6 day steroid course.   Thank you for letting us be a part of your care!

## 2019-10-20 NOTE — Progress Notes (Signed)
CC: knee and back pain  HPI:  [name] is a 74 y.o. M with significant PMH as outlined below, who presents for follow-up on R sided back pain and R knee pain. Please see problem-based charting for additional information.  Past Medical History:  Diagnosis Date  . Allergic rhinitis 08/08/2013   takes Loratadine daily   . Aortic stenosis 09/08/2011   With mild aortic regurgitation, mean gradient 13 mmHg   . Cataract    right but immature  . Complication of anesthesia    stopped breathing - during shoulder surgery 2009-2010  . Coronary artery disease 10/02/2009   Cardiac cath (October 2007): 20% left main, 60% mid LAD, 60-70% distal LAD, 30-40% first diagonal, 30% circumflex, 40% OM, 30-40% RCA   . Degenerative joint disease of shoulder 08/03/2013   Bilateral, s/p right total shoulder arthroplasty 05/29/2011 and left shoulder arthroplasty 06/08/2014  . Diastolic dysfunction [id number]   Echo (04/04/2016): Grade I  . Diverticulosis 10/07/2013   Seen on colonoscopy in 2007   . Erectile dysfunction 05/07/2009  . Essential hypertension 08/03/2013   had been on Lisinopril but stopped per MD  . First degree AV block 03/14/2016  . Gastric ulcer 10/07/2013   Seen on EGD 04/10/2006   . Hemorrhoids 10/07/2013  . Hyperlipidemia 10/02/2009  . Paroxysmal atrial fibrillation ([place]) 07/14/2006   One episode, provoked by alcohol use disorder, briefly anticoagulated with warfarin, then switched to aspirin alone  . Sciatica associated with disorder of lumbar spine 08/03/2013   Anterolisthesis with right L 4-5 nerve root compression.  Treated with epidural injections and Physical Therapy.   . Seborrheic keratosis 10/07/2013  . Tubular adenoma of colon    Review of Systems:   Review of Systems  Constitutional: Negative for chills, fever and weight loss.  Respiratory: Negative for shortness of breath.   Cardiovascular: Negative for chest pain.  Gastrointestinal: Negative for abdominal pain, nausea and  vomiting.  Genitourinary: Negative for dysuria and frequency.  Musculoskeletal: Positive for back pain and joint pain (R knee). Negative for myalgias and neck pain.  Neurological: Negative for dizziness, speech change, weakness and headaches.   Physical Exam:  Vitals:   10/20/19 0932  BP: 128/68  Pulse: 80  Temp: 97.7 F (36.5 C)  TempSrc: Oral  SpO2: 97%  Weight: 190 lb 11.2 oz (86.5 kg)  Height: 5\' 6"  (1.676 m)   Physical Exam Vitals and nursing note reviewed.  Constitutional:      General: He is not in acute distress.    Appearance: He is not ill-appearing.  Cardiovascular:     Rate and Rhythm: Normal rate. Rhythm irregular.     Heart sounds: Murmur present.  Pulmonary:     Effort: Pulmonary effort is normal.     Breath sounds: Normal breath sounds.  Musculoskeletal:        General: No swelling or deformity.     Right lower leg: No edema.     Left lower leg: No edema.     Comments: Normal strength and ROM in R knee. No pain or laxity provoked on varus or valgus stress. No crepitus or tenderness to palpation of joint. No effusion.  Pt with point tenderness over PSIS on R side.  Neurological:     General: No focal deficit present.     Mental Status: He is alert and oriented to person, place, and time.    Assessment & Plan:   See Encounters Tab for problem based charting.  Patient discussed  with Dr. Dareen Piano

## 2019-10-20 NOTE — Patient Instructions (Signed)
Description   Continue on same dosage 1/2 tablet daily except 1 tablet on Mondays, Wednesdays and Fridays. Repeat INR in 4 weeks. Try to go back to eating your greens consistently. Please call the coumadin clinic [phone], if you have any changes in your medication or upcoming procedures.

## 2019-10-21 ENCOUNTER — Ambulatory Visit: Payer: Medicare HMO

## 2019-10-21 NOTE — Assessment & Plan Note (Signed)
Pt presents for persistent R sided back pain radiating down the R leg and R sided knee pain. Was prescribed medrol dosepak two weeks ago for an acute exacerbation of chronic lumbar and R sided radicular pain. Pt states by day 4 of the six day steroid taper he was feeling improved. Unfortunately, pt mowed the grass 1 week ago and re-aggreviated his pain. Has been taking Tylenol at home which has not significantly improved his symptoms. Denies red flag symptoms. Has required epidural steroid injections in the past by orthopedics, thinks the most recent was maybe three years ago. Pt wishes to try a second steroid pack and/or repeat steroid injections to treat his pain as these have worked previously. Pt also with worsening R knee pain in the setting of known osteoarthritis. Examination with normal strength, sensation, and ROM in the R knee and hip. Point tenderness over R PSIS.  - ordered second 6 day medrol dosepak given pt's good response previously - instructed pt to try Voltaren gel on his R knee - will refer to orthopedics for further management and potential epidural steroid injections

## 2019-10-26 NOTE — Progress Notes (Signed)
Internal Medicine Clinic Attending  Case discussed with Dr. Jones at the time of the visit.  We reviewed the resident's history and exam and pertinent patient test results.  I agree with the assessment, diagnosis, and plan of care documented in the resident's note.  

## 2019-10-28 DIAGNOSIS — M25561 Pain in right knee: Secondary | ICD-10-CM | POA: Diagnosis not present

## 2019-10-28 DIAGNOSIS — M545 Low back pain: Secondary | ICD-10-CM | POA: Diagnosis not present

## 2019-10-28 DIAGNOSIS — M25562 Pain in left knee: Secondary | ICD-10-CM | POA: Diagnosis not present

## 2019-11-04 ENCOUNTER — Ambulatory Visit (INDEPENDENT_AMBULATORY_CARE_PROVIDER_SITE_OTHER): Payer: Medicare HMO | Admitting: Otolaryngology

## 2019-11-04 ENCOUNTER — Encounter (INDEPENDENT_AMBULATORY_CARE_PROVIDER_SITE_OTHER): Payer: Self-pay | Admitting: Otolaryngology

## 2019-11-04 ENCOUNTER — Other Ambulatory Visit: Payer: Self-pay

## 2019-11-04 VITALS — Temp 98.1°F

## 2019-11-04 DIAGNOSIS — H6123 Impacted cerumen, bilateral: Secondary | ICD-10-CM

## 2019-11-04 NOTE — Progress Notes (Signed)
HPI: [name] is a 74 y.o. male who presents for evaluation of blockage of his left ear with slight discomfort.  He has decreased hearing on the left side now for over a week.  He wears a hearing aid in the right ear..  Past Medical History:  Diagnosis Date  . Allergic rhinitis 08/08/2013   takes Loratadine daily   . Aortic stenosis 09/08/2011   With mild aortic regurgitation, mean gradient 13 mmHg   . Cataract    right but immature  . Complication of anesthesia    stopped breathing - during shoulder surgery 2009-2010  . Coronary artery disease 10/02/2009   Cardiac cath (October 2007): 20% left main, 60% mid LAD, 60-70% distal LAD, 30-40% first diagonal, 30% circumflex, 40% OM, 30-40% RCA   . Degenerative joint disease of shoulder 08/03/2013   Bilateral, s/p right total shoulder arthroplasty 05/29/2011 and left shoulder arthroplasty 06/08/2014  . Diastolic dysfunction [id number]   Echo (04/04/2016): Grade I  . Diverticulosis 10/07/2013   Seen on colonoscopy in 2007   . Erectile dysfunction 05/07/2009  . Essential hypertension 08/03/2013   had been on Lisinopril but stopped per MD  . First degree AV block 03/14/2016  . Gastric ulcer 10/07/2013   Seen on EGD 04/10/2006   . Hemorrhoids 10/07/2013  . Hyperlipidemia 10/02/2009  . Paroxysmal atrial fibrillation ([place]) 07/14/2006   One episode, provoked by alcohol use disorder, briefly anticoagulated with warfarin, then switched to aspirin alone  . Sciatica associated with disorder of lumbar spine 08/03/2013   Anterolisthesis with right L 4-5 nerve root compression.  Treated with epidural injections and Physical Therapy.   . Seborrheic keratosis 10/07/2013  . Tubular adenoma of colon    Past Surgical History:  Procedure Laterality Date  . CARDIAC CATHETERIZATION  2007  . CARDIOVERSION    . CARDIOVERSION N/A 08/04/2019   Procedure: CARDIOVERSION;  Surgeon: Fay Records, MD;  Location: [facility] ENDOSCOPY;  Service: Cardiovascular;  Laterality: N/A;  .  COLONOSCOPY    . INSERTION OF MESH N/A 03/11/2019   Procedure: Insertion Of Mesh;  Surgeon: Ralene Ok, MD;  Location: [facility];  Service: General;  Laterality: N/A;  . JOINT REPLACEMENT    . KNEE ARTHROSCOPY WITH MENISCAL REPAIR Right 01/30/2011  . right finger surgery     as a child  . TONSILLECTOMY    . TOTAL KNEE ARTHROPLASTY Left 07/28/2017   Procedure: LEFT TOTAL KNEE ARTHROPLASTY;  Surgeon: Renette Butters, MD;  Location: [facility];  Service: Orthopedics;  Laterality: Left;  . TOTAL SHOULDER ARTHROPLASTY  05/29/2011   Procedure: TOTAL SHOULDER ARTHROPLASTY;  Surgeon: Metta Clines Supple;  Location: [facility];  Service: Orthopedics;  Laterality: Right;  . TOTAL SHOULDER ARTHROPLASTY Left 06/08/2014   DR SUPPLE  . TOTAL SHOULDER ARTHROPLASTY Left 06/08/2014   Procedure: LEFT TOTAL SHOULDER ARTHROPLASTY;  Surgeon: Marin Shutter, MD;  Location: [facility];  Service: Orthopedics;  Laterality: Left;  . UMBILICAL HERNIA REPAIR N/A 03/11/2019   Procedure: LAPAROSCOPIC UMBILICAL HERNIA;  Surgeon: Ralene Ok, MD;  Location: [facility];  Service: General;  Laterality: N/A;   Social History   Socioeconomic History  . Marital status: Married    Spouse name: Not on file  . Number of children: Not on file  . Years of education: Not on file  . Highest education level: Not on file  Occupational History  . Not on file  Tobacco Use  . Smoking status: Former Smoker    Packs/day: 1.50  Years: 42.00    Pack years: 63.00    Types: Cigarettes    Start date: 55    Quit date: 07/14/2010    Years since quitting: 9.3  . Smokeless tobacco: Never Used  . Tobacco comment: quit smoking about 49yrs ago  Substance and Sexual Activity  . Alcohol use: No    Comment: no alcohol since 07  . Drug use: No  . Sexual activity: Never    Birth control/protection: None  Other Topics Concern  . Not on file  [place] with wife and dog.  Former Research officer, trade union.   Social Determinants of Health   Financial Resource Strain:   . Difficulty of Paying Living Expenses:   Food Insecurity:   . Worried About Charity fundraiser in the Last Year:   . Arboriculturist in the Last Year:   Transportation Needs:   . Film/video editor (Medical):   Marland Kitchen Lack of Transportation (Non-Medical):   Physical Activity:   . Days of Exercise per Week:   . Minutes of Exercise per Session:   Stress:   . Feeling of Stress :   Social Connections:   . Frequency of Communication with Friends and Family:   . Frequency of Social Gatherings with Friends and Family:   . Attends Religious Services:   . Active Member of Clubs or Organizations:   . Attends Archivist Meetings:   Marland Kitchen Marital Status:    Family History  Problem Relation Age of Onset  . Coronary artery disease Father 48       Died of myocardial infarction  . Bradycardia Mother 66       Requiring pacemeaker placement  . Coronary artery disease Brother 74       Died of myocardial infarction  . Obesity Daughter   . Healthy Son    No Known Allergies Prior to Admission medications   Medication Sig Start Date End Date Taking? Authorizing Provider  allopurinol (ZYLOPRIM) 100 MG tablet Take 1 tablet (100 mg total) by mouth daily. 04/25/19   Axel Filler, MD  atorvastatin (LIPITOR) 40 MG tablet Take 1 tablet (40 mg total) by mouth daily. 01/03/19   Axel Filler, MD  carvedilol (COREG) 6.25 MG tablet Take 1 tablet (6.25 mg total) by mouth 2 (two) times daily with a meal. 09/08/19   Crenshaw, Denice Bors, MD  losartan (COZAAR) 50 MG tablet Take 1 tablet (50 mg total) by mouth daily. 06/10/19 09/08/19  Lelon Perla, MD  methylPREDNISolone (MEDROL DOSEPAK) 4 MG TBPK tablet Take 6 tabs the first day and decrease by one tab for the next 5 days. 10/20/19   Ladona Horns, MD  warfarin (COUMADIN) 5 MG tablet Take 1/2 to 1 tablet by mouth daily as directed by Coumadin clinic  09/28/19   Lelon Perla, MD     Positive ROS: Otherwise negative  All other systems have been reviewed and were otherwise negative with the exception of those mentioned in the HPI and as above.  Physical Exam: Constitutional: Alert, well-appearing, no acute distress Ears: External ears without lesions or tenderness. Ear canals left ear canal is completely occluded with a large piece of hard wax that was removed with forceps.  The TM is clear.  Right ear canal has small amount of wax that was removed with a curette and forceps.  The right TM is clear also.. Nasal: External nose  without lesions. Clear nasal passages Oral: Oropharynx clear. Neck: No palpable adenopathy or masses Respiratory: Breathing comfortably  Skin: No facial/neck lesions or rash noted.  Cerumen impaction removal  Date/Time: 11/04/2019 6:10 PM Performed by: Rozetta Nunnery, MD Authorized by: Rozetta Nunnery, MD   Consent:    Consent obtained:  Verbal   Consent given by:  Patient   Risks discussed:  Pain and bleeding Procedure details:    Location:  L ear and R ear   Procedure type: curette and forceps   Post-procedure details:    Inspection:  TM intact and canal normal   Hearing quality:  Improved   Patient tolerance of procedure:  Tolerated well, no immediate complications Comments:     Patient with a large amount of wax in the left ear canal and a small amount of wax in the right ear canal.  This was removed bilaterally.  Both TMs are clear.    Assessment: Cerumen impaction  Plan: He will follow-up as needed  Radene Journey, MD

## 2019-11-14 ENCOUNTER — Telehealth: Payer: Self-pay | Admitting: Cardiology

## 2019-11-14 NOTE — Telephone Encounter (Signed)
The patient was calling because he thought he was taking Losartan 5 mg and not 50. He has been advised that it is Losartan 50 mg once daily.

## 2019-11-14 NOTE — Telephone Encounter (Signed)
Left message to call back  

## 2019-11-14 NOTE — Telephone Encounter (Signed)
Pt c/o medication issue:  1. Name of Medication: losartan  2. How are you currently taking this medication (dosage and times per day)?  3. Are you having a reaction (difficulty breathing--STAT)?  4. What is your medication issue? Patient was calling about his losartan, he states he recently switched pharmacy and he wants to make sure it is 50mg  he is suppose to take. He thought he was taking 5mg .

## 2019-11-17 ENCOUNTER — Ambulatory Visit (INDEPENDENT_AMBULATORY_CARE_PROVIDER_SITE_OTHER): Payer: Medicare HMO | Admitting: *Deleted

## 2019-11-17 ENCOUNTER — Other Ambulatory Visit: Payer: Self-pay

## 2019-11-17 DIAGNOSIS — I4819 Other persistent atrial fibrillation: Secondary | ICD-10-CM | POA: Diagnosis not present

## 2019-11-17 DIAGNOSIS — Z5181 Encounter for therapeutic drug level monitoring: Secondary | ICD-10-CM

## 2019-11-17 LAB — POCT INR: INR: 2.9 (ref 2.0–3.0)

## 2019-11-17 NOTE — Patient Instructions (Addendum)
Description   Continue on same dosage 1/2 tablet daily except 1 tablet on Mondays, Wednesdays and Fridays. Repeat INR in 5 weeks. Please call the coumadin clinic [phone], if you have any changes in your medication or upcoming procedures.

## 2019-11-23 ENCOUNTER — Other Ambulatory Visit: Payer: Self-pay | Admitting: Cardiology

## 2019-12-22 ENCOUNTER — Other Ambulatory Visit: Payer: Self-pay

## 2019-12-22 ENCOUNTER — Ambulatory Visit (INDEPENDENT_AMBULATORY_CARE_PROVIDER_SITE_OTHER): Payer: Medicare HMO | Admitting: *Deleted

## 2019-12-22 DIAGNOSIS — I4819 Other persistent atrial fibrillation: Secondary | ICD-10-CM | POA: Diagnosis not present

## 2019-12-22 DIAGNOSIS — Z5181 Encounter for therapeutic drug level monitoring: Secondary | ICD-10-CM

## 2019-12-22 LAB — POCT INR: INR: 2.3 (ref 2.0–3.0)

## 2019-12-22 NOTE — Patient Instructions (Signed)
Description   Continue taking Warfarin 1/2 tablet daily except 1 tablet on Mondays, Wednesdays and Fridays. Repeat INR in 6 weeks. Please call the coumadin clinic [phone], if you have any changes in your medication or upcoming procedures.      

## 2019-12-28 DIAGNOSIS — H40023 Open angle with borderline findings, high risk, bilateral: Secondary | ICD-10-CM | POA: Diagnosis not present

## 2019-12-28 DIAGNOSIS — H2513 Age-related nuclear cataract, bilateral: Secondary | ICD-10-CM | POA: Diagnosis not present

## 2020-01-20 ENCOUNTER — Other Ambulatory Visit: Payer: Self-pay | Admitting: Student in an Organized Health Care Education/Training Program

## 2020-01-26 DIAGNOSIS — H2513 Age-related nuclear cataract, bilateral: Secondary | ICD-10-CM | POA: Diagnosis not present

## 2020-01-26 DIAGNOSIS — H18413 Arcus senilis, bilateral: Secondary | ICD-10-CM | POA: Diagnosis not present

## 2020-01-26 DIAGNOSIS — H25043 Posterior subcapsular polar age-related cataract, bilateral: Secondary | ICD-10-CM | POA: Diagnosis not present

## 2020-01-26 DIAGNOSIS — H25013 Cortical age-related cataract, bilateral: Secondary | ICD-10-CM | POA: Diagnosis not present

## 2020-01-26 DIAGNOSIS — H2512 Age-related nuclear cataract, left eye: Secondary | ICD-10-CM | POA: Diagnosis not present

## 2020-01-26 DIAGNOSIS — H40023 Open angle with borderline findings, high risk, bilateral: Secondary | ICD-10-CM | POA: Diagnosis not present

## 2020-01-31 ENCOUNTER — Other Ambulatory Visit: Payer: Self-pay | Admitting: Cardiology

## 2020-02-02 ENCOUNTER — Ambulatory Visit (INDEPENDENT_AMBULATORY_CARE_PROVIDER_SITE_OTHER): Payer: Medicare HMO | Admitting: *Deleted

## 2020-02-02 ENCOUNTER — Other Ambulatory Visit: Payer: Self-pay

## 2020-02-02 DIAGNOSIS — Z5181 Encounter for therapeutic drug level monitoring: Secondary | ICD-10-CM

## 2020-02-02 DIAGNOSIS — I4819 Other persistent atrial fibrillation: Secondary | ICD-10-CM

## 2020-02-02 LAB — POCT INR: INR: 2.1 (ref 2.0–3.0)

## 2020-02-02 NOTE — Patient Instructions (Signed)
Description   Continue taking Warfarin 1/2 tablet daily except 1 tablet on Mondays, Wednesdays and Fridays. Repeat INR in 6 weeks. Please call the coumadin clinic [phone], if you have any changes in your medication or upcoming procedures.      

## 2020-02-07 DIAGNOSIS — H40023 Open angle with borderline findings, high risk, bilateral: Secondary | ICD-10-CM | POA: Diagnosis not present

## 2020-02-08 DIAGNOSIS — H2512 Age-related nuclear cataract, left eye: Secondary | ICD-10-CM | POA: Diagnosis not present

## 2020-02-09 DIAGNOSIS — H2511 Age-related nuclear cataract, right eye: Secondary | ICD-10-CM | POA: Diagnosis not present

## 2020-02-22 DIAGNOSIS — H2511 Age-related nuclear cataract, right eye: Secondary | ICD-10-CM | POA: Diagnosis not present

## 2020-02-22 DIAGNOSIS — H2512 Age-related nuclear cataract, left eye: Secondary | ICD-10-CM | POA: Diagnosis not present

## 2020-03-12 ENCOUNTER — Encounter: Payer: Self-pay | Admitting: Student in an Organized Health Care Education/Training Program

## 2020-03-12 ENCOUNTER — Ambulatory Visit (INDEPENDENT_AMBULATORY_CARE_PROVIDER_SITE_OTHER): Payer: Medicare HMO | Admitting: Student in an Organized Health Care Education/Training Program

## 2020-03-12 ENCOUNTER — Other Ambulatory Visit: Payer: Self-pay

## 2020-03-12 VITALS — BP 133/57 | HR 64 | Temp 98.2°F | Ht 66.0 in | Wt 188.7 lb

## 2020-03-12 DIAGNOSIS — I4891 Unspecified atrial fibrillation: Secondary | ICD-10-CM | POA: Diagnosis not present

## 2020-03-12 DIAGNOSIS — J301 Allergic rhinitis due to pollen: Secondary | ICD-10-CM | POA: Diagnosis not present

## 2020-03-12 DIAGNOSIS — I350 Nonrheumatic aortic (valve) stenosis: Secondary | ICD-10-CM | POA: Diagnosis not present

## 2020-03-12 DIAGNOSIS — I10 Essential (primary) hypertension: Secondary | ICD-10-CM

## 2020-03-12 DIAGNOSIS — R7303 Prediabetes: Secondary | ICD-10-CM

## 2020-03-12 DIAGNOSIS — M1A0720 Idiopathic chronic gout, left ankle and foot, without tophus (tophi): Secondary | ICD-10-CM | POA: Diagnosis not present

## 2020-03-12 DIAGNOSIS — D369 Benign neoplasm, unspecified site: Secondary | ICD-10-CM | POA: Diagnosis not present

## 2020-03-12 DIAGNOSIS — Z23 Encounter for immunization: Secondary | ICD-10-CM | POA: Diagnosis not present

## 2020-03-12 LAB — POCT GLYCOSYLATED HEMOGLOBIN (HGB A1C): Hemoglobin A1C: 5.6 % (ref 4.0–5.6)

## 2020-03-12 LAB — GLUCOSE, CAPILLARY: Glucose-Capillary: 114 mg/dL — ABNORMAL HIGH (ref 70–99)

## 2020-03-12 NOTE — Assessment & Plan Note (Signed)
Asymptomatic with no chest pain or presyncope.  Has some dyspnea on exertion, but not clear this is coming from the aortic stenosis.  Last echocardiogram was August 2020, will order another echo for annual monitoring.  He will follow up with Dr. Stanford Breed in November.

## 2020-03-12 NOTE — Assessment & Plan Note (Signed)
Occasional symptoms of allergic rhinitis and sinusitis.  We talked about sinus irrigation today, which she will try.  Recommended intranasal steroid with over-the-counter Flonase as needed when he has irritative symptoms.

## 2020-03-12 NOTE — Assessment & Plan Note (Signed)
One gout flare in the last year, which is a nice reduction since starting allopurinol.  Plan to check uric acid today, goal less than 6.  Continue with allopurinol 100 mg daily for now.

## 2020-03-12 NOTE — Assessment & Plan Note (Signed)
Patient continues to decline colon cancer screening.

## 2020-03-12 NOTE — Assessment & Plan Note (Signed)
Blood pressure is well controlled today at 133/57.  We will continue with losartan 50 mg daily.  Though his systolic blood pressure is slightly high, his diastolic pressure is a little low.  He enjoys a good active lifestyle, do not want to cause any falls in this anticoagulated person.  Will check BMP today.

## 2020-03-12 NOTE — Assessment & Plan Note (Signed)
Valvular heart disease with aortic stenosis, anticoagulated on Coumadin for primary prevention of cerebral ischemia.  Rate control with carvedilol at 6.25 mg twice daily, heart rate today 75.  A little bit of dyspnea with exertion, but may not be coming from the A. fib.  Tried cardioversion, but did not maintain sinus rhythm.  He will follow up with Dr. Stanford Breed, continue Coumadin and carvedilol for now.

## 2020-03-12 NOTE — Assessment & Plan Note (Signed)
Globin A1c slightly high at 5.7% last year.  Will check again today.

## 2020-03-12 NOTE — Progress Notes (Signed)
   Assessment and Plan:  See Encounters tab for problem-based medical decision making.   __________________________________________________________  HPI:   74 year old person here for follow-up of hypertension and gout.  Doing very well, been about 1 year since I last saw him.  Enjoys a good quality of life at home.  Exercises on occasion, reports a healthy diet.  No difficulties with anticoagulation on Coumadin.  Good adherence to all his medications.  Denies any chest pain or pressure with exertion.  Has some dyspnea on exertion, can walk 2 city blocks without problems.  Feels some shortness of breath with heavy housework.  Reports occasional sinus irritation related to pollen.  __________________________________________________________  Problem List: Patient Active Problem List   Diagnosis Date Noted  . Atrial fibrillation ([place]) 03/04/2019    Priority: High  . Essential hypertension 08/03/2013    Priority: High  . Prediabetes 01/24/2019    Priority: Medium  . Aortic stenosis 09/08/2011    Priority: Medium  . Hyperlipidemia 10/02/2009    Priority: Medium  . CAD (coronary artery disease) 10/02/2009    Priority: Medium  . Flat foot, acquired, left 01/24/2019    Priority: Low  . Gout 01/24/2019    Priority: Low  . Osteoarthritis of both knees 12/12/2014    Priority: Low  . Tubular adenoma 10/07/2013    Priority: Low  . Seborrheic keratosis 10/07/2013    Priority: Low  . Healthcare maintenance 10/07/2013    Priority: Low  . Allergic rhinitis 08/08/2013    Priority: Low  . Sciatica associated with disorder of lumbar spine 08/03/2013    Priority: Low  . Acquired thrombophilia ([place]) 04/22/2019  . Encounter for therapeutic drug monitoring 03/28/2019    Medications: Reconciled today in Epic __________________________________________________________  Physical Exam:  Vital Signs: Vitals:   03/12/20 0834  BP: (!) 133/57  Pulse: 64  Temp: 98.2 F (36.8 C)  TempSrc:  Oral  SpO2: 98%  Weight: 188 lb 11.2 oz (85.6 kg)  Height: 5\' 6"  (1.676 m)    Gen: Well appearing, NAD  Neck: No cervical LAD, No thyromegaly or nodules CV: RRR, 3 out of 6 early systolic murmur at the left upper sternal border Pulm: Normal effort, CTA throughout, no wheezing Abd: Soft, NT, ND Ext: Warm, no edema, normal joints Skin: No atypical appearing moles.  Multiple seborrheic keratoses on his back.  He has some senile purpura on his forearms.  Has some actinic keratoses diffusely throughout his arms and on sun exposed areas.

## 2020-03-12 NOTE — Patient Instructions (Signed)
Is great seeing you today in clinic.  Your blood pressure is under great control.  I am going to check your blood work to see if we need to adjust your gout medicine.  I have ordered a cardiac ultrasound to monitor your aortic valve murmur.  And we talked about your sinus irritation, I recommended using a Nettie pot to do sinus irrigation on a daily basis and then to use Flonase sinus spray as needed for irritation.

## 2020-03-13 LAB — BMP8+ANION GAP
Anion Gap: 16 mmol/L (ref 10.0–18.0)
BUN/Creatinine Ratio: 20 (ref 10–24)
BUN: 24 mg/dL (ref 8–27)
CO2: 19 mmol/L — ABNORMAL LOW (ref 20–29)
Calcium: 8.8 mg/dL (ref 8.6–10.2)
Chloride: 106 mmol/L (ref 96–106)
Creatinine, Ser: 1.22 mg/dL (ref 0.76–1.27)
GFR calc Af Amer: 68 mL/min/{1.73_m2} (ref >59)
GFR calc non Af Amer: 59 mL/min/{1.73_m2} — ABNORMAL LOW (ref >59)
Glucose: 109 mg/dL — ABNORMAL HIGH (ref 65–99)
Potassium: 4.2 mmol/L (ref 3.5–5.2)
Sodium: 141 mmol/L (ref 134–144)

## 2020-03-13 LAB — URIC ACID: Uric Acid: 6.8 mg/dL (ref 3.8–8.4)

## 2020-03-13 MED ORDER — ALLOPURINOL 100 MG PO TABS
200.0000 mg | ORAL_TABLET | Freq: Every day | ORAL | 3 refills | Status: DC
Start: 2020-03-13 — End: 2021-05-27

## 2020-03-13 NOTE — Addendum Note (Signed)
Addended by: Lalla Brothers T on: 03/13/2020 12:40 PM   Modules accepted: Orders

## 2020-03-14 ENCOUNTER — Other Ambulatory Visit: Payer: Self-pay | Admitting: Cardiology

## 2020-03-14 DIAGNOSIS — I10 Essential (primary) hypertension: Secondary | ICD-10-CM

## 2020-03-15 ENCOUNTER — Ambulatory Visit (INDEPENDENT_AMBULATORY_CARE_PROVIDER_SITE_OTHER): Payer: Medicare HMO | Admitting: *Deleted

## 2020-03-15 ENCOUNTER — Other Ambulatory Visit: Payer: Self-pay

## 2020-03-15 DIAGNOSIS — Z5181 Encounter for therapeutic drug level monitoring: Secondary | ICD-10-CM

## 2020-03-15 DIAGNOSIS — I4819 Other persistent atrial fibrillation: Secondary | ICD-10-CM

## 2020-03-15 DIAGNOSIS — I4891 Unspecified atrial fibrillation: Secondary | ICD-10-CM

## 2020-03-15 LAB — POCT INR: INR: 2.5 (ref 2.0–3.0)

## 2020-03-15 NOTE — Patient Instructions (Signed)
Description   Continue taking Warfarin 1/2 tablet daily except 1 tablet on Mondays, Wednesdays and Fridays. Repeat INR in 6 weeks. Please call the coumadin clinic [phone], if you have any changes in your medication or upcoming procedures.

## 2020-03-26 ENCOUNTER — Other Ambulatory Visit: Payer: Self-pay

## 2020-03-26 ENCOUNTER — Ambulatory Visit (HOSPITAL_COMMUNITY)
Admission: RE | Admit: 2020-03-26 | Discharge: 2020-03-26 | Disposition: A | Discharge status: Home / Self Care | Payer: Medicare HMO | Source: Ambulatory Visit | Attending: Student in an Organized Health Care Education/Training Program | Admitting: Student in an Organized Health Care Education/Training Program

## 2020-03-26 DIAGNOSIS — I350 Nonrheumatic aortic (valve) stenosis: Secondary | ICD-10-CM | POA: Insufficient documentation

## 2020-03-26 DIAGNOSIS — I4891 Unspecified atrial fibrillation: Secondary | ICD-10-CM | POA: Insufficient documentation

## 2020-03-26 DIAGNOSIS — I2510 Atherosclerotic heart disease of native coronary artery without angina pectoris: Secondary | ICD-10-CM | POA: Insufficient documentation

## 2020-03-26 DIAGNOSIS — E785 Hyperlipidemia, unspecified: Secondary | ICD-10-CM | POA: Diagnosis not present

## 2020-03-26 DIAGNOSIS — I10 Essential (primary) hypertension: Secondary | ICD-10-CM | POA: Diagnosis not present

## 2020-03-26 LAB — ECHOCARDIOGRAM COMPLETE
AR max vel: 1.29 cm2
AV Area VTI: 1.39 cm2
AV Area mean vel: 1.48 cm2
AV Mean grad: 18.3 mmHg
AV Peak grad: 38.1 mmHg
Ao pk vel: 3.09 m/s
P 1/2 time: 927 msec
S' Lateral: 2.5 cm

## 2020-03-26 NOTE — Progress Notes (Signed)
Echocardiogram 2D Echocardiogram has been performed.  One[place] Deputy Merleen Picazo 03/26/2020, 9:47 AM

## 2020-04-06 ENCOUNTER — Other Ambulatory Visit: Payer: Self-pay | Admitting: Cardiology

## 2020-04-26 ENCOUNTER — Ambulatory Visit (INDEPENDENT_AMBULATORY_CARE_PROVIDER_SITE_OTHER): Payer: Medicare HMO | Admitting: *Deleted

## 2020-04-26 ENCOUNTER — Other Ambulatory Visit: Payer: Self-pay

## 2020-04-26 ENCOUNTER — Telehealth: Payer: Self-pay | Admitting: Student in an Organized Health Care Education/Training Program

## 2020-04-26 DIAGNOSIS — I4819 Other persistent atrial fibrillation: Secondary | ICD-10-CM | POA: Diagnosis not present

## 2020-04-26 DIAGNOSIS — Z5181 Encounter for therapeutic drug level monitoring: Secondary | ICD-10-CM

## 2020-04-26 LAB — POCT INR: INR: 2.8 (ref 2.0–3.0)

## 2020-04-26 MED ORDER — METHYLPREDNISOLONE 4 MG PO TABS: ORAL_TABLET | ORAL | 0 refills | Status: AC

## 2020-04-26 NOTE — Telephone Encounter (Signed)
I ran into the patient this morning in the hallway at the hospital. He reported to me that he is having a flare of sciatica-type pain which is becoming function limiting. Asking for a steroid dose pack which has helped him in the past to improve functioning quickly. It is not my preferred treatment strategy, but has been over 6 months since his last steroid course. Will go ahead with a medrol dose pack and he should follow up with me in the office if this becomes a recurring issue for a better long term treatment plan.

## 2020-04-26 NOTE — Patient Instructions (Signed)
Description   Continue taking Warfarin 1/2 tablet daily except 1 tablet on Mondays, Wednesdays and Fridays. Repeat INR in 7 weeks. Please call the coumadin clinic [phone], if you have any changes in your medication or upcoming procedures.

## 2020-04-28 ENCOUNTER — Ambulatory Visit: Payer: Medicare HMO | Attending: Internal Medicine

## 2020-04-28 DIAGNOSIS — Z23 Encounter for immunization: Secondary | ICD-10-CM

## 2020-04-28 NOTE — Progress Notes (Signed)
   Covid-19 Vaccination Clinic  Name:  [name]    MRN: [id number] DOB: 01-30-1946  04/28/2020  [name] was observed post Covid-19 immunization for 15 minutes without incident. He was provided with Vaccine Information Sheet and instruction to access the V-Safe system.   [name] was instructed to call [phone] with any severe reactions post vaccine: Marland Kitchen Difficulty breathing  . Swelling of face and throat  . A fast heartbeat  . A bad rash all over body  . Dizziness and weakness

## 2020-05-02 NOTE — Progress Notes (Signed)
HPI: FU NICMimproved by most recent echocardiogram. He also has a history of coronary artery disease with cardiomyopathy out of proportion,alcohol use, now resolved and paroxysmal atrial fibrillation. Cardiac catheterization in October of 2007 showed a 20% left main, a 60% mid LAD and a 60-70% distal LAD. There was a 30-40% first diagonal. There was a 30% circumflex and a 40% OM. There was a 30-40% right coronary artery. Ejection fraction was 25-30%. Abdominal ultrasound in February 2012 showed no aneurysm. ABIs October 2017 normal. Patient found to have recurrent atrial fibrillation September 2020 that was asymptomatic. Pt underwent successful cardioversion February 2021. Echocardiogram September 2021 showed normal LV function, mild left ventricular hypertrophy, moderate left atrial enlargement, trace aortic insufficiency, moderate aortic stenosis with mean gradient 18 mmHg.  Since he was last seen, he notes some dyspnea on exertion but no orthopnea, PND, pedal edema, chest pain, palpitations or syncope.  No bleeding.  Current Outpatient Medications  Medication Sig Dispense Refill  . allopurinol (ZYLOPRIM) 100 MG tablet Take 2 tablets (200 mg total) by mouth daily. 180 tablet 3  . atorvastatin (LIPITOR) 40 MG tablet Take 1 tablet (40 mg total) by mouth daily. 90 tablet 3  . carvedilol (COREG) 6.25 MG tablet Take 1 tablet (6.25 mg total) by mouth 2 (two) times daily with a meal. 180 tablet 3  . losartan (COZAAR) 50 MG tablet TAKE 1 TABLET EVERY DAY 90 tablet 3  . warfarin (COUMADIN) 5 MG tablet TAKE 1/2 TO 1 TABLET BY MOUTH DAILY AS DIRECTED BY COUMADIN CLINIC 90 tablet 0   No current facility-administered medications for this visit.     Past Medical History:  Diagnosis Date  . Allergic rhinitis 08/08/2013   takes Loratadine daily   . Aortic stenosis 09/08/2011   With mild aortic regurgitation, mean gradient 13 mmHg   . Cataract    right but immature  . Complication of anesthesia     stopped breathing - during shoulder surgery 2009-2010  . Coronary artery disease 10/02/2009   Cardiac cath (October 2007): 20% left main, 60% mid LAD, 60-70% distal LAD, 30-40% first diagonal, 30% circumflex, 40% OM, 30-40% RCA   . Degenerative joint disease of shoulder 08/03/2013   Bilateral, s/p right total shoulder arthroplasty 05/29/2011 and left shoulder arthroplasty 06/08/2014  . Diastolic dysfunction 54/11/5033   Echo (04/04/2016): Grade I  . Diverticulosis 10/07/2013   Seen on colonoscopy in 2007   . Erectile dysfunction 05/07/2009  . Essential hypertension 08/03/2013   had been on Lisinopril but stopped per MD  . First degree AV block 03/14/2016  . Gastric ulcer 10/07/2013   Seen on EGD 04/10/2006   . Hemorrhoids 10/07/2013  . Hyperlipidemia 10/02/2009  . Paroxysmal atrial fibrillation ([place]) 07/14/2006   One episode, provoked by alcohol use disorder, briefly anticoagulated with warfarin, then switched to aspirin alone  . Sciatica associated with disorder of lumbar spine 08/03/2013   Anterolisthesis with right L 4-5 nerve root compression.  Treated with epidural injections and Physical Therapy.   . Seborrheic keratosis 10/07/2013  . Tubular adenoma of colon     Past Surgical History:  Procedure Laterality Date  . CARDIAC CATHETERIZATION  2007  . CARDIOVERSION    . CARDIOVERSION N/A 08/04/2019   Procedure: CARDIOVERSION;  Surgeon: Fay Records, MD;  Location: [facility] ENDOSCOPY;  Service: Cardiovascular;  Laterality: N/A;  . COLONOSCOPY    . INSERTION OF MESH N/A 03/11/2019   Procedure: Insertion Of Mesh;  Surgeon: Ralene Ok, MD;  Location: [facility] OR;  Service: General;  Laterality: N/A;  . JOINT REPLACEMENT    . KNEE ARTHROSCOPY WITH MENISCAL REPAIR Right 01/30/2011  . right finger surgery     as a child  . TONSILLECTOMY    . TOTAL KNEE ARTHROPLASTY Left 07/28/2017   Procedure: LEFT TOTAL KNEE ARTHROPLASTY;  Surgeon: Renette Butters, MD;  Location: [place];  Service: Orthopedics;  Laterality:  Left;  . TOTAL SHOULDER ARTHROPLASTY  05/29/2011   Procedure: TOTAL SHOULDER ARTHROPLASTY;  Surgeon: Metta Clines Supple;  Location: [place];  Service: Orthopedics;  Laterality: Right;  . TOTAL SHOULDER ARTHROPLASTY Left 06/08/2014   DR SUPPLE  . TOTAL SHOULDER ARTHROPLASTY Left 06/08/2014   Procedure: LEFT TOTAL SHOULDER ARTHROPLASTY;  Surgeon: Marin Shutter, MD;  Location: [place];  Service: Orthopedics;  Laterality: Left;  . UMBILICAL HERNIA REPAIR N/A 03/11/2019   Procedure: LAPAROSCOPIC UMBILICAL HERNIA;  Surgeon: Ralene Ok, MD;  Location: [place];  Service: General;  Laterality: N/A;    Social History   Socioeconomic History  . Marital status: Married    Spouse name: Not on file  . Number of children: Not on file  . Years of education: Not on file  . Highest education level: Not on file  Occupational History  . Not on file  Tobacco Use  . Smoking status: Former Smoker    Packs/day: 1.50    Years: 42.00    Pack years: 63.00    Types: Cigarettes    Start date: 38    Quit date: 07/14/2010    Years since quitting: 9.8  . Smokeless tobacco: Never Used  . Tobacco comment: quit smoking about 5yrs ago  Vaping Use  . Vaping Use: Never used  Substance and Sexual Activity  . Alcohol use: No    Comment: no alcohol since 07  . Drug use: No  . Sexual activity: Never    Birth control/protection: None  Other Topics Concern  . Not on file  [place] with wife and dog.  Former Teacher, early years/pre.   Social Determinants of Health   Financial Resource Strain:   . Difficulty of Paying Living Expenses: Not on file  Food Insecurity:   . Worried About Charity fundraiser in the Last Year: Not on file  . Ran Out of Food in the Last Year: Not on file  Transportation Needs:   . Lack of Transportation (Medical): Not on file  . Lack of Transportation (Non-Medical): Not on file  Physical Activity:   . Days of Exercise per  Week: Not on file  . Minutes of Exercise per Session: Not on file  Stress:   . Feeling of Stress : Not on file  Social Connections:   . Frequency of Communication with Friends and Family: Not on file  . Frequency of Social Gatherings with Friends and Family: Not on file  . Attends Religious Services: Not on file  . Active Member of Clubs or Organizations: Not on file  . Attends Archivist Meetings: Not on file  . Marital Status: Not on file  Intimate Partner Violence:   . Fear of Current or Ex-Partner: Not on file  . Emotionally Abused: Not on file  . Physically Abused: Not on file  . Sexually Abused: Not on file    Family History  Problem Relation Age of Onset  . Coronary artery disease Father 7       Died  of myocardial infarction  . Bradycardia Mother 41       Requiring pacemeaker placement  . Coronary artery disease Brother 70       Died of myocardial infarction  . Obesity Daughter   . Healthy Son     ROS: Back pain but no fevers or chills, productive cough, hemoptysis, dysphasia, odynophagia, melena, hematochezia, dysuria, hematuria, rash, seizure activity, orthopnea, PND, pedal edema, claudication. Remaining systems are negative.  Physical Exam: Well-developed well-nourished in no acute distress.  Skin is warm and dry.  HEENT is normal.  Neck is supple.  Chest is clear to auscultation with normal expansion.  Cardiovascular exam is irregular, 2/6 systolic murmur left sternal border.  S2 is not diminished. Abdominal exam nontender or distended. No masses palpated. Extremities show no edema. neuro grossly intact  ECG-atrial fibrillation at a rate of 72, normal axis, no ST changes.  Personally reviewed  A/P  1 aortic stenosis-patient will need follow-up echocardiogram September 2022.  Presently asymptomatic.  2 permanent atrial fibrillation-plan is rate control and anticoagulation.  Continue carvedilol and Coumadin (patient has declined DOAC's in the past  due to expense).  3 hypertension-patient's blood pressure is controlled.  Continue present medications and follow.  4 hyperlipidemia-continue statin.  5 coronary artery disease-patient denies chest pain.  Continue statin.  He is not on aspirin given need for long-term anticoagulation.  6 history of cardiomyopathy-LV function has normalized on most recent echocardiogram.  Continue ARB and beta-blocker.  Kirk Ruths, MD

## 2020-05-10 ENCOUNTER — Encounter: Payer: Self-pay | Admitting: Cardiology

## 2020-05-10 ENCOUNTER — Ambulatory Visit: Payer: Medicare HMO | Admitting: Cardiology

## 2020-05-10 VITALS — BP 117/76 | HR 72 | Temp 96.3°F | Ht 66.0 in | Wt 186.0 lb

## 2020-05-10 DIAGNOSIS — I4891 Unspecified atrial fibrillation: Secondary | ICD-10-CM

## 2020-05-10 DIAGNOSIS — I25119 Atherosclerotic heart disease of native coronary artery with unspecified angina pectoris: Secondary | ICD-10-CM | POA: Diagnosis not present

## 2020-05-10 DIAGNOSIS — I10 Essential (primary) hypertension: Secondary | ICD-10-CM | POA: Diagnosis not present

## 2020-05-10 DIAGNOSIS — E785 Hyperlipidemia, unspecified: Secondary | ICD-10-CM | POA: Diagnosis not present

## 2020-05-10 DIAGNOSIS — I350 Nonrheumatic aortic (valve) stenosis: Secondary | ICD-10-CM | POA: Diagnosis not present

## 2020-05-10 NOTE — Patient Instructions (Signed)

## 2020-06-05 ENCOUNTER — Other Ambulatory Visit: Payer: Self-pay | Admitting: Student in an Organized Health Care Education/Training Program

## 2020-06-05 DIAGNOSIS — E7800 Pure hypercholesterolemia, unspecified: Secondary | ICD-10-CM

## 2020-06-13 ENCOUNTER — Other Ambulatory Visit: Payer: Self-pay | Admitting: Cardiology

## 2020-06-14 ENCOUNTER — Other Ambulatory Visit: Payer: Self-pay

## 2020-06-14 ENCOUNTER — Ambulatory Visit (INDEPENDENT_AMBULATORY_CARE_PROVIDER_SITE_OTHER): Payer: Medicare HMO | Admitting: *Deleted

## 2020-06-14 DIAGNOSIS — I4819 Other persistent atrial fibrillation: Secondary | ICD-10-CM

## 2020-06-14 DIAGNOSIS — Z5181 Encounter for therapeutic drug level monitoring: Secondary | ICD-10-CM

## 2020-06-14 LAB — POCT INR: INR: 2.8 (ref 2.0–3.0)

## 2020-06-14 NOTE — Patient Instructions (Signed)
Description   Continue taking Warfarin 1/2 tablet daily except 1 tablet on Mondays, Wednesdays and Fridays. Repeat INR in 8 weeks. Please call the coumadin clinic [phone], if you have any changes in your medication or upcoming procedures.      

## 2020-06-20 ENCOUNTER — Other Ambulatory Visit: Payer: Self-pay | Admitting: Cardiology

## 2020-06-20 DIAGNOSIS — I25119 Atherosclerotic heart disease of native coronary artery with unspecified angina pectoris: Secondary | ICD-10-CM

## 2020-06-30 HISTORY — PX: EYE SURGERY: SHX253

## 2020-07-18 DIAGNOSIS — H40023 Open angle with borderline findings, high risk, bilateral: Secondary | ICD-10-CM | POA: Diagnosis not present

## 2020-07-18 DIAGNOSIS — H43812 Vitreous degeneration, left eye: Secondary | ICD-10-CM | POA: Diagnosis not present

## 2020-08-09 ENCOUNTER — Other Ambulatory Visit: Payer: Self-pay

## 2020-08-09 ENCOUNTER — Ambulatory Visit (INDEPENDENT_AMBULATORY_CARE_PROVIDER_SITE_OTHER): Payer: Medicare HMO

## 2020-08-09 DIAGNOSIS — Z5181 Encounter for therapeutic drug level monitoring: Secondary | ICD-10-CM

## 2020-08-09 DIAGNOSIS — I4819 Other persistent atrial fibrillation: Secondary | ICD-10-CM | POA: Diagnosis not present

## 2020-08-09 LAB — POCT INR: INR: 2.6 (ref 2.0–3.0)

## 2020-08-09 NOTE — Patient Instructions (Signed)
Description   Continue taking Warfarin 1/2 tablet daily except 1 tablet on Mondays, Wednesdays and Fridays. Repeat INR in 8 weeks. Please call the coumadin clinic [phone], if you have any changes in your medication or upcoming procedures.

## 2020-09-05 ENCOUNTER — Other Ambulatory Visit: Payer: Self-pay | Admitting: Cardiology

## 2020-10-04 ENCOUNTER — Ambulatory Visit (INDEPENDENT_AMBULATORY_CARE_PROVIDER_SITE_OTHER): Payer: Medicare HMO | Admitting: *Deleted

## 2020-10-04 ENCOUNTER — Other Ambulatory Visit: Payer: Self-pay

## 2020-10-04 DIAGNOSIS — I4819 Other persistent atrial fibrillation: Secondary | ICD-10-CM | POA: Diagnosis not present

## 2020-10-04 DIAGNOSIS — Z5181 Encounter for therapeutic drug level monitoring: Secondary | ICD-10-CM | POA: Diagnosis not present

## 2020-10-04 LAB — POCT INR: INR: 1.8 — AB (ref 2.0–3.0)

## 2020-10-04 NOTE — Patient Instructions (Signed)
Description   Take 1 tablet today, then  continue taking Warfarin 1/2 tablet daily except 1 tablet on Mondays, Wednesdays and Fridays. Repeat INR in 4 weeks. Please call the coumadin clinic [phone], if you have any changes in your medication or upcoming procedures.

## 2020-10-23 ENCOUNTER — Other Ambulatory Visit: Payer: Self-pay | Admitting: Student in an Organized Health Care Education/Training Program

## 2020-10-23 DIAGNOSIS — E7800 Pure hypercholesterolemia, unspecified: Secondary | ICD-10-CM

## 2020-10-31 ENCOUNTER — Ambulatory Visit (INDEPENDENT_AMBULATORY_CARE_PROVIDER_SITE_OTHER): Payer: Medicare HMO

## 2020-10-31 ENCOUNTER — Other Ambulatory Visit: Payer: Self-pay

## 2020-10-31 DIAGNOSIS — I4819 Other persistent atrial fibrillation: Secondary | ICD-10-CM

## 2020-10-31 DIAGNOSIS — Z5181 Encounter for therapeutic drug level monitoring: Secondary | ICD-10-CM | POA: Diagnosis not present

## 2020-10-31 LAB — POCT INR: INR: 2.4 (ref 2.0–3.0)

## 2020-10-31 NOTE — Patient Instructions (Signed)
Description   Continue on same dosage of Warfarin 1/2 tablet daily except 1 tablet on Mondays, Wednesdays and Fridays. Repeat INR in 6 weeks. Please call the coumadin clinic [phone], if you have any changes in your medication or upcoming procedures.

## 2020-11-01 ENCOUNTER — Encounter: Payer: Self-pay | Admitting: *Deleted

## 2020-11-01 NOTE — Progress Notes (Unsigned)

## 2020-11-02 DIAGNOSIS — M545 Low back pain, unspecified: Secondary | ICD-10-CM | POA: Diagnosis not present

## 2020-11-02 DIAGNOSIS — M25561 Pain in right knee: Secondary | ICD-10-CM | POA: Diagnosis not present

## 2020-11-02 NOTE — Progress Notes (Unsigned)
Things That May Be Affecting Your Health:  Alcohol  Hearing loss  Pain    Depression  Home Safety  Sexual Health   Diabetes x Lack of physical activity  Stress   Difficulty with daily activities  Loneliness  Tiredness   Drug use  Medicines  Tobacco use   Falls  Motor Vehicle Safety  Weight  x Food choices  Oral Health  Other    YOUR PERSONALIZED HEALTH PLAN : 1. Schedule your next subsequent Medicare Wellness visit in one year 2. Attend all of your regular appointments to address your medical issues 3. Complete the preventative screenings and services   Annual Wellness Visit   Medicare Covered Preventative Screenings and [place] Men and Women Who How Often Need? Date of Last Service Action  Abdominal Aortic Aneurysm Adults with AAA risk factors Once      Alcohol Misuse and Counseling All Adults Screening once a year if no alcohol misuse. Counseling up to 4 face to face sessions.     Bone Density Measurement  Adults at risk for osteoporosis Once every 2 yrs      Lipid Panel Z13.6 All adults without CV disease Once every 5 yrs       Colorectal Cancer   Stool sample or  Colonoscopy All adults 67 and older   Once every year  Every 10 years Yes       Depression All Adults Once a year  Today   Diabetes Screening Blood glucose, post glucose load, or GTT Z13.1  All adults at risk  Pre-diabetics  Once per year  Twice per year      Diabetes  Self-Management Training All adults Diabetics 10 hrs first year; 2 hours subsequent years. Requires Copay     Glaucoma  Diabetics  Family history of glaucoma  African Americans 81 yrs +  Hispanic Americans 43 yrs + Annually - requires coppay      Hepatitis C Z72.89 or F19.20  High Risk for HCV  Born between 1945 and 1965  Annually  Once      HIV Z11.4 All adults based on risk  Annually btw ages 53 & 48 regardless of risk  Annually > 65 yrs if at increased risk      Lung Cancer Screening  Asymptomatic adults aged 16-77 with 30 pack yr history and current smoker OR quit within the last 15 yrs Annually Must have counseling and shared decision making documentation before first screen      Medical Nutrition Therapy Adults with   Diabetes  Renal disease  Kidney transplant within past 3 yrs 3 hours first year; 2 hours subsequent years     Obesity and Counseling All adults Screening once a year Counseling if BMI 30 or higher  Today   Tobacco Use Counseling Adults who use tobacco  Up to 8 visits in one year     Vaccines Z23  Hepatitis B  Influenza   Pneumonia  Adults   Once  Once every flu season  Two different vaccines separated by one year     Next Annual Wellness Visit People with Medicare Every year  Today     Services & Screenings Women Who How Often Need  Date of Last Service Action  Mammogram  Z12.31 Women over 55 One baseline ages 72-39. Annually ager 40 yrs+      Pap tests All women Annually if high risk. Every 2 yrs for normal risk women  Screening for cervical cancer with   Pap (Z01.419 nl or Z01.411abnl) &  HPV Z11.51 Women aged 38 to 65 Once every 5 yrs     Screening pelvic and breast exams All women Annually if high risk. Every 2 yrs for normal risk women     Sexually Transmitted Diseases  Chlamydia  Gonorrhea  Syphilis All at risk adults Annually for non pregnant females at increased risk         [place] Men Who How Ofter Need  Date of Last Service Action  Prostate Cancer - DRE & PSA Men over 50 Annually.  DRE might require a copay.        Sexually Transmitted Diseases  Syphilis All at risk adults Annually for men at increased risk      Health Maintenance List Health Maintenance  Topic Date Due  . COLONOSCOPY (Pts 45-106yrs Insurance coverage will need to be confirmed)  04/11/2011  . COVID-19 Vaccine (4 - Booster for Pfizer series) 10/27/2020  . INFLUENZA VACCINE  01/28/2021  . TETANUS/TDAP  03/14/2026  .  Hepatitis C Screening  Completed  . PNA vac Low Risk Adult  Completed  . HPV VACCINES  Aged Out

## 2020-12-12 ENCOUNTER — Other Ambulatory Visit: Payer: Self-pay

## 2020-12-12 ENCOUNTER — Ambulatory Visit (INDEPENDENT_AMBULATORY_CARE_PROVIDER_SITE_OTHER): Payer: Medicare HMO

## 2020-12-12 DIAGNOSIS — I4819 Other persistent atrial fibrillation: Secondary | ICD-10-CM

## 2020-12-12 DIAGNOSIS — Z5181 Encounter for therapeutic drug level monitoring: Secondary | ICD-10-CM | POA: Diagnosis not present

## 2020-12-12 LAB — POCT INR: INR: 1.8 — AB (ref 2.0–3.0)

## 2020-12-12 NOTE — Patient Instructions (Signed)
Description   Take an extra 1/2 tablet today, then resume same dosage of Warfarin 1/2 tablet daily except 1 tablet on Mondays, Wednesdays and Fridays. Repeat INR in 4 weeks. Please call the coumadin clinic [phone], if you have any changes in your medication or upcoming procedures.

## 2021-01-01 ENCOUNTER — Encounter: Payer: Self-pay | Admitting: *Deleted

## 2021-01-09 ENCOUNTER — Other Ambulatory Visit: Payer: Self-pay

## 2021-01-09 ENCOUNTER — Ambulatory Visit (INDEPENDENT_AMBULATORY_CARE_PROVIDER_SITE_OTHER): Payer: Medicare HMO | Admitting: *Deleted

## 2021-01-09 DIAGNOSIS — I4819 Other persistent atrial fibrillation: Secondary | ICD-10-CM

## 2021-01-09 DIAGNOSIS — Z5181 Encounter for therapeutic drug level monitoring: Secondary | ICD-10-CM | POA: Diagnosis not present

## 2021-01-09 LAB — POCT INR: INR: 1.6 — AB (ref 2.0–3.0)

## 2021-01-09 NOTE — Patient Instructions (Signed)
Description   Take 1.5 tablets today and then start taking 1 tablet daily except for 1/2 a tablet on Sunday, Tuesday and Thursday. Recheck INR in 2 weeks. Coumadin Clinic [phone].

## 2021-01-23 ENCOUNTER — Ambulatory Visit (INDEPENDENT_AMBULATORY_CARE_PROVIDER_SITE_OTHER): Payer: Medicare HMO

## 2021-01-23 ENCOUNTER — Other Ambulatory Visit: Payer: Self-pay

## 2021-01-23 DIAGNOSIS — Z5181 Encounter for therapeutic drug level monitoring: Secondary | ICD-10-CM

## 2021-01-23 DIAGNOSIS — I4819 Other persistent atrial fibrillation: Secondary | ICD-10-CM

## 2021-01-23 LAB — POCT INR: INR: 2.7 (ref 2.0–3.0)

## 2021-01-23 NOTE — Patient Instructions (Signed)
Description   Continue taking 1 tablet daily except for 1/2 a tablet on Sunday, Tuesday and Thursday. Recheck INR in 4 weeks. Coumadin Clinic [phone].

## 2021-02-20 ENCOUNTER — Other Ambulatory Visit: Payer: Self-pay

## 2021-02-20 ENCOUNTER — Ambulatory Visit (INDEPENDENT_AMBULATORY_CARE_PROVIDER_SITE_OTHER): Payer: Medicare HMO | Admitting: *Deleted

## 2021-02-20 DIAGNOSIS — Z5181 Encounter for therapeutic drug level monitoring: Secondary | ICD-10-CM | POA: Diagnosis not present

## 2021-02-20 DIAGNOSIS — I4819 Other persistent atrial fibrillation: Secondary | ICD-10-CM | POA: Diagnosis not present

## 2021-02-20 LAB — POCT INR: INR: 3.7 — AB (ref 2.0–3.0)

## 2021-02-20 NOTE — Patient Instructions (Addendum)
Description   Do not take any Warfarin tomorrow (already taken today's dose) then continue taking 1 tablet daily except for 1/2 tablet on Sunday, Tuesday and Thursday. Have a leafy veggie today & remain consistent. Recheck INR in 3 weeks. Coumadin Clinic [phone].

## 2021-03-13 ENCOUNTER — Ambulatory Visit (INDEPENDENT_AMBULATORY_CARE_PROVIDER_SITE_OTHER): Payer: Medicare HMO

## 2021-03-13 ENCOUNTER — Other Ambulatory Visit: Payer: Self-pay

## 2021-03-13 DIAGNOSIS — I4819 Other persistent atrial fibrillation: Secondary | ICD-10-CM | POA: Diagnosis not present

## 2021-03-13 DIAGNOSIS — Z5181 Encounter for therapeutic drug level monitoring: Secondary | ICD-10-CM

## 2021-03-13 LAB — POCT INR: INR: 2.8 (ref 2.0–3.0)

## 2021-03-13 NOTE — Patient Instructions (Signed)
Description   Continue on same dosage 1 tablet daily except for 1/2 tablet on Sundays, Tuesdays and Thursdays. Have a leafy veggie today & remain consistent. Recheck INR in 4 weeks. Coumadin Clinic [phone].

## 2021-03-20 ENCOUNTER — Other Ambulatory Visit: Payer: Self-pay | Admitting: Cardiology

## 2021-03-20 DIAGNOSIS — I10 Essential (primary) hypertension: Secondary | ICD-10-CM

## 2021-04-08 ENCOUNTER — Ambulatory Visit (INDEPENDENT_AMBULATORY_CARE_PROVIDER_SITE_OTHER): Payer: Medicare HMO | Admitting: *Deleted

## 2021-04-08 DIAGNOSIS — Z23 Encounter for immunization: Secondary | ICD-10-CM

## 2021-04-08 DIAGNOSIS — M25561 Pain in right knee: Secondary | ICD-10-CM | POA: Diagnosis not present

## 2021-04-08 DIAGNOSIS — M545 Low back pain, unspecified: Secondary | ICD-10-CM | POA: Diagnosis not present

## 2021-04-10 ENCOUNTER — Other Ambulatory Visit: Payer: Self-pay

## 2021-04-10 ENCOUNTER — Ambulatory Visit (INDEPENDENT_AMBULATORY_CARE_PROVIDER_SITE_OTHER): Payer: Medicare HMO

## 2021-04-10 DIAGNOSIS — Z5181 Encounter for therapeutic drug level monitoring: Secondary | ICD-10-CM | POA: Diagnosis not present

## 2021-04-10 DIAGNOSIS — I4819 Other persistent atrial fibrillation: Secondary | ICD-10-CM | POA: Diagnosis not present

## 2021-04-10 LAB — POCT INR: INR: 3.4 — AB (ref 2.0–3.0)

## 2021-04-10 NOTE — Patient Instructions (Signed)
Description   Start taking 1/2 tablet daily except for 1 tablet on Mondays, Wednesdays and Fridays. Have a leafy veggie today & remain consistent. Recheck INR in 3 weeks. Coumadin Clinic [phone].

## 2021-04-17 ENCOUNTER — Other Ambulatory Visit: Payer: Self-pay | Admitting: Orthopedic Surgery

## 2021-04-17 DIAGNOSIS — M545 Low back pain, unspecified: Secondary | ICD-10-CM

## 2021-04-26 ENCOUNTER — Ambulatory Visit
Admission: RE | Admit: 2021-04-26 | Discharge: 2021-04-26 | Disposition: A | Discharge status: Home / Self Care | Payer: Medicare HMO | Source: Ambulatory Visit | Attending: Orthopedic Surgery | Admitting: Orthopedic Surgery

## 2021-04-26 DIAGNOSIS — M48061 Spinal stenosis, lumbar region without neurogenic claudication: Secondary | ICD-10-CM | POA: Diagnosis not present

## 2021-04-26 DIAGNOSIS — M545 Low back pain, unspecified: Secondary | ICD-10-CM

## 2021-05-01 ENCOUNTER — Other Ambulatory Visit: Payer: Self-pay

## 2021-05-01 ENCOUNTER — Ambulatory Visit (INDEPENDENT_AMBULATORY_CARE_PROVIDER_SITE_OTHER): Payer: Medicare HMO

## 2021-05-01 DIAGNOSIS — I4819 Other persistent atrial fibrillation: Secondary | ICD-10-CM

## 2021-05-01 DIAGNOSIS — Z5181 Encounter for therapeutic drug level monitoring: Secondary | ICD-10-CM

## 2021-05-01 DIAGNOSIS — M25561 Pain in right knee: Secondary | ICD-10-CM | POA: Diagnosis not present

## 2021-05-01 LAB — POCT INR: INR: 1.6 — AB (ref 2.0–3.0)

## 2021-05-01 NOTE — Patient Instructions (Signed)
Description   Take an extra 1/2 tablet today, then start taking 1 tablet daily except for 1/2 tablet on Sundays, Tuesdays and Thursdays.  Recheck INR in 2 weeks. Coumadin Clinic [phone].

## 2021-05-08 ENCOUNTER — Other Ambulatory Visit: Payer: Self-pay | Admitting: Cardiology

## 2021-05-08 DIAGNOSIS — I25119 Atherosclerotic heart disease of native coronary artery with unspecified angina pectoris: Secondary | ICD-10-CM

## 2021-05-14 ENCOUNTER — Ambulatory Visit (INDEPENDENT_AMBULATORY_CARE_PROVIDER_SITE_OTHER): Payer: Medicare HMO | Admitting: *Deleted

## 2021-05-14 ENCOUNTER — Other Ambulatory Visit: Payer: Self-pay

## 2021-05-14 DIAGNOSIS — Z5181 Encounter for therapeutic drug level monitoring: Secondary | ICD-10-CM | POA: Diagnosis not present

## 2021-05-14 DIAGNOSIS — I4819 Other persistent atrial fibrillation: Secondary | ICD-10-CM | POA: Diagnosis not present

## 2021-05-14 LAB — POCT INR: INR: 3.3 — AB (ref 2.0–3.0)

## 2021-05-14 NOTE — Patient Instructions (Signed)
Description   Tomorrow take 1/2 tablet then continue  taking 1 tablet daily except for 1/2 tablet on Sundays, Tuesdays and Thursdays. Recheck INR in 2 weeks. Coumadin Clinic [phone].

## 2021-05-25 ENCOUNTER — Other Ambulatory Visit: Payer: Self-pay | Admitting: Student in an Organized Health Care Education/Training Program

## 2021-05-25 ENCOUNTER — Other Ambulatory Visit: Payer: Self-pay | Admitting: Cardiology

## 2021-05-29 ENCOUNTER — Ambulatory Visit (INDEPENDENT_AMBULATORY_CARE_PROVIDER_SITE_OTHER): Payer: Medicare HMO | Admitting: *Deleted

## 2021-05-29 ENCOUNTER — Other Ambulatory Visit: Payer: Self-pay

## 2021-05-29 DIAGNOSIS — I4819 Other persistent atrial fibrillation: Secondary | ICD-10-CM | POA: Diagnosis not present

## 2021-05-29 DIAGNOSIS — Z5181 Encounter for therapeutic drug level monitoring: Secondary | ICD-10-CM | POA: Diagnosis not present

## 2021-05-29 LAB — POCT INR: INR: 3.1 — AB (ref 2.0–3.0)

## 2021-05-29 NOTE — Patient Instructions (Signed)
Description   Have some leafy veggies today then start taking 1/2 tablet daily except for 1 tablet on Monday, Wednesday, and Friday. Recheck INR in 3 weeks. Coumadin Clinic [phone].

## 2021-06-03 DIAGNOSIS — M5416 Radiculopathy, lumbar region: Secondary | ICD-10-CM | POA: Diagnosis not present

## 2021-06-19 ENCOUNTER — Other Ambulatory Visit: Payer: Self-pay

## 2021-06-19 ENCOUNTER — Ambulatory Visit (INDEPENDENT_AMBULATORY_CARE_PROVIDER_SITE_OTHER): Payer: Medicare HMO | Admitting: *Deleted

## 2021-06-19 DIAGNOSIS — I4819 Other persistent atrial fibrillation: Secondary | ICD-10-CM

## 2021-06-19 DIAGNOSIS — M5416 Radiculopathy, lumbar region: Secondary | ICD-10-CM | POA: Diagnosis not present

## 2021-06-19 DIAGNOSIS — Z5181 Encounter for therapeutic drug level monitoring: Secondary | ICD-10-CM

## 2021-06-19 LAB — POCT INR: INR: 1.4 — AB (ref 2.0–3.0)

## 2021-06-19 NOTE — Patient Instructions (Signed)
Description   Today take another 1/2 tablet and tomorrow take 1 tablet then continue taking 1/2 tablet daily except for 1 tablet on Monday, Wednesday, and Friday. Recheck INR in 1 week. Coumadin Clinic [phone].

## 2021-06-28 ENCOUNTER — Other Ambulatory Visit: Payer: Self-pay

## 2021-06-28 ENCOUNTER — Ambulatory Visit (INDEPENDENT_AMBULATORY_CARE_PROVIDER_SITE_OTHER): Payer: Medicare HMO | Admitting: *Deleted

## 2021-06-28 DIAGNOSIS — I4819 Other persistent atrial fibrillation: Secondary | ICD-10-CM

## 2021-06-28 DIAGNOSIS — Z5181 Encounter for therapeutic drug level monitoring: Secondary | ICD-10-CM | POA: Diagnosis not present

## 2021-06-28 LAB — POCT INR: INR: 2.7 (ref 2.0–3.0)

## 2021-06-28 NOTE — Patient Instructions (Signed)
Description   Continue taking 1/2 tablet daily except for 1 tablet on Monday, Wednesday, and Friday. Recheck INR in 2 week. Coumadin Clinic [phone].

## 2021-07-10 ENCOUNTER — Ambulatory Visit (INDEPENDENT_AMBULATORY_CARE_PROVIDER_SITE_OTHER): Payer: Medicare HMO | Admitting: *Deleted

## 2021-07-10 ENCOUNTER — Other Ambulatory Visit: Payer: Self-pay

## 2021-07-10 DIAGNOSIS — I4819 Other persistent atrial fibrillation: Secondary | ICD-10-CM | POA: Diagnosis not present

## 2021-07-10 DIAGNOSIS — Z5181 Encounter for therapeutic drug level monitoring: Secondary | ICD-10-CM | POA: Diagnosis not present

## 2021-07-10 LAB — POCT INR: INR: 2.7 (ref 2.0–3.0)

## 2021-07-10 NOTE — Patient Instructions (Signed)
Description   Continue taking 1/2 tablet daily except for 1 tablet on Monday, Wednesday, and Friday. Recheck INR in 4 weeks. Coumadin Clinic [phone].

## 2021-07-29 ENCOUNTER — Ambulatory Visit (INDEPENDENT_AMBULATORY_CARE_PROVIDER_SITE_OTHER): Payer: Medicare HMO | Admitting: Student in an Organized Health Care Education/Training Program

## 2021-07-29 ENCOUNTER — Encounter: Payer: Self-pay | Admitting: Student in an Organized Health Care Education/Training Program

## 2021-07-29 VITALS — BP 136/62 | HR 87 | Temp 97.8°F | Ht 66.0 in | Wt 187.6 lb

## 2021-07-29 DIAGNOSIS — E7800 Pure hypercholesterolemia, unspecified: Secondary | ICD-10-CM | POA: Diagnosis not present

## 2021-07-29 DIAGNOSIS — M1A0720 Idiopathic chronic gout, left ankle and foot, without tophus (tophi): Secondary | ICD-10-CM | POA: Diagnosis not present

## 2021-07-29 DIAGNOSIS — I10 Essential (primary) hypertension: Secondary | ICD-10-CM

## 2021-07-29 DIAGNOSIS — Z125 Encounter for screening for malignant neoplasm of prostate: Secondary | ICD-10-CM

## 2021-07-29 DIAGNOSIS — I350 Nonrheumatic aortic (valve) stenosis: Secondary | ICD-10-CM

## 2021-07-29 DIAGNOSIS — I4891 Unspecified atrial fibrillation: Secondary | ICD-10-CM | POA: Diagnosis not present

## 2021-07-29 DIAGNOSIS — R7303 Prediabetes: Secondary | ICD-10-CM

## 2021-07-29 DIAGNOSIS — R0609 Other forms of dyspnea: Secondary | ICD-10-CM | POA: Insufficient documentation

## 2021-07-29 LAB — POCT GLYCOSYLATED HEMOGLOBIN (HGB A1C): Hemoglobin A1C: 5.7 % — AB (ref 4.0–5.6)

## 2021-07-29 LAB — GLUCOSE, CAPILLARY: Glucose-Capillary: 123 mg/dL — ABNORMAL HIGH (ref 70–99)

## 2021-07-29 NOTE — Patient Instructions (Signed)
For your shortness of breath, we are going to check blood work today, check a cardiac ultrasound, and do lung function tests. I will call you when I have those results with the next steps.

## 2021-07-29 NOTE — Assessment & Plan Note (Signed)
Positive history for dyspnea on exertion the last few months, no chest pain and no presyncope symptoms. Enjoys a great quality of life. BP is under good control, slightly wide pulse pressure. His murmur sounds unchanged. Plan will be for repeat echo and he will follow up with Dr. Stanford Breed. Likely will reach a point of needing some intervention on the valve in the coming years.

## 2021-07-29 NOTE — Assessment & Plan Note (Signed)
Increasing dyspnea on exertion lately, though not function limiting. No other red flags. Will check CBC today to rule out anemia. Has aortic stenosis that needs follow up echo. He has a 40 pack year history of tobacco use, quit in 2014. He reports PFTs many years ago, I do not have those records. Reports some intermittent use of an inhaler many years ago, though no clear diagnosis of obstructive lung disease. Will order spirometry, lung volumes, DLCO, and 6 minute walk test to better evaluate lung function.

## 2021-07-29 NOTE — Progress Notes (Signed)
° °  Assessment and Plan:  See Encounters tab for problem-based medical decision making.   __________________________________________________________  HPI:   76 year old person here for follow-up of hypertension and has a chief concern of increasing dyspnea on exertion the last few months.  Patient reports doing fairly well the last year, its been over 1 year since he is followed up with me in the office.  No recent hospitalizations, no surgeries, he has had some steroid injections for lumbar spine osteoarthritis.  Reports his back pain is stable today and not function limiting.  He reports feeling short of breath after walking approximately 2 city blocks, sometimes struggles to walk up a flight of stairs.  Denies chest pain, pressure, or lightheadedness.  No cough or sputum production.  His dyspnea has been on and off for many years, has used some inhaler in the past but nothing recently.  He reports a possible history of asthma, no formal diagnosis of COPD, prior tobacco use but quit in 2014, about a 40-pack-year history of tobacco.  No recent weight loss.  Otherwise he reports that he is doing well, and lives independently, enjoys a good exertional capacity, goes to the gym almost every day.  His exercise has been somewhat limited by his knee and back pain, and he wonders if he may be more deconditioned as a result.  __________________________________________________________  Problem List: Patient Active Problem List   Diagnosis Date Noted   Atrial fibrillation ([place]) 03/04/2019    Priority: High   Essential hypertension 08/03/2013    Priority: High   Prediabetes 01/24/2019    Priority: Medium    Aortic stenosis 09/08/2011    Priority: Medium    Hyperlipidemia 10/02/2009    Priority: Medium    CAD (coronary artery disease) 10/02/2009    Priority: Medium    Flat foot, acquired, left 01/24/2019    Priority: Low   Gout 01/24/2019    Priority: Low   Osteoarthritis of both knees  12/12/2014    Priority: Low   Tubular adenoma 10/07/2013    Priority: Low   Seborrheic keratosis 10/07/2013    Priority: [place] maintenance 10/07/2013    Priority: Low   Allergic rhinitis 08/08/2013    Priority: Low   Sciatica associated with disorder of lumbar spine 08/03/2013    Priority: Low   Dyspnea on exertion 07/29/2021   Acquired thrombophilia ([place]) 04/22/2019   Encounter for therapeutic drug monitoring 03/28/2019    Medications: Reconciled today in Epic __________________________________________________________  Physical Exam:  Vital Signs: Vitals:   07/29/21 0900  BP: 136/62  Pulse: 87  Temp: 97.8 F (36.6 C)  TempSrc: Oral  SpO2: 98%  Weight: 187 lb 9.6 oz (85.1 kg)  Height: 5\' 6"  (1.676 m)    Gen: Well appearing, NAD Neck: No cervical LAD, No thyromegaly or nodules, No JVD. CV: RRR, no murmurs Pulm: Normal effort, CTA throughout, no wheezing Abd: Soft, NT, ND Ext: Warm, no edema, normal joints

## 2021-07-29 NOTE — Assessment & Plan Note (Addendum)
Valvular a fib that is asymptomatic, rate controlled on carvedilol 6.25mg  bid. He is anticoagulated with warfarin, managed by the anticoag clinic in [facility].

## 2021-07-29 NOTE — Assessment & Plan Note (Signed)
Will plan to check lipids today, continue with atorvastatin 40mg  daily for primary prevention of ischemic event.

## 2021-07-29 NOTE — Assessment & Plan Note (Signed)
History of slightly high A1c, mild obesity and hyperlipidemia. Has had a few steroid injections to lumbar spine, and a few medrol dose packs, so at risk for progressing to diabetes. Will check A1c today.

## 2021-07-29 NOTE — Assessment & Plan Note (Signed)
Blood pressure well controlled. Plan to continue losartan 50mg  daily, check BMP today.

## 2021-07-30 ENCOUNTER — Encounter: Payer: Self-pay | Admitting: Student in an Organized Health Care Education/Training Program

## 2021-07-30 LAB — BMP8+ANION GAP
Anion Gap: 13 mmol/L (ref 10.0–18.0)
BUN/Creatinine Ratio: 16 (ref 10–24)
BUN: 20 mg/dL (ref 8–27)
CO2: 23 mmol/L (ref 20–29)
Calcium: 9.4 mg/dL (ref 8.6–10.2)
Chloride: 106 mmol/L (ref 96–106)
Creatinine, Ser: 1.29 mg/dL — ABNORMAL HIGH (ref 0.76–1.27)
Glucose: 113 mg/dL — ABNORMAL HIGH (ref 70–99)
Potassium: 4.5 mmol/L (ref 3.5–5.2)
Sodium: 142 mmol/L (ref 134–144)
eGFR: 59 mL/min/{1.73_m2} — ABNORMAL LOW (ref >59)

## 2021-07-30 LAB — CBC
Hematocrit: 45 % (ref 37.5–51.0)
Hemoglobin: 15.9 g/dL (ref 13.0–17.7)
MCH: 32.9 pg (ref 26.6–33.0)
MCHC: 35.3 g/dL (ref 31.5–35.7)
MCV: 93 fL (ref 79–97)
Platelets: 226 10*3/uL (ref 150–450)
RBC: 4.84 x10E6/uL (ref 4.14–5.80)
RDW: 13.8 % (ref 11.6–15.4)
WBC: 11.2 10*3/uL — ABNORMAL HIGH (ref 3.4–10.8)

## 2021-07-30 LAB — LIPID PANEL
Chol/HDL Ratio: 3.3 ratio (ref 0.0–5.0)
Cholesterol, Total: 139 mg/dL (ref 100–199)
HDL: 42 mg/dL (ref >39)
LDL Chol Calc (NIH): 76 mg/dL (ref 0–99)
Triglycerides: 113 mg/dL (ref 0–149)
VLDL Cholesterol Cal: 21 mg/dL (ref 5–40)

## 2021-07-30 LAB — URIC ACID: Uric Acid: 4.2 mg/dL (ref 3.8–8.4)

## 2021-08-07 ENCOUNTER — Other Ambulatory Visit: Payer: Self-pay

## 2021-08-07 ENCOUNTER — Telehealth: Payer: Self-pay | Admitting: Student in an Organized Health Care Education/Training Program

## 2021-08-07 ENCOUNTER — Ambulatory Visit (INDEPENDENT_AMBULATORY_CARE_PROVIDER_SITE_OTHER): Payer: Medicare HMO | Admitting: *Deleted

## 2021-08-07 DIAGNOSIS — I4819 Other persistent atrial fibrillation: Secondary | ICD-10-CM | POA: Diagnosis not present

## 2021-08-07 DIAGNOSIS — Z5181 Encounter for therapeutic drug level monitoring: Secondary | ICD-10-CM

## 2021-08-07 LAB — POCT INR: INR: 3 (ref 2.0–3.0)

## 2021-08-07 NOTE — Telephone Encounter (Signed)
Labs printed and placed in the mail.

## 2021-08-07 NOTE — Telephone Encounter (Signed)
Pt  calling to f/u with his test results.

## 2021-08-07 NOTE — Patient Instructions (Signed)
Description   Continue taking 1/2 tablet daily except for 1 tablet on Monday, Wednesday, and Friday. Have some leafy veggies today and remain consistent. Recheck INR in 5 weeks. Coumadin Clinic [phone].

## 2021-08-07 NOTE — Telephone Encounter (Signed)
I spoke with patient about lab results. He says he did not receive my lab letter, I confirmed his address. Can we look into if it was sent out?  Also, he has not heard anything about the echo or the PFTs that I ordered on 1/30. Can we look into it and make sure those tests get scheduled?

## 2021-08-08 NOTE — Telephone Encounter (Signed)
Mamie NT is working on PFT's.

## 2021-08-08 NOTE — Telephone Encounter (Signed)
I spoke with pt today to let him know I did mailed his lab results to his address last week. I confirmed the address on file and told the pt to be on the lookout for the mail.

## 2021-08-09 ENCOUNTER — Other Ambulatory Visit: Payer: Self-pay | Admitting: Student in an Organized Health Care Education/Training Program

## 2021-08-12 ENCOUNTER — Other Ambulatory Visit: Payer: Self-pay | Admitting: Student in an Organized Health Care Education/Training Program

## 2021-08-12 DIAGNOSIS — E7800 Pure hypercholesterolemia, unspecified: Secondary | ICD-10-CM

## 2021-08-12 LAB — SARS CORONAVIRUS 2 (TAT 6-24 HRS): SARS Coronavirus 2: NEGATIVE

## 2021-08-13 ENCOUNTER — Other Ambulatory Visit: Payer: Self-pay

## 2021-08-13 ENCOUNTER — Ambulatory Visit (HOSPITAL_COMMUNITY)
Admission: RE | Admit: 2021-08-13 | Discharge: 2021-08-13 | Disposition: A | Discharge status: Home / Self Care | Payer: Medicare HMO | Source: Ambulatory Visit | Attending: Student in an Organized Health Care Education/Training Program | Admitting: Student in an Organized Health Care Education/Training Program

## 2021-08-13 DIAGNOSIS — R0609 Other forms of dyspnea: Secondary | ICD-10-CM | POA: Diagnosis not present

## 2021-08-13 LAB — PULMONARY FUNCTION TEST
DL/VA % pred: 60 %
DL/VA: 2.46 ml/min/mmHg/L
DLCO cor % pred: 56 %
DLCO cor: 12.62 ml/min/mmHg
DLCO unc % pred: 58 %
DLCO unc: 13.06 ml/min/mmHg
FEF 25-75 Post: 1.27 L/sec
FEF 25-75 Pre: 1.61 L/sec
FEF2575-%Change-Post: -21 %
FEF2575-%Pred-Post: 64 %
FEF2575-%Pred-Pre: 82 %
FEV1-%Change-Post: -4 %
FEV1-%Pred-Post: 81 %
FEV1-%Pred-Pre: 85 %
FEV1-Post: 2.15 L
FEV1-Pre: 2.26 L
FEV1FVC-%Change-Post: 0 %
FEV1FVC-%Pred-Pre: 99 %
FEV6-%Change-Post: -5 %
FEV6-%Pred-Post: 85 %
FEV6-%Pred-Pre: 89 %
FEV6-Post: 2.91 L
FEV6-Pre: 3.08 L
FEV6FVC-%Change-Post: -1 %
FEV6FVC-%Pred-Post: 105 %
FEV6FVC-%Pred-Pre: 106 %
FVC-%Change-Post: -4 %
FVC-%Pred-Post: 81 %
FVC-%Pred-Pre: 84 %
FVC-Post: 2.97 L
FVC-Pre: 3.09 L
Post FEV1/FVC ratio: 72 %
Post FEV6/FVC ratio: 98 %
Pre FEV1/FVC ratio: 73 %
Pre FEV6/FVC Ratio: 99 %
RV % pred: 154 %
RV: 3.53 L
TLC % pred: 112 %
TLC: 6.98 L

## 2021-08-13 MED ORDER — ALBUTEROL SULFATE (2.5 MG/3ML) 0.083% IN NEBU
2.5000 mg | INHALATION_SOLUTION | Freq: Once | RESPIRATORY_TRACT | Status: AC
  Administered 2021-08-13: 2.5 mg via RESPIRATORY_TRACT

## 2021-08-14 ENCOUNTER — Telehealth: Payer: Self-pay | Admitting: Student in an Organized Health Care Education/Training Program

## 2021-08-14 NOTE — Telephone Encounter (Signed)
I spoke with [name] by phone and gave him the results of the PFTs, which showed some emphysema consistent with history of tobacco use, but no COPD or restrictive disease.   He has not yet heard from scheduler for the echo. [place], can we check to see if we got insurance authorization and if this can be scheduled soon please?

## 2021-08-29 ENCOUNTER — Ambulatory Visit (HOSPITAL_COMMUNITY)
Admission: RE | Admit: 2021-08-29 | Discharge: 2021-08-29 | Disposition: A | Discharge status: Home / Self Care | Payer: Medicare HMO | Source: Ambulatory Visit | Attending: Student in an Organized Health Care Education/Training Program | Admitting: Student in an Organized Health Care Education/Training Program

## 2021-08-29 ENCOUNTER — Other Ambulatory Visit: Payer: Self-pay

## 2021-08-29 DIAGNOSIS — I10 Essential (primary) hypertension: Secondary | ICD-10-CM | POA: Diagnosis not present

## 2021-08-29 DIAGNOSIS — I352 Nonrheumatic aortic (valve) stenosis with insufficiency: Secondary | ICD-10-CM | POA: Insufficient documentation

## 2021-08-29 DIAGNOSIS — I4891 Unspecified atrial fibrillation: Secondary | ICD-10-CM | POA: Diagnosis not present

## 2021-08-29 DIAGNOSIS — I350 Nonrheumatic aortic (valve) stenosis: Secondary | ICD-10-CM | POA: Diagnosis not present

## 2021-08-29 LAB — ECHOCARDIOGRAM COMPLETE
AR max vel: 0.8 cm2
AV Area VTI: 0.89 cm2
AV Area mean vel: 0.83 cm2
AV Mean grad: 20 mmHg
AV Peak grad: 31.4 mmHg
Ao pk vel: 2.8 m/s
Area-P 1/2: 4.17 cm2
Calc EF: 51.1 %
P 1/2 time: 792 msec
S' Lateral: 2.7 cm
Single Plane A2C EF: 48.5 %
Single Plane A4C EF: 52.3 %

## 2021-09-12 ENCOUNTER — Ambulatory Visit (INDEPENDENT_AMBULATORY_CARE_PROVIDER_SITE_OTHER): Payer: Medicare HMO | Admitting: *Deleted

## 2021-09-12 ENCOUNTER — Other Ambulatory Visit: Payer: Self-pay

## 2021-09-12 DIAGNOSIS — I4819 Other persistent atrial fibrillation: Secondary | ICD-10-CM | POA: Diagnosis not present

## 2021-09-12 DIAGNOSIS — Z5181 Encounter for therapeutic drug level monitoring: Secondary | ICD-10-CM | POA: Diagnosis not present

## 2021-09-12 LAB — POCT INR: INR: 2.6 (ref 2.0–3.0)

## 2021-09-12 NOTE — Patient Instructions (Signed)
Description   ?Continue taking 1/2 tablet daily except for 1 tablet on Monday, Wednesday, and Friday. Have some leafy veggies today and remain consistent. Recheck INR in 6 weeks. Coumadin Clinic [phone].  ?  ? ? ?

## 2021-09-30 ENCOUNTER — Encounter: Payer: Self-pay | Admitting: Nurse Practitioner

## 2021-09-30 ENCOUNTER — Ambulatory Visit: Payer: Medicare HMO | Admitting: Nurse Practitioner

## 2021-09-30 VITALS — BP 130/82 | HR 61 | Ht 66.0 in | Wt 189.2 lb

## 2021-09-30 DIAGNOSIS — I10 Essential (primary) hypertension: Secondary | ICD-10-CM | POA: Diagnosis not present

## 2021-09-30 DIAGNOSIS — Z79899 Other long term (current) drug therapy: Secondary | ICD-10-CM | POA: Diagnosis not present

## 2021-09-30 DIAGNOSIS — I4821 Permanent atrial fibrillation: Secondary | ICD-10-CM

## 2021-09-30 DIAGNOSIS — I350 Nonrheumatic aortic (valve) stenosis: Secondary | ICD-10-CM | POA: Diagnosis not present

## 2021-09-30 DIAGNOSIS — Z8679 Personal history of other diseases of the circulatory system: Secondary | ICD-10-CM

## 2021-09-30 DIAGNOSIS — E785 Hyperlipidemia, unspecified: Secondary | ICD-10-CM

## 2021-09-30 DIAGNOSIS — R0600 Dyspnea, unspecified: Secondary | ICD-10-CM | POA: Diagnosis not present

## 2021-09-30 MED ORDER — FUROSEMIDE 20 MG PO TABS: 20.0000 mg | ORAL_TABLET | Freq: Every day | ORAL | 3 refills | Status: DC

## 2021-09-30 NOTE — Progress Notes (Signed)
? ? ?Office Visit  ?  ?Patient Name: [name] ?Date of Encounter: 09/30/2021 ? ?Primary Care Provider:  Axel Filler, MD ?Primary Cardiologist:  Kirk Ruths, MD ? ?Chief Complaint  ? ?76 year old male with a history of moderate aortic valve stenosis, CAD, cardiomyopathy out of proportion to CAD, permanent atrial fibrillation, hypertension, hyperlipidemia, alcohol use and emphysema who presents for follow-up related to aortic stenosis and dyspnea. ? ?Past Medical History  ?  ?Past Medical History:  ?Diagnosis Date  ? Allergic rhinitis 08/08/2013  ? takes Loratadine daily   ? Aortic stenosis 09/08/2011  ? With mild aortic regurgitation, mean gradient 13 mmHg   ? Cataract   ? right but immature  ? Complication of anesthesia   ? stopped breathing - during shoulder surgery 2009-2010  ? Coronary artery disease 10/02/2009  ? Cardiac cath (October 2007): 20% left main, 60% mid LAD, 60-70% distal LAD, 30-40% first diagonal, 30% circumflex, 40% OM, 30-40% RCA   ? Degenerative joint disease of shoulder 08/03/2013  ? Bilateral, s/p right total shoulder arthroplasty 05/29/2011 and left shoulder arthroplasty 06/08/2014  ? Diastolic dysfunction 28/08/1515  ? Echo (04/04/2016): Grade I  ? Diverticulosis 10/07/2013  ? Seen on colonoscopy in 2007   ? Erectile dysfunction 05/07/2009  ? Essential hypertension 08/03/2013  ? had been on Lisinopril but stopped per MD  ? First degree AV block 03/14/2016  ? Gastric ulcer 10/07/2013  ? Seen on EGD 04/10/2006   ? Hemorrhoids 10/07/2013  ? Hyperlipidemia 10/02/2009  ? Paroxysmal atrial fibrillation ([place]) 07/14/2006  ? One episode, provoked by alcohol use disorder, briefly anticoagulated with warfarin, then switched to aspirin alone  ? Sciatica associated with disorder of lumbar spine 08/03/2013  ? Anterolisthesis with right L 4-5 nerve root compression.  Treated with epidural injections and Physical Therapy.   ? Seborrheic keratosis 10/07/2013  ? Tubular adenoma of colon   ? ?Past Surgical  History:  ?Procedure Laterality Date  ? CARDIAC CATHETERIZATION  2007  ? CARDIOVERSION    ? CARDIOVERSION N/A 08/04/2019  ? Procedure: CARDIOVERSION;  Surgeon: Fay Records, MD;  Location: [place];  Service: Cardiovascular;  Laterality: N/A;  ? COLONOSCOPY    ? INSERTION OF MESH N/A 03/11/2019  ? Procedure: Insertion Of Mesh;  Surgeon: Ralene Ok, MD;  Location: [place];  Service: General;  Laterality: N/A;  ? JOINT REPLACEMENT    ? KNEE ARTHROSCOPY WITH MENISCAL REPAIR Right 01/30/2011  ? right finger surgery    ? as a child  ? TONSILLECTOMY    ? TOTAL KNEE ARTHROPLASTY Left 07/28/2017  ? Procedure: LEFT TOTAL KNEE ARTHROPLASTY;  Surgeon: Renette Butters, MD;  Location: [place];  Service: Orthopedics;  Laterality: Left;  ? TOTAL SHOULDER ARTHROPLASTY  05/29/2011  ? Procedure: TOTAL SHOULDER ARTHROPLASTY;  Surgeon: Metta Clines Supple;  Location: [place];  Service: Orthopedics;  Laterality: Right;  ? TOTAL SHOULDER ARTHROPLASTY Left 06/08/2014  ? DR SUPPLE  ? TOTAL SHOULDER ARTHROPLASTY Left 06/08/2014  ? Procedure: LEFT TOTAL SHOULDER ARTHROPLASTY;  Surgeon: Marin Shutter, MD;  Location: [place];  Service: Orthopedics;  Laterality: Left;  ? UMBILICAL HERNIA REPAIR N/A 03/11/2019  ? Procedure: LAPAROSCOPIC UMBILICAL HERNIA;  Surgeon: Ralene Ok, MD;  Location: [place];  Service: General;  Laterality: N/A;  ? ? ?Allergies ? ?No Known Allergies ? ?History of Present Illness  ?  ?76 year old male with the above past medical history including moderate aortic valve stenosis, CAD, cardiomyopathy out of proportion to CAD, permanent  atrial fibrillation, hypertension, hyperlipidemia, alcohol use, and emphysema. ? ?Cardiac catheterization October 2017 showed 20% LM, 60% mLAD 60-70% dLAD, 30-40% D1, 30% LCx, 40% OM, and 30-40% RCA stenosis. EF was 25 to 30% at that time.  He underwent DCCV in February 2021 for persistent atrial fibrillation. He did have recurrence of atrial fibrillation, and was transitioned to a rate control  strategy, on Coumadin (previously declined DOAC's due to cost). Echocardiogram in September 2021 showed normal LV function, mild LVH, moderate LAE, trace aortic insufficiency, moderate aortic stenosis with mean gradient 18 mmHg.  He was last seen in the office on 05/10/2020 and was stable overall from a cardiac standpoint.  Repeat echocardiogram per PCP in 08/2021 the setting of dyspnea stable showed moderate aortic stenosis, mean gradient 20 mmHg, EF 60 to 65%, no RWMA, mild LVH. It was thought that his dyspnea was in the setting of deconditioning rather than lung disease or heart disease.  His PCP recommended gradual increase in physical activity. ? ?He presents today for follow-up.  Since his last visit been stable overall from a cardiac standpoint. He does report some ongoing dyspnea at rest and on exertion. He was told by his PCP that he may need an albuterol inhaler, however, this was never prescribed. He does have occasional wheezing.  Despite having ongoing dyspnea, he is exercising regularly, he denies additional symptoms concerning for angina. Other than his ongoing shortness of breath, he denies any additional concerns today. ? ?Home Medications  ?  ?Current Outpatient Medications  ?Medication Sig Dispense Refill  ? acetaminophen (TYLENOL) 500 MG tablet Take 500 mg by mouth every 6 (six) hours as needed for moderate pain.    ? allopurinol (ZYLOPRIM) 100 MG tablet TAKE 2 TABLETS EVERY DAY (DOSE INCREASE) 180 tablet 3  ? atorvastatin (LIPITOR) 40 MG tablet TAKE 1 TABLET EVERY DAY 90 tablet 3  ? carvedilol (COREG) 6.25 MG tablet TAKE 1 TABLET TWICE DAILY WITH MEALS 180 tablet 0  ? losartan (COZAAR) 50 MG tablet TAKE 1 TABLET EVERY DAY 90 tablet 3  ? warfarin (COUMADIN) 5 MG tablet TAKE 1/2 TO 1 TABLET BY MOUTH DAILY AS DIRECTED BY COUMADIN CLINIC 90 tablet 0  ? furosemide (LASIX) 20 MG tablet Take 1 tablet (20 mg total) by mouth daily. 30 tablet 3  ? ?No current facility-administered medications for this  visit.  ?  ? ?Review of Systems  ?  ?He denies chest pain, palpitations, pnd, orthopnea, n, v, dizziness, syncope, edema, weight gain, or early satiety. All other systems reviewed and are otherwise negative except as noted above.  ? ?Physical Exam  ?  ?VS:  BP 130/82   Pulse 61   Ht '5\' 6"'$  (1.676 m)   Wt 189 lb 3.2 oz (85.8 kg)   SpO2 97%   BMI 30.54 kg/m?   ?GEN: Well nourished, well developed, in no acute distress. ?HEENT: normal. ?Neck: Supple, no JVD, carotid bruits, or masses. ?Cardiac: IRIR, 3/6 murmur, no rubs, or gallops. No clubbing, cyanosis, edema.  Radials/DP/PT 2+ and equal bilaterally.  ?Respiratory:  Respirations regular and unlabored, clear to auscultation bilaterally. ?GI: Soft, nontender, nondistended, BS + x 4. ?MS: no deformity or atrophy. ?Skin: warm and dry, no rash. ?Neuro:  Strength and sensation are intact. ?Psych: Normal affect. ? ?Accessory Clinical Findings  ?  ?ECG personally reviewed by me today -Atrial fibrillation, 61 bpm- no acute changes. ? ?Lab Results  ?Component Value Date  ? WBC 11.2 (H) 07/29/2021  ? HGB 15.9 07/29/2021  ?  HCT 45.0 07/29/2021  ? MCV 93 07/29/2021  ? PLT 226 07/29/2021  ? ?Lab Results  ?Component Value Date  ? CREATININE 1.29 (H) 07/29/2021  ? BUN 20 07/29/2021  ? NA 142 07/29/2021  ? K 4.5 07/29/2021  ? CL 106 07/29/2021  ? CO2 23 07/29/2021  ? ?Lab Results  ?Component Value Date  ? ALT 11 09/08/2019  ? AST 24 09/08/2019  ? ALKPHOS 91 09/08/2019  ? BILITOT 0.4 09/08/2019  ? ?Lab Results  ?Component Value Date  ? CHOL 139 07/29/2021  ? HDL 42 07/29/2021  ? [place] 76 07/29/2021  ? LDLDIRECT 89.6 03/03/2007  ? TRIG 113 07/29/2021  ? CHOLHDL 3.3 07/29/2021  ?  ?Lab Results  ?Component Value Date  ? HGBA1C 5.7 (A) 07/29/2021  ? ? ?Assessment & Plan  ?  ?1. Moderate aortic stenosis/dyspnea/h/o cardiomyopathy: Most recent echo per PCP in 08/2021 the setting of dyspnea showed stable moderate aortic stenosis, EF 60 to 65%, no RWMA, mild LVH.  He does have 3 out of 6  murmur on exam.  He reports ongoing dyspnea at rest and with exertion.  He denies weight gain, edema. However, given history of aortic stenosis and ongoing dyspnea will trial Lasix 20 mg daily with close follow

## 2021-09-30 NOTE — Patient Instructions (Addendum)
Medication Instructions:  ?Start Lasix 20 mg daily ? ?*If you need a refill on your cardiac medications before your next appointment, please call your pharmacy* ? ? ?Lab Work: ?Your physician recommends that you complete labs today. ?BMET ? ?If you have labs (blood work) drawn today and your tests are completely normal, you will receive your results only by: ?MyChart Message (if you have MyChart) OR ?A paper copy in the mail ?If you have any lab test that is abnormal or we need to change your treatment, we will call you to review the results. ? ? ?Testing/Procedures: ?NONE ordered at this time of appointment  ? ? ? ?Follow-Up: ?At [facility], you and your health needs are our priority.  As part of our continuing mission to provide you with exceptional heart care, we have created designated Provider Care Teams.  These Care Teams include your primary Cardiologist (physician) and Advanced Practice Providers (APPs -  Physician Assistants and Nurse Practitioners) who all work together to provide you with the care you need, when you need it. ? ?We recommend signing up for the patient portal called "MyChart".  Sign up information is provided on this After Visit Summary.  MyChart is used to connect with patients for Virtual Visits (Telemedicine).  Patients are able to view lab/test results, encounter notes, upcoming appointments, etc.  Non-urgent messages can be sent to your provider as well.   ?To learn more about what you can do with MyChart, go to [email or web address].   ? ?Your next appointment:   ?1 week(s) ? ?The format for your next appointment:   ?In Person ? ?Provider:   ?Diona Browner, NP      ? ? ?Other Instructions ? ? ?

## 2021-10-01 LAB — BASIC METABOLIC PANEL
BUN/Creatinine Ratio: 16 (ref 10–24)
BUN: 19 mg/dL (ref 8–27)
CO2: 21 mmol/L (ref 20–29)
Calcium: 9.4 mg/dL (ref 8.6–10.2)
Chloride: 103 mmol/L (ref 96–106)
Creatinine, Ser: 1.19 mg/dL (ref 0.76–1.27)
Glucose: 80 mg/dL (ref 70–99)
Potassium: 4.2 mmol/L (ref 3.5–5.2)
Sodium: 139 mmol/L (ref 134–144)
eGFR: 64 mL/min/{1.73_m2} (ref >59)

## 2021-10-02 ENCOUNTER — Telehealth: Payer: Self-pay

## 2021-10-02 NOTE — Telephone Encounter (Signed)
Spoke with pt. Pt was notified of results and will f/u as planned.  ?

## 2021-10-07 NOTE — Progress Notes (Signed)
? ? ?Office Visit  ?  ?Patient Name: [name] ?Date of Encounter: 10/10/2021 ? ?Primary Care Provider:  Axel Filler, MD ?Primary Cardiologist:  Kirk Ruths, MD ? ?Chief Complaint  ?  ?76 year old male with a history of moderate aortic valve stenosis, CAD, cardiomyopathy out of proportion to CAD, permanent atrial fibrillation, hypertension, hyperlipidemia, alcohol use and emphysema who presents for follow-up related to aortic stenosis and dyspnea. ? ?Past Medical History  ?  ?Past Medical History:  ?Diagnosis Date  ? Allergic rhinitis 08/08/2013  ? takes Loratadine daily   ? Aortic stenosis 09/08/2011  ? With mild aortic regurgitation, mean gradient 13 mmHg   ? Cataract   ? right but immature  ? Complication of anesthesia   ? stopped breathing - during shoulder surgery 2009-2010  ? Coronary artery disease 10/02/2009  ? Cardiac cath (October 2007): 20% left main, 60% mid LAD, 60-70% distal LAD, 30-40% first diagonal, 30% circumflex, 40% OM, 30-40% RCA   ? Degenerative joint disease of shoulder 08/03/2013  ? Bilateral, s/p right total shoulder arthroplasty 05/29/2011 and left shoulder arthroplasty 06/08/2014  ? Diastolic dysfunction 82/10/537  ? Echo (04/04/2016): Grade I  ? Diverticulosis 10/07/2013  ? Seen on colonoscopy in 2007   ? Erectile dysfunction 05/07/2009  ? Essential hypertension 08/03/2013  ? had been on Lisinopril but stopped per MD  ? First degree AV block 03/14/2016  ? Gastric ulcer 10/07/2013  ? Seen on EGD 04/10/2006   ? Hemorrhoids 10/07/2013  ? Hyperlipidemia 10/02/2009  ? Paroxysmal atrial fibrillation ([place]) 07/14/2006  ? One episode, provoked by alcohol use disorder, briefly anticoagulated with warfarin, then switched to aspirin alone  ? Sciatica associated with disorder of lumbar spine 08/03/2013  ? Anterolisthesis with right L 4-5 nerve root compression.  Treated with epidural injections and Physical Therapy.   ? Seborrheic keratosis 10/07/2013  ? Tubular adenoma of colon   ? ?Past Surgical  History:  ?Procedure Laterality Date  ? CARDIAC CATHETERIZATION  2007  ? CARDIOVERSION    ? CARDIOVERSION N/A 08/04/2019  ? Procedure: CARDIOVERSION;  Surgeon: Fay Records, MD;  Location: [place];  Service: Cardiovascular;  Laterality: N/A;  ? COLONOSCOPY    ? INSERTION OF MESH N/A 03/11/2019  ? Procedure: Insertion Of Mesh;  Surgeon: Ralene Ok, MD;  Location: [place];  Service: General;  Laterality: N/A;  ? JOINT REPLACEMENT    ? KNEE ARTHROSCOPY WITH MENISCAL REPAIR Right 01/30/2011  ? right finger surgery    ? as a child  ? TONSILLECTOMY    ? TOTAL KNEE ARTHROPLASTY Left 07/28/2017  ? Procedure: LEFT TOTAL KNEE ARTHROPLASTY;  Surgeon: Renette Butters, MD;  Location: [place];  Service: Orthopedics;  Laterality: Left;  ? TOTAL SHOULDER ARTHROPLASTY  05/29/2011  ? Procedure: TOTAL SHOULDER ARTHROPLASTY;  Surgeon: Metta Clines Supple;  Location: [place];  Service: Orthopedics;  Laterality: Right;  ? TOTAL SHOULDER ARTHROPLASTY Left 06/08/2014  ? DR SUPPLE  ? TOTAL SHOULDER ARTHROPLASTY Left 06/08/2014  ? Procedure: LEFT TOTAL SHOULDER ARTHROPLASTY;  Surgeon: Marin Shutter, MD;  Location: [place];  Service: Orthopedics;  Laterality: Left;  ? UMBILICAL HERNIA REPAIR N/A 03/11/2019  ? Procedure: LAPAROSCOPIC UMBILICAL HERNIA;  Surgeon: Ralene Ok, MD;  Location: [place];  Service: General;  Laterality: N/A;  ? ? ?Allergies ? ?No Known Allergies ? ?History of Present Illness  ?  ?76 year old male with the above past medical history including moderate aortic valve stenosis, CAD, cardiomyopathy out of proportion to CAD,  permanent atrial fibrillation, hypertension, hyperlipidemia, alcohol use, and emphysema. ?  ?Cardiac catheterization October 2017 showed 20% LM, 60% mLAD 60-70% dLAD, 30-40% D1, 30% LCx, 40% OM, and 30-40% RCA stenosis. EF was 25 to 30% at that time.  He underwent DCCV in February 2021 for persistent atrial fibrillation. He did have recurrence of atrial fibrillation, and was transitioned to a rate  control strategy, on Coumadin (previously declined DOAC's due to cost). Echocardiogram in September 2021 showed normal LV function, mild LVH, moderate LAE, trace aortic insufficiency, moderate aortic stenosis with mean gradient 18 mmHg.  Repeat echocardiogram per PCP in 08/2021 the setting of dyspnea stable showed moderate aortic stenosis, mean gradient 20 mmHg, EF 60 to 65%, no RWMA, mild LVH. It was thought that his dyspnea was in the setting of deconditioning rather than lung disease or heart disease.  His PCP recommended gradual increase in physical activity. ?  ?He was last seen in the office on 09/30/2021 and was stable overall from a cardiac standpoint. He did report some ongoing dyspnea at rest and on exertion. He denied symptoms concerning for angina.  He was started on 20 mg Lasix daily. He presents today for follow-up. Since his last visit he has been stable from a cardiac standpoint. His shortness of breath has improved with Lasix. He is tolerating regular exercise (he uses a rowing machine at they gym).  He denies symptoms concerning for angina.  He did have some mild cramping in his feet at night, repeat BMET pending. Overall, he reports feeling well denies any new concerns today. ? ?Home Medications  ?  ?Current Outpatient Medications  ?Medication Sig Dispense Refill  ? acetaminophen (TYLENOL) 500 MG tablet Take 500 mg by mouth every 6 (six) hours as needed for moderate pain.    ? allopurinol (ZYLOPRIM) 100 MG tablet TAKE 2 TABLETS EVERY DAY (DOSE INCREASE) 180 tablet 3  ? atorvastatin (LIPITOR) 40 MG tablet TAKE 1 TABLET EVERY DAY 90 tablet 3  ? carvedilol (COREG) 6.25 MG tablet TAKE 1 TABLET TWICE DAILY WITH MEALS 180 tablet 0  ? furosemide (LASIX) 20 MG tablet Take 1 tablet (20 mg total) by mouth daily. 30 tablet 3  ? losartan (COZAAR) 50 MG tablet TAKE 1 TABLET EVERY DAY 90 tablet 3  ? warfarin (COUMADIN) 5 MG tablet TAKE 1/2 TO 1 TABLET BY MOUTH DAILY AS DIRECTED BY COUMADIN CLINIC 90 tablet 0   ? ?No current facility-administered medications for this visit.  ?  ? ?Review of Systems  ?  ?He denies chest pain, palpitations, dyspnea, pnd, orthopnea, n, v, dizziness, syncope, edema, weight gain, or early satiety. All other systems reviewed and are otherwise negative except as noted above.  ? ?Physical Exam  ?  ?VS:  BP 140/72   Pulse 74   Ht '5\' 6"'$  (1.676 m)   Wt 186 lb 6.4 oz (84.6 kg)   SpO2 96%   BMI 30.09 kg/m?   ?GEN: Well nourished, well developed, in no acute distress. ?HEENT: normal. ?Neck: Supple, no JVD, carotid bruits, or masses. ?Cardiac: IRIR, 2/6 murmur, no rubs, or gallops. No clubbing, cyanosis, edema.  Radials/DP/PT 2+ and equal bilaterally.  ?Respiratory:  Respirations regular and unlabored, clear to auscultation bilaterally. ?GI: Soft, nontender, nondistended, BS + x 4. ?MS: no deformity or atrophy. ?Skin: warm and dry, no rash. ?Neuro:  Strength and sensation are intact. ?Psych: Normal affect. ? ?Accessory Clinical Findings  ?  ?ECG personally reviewed by me today - No EKG in office today. ? ?  Lab Results  ?Component Value Date  ? WBC 11.2 (H) 07/29/2021  ? HGB 15.9 07/29/2021  ? HCT 45.0 07/29/2021  ? MCV 93 07/29/2021  ? PLT 226 07/29/2021  ? ?Lab Results  ?Component Value Date  ? CREATININE 1.19 09/30/2021  ? BUN 19 09/30/2021  ? NA 139 09/30/2021  ? K 4.2 09/30/2021  ? CL 103 09/30/2021  ? CO2 21 09/30/2021  ? ?Lab Results  ?Component Value Date  ? ALT 11 09/08/2019  ? AST 24 09/08/2019  ? ALKPHOS 91 09/08/2019  ? BILITOT 0.4 09/08/2019  ? ?Lab Results  ?Component Value Date  ? CHOL 139 07/29/2021  ? HDL 42 07/29/2021  ? [place] 76 07/29/2021  ? LDLDIRECT 89.6 03/03/2007  ? TRIG 113 07/29/2021  ? CHOLHDL 3.3 07/29/2021  ?  ?Lab Results  ?Component Value Date  ? HGBA1C 5.7 (A) 07/29/2021  ? ? ?Assessment & Plan  ?  ?1. Moderate aortic stenosis/dyspnea/h/o cardiomyopathy: Most recent echo in 08/2021 showed stable moderate aortic stenosis, EF 60 to 65%, no RWMA, mild LVH. He does have a  murmur. He reported dyspnea at rest and on exertion at his last visit and was started on Lasix. Dyspnea greatly improved with Lasix. Weight is down 3 pounds. Euvolemic and well compensated on exam.  Will check BMET t

## 2021-10-10 ENCOUNTER — Ambulatory Visit: Payer: Medicare HMO | Admitting: Nurse Practitioner

## 2021-10-10 ENCOUNTER — Encounter: Payer: Self-pay | Admitting: Nurse Practitioner

## 2021-10-10 VITALS — BP 140/72 | HR 74 | Ht 66.0 in | Wt 186.4 lb

## 2021-10-10 DIAGNOSIS — I10 Essential (primary) hypertension: Secondary | ICD-10-CM

## 2021-10-10 DIAGNOSIS — I350 Nonrheumatic aortic (valve) stenosis: Secondary | ICD-10-CM

## 2021-10-10 DIAGNOSIS — I4821 Permanent atrial fibrillation: Secondary | ICD-10-CM | POA: Diagnosis not present

## 2021-10-10 DIAGNOSIS — R0600 Dyspnea, unspecified: Secondary | ICD-10-CM

## 2021-10-10 DIAGNOSIS — I2510 Atherosclerotic heart disease of native coronary artery without angina pectoris: Secondary | ICD-10-CM | POA: Diagnosis not present

## 2021-10-10 DIAGNOSIS — E785 Hyperlipidemia, unspecified: Secondary | ICD-10-CM

## 2021-10-10 LAB — BASIC METABOLIC PANEL
BUN/Creatinine Ratio: 24 (ref 10–24)
BUN: 34 mg/dL — ABNORMAL HIGH (ref 8–27)
CO2: 23 mmol/L (ref 20–29)
Calcium: 9.4 mg/dL (ref 8.6–10.2)
Chloride: 102 mmol/L (ref 96–106)
Creatinine, Ser: 1.41 mg/dL — ABNORMAL HIGH (ref 0.76–1.27)
Glucose: 123 mg/dL — ABNORMAL HIGH (ref 70–99)
Potassium: 4.4 mmol/L (ref 3.5–5.2)
Sodium: 142 mmol/L (ref 134–144)
eGFR: 52 mL/min/{1.73_m2} — ABNORMAL LOW (ref >59)

## 2021-10-10 NOTE — Patient Instructions (Signed)
Medication Instructions:  ?You can take an extra 20 mg Lasix as needed for increased swelling/weight gain 3lbs in a day or 5lbs in a week. ? ?*If you need a refill on your cardiac medications before your next appointment, please call your pharmacy* ? ? ?Lab Work: ?BMET today  ? ?If you have labs (blood work) drawn today and your tests are completely normal, you will receive your results only by: ?MyChart Message (if you have MyChart) OR ?A paper copy in the mail ?If you have any lab test that is abnormal or we need to change your treatment, we will call you to review the results. ? ? ?Follow-Up: ?At [facility], you and your health needs are our priority.  As part of our continuing mission to provide you with exceptional heart care, we have created designated Provider Care Teams.  These Care Teams include your primary Cardiologist (physician) and Advanced Practice Providers (APPs -  Physician Assistants and Nurse Practitioners) who all work together to provide you with the care you need, when you need it. ? ?We recommend signing up for the patient portal called "MyChart".  Sign up information is provided on this After Visit Summary.  MyChart is used to connect with patients for Virtual Visits (Telemedicine).  Patients are able to view lab/test results, encounter notes, upcoming appointments, etc.  Non-urgent messages can be sent to your provider as well.   ?To learn more about what you can do with MyChart, go to [email or web address].   ? ?Your next appointment:   ?6 month(s) ? ?The format for your next appointment:   ?In Person ? ?Provider:   ?Kirk Ruths, MD   ? ? ? ? ? ? ? ?

## 2021-10-16 ENCOUNTER — Telehealth: Payer: Self-pay

## 2021-10-16 NOTE — Telephone Encounter (Signed)
Spoke with pt. Pt was notified of lab results. Pt will continue  current medications and follow up as planned.  ?

## 2021-10-30 ENCOUNTER — Ambulatory Visit (INDEPENDENT_AMBULATORY_CARE_PROVIDER_SITE_OTHER): Payer: Medicare HMO | Admitting: *Deleted

## 2021-10-30 DIAGNOSIS — I4819 Other persistent atrial fibrillation: Secondary | ICD-10-CM | POA: Diagnosis not present

## 2021-10-30 DIAGNOSIS — Z5181 Encounter for therapeutic drug level monitoring: Secondary | ICD-10-CM

## 2021-10-30 LAB — POCT INR: INR: 2.8 (ref 2.0–3.0)

## 2021-10-30 NOTE — Patient Instructions (Signed)
Description   ?Continue taking 1/2 tablet daily except for 1 tablet on Monday, Wednesday, and Friday. Remain consistent with leafy veggies. Recheck INR in 6 weeks. [facility] [phone].  ?  ?  ?

## 2021-11-18 ENCOUNTER — Other Ambulatory Visit: Payer: Self-pay | Admitting: Cardiology

## 2021-11-18 ENCOUNTER — Other Ambulatory Visit: Payer: Self-pay | Admitting: Nurse Practitioner

## 2021-11-18 DIAGNOSIS — I25119 Atherosclerotic heart disease of native coronary artery with unspecified angina pectoris: Secondary | ICD-10-CM

## 2021-12-10 ENCOUNTER — Ambulatory Visit (INDEPENDENT_AMBULATORY_CARE_PROVIDER_SITE_OTHER): Payer: Medicare HMO | Admitting: *Deleted

## 2021-12-10 DIAGNOSIS — Z5181 Encounter for therapeutic drug level monitoring: Secondary | ICD-10-CM

## 2021-12-10 DIAGNOSIS — I4819 Other persistent atrial fibrillation: Secondary | ICD-10-CM

## 2021-12-10 LAB — POCT INR: INR: 3.3 — AB (ref 2.0–3.0)

## 2021-12-10 NOTE — Patient Instructions (Signed)
Description   Hold warfarin today and then continue taking warfarin 1/2 tablet daily except for 1 tablet on Monday, Wednesday, and Friday. Remain consistent with leafy veggies. Recheck INR in 4 weeks. [facility]in Clinic [phone].

## 2021-12-28 ENCOUNTER — Emergency Department (HOSPITAL_COMMUNITY): Payer: Medicare HMO

## 2021-12-28 ENCOUNTER — Inpatient Hospital Stay (HOSPITAL_COMMUNITY)
Admission: EM | Admit: 2021-12-28 | Discharge: 2022-01-20 | DRG: 853 | Disposition: A | Discharge status: Home Health | Payer: Medicare HMO | Attending: Student in an Organized Health Care Education/Training Program | Admitting: Student in an Organized Health Care Education/Training Program

## 2021-12-28 ENCOUNTER — Encounter (HOSPITAL_COMMUNITY): Payer: Self-pay

## 2021-12-28 ENCOUNTER — Other Ambulatory Visit: Payer: Self-pay

## 2021-12-28 ENCOUNTER — Ambulatory Visit
Admission: EM | Admit: 2021-12-28 | Discharge: 2021-12-28 | Disposition: A | Discharge status: Other Acute Inpatient Hospital | Payer: Medicare HMO | Attending: Internal Medicine | Admitting: Internal Medicine

## 2021-12-28 DIAGNOSIS — Z79899 Other long term (current) drug therapy: Secondary | ICD-10-CM

## 2021-12-28 DIAGNOSIS — I25119 Atherosclerotic heart disease of native coronary artery with unspecified angina pectoris: Secondary | ICD-10-CM

## 2021-12-28 DIAGNOSIS — T80818A Extravasation of other vesicant agent, initial encounter: Secondary | ICD-10-CM | POA: Diagnosis not present

## 2021-12-28 DIAGNOSIS — Z66 Do not resuscitate: Secondary | ICD-10-CM | POA: Diagnosis present

## 2021-12-28 DIAGNOSIS — E8770 Fluid overload, unspecified: Secondary | ICD-10-CM | POA: Diagnosis present

## 2021-12-28 DIAGNOSIS — Y733 Surgical instruments, materials and gastroenterology and urology devices (including sutures) associated with adverse incidents: Secondary | ICD-10-CM | POA: Diagnosis not present

## 2021-12-28 DIAGNOSIS — I2510 Atherosclerotic heart disease of native coronary artery without angina pectoris: Secondary | ICD-10-CM | POA: Diagnosis present

## 2021-12-28 DIAGNOSIS — Z0389 Encounter for observation for other suspected diseases and conditions ruled out: Secondary | ICD-10-CM | POA: Diagnosis not present

## 2021-12-28 DIAGNOSIS — Z4682 Encounter for fitting and adjustment of non-vascular catheter: Secondary | ICD-10-CM | POA: Diagnosis not present

## 2021-12-28 DIAGNOSIS — Z96612 Presence of left artificial shoulder joint: Secondary | ICD-10-CM | POA: Diagnosis present

## 2021-12-28 DIAGNOSIS — Z9911 Dependence on respirator [ventilator] status: Secondary | ICD-10-CM | POA: Diagnosis not present

## 2021-12-28 DIAGNOSIS — B9620 Unspecified Escherichia coli [E. coli] as the cause of diseases classified elsewhere: Secondary | ICD-10-CM | POA: Diagnosis present

## 2021-12-28 DIAGNOSIS — K828 Other specified diseases of gallbladder: Secondary | ICD-10-CM | POA: Diagnosis not present

## 2021-12-28 DIAGNOSIS — N3289 Other specified disorders of bladder: Secondary | ICD-10-CM

## 2021-12-28 DIAGNOSIS — N200 Calculus of kidney: Secondary | ICD-10-CM | POA: Diagnosis not present

## 2021-12-28 DIAGNOSIS — N99840 Postprocedural hematoma of a genitourinary system organ or structure following a genitourinary system procedure: Secondary | ICD-10-CM | POA: Diagnosis not present

## 2021-12-28 DIAGNOSIS — K810 Acute cholecystitis: Secondary | ICD-10-CM | POA: Diagnosis not present

## 2021-12-28 DIAGNOSIS — Z8249 Family history of ischemic heart disease and other diseases of the circulatory system: Secondary | ICD-10-CM

## 2021-12-28 DIAGNOSIS — Y838 Other surgical procedures as the cause of abnormal reaction of the patient, or of later complication, without mention of misadventure at the time of the procedure: Secondary | ICD-10-CM | POA: Diagnosis not present

## 2021-12-28 DIAGNOSIS — E7800 Pure hypercholesterolemia, unspecified: Secondary | ICD-10-CM

## 2021-12-28 DIAGNOSIS — R319 Hematuria, unspecified: Secondary | ICD-10-CM

## 2021-12-28 DIAGNOSIS — Z6832 Body mass index (BMI) 32.0-32.9, adult: Secondary | ICD-10-CM

## 2021-12-28 DIAGNOSIS — R569 Unspecified convulsions: Secondary | ICD-10-CM | POA: Diagnosis not present

## 2021-12-28 DIAGNOSIS — K5730 Diverticulosis of large intestine without perforation or abscess without bleeding: Secondary | ICD-10-CM | POA: Diagnosis not present

## 2021-12-28 DIAGNOSIS — Z1630 Resistance to unspecified antimicrobial drugs: Secondary | ICD-10-CM | POA: Diagnosis present

## 2021-12-28 DIAGNOSIS — I700 Atherosclerosis of aorta: Secondary | ICD-10-CM | POA: Diagnosis not present

## 2021-12-28 DIAGNOSIS — J9811 Atelectasis: Secondary | ICD-10-CM | POA: Diagnosis not present

## 2021-12-28 DIAGNOSIS — J9601 Acute respiratory failure with hypoxia: Secondary | ICD-10-CM | POA: Diagnosis not present

## 2021-12-28 DIAGNOSIS — Z87891 Personal history of nicotine dependence: Secondary | ICD-10-CM | POA: Diagnosis not present

## 2021-12-28 DIAGNOSIS — D649 Anemia, unspecified: Secondary | ICD-10-CM | POA: Diagnosis not present

## 2021-12-28 DIAGNOSIS — K4090 Unilateral inguinal hernia, without obstruction or gangrene, not specified as recurrent: Secondary | ICD-10-CM | POA: Diagnosis not present

## 2021-12-28 DIAGNOSIS — E785 Hyperlipidemia, unspecified: Secondary | ICD-10-CM | POA: Diagnosis present

## 2021-12-28 DIAGNOSIS — E872 Acidosis, unspecified: Secondary | ICD-10-CM | POA: Diagnosis present

## 2021-12-28 DIAGNOSIS — G9341 Metabolic encephalopathy: Secondary | ICD-10-CM | POA: Diagnosis not present

## 2021-12-28 DIAGNOSIS — R531 Weakness: Secondary | ICD-10-CM | POA: Diagnosis not present

## 2021-12-28 DIAGNOSIS — R109 Unspecified abdominal pain: Secondary | ICD-10-CM | POA: Diagnosis not present

## 2021-12-28 DIAGNOSIS — N138 Other obstructive and reflux uropathy: Secondary | ICD-10-CM | POA: Diagnosis not present

## 2021-12-28 DIAGNOSIS — R6521 Severe sepsis with septic shock: Secondary | ICD-10-CM | POA: Diagnosis present

## 2021-12-28 DIAGNOSIS — K219 Gastro-esophageal reflux disease without esophagitis: Secondary | ICD-10-CM | POA: Diagnosis present

## 2021-12-28 DIAGNOSIS — Z7901 Long term (current) use of anticoagulants: Secondary | ICD-10-CM

## 2021-12-28 DIAGNOSIS — D72829 Elevated white blood cell count, unspecified: Secondary | ICD-10-CM

## 2021-12-28 DIAGNOSIS — Z9359 Other cystostomy status: Secondary | ICD-10-CM | POA: Diagnosis not present

## 2021-12-28 DIAGNOSIS — I4821 Permanent atrial fibrillation: Secondary | ICD-10-CM | POA: Diagnosis present

## 2021-12-28 DIAGNOSIS — R791 Abnormal coagulation profile: Secondary | ICD-10-CM | POA: Diagnosis present

## 2021-12-28 DIAGNOSIS — Z96611 Presence of right artificial shoulder joint: Secondary | ICD-10-CM | POA: Diagnosis present

## 2021-12-28 DIAGNOSIS — I959 Hypotension, unspecified: Secondary | ICD-10-CM | POA: Diagnosis not present

## 2021-12-28 DIAGNOSIS — K921 Melena: Secondary | ICD-10-CM

## 2021-12-28 DIAGNOSIS — N401 Enlarged prostate with lower urinary tract symptoms: Secondary | ICD-10-CM | POA: Diagnosis not present

## 2021-12-28 DIAGNOSIS — M5136 Other intervertebral disc degeneration, lumbar region: Secondary | ICD-10-CM | POA: Diagnosis not present

## 2021-12-28 DIAGNOSIS — K639 Disease of intestine, unspecified: Secondary | ICD-10-CM | POA: Diagnosis not present

## 2021-12-28 DIAGNOSIS — E669 Obesity, unspecified: Secondary | ICD-10-CM | POA: Diagnosis present

## 2021-12-28 DIAGNOSIS — R4182 Altered mental status, unspecified: Secondary | ICD-10-CM | POA: Diagnosis not present

## 2021-12-28 DIAGNOSIS — R188 Other ascites: Secondary | ICD-10-CM | POA: Diagnosis present

## 2021-12-28 DIAGNOSIS — R1084 Generalized abdominal pain: Secondary | ICD-10-CM | POA: Diagnosis not present

## 2021-12-28 DIAGNOSIS — J811 Chronic pulmonary edema: Secondary | ICD-10-CM | POA: Diagnosis not present

## 2021-12-28 DIAGNOSIS — J9690 Respiratory failure, unspecified, unspecified whether with hypoxia or hypercapnia: Secondary | ICD-10-CM | POA: Diagnosis not present

## 2021-12-28 DIAGNOSIS — I701 Atherosclerosis of renal artery: Secondary | ICD-10-CM | POA: Diagnosis not present

## 2021-12-28 DIAGNOSIS — J302 Other seasonal allergic rhinitis: Secondary | ICD-10-CM | POA: Diagnosis present

## 2021-12-28 DIAGNOSIS — S3729XA Other injury of bladder, initial encounter: Secondary | ICD-10-CM | POA: Diagnosis not present

## 2021-12-28 DIAGNOSIS — L89312 Pressure ulcer of right buttock, stage 2: Secondary | ICD-10-CM | POA: Diagnosis not present

## 2021-12-28 DIAGNOSIS — I350 Nonrheumatic aortic (valve) stenosis: Secondary | ICD-10-CM | POA: Diagnosis present

## 2021-12-28 DIAGNOSIS — I4891 Unspecified atrial fibrillation: Secondary | ICD-10-CM | POA: Diagnosis not present

## 2021-12-28 DIAGNOSIS — I10 Essential (primary) hypertension: Secondary | ICD-10-CM | POA: Diagnosis present

## 2021-12-28 DIAGNOSIS — Z96652 Presence of left artificial knee joint: Secondary | ICD-10-CM | POA: Diagnosis present

## 2021-12-28 DIAGNOSIS — R0902 Hypoxemia: Secondary | ICD-10-CM | POA: Diagnosis not present

## 2021-12-28 DIAGNOSIS — E870 Hyperosmolality and hypernatremia: Secondary | ICD-10-CM | POA: Diagnosis not present

## 2021-12-28 DIAGNOSIS — J90 Pleural effusion, not elsewhere classified: Secondary | ICD-10-CM | POA: Diagnosis not present

## 2021-12-28 DIAGNOSIS — A419 Sepsis, unspecified organism: Secondary | ICD-10-CM | POA: Diagnosis not present

## 2021-12-28 DIAGNOSIS — N400 Enlarged prostate without lower urinary tract symptoms: Secondary | ICD-10-CM | POA: Diagnosis not present

## 2021-12-28 DIAGNOSIS — E876 Hypokalemia: Secondary | ICD-10-CM | POA: Diagnosis present

## 2021-12-28 DIAGNOSIS — M109 Gout, unspecified: Secondary | ICD-10-CM | POA: Diagnosis present

## 2021-12-28 DIAGNOSIS — R338 Other retention of urine: Secondary | ICD-10-CM | POA: Diagnosis not present

## 2021-12-28 DIAGNOSIS — N281 Cyst of kidney, acquired: Secondary | ICD-10-CM | POA: Diagnosis not present

## 2021-12-28 DIAGNOSIS — N170 Acute kidney failure with tubular necrosis: Secondary | ICD-10-CM | POA: Diagnosis not present

## 2021-12-28 DIAGNOSIS — R0789 Other chest pain: Secondary | ICD-10-CM | POA: Diagnosis not present

## 2021-12-28 DIAGNOSIS — R310 Gross hematuria: Secondary | ICD-10-CM | POA: Diagnosis not present

## 2021-12-28 DIAGNOSIS — K8020 Calculus of gallbladder without cholecystitis without obstruction: Secondary | ICD-10-CM | POA: Diagnosis not present

## 2021-12-28 DIAGNOSIS — L8990 Pressure ulcer of unspecified site, unspecified stage: Secondary | ICD-10-CM | POA: Insufficient documentation

## 2021-12-28 DIAGNOSIS — R0602 Shortness of breath: Secondary | ICD-10-CM | POA: Diagnosis not present

## 2021-12-28 DIAGNOSIS — R54 Age-related physical debility: Secondary | ICD-10-CM | POA: Diagnosis present

## 2021-12-28 DIAGNOSIS — S3720XA Unspecified injury of bladder, initial encounter: Secondary | ICD-10-CM | POA: Diagnosis not present

## 2021-12-28 DIAGNOSIS — I440 Atrioventricular block, first degree: Secondary | ICD-10-CM | POA: Diagnosis present

## 2021-12-28 LAB — TYPE AND SCREEN
ABO/RH(D): B NEG
Antibody Screen: NEGATIVE

## 2021-12-28 LAB — CBC WITH DIFFERENTIAL/PLATELET
Abs Immature Granulocytes: 0.19 10*3/uL — ABNORMAL HIGH (ref 0.00–0.07)
Basophils Absolute: 0.1 10*3/uL (ref 0.0–0.1)
Basophils Relative: 0 %
Eosinophils Absolute: 0 10*3/uL (ref 0.0–0.5)
Eosinophils Relative: 0 %
HCT: 40.9 % (ref 39.0–52.0)
Hemoglobin: 14.2 g/dL (ref 13.0–17.0)
Immature Granulocytes: 1 %
Lymphocytes Relative: 5 %
Lymphs Abs: 1.5 10*3/uL (ref 0.7–4.0)
MCH: 32.1 pg (ref 26.0–34.0)
MCHC: 34.7 g/dL (ref 30.0–36.0)
MCV: 92.5 fL (ref 80.0–100.0)
Monocytes Absolute: 1.3 10*3/uL — ABNORMAL HIGH (ref 0.1–1.0)
Monocytes Relative: 4 %
Neutro Abs: 27.1 10*3/uL — ABNORMAL HIGH (ref 1.7–7.7)
Neutrophils Relative %: 90 %
Platelets: 198 10*3/uL (ref 150–400)
RBC: 4.42 MIL/uL (ref 4.22–5.81)
RDW: 13.6 % (ref 11.5–15.5)
WBC: 30.2 10*3/uL — ABNORMAL HIGH (ref 4.0–10.5)
nRBC: 0 % (ref 0.0–0.2)

## 2021-12-28 LAB — COMPREHENSIVE METABOLIC PANEL
ALT: 13 U/L (ref 0–44)
AST: 28 U/L (ref 15–41)
Albumin: 3 g/dL — ABNORMAL LOW (ref 3.5–5.0)
Alkaline Phosphatase: 67 U/L (ref 38–126)
Anion gap: 16 — ABNORMAL HIGH (ref 5–15)
BUN: 33 mg/dL — ABNORMAL HIGH (ref 8–23)
CO2: 18 mmol/L — ABNORMAL LOW (ref 22–32)
Calcium: 9.1 mg/dL (ref 8.9–10.3)
Chloride: 102 mmol/L (ref 98–111)
Creatinine, Ser: 3.08 mg/dL — ABNORMAL HIGH (ref 0.61–1.24)
GFR, Estimated: 20 mL/min — ABNORMAL LOW (ref >60)
Glucose, Bld: 138 mg/dL — ABNORMAL HIGH (ref 70–99)
Potassium: 3.7 mmol/L (ref 3.5–5.1)
Sodium: 136 mmol/L (ref 135–145)
Total Bilirubin: 2.3 mg/dL — ABNORMAL HIGH (ref 0.3–1.2)
Total Protein: 7 g/dL (ref 6.5–8.1)

## 2021-12-28 LAB — GLUCOSE, CAPILLARY
Glucose-Capillary: 123 mg/dL — ABNORMAL HIGH (ref 70–99)
Glucose-Capillary: 96 mg/dL (ref 70–99)

## 2021-12-28 LAB — POC OCCULT BLOOD, ED: Fecal Occult Bld: POSITIVE — AB

## 2021-12-28 LAB — PROTIME-INR
INR: 7.3 (ref 0.8–1.2)
INR: 2.8 — ABNORMAL HIGH (ref 0.8–1.2)
Prothrombin Time: 62.1 seconds — ABNORMAL HIGH (ref 11.4–15.2)
Prothrombin Time: 29.3 seconds — ABNORMAL HIGH (ref 11.4–15.2)

## 2021-12-28 LAB — LACTIC ACID, PLASMA
Lactic Acid, Venous: 5 mmol/L (ref 0.5–1.9)
Lactic Acid, Venous: 2.9 mmol/L (ref 0.5–1.9)
Lactic Acid, Venous: 2.7 mmol/L (ref 0.5–1.9)

## 2021-12-28 LAB — I-STAT CHEM 8, ED
BUN: 33 mg/dL — ABNORMAL HIGH (ref 8–23)
Calcium, Ion: 1.04 mmol/L — ABNORMAL LOW (ref 1.15–1.40)
Chloride: 105 mmol/L (ref 98–111)
Creatinine, Ser: 2.9 mg/dL — ABNORMAL HIGH (ref 0.61–1.24)
Glucose, Bld: 131 mg/dL — ABNORMAL HIGH (ref 70–99)
HCT: 41 % (ref 39.0–52.0)
Hemoglobin: 13.9 g/dL (ref 13.0–17.0)
Potassium: 3.7 mmol/L (ref 3.5–5.1)
Sodium: 134 mmol/L — ABNORMAL LOW (ref 135–145)
TCO2: 17 mmol/L — ABNORMAL LOW (ref 22–32)

## 2021-12-28 LAB — TROPONIN I (HIGH SENSITIVITY)
Troponin I (High Sensitivity): 20 ng/L — ABNORMAL HIGH (ref <18)
Troponin I (High Sensitivity): 16 ng/L (ref <18)

## 2021-12-28 LAB — MRSA NEXT GEN BY PCR, NASAL: MRSA by PCR Next Gen: NOT DETECTED

## 2021-12-28 LAB — LIPASE, BLOOD: Lipase: 25 U/L (ref 11–51)

## 2021-12-28 MED ORDER — SODIUM CHLORIDE 0.9% IV SOLUTION: Freq: Once | INTRAVENOUS | Status: AC

## 2021-12-28 MED ORDER — DOCUSATE SODIUM 100 MG PO CAPS: 100.0000 mg | ORAL_CAPSULE | Freq: Two times a day (BID) | ORAL | Status: DC | PRN

## 2021-12-28 MED ORDER — FENTANYL CITRATE (PF) 100 MCG/2ML IJ SOLN
25.0000 ug | INTRAMUSCULAR | Status: DC | PRN
  Administered 2021-12-28 – 2021-12-29 (×2): 25 ug via INTRAVENOUS
  Filled 2021-12-28 (×2): qty 2

## 2021-12-28 MED ORDER — POLYETHYLENE GLYCOL 3350 17 G PO PACK: 17.0000 g | PACK | Freq: Every day | ORAL | Status: DC | PRN

## 2021-12-28 MED ORDER — FENTANYL CITRATE PF 50 MCG/ML IJ SOSY
25.0000 ug | PREFILLED_SYRINGE | Freq: Once | INTRAMUSCULAR | Status: AC
  Administered 2021-12-28: 25 ug via INTRAVENOUS
  Filled 2021-12-28: qty 1

## 2021-12-28 MED ORDER — IOHEXOL 350 MG/ML SOLN
100.0000 mL | Freq: Once | INTRAVENOUS | Status: AC | PRN
  Administered 2021-12-28: 100 mL via INTRAVENOUS

## 2021-12-28 MED ORDER — NOREPINEPHRINE 4 MG/250ML-% IV SOLN
0.0000 ug/min | INTRAVENOUS | Status: DC
  Administered 2021-12-28: 2 ug/min via INTRAVENOUS
  Filled 2021-12-28: qty 250

## 2021-12-28 MED ORDER — PIPERACILLIN-TAZOBACTAM 3.375 G IVPB
3.3750 g | Freq: Three times a day (TID) | INTRAVENOUS | Status: DC
  Administered 2021-12-28 – 2022-01-01 (×12): 3.375 g via INTRAVENOUS
  Filled 2021-12-28 (×12): qty 50

## 2021-12-28 MED ORDER — CEFOXITIN SODIUM 2 G IV SOLR
2.0000 g | INTRAVENOUS | Status: DC
  Filled 2021-12-28 (×2): qty 2

## 2021-12-28 MED ORDER — FAMOTIDINE 20 MG PO TABS
20.0000 mg | ORAL_TABLET | Freq: Two times a day (BID) | ORAL | Status: DC
  Administered 2021-12-28 – 2021-12-29 (×3): 20 mg via ORAL
  Filled 2021-12-28 (×3): qty 1

## 2021-12-28 MED ORDER — NOREPINEPHRINE 4 MG/250ML-% IV SOLN
2.0000 ug/min | INTRAVENOUS | Status: DC
  Administered 2021-12-28: 15 ug/min via INTRAVENOUS
  Administered 2021-12-28: 6 ug/min via INTRAVENOUS
  Administered 2021-12-29: 2 ug/min via INTRAVENOUS
  Filled 2021-12-28 (×3): qty 250

## 2021-12-28 MED ORDER — LACTATED RINGERS IV SOLN: INTRAVENOUS | Status: DC

## 2021-12-28 MED ORDER — VANCOMYCIN VARIABLE DOSE PER UNSTABLE RENAL FUNCTION (PHARMACIST DOSING): Status: DC

## 2021-12-28 MED ORDER — PIPERACILLIN-TAZOBACTAM 3.375 G IVPB 30 MIN
3.3750 g | Freq: Once | INTRAVENOUS | Status: AC
  Administered 2021-12-28: 3.375 g via INTRAVENOUS
  Filled 2021-12-28: qty 50

## 2021-12-28 MED ORDER — SODIUM CHLORIDE 0.9 % IV SOLN
250.0000 mL | INTRAVENOUS | Status: DC
  Administered 2021-12-28 – 2022-01-03 (×3): 250 mL via INTRAVENOUS

## 2021-12-28 MED ORDER — SODIUM CHLORIDE 0.9 % IV BOLUS
1000.0000 mL | Freq: Once | INTRAVENOUS | Status: AC
  Administered 2021-12-28: 1000 mL via INTRAVENOUS

## 2021-12-28 MED ORDER — VITAMIN K1 10 MG/ML IJ SOLN
10.0000 mg | Freq: Once | INTRAVENOUS | Status: AC
  Administered 2021-12-28: 10 mg via INTRAVENOUS
  Filled 2021-12-28: qty 1

## 2021-12-28 MED ORDER — VANCOMYCIN HCL 1750 MG/350ML IV SOLN
1750.0000 mg | Freq: Once | INTRAVENOUS | Status: AC
  Administered 2021-12-28: 1750 mg via INTRAVENOUS
  Filled 2021-12-28: qty 350

## 2021-12-28 MED ORDER — ONDANSETRON HCL 4 MG/2ML IJ SOLN: 4.0000 mg | Freq: Four times a day (QID) | INTRAMUSCULAR | Status: DC | PRN

## 2021-12-28 MED ORDER — SODIUM CHLORIDE 0.9 % IV BOLUS
600.0000 mL | Freq: Once | INTRAVENOUS | Status: AC
  Administered 2021-12-28: 600 mL via INTRAVENOUS

## 2021-12-28 NOTE — H&P (Signed)
NAME:  [name], MRN:  [id number], DOB:  06/24/1946, LOS: 0 ADMISSION DATE:  12/28/2021, CONSULTATION DATE: 12/29/2018. REFERRING MD: ER physician, CHIEF COMPLAINT: Sepsis  History of Present Illness:  76 year old male who presents to the hospital several days general malaise, fever, abdominal pain more focused in the right side.  He is notable for admission for supratherapeutic INR which he takes Coumadin for atrial fibrillation.  CT scan of the abdomen demonstrated enlarged gallbladder consistent with gallbladder.Question thrombus versus unopacified blood within LEFT atrial appendage; can assess by echocardiography.Cholelithiasis with calculi, wall thickening and surrounding inflammatory changes highly suspicious for acute cholecystitis    Pertinent  Medical History   Past Medical History:  Diagnosis Date   Allergic rhinitis 08/08/2013   takes Loratadine daily    Aortic stenosis 09/08/2011   With mild aortic regurgitation, mean gradient 13 mmHg    Cataract    right but immature   Complication of anesthesia    stopped breathing - during shoulder surgery 2009-2010   Coronary artery disease 10/02/2009   Cardiac cath (October 2007): 20% left main, 60% mid LAD, 60-70% distal LAD, 30-40% first diagonal, 30% circumflex, 40% OM, 30-40% RCA    Degenerative joint disease of shoulder 08/03/2013   Bilateral, s/p right total shoulder arthroplasty 05/29/2011 and left shoulder arthroplasty 27/25/3664   Diastolic dysfunction 40/08/4740   Echo (04/04/2016): Grade I   Diverticulosis 10/07/2013   Seen on colonoscopy in 2007    Erectile dysfunction 05/07/2009   Essential hypertension 08/03/2013   had been on Lisinopril but stopped per MD   First degree AV block 03/14/2016   Gastric ulcer 10/07/2013   Seen on EGD 04/10/2006    Hemorrhoids 10/07/2013   Hyperlipidemia 10/02/2009   Paroxysmal atrial fibrillation ([place]) 07/14/2006   One episode, provoked by alcohol use disorder, briefly anticoagulated with  warfarin, then switched to aspirin alone   Sciatica associated with disorder of lumbar spine 08/03/2013   Anterolisthesis with right L 4-5 nerve root compression.  Treated with epidural injections and Physical Therapy.    Seborrheic keratosis 10/07/2013   Tubular adenoma of colon      Significant Hospital Events: Including procedures, antibiotic start and stop dates in addition to other pertinent events   12/28/2021 admitted for  Interim History / Subjective:  Also with acute renal failure, supratherapeutic INR, CT scan notable for enlarged gallbladder Objective   Blood pressure 98/72, pulse 75, temperature 98 F (36.7 C), temperature source Rectal, resp. rate 19, height '5\' 6"'$  (1.676 m), weight 84.4 kg, SpO2 94 %.        Intake/Output Summary (Last 24 hours) at 12/28/2021 1130 Last data filed at 12/28/2021 1105 Gross per 24 hour  Intake 2657.2 ml  Output --  Net 2657.2 ml   Filed Weights   12/28/21 0936  Weight: 84.4 kg    Examination: General: Elderly male no acute distress   HENT: No JVD or lymphadenopathy oropharynx IS dry Lungs: Decreased breath sounds in the bases Cardiovascular: Heart sounds are irregular Abdomen: Soft tender especially right Extremities: Warm without edema Neuro: Grossly intact  Resolved Hospital Problem list     Assessment & Plan:  Patient sepsis from gallbladder to there is a large gb CT scan. Admit to intensive care unit Empirical antimicrobial therapy with vancomycin Panculture Fluid resuscitation Weanable vasopressors as needed May need extra fluid as he appears dry IR consult for perq drain placement in am,   Acute renal insufficiency Lab Results  Component Value Date  CREATININE 2.90 (H) 12/28/2021   CREATININE 3.08 (H) 12/28/2021   CREATININE 1.41 (H) 10/10/2021   CREATININE 1.01 11/13/2014   CREATININE 1.03 10/13/2014   CREATININE 1.14 08/22/2013   Fluid resuscitation Avoid nephrotoxins Repeat creatinine  Supratherapeutic  INR Lab Results  Component Value Date   INR 7.3 (HH) 12/28/2021   INR 3.3 (A) 12/10/2021   INR 2.8 10/30/2021   Hold Coumadin reversal with FFP prior to surgical intervention  Chronic atrial fibrillation on Coumadin INR now supratherapeutic holding Coumadin  Question thrombus versus unopacified blood within LEFT atrial appendage;   2d echo    Best Practice (right click and "Reselect all SmartList Selections" daily)   Diet/type: NPO DVT prophylaxis: other GI prophylaxis: PPI Lines: N/A Foley:  N/A Code Status:  full code Last date of multidisciplinary goals of care discussion [to be determined]  Labs   CBC: Recent Labs  Lab 12/28/21 0943 12/28/21 0959  WBC 30.2*  --   NEUTROABS 27.1*  --   HGB 14.2 13.9  HCT 40.9 41.0  MCV 92.5  --   PLT 198  --     Basic Metabolic Panel: Recent Labs  Lab 12/28/21 0943 12/28/21 0959  NA 136 134*  K 3.7 3.7  CL 102 105  CO2 18*  --   GLUCOSE 138* 131*  BUN 33* 33*  CREATININE 3.08* 2.90*  CALCIUM 9.1  --    GFR: Estimated Creatinine Clearance: 22.8 mL/min (A) (by C-G formula based on SCr of 2.9 mg/dL (H)). Recent Labs  Lab 12/28/21 0943  WBC 30.2*  LATICACIDVEN 5.0*    Liver Function Tests: Recent Labs  Lab 12/28/21 0943  AST 28  ALT 13  ALKPHOS 67  BILITOT 2.3*  PROT 7.0  ALBUMIN 3.0*   Recent Labs  Lab 12/28/21 0943  LIPASE 25   No results for input(s): "AMMONIA" in the last 168 hours.  ABG    Component Value Date/Time   TCO2 17 (L) 12/28/2021 0959     Coagulation Profile: Recent Labs  Lab 12/28/21 0943  INR 7.3*    Cardiac Enzymes: No results for input(s): "CKTOTAL", "CKMB", "CKMBINDEX", "TROPONINI" in the last 168 hours.  HbA1C: Hemoglobin A1C  Date/Time Value Ref Range Status  07/29/2021 09:49 AM 5.7 (A) 4.0 - 5.6 % Final  03/12/2020 09:20 AM 5.6 4.0 - 5.6 % Final    CBG: No results for input(s): "GLUCAP" in the last 168 hours.  Review of Systems:   10 point review of  system taken, please see HPI for positives and negatives. Positive for abdominal pain.  Positive for altered mental status.  Past Medical History:  He,  has a past medical history of Allergic rhinitis (08/08/2013), Aortic stenosis (09/08/2011), Cataract, Complication of anesthesia, Coronary artery disease (10/02/2009), Degenerative joint disease of shoulder (09/30/3534), Diastolic dysfunction (14/09/3152), Diverticulosis (10/07/2013), Erectile dysfunction (05/07/2009), Essential hypertension (08/03/2013), First degree AV block (03/14/2016), Gastric ulcer (10/07/2013), Hemorrhoids (10/07/2013), Hyperlipidemia (10/02/2009), Paroxysmal atrial fibrillation ([place]) (07/14/2006), Sciatica associated with disorder of lumbar spine (08/03/2013), Seborrheic keratosis (10/07/2013), and Tubular adenoma of colon.   Surgical History:   Past Surgical History:  Procedure Laterality Date   CARDIAC CATHETERIZATION  2007   CARDIOVERSION     CARDIOVERSION N/A 08/04/2019   Procedure: CARDIOVERSION;  Surgeon: Fay Records, MD;  Location: [place];  Service: Cardiovascular;  Laterality: N/A;   COLONOSCOPY     INSERTION OF MESH N/A 03/11/2019   Procedure: Insertion Of Mesh;  Surgeon: Ralene Ok, MD;  Location: [facility]  OR;  Service: General;  Laterality: N/A;   JOINT REPLACEMENT     KNEE ARTHROSCOPY WITH MENISCAL REPAIR Right 01/30/2011   right finger surgery     as a child   TONSILLECTOMY     TOTAL KNEE ARTHROPLASTY Left 07/28/2017   Procedure: LEFT TOTAL KNEE ARTHROPLASTY;  Surgeon: Renette Butters, MD;  Location: [place];  Service: Orthopedics;  Laterality: Left;   TOTAL SHOULDER ARTHROPLASTY  05/29/2011   Procedure: TOTAL SHOULDER ARTHROPLASTY;  Surgeon: Metta Clines Supple;  Location: [place];  Service: Orthopedics;  Laterality: Right;   TOTAL SHOULDER ARTHROPLASTY Left 06/08/2014   DR SUPPLE   TOTAL SHOULDER ARTHROPLASTY Left 06/08/2014   Procedure: LEFT TOTAL SHOULDER ARTHROPLASTY;  Surgeon: Marin Shutter, MD;  Location: [place];   Service: Orthopedics;  Laterality: Left;   UMBILICAL HERNIA REPAIR N/A 03/11/2019   Procedure: LAPAROSCOPIC UMBILICAL HERNIA;  Surgeon: Ralene Ok, MD;  Location: [place];  Service: General;  Laterality: N/A;     Social History:   reports that he quit smoking about 11 years ago. His smoking use included cigarettes. He started smoking about 53 years ago. He has a 63.00 pack-year smoking history. He has never used smokeless tobacco. He reports that he does not drink alcohol and does not use drugs.   Family History:  His family history includes Bradycardia (age of onset: 16) in his mother; Coronary artery disease (age of onset: 75) in his brother; Coronary artery disease (age of onset: 85) in his father; Healthy in his son; Obesity in his daughter.   Allergies No Known Allergies   Home Medications  Prior to Admission medications   Medication Sig Start Date End Date Taking? Authorizing Provider  acetaminophen (TYLENOL) 500 MG tablet Take 500 mg by mouth every 6 (six) hours as needed for moderate pain.    [provider]  allopurinol (ZYLOPRIM) 100 MG tablet TAKE 2 TABLETS EVERY DAY (DOSE INCREASE) 05/27/21   Axel Filler, MD  atorvastatin (LIPITOR) 40 MG tablet TAKE 1 TABLET EVERY DAY 08/14/21   Axel Filler, MD  carvedilol (COREG) 6.25 MG tablet TAKE 1 TABLET TWICE DAILY WITH MEALS (NEED MD APPOINTMENT) 11/19/21   Lelon Perla, MD  furosemide (LASIX) 20 MG tablet TAKE 1 TABLET EVERY DAY 11/19/21   Lelon Perla, MD  losartan (COZAAR) 50 MG tablet TAKE 1 TABLET EVERY DAY 03/20/21   Lelon Perla, MD  warfarin (COUMADIN) 5 MG tablet Take 1-2 tablets by mouth daily or as directed by Coumadin clinic 11/19/21   Lelon Perla, MD     Critical care time: 4 min    Richardson Landry Dyan Creelman ACNP Acute Care Nurse Practitioner [place] Please consult Amion 12/28/2021, 11:31 AM

## 2021-12-28 NOTE — ED Notes (Signed)
Increased [place] to 4L

## 2021-12-28 NOTE — Discharge Instructions (Addendum)
Patient sent to hospital via EMS.  

## 2021-12-28 NOTE — ED Notes (Signed)
Got patient undressed into a gown on the monitor did ekg shown to er provider patient is resting with call bell in reach

## 2021-12-28 NOTE — ED Notes (Signed)
Patient is being discharged from the Urgent Care and sent to the Emergency Department via ems . Per Hailey, np, patient is in need of higher level of care due to symptoms and vital signs. Patient is aware and verbalizes understanding of plan of care.  Vitals:   12/28/21 0853 12/28/21 0900  BP:  (!) 90/52  Pulse:  68  Resp:  18  Temp: (!) 96.2 F (35.7 C)   SpO2:  91%

## 2021-12-28 NOTE — H&P (Incomplete)
NAME:  [name], MRN:  [id number], DOB:  02-23-1946, LOS: 0 ADMISSION DATE:  12/28/2021, CONSULTATION DATE:  *** REFERRING MD:  ***, CHIEF COMPLAINT:  ***   History of Present Illness:  ***  Pertinent  Medical History  ***  Significant Hospital Events: Including procedures, antibiotic start and stop dates in addition to other pertinent events     Interim History / Subjective:  ***  Objective   Blood pressure 98/72, pulse 75, temperature 98 F (36.7 C), temperature source Rectal, resp. rate 19, height '5\' 6"'$  (1.676 m), weight 84.4 kg, SpO2 94 %.        Intake/Output Summary (Last 24 hours) at 12/28/2021 1124 Last data filed at 12/28/2021 1105 Gross per 24 hour  Intake 2657.2 ml  Output --  Net 2657.2 ml   Filed Weights   12/28/21 0936  Weight: 84.4 kg    Examination: General: *** HENT: *** Lungs: *** Cardiovascular: *** Abdomen: *** Extremities: *** Neuro: *** GU: ***  Resolved Hospital Problem list   ***  Assessment & Plan:  ***  Best Practice (right click and "Reselect all SmartList Selections" daily)   Diet/type: {diet type:25684} DVT prophylaxis: {anticoagulation (Optional):25687} GI prophylaxis: {DX:83382} Lines: {Central Venous Access:25771} Foley:  {Central Venous Access:25691} Code Status:  {Code Status:26939} Last date of multidisciplinary goals of care discussion [***]  Labs   CBC: Recent Labs  Lab 12/28/21 0943 12/28/21 0959  WBC 30.2*  --   NEUTROABS 27.1*  --   HGB 14.2 13.9  HCT 40.9 41.0  MCV 92.5  --   PLT 198  --     Basic Metabolic Panel: Recent Labs  Lab 12/28/21 0943 12/28/21 0959  NA 136 134*  K 3.7 3.7  CL 102 105  CO2 18*  --   GLUCOSE 138* 131*  BUN 33* 33*  CREATININE 3.08* 2.90*  CALCIUM 9.1  --    GFR: Estimated Creatinine Clearance: 22.8 mL/min (A) (by C-G formula based on SCr of 2.9 mg/dL (H)). Recent Labs  Lab 12/28/21 0943  WBC 30.2*  LATICACIDVEN 5.0*    Liver Function Tests: Recent  Labs  Lab 12/28/21 0943  AST 28  ALT 13  ALKPHOS 67  BILITOT 2.3*  PROT 7.0  ALBUMIN 3.0*   Recent Labs  Lab 12/28/21 0943  LIPASE 25   No results for input(s): "AMMONIA" in the last 168 hours.  ABG    Component Value Date/Time   TCO2 17 (L) 12/28/2021 0959     Coagulation Profile: Recent Labs  Lab 12/28/21 0943  INR 7.3*    Cardiac Enzymes: No results for input(s): "CKTOTAL", "CKMB", "CKMBINDEX", "TROPONINI" in the last 168 hours.  HbA1C: Hemoglobin A1C  Date/Time Value Ref Range Status  07/29/2021 09:49 AM 5.7 (A) 4.0 - 5.6 % Final  03/12/2020 09:20 AM 5.6 4.0 - 5.6 % Final    CBG: No results for input(s): "GLUCAP" in the last 168 hours.  Review of Systems:   10 point review of system taken, please see HPI for positives and negatives.   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   Surgical History:   Past Surgical History:  Procedure Laterality Date   CARDIAC CATHETERIZATION  2007   CARDIOVERSION     CARDIOVERSION N/A 08/04/2019   Procedure: CARDIOVERSION;  Surgeon: Fay Records, MD;  Location: [place];  Service: Cardiovascular;  Laterality: N/A;   COLONOSCOPY     INSERTION OF MESH N/A 03/11/2019   Procedure: Insertion Of Mesh;  Surgeon: Ralene Ok, MD;  Location: [place];  Service: General;  Laterality: N/A;   JOINT REPLACEMENT      KNEE ARTHROSCOPY WITH MENISCAL REPAIR Right 01/30/2011   right finger surgery     as a child   TONSILLECTOMY     TOTAL KNEE ARTHROPLASTY Left 07/28/2017   Procedure: LEFT TOTAL KNEE ARTHROPLASTY;  Surgeon: Renette Butters, MD;  Location: [place];  Service: Orthopedics;  Laterality: Left;   TOTAL SHOULDER ARTHROPLASTY  05/29/2011   Procedure: TOTAL SHOULDER ARTHROPLASTY;  Surgeon: Metta Clines Supple;  Location: [place];  Service: Orthopedics;  Laterality: Right;   TOTAL SHOULDER ARTHROPLASTY Left 06/08/2014   DR SUPPLE   TOTAL SHOULDER ARTHROPLASTY Left 06/08/2014   Procedure: LEFT TOTAL SHOULDER ARTHROPLASTY;  Surgeon: Marin Shutter, MD;  Location: [place];  Service: Orthopedics;  Laterality: Left;   UMBILICAL HERNIA REPAIR N/A 03/11/2019   Procedure: LAPAROSCOPIC UMBILICAL HERNIA;  Surgeon: Ralene Ok, MD;  Location: [place];  Service: General;  Laterality: N/A;     Social History:   reports that he quit smoking about 11 years ago. His smoking use included cigarettes. He started smoking about 53 years ago. He has a 63.00 pack-year smoking history. He has never used smokeless tobacco. He reports that he does not drink alcohol and does not use drugs.   Family History:  His family history includes Bradycardia (age of onset: 69) in his mother; Coronary artery disease (age of onset: 36) in his brother; Coronary artery disease (age of onset: 39) in his father; Healthy in his son; Obesity in his daughter.   Allergies No Known Allergies   Home Medications  Prior to Admission medications   Medication Sig Start Date End Date Taking? Authorizing Provider  acetaminophen (TYLENOL) 500 MG tablet Take 500 mg by mouth every 6 (six) hours as needed for moderate pain.    [provider]  allopurinol (ZYLOPRIM) 100 MG tablet TAKE 2 TABLETS EVERY DAY (DOSE INCREASE) 05/27/21   Axel Filler, MD  atorvastatin (LIPITOR) 40 MG tablet TAKE 1 TABLET EVERY DAY 08/14/21   Axel Filler, MD   carvedilol (COREG) 6.25 MG tablet TAKE 1 TABLET TWICE DAILY WITH MEALS (NEED MD APPOINTMENT) 11/19/21   Lelon Perla, MD  furosemide (LASIX) 20 MG tablet TAKE 1 TABLET EVERY DAY 11/19/21   Lelon Perla, MD  losartan (COZAAR) 50 MG tablet TAKE 1 TABLET EVERY DAY 03/20/21   Lelon Perla, MD  warfarin (COUMADIN) 5 MG tablet Take 1-2 tablets by mouth daily or as directed by Coumadin clinic 11/19/21   Lelon Perla, MD     Critical care time: 71 min    Richardson Landry Cesia Orf ACNP Acute Care Nurse Practitioner [place] Please consult Amion 12/28/2021, 11:26 AM

## 2021-12-28 NOTE — ED Triage Notes (Addendum)
Patient presents to Urgent Care with complaints of abdominal pain, chest pain, sob and no bm for 4 days. Pt reports he took a laxitive last night and today he had his first bm that was loose and black. .  Pt pale and week, bp unable to be obtained with electric cuff  Hailey np notified and mannual cuff recived. Mellisa ma called 911.

## 2021-12-28 NOTE — Consult Note (Signed)
Chief Complaint: Patient was seen in consultation today for acute cholecystitis with sepsis Chief Complaint  Patient presents with   GI Bleeding   at the request of Reather Laurence   Referring Physician(s): Reather Laurence   Supervising Physician: Jacqulynn Cadet  Patient Status: [facility] - ED  History of Present Illness: [name] is a 76 y.o. male with PMHs of HTN, paroxysmal atrial fibrillation, hyperlipidemia, CAD, on Coumadin who presents to [facility] ED today due to several days of malaise, fever, right-sided abdominal pain.   In ED, patient was found to have WBC 30.2 and supratherapeutic INR 7.3, also found to be hypotensive, bradycardic, and hypoxic, placed on pressors. Patient underwent aortic dissection/GI bleed work-up as he reported chest pain and having dark-colored stool 4 days ago,  CTA chest abdomen pelvis showed no evidence of acute GI bleeding but it showed cholelithiasis with wall thickening and surrounding inflammatory changes highly suspicious for acute cholecystitis.  Ultrasound right upper quadrant again imaging finding suggestive of acute cholecystitis.   Patient is currently being admitted under the care of PCCM, and general surgery was consulted for management of the acute cholecystitis who recommended percutaneous cholecystotomy tube placement.   IR was requested for image guided perc chole placement, case was reviewed and approved by Dr. Laurence Ferrari; however, supratherapeutic INR needs to be corrected to minimize intra and postprocedural bleeding risk.  Patient seen in the ED. Patient laying in bed, not in acute distress.  RN at bedside. Patient is alert and oriented, does not appear ill.  He reports right upper quadrant abdominal pain especially when he takes in a deep breath.  Patient states he becomes short of breath occasionally due to abdominal pain, he is able to speak full sentences without problem. Denise headache, chills, nausea ,vomiting, and  bleeding.  Discussed with patient regarding imaging findings suggestive of acute cholecystitis, recommended percutaneous cholecystotomy tube placement which patient agrees to proceed. Informed the patient that his blood is currently "too thin," he will receive medications overnight to make his blood " thick enough" and IR will proceed with the procedure tomorrow.  Patient verbalized understanding.   Past Medical History:  Diagnosis Date   Allergic rhinitis 08/08/2013   takes Loratadine daily    Aortic stenosis 09/08/2011   With mild aortic regurgitation, mean gradient 13 mmHg    Cataract    right but immature   Complication of anesthesia    stopped breathing - during shoulder surgery 2009-2010   Coronary artery disease 10/02/2009   Cardiac cath (October 2007): 20% left main, 60% mid LAD, 60-70% distal LAD, 30-40% first diagonal, 30% circumflex, 40% OM, 30-40% RCA    Degenerative joint disease of shoulder 08/03/2013   Bilateral, s/p right total shoulder arthroplasty 05/29/2011 and left shoulder arthroplasty 78/29/5621   Diastolic dysfunction 30/01/6577   Echo (04/04/2016): Grade I   Diverticulosis 10/07/2013   Seen on colonoscopy in 2007    Erectile dysfunction 05/07/2009   Essential hypertension 08/03/2013   had been on Lisinopril but stopped per MD   First degree AV block 03/14/2016   Gastric ulcer 10/07/2013   Seen on EGD 04/10/2006    Hemorrhoids 10/07/2013   Hyperlipidemia 10/02/2009   Paroxysmal atrial fibrillation ([place]) 07/14/2006   One episode, provoked by alcohol use disorder, briefly anticoagulated with warfarin, then switched to aspirin alone   Sciatica associated with disorder of lumbar spine 08/03/2013   Anterolisthesis with right L 4-5 nerve root compression.  Treated with epidural injections and Physical Therapy.  Seborrheic keratosis 10/07/2013   Tubular adenoma of colon     Past Surgical History:  Procedure Laterality Date   CARDIAC CATHETERIZATION  2007   CARDIOVERSION      CARDIOVERSION N/A 08/04/2019   Procedure: CARDIOVERSION;  Surgeon: Fay Records, MD;  Location: [place];  Service: Cardiovascular;  Laterality: N/A;   COLONOSCOPY     INSERTION OF MESH N/A 03/11/2019   Procedure: Insertion Of Mesh;  Surgeon: Ralene Ok, MD;  Location: [place];  Service: General;  Laterality: N/A;   JOINT REPLACEMENT     KNEE ARTHROSCOPY WITH MENISCAL REPAIR Right 01/30/2011   right finger surgery     as a child   TONSILLECTOMY     TOTAL KNEE ARTHROPLASTY Left 07/28/2017   Procedure: LEFT TOTAL KNEE ARTHROPLASTY;  Surgeon: Renette Butters, MD;  Location: [place];  Service: Orthopedics;  Laterality: Left;   TOTAL SHOULDER ARTHROPLASTY  05/29/2011   Procedure: TOTAL SHOULDER ARTHROPLASTY;  Surgeon: Metta Clines Supple;  Location: [place];  Service: Orthopedics;  Laterality: Right;   TOTAL SHOULDER ARTHROPLASTY Left 06/08/2014   DR SUPPLE   TOTAL SHOULDER ARTHROPLASTY Left 06/08/2014   Procedure: LEFT TOTAL SHOULDER ARTHROPLASTY;  Surgeon: Marin Shutter, MD;  Location: [place];  Service: Orthopedics;  Laterality: Left;   UMBILICAL HERNIA REPAIR N/A 03/11/2019   Procedure: LAPAROSCOPIC UMBILICAL HERNIA;  Surgeon: Ralene Ok, MD;  Location: [place];  Service: General;  Laterality: N/A;    Allergies: Patient has no known allergies.  Medications: Prior to Admission medications   Medication Sig Start Date End Date Taking? Authorizing Provider  acetaminophen (TYLENOL) 500 MG tablet Take 500 mg by mouth every 6 (six) hours as needed for moderate pain.    [provider]  allopurinol (ZYLOPRIM) 100 MG tablet TAKE 2 TABLETS EVERY DAY (DOSE INCREASE) 05/27/21   Axel Filler, MD  atorvastatin (LIPITOR) 40 MG tablet TAKE 1 TABLET EVERY DAY 08/14/21   Axel Filler, MD  carvedilol (COREG) 6.25 MG tablet TAKE 1 TABLET TWICE DAILY WITH MEALS (NEED MD APPOINTMENT) 11/19/21   Lelon Perla, MD  furosemide (LASIX) 20 MG tablet TAKE 1 TABLET EVERY DAY 11/19/21    Lelon Perla, MD  losartan (COZAAR) 50 MG tablet TAKE 1 TABLET EVERY DAY 03/20/21   Lelon Perla, MD  warfarin (COUMADIN) 5 MG tablet Take 1-2 tablets by mouth daily or as directed by Coumadin clinic 11/19/21   Lelon Perla, MD     Family History  Problem Relation Age of Onset   Coronary artery disease Father 90       Died of myocardial infarction   Bradycardia Mother 47       Requiring pacemeaker placement   Coronary artery disease Brother 32       Died of myocardial infarction   Obesity Daughter    Healthy Son     Social History   Socioeconomic History   Marital status: Married    Spouse name: Not on file   Number of children: Not on file   Years of education: Not on file   Highest education level: Not on file  Occupational History   Not on file  Tobacco Use   Smoking status: Former    Packs/day: 1.50    Years: 42.00    Total pack years: 63.00    Types: Cigarettes    Start date: 25    Quit date: 07/14/2010    Years since quitting: 11.4   Smokeless  tobacco: Never   Tobacco comments:    quit smoking about 82yr ago  Vaping Use   Vaping Use: Never used  Substance and Sexual Activity   Alcohol use: No    Comment: no alcohol since 07   Drug use: No   Sexual activity: Never    Birth control/protection: None  Other Topics Concern   Not on file  [place]  Former [employer], early years/pre   Social Determinants of Health   Financial Resource Strain: Not on file  Food Insecurity: Not on file  Transportation Needs: Not on file  Physical Activity: Not on file  Stress: Not on file  Social Connections: Not on file     Review of Systems: A 12 point ROS discussed and pertinent positives are indicated in the HPI above.  All other systems are negative.  Vital Signs: BP (!) 104/47   Pulse 81   Temp (!) 97.5 F (36.4 C) (Oral)   Resp (!) 23   Ht '5\' 6"'$  (1.676 m)   Wt 186  lb (84.4 kg)   SpO2 100%   BMI 30.02 kg/m    Physical Exam Vitals reviewed.  Constitutional:      General: He is not in acute distress.    Appearance: Normal appearance. He is not ill-appearing.  HENT:     Head: Normocephalic.     Mouth/Throat:     Mouth: Mucous membranes are dry.     Pharynx: Oropharynx is clear.  Cardiovascular:     Rate and Rhythm: Normal rate and regular rhythm.     Heart sounds: Normal heart sounds.  Pulmonary:     Effort: Pulmonary effort is normal.     Breath sounds: Normal breath sounds.  Abdominal:     General: Bowel sounds are normal.     Palpations: Abdomen is soft.     Tenderness: There is abdominal tenderness. There is no guarding or rebound.     Comments: RUQ tenderness   Musculoskeletal:     Cervical back: Neck supple.  Skin:    General: Skin is warm and dry.  Neurological:     Mental Status: He is alert and oriented to person, place, and time.  Psychiatric:        Mood and Affect: Mood normal.        Behavior: Behavior normal.        Judgment: Judgment normal.     MD Evaluation Airway: WNL Heart: WNL Abdomen: WNL Chest/ Lungs: WNL ASA  Classification: 3 Mallampati/Airway Score: One  Imaging: UKoreaAbdomen Limited RUQ (LIVER/GB)  Result Date: 12/28/2021 CLINICAL DATA:  Follow-up from CT a chest, abdomen and pelvis that showed findings consistent with acute cholecystitis. Patient being evaluated for chest and abdominal pain and GI bleeding. EXAM: ULTRASOUND ABDOMEN LIMITED RIGHT UPPER QUADRANT COMPARISON:  Current CTA chest, abdomen and pelvis. FINDINGS: Gallbladder: Gallbladder moderately distended. Wall measures 5 mm in thickness. Trace pericholecystic fluid around the fundus. No defined stones, but dependent sludge is evident. No sonographic Murphy's sign. Common bile duct: Diameter: 4-5 mm. Liver: No focal lesion identified. Within normal limits in parenchymal echogenicity. Portal vein is patent on color Doppler imaging with normal  direction of blood flow towards the liver. Other: None. IMPRESSION: 1. Sonographic findings correlate with the CT findings. Gallbladder is distended with mild wall thickening. There is a small amount of pericholecystic fluid. No defined stone, but dependent sludge is evident.  Findings support acute cholecystitis in the proper clinical setting. If clinical findings are equivocal, consider follow-up HIDA scan. Electronically Signed   By: Lajean Manes M.D.   On: 12/28/2021 11:41   CT Angio Chest/Abd/Pel for Dissection W and/or W/WO  Result Date: 12/28/2021 CLINICAL DATA:  Acute aortic syndrome suspected, chest and abdominal pain, GI bleed EXAM: CT ANGIOGRAPHY CHEST, ABDOMEN AND PELVIS TECHNIQUE: Non-contrast CT of the chest was initially obtained. Multidetector CT imaging through the chest, abdomen and pelvis was performed using the standard protocol during bolus administration of intravenous contrast. Multiplanar reconstructed images and MIPs were obtained and reviewed to evaluate the vascular anatomy. RADIATION DOSE REDUCTION: This exam was performed according to the departmental dose-optimization program which includes automated exposure control, adjustment of the mA and/or kV according to patient size and/or use of iterative reconstruction technique. CONTRAST:  124m OMNIPAQUE IOHEXOL 350 MG/ML SOLN IV COMPARISON:  Noncontrast CT chest 09/06/2007 FINDINGS: CTA CHEST FINDINGS Cardiovascular: Atherosclerotic calcifications aorta, proximal great vessels and coronary arteries. Aorta normal caliber. Heart size normal. No pericardial effusion. Question thrombus versus unopacified blood within LEFT atrial appendage. Normal aortic enhancement without dissection. Pulmonary arteries patent without evidence of pulmonary embolism. Mediastinum/Nodes: Esophagus unremarkable. Few normal sized mediastinal lymph nodes. Base of cervical region normal appearance. No thoracic adenopathy. Lungs/Pleura: Dependent atelectasis  posterior lower lobes. Lungs otherwise clear. No pulmonary infiltrate, pleural effusion, or pneumothorax. Musculoskeletal: BILATERAL shoulder prostheses. No acute osseous findings. Review of the MIP images confirms the above findings. CTA ABDOMEN AND PELVIS FINDINGS VASCULAR Aorta: Aorta normal caliber without aneurysm or dissection. Celiac: Minimal plaque at origin.  No significant narrowing. SMA: Mild plaque at origin.  Less than 50% narrowing. Renals: Plaque at origins of BILATERAL renal arteries, greater D% RIGHT, less than 50% LEFT. Accessory LEFT renal artery. IMA: Patent Inflow: Atherosclerotic calcifications with narrowing of RIGHT greater than LEFT external iliac arteries. Veins: Unremarkable Review of the MIP images confirms the above findings. NON-VASCULAR Hepatobiliary: Liver unremarkable. Gallbladder distended with dependent calculi, wall thickening and pericholecystic infiltrative changes suspicious for acute cholecystitis. No biliary dilatation. Pancreas: Normal appearance Spleen: Normal appearance Adrenals/Urinary Tract: Adrenal glands normal appearance. Small BILATERAL renal cysts; no follow-up imaging recommended. Tiny nonobstructing calculus upper pole RIGHT kidney. No solid renal mass, hydronephrosis, hydroureter, or ureteral calcification. Bladder unremarkable. Stomach/Bowel: Diverticulosis of descending and sigmoid colon without evidence of diverticulitis. Normal appendix. Stomach and bowel loops unremarkable. Lymphatic: No adenopathy Reproductive: Prostatic enlargement with gland measuring 5.8 x 5.6 x 5.3 cm (volume = 90 cm3) Other: No free air or free fluid.  No hernia. Musculoskeletal: Degenerative disc disease changes lumbar spine. Review of the MIP images confirms the above findings. IMPRESSION: No evidence of aortic aneurysm or dissection. Extensive atherosclerotic calcifications as above including coronary arteries. Question thrombus versus unopacified blood within LEFT atrial appendage;  can assess by echocardiography. Cholelithiasis with calculi, wall thickening and surrounding inflammatory changes highly suspicious for acute cholecystitis. Distal colonic diverticulosis without evidence of diverticulitis. Prostatic enlargement. Aortic Atherosclerosis (ICD10-I70.0). Electronically Signed   By: MLavonia DanaM.D.   On: 12/28/2021 10:44    Labs:  CBC: Recent Labs    07/29/21 0930 12/28/21 0943 12/28/21 0959  WBC 11.2* 30.2*  --   HGB 15.9 14.2 13.9  HCT 45.0 40.9 41.0  PLT 226 198  --     COAGS: Recent Labs    09/12/21 0825 10/30/21 0815 12/10/21 0812 12/28/21 0943  INR 2.6 2.8 3.3* 7.3*    BMP: Recent Labs  07/29/21 0930 09/30/21 1414 10/10/21 0905 12/28/21 0943 12/28/21 0959  NA 142 139 142 136 134*  K 4.5 4.2 4.4 3.7 3.7  CL 106 103 102 102 105  CO2 '23 21 23 '$ 18*  --   GLUCOSE 113* 80 123* 138* 131*  BUN 20 19 34* 33* 33*  CALCIUM 9.4 9.4 9.4 9.1  --   CREATININE 1.29* 1.19 1.41* 3.08* 2.90*  GFRNONAA  --   --   --  20*  --     LIVER FUNCTION TESTS: Recent Labs    12/28/21 0943  BILITOT 2.3*  AST 28  ALT 13  ALKPHOS 67  PROT 7.0  ALBUMIN 3.0*    TUMOR MARKERS: No results for input(s): "AFPTM", "CEA", "CA199", "CHROMGRNA" in the last 8760 hours.  Assessment and Plan: 76 y.o. male with sepsis most likely due to acute cholecystitis, who is in need of percutaneous cholecystostomy tube placement.   The procedure was reviewed and approved by Dr. Laurence Ferrari; however, currently patient has supratherapeutic INR of 7. Patient to be admitted to ICU and receive vitamin K and FFP to lower INR. Ideally, INR needs to be less than 1.5 for for perc chole placement but will proceed with higher INR as patient is septic and needs urgent source control.  Will review repeat INR tomorrow a.m.  Risks and benefits discussed with the patient including, but not limited to bleeding, infection, gallbladder perforation, bile leak, sepsis or even death.  All of  the patient's questions were answered, patient is agreeable to proceed. Consent signed and in IR.   The image guided percutaneous cholecystotomy tube placement is tentatively scheduled for tomorrow a.m. pending repeat INR result.   - N.p.o. since midnight - Please refrain from initiating AC/AP overnight - Will review repeat INR tomorrow a.m.   Thank you for this interesting consult.  I greatly enjoyed meeting [name] and look forward to participating in their care.  A copy of this report was sent to the requesting provider on this date.  Electronically Signed: Tera Mater, PA-C 12/28/2021, 12:30 PM   I spent a total of 20 Minutes    in face to face in clinical consultation, greater than 50% of which was counseling/coordinating care for perch chole placement.   This chart was dictated using voice recognition software.  Despite best efforts to proofread,  errors can occur which can change the documentation meaning.

## 2021-12-28 NOTE — ED Provider Notes (Signed)
[place] EMERGENCY DEPARTMENT Provider Note   CSN: [id number] Arrival date & time: 12/28/21  0932     History {Add pertinent medical, surgical, social history, OB history to HPI:1} Chief Complaint  Patient presents with   GI Bleeding    [name] is a 76 y.o. male.  HPI     Home Medications Prior to Admission medications   Medication Sig Start Date End Date Taking? Authorizing Provider  acetaminophen (TYLENOL) 500 MG tablet Take 500 mg by mouth every 6 (six) hours as needed for moderate pain.    [provider]  allopurinol (ZYLOPRIM) 100 MG tablet TAKE 2 TABLETS EVERY DAY (DOSE INCREASE) 05/27/21   Axel Filler, MD  atorvastatin (LIPITOR) 40 MG tablet TAKE 1 TABLET EVERY DAY 08/14/21   Axel Filler, MD  carvedilol (COREG) 6.25 MG tablet TAKE 1 TABLET TWICE DAILY WITH MEALS (NEED MD APPOINTMENT) 11/19/21   Lelon Perla, MD  furosemide (LASIX) 20 MG tablet TAKE 1 TABLET EVERY DAY 11/19/21   Lelon Perla, MD  losartan (COZAAR) 50 MG tablet TAKE 1 TABLET EVERY DAY 03/20/21   Lelon Perla, MD  warfarin (COUMADIN) 5 MG tablet Take 1-2 tablets by mouth daily or as directed by Coumadin clinic 11/19/21   Lelon Perla, MD      Allergies    Patient has no known allergies.    Review of Systems   Review of Systems  Physical Exam Updated Vital Signs BP (!) 84/54   Pulse 81   Temp 98 F (36.7 C) (Rectal)   Resp (!) 23   Ht '5\' 6"'$  (1.676 m)   Wt 84.4 kg   SpO2 100%   BMI 30.02 kg/m  Physical Exam  ED Results / Procedures / Treatments   Labs (all labs ordered are listed, but only abnormal results are displayed) Labs Reviewed  CBC WITH DIFFERENTIAL/PLATELET - Abnormal; Notable for the following components:      Result Value   WBC 30.2 (*)    All other components within normal limits  POC OCCULT BLOOD, ED - Abnormal; Notable for the following components:   Fecal Occult Bld POSITIVE (*)    All other  components within normal limits  I-STAT CHEM 8, ED - Abnormal; Notable for the following components:   Sodium 134 (*)    BUN 33 (*)    Creatinine, Ser 2.90 (*)    Glucose, Bld 131 (*)    Calcium, Ion 1.04 (*)    TCO2 17 (*)    All other components within normal limits  CULTURE, BLOOD (ROUTINE X 2)  CULTURE, BLOOD (ROUTINE X 2)  COMPREHENSIVE METABOLIC PANEL  PROTIME-INR  LIPASE, BLOOD  LACTIC ACID, PLASMA  LACTIC ACID, PLASMA  TYPE AND SCREEN  TROPONIN I (HIGH SENSITIVITY)    EKG EKG Interpretation  Date/Time:  Saturday December 28 2021 09:39:20 EDT Ventricular Rate:  88 PR Interval:    QRS Duration: 96 QT Interval:  391 QTC Calculation: 474 R Axis:   24 Text Interpretation: Atrial fibrillation Low voltage, precordial leads Confirmed by Thamas Jaegers ([id number]) on 12/28/2021 9:55:28 AM  Radiology No results found.  Procedures Procedures  {Document cardiac monitor, telemetry assessment procedure when appropriate:1}  Medications Ordered in ED Medications  sodium chloride 0.9 % bolus 600 mL (has no administration in time range)  piperacillin-tazobactam (ZOSYN) IVPB 3.375 g (has no administration in time range)  vancomycin (VANCOREADY) IVPB 1750 mg/350 mL (has no administration in time range)  sodium chloride 0.9 % bolus 1,000 mL (1,000 mLs Intravenous New Bag/Given 12/28/21 0954)  sodium chloride 0.9 % bolus 1,000 mL (1,000 mLs Intravenous New Bag/Given 12/28/21 0160)    ED Course/ Medical Decision Making/ A&P                           Medical Decision Making Amount and/or Complexity of Data Reviewed Labs: ordered. Radiology: ordered.  Risk Prescription drug management.   History obtained from family bedside.  Cardiac monitoring showing atrial fibrillation.  Review of record shows urgent care visit today for chest pain abdominal pain and hypotension.  Patient's labs today show elevated creatinine.  However I feel this patient's benefits far outweigh the risks and will  proceed to CT scan with IV contrast.  {Document critical care time when appropriate:1} {Document review of labs and clinical decision tools ie heart score, Chads2Vasc2 etc:1}  {Document your independent review of radiology images, and any outside records:1} {Document your discussion with family members, caretakers, and with consultants:1} {Document social determinants of health affecting pt's care:1} {Document your decision making why or why not admission, treatments were needed:1} Final Clinical Impression(s) / ED Diagnoses Final diagnoses:  None    Rx / DC Orders ED Discharge Orders     None

## 2021-12-28 NOTE — ED Notes (Signed)
Called to give report RN unable to take report and is to call back

## 2021-12-28 NOTE — Progress Notes (Signed)
Pharmacy Antibiotic Note  [name] is a 76 y.o. male for which pharmacy has been consulted for vancomycin dosing for sepsis.  Patient with a history of aortic stenosis, CAD, DJD of shoulder, diverticulosis, HTN, 1st degree AV block, gastric ulcer, hemorrhoids, HLD, AF on warfarin. Patient presenting with several days general malaise, fever, abdominal pain.  SCr 2.9 - AKI WBC 30.2; LA 2.9; T 97.5 F; HR 81; RR 23  Plan: Zosyn per MD Vancomycin 1750 mg once, subsequent dosing as indicated per random vancomycin level until renal function stable and/or improved, at which time scheduled dosing can be considered Trend WBC, Fever, Renal function, & Clinical course F/u cultures, clinical course, WBC, fever De-escalate when able  Height: '5\' 6"'$  (167.6 cm) Weight: 84.4 kg (186 lb) IBW/kg (Calculated) : 63.8  Temp (24hrs), Avg:96.8 F (36 C), Min:96.2 F (35.7 C), Max:98 F (36.7 C)  Recent Labs  Lab 12/28/21 0943 12/28/21 0959  WBC 30.2*  --   CREATININE  --  2.90*    Estimated Creatinine Clearance: 22.8 mL/min (A) (by C-G formula based on SCr of 2.9 mg/dL (H)).    No Known Allergies  Antimicrobials this admission: zosyn 7/1 >>  vancomycin 7/1 >>  Microbiology results: Pending  Thank you for allowing pharmacy to be a part of this patient's care.  Lorelei Pont, PharmD, BCPS 12/28/2021 10:03 AM ED Clinical Pharmacist -  [phone]

## 2021-12-28 NOTE — ED Notes (Signed)
Placed on 2L [place] due to oxygen 90-91%

## 2021-12-28 NOTE — ED Triage Notes (Signed)
RUQ pain for weeks.  Hx of gastric ulcer, on coumadin. Black tarry stools.  Hypotensive at [facility] and with EMS  Distended abd.  SBP 80s

## 2021-12-28 NOTE — Consult Note (Signed)
Reason for Consult/Chief Complaint: acuet cholecystitis Consultant: Almyra Free, MD  [name] is an 76 y.o. male.   HPI: 74M with several days of fever and R sided abdominal pain, presented in septic shock found to have acute cholecystitis on CT A/P. INR 7, LA 5, hypotensive on levophed, and question of LA thrombus on CT.   Past Medical History:  Diagnosis Date   Allergic rhinitis 08/08/2013   takes Loratadine daily    Aortic stenosis 09/08/2011   With mild aortic regurgitation, mean gradient 13 mmHg    Cataract    right but immature   Complication of anesthesia    stopped breathing - during shoulder surgery 2009-2010   Coronary artery disease 10/02/2009   Cardiac cath (October 2007): 20% left main, 60% mid LAD, 60-70% distal LAD, 30-40% first diagonal, 30% circumflex, 40% OM, 30-40% RCA    Degenerative joint disease of shoulder 08/03/2013   Bilateral, s/p right total shoulder arthroplasty 05/29/2011 and left shoulder arthroplasty 16/03/9603   Diastolic dysfunction 54/0/9811   Echo (04/04/2016): Grade I   Diverticulosis 10/07/2013   Seen on colonoscopy in 2007    Erectile dysfunction 05/07/2009   Essential hypertension 08/03/2013   had been on Lisinopril but stopped per MD   First degree AV block 03/14/2016   Gastric ulcer 10/07/2013   Seen on EGD 04/10/2006    Hemorrhoids 10/07/2013   Hyperlipidemia 10/02/2009   Paroxysmal atrial fibrillation ([place]) 07/14/2006   One episode, provoked by alcohol use disorder, briefly anticoagulated with warfarin, then switched to aspirin alone   Sciatica associated with disorder of lumbar spine 08/03/2013   Anterolisthesis with right L 4-5 nerve root compression.  Treated with epidural injections and Physical Therapy.    Seborrheic keratosis 10/07/2013   Tubular adenoma of colon     Past Surgical History:  Procedure Laterality Date   CARDIAC CATHETERIZATION  2007   CARDIOVERSION     CARDIOVERSION N/A 08/04/2019   Procedure: CARDIOVERSION;  Surgeon:  Fay Records, MD;  Location: [place];  Service: Cardiovascular;  Laterality: N/A;   COLONOSCOPY     INSERTION OF MESH N/A 03/11/2019   Procedure: Insertion Of Mesh;  Surgeon: Ralene Ok, MD;  Location: [place];  Service: General;  Laterality: N/A;   JOINT REPLACEMENT     KNEE ARTHROSCOPY WITH MENISCAL REPAIR Right 01/30/2011   right finger surgery     as a child   TONSILLECTOMY     TOTAL KNEE ARTHROPLASTY Left 07/28/2017   Procedure: LEFT TOTAL KNEE ARTHROPLASTY;  Surgeon: Renette Butters, MD;  Location: [place];  Service: Orthopedics;  Laterality: Left;   TOTAL SHOULDER ARTHROPLASTY  05/29/2011   Procedure: TOTAL SHOULDER ARTHROPLASTY;  Surgeon: Metta Clines Supple;  Location: [place];  Service: Orthopedics;  Laterality: Right;   TOTAL SHOULDER ARTHROPLASTY Left 06/08/2014   DR SUPPLE   TOTAL SHOULDER ARTHROPLASTY Left 06/08/2014   Procedure: LEFT TOTAL SHOULDER ARTHROPLASTY;  Surgeon: Marin Shutter, MD;  Location: [place];  Service: Orthopedics;  Laterality: Left;   UMBILICAL HERNIA REPAIR N/A 03/11/2019   Procedure: LAPAROSCOPIC UMBILICAL HERNIA;  Surgeon: Ralene Ok, MD;  Location[facility];  Service: General;  Laterality: N/A;    Family History  Problem Relation Age of Onset   Coronary artery disease Father 64       Died of myocardial infarction   Bradycardia Mother 45       Requiring pacemeaker placement   Coronary artery disease Brother 109  Died of myocardial infarction   Obesity Daughter    Healthy Son     Social History:  reports that he quit smoking about 11 years ago. His smoking use included cigarettes. He started smoking about 53 years ago. He has a 63.00 pack-year smoking history. He has never used smokeless tobacco. He reports that he does not drink alcohol and does not use drugs.  Allergies: No Known Allergies  Medications: I have reviewed the patient's current medications.  Results for orders placed or performed during the hospital encounter of 12/28/21  (from the past 48 hour(s))  Comprehensive metabolic panel     Status: Abnormal   Collection Time: 12/28/21  9:43 AM  Result Value Ref Range   Sodium 136 135 - 145 mmol/L   Potassium 3.7 3.5 - 5.1 mmol/L   Chloride 102 98 - 111 mmol/L   CO2 18 (L) 22 - 32 mmol/L   Glucose, Bld 138 (H) 70 - 99 mg/dL    Comment: Glucose reference range applies only to samples taken after fasting for at least 8 hours.   BUN 33 (H) 8 - 23 mg/dL   Creatinine, Ser 3.08 (H) 0.61 - 1.24 mg/dL   Calcium 9.1 8.9 - 10.3 mg/dL   Total Protein 7.0 6.5 - 8.1 g/dL   Albumin 3.0 (L) 3.5 - 5.0 g/dL   AST 28 15 - 41 U/L   ALT 13 0 - 44 U/L   Alkaline Phosphatase 67 38 - 126 U/L   Total Bilirubin 2.3 (H) 0.3 - 1.2 mg/dL   GFR, Estimated 20 (L) >60 mL/min    Comment: (NOTE) Calculated using the CKD-EPI Creatinine Equation (2021)    Anion gap 16 (H) 5 - 15    Comment: Performed at [facility], [place] [address] [place], [place] [address]  Type and screen [place]     Status: None   Collection Time: 12/28/21  9:43 AM  Result Value Ref Range   ABO/RH(D) B NEG    Antibody Screen NEG    Sample Expiration      12/31/2021,2359 Performed at [facility], [place] [address], [place], [place] [address]   Protime-INR - (order if Patient is taking Coumadin / Warfarin)     Status: Abnormal   Collection Time: 12/28/21  9:43 AM  Result Value Ref Range   Prothrombin Time 62.1 (H) 11.4 - 15.2 seconds   INR 7.3 (HH) 0.8 - 1.2    Comment: REPEATED TO VERIFY SPECIMEN CHECKED FOR CLOTS CRITICAL RESULT CALLED TO, READ BACK BY AND VERIFIED WITH: Cristopher Estimable RN 12/28/21 1034 LIN AUNG (NOTE) INR goal varies based on device and disease states. Performed at [facility], [place] [address], [place], [place] [address]   CBC with Differential     Status: Abnormal   Collection Time: 12/28/21  9:43 AM  Result Value Ref Range   WBC 30.2 (H) 4.0 - 10.5 K/uL   RBC 4.42 4.22 - 5.81 MIL/uL   Hemoglobin 14.2  13.0 - 17.0 g/dL   HCT 40.9 39.0 - 52.0 %   MCV 92.5 80.0 - 100.0 fL   MCH 32.1 26.0 - 34.0 pg   MCHC 34.7 30.0 - 36.0 g/dL   RDW 13.6 11.5 - 15.5 %   Platelets 198 150 - 400 K/uL   nRBC 0.0 0.0 - 0.2 %   Neutrophils Relative % 90 %   Neutro Abs 27.1 (H) 1.7 - 7.7 K/uL   Lymphocytes  Relative 5 %   Lymphs Abs 1.5 0.7 - 4.0 K/uL   Monocytes Relative 4 %   Monocytes Absolute 1.3 (H) 0.1 - 1.0 K/uL   Eosinophils Relative 0 %   Eosinophils Absolute 0.0 0.0 - 0.5 K/uL   Basophils Relative 0 %   Basophils Absolute 0.1 0.0 - 0.1 K/uL   WBC Morphology DOHLE BODIES     Comment: MILD LEFT SHIFT (1-5% METAS, OCC MYELO, OCC BANDS)   RBC Morphology POLYCHROMASIA PRESENT    Immature Granulocytes 1 %   Abs Immature Granulocytes 0.19 (H) 0.00 - 0.07 K/uL    Comment: Performed at [facility] [address] [place], [place] [address]  Lipase, blood     Status: None   Collection Time: 12/28/21  9:43 AM  Result Value Ref Range   Lipase 25 11 - 51 U/L    Comment: Performed at [facility] [address], [place], [place] [address]  Lactic acid, plasma     Status: Abnormal   Collection Time: 12/28/21  9:43 AM  Result Value Ref Range   Lactic Acid, Venous 5.0 (HH) 0.5 - 1.9 mmol/L    Comment: CRITICAL RESULT CALLED TO, READ BACK BY AND VERIFIED WITH: M.ROBERTS,RN 12/28/2021 AT 1034 AHUGHES Performed at [facility], [place] [address], [place], [place] [address]   Troponin I (High Sensitivity)     Status: Abnormal   Collection Time: 12/28/21  9:43 AM  Result Value Ref Range   Troponin I (High Sensitivity) 20 (H) <18 ng/L    Comment: (NOTE) Elevated high sensitivity troponin I (hsTnI) values and significant  changes across serial measurements may suggest ACS but many other  chronic and acute conditions are known to elevate hsTnI results.  Refer to the "Links" section for chest pain algorithms and additional  guidance. Performed at [facility], [place] [address],  [place], [place] [address]   POC occult blood, ED     Status: Abnormal   Collection Time: 12/28/21  9:51 AM  Result Value Ref Range   Fecal Occult Bld POSITIVE (A) NEGATIVE  I-stat chem 8, ED (not at [facility] or [facility])     Status: Abnormal   Collection Time: 12/28/21  9:59 AM  Result Value Ref Range   Sodium 134 (L) 135 - 145 mmol/L   Potassium 3.7 3.5 - 5.1 mmol/L   Chloride 105 98 - 111 mmol/L   BUN 33 (H) 8 - 23 mg/dL   Creatinine, Ser 2.90 (H) 0.61 - 1.24 mg/dL   Glucose, Bld 131 (H) 70 - 99 mg/dL    Comment: Glucose reference range applies only to samples taken after fasting for at least 8 hours.   Calcium, Ion 1.04 (L) 1.15 - 1.40 mmol/L   TCO2 17 (L) 22 - 32 mmol/L   Hemoglobin 13.9 13.0 - 17.0 g/dL   HCT 41.0 39.0 - 52.0 %  Prepare fresh frozen plasma     Status: None (Preliminary result)   Collection Time: 12/28/21 11:32 AM  Result Value Ref Range   Unit Number [id number]    Blood Component Type THW PLS APHR    Unit division B0    Status of Unit ALLOCATED    Transfusion Status      OK TO TRANSFUSE Performed at [facility] [address] [place], [place] [address]    Unit Number [id number]    Blood Component Type THAWED PLASMA    Unit division 00  Status of Unit ALLOCATED    Transfusion Status OK TO TRANSFUSE    Unit Number [id number]    Blood Component Type THAWED PLASMA    Unit division 00    Status of Unit ALLOCATED    Transfusion Status OK TO TRANSFUSE    Unit Number [id number]    Blood Component Type THW PLS APHR    Unit division A0    Status of Unit ALLOCATED    Transfusion Status OK TO TRANSFUSE   Lactic acid, plasma     Status: Abnormal   Collection Time: 12/28/21 11:54 AM  Result Value Ref Range   Lactic Acid, Venous 2.9 (HH) 0.5 - 1.9 mmol/L    Comment: CRITICAL VALUE NOTED.  VALUE IS CONSISTENT WITH PREVIOUSLY REPORTED AND CALLED VALUE. Performed at [facility], [place] [address] [place], [place] [address]   Troponin I (High  Sensitivity)     Status: None   Collection Time: 12/28/21 11:54 AM  Result Value Ref Range   Troponin I (High Sensitivity) 16 <18 ng/L    Comment: (NOTE) Elevated high sensitivity troponin I (hsTnI) values and significant  changes across serial measurements may suggest ACS but many other  chronic and acute conditions are known to elevate hsTnI results.  Refer to the "Links" section for chest pain algorithms and additional  guidance. Performed at [facility], [place] [address] [place], [place] [address]     US Abdomen Limited RUQ (LIVER/GB)  Result Date: 12/28/2021 CLINICAL DATA:  Follow-up from CT a chest, abdomen and pelvis that showed findings consistent with acute cholecystitis. Patient being evaluated for chest and abdominal pain and GI bleeding. EXAM: ULTRASOUND ABDOMEN LIMITED RIGHT UPPER QUADRANT COMPARISON:  Current CTA chest, abdomen and pelvis. FINDINGS: Gallbladder: Gallbladder moderately distended. Wall measures 5 mm in thickness. Trace pericholecystic fluid around the fundus. No defined stones, but dependent sludge is evident. No sonographic Murphy's sign. Common bile duct: Diameter: 4-5 mm. Liver: No focal lesion identified. Within normal limits in parenchymal echogenicity. Portal vein is patent on color Doppler imaging with normal direction of blood flow towards the liver. Other: None. IMPRESSION: 1. Sonographic findings correlate with the CT findings. Gallbladder is distended with mild wall thickening. There is a small amount of pericholecystic fluid. No defined stone, but dependent sludge is evident. Findings support acute cholecystitis in the proper clinical setting. If clinical findings are equivocal, consider follow-up HIDA scan. Electronically Signed   By: Lajean Manes M.D.   On: 12/28/2021 11:41   CT Angio Chest/Abd/Pel for Dissection W and/or W/WO  Result Date: 12/28/2021 CLINICAL DATA:  Acute aortic syndrome suspected, chest and abdominal pain, GI bleed EXAM: CT  ANGIOGRAPHY CHEST, ABDOMEN AND PELVIS TECHNIQUE: Non-contrast CT of the chest was initially obtained. Multidetector CT imaging through the chest, abdomen and pelvis was performed using the standard protocol during bolus administration of intravenous contrast. Multiplanar reconstructed images and MIPs were obtained and reviewed to evaluate the vascular anatomy. RADIATION DOSE REDUCTION: This exam was performed according to the departmental dose-optimization program which includes automated exposure control, adjustment of the mA and/or kV according to patient size and/or use of iterative reconstruction technique. CONTRAST:  165m OMNIPAQUE IOHEXOL 350 MG/ML SOLN IV COMPARISON:  Noncontrast CT chest 09/06/2007 FINDINGS: CTA CHEST FINDINGS Cardiovascular: Atherosclerotic calcifications aorta, proximal great vessels and coronary arteries. Aorta normal caliber. Heart size normal. No pericardial effusion. Question thrombus versus unopacified blood within LEFT atrial appendage. Normal aortic enhancement without dissection. Pulmonary arteries patent without evidence  of pulmonary embolism. Mediastinum/Nodes: Esophagus unremarkable. Few normal sized mediastinal lymph nodes. Base of cervical region normal appearance. No thoracic adenopathy. Lungs/Pleura: Dependent atelectasis posterior lower lobes. Lungs otherwise clear. No pulmonary infiltrate, pleural effusion, or pneumothorax. Musculoskeletal: BILATERAL shoulder prostheses. No acute osseous findings. Review of the MIP images confirms the above findings. CTA ABDOMEN AND PELVIS FINDINGS VASCULAR Aorta: Aorta normal caliber without aneurysm or dissection. Celiac: Minimal plaque at origin.  No significant narrowing. SMA: Mild plaque at origin.  Less than 50% narrowing. Renals: Plaque at origins of BILATERAL renal arteries, greater D% RIGHT, less than 50% LEFT. Accessory LEFT renal artery. IMA: Patent Inflow: Atherosclerotic calcifications with narrowing of RIGHT greater than  LEFT external iliac arteries. Veins: Unremarkable Review of the MIP images confirms the above findings. NON-VASCULAR Hepatobiliary: Liver unremarkable. Gallbladder distended with dependent calculi, wall thickening and pericholecystic infiltrative changes suspicious for acute cholecystitis. No biliary dilatation. Pancreas: Normal appearance Spleen: Normal appearance Adrenals/Urinary Tract: Adrenal glands normal appearance. Small BILATERAL renal cysts; no follow-up imaging recommended. Tiny nonobstructing calculus upper pole RIGHT kidney. No solid renal mass, hydronephrosis, hydroureter, or ureteral calcification. Bladder unremarkable. Stomach/Bowel: Diverticulosis of descending and sigmoid colon without evidence of diverticulitis. Normal appendix. Stomach and bowel loops unremarkable. Lymphatic: No adenopathy Reproductive: Prostatic enlargement with gland measuring 5.8 x 5.6 x 5.3 cm (volume = 90 cm3) Other: No free air or free fluid.  No hernia. Musculoskeletal: Degenerative disc disease changes lumbar spine. Review of the MIP images confirms the above findings. IMPRESSION: No evidence of aortic aneurysm or dissection. Extensive atherosclerotic calcifications as above including coronary arteries. Question thrombus versus unopacified blood within LEFT atrial appendage; can assess by echocardiography. Cholelithiasis with calculi, wall thickening and surrounding inflammatory changes highly suspicious for acute cholecystitis. Distal colonic diverticulosis without evidence of diverticulitis. Prostatic enlargement. Aortic Atherosclerosis (ICD10-I70.0). Electronically Signed   By: Lavonia Dana M.D.   On: 12/28/2021 10:44    ROS 10 point review of systems is negative except as listed above in HPI.   Physical Exam Blood pressure (!) 95/51, pulse 87, temperature (!) 97.5 F (36.4 C), temperature source Oral, resp. rate (!) 23, height '5\' 6"'$  (1.676 m), weight 86.3 kg, SpO2 94 %. Constitutional: well-developed,  well-nourished*** HEENT: pupils equal, round, reactive to light, 2***mm b/l, moist conjunctiva, external inspection of ears and nose normal, hearing {Blank single:19197::"intact","diminished","unable to be assessed"} Oropharynx: normal oropharyngeal mucosa, {Blank single:19197::"normal","poor"} dentition Neck: no thyromegaly, trachea midline, {Blank single:19197::"+","no","unable to assess"} midline cervical tenderness to palpation Chest: breath sounds equal bilaterally, {Blank single:19197::"normal","absent","labored"} respiratory effort, {Blank single:19197::"+","no"} midline or lateral chest wall tenderness to palpation/deformity Abdomen: soft, NT, no bruising, no hepatosplenomegaly GU: {Blank single:19197::"no blood at urethral meatus of penis, no scrotal masses or abnormality","normal male genitalia"}  Back: no wounds, {Blank single:19197::"+","no","unable to assess"} thoracic/lumbar spine tenderness to palpation, {Blank single:19197::"+","no"} thoracic/lumbar spine stepoffs Rectal: {Blank single:19197::"good tone, no blood","deferred"} Extremities: 2+ radial and pedal pulses bilaterally, {Blank single:19197::"intact","unable to assess"} motor and sensation bilateral UE and LE, {Blank single:19197::"+","no"} peripheral edema MSK: {Blank single:19197::"normal","abnormal","unable to assess"} gait/station, no clubbing/cyanosis of fingers/toes, {Blank single:19197::"normal","limited","unable to assess"} ROM of all four extremities Skin: warm, dry, no rashes Psych: {Blank single:19197::"normal memory, normal mood/affect","unable to assess"}     Assessment/Plan: Septic shock - biliary source, acute cholecystitis, too ill to tolerate surgery, recommend broad spectrum abx and source control with per chole tube Supratherapeutic INR - recommend urgent reversal ?LA thrombus - recommend echo FEN - NPO Dispo - ICU, surgery will continue to follow   Jesusita Oka,  MD General and [facility]

## 2021-12-28 NOTE — ED Provider Notes (Signed)
[facility]    CSN: [id number] Arrival date & time: 12/28/21  0851      History   Chief Complaint No chief complaint on file.   HPI [name] is a 76 y.o. male.   Patient presents for chest pain, abdominal pain, black colored stool, shortness of breath that started yesterday.  Patient reports that he has chest pain in the center that started at midnight.  He is not able to enumerate or characterize chest pain and states that it simply hurts.  Abdominal pain is also present but when asked where it is located patient points to the left side of the chest/right upper quadrant.  He states that his last normal bowel movement was about 4 days ago and this morning he had black-colored stool after taking a "laxative".  Denies any nausea or vomiting.  He does endorse some shortness of breath as well.  He reports that he has a history of atrial fibrillation that is persistent and takes Coumadin for this.  Denies headache, blurred vision, dizziness.     Past Medical History:  Diagnosis Date   Allergic rhinitis 08/08/2013   takes Loratadine daily    Aortic stenosis 09/08/2011   With mild aortic regurgitation, mean gradient 13 mmHg    Cataract    right but immature   Complication of anesthesia    stopped breathing - during shoulder surgery 2009-2010   Coronary artery disease 10/02/2009   Cardiac cath (October 2007): 20% left main, 60% mid LAD, 60-70% distal LAD, 30-40% first diagonal, 30% circumflex, 40% OM, 30-40% RCA    Degenerative joint disease of shoulder 08/03/2013   Bilateral, s/p right total shoulder arthroplasty 05/29/2011 and left shoulder arthroplasty 56/43/3295   Diastolic dysfunction 18/01/4165   Echo (04/04/2016): Grade I   Diverticulosis 10/07/2013   Seen on colonoscopy in 2007    Erectile dysfunction 05/07/2009   Essential hypertension 08/03/2013   had been on Lisinopril but stopped per MD   First degree AV block 03/14/2016   Gastric ulcer 10/07/2013   Seen on EGD  04/10/2006    Hemorrhoids 10/07/2013   Hyperlipidemia 10/02/2009   Paroxysmal atrial fibrillation ([place]) 07/14/2006   One episode, provoked by alcohol use disorder, briefly anticoagulated with warfarin, then switched to aspirin alone   Sciatica associated with disorder of lumbar spine 08/03/2013   Anterolisthesis with right L 4-5 nerve root compression.  Treated with epidural injections and Physical Therapy.    Seborrheic keratosis 10/07/2013   Tubular adenoma of colon     Patient Active Problem List   Diagnosis Date Noted   Dyspnea on exertion 07/29/2021   Acquired thrombophilia ([place]) 04/22/2019   Encounter for therapeutic drug monitoring 03/28/2019   Atrial fibrillation ([place]) 03/04/2019   Flat foot, acquired, left 01/24/2019   Gout 01/24/2019   Prediabetes 01/24/2019   Osteoarthritis of both knees 12/12/2014   Tubular adenoma 10/07/2013   Seborrheic keratosis 10/07/2013   Healthcare maintenance 10/07/2013   Allergic rhinitis 08/08/2013   Essential hypertension 08/03/2013   Sciatica associated with disorder of lumbar spine 08/03/2013   Aortic stenosis 09/08/2011   Hyperlipidemia 10/02/2009   CAD (coronary artery disease) 10/02/2009    Past Surgical History:  Procedure Laterality Date   CARDIAC CATHETERIZATION  2007   CARDIOVERSION     CARDIOVERSION N/A 08/04/2019   Procedure: CARDIOVERSION;  Surgeon: Fay Records, MD;  Location: [place];  Service: Cardiovascular;  Laterality: N/A;   COLONOSCOPY     INSERTION OF MESH N/A  03/11/2019   Procedure: Insertion Of Mesh;  Surgeon: Ralene Ok, MD;  Location: [place];  Service: General;  Laterality: N/A;   JOINT REPLACEMENT     KNEE ARTHROSCOPY WITH MENISCAL REPAIR Right 01/30/2011   right finger surgery     as a child   TONSILLECTOMY     TOTAL KNEE ARTHROPLASTY Left 07/28/2017   Procedure: LEFT TOTAL KNEE ARTHROPLASTY;  Surgeon: Renette Butters, MD;  Location: [place];  Service: Orthopedics;  Laterality: Left;   TOTAL SHOULDER  ARTHROPLASTY  05/29/2011   Procedure: TOTAL SHOULDER ARTHROPLASTY;  Surgeon: Metta Clines Supple;  Location: [place];  Service: Orthopedics;  Laterality: Right;   TOTAL SHOULDER ARTHROPLASTY Left 06/08/2014   DR SUPPLE   TOTAL SHOULDER ARTHROPLASTY Left 06/08/2014   Procedure: LEFT TOTAL SHOULDER ARTHROPLASTY;  Surgeon: Marin Shutter, MD;  Location: [place];  Service: Orthopedics;  Laterality: Left;   UMBILICAL HERNIA REPAIR N/A 03/11/2019   Procedure: LAPAROSCOPIC UMBILICAL HERNIA;  Surgeon: Ralene Ok, MD;  Location: [place];  Service: General;  Laterality: N/A;       Home Medications    Prior to Admission medications   Medication Sig Start Date End Date Taking? Authorizing Provider  acetaminophen (TYLENOL) 500 MG tablet Take 500 mg by mouth every 6 (six) hours as needed for moderate pain.    [provider]  allopurinol (ZYLOPRIM) 100 MG tablet TAKE 2 TABLETS EVERY DAY (DOSE INCREASE) 05/27/21   Axel Filler, MD  atorvastatin (LIPITOR) 40 MG tablet TAKE 1 TABLET EVERY DAY 08/14/21   Axel Filler, MD  carvedilol (COREG) 6.25 MG tablet TAKE 1 TABLET TWICE DAILY WITH MEALS (NEED MD APPOINTMENT) 11/19/21   Lelon Perla, MD  furosemide (LASIX) 20 MG tablet TAKE 1 TABLET EVERY DAY 11/19/21   Lelon Perla, MD  losartan (COZAAR) 50 MG tablet TAKE 1 TABLET EVERY DAY 03/20/21   Lelon Perla, MD  warfarin (COUMADIN) 5 MG tablet Take 1-2 tablets by mouth daily or as directed by Coumadin clinic 11/19/21   Lelon Perla, MD    Family History Family History  Problem Relation Age of Onset   Coronary artery disease Father 104       Died of myocardial infarction   Bradycardia Mother 60       Requiring pacemeaker placement   Coronary artery disease Brother 22       Died of myocardial infarction   Obesity Daughter    Healthy Son     Social History Social History   Tobacco Use   Smoking status: Former    Packs/day: 1.50    Years: 42.00    Total pack  years: 63.00    Types: Cigarettes    Start date: 32    Quit date: 07/14/2010    Years since quitting: 11.4   Smokeless tobacco: Never   Tobacco comments:    quit smoking about 41yr ago  Vaping Use   Vaping Use: Never used  Substance Use Topics   Alcohol use: No    Comment: no alcohol since 07   Drug use: No     Allergies   Patient has no known allergies.   Review of Systems Review of Systems Per HPI  Physical Exam Triage Vital Signs ED Triage Vitals  Enc Vitals Group     BP 12/28/21 0852 (!) 92/56     Pulse Rate 12/28/21 0852 (S) (!) 38     Resp 12/28/21 0852 18  Temp 12/28/21 0852 (!) 96.2 F (35.7 C)     Temp Source 12/28/21 0852 Temporal     SpO2 12/28/21 0852 (!) 80 %     Weight --      Height --      Head Circumference --      Peak Flow --      Pain Score 12/28/21 0854 8     Pain Loc --      Pain Edu? --      Excl. in [place]? --    No data found.  Updated Vital Signs BP (!) 90/52 (BP Location: Left Arm)   Pulse 68   Temp (!) 96.2 F (35.7 C)   Resp 18   SpO2 91%   Visual Acuity Right Eye Distance:   Left Eye Distance:   Bilateral Distance:    Right Eye Near:   Left Eye Near:    Bilateral Near:     Physical Exam Constitutional:      General: He is not in acute distress.    Appearance: Normal appearance. He is diaphoretic. He is not toxic-appearing.  HENT:     Head: Normocephalic and atraumatic.  Eyes:     Extraocular Movements: Extraocular movements intact.     Conjunctiva/sclera: Conjunctivae normal.     Pupils: Pupils are equal, round, and reactive to light.  Cardiovascular:     Rate and Rhythm: Bradycardia present. Rhythm irregular.     Pulses: No decreased pulses.     Heart sounds: Heart sounds are distant.  Pulmonary:     Effort: Pulmonary effort is normal.     Comments: tachypneic Abdominal:     General: Bowel sounds are decreased. There is distension.     Palpations: Abdomen is rigid.     Tenderness: There is generalized  abdominal tenderness.  Skin:    Coloration: Skin is pale.  Neurological:     General: No focal deficit present.     Mental Status: He is alert and oriented to person, place, and time. Mental status is at baseline.  Psychiatric:        Mood and Affect: Mood normal.        Behavior: Behavior normal.        Thought Content: Thought content normal.        Judgment: Judgment normal.      UC Treatments / Results  Labs (all labs ordered are listed, but only abnormal results are displayed) Labs Reviewed - No data to display  EKG   Radiology No results found.  Procedures Procedures (including critical care time)  Medications Ordered in UC Medications - No data to display  Initial Impression / Assessment and Plan / UC Course  I have reviewed the triage vital signs and the nursing notes.  Pertinent labs & imaging results that were available during my care of the patient were reviewed by me and considered in my medical decision making (see chart for details).     Patient is very ill appealing, pale, diaphoretic, tachypneic and having associated chest pain.  Blood pressure is also very low and heart rate is low as well.  Oxygen was 87% during initial triage.  Patient was placed on 2 L of nasal cannula and then 4 L given response of oxygen saturation.  Patient's oxygen increased to 95% with this.  Patient appeared very lethargic as well.  Due to all these factors, patient was advised that he would need to go to the hospital via EMS transport.  Patient was agreeable with plan and left via EMS for further evaluation and management. Final Clinical Impressions(s) / UC Diagnoses   Final diagnoses:  Other chest pain  Abdominal pain, unspecified abdominal location  Black stool  Shortness of breath     Discharge Instructions      Patient sent to hospital via EMS.     ED Prescriptions   None    PDMP not reviewed this encounter.   Teodora Medici, [place] 12/28/21 [phone]

## 2021-12-28 NOTE — ED Notes (Signed)
US at bedside

## 2021-12-29 ENCOUNTER — Inpatient Hospital Stay (HOSPITAL_COMMUNITY): Payer: Medicare HMO

## 2021-12-29 DIAGNOSIS — K810 Acute cholecystitis: Secondary | ICD-10-CM | POA: Diagnosis not present

## 2021-12-29 DIAGNOSIS — I4891 Unspecified atrial fibrillation: Secondary | ICD-10-CM

## 2021-12-29 DIAGNOSIS — A419 Sepsis, unspecified organism: Secondary | ICD-10-CM | POA: Diagnosis not present

## 2021-12-29 DIAGNOSIS — R6521 Severe sepsis with septic shock: Secondary | ICD-10-CM | POA: Diagnosis not present

## 2021-12-29 HISTORY — PX: IR PERC CHOLECYSTOSTOMY: IMG2326

## 2021-12-29 LAB — CBC
HCT: 31.7 % — ABNORMAL LOW (ref 39.0–52.0)
Hemoglobin: 11.6 g/dL — ABNORMAL LOW (ref 13.0–17.0)
MCH: 33.4 pg (ref 26.0–34.0)
MCHC: 36.6 g/dL — ABNORMAL HIGH (ref 30.0–36.0)
MCV: 91.4 fL (ref 80.0–100.0)
Platelets: 132 10*3/uL — ABNORMAL LOW (ref 150–400)
RBC: 3.47 MIL/uL — ABNORMAL LOW (ref 4.22–5.81)
RDW: 13.6 % (ref 11.5–15.5)
WBC: 21.3 10*3/uL — ABNORMAL HIGH (ref 4.0–10.5)
nRBC: 0 % (ref 0.0–0.2)

## 2021-12-29 LAB — POCT I-STAT 7, (LYTES, BLD GAS, ICA,H+H)
Acid-base deficit: 3 mmol/L — ABNORMAL HIGH (ref 0.0–2.0)
Acid-base deficit: 2 mmol/L (ref 0.0–2.0)
Bicarbonate: 23.1 mmol/L (ref 20.0–28.0)
Bicarbonate: 22.9 mmol/L (ref 20.0–28.0)
Calcium, Ion: 1.1 mmol/L — ABNORMAL LOW (ref 1.15–1.40)
Calcium, Ion: 1.1 mmol/L — ABNORMAL LOW (ref 1.15–1.40)
HCT: 34 % — ABNORMAL LOW (ref 39.0–52.0)
HCT: 28 % — ABNORMAL LOW (ref 39.0–52.0)
Hemoglobin: 11.6 g/dL — ABNORMAL LOW (ref 13.0–17.0)
Hemoglobin: 9.5 g/dL — ABNORMAL LOW (ref 13.0–17.0)
O2 Saturation: 86 %
O2 Saturation: 100 %
Patient temperature: 98.6
Potassium: 4 mmol/L (ref 3.5–5.1)
Potassium: 3.6 mmol/L (ref 3.5–5.1)
Sodium: 136 mmol/L (ref 135–145)
Sodium: 137 mmol/L (ref 135–145)
TCO2: 24 mmol/L (ref 22–32)
TCO2: 24 mmol/L (ref 22–32)
pCO2 arterial: 45.6 mmHg (ref 32–48)
pCO2 arterial: 40.7 mmHg (ref 32–48)
pH, Arterial: 7.313 — ABNORMAL LOW (ref 7.35–7.45)
pH, Arterial: 7.358 (ref 7.35–7.45)
pO2, Arterial: 57 mmHg — ABNORMAL LOW (ref 83–108)
pO2, Arterial: 200 mmHg — ABNORMAL HIGH (ref 83–108)

## 2021-12-29 LAB — BPAM FFP
Blood Product Expiration Date: 202307062359
Blood Product Expiration Date: 202307062359
Blood Product Expiration Date: 202307062359
Blood Product Expiration Date: 202307062359
ISSUE DATE / TIME: 202307011355
ISSUE DATE / TIME: 202307011355
ISSUE DATE / TIME: 202307011355
ISSUE DATE / TIME: 202307012115
Unit Type and Rh: 7300
Unit Type and Rh: 7300
Unit Type and Rh: 7300
Unit Type and Rh: 7300

## 2021-12-29 LAB — BASIC METABOLIC PANEL
Anion gap: 14 (ref 5–15)
BUN: 35 mg/dL — ABNORMAL HIGH (ref 8–23)
CO2: 20 mmol/L — ABNORMAL LOW (ref 22–32)
Calcium: 8.1 mg/dL — ABNORMAL LOW (ref 8.9–10.3)
Chloride: 103 mmol/L (ref 98–111)
Creatinine, Ser: 2.3 mg/dL — ABNORMAL HIGH (ref 0.61–1.24)
GFR, Estimated: 29 mL/min — ABNORMAL LOW (ref >60)
Glucose, Bld: 88 mg/dL (ref 70–99)
Potassium: 3.1 mmol/L — ABNORMAL LOW (ref 3.5–5.1)
Sodium: 137 mmol/L (ref 135–145)

## 2021-12-29 LAB — PREPARE FRESH FROZEN PLASMA
Unit division: 0
Unit division: 0

## 2021-12-29 LAB — ECHOCARDIOGRAM COMPLETE
AR max vel: 1.05 cm2
AV Area VTI: 1.11 cm2
AV Area mean vel: 1 cm2
AV Mean grad: 17.3 mmHg
AV Peak grad: 30.6 mmHg
Ao pk vel: 2.77 m/s
Height: 66 in
S' Lateral: 2.7 cm
Weight: 3061.75 oz

## 2021-12-29 LAB — GLUCOSE, CAPILLARY
Glucose-Capillary: 91 mg/dL (ref 70–99)
Glucose-Capillary: 90 mg/dL (ref 70–99)
Glucose-Capillary: 84 mg/dL (ref 70–99)
Glucose-Capillary: 105 mg/dL — ABNORMAL HIGH (ref 70–99)
Glucose-Capillary: 135 mg/dL — ABNORMAL HIGH (ref 70–99)
Glucose-Capillary: 144 mg/dL — ABNORMAL HIGH (ref 70–99)

## 2021-12-29 LAB — PROTIME-INR
INR: 1.7 — ABNORMAL HIGH (ref 0.8–1.2)
Prothrombin Time: 19.7 seconds — ABNORMAL HIGH (ref 11.4–15.2)

## 2021-12-29 LAB — MAGNESIUM: Magnesium: 1.3 mg/dL — ABNORMAL LOW (ref 1.7–2.4)

## 2021-12-29 LAB — PHOSPHORUS: Phosphorus: 4.8 mg/dL — ABNORMAL HIGH (ref 2.5–4.6)

## 2021-12-29 MED ORDER — FENTANYL CITRATE (PF) 100 MCG/2ML IJ SOLN
100.0000 ug | Freq: Once | INTRAMUSCULAR | Status: AC
  Administered 2021-12-29: 100 ug via INTRAVENOUS

## 2021-12-29 MED ORDER — INSULIN ASPART 100 UNIT/ML IJ SOLN
0.0000 [IU] | INTRAMUSCULAR | Status: DC
  Administered 2021-12-29 – 2021-12-30 (×6): 1 [IU] via SUBCUTANEOUS
  Administered 2021-12-30: 2 [IU] via SUBCUTANEOUS
  Administered 2021-12-31: 1 [IU] via SUBCUTANEOUS
  Administered 2021-12-31: 2 [IU] via SUBCUTANEOUS
  Administered 2021-12-31: 1 [IU] via SUBCUTANEOUS
  Administered 2021-12-31: 2 [IU] via SUBCUTANEOUS
  Administered 2021-12-31 (×2): 1 [IU] via SUBCUTANEOUS
  Administered 2022-01-01: 2 [IU] via SUBCUTANEOUS
  Administered 2022-01-01: 1 [IU] via SUBCUTANEOUS
  Administered 2022-01-01 – 2022-01-02 (×5): 2 [IU] via SUBCUTANEOUS
  Administered 2022-01-02: 3 [IU] via SUBCUTANEOUS

## 2021-12-29 MED ORDER — SODIUM CHLORIDE 0.9% FLUSH
5.0000 mL | Freq: Three times a day (TID) | INTRAVENOUS | Status: DC
  Administered 2021-12-29 – 2022-01-07 (×26): 5 mL

## 2021-12-29 MED ORDER — FENTANYL CITRATE (PF) 100 MCG/2ML IJ SOLN
INTRAMUSCULAR | Status: AC
  Filled 2021-12-29: qty 2

## 2021-12-29 MED ORDER — MIDAZOLAM HCL 2 MG/2ML IJ SOLN
INTRAMUSCULAR | Status: AC | PRN
  Administered 2021-12-29: .5 mg via INTRAVENOUS

## 2021-12-29 MED ORDER — FENTANYL 2500MCG IN NS 250ML (10MCG/ML) PREMIX INFUSION
0.0000 ug/h | INTRAVENOUS | Status: DC
  Administered 2021-12-29: 50 ug/h via INTRAVENOUS
  Administered 2021-12-30: 100 ug/h via INTRAVENOUS
  Administered 2021-12-31: 150 ug/h via INTRAVENOUS
  Administered 2022-01-01 (×2): 175 ug/h via INTRAVENOUS
  Administered 2022-01-02 (×2): 200 ug/h via INTRAVENOUS
  Administered 2022-01-03: 150 ug/h via INTRAVENOUS
  Administered 2022-01-03: 100 ug/h via INTRAVENOUS
  Administered 2022-01-04: 200 ug/h via INTRAVENOUS
  Administered 2022-01-04 (×2): 300 ug/h via INTRAVENOUS
  Administered 2022-01-05: 400 ug/h via INTRAVENOUS
  Administered 2022-01-05 (×2): 375 ug/h via INTRAVENOUS
  Administered 2022-01-05 – 2022-01-06 (×2): 400 ug/h via INTRAVENOUS
  Administered 2022-01-06: 200 ug/h via INTRAVENOUS
  Administered 2022-01-07: 350 ug/h via INTRAVENOUS
  Administered 2022-01-07: 300 ug/h via INTRAVENOUS
  Administered 2022-01-07: 375 ug/h via INTRAVENOUS
  Administered 2022-01-08: 350 ug/h via INTRAVENOUS
  Administered 2022-01-08: 50 ug/h via INTRAVENOUS
  Filled 2021-12-29 (×23): qty 250

## 2021-12-29 MED ORDER — KETAMINE HCL 50 MG/5ML IJ SOSY
PREFILLED_SYRINGE | INTRAMUSCULAR | Status: AC
  Filled 2021-12-29: qty 5

## 2021-12-29 MED ORDER — FENTANYL CITRATE (PF) 100 MCG/2ML IJ SOLN
50.0000 ug | Freq: Once | INTRAMUSCULAR | Status: DC
  Filled 2021-12-29: qty 2

## 2021-12-29 MED ORDER — FENTANYL BOLUS VIA INFUSION
50.0000 ug | INTRAVENOUS | Status: DC | PRN
  Administered 2021-12-29: 25 ug via INTRAVENOUS
  Administered 2021-12-30 (×2): 100 ug via INTRAVENOUS
  Administered 2021-12-30: 50 ug via INTRAVENOUS
  Administered 2021-12-30: 100 ug via INTRAVENOUS
  Administered 2021-12-30: 50 ug via INTRAVENOUS
  Administered 2021-12-30: 100 ug via INTRAVENOUS
  Administered 2021-12-31 (×2): 50 ug via INTRAVENOUS
  Administered 2021-12-31: 100 ug via INTRAVENOUS
  Administered 2021-12-31: 50 ug via INTRAVENOUS
  Administered 2021-12-31 (×2): 100 ug via INTRAVENOUS

## 2021-12-29 MED ORDER — FUROSEMIDE 10 MG/ML IJ SOLN
60.0000 mg | Freq: Three times a day (TID) | INTRAMUSCULAR | Status: AC
  Administered 2021-12-29 (×2): 60 mg via INTRAVENOUS
  Filled 2021-12-29 (×2): qty 6

## 2021-12-29 MED ORDER — ETOMIDATE 2 MG/ML IV SOLN
INTRAVENOUS | Status: AC
  Administered 2021-12-29: 20 mg
  Filled 2021-12-29: qty 20

## 2021-12-29 MED ORDER — LACTATED RINGERS IV BOLUS
1000.0000 mL | Freq: Once | INTRAVENOUS | Status: AC
  Administered 2021-12-29: 1000 mL via INTRAVENOUS

## 2021-12-29 MED ORDER — ORAL CARE MOUTH RINSE: 15.0000 mL | OROMUCOSAL | Status: DC | PRN

## 2021-12-29 MED ORDER — MIDAZOLAM HCL 2 MG/2ML IJ SOLN
INTRAMUSCULAR | Status: AC
  Filled 2021-12-29: qty 2

## 2021-12-29 MED ORDER — CHLORHEXIDINE GLUCONATE CLOTH 2 % EX PADS
6.0000 | MEDICATED_PAD | Freq: Every day | CUTANEOUS | Status: DC
  Administered 2021-12-29 – 2022-01-20 (×26): 6 via TOPICAL

## 2021-12-29 MED ORDER — ALBUMIN HUMAN 25 % IV SOLN
25.0000 g | Freq: Four times a day (QID) | INTRAVENOUS | Status: AC
  Administered 2021-12-29 – 2021-12-30 (×4): 25 g via INTRAVENOUS
  Filled 2021-12-29 (×4): qty 100

## 2021-12-29 MED ORDER — POTASSIUM CHLORIDE CRYS ER 20 MEQ PO TBCR
40.0000 meq | EXTENDED_RELEASE_TABLET | Freq: Two times a day (BID) | ORAL | Status: DC
  Administered 2021-12-29: 40 meq via ORAL
  Filled 2021-12-29: qty 2

## 2021-12-29 MED ORDER — PANTOPRAZOLE SODIUM 40 MG IV SOLR
40.0000 mg | Freq: Every day | INTRAVENOUS | Status: DC
  Administered 2021-12-29 – 2022-01-01 (×4): 40 mg via INTRAVENOUS
  Filled 2021-12-29 (×4): qty 10

## 2021-12-29 MED ORDER — MAGNESIUM SULFATE 2 GM/50ML IV SOLN
2.0000 g | Freq: Once | INTRAVENOUS | Status: AC
  Administered 2021-12-29: 2 g via INTRAVENOUS
  Filled 2021-12-29: qty 50

## 2021-12-29 MED ORDER — POTASSIUM CHLORIDE 10 MEQ/100ML IV SOLN
10.0000 meq | INTRAVENOUS | Status: AC
  Administered 2021-12-29 (×5): 10 meq via INTRAVENOUS
  Filled 2021-12-29 (×5): qty 100

## 2021-12-29 MED ORDER — SUCCINYLCHOLINE CHLORIDE 200 MG/10ML IV SOSY
PREFILLED_SYRINGE | INTRAVENOUS | Status: AC
  Filled 2021-12-29: qty 10

## 2021-12-29 MED ORDER — LIDOCAINE HCL 1 % IJ SOLN
INTRAMUSCULAR | Status: AC
  Filled 2021-12-29: qty 20

## 2021-12-29 MED ORDER — DOCUSATE SODIUM 50 MG/5ML PO LIQD
100.0000 mg | Freq: Two times a day (BID) | ORAL | Status: DC
  Administered 2021-12-29 – 2021-12-30 (×2): 100 mg
  Filled 2021-12-29 (×2): qty 10

## 2021-12-29 MED ORDER — MAGNESIUM SULFATE 2 GM/50ML IV SOLN
2.0000 g | Freq: Once | INTRAVENOUS | Status: AC
Start: 2021-12-29 — End: 2021-12-29
  Administered 2021-12-29: 2 g via INTRAVENOUS
  Filled 2021-12-29: qty 50

## 2021-12-29 MED ORDER — FAMOTIDINE 20 MG PO TABS
20.0000 mg | ORAL_TABLET | Freq: Every day | ORAL | Status: DC
  Administered 2021-12-30: 20 mg via ORAL
  Filled 2021-12-29: qty 1

## 2021-12-29 MED ORDER — POLYETHYLENE GLYCOL 3350 17 G PO PACK
17.0000 g | PACK | Freq: Every day | ORAL | Status: DC
  Administered 2021-12-29 – 2021-12-30 (×2): 17 g
  Filled 2021-12-29 (×2): qty 1

## 2021-12-29 MED ORDER — ORAL CARE MOUTH RINSE
15.0000 mL | OROMUCOSAL | Status: DC
  Administered 2021-12-29 – 2022-01-09 (×125): 15 mL via OROMUCOSAL

## 2021-12-29 MED ORDER — POTASSIUM CHLORIDE 20 MEQ PO PACK
40.0000 meq | PACK | Freq: Two times a day (BID) | ORAL | Status: DC
  Administered 2021-12-29: 40 meq
  Filled 2021-12-29: qty 2

## 2021-12-29 MED ORDER — ROCURONIUM BROMIDE 10 MG/ML (PF) SYRINGE
PREFILLED_SYRINGE | INTRAVENOUS | Status: AC
  Administered 2021-12-29: 70 mg
  Filled 2021-12-29: qty 10

## 2021-12-29 MED ORDER — FENTANYL CITRATE (PF) 100 MCG/2ML IJ SOLN
INTRAMUSCULAR | Status: AC | PRN
  Administered 2021-12-29: 25 ug via INTRAVENOUS

## 2021-12-29 MED ORDER — PROPOFOL 1000 MG/100ML IV EMUL
0.0000 ug/kg/min | INTRAVENOUS | Status: DC
  Administered 2021-12-29: 5 ug/kg/min via INTRAVENOUS
  Administered 2021-12-30: 15 ug/kg/min via INTRAVENOUS
  Administered 2021-12-30: 20 ug/kg/min via INTRAVENOUS
  Administered 2021-12-31: 10 ug/kg/min via INTRAVENOUS
  Administered 2021-12-31: 25 ug/kg/min via INTRAVENOUS
  Administered 2021-12-31: 10 ug/kg/min via INTRAVENOUS
  Administered 2022-01-01: 18 ug/kg/min via INTRAVENOUS
  Administered 2022-01-01: 20 ug/kg/min via INTRAVENOUS
  Administered 2022-01-01: 25 ug/kg/min via INTRAVENOUS
  Administered 2022-01-02: 30 ug/kg/min via INTRAVENOUS
  Filled 2021-12-29 (×10): qty 100

## 2021-12-29 NOTE — Progress Notes (Signed)
[facility] Progress Note Patient Name: [name] DOB: September 20, 1945 MRN: [id number]   Date of Service  12/29/2021  HPI/Events of Note  Notified of hypokalemia, and hypomagnesemia.    eICU Interventions  Placed IV KCL and magnesium sulfate repletion orders.  Would be careful as creatinine elevated.         Jaelyne Deeg M DELA CRUZ 12/29/2021, 5:25 AM

## 2021-12-29 NOTE — Procedures (Signed)
Interventional Radiology Procedure Note  Procedure: Placement of a 79F transhepatic percutaneous cholecystostomy tube.   Complications: None  Estimated Blood Loss: None  Recommendations: - Drain to bag    Signed,  Criselda Peaches, MD

## 2021-12-29 NOTE — Procedures (Signed)
Central Venous Catheter Insertion Procedure Note  [name]  [id number]  1946/01/20  Date:12/29/21  Time:9:02 PM   Provider Performing:Marny Smethers   Procedure: Insertion of Non-tunneled Central Venous [phone]) with US guidance (30092)   Indication(s) Medication administration  Consent Risks of the procedure as well as the alternatives and risks of each were explained to the patient and/or caregiver.  Consent for the procedure was obtained and is signed in the bedside chart  Anesthesia Topical only with 1% lidocaine   Timeout Verified patient identification, verified procedure, site/side was marked, verified correct patient position, special equipment/implants available, medications/allergies/relevant history reviewed, required imaging and test results available.  Sterile Technique Maximal sterile technique including full sterile barrier drape, hand hygiene, sterile gown, sterile gloves, mask, hair covering, sterile ultrasound probe cover (if used).  Procedure Description Area of catheter insertion was cleaned with chlorhexidine and draped in sterile fashion.  With real-time ultrasound guidance a central venous catheter was placed into the left internal jugular vein. Nonpulsatile blood flow and easy flushing noted in all ports.  The catheter was sutured in place and sterile dressing applied.  Complications/Tolerance None; patient tolerated the procedure well. Chest X-ray is ordered to verify placement for internal jugular or subclavian cannulation.   Chest x-ray is not ordered for femoral cannulation.  EBL Minimal  Specimen(s) None

## 2021-12-29 NOTE — Progress Notes (Signed)
Progressive confusion and hypoxemia through day. Gurgling respirations.  Intubated, will check CXR.  Will update family  34 min cc time on above not including procedures.

## 2021-12-29 NOTE — Procedures (Signed)
Intubation Procedure Note  [name]  [id number]  1946/04/27  Date:12/29/21  Time:6:50 PM   Provider Performing:Kwan Shellhammer C Tamala Julian    Procedure: Intubation (02111)  Indication(s) Respiratory Failure  Consent Unable to obtain consent due to emergent nature of procedure.   Anesthesia Etomidate, Versed, Fentanyl, and Rocuronium   Time Out Verified patient identification, verified procedure, site/side was marked, verified correct patient position, special equipment/implants available, medications/allergies/relevant history reviewed, required imaging and test results available.   Sterile Technique Usual hand hygeine, masks, and gloves were used   Procedure Description Patient positioned in bed supine.  Sedation given as noted above.  Patient was intubated with endotracheal tube using Glidescope.  View was Grade 1 full glottis .  Number of attempts was 1.  Colorimetric CO2 detector was consistent with tracheal placement.   Complications/Tolerance None; patient tolerated the procedure well. Chest X-ray is ordered to verify placement.   EBL Minimal   Specimen(s) None

## 2021-12-29 NOTE — Progress Notes (Signed)
  Echocardiogram 2D Echocardiogram has been performed.  [name] 12/29/2021, 2:00 PM

## 2021-12-29 NOTE — Progress Notes (Signed)
Patient having worsening hypoxia throughout the day, even with increasing oxygen therapy up to a NRB. Patient also increasingly confused. ABG obtained. Dr. Tamala Julian assessed patient at bedside and decision made to intubate patient. Dr. Tamala Julian to update family.

## 2021-12-29 NOTE — H&P (Signed)
NAME:  [name], MRN:  [id number], DOB:  [birth year], LOS: 1 ADMISSION DATE:  12/28/2021, CONSULTATION DATE: 12/29/2018. REFERRING MD: ER physician, CHIEF COMPLAINT: Sepsis  History of Present Illness:  76 year old male who presents to the hospital several days general malaise, fever, abdominal pain more focused in the right side.  He is notable for admission for supratherapeutic INR which he takes Coumadin for atrial fibrillation.  CT scan of the abdomen demonstrated enlarged gallbladder consistent with gallbladder.Question thrombus versus unopacified blood within LEFT atrial appendage; can assess by echocardiography.Cholelithiasis with calculi, wall thickening and surrounding inflammatory changes highly suspicious for acute cholecystitis    Pertinent  Medical History   Past Medical History:  Diagnosis Date   Allergic rhinitis 08/08/2013   takes Loratadine daily    Aortic stenosis 09/08/2011   With mild aortic regurgitation, mean gradient 13 mmHg    Cataract    right but immature   Complication of anesthesia    stopped breathing - during shoulder surgery 2009-2010   Coronary artery disease 10/02/2009   Cardiac cath (October 2007): 20% left main, 60% mid LAD, 60-70% distal LAD, 30-40% first diagonal, 30% circumflex, 40% OM, 30-40% RCA    Degenerative joint disease of shoulder 08/03/2013   Bilateral, s/p right total shoulder arthroplasty 05/29/2011 and left shoulder arthroplasty 95/02/3266   Diastolic dysfunction 06/02/5808   Echo (04/04/2016): Grade I   Diverticulosis 10/07/2013   Seen on colonoscopy in 2007    Erectile dysfunction 05/07/2009   Essential hypertension 08/03/2013   had been on Lisinopril but stopped per MD   First degree AV block 03/14/2016   Gastric ulcer 10/07/2013   Seen on EGD 04/10/2006    Hemorrhoids 10/07/2013   Hyperlipidemia 10/02/2009   Paroxysmal atrial fibrillation ([place]) 07/14/2006   One episode, provoked by alcohol use disorder, briefly anticoagulated with  warfarin, then switched to aspirin alone   Sciatica associated with disorder of lumbar spine 08/03/2013   Anterolisthesis with right L 4-5 nerve root compression.  Treated with epidural injections and Physical Therapy.    Seborrheic keratosis 10/07/2013   Tubular adenoma of colon      Significant Hospital Events: Including procedures, antibiotic start and stop dates in addition to other pertinent events   12/28/2021 admitted for  Interim History / Subjective:  For IR this AM. O2 needs increasing. He is thirsty.  Objective   Blood pressure (!) 114/98, pulse (!) 101, temperature 97.9 F (36.6 C), temperature source Axillary, resp. rate (!) 24, height '5\' 6"'$  (1.676 m), weight 86.8 kg, SpO2 91 %.        Intake/Output Summary (Last 24 hours) at 12/29/2021 1607 Last data filed at 12/29/2021 1500 Gross per 24 hour  Intake 3345.36 ml  Output 1440 ml  Net 1905.36 ml    Filed Weights   12/28/21 0936 12/28/21 1215 12/29/21 0500  Weight: 84.4 kg 86.3 kg 86.8 kg    Examination: No distress Some focal upper airway gurgling c/w retained pharyngeal secretions Otherwise crackles at bases minimal Abdomen mildly TTP Ext warm to touch  K low being repleted Mg low being repleted WBC improved  Resolved Hospital Problem list     Assessment & Plan:  Sepsis due to cholecystitis Drain placement today, appreciate IR help Continue abx, follow fever and WBC curve Okay to eat from my standpoint after drain  Acute renal insufficiency, Acute hypoxemic resp failure Lab Results  Component Value Date   CREATININE 2.30 (H) 12/29/2021   CREATININE 2.90 (H) 12/28/2021  CREATININE 3.08 (H) 12/28/2021   CREATININE 1.01 11/13/2014   CREATININE 1.03 10/13/2014   CREATININE 1.14 08/22/2013  Seems to be getting wet, hold further fluids, check CXR, start CPT  Supratherapeutic INR Lab Results  Component Value Date   INR 1.7 (H) 12/29/2021   INR 2.8 (H) 12/28/2021   INR 7.3 (HH) 12/28/2021  Hold  Coumadin pending drain placement  Chronic atrial fibrillation on Coumadin INR now supratherapeutic holding Coumadin  Question thrombus versus unopacified blood within LEFT atrial appendage;  F/u 2d echo, doubt he has one with supratherapeutic INR   Best Practice (right click and "Reselect all SmartList Selections" daily)   Diet/type: NPO until procedure DVT prophylaxis: on hold for procedure GI prophylaxis: PPI Lines: N/A Foley:  N/A Code Status:  full code Last date of multidisciplinary goals of care discussion [to be determined]  After discussion with nursing/RT, keep 1 more day pending improvement/stability in resp status Erskine Emery MD PCCM

## 2021-12-30 ENCOUNTER — Inpatient Hospital Stay (HOSPITAL_COMMUNITY): Payer: Medicare HMO

## 2021-12-30 DIAGNOSIS — K810 Acute cholecystitis: Secondary | ICD-10-CM | POA: Diagnosis not present

## 2021-12-30 LAB — GLUCOSE, CAPILLARY
Glucose-Capillary: 131 mg/dL — ABNORMAL HIGH (ref 70–99)
Glucose-Capillary: 104 mg/dL — ABNORMAL HIGH (ref 70–99)
Glucose-Capillary: 129 mg/dL — ABNORMAL HIGH (ref 70–99)
Glucose-Capillary: 151 mg/dL — ABNORMAL HIGH (ref 70–99)
Glucose-Capillary: 145 mg/dL — ABNORMAL HIGH (ref 70–99)
Glucose-Capillary: 129 mg/dL — ABNORMAL HIGH (ref 70–99)

## 2021-12-30 LAB — TRIGLYCERIDES: Triglycerides: 310 mg/dL — ABNORMAL HIGH (ref <150)

## 2021-12-30 LAB — COOXEMETRY PANEL
Carboxyhemoglobin: 1.1 % (ref 0.5–1.5)
Methemoglobin: <0.7 % (ref 0.0–1.5)
O2 Saturation: 58.5 %
Total hemoglobin: 10.3 g/dL — ABNORMAL LOW (ref 12.0–16.0)

## 2021-12-30 LAB — PHOSPHORUS: Phosphorus: 2.7 mg/dL (ref 2.5–4.6)

## 2021-12-30 LAB — BASIC METABOLIC PANEL
Anion gap: 14 (ref 5–15)
BUN: 48 mg/dL — ABNORMAL HIGH (ref 8–23)
CO2: 22 mmol/L (ref 22–32)
Calcium: 8.4 mg/dL — ABNORMAL LOW (ref 8.9–10.3)
Chloride: 101 mmol/L (ref 98–111)
Creatinine, Ser: 2.42 mg/dL — ABNORMAL HIGH (ref 0.61–1.24)
GFR, Estimated: 27 mL/min — ABNORMAL LOW (ref >60)
Glucose, Bld: 124 mg/dL — ABNORMAL HIGH (ref 70–99)
Potassium: 3.5 mmol/L (ref 3.5–5.1)
Sodium: 137 mmol/L (ref 135–145)

## 2021-12-30 LAB — CBC
HCT: 29.3 % — ABNORMAL LOW (ref 39.0–52.0)
Hemoglobin: 10.4 g/dL — ABNORMAL LOW (ref 13.0–17.0)
MCH: 33.1 pg (ref 26.0–34.0)
MCHC: 35.5 g/dL (ref 30.0–36.0)
MCV: 93.3 fL (ref 80.0–100.0)
Platelets: 105 10*3/uL — ABNORMAL LOW (ref 150–400)
RBC: 3.14 MIL/uL — ABNORMAL LOW (ref 4.22–5.81)
RDW: 14 % (ref 11.5–15.5)
WBC: 13.4 10*3/uL — ABNORMAL HIGH (ref 4.0–10.5)
nRBC: 0 % (ref 0.0–0.2)

## 2021-12-30 LAB — MAGNESIUM
Magnesium: 2.7 mg/dL — ABNORMAL HIGH (ref 1.7–2.4)
Magnesium: 2.6 mg/dL — ABNORMAL HIGH (ref 1.7–2.4)

## 2021-12-30 MED ORDER — NOREPINEPHRINE 4 MG/250ML-% IV SOLN
0.0000 ug/min | INTRAVENOUS | Status: DC
  Administered 2021-12-30: 13 ug/min via INTRAVENOUS
  Filled 2021-12-30 (×2): qty 250

## 2021-12-30 MED ORDER — DOCUSATE SODIUM 50 MG/5ML PO LIQD
100.0000 mg | Freq: Two times a day (BID) | ORAL | Status: DC | PRN
  Filled 2021-12-30: qty 10

## 2021-12-30 MED ORDER — VASOPRESSIN 20 UNITS/100 ML INFUSION FOR SHOCK
0.0300 [IU]/min | INTRAVENOUS | Status: DC
  Administered 2021-12-30 – 2022-01-02 (×7): 0.03 [IU]/min via INTRAVENOUS
  Filled 2021-12-30 (×7): qty 100

## 2021-12-30 MED ORDER — NOREPINEPHRINE 16 MG/250ML-% IV SOLN
0.0000 ug/min | INTRAVENOUS | Status: DC
  Administered 2021-12-30: 13 ug/min via INTRAVENOUS
  Administered 2021-12-31: 11 ug/min via INTRAVENOUS
  Administered 2022-01-01: 18 ug/min via INTRAVENOUS
  Filled 2021-12-30 (×3): qty 250

## 2021-12-30 MED ORDER — SODIUM CHLORIDE 0.9% FLUSH
10.0000 mL | Freq: Two times a day (BID) | INTRAVENOUS | Status: DC
  Administered 2021-12-30 – 2022-01-01 (×6): 10 mL
  Administered 2022-01-01 – 2022-01-02 (×2): 30 mL
  Administered 2022-01-03 – 2022-01-06 (×6): 10 mL
  Administered 2022-01-06: 20 mL
  Administered 2022-01-07 – 2022-01-11 (×10): 10 mL

## 2021-12-30 MED ORDER — OSMOLITE 1.5 CAL PO LIQD
1000.0000 mL | ORAL | Status: DC
  Administered 2021-12-30 – 2022-01-02 (×2): 1000 mL
  Filled 2021-12-30: qty 1000

## 2021-12-30 MED ORDER — POTASSIUM CHLORIDE 20 MEQ PO PACK
40.0000 meq | PACK | Freq: Once | ORAL | Status: AC
Start: 2021-12-30 — End: 2021-12-30
  Administered 2021-12-30: 40 meq
  Filled 2021-12-30: qty 2

## 2021-12-30 MED ORDER — SODIUM CHLORIDE 0.9% FLUSH: 10.0000 mL | INTRAVENOUS | Status: DC | PRN

## 2021-12-30 NOTE — Progress Notes (Signed)
Initial Nutrition Assessment  DOCUMENTATION CODES:   Not applicable  INTERVENTION:   Initiate trickle tube feeds via OG tube: - Osmolite 1.5 @ 20 ml/hr (480 ml/day)  Trickle tube feeding regimen provides 720 kcal, 30 grams of protein, and 366 ml of H2O (meets 38% of minimum kcal needs and 29% of minimum protein needs).   RD will monitor for ability to advance tube feeds to goal regimen: - Osmolite 1.5 @ 55 ml/hr (1320 ml/day) - ProSource TF 45 ml TID  Goal tube feeding regimen will provide 2100 kcal, 116 grams of protein, and 1006 ml of H2O.   NUTRITION DIAGNOSIS:   Inadequate oral intake related to inability to eat as evidenced by NPO status.  GOAL:   Patient will meet greater than or equal to 90% of their needs  MONITOR:   Vent status, Labs, Weight trends, TF tolerance, I & O's  REASON FOR ASSESSMENT:   Ventilator, Consult Enteral/tube feeding initiation and management (trickle tube feeds)  ASSESSMENT:   76 year old male who presented to the ED on 7/01 with abdominal pain. PMH of PAF, HLD, CAD, HTN. Pt admitted with sepsis secondary to acute cholecystitis.  07/02 - s/p IR placement of percutaneous cholecystostomy tube, later intubated  Consult received for enteral nutrition initiation and management. Discussed with CCM MD. Will do trickle tube feeds only today as abdominal x-ray yesterday showed "new gaseous dilation of loops of small bowel, likely ileus." Pt with OG tube tip and side port in stomach per abdominal x-ray yesterday.  Unable to obtain diet and weight history at this time. Reviewed weight history in chart. Weight stable over the last 6 months.  Admit weight: 86.3 kg Current weight: 87.6 kg  Patient is currently intubated on ventilator support MV: 9.9 L/min Temp (24hrs), Avg:98.7 F (37.1 C), Min:97.9 F (36.6 C), Max:99.6 F (37.6 C) BP (cuff): 100/67 MAP (cuff): 66  Drips: Propofol: 7.8 ml/hr (provides 206 kcal daily from  lipid) Fentanyl Levophed Vasopressin  Medications reviewed and include: colace, pepcid, SSI q 4 hours, IV protonix, miralax, IV abx  Labs reviewed: BUN 48, creatinine 2.42, ionized calcium 1.10, phosphorus 4.8 on 7/02, magnesium 2.7, TG 310, WBC 13.4, platelets 105 CBG's: 104-144 x 24 hours  UOP: 1050 ml x 24 hours Perc chole drain: 420 ml x 24 hours I/O's: +5.5 L since admit  NUTRITION - FOCUSED PHYSICAL EXAM:  Unable to complete at this time. RD working remotely.  Diet Order:   Diet Order     None       EDUCATION NEEDS:   No education needs have been identified at this time  Skin:  Skin Assessment: Reviewed RN Assessment  Last BM:  12/28/21  Height:   Ht Readings from Last 1 Encounters:  12/28/21 '5\' 6"'$  (1.676 m)    Weight:   Wt Readings from Last 1 Encounters:  12/30/21 87.6 kg    Ideal Body Weight:  64.5 kg  BMI:  Body mass index is 31.17 kg/m.  Estimated Nutritional Needs:   Kcal:  1900-2100  Protein:  105-125 grams  Fluid:  >/= 1.8 L    Gustavus Bryant, MS, RD, LDN Inpatient Clinical Dietitian Please see [facility] for contact information.

## 2021-12-30 NOTE — Progress Notes (Signed)
NAME:  [name], MRN:  [id number], DOB:  08-20-1945, LOS: 2 ADMISSION DATE:  12/28/2021, CONSULTATION DATE: 12/29/2018. REFERRING MD: ER physician, CHIEF COMPLAINT: Sepsis  History of Present Illness:  76 year old male who presents to the hospital several days general malaise, fever, abdominal pain more focused in the right side.  He is notable for admission for supratherapeutic INR which he takes Coumadin for atrial fibrillation.  CT scan of the abdomen demonstrated enlarged gallbladder consistent with gallbladder.Question thrombus versus unopacified blood within LEFT atrial appendage; can assess by echocardiography.Cholelithiasis with calculi, wall thickening and surrounding inflammatory changes highly suspicious for acute cholecystitis    Pertinent  Medical History   Past Medical History:  Diagnosis Date   Allergic rhinitis 08/08/2013   takes Loratadine daily    Aortic stenosis 09/08/2011   With mild aortic regurgitation, mean gradient 13 mmHg    Cataract    right but immature   Complication of anesthesia    stopped breathing - during shoulder surgery 2009-2010   Coronary artery disease 10/02/2009   Cardiac cath (October 2007): 20% left main, 60% mid LAD, 60-70% distal LAD, 30-40% first diagonal, 30% circumflex, 40% OM, 30-40% RCA    Degenerative joint disease of shoulder 08/03/2013   Bilateral, s/p right total shoulder arthroplasty 05/29/2011 and left shoulder arthroplasty 99/37/1696   Diastolic dysfunction 78/02/3809   Echo (04/04/2016): Grade I   Diverticulosis 10/07/2013   Seen on colonoscopy in 2007    Erectile dysfunction 05/07/2009   Essential hypertension 08/03/2013   had been on Lisinopril but stopped per MD   First degree AV block 03/14/2016   Gastric ulcer 10/07/2013   Seen on EGD 04/10/2006    Hemorrhoids 10/07/2013   Hyperlipidemia 10/02/2009   Paroxysmal atrial fibrillation ([place]) 07/14/2006   One episode, provoked by alcohol use disorder, briefly anticoagulated with  warfarin, then switched to aspirin alone   Sciatica associated with disorder of lumbar spine 08/03/2013   Anterolisthesis with right L 4-5 nerve root compression.  Treated with epidural injections and Physical Therapy.    Seborrheic keratosis 10/07/2013   Tubular adenoma of colon      Significant Hospital Events: Including procedures, antibiotic start and stop dates in addition to other pertinent events   12/28/2021 admitted for cholecystitis 7/2 intubated for rapid respiratory compromise  Interim History / Subjective:  Intubated, sedated. CXR wet with atelectasis on the right, possible Pna with R heart border obscured. On more pressor. Diuresed yesterday, 1L UOP. Cr slightly better. CVP 13. Venous sat 58.5% on central line.  Objective   Blood pressure 93/61, pulse 90, temperature 98.1 F (36.7 C), temperature source Axillary, resp. rate 18, height '5\' 6"'$  (1.676 m), weight 87.6 kg, SpO2 95 %.    Vent Mode: PRVC FiO2 (%):  [40 %-100 %] 40 % Set Rate:  [18 bmp] 18 bmp Vt Set:  [510 mL] 510 mL PEEP:  [5 cmH20] 5 cmH20 Plateau Pressure:  [19 cmH20-22 cmH20] 22 cmH20   Intake/Output Summary (Last 24 hours) at 12/30/2021 1751 Last data filed at 12/30/2021 0800 Gross per 24 hour  Intake 1716.75 ml  Output 1370 ml  Net 346.75 ml    Filed Weights   12/28/21 1215 12/29/21 0500 12/30/21 0500  Weight: 86.3 kg 86.8 kg 87.6 kg    Examination:   Resolved Hospital Problem list     Assessment & Plan:  Severe sepsis and septic shock due to cholecystitis Drain placement 7/2, appreciate IR help Continue abx, follow fever and WBC curve  Okay to eat from my standpoint after drain Co-ox 58.5%  would expect higher if pure septic TTE ok, volume overloaded, needs diuresis but cautious with pressor increase 7/3 after lasix 7/2  Acute renal insufficiency, Acute hypoxemic resp failure Mixed physiology from sepsis, ATN with hypotension, volume overload. Cr slightly better last 48 hrs, diuresed 7/2 with  worsened pressor requirement.   Supratherapeutic INR: Resolved with holding  Chronic atrial fibrillation on Coumadin Initial INR supratherapeutic. Holding with recent instrumentation.   Question thrombus versus unopacified blood within LEFT atrial appendage;  None seen on 2D TTE   Best Practice (right click and "Reselect all SmartList Selections" daily)   Diet/type: NPO until procedure DVT prophylaxis: on hold for procedure GI prophylaxis: PPI Lines: N/A Foley:  N/A Code Status:  full code Last date of multidisciplinary goals of care discussion [to be determined]  CRITICAL CARE Performed by: Lanier Clam   Total critical care time: 40 minutes  Critical care time was exclusive of separately billable procedures and treating other patients.  Critical care was necessary to treat or prevent imminent or life-threatening deterioration.  Critical care was time spent personally by me on the following activities: development of treatment plan with patient and/or surrogate as well as nursing, discussions with consultants, evaluation of patient's response to treatment, examination of patient, obtaining history from patient or surrogate, ordering and performing treatments and interventions, ordering and review of laboratory studies, ordering and review of radiographic studies, pulse oximetry and re-evaluation of patient's condition.  Lanier Clam, MD

## 2021-12-30 NOTE — Progress Notes (Signed)
Referring Physician(s): Reather Laurence   Supervising Physician: Jacqulynn Cadet  Patient Status:  [name] - In-pt  Chief Complaint:  Sepsis most likely due to acute cholecystitis, s/p perc chole placement with moderated sedation on 7/2 by Dr. Laurence Ferrari  Procedure occurred w/o difficult and patient was transferred to ICU in stable condition; however, he developed worsening hypoxia with confusion, which required intubation.   Subjective:  Pt laying in bed, intubated and sedated. Spouse at bedside.  Patient does not wake up with verbal stimuli.   Allergies: Patient has no known allergies.  Medications: Prior to Admission medications   Medication Sig Start Date End Date Taking? Authorizing Provider  allopurinol (ZYLOPRIM) 100 MG tablet TAKE 2 TABLETS EVERY DAY (DOSE INCREASE) Patient taking differently: Take 200 mg by mouth daily. 05/27/21  Yes Axel Filler, MD  atorvastatin (LIPITOR) 40 MG tablet TAKE 1 TABLET EVERY DAY Patient taking differently: Take 40 mg by mouth daily. 08/14/21  Yes Axel Filler, MD  carvedilol (COREG) 6.25 MG tablet TAKE 1 TABLET TWICE DAILY WITH MEALS (NEED MD APPOINTMENT) Patient taking differently: Take 6.25 mg by mouth 2 (two) times daily with a meal. 11/19/21  Yes Crenshaw, Denice Bors, MD  furosemide (LASIX) 20 MG tablet TAKE 1 TABLET EVERY DAY Patient taking differently: Take 20 mg by mouth daily. 11/19/21  Yes Lelon Perla, MD  losartan (COZAAR) 50 MG tablet TAKE 1 TABLET EVERY DAY Patient taking differently: Take 50 mg by mouth daily. 03/20/21  Yes Lelon Perla, MD  naproxen sodium (ALEVE) 220 MG tablet Take 440 mg by mouth daily as needed (pain).   Yes [provider]  warfarin (COUMADIN) 5 MG tablet Take 1-2 tablets by mouth daily or as directed by Coumadin clinic Patient taking differently: Take 2.5-5 mg by mouth daily. 2.5 mg daily on Sunday, Tuesday, Thursday, Saturday 5 mg daily on Monday, Wednesday, Friday  11/19/21  Yes Lelon Perla, MD     Vital Signs: BP 93/61   Pulse 90   Temp 98.1 F (36.7 C) (Axillary)   Resp 18   Ht '5\' 6"'$  (1.676 m)   Wt 193 lb 2 oz (87.6 kg)   SpO2 95%   BMI 31.17 kg/m   Physical Exam Vitals reviewed.  Constitutional:      Comments: Sedated with propofol   HENT:     Head: Normocephalic.     Mouth/Throat:     Comments: Intubated  Abdominal:     General: Abdomen is flat.     Palpations: Abdomen is soft.  Skin:    General: Skin is warm and dry.     Coloration: Skin is not jaundiced.     Comments: Positive RUQ drain to gravity bag. Site is unremarkable with no erythema, edema, tenderness, bleeding or drainage. Suture and stat lock in place. Dressing is clean, dry, and intact. 50 ml of  bile colored fluid noted in the bag. Drain aspirates and flushes well.       Imaging: DG Chest Port 1 View  Result Date: 12/30/2021 CLINICAL DATA:  Endotracheal tube EXAM: PORTABLE CHEST 1 VIEW COMPARISON:  Radiograph 12/29/2021 FINDINGS: Endotracheal tube tip overlies the distal trachea approximately 1.4 cm above the carina. Left neck catheter tip overlies the superior cavoatrial junction. Orogastric tube passes below the diaphragm, tip excluded by collimation. Unchanged enlarged cardiomediastinal silhouette. Low lung volumes. Increased right basilar atelectasis. Stable trace pleural effusions. No pneumothorax. Unchanged bilateral shoulder arthroplasties. IMPRESSION: Endotracheal tube tip overlies the distal trachea  approximately 1.4 cm by the carina, would consider retraction by 1.0 cm. Increasing right basilar atelectasis. Stable probable trace pleural effusions. Electronically Signed   By: Maurine Simmering M.D.   On: 12/30/2021 08:07   DG CHEST PORT 1 VIEW  Result Date: 12/29/2021 CLINICAL DATA:  Central line placement. EXAM: PORTABLE CHEST 1 VIEW COMPARISON:  Radiograph 45 minutes ago, CT yesterday FINDINGS: New left internal jugular central venous catheter tip overlies the  atrial caval junction. No pneumothorax. Endotracheal tube tip just below the clavicular heads. Tip and side port of the enteric tube below the diaphragm in the stomach. Persistent low lung volumes. Stable heart size and mediastinal contours, bibasilar atelectasis. Possible developing pleural effusions. Cholecystostomy tube in the right upper quadrant. Dense aortic atherosclerosis. IMPRESSION: 1. New left internal jugular central venous catheter with tip overlying the atrial caval junction. No pneumothorax. 2. Persistent low lung volumes with bibasilar atelectasis and possible developing pleural effusions. Electronically Signed   By: Keith Rake M.D.   On: 12/29/2021 19:57   DG CHEST PORT 1 VIEW  Result Date: 12/29/2021 CLINICAL DATA:  [id number]; enteric tube placement EXAM: PORTABLE CHEST 1 VIEW; portable abdomen one view COMPARISON:  CT dated December 28, 2021 FINDINGS: The cardiomediastinal silhouette is unchanged in contour.ETT tip terminates 2.5 Cm above the carina. Small LEFT pleural effusion. Bibasilar opacities likely atelectasis. No pneumothorax. Atherosclerotic calcifications throughout the aorta. Enteric tube tip and side port project over the distal stomach. Percutaneous cholecystectomy tube projects over the RIGHT abdomen. New gaseous dilation of multiple loops of small bowel, likely ileus given recent surgical intervention. Status post bilateral shoulder arthroplasty. IMPRESSION: 1. Enteric tube tip and side port project over the distal stomach. 2. ETT tip terminates between the thoracic inlet and the carina. 3. New gaseous dilation of loops of small bowel. This likely reflect ileus given recent percutaneous cholecystectomy tube placement. If persistent concern for obstruction, recommend dedicated CT or serial KUBs. 4. Small LEFT pleural effusion. Electronically Signed   By: Valentino Saxon M.D.   On: 12/29/2021 19:10   DG Abd Portable 1V  Result Date: 12/29/2021 CLINICAL DATA:  [id number]  [id number]; enteric tube placement EXAM: PORTABLE CHEST 1 VIEW; portable abdomen one view COMPARISON:  CT dated December 28, 2021 FINDINGS: The cardiomediastinal silhouette is unchanged in contour.ETT tip terminates 2.5 Cm above the carina. Small LEFT pleural effusion. Bibasilar opacities likely atelectasis. No pneumothorax. Atherosclerotic calcifications throughout the aorta. Enteric tube tip and side port project over the distal stomach. Percutaneous cholecystectomy tube projects over the RIGHT abdomen. New gaseous dilation of multiple loops of small bowel, likely ileus given recent surgical intervention. Status post bilateral shoulder arthroplasty. IMPRESSION: 1. Enteric tube tip and side port project over the distal stomach. 2. ETT tip terminates between the thoracic inlet and the carina. 3. New gaseous dilation of loops of small bowel. This likely reflect ileus given recent percutaneous cholecystectomy tube placement. If persistent concern for obstruction, recommend dedicated CT or serial KUBs. 4. Small LEFT pleural effusion. Electronically Signed   By: Valentino Saxon M.D.   On: 12/29/2021 19:10   IR Perc Cholecystostomy  Result Date: 12/29/2021 INDICATION: 76 year old male with acute cholecystitis and active sepsis. He is currently not a surgical candidate and presents for percutaneous cholecystostomy tube placement. Incidentally, the interventional radiology suite is currently down. Therefore, we will proceed using ultrasound guidance only out of necessity. EXAM: CHOLECYSTOSTOMY MEDICATIONS: Patient currently receiving intravenous Zosyn. No additional antibiotic prophylaxis employed. ANESTHESIA/SEDATION: Moderate (conscious) sedation was employed during  this procedure. A total of Versed 0.5 mg and Fentanyl 25 mcg was administered intravenously. Moderate Sedation Time: 11 minutes. The patient's level of consciousness and vital signs were monitored continuously by radiology nursing throughout the procedure under  my direct supervision. FLUOROSCOPY TIME:  None. COMPLICATIONS: None immediate. PROCEDURE: Informed written consent was obtained from the patient after a thorough discussion of the procedural risks, benefits and alternatives. All questions were addressed. Maximal Sterile Barrier Technique was utilized including caps, mask, sterile gowns, sterile gloves, sterile drape, hand hygiene and skin antiseptic. A timeout was performed prior to the initiation of the procedure. Ultrasound was used to interrogate the right upper quadrant. The gallbladder is distended. There is a small amount of perihepatic ascites. A suitable skin entry site was selected and marked. Local anesthesia was attained by infiltration with 1% lidocaine. A small dermatotomy was made. Under real-time ultrasound guidance, a 21 gauge Chiba needle was advanced through a short transhepatic course and into the gallbladder lumen. Under real-time ultrasound guidance, the 0.021 wire was coiled in the gallbladder lumen. The needle was removed. Under real-time ultrasound guidance, the Accustick transitional sheath was advanced over the wire and into the gallbladder lumen. The stylet and wire were removed. There was return of dark black bile. A 0.035 wire was coiled within the gallbladder lumen. Again, utilizing real time ultrasound guidance, the percutaneous tract was dilated to 10 [place] and a [place] all-purpose drainage catheter was carefully advanced in the gallbladder lumen. Small amount of bile was aspirated and sent for culture. The drain was then connected to gravity bag drainage and secured to the skin with 0 Prolene suture and an adhesive fixation device. The patient tolerated the procedure well. IMPRESSION: Successful placement of a percutaneous transhepatic cholecystostomy tube using ultrasound imaging guidance. Samples of bile were sent for Gram stain and culture. Electronically Signed   By: Jacqulynn Cadet M.D.   On: 12/29/2021 16:06    ECHOCARDIOGRAM COMPLETE  Result Date: 12/29/2021    ECHOCARDIOGRAM REPORT   Patient Name:   [name] Date of Exam: 12/29/2021 Medical Rec #:  [id number]         Height:       66.0 in Accession #:    [id number]        Weight:       191.4 lb Date of Birth:  02-08-1946         BSA:          1.963 m Patient Age:    58 years          BP:           107/67 mmHg Patient Gender: M                 HR:           106 bpm. Exam Location:  Inpatient Procedure: 2D Echo Indications:    Atrial fibrillation  History:        Patient has prior history of Echocardiogram examinations, most                 recent 08/29/2021. CAD, Aortic Valve Disease, Arrythmias:Atrial                 Fibrillation, Signs/Symptoms:Murmur; Risk Factors:Hypertension                 and Dyslipidemia.  Sonographer:    [name] RDCS Referring Phys: [place]  1. Left ventricular ejection fraction,  by estimation, is 60 to 65%. The left ventricle has normal function. The left ventricle has no regional wall motion abnormalities. Left ventricular diastolic parameters are indeterminate.  2. Right ventricular systolic function is normal. The right ventricular size is normal.  3. Left atrial size was moderately dilated.  4. The mitral valve is normal in structure. Mild mitral valve regurgitation. No evidence of mitral stenosis.  5. The aortic valve is calcified. Aortic valve regurgitation is mild. Moderate aortic valve stenosis. Comparison(s): No significant change from prior study. FINDINGS  Left Ventricle: Left ventricular ejection fraction, by estimation, is 60 to 65%. The left ventricle has normal function. The left ventricle has no regional wall motion abnormalities. The left ventricular internal cavity size was normal in size. There is  no left ventricular hypertrophy. Left ventricular diastolic parameters are indeterminate. Right Ventricle: The right ventricular size is normal. Right ventricular systolic function is  normal. Left Atrium: Left atrial size was moderately dilated. Right Atrium: Right atrial size was normal in size. Pericardium: There is no evidence of pericardial effusion. Mitral Valve: The mitral valve is normal in structure. Mild mitral annular calcification. Mild mitral valve regurgitation. No evidence of mitral valve stenosis. Tricuspid Valve: The tricuspid valve is normal in structure. Tricuspid valve regurgitation is mild . No evidence of tricuspid stenosis. Aortic Valve: The aortic valve is calcified. Aortic valve regurgitation is mild. Moderate aortic stenosis is present. Aortic valve mean gradient measures 17.3 mmHg. Aortic valve peak gradient measures 30.6 mmHg. Aortic valve area, by VTI measures 1.11 cm. Pulmonic Valve: The pulmonic valve was normal in structure. Pulmonic valve regurgitation is trivial. No evidence of pulmonic stenosis. Aorta: The aortic root is normal in size and structure. Venous: The inferior vena cava was not well visualized. IAS/Shunts: The interatrial septum was not well visualized.  LEFT VENTRICLE PLAX 2D LVIDd:         3.90 cm LVIDs:         2.70 cm LV PW:         1.10 cm LV IVS:        1.00 cm LVOT diam:     2.20 cm LV SV:         51 LV SV Index:   26 LVOT Area:     3.80 cm  RIGHT VENTRICLE RV S prime:     10.60 cm/s TAPSE (M-mode): 1.1 cm LEFT ATRIUM           Index        RIGHT ATRIUM           Index LA diam:      5.10 cm 2.60 cm/m   RA Area:     16.50 cm LA Vol (A4C): 80.6 ml 41.07 ml/m  RA Volume:   39.10 ml  19.92 ml/m  AORTIC VALVE AV Area (Vmax):    1.05 cm AV Area (Vmean):   1.00 cm AV Area (VTI):     1.11 cm AV Vmax:           276.67 cm/s AV Vmean:          195.000 cm/s AV VTI:            0.455 m AV Peak Grad:      30.6 mmHg AV Mean Grad:      17.3 mmHg LVOT Vmax:         76.13 cm/s LVOT Vmean:        51.433 cm/s LVOT VTI:  0.133 m LVOT/AV VTI ratio: 0.29  AORTA Ao Root diam: 3.30 cm Ao Asc diam:  3.30 cm TRICUSPID VALVE TR Peak grad:   43.6 mmHg TR  Vmax:        330.00 cm/s  SHUNTS Systemic VTI:  0.13 m Systemic Diam: 2.20 cm Kirk Ruths MD Electronically signed by Kirk Ruths MD Signature Date/Time: 12/29/2021/2:08:02 PM    Final    DG Chest Port 1 View  Result Date: 12/29/2021 CLINICAL DATA:  Pulmonary edema EXAM: PORTABLE CHEST 1 VIEW COMPARISON:  Portable exam 1201 hours compared to 08/04/2013 FINDINGS: Enlargement of cardiac silhouette. Atherosclerotic calcification aorta. Decreased lung volumes with bibasilar atelectasis and central peribronchial thickening. No definite infiltrate, pleural effusion, or pneumothorax. Osseous demineralization with BILATERAL shoulder prostheses. IMPRESSION: Enlargement of cardiac silhouette with bibasilar atelectasis. Aortic Atherosclerosis (ICD10-I70.0). Electronically Signed   By: Lavonia Dana M.D.   On: 12/29/2021 12:15   US Abdomen Limited RUQ (LIVER/GB)  Result Date: 12/28/2021 CLINICAL DATA:  Follow-up from CT a chest, abdomen and pelvis that showed findings consistent with acute cholecystitis. Patient being evaluated for chest and abdominal pain and GI bleeding. EXAM: ULTRASOUND ABDOMEN LIMITED RIGHT UPPER QUADRANT COMPARISON:  Current CTA chest, abdomen and pelvis. FINDINGS: Gallbladder: Gallbladder moderately distended. Wall measures 5 mm in thickness. Trace pericholecystic fluid around the fundus. No defined stones, but dependent sludge is evident. No sonographic Murphy's sign. Common bile duct: Diameter: 4-5 mm. Liver: No focal lesion identified. Within normal limits in parenchymal echogenicity. Portal vein is patent on color Doppler imaging with normal direction of blood flow towards the liver. Other: None. IMPRESSION: 1. Sonographic findings correlate with the CT findings. Gallbladder is distended with mild wall thickening. There is a small amount of pericholecystic fluid. No defined stone, but dependent sludge is evident. Findings support acute cholecystitis in the proper clinical setting. If clinical  findings are equivocal, consider follow-up HIDA scan. Electronically Signed   By: Lajean Manes M.D.   On: 12/28/2021 11:41   CT Angio Chest/Abd/Pel for Dissection W and/or W/WO  Result Date: 12/28/2021 CLINICAL DATA:  Acute aortic syndrome suspected, chest and abdominal pain, GI bleed EXAM: CT ANGIOGRAPHY CHEST, ABDOMEN AND PELVIS TECHNIQUE: Non-contrast CT of the chest was initially obtained. Multidetector CT imaging through the chest, abdomen and pelvis was performed using the standard protocol during bolus administration of intravenous contrast. Multiplanar reconstructed images and MIPs were obtained and reviewed to evaluate the vascular anatomy. RADIATION DOSE REDUCTION: This exam was performed according to the departmental dose-optimization program which includes automated exposure control, adjustment of the mA and/or kV according to patient size and/or use of iterative reconstruction technique. CONTRAST:  126m OMNIPAQUE IOHEXOL 350 MG/ML SOLN IV COMPARISON:  Noncontrast CT chest 09/06/2007 FINDINGS: CTA CHEST FINDINGS Cardiovascular: Atherosclerotic calcifications aorta, proximal great vessels and coronary arteries. Aorta normal caliber. Heart size normal. No pericardial effusion. Question thrombus versus unopacified blood within LEFT atrial appendage. Normal aortic enhancement without dissection. Pulmonary arteries patent without evidence of pulmonary embolism. Mediastinum/Nodes: Esophagus unremarkable. Few normal sized mediastinal lymph nodes. Base of cervical region normal appearance. No thoracic adenopathy. Lungs/Pleura: Dependent atelectasis posterior lower lobes. Lungs otherwise clear. No pulmonary infiltrate, pleural effusion, or pneumothorax. Musculoskeletal: BILATERAL shoulder prostheses. No acute osseous findings. Review of the MIP images confirms the above findings. CTA ABDOMEN AND PELVIS FINDINGS VASCULAR Aorta: Aorta normal caliber without aneurysm or dissection. Celiac: Minimal plaque at  origin.  No significant narrowing. SMA: Mild plaque at origin.  Less than 50% narrowing. Renals: Plaque at  origins of BILATERAL renal arteries, greater D% RIGHT, less than 50% LEFT. Accessory LEFT renal artery. IMA: Patent Inflow: Atherosclerotic calcifications with narrowing of RIGHT greater than LEFT external iliac arteries. Veins: Unremarkable Review of the MIP images confirms the above findings. NON-VASCULAR Hepatobiliary: Liver unremarkable. Gallbladder distended with dependent calculi, wall thickening and pericholecystic infiltrative changes suspicious for acute cholecystitis. No biliary dilatation. Pancreas: Normal appearance Spleen: Normal appearance Adrenals/Urinary Tract: Adrenal glands normal appearance. Small BILATERAL renal cysts; no follow-up imaging recommended. Tiny nonobstructing calculus upper pole RIGHT kidney. No solid renal mass, hydronephrosis, hydroureter, or ureteral calcification. Bladder unremarkable. Stomach/Bowel: Diverticulosis of descending and sigmoid colon without evidence of diverticulitis. Normal appendix. Stomach and bowel loops unremarkable. Lymphatic: No adenopathy Reproductive: Prostatic enlargement with gland measuring 5.8 x 5.6 x 5.3 cm (volume = 90 cm3) Other: No free air or free fluid.  No hernia. Musculoskeletal: Degenerative disc disease changes lumbar spine. Review of the MIP images confirms the above findings. IMPRESSION: No evidence of aortic aneurysm or dissection. Extensive atherosclerotic calcifications as above including coronary arteries. Question thrombus versus unopacified blood within LEFT atrial appendage; can assess by echocardiography. Cholelithiasis with calculi, wall thickening and surrounding inflammatory changes highly suspicious for acute cholecystitis. Distal colonic diverticulosis without evidence of diverticulitis. Prostatic enlargement. Aortic Atherosclerosis (ICD10-I70.0). Electronically Signed   By: Lavonia Dana M.D.   On: 12/28/2021 10:44     Labs:  CBC: Recent Labs    07/29/21 0930 12/28/21 0943 12/28/21 0959 12/29/21 0054 12/29/21 1830 12/29/21 2035 12/30/21 0350  WBC 11.2* 30.2*  --  21.3*  --   --  13.4*  HGB 15.9 14.2   < > 11.6* 11.6* 9.5* 10.4*  HCT 45.0 40.9   < > 31.7* 34.0* 28.0* 29.3*  PLT 226 198  --  132*  --   --  105*   < > = values in this interval not displayed.    COAGS: Recent Labs    12/10/21 0812 12/28/21 0943 12/28/21 1638 12/29/21 0054  INR 3.3* 7.3* 2.8* 1.7*    BMP: Recent Labs    10/10/21 0905 10/10/21 0905 12/28/21 0943 12/28/21 0959 12/29/21 0054 12/29/21 1830 12/29/21 2035 12/30/21 0350  NA 142   < > 136 134* 137 136 137 137  K 4.4  --  3.7 3.7 3.1* 4.0 3.6 3.5  CL 102  --  102 105 103  --   --  101  CO2 23  --  18*  --  20*  --   --  22  GLUCOSE 123*  --  138* 131* 88  --   --  124*  BUN 34*  --  33* 33* 35*  --   --  48*  CALCIUM 9.4  --  9.1  --  8.1*  --   --  8.4*  CREATININE 1.41*  --  3.08* 2.90* 2.30*  --   --  2.42*  GFRNONAA  --   --  20*  --  29*  --   --  27*   < > = values in this interval not displayed.    LIVER FUNCTION TESTS: Recent Labs    12/28/21 0943  BILITOT 2.3*  AST 28  ALT 13  ALKPHOS 51  PROT 7.0  ALBUMIN 3.0*    Assessment and Plan:  77 y.o. male with PMHs of HTN, paroxysmal atrial fibrillation, hyperlipidemia, CAD, on Coumadin who presented to [facility] ED on 7/1 and sound found to septic, most likely due to acute cholecystitis. Perc  chole requested on 7/1, was unable to proceed due to supratherapeutic INR of 7.3. Patient was admitted to ICU and supratherapeutic  INR was corrected, underwent perc chole placement on 7/2 by Dr. Laurence Ferrari.   Procedure occurred w/o difficult and patient was transferred to ICU in stable condition; however, he developed worsening hypoxia with confusion, which required intubation.   WBC trending down, 13.4 (21.3 yesterday)  Still hypotensive, on pressors  OP 420 mL - appears bile Cx shows abundant gram  (-) rods so far   Drain Location: RUQ Size: Fr size: 10 Fr Date of placement: 7/2  Currently to: Drain collection device: gravity 24 hour output:  Output by Drain (mL) 12/28/21 0701 - 12/28/21 1900 12/28/21 1901 - 12/29/21 0700 12/29/21 0701 - 12/29/21 1900 12/29/21 1901 - 12/30/21 0700 12/30/21 0701 - 12/30/21 0932  Biliary Tube Cook slip-coat 10.2 Fr. RUQ   120 300     Interval imaging/drain manipulation:  None   Current examination: Flushes/aspirates easily.  Insertion site unremarkable. Suture and stat lock in place. Dressed appropriately.   Plan: Continue TID flushes with 5 cc NS. Record output Q shift. Dressing changes QD or PRN if soiled.  Call IR APP or on call IR MD if difficulty flushing or sudden change in drain output.  Repeat imaging/possible drain injection once output < 10 mL/QD (excluding flush material). Consideration for drain removal if output is < 10 mL/QD (excluding flush material), pending discussion with the providing surgical service.  Discharge planning: Please contact IR APP or on call IR MD prior to patient d/c to ensure appropriate follow up plans are in place. Typically patient will follow up with IR clinic 10-14 days post d/c for repeat imaging/possible drain injection. IR scheduler will contact patient with date/time of appointment. Patient will need to flush drain QD with 5 cc NS, record output QD, dressing changes every 2-3 days or earlier if soiled.   IR will continue to follow - please call with questions or concerns.   Electronically Signed: Tera Mater, PA-C 12/30/2021, 9:27 AM   I spent a total of 15 Minutes at the the patient's bedside AND on the patient's [facility] floor or unit, greater than 50% of which was counseling/coordinating care for Perc chole.   This chart was dictated using voice recognition software.  Despite best efforts to proofread,  errors can occur which can change the documentation meaning.

## 2021-12-30 NOTE — Progress Notes (Signed)
[facility] Progress Note Patient Name: [name] DOB: June 10, 1946 MRN: [id number]   Date of Service  12/30/2021  HPI/Events of Note  Notified of K at 3.5.  Crea 2.42.  eICU Interventions  Replete K - 66mq via tube once.      Intervention Category Minor Interventions: Electrolytes abnormality - evaluation and management  VElsie Lincoln7/08/2021, 5:16 AM

## 2021-12-30 NOTE — Progress Notes (Addendum)
  Transition of Care [facility]) Screening Note   Patient Details  Name: [name] Date of Birth: 1946/06/29   Transition of Care [facility]) CM/SW Contact:    Tom-Johnson, Renea Ee, RN Phone Number: 12/30/2021, 4:03 PM  Patient admitted for Acute Cholecystitis. Had  Percutaneous cholecystostomy tube placed on 12/221. Surgery following. Currently intubated, on IV abx and drips.  Transition of Care Department [facility]) has reviewed patient and no TOC needs or recommendations have been identified at this time. TOC will continue to monitor patient advancement through interdisciplinary progression rounds. If new patient transition needs arise, please place a TOC consult.

## 2021-12-30 NOTE — Progress Notes (Signed)
[facility] Progress Note     Subjective: CC:  Patient intubated yesterday for hypoxic respiratory failure with AMS.   Afebrile, on levo/vaso WBC 13 from 21  Objective: Vital signs in last 24 hours: Temp:  [97.9 F (36.6 C)-99.6 F (37.6 C)] 97.9 F (36.6 C) (07/03 1101) Pulse Rate:  [77-114] 77 (07/03 1300) Resp:  [15-35] 18 (07/03 1300) BP: (57-151)/(10-98) 88/59 (07/03 1300) SpO2:  [78 %-100 %] 92 % (07/03 1300) FiO2 (%):  [40 %-100 %] 40 % (07/03 1300) Weight:  [87.6 kg] 87.6 kg (07/03 0500) Last BM Date : 12/28/21  Intake/Output from previous day: 07/02 0701 - 07/03 0700 In: 1647 [I.V.:402.4; IV Piggyback:1229.6] Out: 0630 [Urine:1050; Drains:420] Intake/Output this shift: Total I/O In: 649.3 [I.V.:410.4; NG/GT:135; IV Piggyback:103.9] Out: 140 [Drains:140]  PE: Gen:  intubated, sedated Card:  Regular rate and rhythm Abd: Soft, perc chole tube to gravity in RUW w/ bilious effluent Skin: warm and dry, no rashes  Neuro: purposeful movement to noxious stimuli (reaches for ETT)  Lab Results:  Recent Labs    12/29/21 0054 12/29/21 1830 12/29/21 2035 12/30/21 0350  WBC 21.3*  --   --  13.4*  HGB 11.6*   < > 9.5* 10.4*  HCT 31.7*   < > 28.0* 29.3*  PLT 132*  --   --  105*   < > = values in this interval not displayed.   BMET Recent Labs    12/29/21 0054 12/29/21 1830 12/29/21 2035 12/30/21 0350  NA 137   < > 137 137  K 3.1*   < > 3.6 3.5  CL 103  --   --  101  CO2 20*  --   --  22  GLUCOSE 88  --   --  124*  BUN 35*  --   --  48*  CREATININE 2.30*  --   --  2.42*  CALCIUM 8.1*  --   --  8.4*   < > = values in this interval not displayed.   PT/INR Recent Labs    12/28/21 1638 12/29/21 0054  LABPROT 29.3* 19.7*  INR 2.8* 1.7*   CMP     Component Value Date/Time   NA 137 12/30/2021 0350   NA 142 10/10/2021 0905   K 3.5 12/30/2021 0350   CL 101 12/30/2021 0350   CO2 22 12/30/2021 0350   GLUCOSE 124 (H) 12/30/2021 0350    BUN 48 (H) 12/30/2021 0350   BUN 34 (H) 10/10/2021 0905   CREATININE 2.42 (H) 12/30/2021 0350   CREATININE 1.01 11/13/2014 0846   CALCIUM 8.4 (L) 12/30/2021 0350   PROT 7.0 12/28/2021 0943   PROT 6.9 09/08/2019 0936   ALBUMIN 3.0 (L) 12/28/2021 0943   ALBUMIN 4.2 09/08/2019 0936   AST 28 12/28/2021 0943   ALT 13 12/28/2021 0943   ALKPHOS 67 12/28/2021 0943   BILITOT 2.3 (H) 12/28/2021 0943   BILITOT 0.4 09/08/2019 0936   GFRNONAA 27 (L) 12/30/2021 0350   GFRNONAA 77 11/13/2014 0846   GFRAA 68 03/12/2020 0904   GFRAA 89 11/13/2014 0846   Lipase     Component Value Date/Time   LIPASE 25 12/28/2021 0943       Studies/Results: DG Chest Port 1 View  Result Date: 12/30/2021 CLINICAL DATA:  Endotracheal tube EXAM: PORTABLE CHEST 1 VIEW COMPARISON:  Radiograph 12/29/2021 FINDINGS: Endotracheal tube tip overlies the distal trachea approximately 1.4 cm above the carina. Left neck catheter tip overlies the superior cavoatrial junction. Orogastric  tube passes below the diaphragm, tip excluded by collimation. Unchanged enlarged cardiomediastinal silhouette. Low lung volumes. Increased right basilar atelectasis. Stable trace pleural effusions. No pneumothorax. Unchanged bilateral shoulder arthroplasties. IMPRESSION: Endotracheal tube tip overlies the distal trachea approximately 1.4 cm by the carina, would consider retraction by 1.0 cm. Increasing right basilar atelectasis. Stable probable trace pleural effusions. Electronically Signed   By: Maurine Simmering M.D.   On: 12/30/2021 08:07   DG CHEST PORT 1 VIEW  Result Date: 12/29/2021 CLINICAL DATA:  Central line placement. EXAM: PORTABLE CHEST 1 VIEW COMPARISON:  Radiograph 45 minutes ago, CT yesterday FINDINGS: New left internal jugular central venous catheter tip overlies the atrial caval junction. No pneumothorax. Endotracheal tube tip just below the clavicular heads. Tip and side port of the enteric tube below the diaphragm in the stomach.  Persistent low lung volumes. Stable heart size and mediastinal contours, bibasilar atelectasis. Possible developing pleural effusions. Cholecystostomy tube in the right upper quadrant. Dense aortic atherosclerosis. IMPRESSION: 1. New left internal jugular central venous catheter with tip overlying the atrial caval junction. No pneumothorax. 2. Persistent low lung volumes with bibasilar atelectasis and possible developing pleural effusions. Electronically Signed   By: Keith Rake M.D.   On: 12/29/2021 19:57   DG CHEST PORT 1 VIEW  Result Date: 12/29/2021 CLINICAL DATA:  [id number]; enteric tube placement EXAM: PORTABLE CHEST 1 VIEW; portable abdomen one view COMPARISON:  CT dated December 28, 2021 FINDINGS: The cardiomediastinal silhouette is unchanged in contour.ETT tip terminates 2.5 Cm above the carina. Small LEFT pleural effusion. Bibasilar opacities likely atelectasis. No pneumothorax. Atherosclerotic calcifications throughout the aorta. Enteric tube tip and side port project over the distal stomach. Percutaneous cholecystectomy tube projects over the RIGHT abdomen. New gaseous dilation of multiple loops of small bowel, likely ileus given recent surgical intervention. Status post bilateral shoulder arthroplasty. IMPRESSION: 1. Enteric tube tip and side port project over the distal stomach. 2. ETT tip terminates between the thoracic inlet and the carina. 3. New gaseous dilation of loops of small bowel. This likely reflect ileus given recent percutaneous cholecystectomy tube placement. If persistent concern for obstruction, recommend dedicated CT or serial KUBs. 4. Small LEFT pleural effusion. Electronically Signed   By: Valentino Saxon M.D.   On: 12/29/2021 19:10   DG Abd Portable 1V  Result Date: 12/29/2021 CLINICAL DATA:  [id number]; enteric tube placement EXAM: PORTABLE CHEST 1 VIEW; portable abdomen one view COMPARISON:  CT dated December 28, 2021 FINDINGS: The cardiomediastinal silhouette is  unchanged in contour.ETT tip terminates 2.5 Cm above the carina. Small LEFT pleural effusion. Bibasilar opacities likely atelectasis. No pneumothorax. Atherosclerotic calcifications throughout the aorta. Enteric tube tip and side port project over the distal stomach. Percutaneous cholecystectomy tube projects over the RIGHT abdomen. New gaseous dilation of multiple loops of small bowel, likely ileus given recent surgical intervention. Status post bilateral shoulder arthroplasty. IMPRESSION: 1. Enteric tube tip and side port project over the distal stomach. 2. ETT tip terminates between the thoracic inlet and the carina. 3. New gaseous dilation of loops of small bowel. This likely reflect ileus given recent percutaneous cholecystectomy tube placement. If persistent concern for obstruction, recommend dedicated CT or serial KUBs. 4. Small LEFT pleural effusion. Electronically Signed   By: Valentino Saxon M.D.   On: 12/29/2021 19:10   IR Perc Cholecystostomy  Result Date: 12/29/2021 INDICATION: 76 year old male with acute cholecystitis and active sepsis. He is currently not a surgical candidate and presents for percutaneous cholecystostomy tube placement. Incidentally,  the interventional radiology suite is currently down. Therefore, we will proceed using ultrasound guidance only out of necessity. EXAM: CHOLECYSTOSTOMY MEDICATIONS: Patient currently receiving intravenous Zosyn. No additional antibiotic prophylaxis employed. ANESTHESIA/SEDATION: Moderate (conscious) sedation was employed during this procedure. A total of Versed 0.5 mg and Fentanyl 25 mcg was administered intravenously. Moderate Sedation Time: 11 minutes. The patient's level of consciousness and vital signs were monitored continuously by radiology nursing throughout the procedure under my direct supervision. FLUOROSCOPY TIME:  None. COMPLICATIONS: None immediate. PROCEDURE: Informed written consent was obtained from the patient after a thorough  discussion of the procedural risks, benefits and alternatives. All questions were addressed. Maximal Sterile Barrier Technique was utilized including caps, mask, sterile gowns, sterile gloves, sterile drape, hand hygiene and skin antiseptic. A timeout was performed prior to the initiation of the procedure. Ultrasound was used to interrogate the right upper quadrant. The gallbladder is distended. There is a small amount of perihepatic ascites. A suitable skin entry site was selected and marked. Local anesthesia was attained by infiltration with 1% lidocaine. A small dermatotomy was made. Under real-time ultrasound guidance, a 21 gauge Chiba needle was advanced through a short transhepatic course and into the gallbladder lumen. Under real-time ultrasound guidance, the 0.021 wire was coiled in the gallbladder lumen. The needle was removed. Under real-time ultrasound guidance, the Accustick transitional sheath was advanced over the wire and into the gallbladder lumen. The stylet and wire were removed. There was return of dark black bile. A 0.035 wire was coiled within the gallbladder lumen. Again, utilizing real time ultrasound guidance, the percutaneous tract was dilated to 10 [place] and a [place] all-purpose drainage catheter was carefully advanced in the gallbladder lumen. Small amount of bile was aspirated and sent for culture. The drain was then connected to gravity bag drainage and secured to the skin with 0 Prolene suture and an adhesive fixation device. The patient tolerated the procedure well. IMPRESSION: Successful placement of a percutaneous transhepatic cholecystostomy tube using ultrasound imaging guidance. Samples of bile were sent for Gram stain and culture. Electronically Signed   By: Jacqulynn Cadet M.D.   On: 12/29/2021 16:06   ECHOCARDIOGRAM COMPLETE  Result Date: 12/29/2021    ECHOCARDIOGRAM REPORT   Patient Name:   JORELL AGNE Date of Exam: 12/29/2021 Medical Rec #:  [id number]          Height:       66.0 in Accession #:    [id number]        Weight:       191.4 lb Date of Birth:  03/30/1946         BSA:          1.963 m Patient Age:    76 years          BP:           107/67 mmHg Patient Gender: M                 HR:           106 bpm. Exam Location:  Inpatient Procedure: 2D Echo Indications:    Atrial fibrillation  History:        Patient has prior history of Echocardiogram examinations, most                 recent 08/29/2021. CAD, Aortic Valve Disease, Arrythmias:Atrial                 Fibrillation, Signs/Symptoms:Murmur; Risk Factors:Hypertension  and Dyslipidemia.  Sonographer:    [name] RDCS Referring Phys: [place]  1. Left ventricular ejection fraction, by estimation, is 60 to 65%. The left ventricle has normal function. The left ventricle has no regional wall motion abnormalities. Left ventricular diastolic parameters are indeterminate.  2. Right ventricular systolic function is normal. The right ventricular size is normal.  3. Left atrial size was moderately dilated.  4. The mitral valve is normal in structure. Mild mitral valve regurgitation. No evidence of mitral stenosis.  5. The aortic valve is calcified. Aortic valve regurgitation is mild. Moderate aortic valve stenosis. Comparison(s): No significant change from prior study. FINDINGS  Left Ventricle: Left ventricular ejection fraction, by estimation, is 60 to 65%. The left ventricle has normal function. The left ventricle has no regional wall motion abnormalities. The left ventricular internal cavity size was normal in size. There is  no left ventricular hypertrophy. Left ventricular diastolic parameters are indeterminate. Right Ventricle: The right ventricular size is normal. Right ventricular systolic function is normal. Left Atrium: Left atrial size was moderately dilated. Right Atrium: Right atrial size was normal in size. Pericardium: There is no evidence of pericardial effusion.  Mitral Valve: The mitral valve is normal in structure. Mild mitral annular calcification. Mild mitral valve regurgitation. No evidence of mitral valve stenosis. Tricuspid Valve: The tricuspid valve is normal in structure. Tricuspid valve regurgitation is mild . No evidence of tricuspid stenosis. Aortic Valve: The aortic valve is calcified. Aortic valve regurgitation is mild. Moderate aortic stenosis is present. Aortic valve mean gradient measures 17.3 mmHg. Aortic valve peak gradient measures 30.6 mmHg. Aortic valve area, by VTI measures 1.11 cm. Pulmonic Valve: The pulmonic valve was normal in structure. Pulmonic valve regurgitation is trivial. No evidence of pulmonic stenosis. Aorta: The aortic root is normal in size and structure. Venous: The inferior vena cava was not well visualized. IAS/Shunts: The interatrial septum was not well visualized.  LEFT VENTRICLE PLAX 2D LVIDd:         3.90 cm LVIDs:         2.70 cm LV PW:         1.10 cm LV IVS:        1.00 cm LVOT diam:     2.20 cm LV SV:         51 LV SV Index:   26 LVOT Area:     3.80 cm  RIGHT VENTRICLE RV S prime:     10.60 cm/s TAPSE (M-mode): 1.1 cm LEFT ATRIUM           Index        RIGHT ATRIUM           Index LA diam:      5.10 cm 2.60 cm/m   RA Area:     16.50 cm LA Vol (A4C): 80.6 ml 41.07 ml/m  RA Volume:   39.10 ml  19.92 ml/m  AORTIC VALVE AV Area (Vmax):    1.05 cm AV Area (Vmean):   1.00 cm AV Area (VTI):     1.11 cm AV Vmax:           276.67 cm/s AV Vmean:          195.000 cm/s AV VTI:            0.455 m AV Peak Grad:      30.6 mmHg AV Mean Grad:      17.3 mmHg LVOT Vmax:  76.13 cm/s LVOT Vmean:        51.433 cm/s LVOT VTI:          0.133 m LVOT/AV VTI ratio: 0.29  AORTA Ao Root diam: 3.30 cm Ao Asc diam:  3.30 cm TRICUSPID VALVE TR Peak grad:   43.6 mmHg TR Vmax:        330.00 cm/s  SHUNTS Systemic VTI:  0.13 m Systemic Diam: 2.20 cm Kirk Ruths MD Electronically signed by Kirk Ruths MD Signature Date/Time: 12/29/2021/2:08:02  PM    Final    DG Chest Port 1 View  Result Date: 12/29/2021 CLINICAL DATA:  Pulmonary edema EXAM: PORTABLE CHEST 1 VIEW COMPARISON:  Portable exam 1201 hours compared to 08/04/2013 FINDINGS: Enlargement of cardiac silhouette. Atherosclerotic calcification aorta. Decreased lung volumes with bibasilar atelectasis and central peribronchial thickening. No definite infiltrate, pleural effusion, or pneumothorax. Osseous demineralization with BILATERAL shoulder prostheses. IMPRESSION: Enlargement of cardiac silhouette with bibasilar atelectasis. Aortic Atherosclerosis (ICD10-I70.0). Electronically Signed   By: Lavonia Dana M.D.   On: 12/29/2021 12:15    Anti-infectives: Anti-infectives (From admission, onward)    Start     Dose/Rate Route Frequency Ordered Stop   12/28/21 1600  piperacillin-tazobactam (ZOSYN) IVPB 3.375 g        3.375 g 12.5 mL/hr over 240 Minutes Intravenous Every 8 hours 12/28/21 1419     12/28/21 1600  cefOXitin (MEFOXIN) 2 g in sodium chloride 0.9 % 100 mL IVPB  Status:  Discontinued        2 g 200 mL/hr over 30 Minutes Intravenous To Radiology 12/28/21 1509 12/29/21 1437   12/28/21 1256  vancomycin variable dose per unstable renal function (pharmacist dosing)  Status:  Discontinued         Does not apply See admin instructions 12/28/21 1256 12/28/21 1419   12/28/21 1015  piperacillin-tazobactam (ZOSYN) IVPB 3.375 g        3.375 g 100 mL/hr over 30 Minutes Intravenous  Once 12/28/21 1001 12/28/21 1029   12/28/21 1015  vancomycin (VANCOREADY) IVPB 1750 mg/350 mL        1,750 mg 175 mL/hr over 120 Minutes Intravenous  Once 12/28/21 1003 12/28/21 1254        Assessment/Plan Acute cholecystitis S/p percutaneous cholecystostomy tube 7/2, 10 Fr, Cx pending (GS- GNR) - management of septic shock per CCM - follow cultures and continue broad spectrum abx - will need outpatient follow up with [facility] to discuss interval cholecystectomy once he recovers  FEN: NPO, IVF; ok for tube  feeds from CCS standpoint ID: Zosyn Foley: in place, > 1L UOP Dispo: ICU   Below per CCM-- Septic shock due to above  Acute respiratory failure Supratherapeutic INR   LOS: 2 days   I reviewed nursing notes, Consultant IR notes, hospitalist notes, last 24 h vitals and pain scores, last 48 h intake and output, last 24 h labs and trends, and last 24 h imaging results.   Obie Dredge, PA-C [facility] Please see Amion for pager number during day hours 7:00am-4:30pm

## 2021-12-31 DIAGNOSIS — K810 Acute cholecystitis: Secondary | ICD-10-CM | POA: Diagnosis not present

## 2021-12-31 LAB — GLUCOSE, CAPILLARY
Glucose-Capillary: 148 mg/dL — ABNORMAL HIGH (ref 70–99)
Glucose-Capillary: 166 mg/dL — ABNORMAL HIGH (ref 70–99)
Glucose-Capillary: 162 mg/dL — ABNORMAL HIGH (ref 70–99)
Glucose-Capillary: 144 mg/dL — ABNORMAL HIGH (ref 70–99)
Glucose-Capillary: 141 mg/dL — ABNORMAL HIGH (ref 70–99)
Glucose-Capillary: 141 mg/dL — ABNORMAL HIGH (ref 70–99)

## 2021-12-31 LAB — CBC
HCT: 29.8 % — ABNORMAL LOW (ref 39.0–52.0)
HCT: 28 % — ABNORMAL LOW (ref 39.0–52.0)
Hemoglobin: 10.4 g/dL — ABNORMAL LOW (ref 13.0–17.0)
Hemoglobin: 9.7 g/dL — ABNORMAL LOW (ref 13.0–17.0)
MCH: 32.9 pg (ref 26.0–34.0)
MCH: 32.4 pg (ref 26.0–34.0)
MCHC: 34.9 g/dL (ref 30.0–36.0)
MCHC: 34.6 g/dL (ref 30.0–36.0)
MCV: 94.3 fL (ref 80.0–100.0)
MCV: 93.6 fL (ref 80.0–100.0)
Platelets: 97 10*3/uL — ABNORMAL LOW (ref 150–400)
Platelets: 101 10*3/uL — ABNORMAL LOW (ref 150–400)
RBC: 3.16 MIL/uL — ABNORMAL LOW (ref 4.22–5.81)
RBC: 2.99 MIL/uL — ABNORMAL LOW (ref 4.22–5.81)
RDW: 14.3 % (ref 11.5–15.5)
RDW: 14.5 % (ref 11.5–15.5)
WBC: 12.6 10*3/uL — ABNORMAL HIGH (ref 4.0–10.5)
WBC: 13.7 10*3/uL — ABNORMAL HIGH (ref 4.0–10.5)
nRBC: 0 % (ref 0.0–0.2)
nRBC: 0 % (ref 0.0–0.2)

## 2021-12-31 LAB — PROTIME-INR
INR: 1.9 — ABNORMAL HIGH (ref 0.8–1.2)
Prothrombin Time: 21.7 seconds — ABNORMAL HIGH (ref 11.4–15.2)

## 2021-12-31 LAB — BASIC METABOLIC PANEL
Anion gap: 14 (ref 5–15)
BUN: 61 mg/dL — ABNORMAL HIGH (ref 8–23)
CO2: 23 mmol/L (ref 22–32)
Calcium: 8.6 mg/dL — ABNORMAL LOW (ref 8.9–10.3)
Chloride: 103 mmol/L (ref 98–111)
Creatinine, Ser: 3.17 mg/dL — ABNORMAL HIGH (ref 0.61–1.24)
GFR, Estimated: 20 mL/min — ABNORMAL LOW (ref >60)
Glucose, Bld: 141 mg/dL — ABNORMAL HIGH (ref 70–99)
Potassium: 3.8 mmol/L (ref 3.5–5.1)
Sodium: 140 mmol/L (ref 135–145)

## 2021-12-31 LAB — PHOSPHORUS
Phosphorus: 3 mg/dL (ref 2.5–4.6)
Phosphorus: 3.6 mg/dL (ref 2.5–4.6)

## 2021-12-31 LAB — HEPARIN LEVEL (UNFRACTIONATED): Heparin Unfractionated: 0.14 IU/mL — ABNORMAL LOW (ref 0.30–0.70)

## 2021-12-31 LAB — MAGNESIUM
Magnesium: 2.6 mg/dL — ABNORMAL HIGH (ref 1.7–2.4)
Magnesium: 2.7 mg/dL — ABNORMAL HIGH (ref 1.7–2.4)

## 2021-12-31 MED ORDER — POLYETHYLENE GLYCOL 3350 17 G PO PACK
17.0000 g | PACK | Freq: Every day | ORAL | Status: DC | PRN
Start: 2021-12-31 — End: 2022-01-10

## 2021-12-31 MED ORDER — HEPARIN (PORCINE) 25000 UT/250ML-% IV SOLN
1300.0000 [IU]/h | INTRAVENOUS | Status: DC
  Administered 2021-12-31: 1100 [IU]/h via INTRAVENOUS
  Filled 2021-12-31: qty 250

## 2021-12-31 MED ORDER — SODIUM CHLORIDE 0.9 % IR SOLN
3000.0000 mL | Status: DC
  Administered 2021-12-31 – 2022-01-04 (×17): 3000 mL

## 2021-12-31 MED ORDER — LIDOCAINE HCL URETHRAL/MUCOSAL 2 % EX GEL
1.0000 | Freq: Once | CUTANEOUS | Status: AC
  Administered 2021-12-31: 1 via URETHRAL
  Filled 2021-12-31: qty 6

## 2021-12-31 MED ORDER — FUROSEMIDE 10 MG/ML IJ SOLN
120.0000 mg | Freq: Once | INTRAVENOUS | Status: AC
  Administered 2021-12-31: 120 mg via INTRAVENOUS
  Filled 2021-12-31: qty 10

## 2021-12-31 NOTE — Progress Notes (Addendum)
[facility] Progress Note Patient Name: [name] DOB: Dec 20, 1945 MRN: [id number]   Date of Service  12/31/2021  HPI/Events of Note  Notified of gross hematuria.  Pt is on heparin gtt for atrial fibrillation.  HE had been straight cathed 3 times prior.  On foley cath insertion tonight, pt with frank blood clotting the catheter.   eICU Interventions  I have notified the urologist on call but was informed that the bedside can insert the catheter for CBI. Bedside RN notified.  Hold heparin.      Intervention Category Intermediate Interventions: Bleeding - evaluation and treatment with blood products  Elsie Lincoln 12/31/2021, 7:52 PM

## 2021-12-31 NOTE — Progress Notes (Signed)
NAME:  [name], MRN:  [id number], DOB:  18-Jun-1946, LOS: 3 ADMISSION DATE:  12/28/2021, CONSULTATION DATE: 12/29/2018. REFERRING MD: ER physician, CHIEF COMPLAINT: Sepsis  History of Present Illness:  76 year old male who presents to the hospital several days general malaise, fever, abdominal pain more focused in the right side.  He is notable for admission for supratherapeutic INR which he takes Coumadin for atrial fibrillation.  CT scan of the abdomen demonstrated enlarged gallbladder consistent with gallbladder.Question thrombus versus unopacified blood within LEFT atrial appendage; can assess by echocardiography.Cholelithiasis with calculi, wall thickening and surrounding inflammatory changes highly suspicious for acute cholecystitis    Pertinent  Medical History   Past Medical History:  Diagnosis Date   Allergic rhinitis 08/08/2013   takes Loratadine daily    Aortic stenosis 09/08/2011   With mild aortic regurgitation, mean gradient 13 mmHg    Cataract    right but immature   Complication of anesthesia    stopped breathing - during shoulder surgery 2009-2010   Coronary artery disease 10/02/2009   Cardiac cath (October 2007): 20% left main, 60% mid LAD, 60-70% distal LAD, 30-40% first diagonal, 30% circumflex, 40% OM, 30-40% RCA    Degenerative joint disease of shoulder 08/03/2013   Bilateral, s/p right total shoulder arthroplasty 05/29/2011 and left shoulder arthroplasty 54/65/6812   Diastolic dysfunction 75/06/6999   Echo (04/04/2016): Grade I   Diverticulosis 10/07/2013   Seen on colonoscopy in 2007    Erectile dysfunction 05/07/2009   Essential hypertension 08/03/2013   had been on Lisinopril but stopped per MD   First degree AV block 03/14/2016   Gastric ulcer 10/07/2013   Seen on EGD 04/10/2006    Hemorrhoids 10/07/2013   Hyperlipidemia 10/02/2009   Paroxysmal atrial fibrillation ([place]) 07/14/2006   One episode, provoked by alcohol use disorder, briefly anticoagulated with  warfarin, then switched to aspirin alone   Sciatica associated with disorder of lumbar spine 08/03/2013   Anterolisthesis with right L 4-5 nerve root compression.  Treated with epidural injections and Physical Therapy.    Seborrheic keratosis 10/07/2013   Tubular adenoma of colon      Significant Hospital Events: Including procedures, antibiotic start and stop dates in addition to other pertinent events   12/28/2021 admitted for cholecystitis 7/2 intubated for rapid respiratory compromise 7/3 pressor requirement increase, volume overloaded  Interim History / Subjective:  Intubated, sedated. UOP stable. Cr worsening. Remains on stable pressors, hope to wean based on AM MAPs  Objective   Blood pressure 109/62, pulse 69, temperature 98.5 F (36.9 C), temperature source Axillary, resp. rate 18, height '5\' 6"'$  (1.676 m), weight 93.5 kg, SpO2 97 %. CVP:  [13 mmHg-38 mmHg] 18 mmHg  Vent Mode: PRVC FiO2 (%):  [40 %] 40 % Set Rate:  [18 bmp] 18 bmp Vt Set:  [510 mL] 510 mL PEEP:  [5 cmH20] 5 cmH20 Plateau Pressure:  [18 cmH20-23 cmH20] 22 cmH20   Intake/Output Summary (Last 24 hours) at 12/31/2021 1014 Last data filed at 12/31/2021 0900 Gross per 24 hour  Intake 1559.84 ml  Output 1205 ml  Net 354.84 ml    Filed Weights   12/29/21 0500 12/30/21 0500 12/31/21 0500  Weight: 86.8 kg 87.6 kg 93.5 kg    Examination:   Resolved Hospital Problem list     Assessment & Plan:  Severe sepsis and septic shock due to cholecystitis Drain placement 7/2, appreciate IR help Continue abx, follow fever and WBC curve Co-ox 58.5% 7/3 would expect higher if  pure septic TTE ok, volume overloaded, trial diuresis in case venous congestion contributing to shock  Acute renal insufficiency Mixed physiology from sepsis, ATN with hypotension, volume overload. Cr worsening.  --lasix 120 mg IV  Acute Hypoxemic respiratory failure: due to volume overload, severe sepsis with shock. --PRVC, VAP bundle, Stress  ulcer ppx --trial diuresis  Supratherapeutic INR: Resolved with holding coumadin  Chronic atrial fibrillation on Coumadin Resume heparin with renal insufficiency  Question thrombus versus unopacified blood within LEFT atrial appendage;  None seen on 2D TTE   Best Practice (right click and "Reselect all SmartList Selections" daily)   Diet/type: NPO until procedure DVT prophylaxis: on hold for procedure GI prophylaxis: PPI Lines: N/A Foley:  N/A Code Status:  full code Last date of multidisciplinary goals of care discussion [to be determined]  CRITICAL CARE Performed by: Lanier Clam   Total critical care time: 38 minutes  Critical care time was exclusive of separately billable procedures and treating other patients.  Critical care was necessary to treat or prevent imminent or life-threatening deterioration.  Critical care was time spent personally by me on the following activities: development of treatment plan with patient and/or surrogate as well as nursing, discussions with consultants, evaluation of patient's response to treatment, examination of patient, obtaining history from patient or surrogate, ordering and performing treatments and interventions, ordering and review of laboratory studies, ordering and review of radiographic studies, pulse oximetry and re-evaluation of patient's condition.  Lanier Clam, MD

## 2021-12-31 NOTE — Progress Notes (Signed)
Overnight patient needing I/O catheter again due to retention. Nightshift RN also reporting blood clot at insertion and removal of catheter with yellow urine in between. Dr. Silas Flood notified, again question trauma related to I/O cath. Will continue to monitor.

## 2021-12-31 NOTE — Progress Notes (Signed)
Per verbal order from MD foley was placed due to multiple attempts at I/O cath for retention. Upon insertion, urine return was bright red with clots. This RN allowed the foley to drain freely attempting to get all clots out. The foley was placed at 1850 and this RN was leaving the urine in the urometer to show nightshift RN the appearance of the urine, upon re-entering the room to show the nightshift RN the urine and attempting to empty urometer the urine inside was congealed into one large mass. [place] notified.

## 2021-12-31 NOTE — Progress Notes (Signed)
ANTICOAGULATION CONSULT NOTE - Initial Consult  Pharmacy Consult for IV heparin Indication: atrial fibrillation  No Known Allergies  Patient Measurements: Height: '5\' 6"'$  (167.6 cm) Weight: 93.5 kg (206 lb 2.1 oz) IBW/kg (Calculated) : 63.8 Heparin Dosing Weight: 81.8 kg (based on admission weight)  Vital Signs: Temp: 98.5 F (36.9 C) (07/04 0715) Temp Source: Axillary (07/04 0715) BP: 107/72 (07/04 0715) Pulse Rate: 69 (07/04 0715)  Labs: Recent Labs    12/28/21 0943 12/28/21 0959 12/28/21 1154 12/28/21 1638 12/29/21 0054 12/29/21 1830 12/29/21 2035 12/30/21 0350 12/31/21 0342 12/31/21 0513  HGB 14.2 13.9  --   --  11.6*   < > 9.5* 10.4*  --  10.4*  HCT 40.9 41.0  --   --  31.7*   < > 28.0* 29.3*  --  29.8*  PLT 198  --   --   --  132*  --   --  105*  --  97*  LABPROT 62.1*  --   --  29.3* 19.7*  --   --   --  21.7*  --   INR 7.3*  --   --  2.8* 1.7*  --   --   --  1.9*  --   CREATININE 3.08* 2.90*  --   --  2.30*  --   --  2.42*  --   --   TROPONINIHS 20*  --  16  --   --   --   --   --   --   --    < > = values in this interval not displayed.    Estimated Creatinine Clearance: 28.7 mL/min (A) (by C-G formula based on SCr of 2.42 mg/dL (H)).   Medical History: Past Medical History:  Diagnosis Date   Allergic rhinitis 08/08/2013   takes Loratadine daily    Aortic stenosis 09/08/2011   With mild aortic regurgitation, mean gradient 13 mmHg    Cataract    right but immature   Complication of anesthesia    stopped breathing - during shoulder surgery 2009-2010   Coronary artery disease 10/02/2009   Cardiac cath (October 2007): 20% left main, 60% mid LAD, 60-70% distal LAD, 30-40% first diagonal, 30% circumflex, 40% OM, 30-40% RCA    Degenerative joint disease of shoulder 08/03/2013   Bilateral, s/p right total shoulder arthroplasty 05/29/2011 and left shoulder arthroplasty 37/16/9678   Diastolic dysfunction 93/01/1016   Echo (04/04/2016): Grade I   Diverticulosis  10/07/2013   Seen on colonoscopy in 2007    Erectile dysfunction 05/07/2009   Essential hypertension 08/03/2013   had been on Lisinopril but stopped per MD   First degree AV block 03/14/2016   Gastric ulcer 10/07/2013   Seen on EGD 04/10/2006    Hemorrhoids 10/07/2013   Hyperlipidemia 10/02/2009   Paroxysmal atrial fibrillation ([place]) 07/14/2006   One episode, provoked by alcohol use disorder, briefly anticoagulated with warfarin, then switched to aspirin alone   Sciatica associated with disorder of lumbar spine 08/03/2013   Anterolisthesis with right L 4-5 nerve root compression.  Treated with epidural injections and Physical Therapy.    Seborrheic keratosis 10/07/2013   Tubular adenoma of colon     Medications:  Infusions:   sodium chloride Stopped (12/29/21 0030)   feeding supplement (OSMOLITE 1.5 CAL) 20 mL/hr at 12/31/21 0900   fentaNYL infusion INTRAVENOUS 150 mcg/hr (12/31/21 0900)   furosemide     norepinephrine (LEVOPHED) Adult infusion 11 mcg/min (12/31/21 1012)   piperacillin-tazobactam (ZOSYN)  IV  12.5 mL/hr at 12/31/21 0900   propofol (DIPRIVAN) infusion 10 mcg/kg/min (12/31/21 2111)   vasopressin 0.03 Units/min (12/31/21 0900)    Assessment: 76 yo M admitted with severe septic shock 2/2 cholecystitis.  Afib (rate controlled) PTA.  Went into afib with RVR. Starting IV heparin until patient stable then planning to transition back to warfarin.  On warfarin PTA-hold with concern for dark stools on admit INR 1.9 today Last AC note from 12/10/21: 5 mg (5 mg x 1) every Mon, Wed, Fri; 2.5 mg (5 mg x 0.5) all other days INR goal 2-3  Goal of Therapy:  Heparin level 0.3-0.7 units/ml  Monitor platelets by anticoagulation protocol: Yes   Plan:  Holding warfarin PTA Heparin drip at 1100 units/hr Heparin level at 1900 Daily heparin level and CBC ordered Monitor for signs/symptoms of bleed  Thank you for allowing pharmacy to be a part of this patient's care.  Donnald Garre,  PharmD Clinical Pharmacist  Please check AMION for all [place] numbers After 10:00 PM, call [place] [phone]

## 2021-12-31 NOTE — Progress Notes (Signed)
On assessment patient is not withdrawing to pain on lower extremities after bolus dose of fentanyl given for agitation. Patient still moving upper extremities and pupils are reactive. This RN decreased propofol to see if patient responds. Will continue to monitor and report off.

## 2021-12-31 NOTE — Progress Notes (Signed)
Patient retaining urine >472m on bladder scan. Per Dr. HSilas FloodI/O cath performed following retention protocol. When catheter removed clot noted at opening of urethra but urine returned was clear and yellow. Dr. HSilas Floodaware and stating trauma from I/O cath is the cause. Will continue to monitor and pass along to night shift RN.

## 2021-12-31 NOTE — Progress Notes (Signed)
ANTICOAGULATION CONSULT NOTE - Initial Consult  Pharmacy Consult for IV heparin Indication: atrial fibrillation  No Known Allergies  Patient Measurements: Height: '5\' 6"'$  (167.6 cm) Weight: 93.5 kg (206 lb 2.1 oz) IBW/kg (Calculated) : 63.8 Heparin Dosing Weight: 81.8 kg (based on admission weight)  Vital Signs: Temp: 98.7 F (37.1 C) (07/04 1500) Temp Source: Axillary (07/04 1500) BP: 99/63 (07/04 1815) Pulse Rate: 81 (07/04 1815)  Labs: Recent Labs    12/29/21 0054 12/29/21 1830 12/29/21 2035 12/30/21 0350 12/31/21 0342 12/31/21 0513 12/31/21 0830 12/31/21 1824  HGB 11.6*   < > 9.5* 10.4*  --  10.4*  --   --   HCT 31.7*   < > 28.0* 29.3*  --  29.8*  --   --   PLT 132*  --   --  105*  --  97*  --   --   LABPROT 19.7*  --   --   --  21.7*  --   --   --   INR 1.7*  --   --   --  1.9*  --   --   --   HEPARINUNFRC  --   --   --   --   --   --   --  0.14*  CREATININE 2.30*  --   --  2.42*  --   --  3.17*  --    < > = values in this interval not displayed.     Estimated Creatinine Clearance: 21.9 mL/min (A) (by C-G formula based on SCr of 3.17 mg/dL (H)).   Medical History: Past Medical History:  Diagnosis Date   Allergic rhinitis 08/08/2013   takes Loratadine daily    Aortic stenosis 09/08/2011   With mild aortic regurgitation, mean gradient 13 mmHg    Cataract    right but immature   Complication of anesthesia    stopped breathing - during shoulder surgery 2009-2010   Coronary artery disease 10/02/2009   Cardiac cath (October 2007): 20% left main, 60% mid LAD, 60-70% distal LAD, 30-40% first diagonal, 30% circumflex, 40% OM, 30-40% RCA    Degenerative joint disease of shoulder 08/03/2013   Bilateral, s/p right total shoulder arthroplasty 05/29/2011 and left shoulder arthroplasty 43/32/9518   Diastolic dysfunction 84/06/6604   Echo (04/04/2016): Grade I   Diverticulosis 10/07/2013   Seen on colonoscopy in 2007    Erectile dysfunction 05/07/2009   Essential hypertension  08/03/2013   had been on Lisinopril but stopped per MD   First degree AV block 03/14/2016   Gastric ulcer 10/07/2013   Seen on EGD 04/10/2006    Hemorrhoids 10/07/2013   Hyperlipidemia 10/02/2009   Paroxysmal atrial fibrillation ([place]) 07/14/2006   One episode, provoked by alcohol use disorder, briefly anticoagulated with warfarin, then switched to aspirin alone   Sciatica associated with disorder of lumbar spine 08/03/2013   Anterolisthesis with right L 4-5 nerve root compression.  Treated with epidural injections and Physical Therapy.    Seborrheic keratosis 10/07/2013   Tubular adenoma of colon     Medications:  Infusions:   sodium chloride Stopped (12/29/21 0030)   feeding supplement (OSMOLITE 1.5 CAL) 20 mL/hr at 12/31/21 1800   fentaNYL infusion INTRAVENOUS 150 mcg/hr (12/31/21 1800)   heparin 1,100 Units/hr (12/31/21 1800)   norepinephrine (LEVOPHED) Adult infusion 9 mcg/min (12/31/21 1800)   piperacillin-tazobactam (ZOSYN)  IV 12.5 mL/hr at 12/31/21 1800   propofol (DIPRIVAN) infusion 10 mcg/kg/min (12/31/21 1800)   vasopressin 0.03 Units/min (12/31/21 1800)  Assessment: 76 yo M admitted with severe septic shock 2/2 cholecystitis.  Afib (rate controlled) PTA.  Went into afib with RVR. Starting IV heparin until patient stable then planning to transition back to warfarin.  On warfarin PTA-hold with concern for dark stools on admit INR 1.9 today Last AC note from 12/10/21: 5 mg (5 mg x 1) every Mon, Wed, Fri; 2.5 mg (5 mg x 0.5) all other days INR goal 2-3  HL subtherapeutic at 0.14  Goal of Therapy:  Heparin level 0.3-0.7 units/ml  Monitor platelets by anticoagulation protocol: Yes   Plan:  Holding warfarin PTA Increase Heparin drip to 1300 units/hr Heparin level in 8 hours Daily heparin level and CBC ordered Monitor for signs/symptoms of bleed  Thank you for allowing pharmacy to be a part of this patient's care.  Alanda Slim, PharmD, [facility] Clinical Pharmacist Please  see AMION for all Pharmacists' Contact Phone Numbers 12/31/2021, 7:05 PM

## 2021-12-31 NOTE — Progress Notes (Addendum)
Subjective: CC: Intubated and sedated (fentanyl, propofol)  Afebrile Still on Levo (28mg/min - down from yesterday) and Vasopressin  On TF's without vomiting. 4 BMs yesterday.   Objective: Vital signs in last 24 hours: Temp:  [97.9 F (36.6 C)-99 F (37.2 C)] 98.5 F (36.9 C) (07/04 0715) Pulse Rate:  [68-88] 69 (07/04 0915) Resp:  [12-26] 18 (07/04 0915) BP: (74-120)/(45-83) 109/62 (07/04 0915) SpO2:  [89 %-100 %] 97 % (07/04 0915) FiO2 (%):  [40 %] 40 % (07/04 0900) Weight:  [93.5 kg] 93.5 kg (07/04 0500) Last BM Date : 12/30/21  Intake/Output from previous day: 07/03 0701 - 07/04 0700 In: 1827.8 [I.V.:1183.6; NG/GT:420.7; IV Piggyback:218.6] Out: 1205 [Urine:900; Drains:305] Intake/Output this shift: Total I/O In: 146.9 [I.V.:82; NG/GT:40; IV Piggyback:24.9] Out: -   Vent Mode: PRVC FiO2 (%):  [40 %] 40 % Set Rate:  [18 bmp] 18 bmp Vt Set:  [510 mL] 510 mL PEEP:  [5 cmH20] 5 cmH20 Plateau Pressure:  [18 cmH20-23 cmH20] 22 cmH20   PE: Gen:  intubated, sedated Card:  Reg rate Pulm: intubated with vent settings as above Abd: Obese, no rigidity or gurading, perc chole tube to gravity in RUQ w/ bilious output  Lab Results:  Recent Labs    12/30/21 0350 12/31/21 0513  WBC 13.4* 12.6*  HGB 10.4* 10.4*  HCT 29.3* 29.8*  PLT 105* 97*   BMET Recent Labs    12/30/21 0350 12/31/21 0830  NA 137 140  K 3.5 3.8  CL 101 103  CO2 22 23  GLUCOSE 124* 141*  BUN 48* 61*  CREATININE 2.42* 3.17*  CALCIUM 8.4* 8.6*   PT/INR Recent Labs    12/29/21 0054 12/31/21 0342  LABPROT 19.7* 21.7*  INR 1.7* 1.9*   CMP     Component Value Date/Time   NA 140 12/31/2021 0830   NA 142 10/10/2021 0905   K 3.8 12/31/2021 0830   CL 103 12/31/2021 0830   CO2 23 12/31/2021 0830   GLUCOSE 141 (H) 12/31/2021 0830   BUN 61 (H) 12/31/2021 0830   BUN 34 (H) 10/10/2021 0905   CREATININE 3.17 (H) 12/31/2021 0830   CREATININE 1.01 11/13/2014 0846   CALCIUM 8.6 (L)  12/31/2021 0830   PROT 7.0 12/28/2021 0943   PROT 6.9 09/08/2019 0936   ALBUMIN 3.0 (L) 12/28/2021 0943   ALBUMIN 4.2 09/08/2019 0936   AST 28 12/28/2021 0943   ALT 13 12/28/2021 0943   ALKPHOS 67 12/28/2021 0943   BILITOT 2.3 (H) 12/28/2021 0943   BILITOT 0.4 09/08/2019 0936   GFRNONAA 20 (L) 12/31/2021 0830   GFRNONAA 77 11/13/2014 0846   GFRAA 68 03/12/2020 0904   GFRAA 89 11/13/2014 0846   Lipase     Component Value Date/Time   LIPASE 25 12/28/2021 0943    Studies/Results: DG Chest Port 1 View  Result Date: 12/30/2021 CLINICAL DATA:  Endotracheal tube EXAM: PORTABLE CHEST 1 VIEW COMPARISON:  Radiograph 12/29/2021 FINDINGS: Endotracheal tube tip overlies the distal trachea approximately 1.4 cm above the carina. Left neck catheter tip overlies the superior cavoatrial junction. Orogastric tube passes below the diaphragm, tip excluded by collimation. Unchanged enlarged cardiomediastinal silhouette. Low lung volumes. Increased right basilar atelectasis. Stable trace pleural effusions. No pneumothorax. Unchanged bilateral shoulder arthroplasties. IMPRESSION: Endotracheal tube tip overlies the distal trachea approximately 1.4 cm by the carina, would consider retraction by 1.0 cm. Increasing right basilar atelectasis. Stable probable trace pleural effusions. Electronically Signed   By: JEdison Nasuti  [name] M.D.   On: 12/30/2021 08:07   DG CHEST PORT 1 VIEW  Result Date: 12/29/2021 CLINICAL DATA:  Central line placement. EXAM: PORTABLE CHEST 1 VIEW COMPARISON:  Radiograph 45 minutes ago, CT yesterday FINDINGS: New left internal jugular central venous catheter tip overlies the atrial caval junction. No pneumothorax. Endotracheal tube tip just below the clavicular heads. Tip and side port of the enteric tube below the diaphragm in the stomach. Persistent low lung volumes. Stable heart size and mediastinal contours, bibasilar atelectasis. Possible developing pleural effusions. Cholecystostomy tube in the  right upper quadrant. Dense aortic atherosclerosis. IMPRESSION: 1. New left internal jugular central venous catheter with tip overlying the atrial caval junction. No pneumothorax. 2. Persistent low lung volumes with bibasilar atelectasis and possible developing pleural effusions. Electronically Signed   By: Keith Rake M.D.   On: 12/29/2021 19:57   DG CHEST PORT 1 VIEW  Result Date: 12/29/2021 CLINICAL DATA:  [id number]; enteric tube placement EXAM: PORTABLE CHEST 1 VIEW; portable abdomen one view COMPARISON:  CT dated December 28, 2021 FINDINGS: The cardiomediastinal silhouette is unchanged in contour.ETT tip terminates 2.5 Cm above the carina. Small LEFT pleural effusion. Bibasilar opacities likely atelectasis. No pneumothorax. Atherosclerotic calcifications throughout the aorta. Enteric tube tip and side port project over the distal stomach. Percutaneous cholecystectomy tube projects over the RIGHT abdomen. New gaseous dilation of multiple loops of small bowel, likely ileus given recent surgical intervention. Status post bilateral shoulder arthroplasty. IMPRESSION: 1. Enteric tube tip and side port project over the distal stomach. 2. ETT tip terminates between the thoracic inlet and the carina. 3. New gaseous dilation of loops of small bowel. This likely reflect ileus given recent percutaneous cholecystectomy tube placement. If persistent concern for obstruction, recommend dedicated CT or serial KUBs. 4. Small LEFT pleural effusion. Electronically Signed   By: Valentino Saxon M.D.   On: 12/29/2021 19:10   DG Abd Portable 1V  Result Date: 12/29/2021 CLINICAL DATA:  [id number]; enteric tube placement EXAM: PORTABLE CHEST 1 VIEW; portable abdomen one view COMPARISON:  CT dated December 28, 2021 FINDINGS: The cardiomediastinal silhouette is unchanged in contour.ETT tip terminates 2.5 Cm above the carina. Small LEFT pleural effusion. Bibasilar opacities likely atelectasis. No pneumothorax. Atherosclerotic  calcifications throughout the aorta. Enteric tube tip and side port project over the distal stomach. Percutaneous cholecystectomy tube projects over the RIGHT abdomen. New gaseous dilation of multiple loops of small bowel, likely ileus given recent surgical intervention. Status post bilateral shoulder arthroplasty. IMPRESSION: 1. Enteric tube tip and side port project over the distal stomach. 2. ETT tip terminates between the thoracic inlet and the carina. 3. New gaseous dilation of loops of small bowel. This likely reflect ileus given recent percutaneous cholecystectomy tube placement. If persistent concern for obstruction, recommend dedicated CT or serial KUBs. 4. Small LEFT pleural effusion. Electronically Signed   By: Valentino Saxon M.D.   On: 12/29/2021 19:10   ECHOCARDIOGRAM COMPLETE  Result Date: 12/29/2021    ECHOCARDIOGRAM REPORT   Patient Name:   [name] Date of Exam: 12/29/2021 Medical Rec #:  [id number]         Height:       66.0 in Accession #:    [id number]        Weight:       191.4 lb Date of Birth:  [birth year]         BSA:          1.963 m Patient Age:  76 years          BP:           107/67 mmHg Patient Gender: M                 HR:           106 bpm. Exam Location:  Inpatient Procedure: 2D Echo Indications:    Atrial fibrillation  History:        Patient has prior history of Echocardiogram examinations, most                 recent 08/29/2021. CAD, Aortic Valve Disease, Arrythmias:Atrial                 Fibrillation, Signs/Symptoms:Murmur; Risk Factors:Hypertension                 and Dyslipidemia.  Sonographer:    [name] RDCS Referring Phys: [place]  1. Left ventricular ejection fraction, by estimation, is 60 to 65%. The left ventricle has normal function. The left ventricle has no regional wall motion abnormalities. Left ventricular diastolic parameters are indeterminate.  2. Right ventricular systolic function is normal. The right ventricular  size is normal.  3. Left atrial size was moderately dilated.  4. The mitral valve is normal in structure. Mild mitral valve regurgitation. No evidence of mitral stenosis.  5. The aortic valve is calcified. Aortic valve regurgitation is mild. Moderate aortic valve stenosis. Comparison(s): No significant change from prior study. FINDINGS  Left Ventricle: Left ventricular ejection fraction, by estimation, is 60 to 65%. The left ventricle has normal function. The left ventricle has no regional wall motion abnormalities. The left ventricular internal cavity size was normal in size. There is  no left ventricular hypertrophy. Left ventricular diastolic parameters are indeterminate. Right Ventricle: The right ventricular size is normal. Right ventricular systolic function is normal. Left Atrium: Left atrial size was moderately dilated. Right Atrium: Right atrial size was normal in size. Pericardium: There is no evidence of pericardial effusion. Mitral Valve: The mitral valve is normal in structure. Mild mitral annular calcification. Mild mitral valve regurgitation. No evidence of mitral valve stenosis. Tricuspid Valve: The tricuspid valve is normal in structure. Tricuspid valve regurgitation is mild . No evidence of tricuspid stenosis. Aortic Valve: The aortic valve is calcified. Aortic valve regurgitation is mild. Moderate aortic stenosis is present. Aortic valve mean gradient measures 17.3 mmHg. Aortic valve peak gradient measures 30.6 mmHg. Aortic valve area, by VTI measures 1.11 cm. Pulmonic Valve: The pulmonic valve was normal in structure. Pulmonic valve regurgitation is trivial. No evidence of pulmonic stenosis. Aorta: The aortic root is normal in size and structure. Venous: The inferior vena cava was not well visualized. IAS/Shunts: The interatrial septum was not well visualized.  LEFT VENTRICLE PLAX 2D LVIDd:         3.90 cm LVIDs:         2.70 cm LV PW:         1.10 cm LV IVS:        1.00 cm LVOT diam:     2.20  cm LV SV:         51 LV SV Index:   26 LVOT Area:     3.80 cm  RIGHT VENTRICLE RV S prime:     10.60 cm/s TAPSE (M-mode): 1.1 cm LEFT ATRIUM           Index        RIGHT ATRIUM  Index LA diam:      5.10 cm 2.60 cm/m   RA Area:     16.50 cm LA Vol (A4C): 80.6 ml 41.07 ml/m  RA Volume:   39.10 ml  19.92 ml/m  AORTIC VALVE AV Area (Vmax):    1.05 cm AV Area (Vmean):   1.00 cm AV Area (VTI):     1.11 cm AV Vmax:           276.67 cm/s AV Vmean:          195.000 cm/s AV VTI:            0.455 m AV Peak Grad:      30.6 mmHg AV Mean Grad:      17.3 mmHg LVOT Vmax:         76.13 cm/s LVOT Vmean:        51.433 cm/s LVOT VTI:          0.133 m LVOT/AV VTI ratio: 0.29  AORTA Ao Root diam: 3.30 cm Ao Asc diam:  3.30 cm TRICUSPID VALVE TR Peak grad:   43.6 mmHg TR Vmax:        330.00 cm/s  SHUNTS Systemic VTI:  0.13 m Systemic Diam: 2.20 cm Kirk Ruths MD Electronically signed by Kirk Ruths MD Signature Date/Time: 12/29/2021/2:08:02 PM    Final    DG Chest Port 1 View  Result Date: 12/29/2021 CLINICAL DATA:  Pulmonary edema EXAM: PORTABLE CHEST 1 VIEW COMPARISON:  Portable exam 1201 hours compared to 08/04/2013 FINDINGS: Enlargement of cardiac silhouette. Atherosclerotic calcification aorta. Decreased lung volumes with bibasilar atelectasis and central peribronchial thickening. No definite infiltrate, pleural effusion, or pneumothorax. Osseous demineralization with BILATERAL shoulder prostheses. IMPRESSION: Enlargement of cardiac silhouette with bibasilar atelectasis. Aortic Atherosclerosis (ICD10-I70.0). Electronically Signed   By: Lavonia Dana M.D.   On: 12/29/2021 12:15    Anti-infectives: Anti-infectives (From admission, onward)    Start     Dose/Rate Route Frequency Ordered Stop   12/28/21 1600  piperacillin-tazobactam (ZOSYN) IVPB 3.375 g        3.375 g 12.5 mL/hr over 240 Minutes Intravenous Every 8 hours 12/28/21 1419     12/28/21 1600  cefOXitin (MEFOXIN) 2 g in sodium chloride 0.9 % 100 mL  IVPB  Status:  Discontinued        2 g 200 mL/hr over 30 Minutes Intravenous To Radiology 12/28/21 1509 12/29/21 1437   12/28/21 1256  vancomycin variable dose per unstable renal function (pharmacist dosing)  Status:  Discontinued         Does not apply See admin instructions 12/28/21 1256 12/28/21 1419   12/28/21 1015  piperacillin-tazobactam (ZOSYN) IVPB 3.375 g        3.375 g 100 mL/hr over 30 Minutes Intravenous  Once 12/28/21 1001 12/28/21 1029   12/28/21 1015  vancomycin (VANCOREADY) IVPB 1750 mg/350 mL        1,750 mg 175 mL/hr over 120 Minutes Intravenous  Once 12/28/21 1003 12/28/21 1254        Assessment/Plan Acute cholecystitis - S/p percutaneous cholecystostomy tube 7/2, 10 Fr, Cx w/ E. Coli. Can narrow based on susceptibility report. WBC improving - Can narrow abx when  - will need outpatient follow up with [facility] to discuss interval cholecystectomy once he recovers   FEN: NPO, IVF; ok for tube feeds from [facility] standpoint ID: Zosyn 7/1 >>  VTE: SCDs, heparin gtt Foley: ext, per primary Dispo: ICU    Below per CCM-- Septic shock due to above  Acute respiratory failure Hx A. Fib on Coumadin with supratherapeutic INR - improving, 1.9 AKI    LOS: 3 days    Jillyn Ledger , [facility] Surgery 12/31/2021, 10:38 AM Please see Amion for pager number during day hours 7:00am-4:30pm

## 2022-01-01 DIAGNOSIS — K810 Acute cholecystitis: Secondary | ICD-10-CM | POA: Diagnosis not present

## 2022-01-01 LAB — GLUCOSE, CAPILLARY
Glucose-Capillary: 154 mg/dL — ABNORMAL HIGH (ref 70–99)
Glucose-Capillary: 183 mg/dL — ABNORMAL HIGH (ref 70–99)
Glucose-Capillary: 148 mg/dL — ABNORMAL HIGH (ref 70–99)
Glucose-Capillary: 170 mg/dL — ABNORMAL HIGH (ref 70–99)
Glucose-Capillary: 181 mg/dL — ABNORMAL HIGH (ref 70–99)
Glucose-Capillary: 192 mg/dL — ABNORMAL HIGH (ref 70–99)

## 2022-01-01 LAB — CBC
HCT: 27.9 % — ABNORMAL LOW (ref 39.0–52.0)
Hemoglobin: 9.7 g/dL — ABNORMAL LOW (ref 13.0–17.0)
MCH: 32.7 pg (ref 26.0–34.0)
MCHC: 34.8 g/dL (ref 30.0–36.0)
MCV: 93.9 fL (ref 80.0–100.0)
Platelets: 107 10*3/uL — ABNORMAL LOW (ref 150–400)
RBC: 2.97 MIL/uL — ABNORMAL LOW (ref 4.22–5.81)
RDW: 14.6 % (ref 11.5–15.5)
WBC: 14.6 10*3/uL — ABNORMAL HIGH (ref 4.0–10.5)
nRBC: 0 % (ref 0.0–0.2)

## 2022-01-01 LAB — TYPE AND SCREEN
ABO/RH(D): B NEG
Antibody Screen: NEGATIVE

## 2022-01-01 LAB — PHOSPHORUS: Phosphorus: 3.5 mg/dL (ref 2.5–4.6)

## 2022-01-01 LAB — MAGNESIUM: Magnesium: 2.6 mg/dL — ABNORMAL HIGH (ref 1.7–2.4)

## 2022-01-01 LAB — CORTISOL: Cortisol, Plasma: 10.8 ug/dL

## 2022-01-01 LAB — HEPARIN LEVEL (UNFRACTIONATED): Heparin Unfractionated: <0.1 IU/mL — ABNORMAL LOW (ref 0.30–0.70)

## 2022-01-01 MED ORDER — POLYETHYLENE GLYCOL 3350 17 G PO PACK
17.0000 g | PACK | Freq: Every day | ORAL | Status: DC
  Administered 2022-01-01 – 2022-01-02 (×2): 17 g
  Filled 2022-01-01 (×2): qty 1

## 2022-01-01 MED ORDER — SODIUM CHLORIDE 0.9 % IV SOLN
2.0000 g | INTRAVENOUS | Status: DC
  Administered 2022-01-01 – 2022-01-02 (×2): 2 g via INTRAVENOUS
  Filled 2022-01-01 (×2): qty 20

## 2022-01-01 MED ORDER — SODIUM CHLORIDE 0.9 % IV SOLN: INTRAVENOUS | Status: DC | PRN

## 2022-01-01 MED ORDER — FENTANYL BOLUS VIA INFUSION
100.0000 ug | INTRAVENOUS | Status: DC | PRN
  Administered 2022-01-01 – 2022-01-03 (×2): 100 ug via INTRAVENOUS
  Administered 2022-01-04: 50 ug via INTRAVENOUS
  Administered 2022-01-04 – 2022-01-09 (×23): 100 ug via INTRAVENOUS

## 2022-01-01 MED ORDER — SENNOSIDES-DOCUSATE SODIUM 8.6-50 MG PO TABS
1.0000 | ORAL_TABLET | Freq: Every day | ORAL | Status: DC
  Administered 2022-01-01 – 2022-01-08 (×4): 1
  Filled 2022-01-01 (×4): qty 1

## 2022-01-01 MED ORDER — METHYLPREDNISOLONE SODIUM SUCC 40 MG IJ SOLR
40.0000 mg | Freq: Three times a day (TID) | INTRAMUSCULAR | Status: DC
  Administered 2022-01-01 – 2022-01-03 (×6): 40 mg via INTRAVENOUS
  Filled 2022-01-01 (×6): qty 1

## 2022-01-01 NOTE — Progress Notes (Signed)
Per MD order, exchanged current foley to 3-way foley. Prior to insertion, peri care was performed and sterile field maintained during insertion by this RN and Allegra Grana, RN.  Continuous bladder irrigation initiated per MD order. Frank red urine with multiple clots returned into foley bag. Elink aware.

## 2022-01-01 NOTE — Progress Notes (Signed)
NAME:  [name], MRN:  [id number], DOB:  1945-10-12, LOS: 4 ADMISSION DATE:  12/28/2021, CONSULTATION DATE: 12/29/2018. REFERRING MD: ER physician, CHIEF COMPLAINT: Sepsis  History of Present Illness:  76 year old male who presents to the hospital several days general malaise, fever, abdominal pain more focused in the right side.  He is notable for admission for supratherapeutic INR which he takes Coumadin for atrial fibrillation.  CT scan of the abdomen demonstrated enlarged gallbladder consistent with gallbladder.Question thrombus versus unopacified blood within LEFT atrial appendage; can assess by echocardiography.Cholelithiasis with calculi, wall thickening and surrounding inflammatory changes highly suspicious for acute cholecystitis    Pertinent  Medical History   Past Medical History:  Diagnosis Date   Allergic rhinitis 08/08/2013   takes Loratadine daily    Aortic stenosis 09/08/2011   With mild aortic regurgitation, mean gradient 13 mmHg    Cataract    right but immature   Complication of anesthesia    stopped breathing - during shoulder surgery 2009-2010   Coronary artery disease 10/02/2009   Cardiac cath (October 2007): 20% left main, 60% mid LAD, 60-70% distal LAD, 30-40% first diagonal, 30% circumflex, 40% OM, 30-40% RCA    Degenerative joint disease of shoulder 08/03/2013   Bilateral, s/p right total shoulder arthroplasty 05/29/2011 and left shoulder arthroplasty 32/67/1245   Diastolic dysfunction 80/02/9832   Echo (04/04/2016): Grade I   Diverticulosis 10/07/2013   Seen on colonoscopy in 2007    Erectile dysfunction 05/07/2009   Essential hypertension 08/03/2013   had been on Lisinopril but stopped per MD   First degree AV block 03/14/2016   Gastric ulcer 10/07/2013   Seen on EGD 04/10/2006    Hemorrhoids 10/07/2013   Hyperlipidemia 10/02/2009   Paroxysmal atrial fibrillation ([place]) 07/14/2006   One episode, provoked by alcohol use disorder, briefly anticoagulated with  warfarin, then switched to aspirin alone   Sciatica associated with disorder of lumbar spine 08/03/2013   Anterolisthesis with right L 4-5 nerve root compression.  Treated with epidural injections and Physical Therapy.    Seborrheic keratosis 10/07/2013   Tubular adenoma of colon      Significant Hospital Events: Including procedures, antibiotic start and stop dates in addition to other pertinent events   12/28/2021 admitted for cholecystitis 7/2 intubated for rapid respiratory compromise 7/3 pressor requirement increase, volume overloaded 7/4 largely unchanged compared to day prior, creatinine increasing, trial of Lasix  Interim History / Subjective:  Intubated, sedated. Cr worse. Not great response to 120 mg IV lasix. Developed hematuria after traumatic I/o catheterization.   Objective   Blood pressure (!) 98/53, pulse 78, temperature 98 F (36.7 C), temperature source Axillary, resp. rate 18, height '5\' 6"'$  (1.676 m), weight 93.5 kg, SpO2 97 %. CVP:  [12 mmHg-15 mmHg] 12 mmHg  Vent Mode: PRVC FiO2 (%):  [40 %] 40 % Set Rate:  [18 bmp] 18 bmp Vt Set:  [510 mL] 510 mL PEEP:  [5 cmH20] 5 cmH20 Pressure Support:  [15 cmH20] 15 cmH20 Plateau Pressure:  [17 cmH20-20 cmH20] 17 cmH20   Intake/Output Summary (Last 24 hours) at 01/01/2022 1228 Last data filed at 01/01/2022 1149 Gross per 24 hour  Intake 34719.8 ml  Output 42650 ml  Net -7930.2 ml    Filed Weights   12/29/21 0500 12/30/21 0500 12/31/21 0500  Weight: 86.8 kg 87.6 kg 93.5 kg    Examination: General: Lying in bed, very ill-appearing Eyes: Pupils equal round reactive, no icterus Lungs: Coarse ventilated sounds, tolerating pressure support  Extremities: Mild edema Abdomen: Nondistended, bowel sounds present, soft Cardiovascular: Irregular rhythm, regular rate  Resolved Hospital Problem list     Assessment & Plan:  Severe sepsis and septic shock due to cholecystitis Drain placement 7/2, appreciate IR, surgery  assistance Continue abx, follow fever and WBC curve Co-ox 58.5% 7/3 would expect higher if pure septic TTE ok, volume overloaded, trial diuresis 7/4 without robust response although unclear as he is listed -7 L, however do wonder if this is inaccurate in the setting of CBI and inaccurate documentation Stress dose steroids added 7/5 with cortisol 10  Acute renal failure Mixed physiology from sepsis, ATN with hypotension, volume overload. Cr worsening, did not respond well to 120 mg IV lasix 7/4.  --see IPAL note 7/5, he would never want dialysis  Acute Hypoxemic respiratory failure: due to volume overload, severe sepsis with shock. --PRVC, VAP bundle, Stress ulcer ppx --PSV during day  Hematuria: description of blood clot at end of I/O catheter x2 without hematuria with subsequent development of significant gross hematuria. --heparin stopped, continue CBI  Supratherapeutic INR: Resolved with holding coumadin  Chronic atrial fibrillation on Coumadin --d/c heparin 7/5 after hematuria  Question thrombus versus unopacified blood within LEFT atrial appendage;  None seen on 2D TTE   Best Practice (right click and "Reselect all SmartList Selections" daily)   Diet/type: NPO  DVT prophylaxis: hold with hematuria GI prophylaxis: PPI Lines: N/A Foley:  N/A Code Status:  DNR Last date of multidisciplinary goals of care discussion [See IPAL 7/5]  CRITICAL CARE Performed by: Bonna Gains Amberlea Spagnuolo   Total critical care time: 42 minutes  Critical care time was exclusive of separately billable procedures and treating other patients.  Critical care was necessary to treat or prevent imminent or life-threatening deterioration.  Critical care was time spent personally by me on the following activities: development of treatment plan with patient and/or surrogate as well as nursing, discussions with consultants, evaluation of patient's response to treatment, examination of patient, obtaining  history from patient or surrogate, ordering and performing treatments and interventions, ordering and review of laboratory studies, ordering and review of radiographic studies, pulse oximetry and re-evaluation of patient's condition.  Lanier Clam, MD

## 2022-01-01 NOTE — Progress Notes (Signed)
Trauma/Critical Care Follow Up Note  Subjective:    Overnight Issues:   Objective:  Vital signs for last 24 hours: Temp:  [97.7 F (36.5 C)-98.7 F (37.1 C)] 97.7 F (36.5 C) (07/05 0820) Pulse Rate:  [61-103] 78 (07/05 0815) Resp:  [14-23] 18 (07/05 0815) BP: (80-148)/(48-107) 99/61 (07/05 0815) SpO2:  [88 %-99 %] 98 % (07/05 0815) FiO2 (%):  [40 %] 40 % (07/05 0756)  Hemodynamic parameters for last 24 hours: CVP:  [12 mmHg-24 mmHg] 12 mmHg  Intake/Output from previous day: 07/04 0701 - 07/05 0700 In: 19815.7 [I.V.:1148.5; NG/GT:460; IV Piggyback:207.2] Out: 03474 [Urine:25175; Drains:275]  Intake/Output this shift: Total I/O In: 3000 [Other:3000] Out: 4750 [Urine:4750]  Vent settings for last 24 hours: Vent Mode: PRVC FiO2 (%):  [40 %] 40 % Set Rate:  [18 bmp] 18 bmp Vt Set:  [510 mL] 510 mL PEEP:  [5 cmH20] 5 cmH20 Pressure Support:  [15 cmH20] 15 cmH20 Plateau Pressure:  [19 cmH20-22 cmH20] 19 cmH20  Physical Exam:  Gen: comfortable, no distress Neuro: sedated HEENT: PERRL Neck: supple CV: RRR Pulm: unlabored breathing Abd: soft, NT, cholecystostomy with bilious drainage-275/24h GU: bloody urine, murphy drip Extr: wwp, no edema   Results for orders placed or performed during the hospital encounter of 12/28/21 (from the past 24 hour(s))  Basic metabolic panel     Status: Abnormal   Collection Time: 12/31/21  8:30 AM  Result Value Ref Range   Sodium 140 135 - 145 mmol/L   Potassium 3.8 3.5 - 5.1 mmol/L   Chloride 103 98 - 111 mmol/L   CO2 23 22 - 32 mmol/L   Glucose, Bld 141 (H) 70 - 99 mg/dL   BUN 61 (H) 8 - 23 mg/dL   Creatinine, Ser 3.17 (H) 0.61 - 1.24 mg/dL   Calcium 8.6 (L) 8.9 - 10.3 mg/dL   GFR, Estimated 20 (L) >60 mL/min   Anion gap 14 5 - 15  Glucose, capillary     Status: Abnormal   Collection Time: 12/31/21 11:21 AM  Result Value Ref Range   Glucose-Capillary 162 (H) 70 - 99 mg/dL   Comment 1 Notify RN    Comment 2 Document in  Chart   Glucose, capillary     Status: Abnormal   Collection Time: 12/31/21  3:29 PM  Result Value Ref Range   Glucose-Capillary 144 (H) 70 - 99 mg/dL   Comment 1 Notify RN    Comment 2 Document in Chart   Magnesium     Status: Abnormal   Collection Time: 12/31/21  6:24 PM  Result Value Ref Range   Magnesium 2.7 (H) 1.7 - 2.4 mg/dL  Phosphorus     Status: None   Collection Time: 12/31/21  6:24 PM  Result Value Ref Range   Phosphorus 3.6 2.5 - 4.6 mg/dL  Heparin level (unfractionated)     Status: Abnormal   Collection Time: 12/31/21  6:24 PM  Result Value Ref Range   Heparin Unfractionated 0.14 (L) 0.30 - 0.70 IU/mL  Glucose, capillary     Status: Abnormal   Collection Time: 12/31/21  7:55 PM  Result Value Ref Range   Glucose-Capillary 141 (H) 70 - 99 mg/dL  CBC     Status: Abnormal   Collection Time: 12/31/21 10:09 PM  Result Value Ref Range   WBC 13.7 (H) 4.0 - 10.5 K/uL   RBC 2.99 (L) 4.22 - 5.81 MIL/uL   Hemoglobin 9.7 (L) 13.0 - 17.0 g/dL   HCT  28.0 (L) 39.0 - 52.0 %   MCV 93.6 80.0 - 100.0 fL   MCH 32.4 26.0 - 34.0 pg   MCHC 34.6 30.0 - 36.0 g/dL   RDW 14.5 11.5 - 15.5 %   Platelets 101 (L) 150 - 400 K/uL   nRBC 0.0 0.0 - 0.2 %  Glucose, capillary     Status: Abnormal   Collection Time: 12/31/21 11:19 PM  Result Value Ref Range   Glucose-Capillary 141 (H) 70 - 99 mg/dL  Magnesium     Status: Abnormal   Collection Time: 01/01/22  2:52 AM  Result Value Ref Range   Magnesium 2.6 (H) 1.7 - 2.4 mg/dL  Phosphorus     Status: None   Collection Time: 01/01/22  2:52 AM  Result Value Ref Range   Phosphorus 3.5 2.5 - 4.6 mg/dL  Heparin level (unfractionated)     Status: Abnormal   Collection Time: 01/01/22  2:52 AM  Result Value Ref Range   Heparin Unfractionated <0.10 (L) 0.30 - 0.70 IU/mL  CBC     Status: Abnormal   Collection Time: 01/01/22  2:52 AM  Result Value Ref Range   WBC 14.6 (H) 4.0 - 10.5 K/uL   RBC 2.97 (L) 4.22 - 5.81 MIL/uL   Hemoglobin 9.7 (L) 13.0  - 17.0 g/dL   HCT 27.9 (L) 39.0 - 52.0 %   MCV 93.9 80.0 - 100.0 fL   MCH 32.7 26.0 - 34.0 pg   MCHC 34.8 30.0 - 36.0 g/dL   RDW 14.6 11.5 - 15.5 %   Platelets 107 (L) 150 - 400 K/uL   nRBC 0.0 0.0 - 0.2 %  Type and screen [place]     Status: None   Collection Time: 01/01/22  2:57 AM  Result Value Ref Range   ABO/RH(D) B NEG    Antibody Screen NEG    Sample Expiration      01/04/2022,2359 Performed at [facility], [place] [address] [place], [place] [address]   Glucose, capillary     Status: Abnormal   Collection Time: 01/01/22  2:57 AM  Result Value Ref Range   Glucose-Capillary 154 (H) 70 - 99 mg/dL  Glucose, capillary     Status: Abnormal   Collection Time: 01/01/22  8:17 AM  Result Value Ref Range   Glucose-Capillary 183 (H) 70 - 99 mg/dL    Assessment & Plan: The plan of care was discussed with the bedside nurse for the day, who is in agreement with this plan and no additional concerns were raised.   Present on Admission:  Acute cholecystitis    LOS: 4 days   Additional comments:I reviewed the patient's new clinical lab test results.   and I reviewed the patients new imaging test results.    Acute cholecystitis - S/p percutaneous cholecystostomy tube 7/2, 10 Fr, Cx w/ pan-sensitive E. Coli. Can narrow abx from surgical standpoint, but patient may have an indication for broader coverage from primary team standpoint.  - will need outpatient follow up with [facility] to discuss interval cholecystectomy once he recovers   FEN: NPO, IVF; recommend holding TF while on multiple pressors. Okay for trickle TF if on single agent vasopressin or single agent levophed and less than 10. ID: Zosyn 7/1 >>  VTE: SCDs, heparin gtt Foley: per primary Dispo: ICU    Below per CCM-- Septic shock due to above - recommend art line placement, avoidance of hypotension-inducing sedative agents, consideration of cortisol stim vs  empiric stress dose steroids Acute  respiratory failure  Hx A. Fib on Coumadin with supratherapeutic INR - on heparin drip, agree with continuing despite hematuria as hgb is stable Hematuria - recommend holding Murphy drip to send UA/cx, then restarting to r/o UTI as etiology for hematuria.  AKI - CRRT   Jesusita Oka, MD Trauma & General Surgery Please use [email or web address] to contact on call provider  01/01/2022  *Care during the described time interval was provided by me. I have reviewed this patient's available data, including medical history, events of note, physical examination and test results as part of my evaluation.

## 2022-01-01 NOTE — Procedures (Signed)
Arterial Line Insertion Start/End7/10/2021 11:00 AM, 01/01/2022 11:30 AM  Patient location: ICU. Preanesthetic checklist: patient identified, risks and benefits discussed, surgical consent, monitors and equipment checked and timeout performed Right, radial was placed Catheter size: 20 G Hand hygiene performed  and maximum sterile barriers used  Allen's test indicative of satisfactory collateral circulation Attempts: 3 Procedure performed without using ultrasound guided technique. Following insertion, dressing applied and Biopatch. Post procedure assessment: unchanged  Patient tolerated the procedure well with no immediate complications. Additional procedure comments: RT inserted aline with second RRT at bedside per MD order. Marland Kitchen

## 2022-01-01 NOTE — IPAL (Signed)
  Interdisciplinary Goals of Care Family Meeting   Date carried out: 01/01/2022  Location of the meeting: Unit  Member's involved: Physician and Family Member or next of kin  Durable Power of Attorney or acting medical decision maker: yes - wife    Discussion: We discussed goals of care for [employer] .  Persistent hypotension.  Worsening renal function.  Remains on ventilator.  Life support.  Despite broad-spectrum antibiotics and best efforts, no improvement.  Likely worsened.  Multisystem organ failure.  Discussed at length with likely looking at a prolonged ICU stay, prolonged hospitalization, likely prolonged rehabilitation stay.  Discussed with renal failure only way to make better likely unless it gets better on its own is dialysis.  Discussed this will be long-term dependent care most likely.  Wife and daughter both state he would never want dialysis.  States he would not want to be dependent on others.  He would not want prolonged life support.  Advocated for continued aggressive care.  Given his wishes he would not pursue dialysis.  If things are not improving in the coming days consider discussion of comfort care which was mentioned.  I recommended patient be DNR given his severe multisystem organ failure and his wishes about not desiring prolonged life support or live a life where he was dependent on other people.  Wife expressed understanding and agreed to DNR.  Code status: Full DNR  Disposition: Continue current acute care  Time spent for the meeting: 10 minutes    Lanier Clam, MD  01/01/2022, 2:39 PM

## 2022-01-02 DIAGNOSIS — K810 Acute cholecystitis: Secondary | ICD-10-CM | POA: Diagnosis not present

## 2022-01-02 LAB — CBC
HCT: 26.3 % — ABNORMAL LOW (ref 39.0–52.0)
Hemoglobin: 9.1 g/dL — ABNORMAL LOW (ref 13.0–17.0)
MCH: 31.8 pg (ref 26.0–34.0)
MCHC: 34.6 g/dL (ref 30.0–36.0)
MCV: 92 fL (ref 80.0–100.0)
Platelets: 109 10*3/uL — ABNORMAL LOW (ref 150–400)
RBC: 2.86 MIL/uL — ABNORMAL LOW (ref 4.22–5.81)
RDW: 14.3 % (ref 11.5–15.5)
WBC: 17.6 10*3/uL — ABNORMAL HIGH (ref 4.0–10.5)
nRBC: 0.1 % (ref 0.0–0.2)

## 2022-01-02 LAB — COMPREHENSIVE METABOLIC PANEL
ALT: 11 U/L (ref 0–44)
AST: 24 U/L (ref 15–41)
Albumin: 2.3 g/dL — ABNORMAL LOW (ref 3.5–5.0)
Alkaline Phosphatase: 43 U/L (ref 38–126)
Anion gap: 15 (ref 5–15)
BUN: 67 mg/dL — ABNORMAL HIGH (ref 8–23)
CO2: 21 mmol/L — ABNORMAL LOW (ref 22–32)
Calcium: 8.6 mg/dL — ABNORMAL LOW (ref 8.9–10.3)
Chloride: 104 mmol/L (ref 98–111)
Creatinine, Ser: 2.41 mg/dL — ABNORMAL HIGH (ref 0.61–1.24)
GFR, Estimated: 27 mL/min — ABNORMAL LOW (ref >60)
Glucose, Bld: 189 mg/dL — ABNORMAL HIGH (ref 70–99)
Potassium: 4.1 mmol/L (ref 3.5–5.1)
Sodium: 140 mmol/L (ref 135–145)
Total Bilirubin: 1 mg/dL (ref 0.3–1.2)
Total Protein: 6.2 g/dL — ABNORMAL LOW (ref 6.5–8.1)

## 2022-01-02 LAB — GLUCOSE, CAPILLARY
Glucose-Capillary: 182 mg/dL — ABNORMAL HIGH (ref 70–99)
Glucose-Capillary: 226 mg/dL — ABNORMAL HIGH (ref 70–99)
Glucose-Capillary: 228 mg/dL — ABNORMAL HIGH (ref 70–99)
Glucose-Capillary: 221 mg/dL — ABNORMAL HIGH (ref 70–99)
Glucose-Capillary: 207 mg/dL — ABNORMAL HIGH (ref 70–99)
Glucose-Capillary: 209 mg/dL — ABNORMAL HIGH (ref 70–99)

## 2022-01-02 LAB — CULTURE, BLOOD (ROUTINE X 2)
Culture: NO GROWTH
Culture: NO GROWTH
Special Requests: ADEQUATE

## 2022-01-02 LAB — TRIGLYCERIDES: Triglycerides: 644 mg/dL — ABNORMAL HIGH (ref <150)

## 2022-01-02 MED ORDER — INSULIN ASPART 100 UNIT/ML IJ SOLN
0.0000 [IU] | INTRAMUSCULAR | Status: DC
  Administered 2022-01-02 (×3): 5 [IU] via SUBCUTANEOUS
  Administered 2022-01-03: 3 [IU] via SUBCUTANEOUS
  Administered 2022-01-03: 2 [IU] via SUBCUTANEOUS
  Administered 2022-01-03: 5 [IU] via SUBCUTANEOUS
  Administered 2022-01-03: 3 [IU] via SUBCUTANEOUS
  Administered 2022-01-03 – 2022-01-04 (×3): 2 [IU] via SUBCUTANEOUS
  Administered 2022-01-05: 5 [IU] via SUBCUTANEOUS
  Administered 2022-01-05 (×2): 2 [IU] via SUBCUTANEOUS
  Administered 2022-01-05: 3 [IU] via SUBCUTANEOUS
  Administered 2022-01-06: 2 [IU] via SUBCUTANEOUS
  Administered 2022-01-06: 5 [IU] via SUBCUTANEOUS
  Administered 2022-01-06 (×3): 3 [IU] via SUBCUTANEOUS
  Administered 2022-01-06: 5 [IU] via SUBCUTANEOUS
  Administered 2022-01-06: 3 [IU] via SUBCUTANEOUS
  Administered 2022-01-07 (×2): 2 [IU] via SUBCUTANEOUS
  Administered 2022-01-07: 3 [IU] via SUBCUTANEOUS
  Administered 2022-01-07 (×3): 2 [IU] via SUBCUTANEOUS
  Administered 2022-01-08 (×3): 3 [IU] via SUBCUTANEOUS
  Administered 2022-01-08: 2 [IU] via SUBCUTANEOUS
  Administered 2022-01-08 – 2022-01-09 (×4): 3 [IU] via SUBCUTANEOUS
  Administered 2022-01-09: 2 [IU] via SUBCUTANEOUS

## 2022-01-02 MED ORDER — INSULIN ASPART 100 UNIT/ML IJ SOLN
3.0000 [IU] | INTRAMUSCULAR | Status: DC
  Administered 2022-01-02 – 2022-01-03 (×10): 3 [IU] via SUBCUTANEOUS

## 2022-01-02 MED ORDER — OSMOLITE 1.5 CAL PO LIQD
1000.0000 mL | ORAL | Status: DC
  Administered 2022-01-03 – 2022-01-09 (×7): 1000 mL
  Filled 2022-01-02 (×4): qty 1000

## 2022-01-02 MED ORDER — MIDAZOLAM HCL 2 MG/2ML IJ SOLN
1.0000 mg | INTRAMUSCULAR | Status: AC | PRN
  Administered 2022-01-02 (×3): 1 mg via INTRAVENOUS
  Filled 2022-01-02 (×4): qty 2

## 2022-01-02 MED ORDER — PANTOPRAZOLE 2 MG/ML SUSPENSION
40.0000 mg | Freq: Every day | ORAL | Status: DC
  Administered 2022-01-02 – 2022-01-08 (×7): 40 mg
  Filled 2022-01-02 (×7): qty 20

## 2022-01-02 MED ORDER — PROSOURCE TF PO LIQD
45.0000 mL | Freq: Three times a day (TID) | ORAL | Status: DC
  Administered 2022-01-02 – 2022-01-08 (×21): 45 mL
  Filled 2022-01-02 (×20): qty 45

## 2022-01-02 MED ORDER — MIDAZOLAM HCL 2 MG/2ML IJ SOLN
1.0000 mg | INTRAMUSCULAR | Status: DC | PRN
Start: 2022-01-02 — End: 2022-01-06
  Administered 2022-01-02 – 2022-01-05 (×19): 1 mg via INTRAVENOUS
  Filled 2022-01-02 (×20): qty 2

## 2022-01-02 MED ORDER — MIDAZOLAM HCL 2 MG/2ML IJ SOLN
1.0000 mg | INTRAMUSCULAR | Status: DC | PRN
Start: 2022-01-02 — End: 2022-01-02
  Administered 2022-01-02 (×2): 1 mg via INTRAVENOUS
  Filled 2022-01-02 (×2): qty 2

## 2022-01-02 MED ORDER — DEXMEDETOMIDINE HCL IN NACL 400 MCG/100ML IV SOLN
0.4000 ug/kg/h | INTRAVENOUS | Status: DC
  Administered 2022-01-02: 0.8 ug/kg/h via INTRAVENOUS
  Administered 2022-01-02: 0.4 ug/kg/h via INTRAVENOUS
  Administered 2022-01-02: 1 ug/kg/h via INTRAVENOUS
  Administered 2022-01-03: 0.8 ug/kg/h via INTRAVENOUS
  Administered 2022-01-03 (×2): 0.6 ug/kg/h via INTRAVENOUS
  Administered 2022-01-03: 1 ug/kg/h via INTRAVENOUS
  Administered 2022-01-04: 0.7 ug/kg/h via INTRAVENOUS
  Administered 2022-01-04: .7 ug/kg/h via INTRAVENOUS
  Administered 2022-01-04: 0.8 ug/kg/h via INTRAVENOUS
  Administered 2022-01-05: 0.7 ug/kg/h via INTRAVENOUS
  Administered 2022-01-05 – 2022-01-07 (×12): 1.2 ug/kg/h via INTRAVENOUS
  Administered 2022-01-07: 0.5 ug/kg/h via INTRAVENOUS
  Administered 2022-01-07: 1.2 ug/kg/h via INTRAVENOUS
  Administered 2022-01-07: 0.6 ug/kg/h via INTRAVENOUS
  Administered 2022-01-07: 1.2 ug/kg/h via INTRAVENOUS
  Filled 2022-01-02 (×25): qty 100

## 2022-01-02 NOTE — Progress Notes (Signed)
General Surgery Follow Up Note  Subjective:    Overnight Issues:   Objective:  Vital signs for last 24 hours: Temp:  [97.8 F (36.6 C)-98.8 F (37.1 C)] 97.8 F (36.6 C) (07/06 0754) Pulse Rate:  [54-86] 54 (07/06 1105) Resp:  [13-23] 18 (07/06 1105) BP: (84-174)/(48-93) 101/62 (07/06 1100) SpO2:  [91 %-99 %] 95 % (07/06 1105) Arterial Line BP: (90-186)/(42-74) 111/47 (07/06 1100) FiO2 (%):  [40 %] 40 % (07/06 1105) Weight:  [89 kg] 89 kg (07/06 0432)  Hemodynamic parameters for last 24 hours:    Intake/Output from previous day: 07/05 0701 - 07/06 0700 In: 45106.4 [P.O.:200; I.V.:1231.3; NG/GT:540; IV Piggyback:125.1] Out: 95621 [Urine:49100; Drains:250]  Intake/Output this shift: Total I/O In: 6561.2 [I.V.:265.8; Other:6000; NG/GT:295.3] Out: 5700 [Urine:5700]  Vent settings for last 24 hours: Vent Mode: PRVC FiO2 (%):  [40 %] 40 % Set Rate:  [18 bmp] 18 bmp Vt Set:  [510 mL] 510 mL PEEP:  [5 cmH20] 5 cmH20 Plateau Pressure:  [17 cmH20-19 cmH20] 17 cmH20  Physical Exam:  Gen: comfortable, no distress Neuro: non-focal exam HEENT: PERRL Neck: supple CV: RRR Pulm: unlabored breathing Abd: soft, drain bilious as expected GU: clear yellow urine Extr: wwp, no edema   Results for orders placed or performed during the hospital encounter of 12/28/21 (from the past 24 hour(s))  Glucose, capillary     Status: Abnormal   Collection Time: 01/01/22  3:57 PM  Result Value Ref Range   Glucose-Capillary 170 (H) 70 - 99 mg/dL  Glucose, capillary     Status: Abnormal   Collection Time: 01/01/22  7:26 PM  Result Value Ref Range   Glucose-Capillary 181 (H) 70 - 99 mg/dL  Glucose, capillary     Status: Abnormal   Collection Time: 01/01/22 11:21 PM  Result Value Ref Range   Glucose-Capillary 192 (H) 70 - 99 mg/dL  Glucose, capillary     Status: Abnormal   Collection Time: 01/02/22  3:38 AM  Result Value Ref Range   Glucose-Capillary 182 (H) 70 - 99 mg/dL   Triglycerides     Status: Abnormal   Collection Time: 01/02/22  4:31 AM  Result Value Ref Range   Triglycerides 644 (H) <150 mg/dL  CBC     Status: Abnormal   Collection Time: 01/02/22  4:31 AM  Result Value Ref Range   WBC 17.6 (H) 4.0 - 10.5 K/uL   RBC 2.86 (L) 4.22 - 5.81 MIL/uL   Hemoglobin 9.1 (L) 13.0 - 17.0 g/dL   HCT 26.3 (L) 39.0 - 52.0 %   MCV 92.0 80.0 - 100.0 fL   MCH 31.8 26.0 - 34.0 pg   MCHC 34.6 30.0 - 36.0 g/dL   RDW 14.3 11.5 - 15.5 %   Platelets 109 (L) 150 - 400 K/uL   nRBC 0.1 0.0 - 0.2 %  Comprehensive metabolic panel     Status: Abnormal   Collection Time: 01/02/22  4:31 AM  Result Value Ref Range   Sodium 140 135 - 145 mmol/L   Potassium 4.1 3.5 - 5.1 mmol/L   Chloride 104 98 - 111 mmol/L   CO2 21 (L) 22 - 32 mmol/L   Glucose, Bld 189 (H) 70 - 99 mg/dL   BUN 67 (H) 8 - 23 mg/dL   Creatinine, Ser 2.41 (H) 0.61 - 1.24 mg/dL   Calcium 8.6 (L) 8.9 - 10.3 mg/dL   Total Protein 6.2 (L) 6.5 - 8.1 g/dL   Albumin 2.3 (L) 3.5 -  5.0 g/dL   AST 24 15 - 41 U/L   ALT 11 0 - 44 U/L   Alkaline Phosphatase 43 38 - 126 U/L   Total Bilirubin 1.0 0.3 - 1.2 mg/dL   GFR, Estimated 27 (L) >60 mL/min   Anion gap 15 5 - 15  Glucose, capillary     Status: Abnormal   Collection Time: 01/02/22  7:50 AM  Result Value Ref Range   Glucose-Capillary 226 (H) 70 - 99 mg/dL  Glucose, capillary     Status: Abnormal   Collection Time: 01/02/22 11:54 AM  Result Value Ref Range   Glucose-Capillary 228 (H) 70 - 99 mg/dL    Assessment & Plan:  Present on Admission:  Acute cholecystitis    LOS: 5 days   Additional comments:I reviewed the patient's new clinical lab test results.   and I reviewed the patients new imaging test results.    Acute cholecystitis - S/p percutaneous cholecystostomy tube 7/2, 10 Fr, Cx w/ pan-sensitive E. Coli. De-escalated to CTX, okay to d/c at end of day today from surgical standpoint, but patient may have an indication for broader coverage from  primary team standpoint.  - will need outpatient follow up with [facility] to discuss interval cholecystectomy once he recovers   FEN: NPO, IVF; okay for trickle TF while on single agent vasopressin, okay to titrate up, now that he is clinically improving ID: Zosyn 7/1 >>  VTE: SCDs, heparin gtt Foley: per primary Dispo: ICU    Below per CCM-- Septic shock due to above - recommend art line placement, avoidance of hypotension-inducing sedative agents, consideration of cortisol stim vs empiric stress dose steroids Acute respiratory failure  Hx A. Fib on Coumadin with supratherapeutic INR - on heparin drip, agree with continuing despite hematuria as hgb is stable Hematuria - recommend holding Murphy drip to send UA/cx, then restarting to r/o UTI as etiology for hematuria.  AKI - CRRT    Jesusita Oka, MD Trauma & General Surgery Please use [email or web address] to contact on call provider  01/02/2022  *Care during the described time interval was provided by me. I have reviewed this patient's available data, including medical history, events of note, physical examination and test results as part of my evaluation.

## 2022-01-02 NOTE — Inpatient Diabetes Management (Signed)
Inpatient Diabetes Program Recommendations  AACE/ADA: New Consensus Statement on Inpatient Glycemic Control (2015)  Target Ranges:  Prepandial:   less than 140 mg/dL      Peak postprandial:   less than 180 mg/dL (1-2 hours)      Critically ill patients:  140 - 180 mg/dL   Lab Results  Component Value Date   GLUCAP 182 (H) 01/02/2022   HGBA1C 5.7 (A) 07/29/2021    Review of Glycemic Control  Latest Reference Range & Units 01/01/22 19:26 01/01/22 23:21 01/02/22 03:38  Glucose-Capillary 70 - 99 mg/dL 181 (H) 192 (H) 182 (H)  (H): Data is abnormally high Diabetes history: PreDM Outpatient Diabetes medications: none Current orders for Inpatient glycemic control: Novolog 0-9 units Q4H Solumedrol 40 mg Q8H  Inpatient Diabetes Program Recommendations:    Consider adding Novolog 3 units Q4H for tube feed coverage (to be stopped or held in the event tube feeds are stopped).   Thanks, Bronson Curb, MSN, RNC-OB Diabetes Coordinator [phone] (8a-5p)

## 2022-01-02 NOTE — Progress Notes (Signed)
Universal Progress Note Patient Name: [name] DOB: 23-Mar-1946 MRN: [id number]   Date of Service  01/02/2022  HPI/Events of Note  Plan: 1. Agitation - Currently on Precedex IV infusion at 1 mcg/kg/hour. HR = 62 limits increasing Precedex dose.  Also on a Fentanyl IV infusion. Kicking - Nursing request for bilateral wrist and ankle restraints. 2. CBI catheter clotted. Patient still has gross hematuria.   eICU Interventions  Plan: Increase Versed dose to 1 mg IV Q 1 hours. Bilateral soft wrist and ankle restraints X 12 hours.  Replace CBI catheter and continue CBI.     Intervention Category Major Interventions: Delirium, psychosis, severe agitation - evaluation and management  Gelisa Tieken [place] 01/02/2022, 8:56 PM

## 2022-01-02 NOTE — Progress Notes (Signed)
NAME:  [name], MRN:  [id number], DOB:  [birth year], LOS: 5 ADMISSION DATE:  12/28/2021, CONSULTATION DATE: 12/29/2018. REFERRING MD: ER physician, CHIEF COMPLAINT: Sepsis  History of Present Illness:  76 year old male who presents to the hospital several days general malaise, fever, abdominal pain more focused in the right side.  He is notable for admission for supratherapeutic INR which he takes Coumadin for atrial fibrillation.  CT scan of the abdomen demonstrated enlarged gallbladder consistent with cholecystitis and question thrombus versus unopacified blood within LEFT atrial appendage;   Pertinent  Medical History   Past Medical History:  Diagnosis Date   Allergic rhinitis 08/08/2013   takes Loratadine daily    Aortic stenosis 09/08/2011   With mild aortic regurgitation, mean gradient 13 mmHg    Cataract    right but immature   Complication of anesthesia    stopped breathing - during shoulder surgery 2009-2010   Coronary artery disease 10/02/2009   Cardiac cath (October 2007): 20% left main, 60% mid LAD, 60-70% distal LAD, 30-40% first diagonal, 30% circumflex, 40% OM, 30-40% RCA    Degenerative joint disease of shoulder 08/03/2013   Bilateral, s/p right total shoulder arthroplasty 05/29/2011 and left shoulder arthroplasty 29/79/8921   Diastolic dysfunction 19/09/1738   Echo (04/04/2016): Grade I   Diverticulosis 10/07/2013   Seen on colonoscopy in 2007    Erectile dysfunction 05/07/2009   Essential hypertension 08/03/2013   had been on Lisinopril but stopped per MD   First degree AV block 03/14/2016   Gastric ulcer 10/07/2013   Seen on EGD 04/10/2006    Hemorrhoids 10/07/2013   Hyperlipidemia 10/02/2009   Paroxysmal atrial fibrillation ([place]) 07/14/2006   One episode, provoked by alcohol use disorder, briefly anticoagulated with warfarin, then switched to aspirin alone   Sciatica associated with disorder of lumbar spine 08/03/2013   Anterolisthesis with right L 4-5 nerve root  compression.  Treated with epidural injections and Physical Therapy.    Seborrheic keratosis 10/07/2013   Tubular adenoma of colon    Significant Hospital Events: Including procedures, antibiotic start and stop dates in addition to other pertinent events   12/28/2021 admitted for cholecystitis 7/2 intubated for rapid respiratory compromise 7/3 pressor requirement increase, volume overloaded 7/4 largely unchanged compared to day prior, creatinine increasing, trial of Lasix 7/6 Remains on CBI overnight with improvement of pressors needs after initiation of stress dose steroids   Interim History / Subjective:  Sedated on vent.   Objective   Blood pressure 99/64, pulse 60, temperature 97.8 F (36.6 C), temperature source Axillary, resp. rate 18, height '5\' 6"'$  (1.676 m), weight 89 kg, SpO2 94 %.    Vent Mode: PRVC FiO2 (%):  [40 %] 40 % Set Rate:  [18 bmp] 18 bmp Vt Set:  [510 mL] 510 mL PEEP:  [5 cmH20] 5 cmH20 Plateau Pressure:  [17 cmH20-19 cmH20] 19 cmH20   Intake/Output Summary (Last 24 hours) at 01/02/2022 0804 Last data filed at 01/02/2022 8144 Gross per 24 hour  Intake 42019.36 ml  Output 44600 ml  Net -2580.64 ml    Filed Weights   12/30/21 0500 12/31/21 0500 01/02/22 0432  Weight: 87.6 kg 93.5 kg 89 kg    Examination: General: Acute on chronically ill appearing elderly male lying in bed on mechanical ventilation, in NAD HEENT: ETT, MM pink/moist, PERRL,  Neuro: Sedated on vent CV: s1s2 regular rate and rhythm, no murmur, rubs, or gallops,  PULM:  Clear to ascultation, no increased work or breathing, tolerating  vent  GI: soft, bowel sounds active in all 4 quadrants, non-tender, non-distended, tolerating TF Extremities: warm/dry, no edema  Skin: no rashes or lesions  Resolved Hospital Problem list   Supratherapeutic INR: Resolved with holding coumadin  Assessment & Plan:  Severe sepsis and septic shock due to cholecystitis -Drain placement 7/2, appreciate IR, surgery  assistance -TTE ok -WBC increased slightly am of 7/6 from 14 to 16, possibly secondary to steroids  P: Remains on vent  Cultures reman NTD Continue IV Ceftriaxone  Pressors for MAP goal > 65 Monitor urine output Continue to trend CBC and fever curve   Acute Hypoxemic respiratory failure: -Due to volume overload, severe sepsis with shock P: Continue ventilator support with lung protective strategies  Wean PEEP and FiO2 for sats greater than 90%. Head of bed elevated 30 degrees. Plateau pressures less than 30 cm H20.  Follow intermittent chest x-ray and ABG.   SAT/SBT as tolerated, mentation preclude extubation  Ensure adequate pulmonary hygiene  Follow cultures  VAP bundle in place  PAD protocol, stop Propofol lin favor of Precedex   Acute renal failure - Improving  -Mixed physiology from sepsis, ATN with hypotension, volume overload P: Follow renal function  Monitor urine output Trend Bmet Avoid nephrotoxins Ensure adequate renal perfusion   Hematuria -Description of blood clot at end of I/O catheter x2 without hematuria with subsequent development of significant gross hematuria. P: Urine output remains pink in color, daily attempts to stop CBI Once CBI stopped challenge with heparin drip prior to resuming coumadin   Chronic atrial fibrillation on Coumadin --d/c heparin 7/5 after hematuria P: Continuous telemetry  Resume anticoagulation when able   Question thrombus versus unopacified blood within LEFT atrial appendage;  -None seen on 2D TTE  Best Practice (right click and "Reselect all SmartList Selections" daily)   Diet/type: NPO  DVT prophylaxis: hold with hematuria GI prophylaxis: PPI Lines: N/A Foley:  N/A Code Status:  DNR Last date of multidisciplinary goals of care discussion [See IPAL 7/5]  CRITICAL CARE Performed by: Jujhar Everett D. Harris   Total critical care time: 38 minutes  Critical care time was exclusive of separately billable procedures  and treating other patients.  Critical care was necessary to treat or prevent imminent or life-threatening deterioration.  Critical care was time spent personally by me on the following activities: development of treatment plan with patient and/or surrogate as well as nursing, discussions with consultants, evaluation of patient's response to treatment, examination of patient, obtaining history from patient or surrogate, ordering and performing treatments and interventions, ordering and review of laboratory studies, ordering and review of radiographic studies, pulse oximetry and re-evaluation of patient's condition.  Dabid Godown D. Kenton Kingfisher, NP-C  Pulmonary & Critical Care Personal contact information can be found on Amion  01/02/2022, 8:26 AM

## 2022-01-02 NOTE — Progress Notes (Signed)
Nutrition Follow-up  DOCUMENTATION CODES:   Not applicable  INTERVENTION:   Increase Osmolite 1.5 by 10 ml every 4 hours to goal rate of 55 ml/hr (1320 ml/day) ProSource TF 45 ml TID   Tube feeding regimen will provide 2100 kcal, 116 grams of protein, and 1006 ml of H2O.    NUTRITION DIAGNOSIS:   Inadequate oral intake related to inability to eat as evidenced by NPO status. Ongoing.   GOAL:   Patient will meet greater than or equal to 90% of their needs Met once TF at goal   MONITOR:   Vent status, Labs, Weight trends, TF tolerance, I & O's  REASON FOR ASSESSMENT:   Ventilator, Consult Enteral/tube feeding initiation and management (trickle tube feeds)  ASSESSMENT:   76 year old male who presented to the ED on 7/01 with abdominal pain. PMH of PAF, HLD, CAD, HTN. Pt admitted with sepsis secondary to acute cholecystitis.  7/1 admit for cholecystitis  7/2 intubated; drain placed 7/3 increased pressor requirement; volume overload; trickle TF started 7/6 TF advancing to goal   Medications reviewed and include: SSI, novolog 3 units every 4 hours, solu-medrol, protonix, miralax, senokot-s  Precedex  Fentanyl  Levo stopped Vasopressin @ .03   Labs reviewed:  CBG's: 148-226  RUQ drain: 250 ml  UOP: 49,100 ml I&O: -3.9 L  OG tube; per xray in stomach   Diet Order:   Diet Order     None       EDUCATION NEEDS:   No education needs have been identified at this time  Skin:  Skin Assessment: Reviewed RN Assessment  Last BM:  7/6, rectal tube in place  Height:   Ht Readings from Last 1 Encounters:  12/28/21 _0  (1.676 m)    Weight:   Wt Readings from Last 1 Encounters:  01/02/22 89 kg    Ideal Body Weight:  64.5 kg  BMI:  Body mass index is 31.67 kg/m.  Estimated Nutritional Needs:   Kcal:  1900-2100  Protein:  105-125 grams  Fluid:  >/= 1.8 L  Westin Knotts P., RD, LDN, CNSC See AMiON for contact information

## 2022-01-03 ENCOUNTER — Inpatient Hospital Stay (HOSPITAL_COMMUNITY): Payer: Medicare HMO

## 2022-01-03 DIAGNOSIS — K810 Acute cholecystitis: Secondary | ICD-10-CM | POA: Diagnosis not present

## 2022-01-03 LAB — CBC
HCT: 25.1 % — ABNORMAL LOW (ref 39.0–52.0)
Hemoglobin: 8.4 g/dL — ABNORMAL LOW (ref 13.0–17.0)
MCH: 31.5 pg (ref 26.0–34.0)
MCHC: 33.5 g/dL (ref 30.0–36.0)
MCV: 94 fL (ref 80.0–100.0)
Platelets: 107 10*3/uL — ABNORMAL LOW (ref 150–400)
RBC: 2.67 MIL/uL — ABNORMAL LOW (ref 4.22–5.81)
RDW: 14.6 % (ref 11.5–15.5)
WBC: 18.7 10*3/uL — ABNORMAL HIGH (ref 4.0–10.5)
nRBC: 0.3 % — ABNORMAL HIGH (ref 0.0–0.2)

## 2022-01-03 LAB — BASIC METABOLIC PANEL
Anion gap: 9 (ref 5–15)
Anion gap: 16 — ABNORMAL HIGH (ref 5–15)
BUN: 66 mg/dL — ABNORMAL HIGH (ref 8–23)
BUN: 89 mg/dL — ABNORMAL HIGH (ref 8–23)
CO2: 13 mmol/L — ABNORMAL LOW (ref 22–32)
CO2: 17 mmol/L — ABNORMAL LOW (ref 22–32)
Calcium: 5.7 mg/dL — CL (ref 8.9–10.3)
Calcium: 8 mg/dL — ABNORMAL LOW (ref 8.9–10.3)
Chloride: 122 mmol/L — ABNORMAL HIGH (ref 98–111)
Chloride: 111 mmol/L (ref 98–111)
Creatinine, Ser: 2.19 mg/dL — ABNORMAL HIGH (ref 0.61–1.24)
Creatinine, Ser: 3.07 mg/dL — ABNORMAL HIGH (ref 0.61–1.24)
GFR, Estimated: 31 mL/min — ABNORMAL LOW (ref >60)
GFR, Estimated: 21 mL/min — ABNORMAL LOW (ref >60)
Glucose, Bld: 111 mg/dL — ABNORMAL HIGH (ref 70–99)
Glucose, Bld: 178 mg/dL — ABNORMAL HIGH (ref 70–99)
Potassium: 3.1 mmol/L — ABNORMAL LOW (ref 3.5–5.1)
Potassium: 4.2 mmol/L (ref 3.5–5.1)
Sodium: 144 mmol/L (ref 135–145)
Sodium: 144 mmol/L (ref 135–145)

## 2022-01-03 LAB — GLUCOSE, CAPILLARY
Glucose-Capillary: 154 mg/dL — ABNORMAL HIGH (ref 70–99)
Glucose-Capillary: 135 mg/dL — ABNORMAL HIGH (ref 70–99)
Glucose-Capillary: 196 mg/dL — ABNORMAL HIGH (ref 70–99)
Glucose-Capillary: 144 mg/dL — ABNORMAL HIGH (ref 70–99)
Glucose-Capillary: 183 mg/dL — ABNORMAL HIGH (ref 70–99)
Glucose-Capillary: 112 mg/dL — ABNORMAL HIGH (ref 70–99)

## 2022-01-03 LAB — MAGNESIUM: Magnesium: 2.4 mg/dL (ref 1.7–2.4)

## 2022-01-03 LAB — AEROBIC/ANAEROBIC CULTURE W GRAM STAIN (SURGICAL/DEEP WOUND): Special Requests: NORMAL

## 2022-01-03 LAB — PHOSPHORUS: Phosphorus: 6 mg/dL — ABNORMAL HIGH (ref 2.5–4.6)

## 2022-01-03 MED ORDER — CLONAZEPAM 0.5 MG PO TABS: 0.5000 mg | ORAL_TABLET | Freq: Two times a day (BID) | ORAL | Status: DC

## 2022-01-03 MED ORDER — IOHEXOL 9 MG/ML PO SOLN
ORAL | Status: AC
  Administered 2022-01-03: 500 mL
  Filled 2022-01-03: qty 1000

## 2022-01-03 MED ORDER — CLONAZEPAM 0.5 MG PO TABS
0.5000 mg | ORAL_TABLET | Freq: Two times a day (BID) | ORAL | Status: DC
  Administered 2022-01-03 – 2022-01-08 (×11): 0.5 mg
  Filled 2022-01-03 (×11): qty 1

## 2022-01-03 NOTE — Progress Notes (Signed)
General Surgery Follow Up Note  Subjective:    Overnight Issues:   Objective:  Vital signs for last 24 hours: Temp:  [94.5 F (34.7 C)-98.1 F (36.7 C)] 94.5 F (34.7 C) (07/07 0727) Pulse Rate:  [51-82] 58 (07/07 0800) Resp:  [10-21] 18 (07/07 0800) BP: (98-163)/(47-79) 157/67 (07/07 0748) SpO2:  [88 %-100 %] 93 % (07/07 0800) Arterial Line BP: (93-274)/(44-89) 150/66 (07/07 0800) FiO2 (%):  [40 %-50 %] 50 % (07/07 0800) Weight:  [90.9 kg] 90.9 kg (07/07 0500)  Hemodynamic parameters for last 24 hours:    Intake/Output from previous day: 07/06 0701 - 07/07 0700 In: 26437.3 [I.V.:1198.9; WC/HE:5277.8; IV Piggyback:100] Out: 24235 [Urine:25550; Drains:125]  Intake/Output this shift: Total I/O In: 137.9 [I.V.:77.9; NG/GT:60] Out: 3250 [Urine:3250]  Vent settings for last 24 hours: Vent Mode: PRVC FiO2 (%):  [40 %-50 %] 50 % Set Rate:  [18 bmp] 18 bmp Vt Set:  [510 mL] 510 mL PEEP:  [5 cmH20] 5 cmH20 Plateau Pressure:  [11 cmH20-17 cmH20] 14 cmH20  Physical Exam:  Gen: comfortable, no distress Neuro: non-focal exam HEENT: PERRL Neck: supple CV: RRR Pulm: unlabored breathing Abd: soft, NT GU: clear yellow urine Extr: wwp, no edema   Results for orders placed or performed during the hospital encounter of 12/28/21 (from the past 24 hour(s))  Glucose, capillary     Status: Abnormal   Collection Time: 01/02/22 11:54 AM  Result Value Ref Range   Glucose-Capillary 228 (H) 70 - 99 mg/dL  Glucose, capillary     Status: Abnormal   Collection Time: 01/02/22  3:17 PM  Result Value Ref Range   Glucose-Capillary 221 (H) 70 - 99 mg/dL  Glucose, capillary     Status: Abnormal   Collection Time: 01/02/22  7:45 PM  Result Value Ref Range   Glucose-Capillary 207 (H) 70 - 99 mg/dL  Glucose, capillary     Status: Abnormal   Collection Time: 01/02/22 11:27 PM  Result Value Ref Range   Glucose-Capillary 209 (H) 70 - 99 mg/dL  Glucose, capillary     Status: Abnormal    Collection Time: 01/03/22  3:23 AM  Result Value Ref Range   Glucose-Capillary 154 (H) 70 - 99 mg/dL  CBC     Status: Abnormal   Collection Time: 01/03/22  6:29 AM  Result Value Ref Range   WBC 18.7 (H) 4.0 - 10.5 K/uL   RBC 2.67 (L) 4.22 - 5.81 MIL/uL   Hemoglobin 8.4 (L) 13.0 - 17.0 g/dL   HCT 25.1 (L) 39.0 - 52.0 %   MCV 94.0 80.0 - 100.0 fL   MCH 31.5 26.0 - 34.0 pg   MCHC 33.5 30.0 - 36.0 g/dL   RDW 14.6 11.5 - 15.5 %   Platelets 107 (L) 150 - 400 K/uL   nRBC 0.3 (H) 0.0 - 0.2 %  Glucose, capillary     Status: Abnormal   Collection Time: 01/03/22  7:21 AM  Result Value Ref Range   Glucose-Capillary 135 (H) 70 - 99 mg/dL  Basic metabolic panel     Status: Abnormal   Collection Time: 01/03/22  7:23 AM  Result Value Ref Range   Sodium 144 135 - 145 mmol/L   Potassium 3.1 (L) 3.5 - 5.1 mmol/L   Chloride 122 (H) 98 - 111 mmol/L   CO2 13 (L) 22 - 32 mmol/L   Glucose, Bld 111 (H) 70 - 99 mg/dL   BUN 66 (H) 8 - 23 mg/dL   Creatinine,  Ser 2.19 (H) 0.61 - 1.24 mg/dL   Calcium 5.7 (LL) 8.9 - 10.3 mg/dL   GFR, Estimated 31 (L) >60 mL/min   Anion gap 9 5 - 15  Basic metabolic panel     Status: Abnormal   Collection Time: 01/03/22  8:43 AM  Result Value Ref Range   Sodium 144 135 - 145 mmol/L   Potassium 4.2 3.5 - 5.1 mmol/L   Chloride 111 98 - 111 mmol/L   CO2 17 (L) 22 - 32 mmol/L   Glucose, Bld 178 (H) 70 - 99 mg/dL   BUN 89 (H) 8 - 23 mg/dL   Creatinine, Ser 3.07 (H) 0.61 - 1.24 mg/dL   Calcium 8.0 (L) 8.9 - 10.3 mg/dL   GFR, Estimated 21 (L) >60 mL/min   Anion gap 16 (H) 5 - 15  Magnesium     Status: None   Collection Time: 01/03/22  8:43 AM  Result Value Ref Range   Magnesium 2.4 1.7 - 2.4 mg/dL  Phosphorus     Status: Abnormal   Collection Time: 01/03/22  8:43 AM  Result Value Ref Range   Phosphorus 6.0 (H) 2.5 - 4.6 mg/dL    Assessment & Plan:  Present on Admission:  Acute cholecystitis    LOS: 6 days   Additional comments:I reviewed the patient's new  clinical lab test results.   and I reviewed the patients new imaging test results.    Acute cholecystitis - S/p percutaneous cholecystostomy tube 7/2, 10 Fr, Cx w/ pan-sensitive E. Coli. De-escalated to CTX, okay to d/c at end of day today from surgical standpoint, but patient may have an indication for broader coverage from primary team standpoint.  - will need outpatient follow up with [facility] to discuss interval cholecystectomy once he recovers   FEN: NPO, IVF; okay for trickle TF while on single agent vasopressin, okay to titrate up, now that he is clinically improving ID: Zosyn 7/1 >>  VTE: SCDs, heparin gtt Foley: per primary Dispo: ICU    Below per CCM-- Septic shock due to above - recommend art line placement, avoidance of hypotension-inducing sedative agents, consideration of cortisol stim vs empiric stress dose steroids Acute respiratory failure  Hx A. Fib on Coumadin with supratherapeutic INR - on heparin drip, agree with continuing despite hematuria as hgb is stable Hematuria - recommend holding Murphy drip to send UA/cx, then restarting to r/o UTI as etiology for hematuria.  AKI - CRRT    Richard Oka, MD Trauma & General Surgery Please use [email or web address] to contact on call provider  01/03/2022  *Care during the described time interval was provided by me. I have reviewed this patient's available data, including medical history, events of note, physical examination and test results as part of my evaluation.

## 2022-01-03 NOTE — Progress Notes (Signed)
Pt switched to PS 5/5 50% due to asynchrony. Had attempted to adjust settings for improved compliance without change. PT tolerating PS well. Will continue to monitor.

## 2022-01-03 NOTE — Progress Notes (Signed)
Called by EMD to notify family of findings of CT abdomen and pelvis concerning for bladder rupture.   [name]'s spouse, [name], at [phone] and [phone]. No answer  Called [name]. Updated her on CT scan findings. She would like the patient to undergo cystogram and urologic intervention if indicated.   CT Cystogram abd/pelvis  Communicated to EMD for follow up. Nursing staff notified.   [employer]mond [employer]., MSN, APRN, AGACNP-BC [facility]  01/03/2022 , 11:36 PM  Please see [email or web address] for pager details  If no response, please call [phone] After hours, please call Elink at [phone]

## 2022-01-03 NOTE — Progress Notes (Signed)
Referring Physician(s): Reather Laurence   Supervising Physician: Sandi Mariscal  Patient Status:  [name] - In-pt  Chief Complaint: Acute cholecystitis  Subjective: Pt laying in bed, intubated and sedated.  Family at bedside.  Perc chole in place with ongoing bilious output.  Worsening creatinine.   Allergies: Patient has no known allergies.  Medications: Prior to Admission medications   Medication Sig Start Date End Date Taking? Authorizing Provider  allopurinol (ZYLOPRIM) 100 MG tablet TAKE 2 TABLETS EVERY DAY (DOSE INCREASE) Patient taking differently: Take 200 mg by mouth daily. 05/27/21  Yes Axel Filler, MD  atorvastatin (LIPITOR) 40 MG tablet TAKE 1 TABLET EVERY DAY Patient taking differently: Take 40 mg by mouth daily. 08/14/21  Yes Axel Filler, MD  carvedilol (COREG) 6.25 MG tablet TAKE 1 TABLET TWICE DAILY WITH MEALS (NEED MD APPOINTMENT) Patient taking differently: Take 6.25 mg by mouth 2 (two) times daily with a meal. 11/19/21  Yes Crenshaw, Denice Bors, MD  furosemide (LASIX) 20 MG tablet TAKE 1 TABLET EVERY DAY Patient taking differently: Take 20 mg by mouth daily. 11/19/21  Yes Lelon Perla, MD  losartan (COZAAR) 50 MG tablet TAKE 1 TABLET EVERY DAY Patient taking differently: Take 50 mg by mouth daily. 03/20/21  Yes Lelon Perla, MD  naproxen sodium (ALEVE) 220 MG tablet Take 440 mg by mouth daily as needed (pain).   Yes [provider]  warfarin (COUMADIN) 5 MG tablet Take 1-2 tablets by mouth daily or as directed by Coumadin clinic Patient taking differently: Take 2.5-5 mg by mouth daily. 2.5 mg daily on Sunday, Tuesday, Thursday, Saturday 5 mg daily on Monday, Wednesday, Friday 11/19/21  Yes Crenshaw, Denice Bors, MD     Vital Signs: BP (!) 157/67   Pulse 62   Temp 97.6 F (36.4 C) (Oral)   Resp 12   Ht '5\' 6"'$  (1.676 m)   Wt 200 lb 6.4 oz (90.9 kg)   SpO2 96%   BMI 32.35 kg/m   Physical Exam Vitals reviewed.   Constitutional:      Comments: Sedated with propofol   HENT:     Mouth/Throat:     Comments: Intubated  Abdominal:     General: There is distension.  Skin:    Comments: Positive RUQ drain to gravity bag. Site is unremarkable with no erythema, edema, tenderness, bleeding or drainage. Suture and stat lock in place. Dressing is clean, dry, and intact. Flushes and aspirates easily.  Dark bilious output in collection bag.        Imaging: DG CHEST PORT 1 VIEW  Result Date: 01/03/2022 CLINICAL DATA:  Provided history: Hypoxia. EXAM: PORTABLE CHEST 1 VIEW COMPARISON:  Prior chest radiographs 12/30/2021 FINDINGS: ET tube present with tip terminating at the level of the clavicular heads. Unchanged position of a left IJ approach central venous catheter with tip projecting at the level of the lower SVC. An enteric tube passes below the level of the left hemidiaphragm with tip excluded from the field of view. A cholecystostomy tube projects in the region of the right upper quadrant of the abdomen. The cardiomediastinal silhouette is unchanged. Aortic atherosclerosis. Persistent opacity within the right mid to lower lung field compatible with a pleural effusion and underlying atelectasis and/or consolidation. No appreciable airspace consolidation within the left lung. No evidence of pneumothorax. IMPRESSION: Support apparatus, as described. Persistent opacity within the right mid-to-lower lung field compatible with a pleural effusion and underlying atelectasis and/or consolidation. Aortic Atherosclerosis (ICD10-I70.0). Electronically Signed  By: Kellie Simmering D.O.   On: 01/03/2022 08:35    Labs:  CBC: Recent Labs    12/31/21 2209 01/01/22 0252 01/02/22 0431 01/03/22 0629  WBC 13.7* 14.6* 17.6* 18.7*  HGB 9.7* 9.7* 9.1* 8.4*  HCT 28.0* 27.9* 26.3* 25.1*  PLT 101* 107* 109* 107*     COAGS: Recent Labs    12/28/21 0943 12/28/21 1638 12/29/21 0054 12/31/21 0342  INR 7.3* 2.8* 1.7* 1.9*      BMP: Recent Labs    12/31/21 0830 01/02/22 0431 01/03/22 0723 01/03/22 0843  NA 140 140 144 144  K 3.8 4.1 3.1* 4.2  CL 103 104 122* 111  CO2 23 21* 13* 17*  GLUCOSE 141* 189* 111* 178*  BUN 61* 67* 66* 89*  CALCIUM 8.6* 8.6* 5.7* 8.0*  CREATININE 3.17* 2.41* 2.19* 3.07*  GFRNONAA 20* 27* 31* 21*     LIVER FUNCTION TESTS: Recent Labs    12/28/21 0943 01/02/22 0431  BILITOT 2.3* 1.0  AST 28 24  ALT 13 11  ALKPHOS 67 43  PROT 7.0 6.2*  ALBUMIN 3.0* 2.3*     Assessment and Plan: Acute cholecystitis s/p perc chole placement on 7/2 by Dr. Laurence Ferrari.  With ongoing difficulty weaning from vent due to agitation.   WBC trending up, 18.7 today from 12.6 3 days ago. Still hypotensive, on Precedex Afebrile.   Drain Location: RUQ Size: Fr size: 10 Fr Date of placement: 7/2  Currently to: Drain collection device: gravity 24 hour output:  Output by Drain (mL) 01/01/22 0701 - 01/01/22 1900 01/01/22 1901 - 01/02/22 0700 01/02/22 0701 - 01/02/22 1900 01/02/22 1901 - 01/03/22 0700 01/03/22 0701 - 01/03/22 1249  Biliary Tube Cook slip-coat 10.2 Fr. RUQ  250  125     Interval imaging/drain manipulation:  None   Current examination: Flushes/aspirates easily.  Insertion site unremarkable. Suture and stat lock in place. Dressed appropriately.   Plan: Continue TID flushes with 5 cc NS. Record output Q shift. Dressing changes QD or PRN if soiled.  Call IR APP or on call IR MD if difficulty flushing or sudden change in drain output.   Discharge planning: Please contact IR APP or on call IR MD prior to patient d/c to ensure appropriate follow up plans are in place.   IR will continue to follow - please call with questions or concerns.   Electronically Signed: Docia Barrier, PA 01/03/2022, 12:49 PM   I spent a total of 15 Minutes at the the patient's bedside AND on the patient's Frazier floor or unit, greater than 50% of which was  counseling/coordinating care for acute cholecystitis.

## 2022-01-03 NOTE — Progress Notes (Signed)
PT transported on vent to CT then back to 3M03. Pt on vent on PS, tolerating well.

## 2022-01-03 NOTE — Progress Notes (Signed)
NAME:  [name], MRN:  [id number], DOB:  11-01-1945, LOS: 6 ADMISSION DATE:  12/28/2021, CONSULTATION DATE: 12/29/2018. REFERRING MD: ER physician, CHIEF COMPLAINT: Sepsis  History of Present Illness:  76 year old male who presents to the hospital several days general malaise, fever, abdominal pain more focused in the right side.  He is notable for admission for supratherapeutic INR which he takes Coumadin for atrial fibrillation.  CT scan of the abdomen demonstrated enlarged gallbladder consistent with cholecystitis and question thrombus versus unopacified blood within LEFT atrial appendage;   Pertinent  Medical History   Past Medical History:  Diagnosis Date   Allergic rhinitis 08/08/2013   takes Loratadine daily    Aortic stenosis 09/08/2011   With mild aortic regurgitation, mean gradient 13 mmHg    Cataract    right but immature   Complication of anesthesia    stopped breathing - during shoulder surgery 2009-2010   Coronary artery disease 10/02/2009   Cardiac cath (October 2007): 20% left main, 60% mid LAD, 60-70% distal LAD, 30-40% first diagonal, 30% circumflex, 40% OM, 30-40% RCA    Degenerative joint disease of shoulder 08/03/2013   Bilateral, s/p right total shoulder arthroplasty 05/29/2011 and left shoulder arthroplasty 40/98/1191   Diastolic dysfunction 47/01/2955   Echo (04/04/2016): Grade I   Diverticulosis 10/07/2013   Seen on colonoscopy in 2007    Erectile dysfunction 05/07/2009   Essential hypertension 08/03/2013   had been on Lisinopril but stopped per MD   First degree AV block 03/14/2016   Gastric ulcer 10/07/2013   Seen on EGD 04/10/2006    Hemorrhoids 10/07/2013   Hyperlipidemia 10/02/2009   Paroxysmal atrial fibrillation ([place]) 07/14/2006   One episode, provoked by alcohol use disorder, briefly anticoagulated with warfarin, then switched to aspirin alone   Sciatica associated with disorder of lumbar spine 08/03/2013   Anterolisthesis with right L 4-5 nerve root  compression.  Treated with epidural injections and Physical Therapy.    Seborrheic keratosis 10/07/2013   Tubular adenoma of colon    Significant Hospital Events: Including procedures, antibiotic start and stop dates in addition to other pertinent events   12/28/2021 admitted for cholecystitis 7/2 intubated for rapid respiratory compromise 7/3 pressor requirement increase, volume overloaded 7/4 largely unchanged compared to day prior, creatinine increasing, trial of Lasix 7/6 Remains on CBI overnight with improvement of pressors needs after initiation of stress dose steroids  7/7 after discontinuation of propofol infusion agitation a has remained intermittent issue.  When patient gets agitated and combative gross hematuria is again observed.  Interim History / Subjective:  Currently sedated on vent after Versed push  Objective   Blood pressure (!) 163/79, pulse 81, temperature (!) 97.4 F (36.3 C), temperature source Oral, resp. rate 18, height '5\' 6"'$  (1.676 m), weight 90.9 kg, SpO2 96 %.    Vent Mode: PRVC FiO2 (%):  [40 %-50 %] 50 % Set Rate:  [18 bmp] 18 bmp Vt Set:  [510 mL] 510 mL PEEP:  [5 cmH20] 5 cmH20 Plateau Pressure:  [11 cmH20-17 cmH20] 11 cmH20   Intake/Output Summary (Last 24 hours) at 01/03/2022 0721 Last data filed at 01/03/2022 0604 Gross per 24 hour  Intake 26437.26 ml  Output 25675 ml  Net 762.26 ml    Filed Weights   12/31/21 0500 01/02/22 0432 01/03/22 0500  Weight: 93.5 kg 89 kg 90.9 kg    Examination: General: Acute on chronically ill appearing elderly male lying in bed on mechanical ventilation, in NAD HEENT: ETT, MM  pink/moist, PERRL,  Neuro: Sedated currently issues with agitation over night  CV: s1s2 regular rate and rhythm, no murmur, rubs, or gallops,  PULM:  Clear to ascultation, no increased work o breathing, no added breath sounds  GI: soft, bowel sounds active in all 4 quadrants, non-tender, non-distended, tolerating TF Extremities: warm/dry, no  edema  Skin: no rashes or lesions  Resolved Hospital Problem list   Supratherapeutic INR: Resolved with holding coumadin Severe sepsis  Assessment & Plan:  Acute cholecystitis -Percutaneous drain placement 7/2, appreciate IR, surgery assistance -TTE ok -WBC slightly increased after initiation of stress dose steroids  P: Continue drain per general surgery Monitor drain output IV ceftriaxone to complete 7 days today Continue to monitor CBC and fever curve  Acute Hypoxemic respiratory failure: -Due to volume overload, severe sepsis with shock P: Mentation/agitation remains primary barrier to extubation Demise sedating medications as able Part p.o. Seroquel Continue ventilator support with lung protective strategies  Wean PEEP and FiO2 for sats greater than 90%. Head of bed elevated 30 degrees. Plateau pressures less than 30 cm H20.  Follow intermittent chest x-ray and ABG.   SAT/SBT as tolerated, mentation preclude extubation  Ensure adequate pulmonary hygiene  VAP bundle in place  PAD protocol  Acute renal failure - Improving  -Mixed physiology from sepsis, ATN with hypotension, volume overload P: Renal function continues to improve slowly Follow renal function  Monitor urine output Trend Bmet Avoid nephrotoxins Ensure adequate renal perfusion  IV hydration  Hematuria -Description of blood clot at end of I/O catheter x2 without hematuria with subsequent development of significant gross hematuria. P: 42-year-old resolved when patient is calm but will return of upon any agitation CBI remains in place  Chronic atrial fibrillation on Coumadin --d/c heparin 7/5 after hematuria P: Anticoagulation remains on hold due to hematuria Continuous telemetry  Question thrombus versus unopacified blood within LEFT atrial appendage;  -None seen on 2D TTE  Best Practice (right click and "Reselect all SmartList Selections" daily)   Diet/type: NPO  DVT prophylaxis: hold with  hematuria GI prophylaxis: PPI Lines: N/A Foley:  N/A Code Status:  DNR Last date of multidisciplinary goals of care discussion [See IPAL 7/5]  CRITICAL CARE Performed by: Melvina Pangelinan D. Harris   Total critical care time: 40 minutes  Critical care time was exclusive of separately billable procedures and treating other patients.  Critical care was necessary to treat or prevent imminent or life-threatening deterioration.  Critical care was time spent personally by me on the following activities: development of treatment plan with patient and/or surrogate as well as nursing, discussions with consultants, evaluation of patient's response to treatment, examination of patient, obtaining history from patient or surrogate, ordering and performing treatments and interventions, ordering and review of laboratory studies, ordering and review of radiographic studies, pulse oximetry and re-evaluation of patient's condition.  Annel Zunker D. Kenton Kingfisher, NP-C [facility] Personal contact information can be found on Amion  01/03/2022, 7:21 AM

## 2022-01-03 NOTE — Progress Notes (Addendum)
eLink Physician-Brief Progress Note Patient Name: [name] DOB: January 15, 1946 MRN: [id number]   Date of Service  01/03/2022  HPI/Events of Note  CT Scan of Abdomen and pelvis reveals: 1. There is new low-density fluid in the extraperitoneal pelvis, presacral space and bilateral retroperitoneum. There is also new free air in the extraperitoneal pelvis and questionable new small amount of intraperitoneal free air near the bladder. Findings are concerning for bladder rupture. 2. Foley catheter in the bladder. Hyperdensity throughout the bladder suspicious for bladder hemorrhage. 3. New percutaneous cholecystostomy tube present. 4. Enteric tube tip in the proximal duodenum. 5. New body wall edema. 6. New right lower lobe airspace consolidation with bilateral pleural effusions.  Urology consulted and recommended CT cystogram of abdomen/Pelvis if family desires to be aggressive and would permit urological intervention if indicated.   eICU Interventions  Plan: Will inform PCCM ground team of CT Scan findings.     Intervention Category Major Interventions: Other:  Lenell Antu 01/03/2022, 10:37 PM

## 2022-01-04 ENCOUNTER — Inpatient Hospital Stay (HOSPITAL_COMMUNITY): Payer: Medicare HMO

## 2022-01-04 ENCOUNTER — Encounter (HOSPITAL_COMMUNITY)
Admission: EM | Disposition: A | Discharge status: Home Health | Payer: Self-pay | Source: Home / Self Care | Attending: Student in an Organized Health Care Education/Training Program

## 2022-01-04 ENCOUNTER — Other Ambulatory Visit: Payer: Self-pay

## 2022-01-04 ENCOUNTER — Inpatient Hospital Stay (HOSPITAL_COMMUNITY): Payer: Medicare HMO | Admitting: Anesthesiology

## 2022-01-04 ENCOUNTER — Encounter (HOSPITAL_COMMUNITY): Payer: Self-pay | Admitting: Pulmonary Disease

## 2022-01-04 DIAGNOSIS — S3720XA Unspecified injury of bladder, initial encounter: Secondary | ICD-10-CM

## 2022-01-04 DIAGNOSIS — I2510 Atherosclerotic heart disease of native coronary artery without angina pectoris: Secondary | ICD-10-CM | POA: Diagnosis not present

## 2022-01-04 DIAGNOSIS — Z87891 Personal history of nicotine dependence: Secondary | ICD-10-CM | POA: Diagnosis not present

## 2022-01-04 DIAGNOSIS — I10 Essential (primary) hypertension: Secondary | ICD-10-CM | POA: Diagnosis not present

## 2022-01-04 DIAGNOSIS — N3289 Other specified disorders of bladder: Secondary | ICD-10-CM

## 2022-01-04 DIAGNOSIS — K810 Acute cholecystitis: Secondary | ICD-10-CM | POA: Diagnosis not present

## 2022-01-04 HISTORY — PX: CYSTOSCOPY: SHX5120

## 2022-01-04 HISTORY — PX: LAPAROTOMY: SHX154

## 2022-01-04 LAB — POCT I-STAT 7, (LYTES, BLD GAS, ICA,H+H)
Acid-base deficit: 16 mmol/L — ABNORMAL HIGH (ref 0.0–2.0)
Acid-base deficit: 15 mmol/L — ABNORMAL HIGH (ref 0.0–2.0)
Acid-base deficit: 14 mmol/L — ABNORMAL HIGH (ref 0.0–2.0)
Acid-base deficit: 10 mmol/L — ABNORMAL HIGH (ref 0.0–2.0)
Acid-base deficit: 10 mmol/L — ABNORMAL HIGH (ref 0.0–2.0)
Bicarbonate: 12.2 mmol/L — ABNORMAL LOW (ref 20.0–28.0)
Bicarbonate: 13.6 mmol/L — ABNORMAL LOW (ref 20.0–28.0)
Bicarbonate: 13.8 mmol/L — ABNORMAL LOW (ref 20.0–28.0)
Bicarbonate: 16.4 mmol/L — ABNORMAL LOW (ref 20.0–28.0)
Bicarbonate: 16.2 mmol/L — ABNORMAL LOW (ref 20.0–28.0)
Calcium, Ion: 1.06 mmol/L — ABNORMAL LOW (ref 1.15–1.40)
Calcium, Ion: 0.99 mmol/L — ABNORMAL LOW (ref 1.15–1.40)
Calcium, Ion: 1.12 mmol/L — ABNORMAL LOW (ref 1.15–1.40)
Calcium, Ion: 1 mmol/L — ABNORMAL LOW (ref 1.15–1.40)
Calcium, Ion: 0.98 mmol/L — ABNORMAL LOW (ref 1.15–1.40)
HCT: 26 % — ABNORMAL LOW (ref 39.0–52.0)
HCT: 25 % — ABNORMAL LOW (ref 39.0–52.0)
HCT: 22 % — ABNORMAL LOW (ref 39.0–52.0)
HCT: 18 % — ABNORMAL LOW (ref 39.0–52.0)
HCT: 18 % — ABNORMAL LOW (ref 39.0–52.0)
Hemoglobin: 8.8 g/dL — ABNORMAL LOW (ref 13.0–17.0)
Hemoglobin: 8.5 g/dL — ABNORMAL LOW (ref 13.0–17.0)
Hemoglobin: 7.5 g/dL — ABNORMAL LOW (ref 13.0–17.0)
Hemoglobin: 6.1 g/dL — CL (ref 13.0–17.0)
Hemoglobin: 6.1 g/dL — CL (ref 13.0–17.0)
O2 Saturation: 96 %
O2 Saturation: 91 %
O2 Saturation: 92 %
O2 Saturation: 98 %
O2 Saturation: 99 %
Patient temperature: 97
Patient temperature: 34
Patient temperature: 34.4
Patient temperature: 97.6
Patient temperature: 34.6
Potassium: 4.6 mmol/L (ref 3.5–5.1)
Potassium: 4.8 mmol/L (ref 3.5–5.1)
Potassium: 4.6 mmol/L (ref 3.5–5.1)
Potassium: 4.2 mmol/L (ref 3.5–5.1)
Potassium: 3.7 mmol/L (ref 3.5–5.1)
Sodium: 144 mmol/L (ref 135–145)
Sodium: 144 mmol/L (ref 135–145)
Sodium: 145 mmol/L (ref 135–145)
Sodium: 147 mmol/L — ABNORMAL HIGH (ref 135–145)
Sodium: 147 mmol/L — ABNORMAL HIGH (ref 135–145)
TCO2: 13 mmol/L — ABNORMAL LOW (ref 22–32)
TCO2: 15 mmol/L — ABNORMAL LOW (ref 22–32)
TCO2: 15 mmol/L — ABNORMAL LOW (ref 22–32)
TCO2: 18 mmol/L — ABNORMAL LOW (ref 22–32)
TCO2: 17 mmol/L — ABNORMAL LOW (ref 22–32)
pCO2 arterial: 36.8 mmHg (ref 32–48)
pCO2 arterial: 38.2 mmHg (ref 32–48)
pCO2 arterial: 36.2 mmHg (ref 32–48)
pCO2 arterial: 37.9 mmHg (ref 32–48)
pCO2 arterial: 30.7 mmHg — ABNORMAL LOW (ref 32–48)
pH, Arterial: 7.121 — CL (ref 7.35–7.45)
pH, Arterial: 7.14 — CL (ref 7.35–7.45)
pH, Arterial: 7.171 — CL (ref 7.35–7.45)
pH, Arterial: 7.243 — ABNORMAL LOW (ref 7.35–7.45)
pH, Arterial: 7.319 — ABNORMAL LOW (ref 7.35–7.45)
pO2, Arterial: 108 mmHg (ref 83–108)
pO2, Arterial: 66 mmHg — ABNORMAL LOW (ref 83–108)
pO2, Arterial: 69 mmHg — ABNORMAL LOW (ref 83–108)
pO2, Arterial: 116 mmHg — ABNORMAL HIGH (ref 83–108)
pO2, Arterial: 126 mmHg — ABNORMAL HIGH (ref 83–108)

## 2022-01-04 LAB — GLUCOSE, CAPILLARY
Glucose-Capillary: 71 mg/dL (ref 70–99)
Glucose-Capillary: 48 mg/dL — ABNORMAL LOW (ref 70–99)
Glucose-Capillary: 125 mg/dL — ABNORMAL HIGH (ref 70–99)
Glucose-Capillary: 105 mg/dL — ABNORMAL HIGH (ref 70–99)
Glucose-Capillary: 104 mg/dL — ABNORMAL HIGH (ref 70–99)
Glucose-Capillary: 121 mg/dL — ABNORMAL HIGH (ref 70–99)
Glucose-Capillary: 133 mg/dL — ABNORMAL HIGH (ref 70–99)
Glucose-Capillary: 126 mg/dL — ABNORMAL HIGH (ref 70–99)

## 2022-01-04 LAB — CBC
HCT: 23.9 % — ABNORMAL LOW (ref 39.0–52.0)
HCT: 22.1 % — ABNORMAL LOW (ref 39.0–52.0)
Hemoglobin: 7.7 g/dL — ABNORMAL LOW (ref 13.0–17.0)
Hemoglobin: 7.3 g/dL — ABNORMAL LOW (ref 13.0–17.0)
MCH: 32.2 pg (ref 26.0–34.0)
MCH: 32.3 pg (ref 26.0–34.0)
MCHC: 32.2 g/dL (ref 30.0–36.0)
MCHC: 33 g/dL (ref 30.0–36.0)
MCV: 100 fL (ref 80.0–100.0)
MCV: 97.8 fL (ref 80.0–100.0)
Platelets: 106 10*3/uL — ABNORMAL LOW (ref 150–400)
Platelets: 104 10*3/uL — ABNORMAL LOW (ref 150–400)
RBC: 2.39 MIL/uL — ABNORMAL LOW (ref 4.22–5.81)
RBC: 2.26 MIL/uL — ABNORMAL LOW (ref 4.22–5.81)
RDW: 15.1 % (ref 11.5–15.5)
RDW: 15.4 % (ref 11.5–15.5)
WBC: 22.1 10*3/uL — ABNORMAL HIGH (ref 4.0–10.5)
WBC: 21.9 10*3/uL — ABNORMAL HIGH (ref 4.0–10.5)
nRBC: 0.5 % — ABNORMAL HIGH (ref 0.0–0.2)
nRBC: 1 % — ABNORMAL HIGH (ref 0.0–0.2)

## 2022-01-04 SURGERY — CYSTOSCOPY
Anesthesia: General | Site: Bladder

## 2022-01-04 MED ORDER — SODIUM CHLORIDE 0.9 % IR SOLN
3000.0000 mL | Status: DC
  Administered 2022-01-04 – 2022-01-06 (×15): 3000 mL

## 2022-01-04 MED ORDER — ALBUMIN HUMAN 5 % IV SOLN: INTRAVENOUS | Status: DC | PRN

## 2022-01-04 MED ORDER — CALCIUM GLUCONATE-NACL 1-0.675 GM/50ML-% IV SOLN
1.0000 g | Freq: Once | INTRAVENOUS | Status: AC
  Administered 2022-01-04: 1000 mg via INTRAVENOUS
  Filled 2022-01-04: qty 50

## 2022-01-04 MED ORDER — FENTANYL CITRATE (PF) 250 MCG/5ML IJ SOLN
INTRAMUSCULAR | Status: AC
  Filled 2022-01-04: qty 5

## 2022-01-04 MED ORDER — DEXTROSE 10 % IV SOLN: INTRAVENOUS | Status: DC

## 2022-01-04 MED ORDER — SODIUM BICARBONATE 8.4 % IV SOLN
50.0000 meq | Freq: Once | INTRAVENOUS | Status: AC
  Administered 2022-01-04: 50 meq via INTRAVENOUS
  Filled 2022-01-04: qty 50

## 2022-01-04 MED ORDER — IOHEXOL 300 MG/ML  SOLN
50.0000 mL | Freq: Once | INTRAMUSCULAR | Status: AC | PRN
  Administered 2022-01-04: 50 mL via URETHRAL

## 2022-01-04 MED ORDER — SODIUM CHLORIDE 0.9 % IR SOLN: 3000.0000 mL | Status: DC

## 2022-01-04 MED ORDER — CALCIUM CHLORIDE 10 % IV SOLN
INTRAVENOUS | Status: DC | PRN
  Administered 2022-01-04: 200 mg via INTRAVENOUS
  Administered 2022-01-04: 500 mg via INTRAVENOUS

## 2022-01-04 MED ORDER — MIDAZOLAM HCL 2 MG/2ML IJ SOLN
INTRAMUSCULAR | Status: AC
  Filled 2022-01-04: qty 2

## 2022-01-04 MED ORDER — FUROSEMIDE 10 MG/ML IJ SOLN
80.0000 mg | Freq: Once | INTRAMUSCULAR | Status: AC
  Administered 2022-01-04: 80 mg via INTRAVENOUS
  Filled 2022-01-04: qty 8

## 2022-01-04 MED ORDER — ROCURONIUM BROMIDE 10 MG/ML (PF) SYRINGE
PREFILLED_SYRINGE | INTRAVENOUS | Status: DC | PRN
  Administered 2022-01-04 (×2): 50 mg via INTRAVENOUS

## 2022-01-04 MED ORDER — SODIUM BICARBONATE 8.4 % IV SOLN
INTRAVENOUS | Status: AC
  Filled 2022-01-04: qty 50

## 2022-01-04 MED ORDER — PROPOFOL 10 MG/ML IV BOLUS
INTRAVENOUS | Status: AC
  Filled 2022-01-04: qty 20

## 2022-01-04 MED ORDER — SODIUM BICARBONATE 8.4 % IV SOLN
50.0000 meq | Freq: Once | INTRAVENOUS | Status: AC
  Administered 2022-01-04: 50 meq via INTRAVENOUS

## 2022-01-04 MED ORDER — FENTANYL CITRATE (PF) 250 MCG/5ML IJ SOLN
INTRAMUSCULAR | Status: DC | PRN
  Administered 2022-01-04 (×3): 50 ug via INTRAVENOUS

## 2022-01-04 MED ORDER — SODIUM CHLORIDE 0.9 % IR SOLN
Status: DC | PRN
  Administered 2022-01-04: 3000 mL via INTRAVESICAL

## 2022-01-04 MED ORDER — PHENYLEPHRINE HCL-NACL 20-0.9 MG/250ML-% IV SOLN
INTRAVENOUS | Status: DC | PRN
  Administered 2022-01-04: 25 ug/min via INTRAVENOUS

## 2022-01-04 MED ORDER — DEXTROSE 50 % IV SOLN
INTRAVENOUS | Status: AC
  Filled 2022-01-04: qty 50

## 2022-01-04 MED ORDER — DEXTROSE 50 % IV SOLN
25.0000 g | INTRAVENOUS | Status: AC
  Administered 2022-01-04: 25 g via INTRAVENOUS

## 2022-01-04 MED ORDER — STERILE WATER FOR INJECTION IV SOLN
INTRAVENOUS | Status: DC
  Filled 2022-01-04: qty 150
  Filled 2022-01-04 (×2): qty 1000

## 2022-01-04 MED ORDER — SODIUM BICARBONATE 8.4 % IV SOLN
INTRAVENOUS | Status: DC | PRN
  Administered 2022-01-04: 50 meq via INTRAVENOUS

## 2022-01-04 MED ORDER — 0.9 % SODIUM CHLORIDE (POUR BTL) OPTIME
TOPICAL | Status: DC | PRN
  Administered 2022-01-04: 1000 mL

## 2022-01-04 MED ORDER — MIDAZOLAM HCL 5 MG/5ML IJ SOLN
INTRAMUSCULAR | Status: DC | PRN
  Administered 2022-01-04: 2 mg via INTRAVENOUS

## 2022-01-04 MED ORDER — SODIUM CHLORIDE 0.9 % IV SOLN
1.0000 g | INTRAVENOUS | Status: AC
  Administered 2022-01-04 – 2022-01-11 (×8): 1 g via INTRAVENOUS
  Filled 2022-01-04 (×8): qty 10

## 2022-01-04 SURGICAL SUPPLY — 53 items
ADAPTER CATH URET PLST 4-6FR (CATHETERS) IMPLANT
ADPR CATH URET STRL DISP 4-6FR (CATHETERS)
APL SKNCLS STERI-STRIP NONHPOA (GAUZE/BANDAGES/DRESSINGS)
BAG COUNTER SPONGE SURGICOUNT (BAG) ×6 IMPLANT
BAG SPNG CNTER NS LX DISP (BAG) ×4
BAG URINE DRAINAGE (UROLOGICAL SUPPLIES) ×3 IMPLANT
BAG URO CATCHER STRL LF (MISCELLANEOUS) ×6 IMPLANT
BENZOIN TINCTURE PRP APPL 2/3 (GAUZE/BANDAGES/DRESSINGS) IMPLANT
BLADE CLIPPER SURG (BLADE) IMPLANT
BUCKET BIOHAZARD WASTE 5 GAL (MISCELLANEOUS) ×3 IMPLANT
CATH FOLEY 2WAY SLVR  5CC 16FR (CATHETERS)
CATH FOLEY 2WAY SLVR 5CC 16FR (CATHETERS) IMPLANT
CATH HEMA 3WAY 30CC 24FR COUDE (CATHETERS) ×1 IMPLANT
CATH URET 5FR 28IN CONE TIP (BALLOONS)
CATH URET 5FR 70CM CONE TIP (BALLOONS) IMPLANT
COVER SURGICAL LIGHT HANDLE (MISCELLANEOUS) ×3 IMPLANT
DRAPE CAMERA CLOSED 9X96 (DRAPES) ×6 IMPLANT
DRSG PAD ABDOMINAL 8X10 ST (GAUZE/BANDAGES/DRESSINGS) ×1 IMPLANT
ELECT REM PT RETURN 9FT ADLT (ELECTROSURGICAL)
ELECTRODE REM PT RTRN 9FT ADLT (ELECTROSURGICAL) IMPLANT
GAUZE SPONGE 4X4 12PLY STRL LF (GAUZE/BANDAGES/DRESSINGS) ×1 IMPLANT
GLOVE BIO SURGEON STRL SZ8 (GLOVE) ×3 IMPLANT
GOWN STRL REUS W/ TWL LRG LVL3 (GOWN DISPOSABLE) ×4 IMPLANT
GOWN STRL REUS W/TWL LRG LVL3 (GOWN DISPOSABLE) ×6
GUIDEWIRE COOK  .035 (WIRE) IMPLANT
IV NS IRRIG 3000ML ARTHROMATIC (IV SOLUTION) ×3 IMPLANT
KIT TURNOVER KIT B (KITS) ×6 IMPLANT
MANIFOLD NEPTUNE II (INSTRUMENTS) ×3 IMPLANT
NS IRRIG 1000ML POUR BTL (IV SOLUTION) ×3 IMPLANT
PACK CYSTO (CUSTOM PROCEDURE TRAY) ×3 IMPLANT
PAD ARMBOARD 7.5X6 YLW CONV (MISCELLANEOUS) ×6 IMPLANT
PLUG CATH AND CAP STER (CATHETERS) ×1 IMPLANT
SENSORWIRE 0.038 NOT ANGLED (WIRE) ×3
SET IRRIG Y TYPE TUR BLADDER L (SET/KITS/TRAYS/PACK) ×3 IMPLANT
SOL PREP POV-IOD 4OZ 10% (MISCELLANEOUS) ×3 IMPLANT
SPONGE T-LAP 18X18 ~~LOC~~+RFID (SPONGE) ×2 IMPLANT
STAPLER VISISTAT 35W (STAPLE) ×1 IMPLANT
STENT URET 6FRX24 CONTOUR (STENTS) IMPLANT
SUT ETHILON 3 0 FSL (SUTURE) ×1 IMPLANT
SUT PDS AB 1 TP1 96 (SUTURE) ×1 IMPLANT
SUT SILK 2 0 SH (SUTURE) ×1 IMPLANT
SUT VIC AB 2-0 SH 27 (SUTURE) ×6
SUT VIC AB 2-0 SH 27XBRD (SUTURE) IMPLANT
SUT VIC AB 3-0 SH 27 (SUTURE) ×6
SUT VIC AB 3-0 SH 27X BRD (SUTURE) IMPLANT
SYR CONTROL 10ML LL (SYRINGE) ×3 IMPLANT
SYRINGE TOOMEY DISP (SYRINGE) IMPLANT
TAPE CLOTH 4X10 WHT NS (GAUZE/BANDAGES/DRESSINGS) ×1 IMPLANT
UNDERPAD 30X36 HEAVY ABSORB (UNDERPADS AND DIAPERS) ×3 IMPLANT
WATER STERILE IRR 1000ML POUR (IV SOLUTION) ×3 IMPLANT
WATER STERILE IRR 3000ML UROMA (IV SOLUTION) ×3 IMPLANT
WIRE COONS/BENSON .038X145CM (WIRE) IMPLANT
WIRE SENSOR 0.038 NOT ANGLED (WIRE) IMPLANT

## 2022-01-04 NOTE — Progress Notes (Signed)
[place] Progress Note Patient Name: [name] DOB: Oct 16, 1945 MRN: [id number]   Date of Service  01/04/2022  HPI/Events of Note  ABG on 50%/PS 5/P 5 = 7.12/36/108/12  eICU Interventions  Plan: NaHCO3 100 meq IV now. NaHCO3 IV infusion at 100 mL/hour. Repeat ABG at 10 AM.     Intervention Category Major Interventions: Respiratory failure - evaluation and management;Acid-Base disturbance - evaluation and management  Dalexa Gentz Eugene 01/04/2022, 6:19 AM

## 2022-01-04 NOTE — Progress Notes (Signed)
PCCM:  Patient back from OR.  Seen and assessed at bedside  Case discussed with Dr. Alinda Money.  Check CBC  2 AMPS bicarb  Repeat ABG @ 3pm  1g calcium   This patient is critically ill with multiple organ system failure; which, requires frequent high complexity decision making, assessment, support, evaluation, and titration of therapies. This was completed through the application of advanced monitoring technologies and extensive interpretation of multiple databases. During this encounter critical care time was devoted to patient care services described in this note for 32 minutes.   [facility] 01/04/2022 1:31 PM

## 2022-01-04 NOTE — Progress Notes (Signed)
During my morning assessment, I found the patient's scrotum to be grossly enlarged and swollen. I called Dr. Valeta Harms to the bedside. I clamped the CBI and urology came to the bedside as well. There was clear fluid draining out of the catheter.

## 2022-01-04 NOTE — Op Note (Signed)
Preoperative diagnosis: 1.  Clot retention 2.  Extraperitoneal bladder rupture  Postoperative diagnosis: 1.  Clot retention 2.  Extraperitoneal bladder rupture  Procedures: 1.  Cystoscopy 2.  Attempted transurethral clot evacuation 3.  Exploratory laparotomy with cystotomy evacuation of bladder clot and bladder repair  Surgeons: Pryor Curia MD  Resident: Dr. Trilby Leaver  Anesthesia: General  Complications: None  EBL: 50 cc  Specimens: None  Drains: 19 French pelvic Blake drain and 24 [place] three-way hematuria catheter  Indication: [name] is a 76 year old gentleman who presented to the hospital with a gangrenous cholecystitis requiring percutaneous drainage of his gallbladder.  He was severely ill with sepsis at the time.  He also developed hematuria likely related to misplacement of a urethral catheter in the emergency department along with his anticoagulation for atrial fibrillation.  He developed frank blood and per urology recommendations, had a three-way hematuria catheter placed and was begun on continuous bladder irrigation.  4 days later, a formal urologic consultation was obtained due to concerns regarding a possible extraperitoneal bladder rupture.  The patient was evaluated and indeed appeared to have an anterior extraperitoneal bladder rupture with significant clot in the bladder.  After attempts at the bedside to evacuate the clot were unsuccessful via his hematuria catheter, it is recommended that he proceed to the operating room for formal clot evacuation.  The potential risks, complications, and expected recovery process associated with the above procedures were discussed in detail.  Informed consent was obtained.  Description of procedure: The patient was taken to the operating room and a general anesthetic was administered.  He was given preoperative antibiotics, placed in the dorsolithotomy position, and prepped and draped in usual sterile fashion.   A preoperative timeout was performed.  Cystourethroscopy was then performed with relatively poor visualization of the bladder neck.  He was noted to have an extremely large prostate.  The 84 French resectoscope with the visual obturator was then used to attempt access of the bladder but again was unable to be accessed due to the very large prostate and high bladder neck.  Flexible cystoscopy was then performed and allowed entry into the bladder.  A 0.38 sensor guidewire was left within the bladder and the flexible cystoscope was removed.  The resectoscope was then replaced next to the wire and the wire was followed and the resectoscope was able to be placed into the bladder.  Significant well-formed clot was identified.  Attempts to evacuate through the resectoscope were unsuccessful.  At this point a new 24 [place] three-way hematuria catheter was placed over a wire into the bladder.  Again, attempts were made to evacuate the clot and were unsuccessful.  At this point, it was decided that he would require an exploratory laparotomy with clot evacuation via cystotomy.  A lower midline incision was made and carried down through the skin between the pubic symphysis and below the umbilicus.  This was carried down through the subcutaneous tissues with electrocautery.  There was noted to be significant subcutaneous edema.  The rectus fascia was identified and incised in the midline.  The rectus muscles were separated and the space of Retzius was entered.  A self-retaining Bookwalter retractor was placed.  The space of Retzius was bluntly developed and the bladder was identified.  The Foley catheter was able to be palpated within the bladder in the appropriate position.  The bladder was then entered with electrocautery anteriorly and a vertical incision.  There was a significant amount of  formed clot that was manually evacuated from the bladder.  It was estimated that approximately 500 cc of blood may have been involved  with this clot.  No active bleeding was identified to any significance.  There appeared to be a slightly thinned out area toward the anterior right side of the bladder that was likely the area of extraperitoneal perforation.  The bladder opening was then closed with a 3-0 Vicryl mucosal layer.  A 2-0 Vicryl imbricating layer was then used to close over the mucosal layer and incorporated the thinned area that was felt to be the point of rupture.  An additional 2-0 Vicryl imbricating layer was also then performed.  A #19 Blake drain was brought through a separate stab incision in the left lower quadrant and placed into the pelvis.  It was secured to the skin with a nylon suture.  The rectus fascia was then closed with 2 separate running #1 PDS sutures.  The skin was reapproximated with staples and a sterile dressing was applied.  The patient tolerated the procedure well without complications.  He was transferred back to the ICU intubated for further care.

## 2022-01-04 NOTE — Progress Notes (Signed)
PCCM:  I called the patients wife. She is aware of the bladder rupture. I answered all of her questions. She was thankful for the care we have given and wants to remain hopeful that he will get better and pull through this too.   Garner Nash, DO [facility] 01/04/2022 9:45 AM

## 2022-01-04 NOTE — Consult Note (Signed)
Urology Consult Note   Requesting Attending Physician:  Garner Nash, DO Service Providing Consult: Urology  Consulting Attending: Dr. Alinda Money   Reason for Consult:  bladder perforation  HPI: [name] is seen in consultation for reasons noted above at the request of Icard, [place], DO  for evaluation of extraperitoneal bladder perforation.  This is a 76 y.o. male with past medical history of aortic stenosis, HTN, GERD, afib on coumadin, HLD, who initially presented with acute cholecystitis with sepsis. He was intubated and a catheter was placed on 12/31/21 with hematuria with clots. A 24Fr catheter was placed and CBI was started. 01/03/22 his creatinine began to worsen and he was increasingly agitated when being weaned from the ventilator and so a CT scan was obtained which showed findings concerning for bladder perforation. A CT cystogram was then performed confirming an extraperitoneal bladder injury with significant intravesical clot. Catheter was assessed at bedside and unable to express any clot burden and catheter was draining poorly.   Past Medical History: Past Medical History:  Diagnosis Date   Allergic rhinitis 08/08/2013   takes Loratadine daily    Aortic stenosis 09/08/2011   With mild aortic regurgitation, mean gradient 13 mmHg    Cataract    right but immature   Complication of anesthesia    stopped breathing - during shoulder surgery 2009-2010   Coronary artery disease 10/02/2009   Cardiac cath (October 2007): 20% left main, 60% mid LAD, 60-70% distal LAD, 30-40% first diagonal, 30% circumflex, 40% OM, 30-40% RCA    Degenerative joint disease of shoulder 08/03/2013   Bilateral, s/p right total shoulder arthroplasty 05/29/2011 and left shoulder arthroplasty 60/73/7106   Diastolic dysfunction 26/02/4853   Echo (04/04/2016): Grade I   Diverticulosis 10/07/2013   Seen on colonoscopy in 2007    Erectile dysfunction 05/07/2009   Essential hypertension 08/03/2013   had  been on Lisinopril but stopped per MD   First degree AV block 03/14/2016   Gastric ulcer 10/07/2013   Seen on EGD 04/10/2006    Hemorrhoids 10/07/2013   Hyperlipidemia 10/02/2009   Paroxysmal atrial fibrillation ([place]) 07/14/2006   One episode, provoked by alcohol use disorder, briefly anticoagulated with warfarin, then switched to aspirin alone   Sciatica associated with disorder of lumbar spine 08/03/2013   Anterolisthesis with right L 4-5 nerve root compression.  Treated with epidural injections and Physical Therapy.    Seborrheic keratosis 10/07/2013   Tubular adenoma of colon     Past Surgical History:  Past Surgical History:  Procedure Laterality Date   CARDIAC CATHETERIZATION  2007   CARDIOVERSION     CARDIOVERSION N/A 08/04/2019   Procedure: CARDIOVERSION;  Surgeon: Fay Records, MD;  Location: [facility];  Service: Cardiovascular;  Laterality: N/A;   COLONOSCOPY     INSERTION OF MESH N/A 03/11/2019   Procedure: Insertion Of Mesh;  Surgeon: Ralene Ok, MD;  Location: [facility];  Service: General;  Laterality: N/A;   IR PERC CHOLECYSTOSTOMY  12/29/2021   JOINT REPLACEMENT     KNEE ARTHROSCOPY WITH MENISCAL REPAIR Right 01/30/2011   right finger surgery     as a child   TONSILLECTOMY     TOTAL KNEE ARTHROPLASTY Left 07/28/2017   Procedure: LEFT TOTAL KNEE ARTHROPLASTY;  Surgeon: Renette Butters, MD;  Location: [facility];  Service: Orthopedics;  Laterality: Left;   TOTAL SHOULDER ARTHROPLASTY  05/29/2011   Procedure: TOTAL SHOULDER ARTHROPLASTY;  Surgeon: Metta Clines Supple;  Location: [facility];  Service: Orthopedics;  Laterality: Right;   TOTAL SHOULDER ARTHROPLASTY Left 06/08/2014   DR SUPPLE   TOTAL SHOULDER ARTHROPLASTY Left 06/08/2014   Procedure: LEFT TOTAL SHOULDER ARTHROPLASTY;  Surgeon: Marin Shutter, MD;  Location: [facility];  Service: Orthopedics;  Laterality: Left;   UMBILICAL HERNIA REPAIR N/A 03/11/2019   Procedure: LAPAROSCOPIC UMBILICAL HERNIA;  Surgeon: Ralene Ok, MD;   Location: [facility];  Service: General;  Laterality: N/A;    Medication: Current Facility-Administered Medications  Medication Dose Route Frequency Provider Last Rate Last Admin   0.9 %  sodium chloride infusion  250 mL Intravenous Continuous Icard, Bradley L, DO   Stopped at 01/04/22 0505   0.9 %  sodium chloride infusion   Intra-arterial PRN Hunsucker, Bonna Gains, MD       cefTRIAXone (ROCEPHIN) 1 g in sodium chloride 0.9 % 100 mL IVPB  1 g Intravenous Q24H Icard, Bradley L, DO 200 mL/hr at 01/04/22 0847 1 g at 01/04/22 0847   Chlorhexidine Gluconate Cloth 2 % PADS 6 each  6 each Topical Daily Icard, Bradley L, DO   6 each at 01/03/22 1955   clonazePAM (KLONOPIN) tablet 0.5 mg  0.5 mg Per Tube BID Gerald Leitz D, NP   0.5 mg at 01/04/22 0116   dexmedetomidine (PRECEDEX) 400 MCG/100ML (4 mcg/mL) infusion  0.4-1.2 mcg/kg/hr Intravenous Titrated Gerald Leitz D, NP 11.13 mL/hr at 01/04/22 0600 0.5 mcg/kg/hr at 01/04/22 0600   dextrose 10 % infusion   Intravenous Continuous Anders Simmonds, MD 40 mL/hr at 01/04/22 0600 Infusion Verify at 01/04/22 0600   docusate (COLACE) 50 MG/5ML liquid 100 mg  100 mg Per Tube BID PRN Henri Medal, RPH       feeding supplement (OSMOLITE 1.5 CAL) liquid 1,000 mL  1,000 mL Per Tube Continuous Gerald Leitz D, NP   Stopped at 01/03/22 1830   feeding supplement (PROSource TF) liquid 45 mL  45 mL Per Tube TID Gerald Leitz D, NP   45 mL at 01/04/22 0115   fentaNYL (SUBLIMAZE) bolus via infusion 100 mcg  100 mcg Intravenous Q15 min PRN Hunsucker, Bonna Gains, MD   100 mcg at 01/03/22 1925   fentaNYL 2561mg in NS 2541m(1047mml) infusion-PREMIX  0-400 mcg/hr Intravenous Continuous SomAnders SimmondsD 17.5 mL/hr at 01/04/22 0600 175 mcg/hr at 01/04/22 0600   insulin aspart (novoLOG) injection 0-15 Units  0-15 Units Subcutaneous Q4H HarGerald Leitz NP   3 Units at 01/03/22 1943   midazolam (VERSED) injection 1 mg  1 mg Intravenous Q1H PRN SomAnders SimmondsD   1  mg at 01/04/22 0803   ondansetron (ZOFRAN) injection 4 mg  4 mg Intravenous Q6H PRN Icard, BraOctavio GravesO       Oral care mouth rinse  15 mL Mouth Rinse Q2H SmiCandee FurbishD   15 mL at 01/04/22 0831   Oral care mouth rinse  15 mL Mouth Rinse PRN SmiCandee FurbishD       pantoprazole sodium (PROTONIX) 40 mg/20 mL oral suspension 40 mg  40 mg Per Tube QHS Hunsucker, MatBonna GainsD   40 mg at 01/04/22 0116   polyethylene glycol (MIRALAX / GLYCOLAX) packet 17 g  17 g Per Tube Daily PRN Hunsucker, MatBonna GainsD       polyethylene glycol (MIRALAX / GLYCOLAX) packet 17 g  17 g Per Tube Daily Hunsucker, MatBonna GainsD   17 g at 01/02/22 1006   senna-docusate (Senokot-S) tablet  1 tablet  1 tablet Per Tube QHS Hunsucker, Bonna Gains, MD   1 tablet at 01/02/22 2123   sodium bicarbonate 150 mEq in sterile water 1,150 mL infusion   Intravenous Continuous Anders Simmonds, MD 100 mL/hr at 01/04/22 0751 New Bag at 01/04/22 0751   sodium chloride flush (NS) 0.9 % injection 10-40 mL  10-40 mL Intracatheter Q12H Candee Furbish, MD   10 mL at 01/03/22 2200   sodium chloride flush (NS) 0.9 % injection 10-40 mL  10-40 mL Intracatheter PRN Candee Furbish, MD       sodium chloride flush (NS) 0.9 % injection 5 mL  5 mL Intracatheter Q8H Criselda Peaches, MD   5 mL at 01/04/22 3149   sodium chloride irrigation 0.9 % 3,000 mL  3,000 mL Irrigation Continuous Elsie Lincoln, MD   3,000 mL at 01/04/22 7026    Allergies: No Known Allergies  Social History: Social History   Tobacco Use   Smoking status: Former    Packs/day: 1.50    Years: 42.00    Total pack years: 63.00    Types: Cigarettes    Start date: 30    Quit date: 07/14/2010    Years since quitting: 11.4   Smokeless tobacco: Never   Tobacco comments:    quit smoking about 42yr ago  Vaping Use   Vaping Use: Never used  Substance Use Topics   Alcohol use: No    Comment: no alcohol since 07   Drug use: No    Family History Family History   Problem Relation Age of Onset   Coronary artery disease Father 664      Died of myocardial infarction   Bradycardia Mother 920      Requiring pacemeaker placement   Coronary artery disease Brother 521      Died of myocardial infarction   Obesity Daughter    Healthy Son     Review of Systems 10 systems were reviewed and are negative except as noted specifically in the HPI.  Objective   Vital signs in last 24 hours: BP 123/76   Pulse 76   Temp 97.6 F (36.4 C) (Axillary)   Resp 19   Ht '5\' 6"'$  (1.676 m)   Wt 90.9 kg   SpO2 94%   BMI 32.35 kg/m   Physical Exam General: Intubated, uncomfortable Pulmonary: symmetric chest rise on ventilator Cardiovascular: HDS, adequate peripheral perfusion Abdomen: Soft, diffusely distended GU: Significant scrotal and penile edema, catheter in place without drainage  Extremities: warm and well perfused Neuro: Sedated, spontaneous movement to pain  Most Recent Labs: Lab Results  Component Value Date   WBC 22.1 (H) 01/04/2022   HGB 8.8 (L) 01/04/2022   HCT 26.0 (L) 01/04/2022   PLT 106 (L) 01/04/2022    Lab Results  Component Value Date   NA 144 01/04/2022   K 4.6 01/04/2022   CL 111 01/03/2022   CO2 17 (L) 01/03/2022   BUN 89 (H) 01/03/2022   CREATININE 3.07 (H) 01/03/2022   CALCIUM 8.0 (L) 01/03/2022   MG 2.4 01/03/2022   PHOS 6.0 (H) 01/03/2022    Lab Results  Component Value Date   INR 1.9 (H) 12/31/2021   APTT 30 05/31/2014     Urine Culture: '@[id number]'$ (laburin,org,[id number],[id number])@   IMAGING: CT CYSTOGRAM ABD/PELVIS  Result Date: 01/04/2022 CLINICAL DATA:  Suspected bladder rupture on prior CT examination EXAM: CT CYSTOGRAM (CT ABDOMEN AND PELVIS WITH CONTRAST) TECHNIQUE: Multi-detector CT imaging  through the abdomen and pelvis was performed after dilute contrast had been introduced into the bladder for the purposes of performing CT cystography. RADIATION DOSE REDUCTION: This exam was performed according to the  departmental dose-optimization program which includes automated exposure control, adjustment of the mA and/or kV according to patient size and/or use of iterative reconstruction technique. CONTRAST:  75m OMNIPAQUE IOHEXOL 300 MG/ML  SOLN COMPARISON:  01/03/2022 FINDINGS: Lower chest: Small pleural effusions are noted bilaterally. Hepatobiliary: Visualized liver is within normal limits. Cholecystostomy tube is noted in place. Pancreas: Pancreas is within normal limits. A duodenal diverticulum is noted adjacent to the head of the pancreas. Spleen: Visualized portions of the spleen are within normal limits. Adrenals/Urinary Tract: Adrenal glands are unremarkable. Kidneys are well visualized bilaterally. Punctate upper pole stone is again seen on the right. No obstructive changes are seen. The bladder demonstrates multiple filling defects consistent with thrombus similar to that seen on prior examination. Contrast material has been instilled into the bladder and there is significant irregularity along the anterior/inferior aspect of the bladder with evidence of bladder rupture and extension of contrast material into the adjacent extraperitoneal and retroperitoneal spaces. Air is noted as well in the surrounding soft tissues anteriorly related to the recent administration of contrast. Extension into the abdominal musculature anteriorly is noted as well. The fluid in the retroperitoneum all extending along the posterior aspect of Gerota's fascia bilaterally shows some very mild contrast enhancement on delayed images. It is likely the fluid surrounding gross fashion more superiorly represents urine as well. Contrast extends on delayed images into the right inguinal canal related to a fat containing inguinal hernia. Stomach/Bowel: Visualized bowel shows previously administered contrast material. Diverticular change of the colon is noted without diverticulitis. Gastric catheter is noted within the stomach.  Vascular/Lymphatic: Diffuse vascular calcifications are noted. No adenopathy is seen. Reproductive: Prostate is within normal limits. Other: Mild surrounding edema is noted stable from the prior exam. Musculoskeletal: No acute or significant osseous findings. IMPRESSION: Changes consistent with bladder rupture along the anterior inferior aspect of the bladder wall with extraperitoneal extravasation of contrast material which extends superiorly along Gerota's fascia as well as in the anterior abdominal musculature. Extension into the right containing inguinal hernia is noted as well. Electronically Signed   By: MInez CatalinaM.D.   On: 01/04/2022 03:15   CT ABDOMEN PELVIS WO CONTRAST  Result Date: 01/03/2022 CLINICAL DATA:  Acute abdominal pain. History of bladder irrigations. EXAM: CT ABDOMEN AND PELVIS WITHOUT CONTRAST TECHNIQUE: Multidetector CT imaging of the abdomen and pelvis was performed following the standard protocol without IV contrast. RADIATION DOSE REDUCTION: This exam was performed according to the departmental dose-optimization program which includes automated exposure control, adjustment of the mA and/or kV according to patient size and/or use of iterative reconstruction technique. COMPARISON:  CT angiogram chest abdomen and pelvis 12/28/2021 FINDINGS: Lower chest: There is new right lower lobe airspace consolidation. There are new small bilateral pleural effusions. There is new atelectasis in the left lung base. The heart is enlarged, unchanged. Hepatobiliary: There is a new percutaneous cholecystostomy tube. The gallbladder is decompressed. No biliary ductal dilatation. Liver is within normal limits. Pancreas: Unremarkable. No pancreatic ductal dilatation or surrounding inflammatory changes. Spleen: Normal in size without focal abnormality. Adrenals/Urinary Tract: There is a stable punctate calculus in the right kidney. Otherwise, kidneys and adrenal glands are within normal limits. There is  hyperdensity and air within the bladder. Foley catheter is present. Stomach/Bowel: There is no evidence for  bowel obstruction. Enteric tube tip is in the first portion of the duodenum. No dilated bowel loops are seen. There is sigmoid colon diverticulosis. There is no focal wall thickening or inflammation identified. Vascular/Lymphatic: There are severe atherosclerotic calcifications of the aorta and iliac arteries. Aorta and IVC are normal in size. No enlarged lymph nodes. Reproductive: Prostatomegaly is unchanged. Other: There is new low-attenuation fluid throughout the retroperitoneum tracking into the pelvis. There is also new low density fluid in the extraperitoneal pelvic spaces. There is presacral edema. There is also small amount of free air in the pelvis which is new from the prior examination. The majority appears extraperitoneal in location. Some may be intraperitoneal near the level of the umbilicus. There is new small volume ascites in the 4 abdominal quadrants. No abdominal wall hernia. New diffuse body wall edema. Musculoskeletal: Degenerative changes affect the spine. IMPRESSION: 1. There is new low-density fluid in the extraperitoneal pelvis, presacral space and bilateral retroperitoneum. There is also new free air in the extraperitoneal pelvis and questionable new small amount of intraperitoneal free air near the bladder. Findings are concerning for bladder rupture. 2. Foley catheter in the bladder. Hyperdensity throughout the bladder suspicious for bladder hemorrhage. 3. New percutaneous cholecystostomy tube present. 4. Enteric tube tip in the proximal duodenum. 5. New body wall edema. 6. New right lower lobe airspace consolidation with bilateral pleural effusions. These results were called by telephone at the time of interpretation on 01/03/2022 at 10:31 pm to provider Dr. Oletta Darter, who verbally acknowledged these results. Electronically Signed   By: Ronney Asters M.D.   On: 01/03/2022 22:33   DG  CHEST PORT 1 VIEW  Result Date: 01/03/2022 CLINICAL DATA:  Provided history: Hypoxia. EXAM: PORTABLE CHEST 1 VIEW COMPARISON:  Prior chest radiographs 12/30/2021 FINDINGS: ET tube present with tip terminating at the level of the clavicular heads. Unchanged position of a left IJ approach central venous catheter with tip projecting at the level of the lower SVC. An enteric tube passes below the level of the left hemidiaphragm with tip excluded from the field of view. A cholecystostomy tube projects in the region of the right upper quadrant of the abdomen. The cardiomediastinal silhouette is unchanged. Aortic atherosclerosis. Persistent opacity within the right mid to lower lung field compatible with a pleural effusion and underlying atelectasis and/or consolidation. No appreciable airspace consolidation within the left lung. No evidence of pneumothorax. IMPRESSION: Support apparatus, as described. Persistent opacity within the right mid-to-lower lung field compatible with a pleural effusion and underlying atelectasis and/or consolidation. Aortic Atherosclerosis (ICD10-I70.0). Electronically Signed   By: Kellie Simmering D.O.   On: 01/03/2022 08:35    ------  Assessment:  76 y.o. male with extraperitoneal bladder perforation and intravesical clot burden with poorly draining catheter,unable to re-establish flow at bedside  Recommendations: - Plan for OR today for cystoscopy with clot evacuation and catheter replacement, possible bladder repair pending evaluation of repair - STOP CBI - agree with restarting antibiotics   Thank you for this consult. Please contact the urology consult pager with any further questions/concerns.

## 2022-01-04 NOTE — Progress Notes (Signed)
RT obtained ABG and reported to MD '@1507'$ . ABG as follows: 7.24/37.9/116/16.4. No new orders for RT at this time. RN aware. RT will continue to monitor.

## 2022-01-04 NOTE — Progress Notes (Signed)
PT taken to CT at 0200 on vent. PT returned to 3M03 at 0300. No complications. Pt on PSV/CPAP tolerating well.

## 2022-01-04 NOTE — Progress Notes (Signed)
[facility] Progress Note Patient Name: [name] DOB: [birth year] MRN: [id number]   Date of Service  01/04/2022  HPI/Events of Note  CT cystogram of [place] and Pelvis findings: Changes consistent with bladder rupture along the anterior inferior aspect of the bladder wall with extraperitoneal extravasation of contrast material which extends superiorly along Gerota's fascia as well as in the anterior abdominal musculature. Extension into the right containing inguinal hernia is noted as well. Family wishes to proceed with aggressive care including urologic intervention.  eICU Interventions  Will re-consult Urology.     Intervention Category Major Interventions: Other:  Crystallynn Noorani Cornelia Copa 01/04/2022, 3:26 AM

## 2022-01-04 NOTE — Transfer of Care (Signed)
Immediate Anesthesia Transfer of Care Note  Patient: [name]  Procedure(s) Performed: CYSTOSCOPY (Bladder) EXPLORATORY LAPAROTOMY WITH EXTRAPERITONEAL BLADDER REPAIR (Abdomen)  Patient Location: ICU  Anesthesia Type:General  Level of Consciousness: Patient remains intubated per anesthesia plan  Airway & Oxygen Therapy: Patient remains intubated per anesthesia plan and Patient placed on Ventilator (see vital sign flow sheet for setting)  Post-op Assessment: Report given to RN and Post -op Vital signs reviewed and stable  Post vital signs: Reviewed and stable  Last Vitals:  Vitals Value Taken Time  BP 116/58   Temp    Pulse 78   Resp 16   SpO2 98     Last Pain:  Vitals:   01/04/22 0700  TempSrc: Axillary  PainSc:          Complications: No notable events documented.

## 2022-01-04 NOTE — Anesthesia Procedure Notes (Addendum)
Arterial Line Insertion Start/End7/01/2022 10:49 AM, 01/04/2022 10:59 AM Performed by: Audry Pili, MD, anesthesiologist  Patient location: Pre-op. Preanesthetic checklist: patient identified, IV checked, risks and benefits discussed, surgical consent, monitors and equipment checked, pre-op evaluation, timeout performed and anesthesia consent Patient sedated Left, radial was placed Catheter size: 20 G Hand hygiene performed   Attempts: 2 Procedure performed using ultrasound guided technique. Ultrasound Notes:anatomy identified, needle tip was noted to be adjacent to the nerve/plexus identified and no ultrasound evidence of intravascular and/or intraneural injection Following insertion, dressing applied and Biopatch. Post procedure assessment: unchanged and normal  Patient tolerated the procedure well with no immediate complications.

## 2022-01-04 NOTE — Progress Notes (Signed)
NAME:  [name], MRN:  [id number], DOB:  22-Jul-1945, LOS: 7 ADMISSION DATE:  12/28/2021, CONSULTATION DATE: 12/29/2018. REFERRING MD: ER physician, CHIEF COMPLAINT: Sepsis  History of Present Illness:  76 year old male who presents to the hospital several days general malaise, fever, abdominal pain more focused in the right side.  He is notable for admission for supratherapeutic INR which he takes Coumadin for atrial fibrillation.  CT scan of the abdomen demonstrated enlarged gallbladder consistent with cholecystitis and question thrombus versus unopacified blood within LEFT atrial appendage;   Pertinent  Medical History   Past Medical History:  Diagnosis Date   Allergic rhinitis 08/08/2013   takes Loratadine daily    Aortic stenosis 09/08/2011   With mild aortic regurgitation, mean gradient 13 mmHg    Cataract    right but immature   Complication of anesthesia    stopped breathing - during shoulder surgery 2009-2010   Coronary artery disease 10/02/2009   Cardiac cath (October 2007): 20% left main, 60% mid LAD, 60-70% distal LAD, 30-40% first diagonal, 30% circumflex, 40% OM, 30-40% RCA    Degenerative joint disease of shoulder 08/03/2013   Bilateral, s/p right total shoulder arthroplasty 05/29/2011 and left shoulder arthroplasty 70/17/7939   Diastolic dysfunction 03/0/0923   Echo (04/04/2016): Grade I   Diverticulosis 10/07/2013   Seen on colonoscopy in 2007    Erectile dysfunction 05/07/2009   Essential hypertension 08/03/2013   had been on Lisinopril but stopped per MD   First degree AV block 03/14/2016   Gastric ulcer 10/07/2013   Seen on EGD 04/10/2006    Hemorrhoids 10/07/2013   Hyperlipidemia 10/02/2009   Paroxysmal atrial fibrillation ([place]) 07/14/2006   One episode, provoked by alcohol use disorder, briefly anticoagulated with warfarin, then switched to aspirin alone   Sciatica associated with disorder of lumbar spine 08/03/2013   Anterolisthesis with right L 4-5 nerve root  compression.  Treated with epidural injections and Physical Therapy.    Seborrheic keratosis 10/07/2013   Tubular adenoma of colon    Significant Hospital Events: Including procedures, antibiotic start and stop dates in addition to other pertinent events   12/28/2021 admitted for cholecystitis 7/2 intubated for rapid respiratory compromise 7/3 pressor requirement increase, volume overloaded 7/4 largely unchanged compared to day prior, creatinine increasing, trial of Lasix 7/6 Remains on CBI overnight with improvement of pressors needs after initiation of stress dose steroids  7/7 after discontinuation of propofol infusion agitation a has remained intermittent issue.  When patient gets agitated and combative gross hematuria is again observed.  Interim History / Subjective:   Critically ill. Intubated on life support.   Objective   Blood pressure 123/76, pulse 76, temperature 97.6 F (36.4 C), temperature source Axillary, resp. rate 19, height '5\' 6"'$  (1.676 m), weight 90.9 kg, SpO2 94 %.    Vent Mode: CPAP;PSV FiO2 (%):  [40 %-50 %] 50 % Set Rate:  [18 bmp] 18 bmp Vt Set:  [510 mL] 510 mL PEEP:  [5 cmH20] 5 cmH20 Pressure Support:  [5 cmH20] 5 cmH20 Plateau Pressure:  [25 cmH20] 25 cmH20   Intake/Output Summary (Last 24 hours) at 01/04/2022 0800 Last data filed at 01/04/2022 0600 Gross per 24 hour  Intake 2055.72 ml  Output 17775 ml  Net -15719.28 ml   Filed Weights   12/31/21 0500 01/02/22 0432 01/03/22 0500  Weight: 93.5 kg 89 kg 90.9 kg    Examination: General: Critically ill, intubated on life support.  HEENT: ETT in place, on mech vent  Neuro: alert to voice, not following commands  CV: RRR, s1 s2  PULM:  BL vented breaths  GI: distended  Extremities: trace edema  Skin: no rash   Resolved Hospital Problem list   Supratherapeutic INR: Resolved with holding coumadin Severe sepsis  Assessment & Plan:   Abdominal Distension  Ruptured Bladder  Hematuria  P: Patient  was on CBI, this was stopped CT was ordered yesterday and completed in the middle of the night. We appreciate the night ground team, Elink and Urology addressing the CT findings concerning for bladder rupture.  Urology cart at bedside this AM for intervention planned by urology this morning. We appreciate their assistance.   Acute cholecystitis -Percutaneous drain placement 7/2, appreciate IR, surgery assistance -TTE ok -WBC slightly increased after initiation of stress dose steroids  P: Continue ceftriaxone, in the setting of bladder rupture and cholecytitis   Acute Hypoxemic respiratory failure: -Due to volume overload, severe sepsis with shock P: PAD guideline line sedation  Once we get the bladder issues undercontrol I think we will be able to help his pain control and mental status  VAP bundle  He will need diuresis   Acute renal failure - Improving, initially presenting with sepsis  -Mixed physiology from sepsis, ATN with hypotension, volume overload - recent rise in Serum Creatinine, ?pseudo-azotemia  P: Patients recent rise in SCR could be from urine loss in to peritoneal cavity due to leak and not true renal failure   Chronic atrial fibrillation on Coumadin --d/c heparin 7/5 after hematuria P: Holding AC d/t hematuria  Question thrombus versus unopacified blood within LEFT atrial appendage;  -None seen on 2D TTE  Best Practice (right click and "Reselect all SmartList Selections" daily)   Diet/type: NPO  DVT prophylaxis: hold with hematuria GI prophylaxis: PPI Lines: N/A Foley:  N/A Code Status:  DNR Last date of multidisciplinary goals of care discussion [See IPAL 7/5]  This patient is critically ill with multiple organ system failure; which, requires frequent high complexity decision making, assessment, support, evaluation, and titration of therapies. This was completed through the application of advanced monitoring technologies and extensive interpretation of  multiple databases. During this encounter critical care time was devoted to patient care services described in this note for 45 minutes.  Garner Nash, DO [facility] 01/04/2022 8:00 AM

## 2022-01-04 NOTE — Progress Notes (Signed)
[facility] Progress Note Patient Name: [name] DOB: 08-Jul-1945 MRN: [id number]   Date of Service  01/04/2022  HPI/Events of Note  ABG 7.32/31/126 Vent 16/510/60%/5 PEEP  eICU Interventions  On bicarbonate drip     Intervention Category Major Interventions: Acid-Base disturbance - evaluation and management  Judd Lien 01/04/2022, 10:24 PM

## 2022-01-04 NOTE — Anesthesia Preprocedure Evaluation (Addendum)
Anesthesia Evaluation  Patient identified by MRN, date of birth, ID band Patient unresponsive    Reviewed: Allergy & Precautions, NPO status , Patient's Chart, lab work & pertinent test results, reviewed documented beta blocker date and time , Unable to perform ROS - Chart review only  History of Anesthesia Complications Negative for: history of anesthetic complications  Airway Mallampati: Intubated       Dental   Pulmonary former smoker,   Intubated     + decreased breath sounds  + intubated    Cardiovascular hypertension, Pt. on medications and Pt. on home beta blockers + CAD  Normal cardiovascular exam+ dysrhythmias Atrial Fibrillation + Valvular Problems/Murmurs AS    '23 TTE - EF 60 to 65%. Left atrial size was moderately dilated. Mild mitral valve regurgitation. Aortic valve regurgitation is mild. Moderate aortic valve stenosis.     Neuro/Psych    GI/Hepatic PUD, (+)     substance abuse  alcohol use,   Endo/Other   Obesity Pre-DM   Renal/GU Renal InsufficiencyRenal disease    Perforated bladder     Musculoskeletal  (+) Arthritis ,  Gout    Abdominal   Peds  Hematology  (+) Blood dyscrasia, anemia ,  On coumadin CBC      Component                Value               Date/Time                 WBC                      22.1 (H)            01/04/2022 0439           HGB                      8.8 (L)             01/04/2022 0602           HCT                      26.0 (L)            01/04/2022 0602           PLT                      106 (L)             01/04/2022 0439             Anesthesia Other Findings   Reproductive/Obstetrics                           Anesthesia Physical Anesthesia Plan  ASA: 4 and emergent  Anesthesia Plan: General   Post-op Pain Management:    Induction: Inhalational  PONV Risk Score and Plan: 2 and Treatment may vary due to age or medical  condition  Airway Management Planned: Oral ETT  Additional Equipment: Arterial line  Intra-op Plan:   Post-operative Plan: Post-operative intubation/ventilation  Informed Consent:   Patient has DNR.  Suspend DNR.   History available from chart only  Plan Discussed with: CRNA and Anesthesiologist  Anesthesia Plan Comments:      Anesthesia Quick Evaluation

## 2022-01-04 NOTE — Progress Notes (Signed)
RT transported patient from OR back to 3M03 with RN and CRNA. No complications. RT will continue to monitor.

## 2022-01-04 NOTE — Progress Notes (Signed)
[facility] Progress Note Patient Name: ZALE MARCOTTE DOB: April 25, 1946 MRN: [id number]   Date of Service  01/04/2022  HPI/Events of Note  Multiple issues: 1. Hypoglycemia - TF now held. Blood glucose -71.  eICU Interventions  Plan: D10W IV infusion at 40 mL/hour. NPO.     Intervention Category Major Interventions: Other:  Lysle Dingwall 01/04/2022, 4:38 AM

## 2022-01-04 NOTE — Anesthesia Postprocedure Evaluation (Signed)
Anesthesia Post Note  Patient: DOLORES MCGOVERN  Procedure(s) Performed: CYSTOSCOPY (Bladder) EXPLORATORY LAPAROTOMY WITH EXTRAPERITONEAL BLADDER REPAIR (Abdomen)     Patient location during evaluation: ICU Anesthesia Type: General Level of consciousness: sedated and patient remains intubated per anesthesia plan Pain management: pain level controlled Vital Signs Assessment: post-procedure vital signs reviewed and stable Respiratory status: patient remains intubated per anesthesia plan Cardiovascular status: stable Postop Assessment: no apparent nausea or vomiting Anesthetic complications: no   No notable events documented.  Last Vitals:  Vitals:   01/04/22 1015 01/04/22 1030  BP: 99/71 96/61  Pulse: 67 65  Resp: (!) 9 (!) 9  Temp:    SpO2: 100% 98%    Last Pain:  Vitals:   01/04/22 0700  TempSrc: Axillary  PainSc:                  Audry Pili

## 2022-01-05 ENCOUNTER — Encounter (HOSPITAL_COMMUNITY): Payer: Self-pay | Admitting: Urology

## 2022-01-05 DIAGNOSIS — K810 Acute cholecystitis: Secondary | ICD-10-CM | POA: Diagnosis not present

## 2022-01-05 LAB — MAGNESIUM: Magnesium: 1.5 mg/dL — ABNORMAL LOW (ref 1.7–2.4)

## 2022-01-05 LAB — BASIC METABOLIC PANEL
Anion gap: 9 (ref 5–15)
BUN: 73 mg/dL — ABNORMAL HIGH (ref 8–23)
CO2: 20 mmol/L — ABNORMAL LOW (ref 22–32)
Calcium: 6.4 mg/dL — CL (ref 8.9–10.3)
Chloride: 116 mmol/L — ABNORMAL HIGH (ref 98–111)
Creatinine, Ser: 2.26 mg/dL — ABNORMAL HIGH (ref 0.61–1.24)
GFR, Estimated: 30 mL/min — ABNORMAL LOW (ref >60)
Glucose, Bld: 97 mg/dL (ref 70–99)
Potassium: 3.6 mmol/L (ref 3.5–5.1)
Sodium: 145 mmol/L (ref 135–145)

## 2022-01-05 LAB — CBC
HCT: 22.9 % — ABNORMAL LOW (ref 39.0–52.0)
Hemoglobin: 7.6 g/dL — ABNORMAL LOW (ref 13.0–17.0)
MCH: 32.1 pg (ref 26.0–34.0)
MCHC: 33.2 g/dL (ref 30.0–36.0)
MCV: 96.6 fL (ref 80.0–100.0)
Platelets: 160 10*3/uL (ref 150–400)
RBC: 2.37 MIL/uL — ABNORMAL LOW (ref 4.22–5.81)
RDW: 15 % (ref 11.5–15.5)
WBC: 31.9 10*3/uL — ABNORMAL HIGH (ref 4.0–10.5)
nRBC: 2.1 % — ABNORMAL HIGH (ref 0.0–0.2)

## 2022-01-05 LAB — GLUCOSE, CAPILLARY
Glucose-Capillary: 147 mg/dL — ABNORMAL HIGH (ref 70–99)
Glucose-Capillary: 180 mg/dL — ABNORMAL HIGH (ref 70–99)
Glucose-Capillary: 208 mg/dL — ABNORMAL HIGH (ref 70–99)
Glucose-Capillary: 215 mg/dL — ABNORMAL HIGH (ref 70–99)
Glucose-Capillary: 83 mg/dL (ref 70–99)
Glucose-Capillary: 97 mg/dL (ref 70–99)

## 2022-01-05 LAB — PHOSPHORUS: Phosphorus: 5.6 mg/dL — ABNORMAL HIGH (ref 2.5–4.6)

## 2022-01-05 MED ORDER — FUROSEMIDE 10 MG/ML IJ SOLN
80.0000 mg | Freq: Two times a day (BID) | INTRAMUSCULAR | Status: AC
Start: 2022-01-05 — End: 2022-01-06
  Administered 2022-01-05 – 2022-01-06 (×2): 80 mg via INTRAVENOUS
  Filled 2022-01-05 (×2): qty 8

## 2022-01-05 MED ORDER — CALCIUM GLUCONATE-NACL 1-0.675 GM/50ML-% IV SOLN
1.0000 g | Freq: Once | INTRAVENOUS | Status: AC
  Administered 2022-01-05: 1000 mg via INTRAVENOUS
  Filled 2022-01-05: qty 50

## 2022-01-05 MED ORDER — MAGNESIUM SULFATE 2 GM/50ML IV SOLN
2.0000 g | Freq: Once | INTRAVENOUS | Status: AC
  Administered 2022-01-05: 2 g via INTRAVENOUS
  Filled 2022-01-05: qty 50

## 2022-01-05 MED ORDER — METOLAZONE 5 MG PO TABS
2.5000 mg | ORAL_TABLET | Freq: Once | ORAL | Status: AC
Start: 2022-01-05 — End: 2022-01-05
  Administered 2022-01-05: 2.5 mg
  Filled 2022-01-05: qty 1

## 2022-01-05 MED ORDER — POTASSIUM CHLORIDE 10 MEQ/100ML IV SOLN
10.0000 meq | INTRAVENOUS | Status: AC
  Administered 2022-01-05 (×2): 10 meq via INTRAVENOUS
  Filled 2022-01-05 (×2): qty 100

## 2022-01-05 MED ORDER — POTASSIUM CHLORIDE 20 MEQ PO PACK
40.0000 meq | PACK | Freq: Once | ORAL | Status: AC
  Administered 2022-01-05: 40 meq
  Filled 2022-01-05: qty 2

## 2022-01-05 NOTE — Progress Notes (Signed)
NAME:  [name], MRN:  [id number], DOB:  [birth year], LOS: 8 ADMISSION DATE:  12/28/2021, CONSULTATION DATE: 12/29/2018. REFERRING MD: ER physician, CHIEF COMPLAINT: Sepsis  History of Present Illness:  76 year old male who presents to the hospital several days general malaise, fever, abdominal pain more focused in the right side.  He is notable for admission for supratherapeutic INR which he takes Coumadin for atrial fibrillation.  CT scan of the abdomen demonstrated enlarged gallbladder consistent with cholecystitis and question thrombus versus unopacified blood within LEFT atrial appendage;   Pertinent  Medical History   Past Medical History:  Diagnosis Date   Allergic rhinitis 08/08/2013   takes Loratadine daily    Aortic stenosis 09/08/2011   With mild aortic regurgitation, mean gradient 13 mmHg    Cataract    right but immature   Complication of anesthesia    stopped breathing - during shoulder surgery 2009-2010   Coronary artery disease 10/02/2009   Cardiac cath (October 2007): 20% left main, 60% mid LAD, 60-70% distal LAD, 30-40% first diagonal, 30% circumflex, 40% OM, 30-40% RCA    Degenerative joint disease of shoulder 08/03/2013   Bilateral, s/p right total shoulder arthroplasty 05/29/2011 and left shoulder arthroplasty 54/00/8676   Diastolic dysfunction 19/10/930   Echo (04/04/2016): Grade I   Diverticulosis 10/07/2013   Seen on colonoscopy in 2007    Erectile dysfunction 05/07/2009   Essential hypertension 08/03/2013   had been on Lisinopril but stopped per MD   First degree AV block 03/14/2016   Gastric ulcer 10/07/2013   Seen on EGD 04/10/2006    Hemorrhoids 10/07/2013   Hyperlipidemia 10/02/2009   Paroxysmal atrial fibrillation ([place]) 07/14/2006   One episode, provoked by alcohol use disorder, briefly anticoagulated with warfarin, then switched to aspirin alone   Sciatica associated with disorder of lumbar spine 08/03/2013   Anterolisthesis with right L 4-5 nerve root  compression.  Treated with epidural injections and Physical Therapy.    Seborrheic keratosis 10/07/2013   Tubular adenoma of colon    Significant Hospital Events: Including procedures, antibiotic start and stop dates in addition to other pertinent events   12/28/2021 admitted for cholecystitis 7/2 intubated for rapid respiratory compromise 7/3 pressor requirement increase, volume overloaded 7/4 largely unchanged compared to day prior, creatinine increasing, trial of Lasix 7/6 Remains on CBI overnight with improvement of pressors needs after initiation of stress dose steroids  7/7 after discontinuation of propofol infusion agitation a has remained intermittent issue.  When patient gets agitated and combative gross hematuria is again observed. 7/8 bladder perf repair by urology d/t CBI   Interim History / Subjective:   Remains critically ill. Intubated on life support. Glucose low on d10   Objective   Blood pressure (!) 78/41, pulse 75, temperature 98.1 F (36.7 C), resp. rate 18, height '5\' 6"'$  (1.676 m), weight 90.9 kg, SpO2 97 %.    Vent Mode: PRVC FiO2 (%):  [50 %-60 %] 55 % Set Rate:  [12 bmp-16 bmp] 12 bmp Vt Set:  [510 mL] 510 mL PEEP:  [5 cmH20] 5 cmH20 Plateau Pressure:  [23 cmH20-24 cmH20] 24 cmH20   Intake/Output Summary (Last 24 hours) at 01/05/2022 0751 Last data filed at 01/05/2022 0739 Gross per 24 hour  Intake 36326.72 ml  Output 38130 ml  Net -1803.28 ml   Filed Weights   12/31/21 0500 01/02/22 0432 01/03/22 0500  Weight: 93.5 kg 89 kg 90.9 kg    Examination: General: elderly male, critically ill, intuabted on life  support  HEENT: ETT in place  Neuro: off sedation very agitated, follows commands  CV: RRR, s1 s2  PULM: bilateral vented breaths  GI: still very distended  GU: scrotal swelling  Extremities: dependent edema   Resolved Hospital Problem list   Supratherapeutic INR: Resolved with holding coumadin Severe sepsis  Assessment & Plan:   Abdominal  Distension, subcutaneous edema   Ruptured Bladder, s/p repair by urology  Hematuria postop, improved   P: Remains on low rate CBI, followed and managed by urology  Diuresis again today   Tissue is very edematous due to absorptions of cbi fluid in pelvis  Acute cholecystitis Sepsis related to above, present on admission  -Percutaneous drain placement 7/2, appreciate IR, surgery assistance -TTE ok -WBC slightly increased after initiation of stress dose steroids  Perforated bladder P: Continue ceftriaxone  Lets complete 7 days from post-op date from bladder repair    Acute Hypoxemic respiratory failure: -Due to volume overload, severe sepsis with shock P: PAD guideline sedation  Pain control  VAP bundle   Acute renal failure - Improving, initially presenting with sepsis  -Mixed physiology from sepsis, ATN with hypotension, volume overload - recent rise in Serum Creatinine, ?pseudo-azotemia  P: Looks like he has UOP based on corrected I&Os Follow UOP and Scr   Chronic atrial fibrillation on Coumadin --d/c heparin 7/5 after hematuria P: Holding AC until hematuria is stable off CBI  Question thrombus versus unopacified blood within LEFT atrial appendage;  -None seen on 2D TTE  Nutrition: Hypoglycemia P: Stop d10 Start tube feeds   Best Practice (right click and "Reselect all SmartList Selections" daily)   Diet/type: NPO  DVT prophylaxis: hold with hematuria GI prophylaxis: PPI Lines: N/A Foley:  N/A Code Status:  DNR Last date of multidisciplinary goals of care discussion [See IPAL 7/5]  This patient is critically ill with multiple organ system failure; which, requires frequent high complexity decision making, assessment, support, evaluation, and titration of therapies. This was completed through the application of advanced monitoring technologies and extensive interpretation of multiple databases. During this encounter critical care time was devoted to patient care  services described in this note for 45 minutes.  Garner Nash, DO [facility] 01/05/2022 7:51 AM

## 2022-01-05 NOTE — Progress Notes (Signed)
[facility] Progress Note Patient Name: [name] DOB: [birth year] MRN: [id number]   Date of Service  01/05/2022  HPI/Events of Note  K 3.6, Mg 1.5, Ca 6.4 Creatinine 2.26  eICU Interventions  Ordered K 20 meqs IV Magnesium 1 g IVPB Calcium IVPB     Intervention Category Intermediate Interventions: Electrolyte abnormality - evaluation and management  Judd Lien 01/05/2022, 5:57 AM

## 2022-01-05 NOTE — Progress Notes (Signed)
Dr Garen Lah notified of urine progressing from light pink to a more red color. Order given to slightly increase irrigation. Increased to 2gtt per second

## 2022-01-05 NOTE — Progress Notes (Addendum)
Urology Progress Note   1 Day Post-Op from attempted cysto + clot evac and ex-lap with cystorrhaphy.  Subjective: NAEON. Remains intuabted/sedated. CBI running at a fast rate this AM, very clear pink. CBI was held temporarily and on re-assessment 5-10 minutes later had darkened to a light red, CBI restarted at a slow drip. Creatinine downtrending, Hgb stable after OR. Drain SS with low output. Difficult to determine exact urine output but likely >3L.   Objective: Vital signs in last 24 hours: Temp:  [93.7 F (34.3 C)-99.3 F (37.4 C)] 99.3 F (37.4 C) (07/09 0805) Pulse Rate:  [59-106] 94 (07/09 0805) Resp:  [8-24] 15 (07/09 0805) BP: (62-170)/(37-99) 117/51 (07/09 0805) SpO2:  [91 %-100 %] 94 % (07/09 0805) Arterial Line BP: (92-194)/(47-86) 194/86 (07/09 0837) FiO2 (%):  [55 %-60 %] 55 % (07/09 0805)  Intake/Output from previous day: 07/08 0701 - 07/09 0700 In: 16109.6 [I.V.:3694.7; IV Piggyback:691.7] Out: 04540 [Urine:36600; Emesis/NG output:100; Drains:345] Intake/Output this shift: Total I/O In: 3000 [Other:3000] Out: 3285 [Urine:1750; Drains:1535]  Physical Exam:  General: Intubated/sedated, agitated when sedation weaned CV: Regular rate Lungs: No increased work of breathing Abdomen: Diffusely distended, soft,  Incision c/d/I with minimal strikethrough. JP SS GU: Foley in place with CBI running at slow rate, light pink  Ext: NT, No erythema  Lab Results: Recent Labs    01/04/22 1500 01/04/22 2017 01/05/22 0436  HGB 6.1* 6.1* 7.6*  HCT 18.0* 18.0* 22.9*   Recent Labs    01/03/22 0843 01/04/22 0602 01/04/22 2017 01/05/22 0436  NA 144   < > 147* 145  K 4.2   < > 3.7 3.6  CL 111  --   --  116*  CO2 17*  --   --  20*  GLUCOSE 178*  --   --  97  BUN 89*  --   --  73*  CREATININE 3.07*  --   --  2.26*  CALCIUM 8.0*  --   --  6.4*   < > = values in this interval not displayed.    Studies/Results: DG Chest Port 1 View  Result Date: 01/04/2022 CLINICAL  DATA:  Hypoxia. EXAM: PORTABLE CHEST 1 VIEW COMPARISON:  01/03/2022 FINDINGS: The ETT tip is stable above the carina. Enteric tube tip and side port project over the expected location of the distal body of stomach and gastric antrum. Percutaneous drainage catheter is noted within the right hemiabdomen. There is a left IJ catheter with tip in the SVC. Aortic atherosclerosis. Stable cardiac enlargement. Very low lung volumes. Bilateral pleural effusions suspected. Atelectasis identified in the lung bases. IMPRESSION: 1. Stable support apparatus. 2. Low lung volumes with bilateral pleural effusions and bibasilar atelectasis. Electronically Signed   By: Kerby Moors M.D.   On: 01/04/2022 13:28   DG Pelvis Portable  Result Date: 01/04/2022 CLINICAL DATA:  Rule out retained foreign body. EXAM: PORTABLE PELVIS 1-2 VIEWS COMPARISON:  CT AP 01/03/2022 FINDINGS: No suspicious retained foreign bodies identified. Skin staples noted identified extending in a cranial caudal fashion along the left side of pelvis. Surgical drainage catheter is identified. No suspicious metallic foreign bodies identified. IMPRESSION: No retained metallic foreign bodies identified. Skin staples and surgical drain are noted. These results were called by telephone at the time of interpretation on 01/04/2022 at 12:38 pm to Cooper in [place] room 9, who verbally acknowledged these results. Electronically Signed   By: Kerby Moors M.D.   On: 01/04/2022 12:38   CT CYSTOGRAM ABD/PELVIS  Result  Date: 01/04/2022 CLINICAL DATA:  Suspected bladder rupture on prior CT examination EXAM: CT CYSTOGRAM (CT ABDOMEN AND PELVIS WITH CONTRAST) TECHNIQUE: Multi-detector CT imaging through the abdomen and pelvis was performed after dilute contrast had been introduced into the bladder for the purposes of performing CT cystography. RADIATION DOSE REDUCTION: This exam was performed according to the departmental dose-optimization program which includes automated exposure  control, adjustment of the mA and/or kV according to patient size and/or use of iterative reconstruction technique. CONTRAST:  74m OMNIPAQUE IOHEXOL 300 MG/ML  SOLN COMPARISON:  01/03/2022 FINDINGS: Lower chest: Small pleural effusions are noted bilaterally. Hepatobiliary: Visualized liver is within normal limits. Cholecystostomy tube is noted in place. Pancreas: Pancreas is within normal limits. A duodenal diverticulum is noted adjacent to the head of the pancreas. Spleen: Visualized portions of the spleen are within normal limits. Adrenals/Urinary Tract: Adrenal glands are unremarkable. Kidneys are well visualized bilaterally. Punctate upper pole stone is again seen on the right. No obstructive changes are seen. The bladder demonstrates multiple filling defects consistent with thrombus similar to that seen on prior examination. Contrast material has been instilled into the bladder and there is significant irregularity along the anterior/inferior aspect of the bladder with evidence of bladder rupture and extension of contrast material into the adjacent extraperitoneal and retroperitoneal spaces. Air is noted as well in the surrounding soft tissues anteriorly related to the recent administration of contrast. Extension into the abdominal musculature anteriorly is noted as well. The fluid in the retroperitoneum all extending along the posterior aspect of Gerota's fascia bilaterally shows some very mild contrast enhancement on delayed images. It is likely the fluid surrounding gross fashion more superiorly represents urine as well. Contrast extends on delayed images into the right inguinal canal related to a fat containing inguinal hernia. Stomach/Bowel: Visualized bowel shows previously administered contrast material. Diverticular change of the colon is noted without diverticulitis. Gastric catheter is noted within the stomach. Vascular/Lymphatic: Diffuse vascular calcifications are noted. No adenopathy is seen.  Reproductive: Prostate is within normal limits. Other: Mild surrounding edema is noted stable from the prior exam. Musculoskeletal: No acute or significant osseous findings. IMPRESSION: Changes consistent with bladder rupture along the anterior inferior aspect of the bladder wall with extraperitoneal extravasation of contrast material which extends superiorly along Gerota's fascia as well as in the anterior abdominal musculature. Extension into the right containing inguinal hernia is noted as well. Electronically Signed   By: MInez CatalinaM.D.   On: 01/04/2022 03:15   CT ABDOMEN PELVIS WO CONTRAST  Result Date: 01/03/2022 CLINICAL DATA:  Acute abdominal pain. History of bladder irrigations. EXAM: CT ABDOMEN AND PELVIS WITHOUT CONTRAST TECHNIQUE: Multidetector CT imaging of the abdomen and pelvis was performed following the standard protocol without IV contrast. RADIATION DOSE REDUCTION: This exam was performed according to the departmental dose-optimization program which includes automated exposure control, adjustment of the mA and/or kV according to patient size and/or use of iterative reconstruction technique. COMPARISON:  CT angiogram chest abdomen and pelvis 12/28/2021 FINDINGS: Lower chest: There is new right lower lobe airspace consolidation. There are new small bilateral pleural effusions. There is new atelectasis in the left lung base. The heart is enlarged, unchanged. Hepatobiliary: There is a new percutaneous cholecystostomy tube. The gallbladder is decompressed. No biliary ductal dilatation. Liver is within normal limits. Pancreas: Unremarkable. No pancreatic ductal dilatation or surrounding inflammatory changes. Spleen: Normal in size without focal abnormality. Adrenals/Urinary Tract: There is a stable punctate calculus in the right kidney. Otherwise, kidneys and  adrenal glands are within normal limits. There is hyperdensity and air within the bladder. Foley catheter is present. Stomach/Bowel: There  is no evidence for bowel obstruction. Enteric tube tip is in the first portion of the duodenum. No dilated bowel loops are seen. There is sigmoid colon diverticulosis. There is no focal wall thickening or inflammation identified. Vascular/Lymphatic: There are severe atherosclerotic calcifications of the aorta and iliac arteries. Aorta and IVC are normal in size. No enlarged lymph nodes. Reproductive: Prostatomegaly is unchanged. Other: There is new low-attenuation fluid throughout the retroperitoneum tracking into the pelvis. There is also new low density fluid in the extraperitoneal pelvic spaces. There is presacral edema. There is also small amount of free air in the pelvis which is new from the prior examination. The majority appears extraperitoneal in location. Some may be intraperitoneal near the level of the umbilicus. There is new small volume ascites in the 4 abdominal quadrants. No abdominal wall hernia. New diffuse body wall edema. Musculoskeletal: Degenerative changes affect the spine. IMPRESSION: 1. There is new low-density fluid in the extraperitoneal pelvis, presacral space and bilateral retroperitoneum. There is also new free air in the extraperitoneal pelvis and questionable new small amount of intraperitoneal free air near the bladder. Findings are concerning for bladder rupture. 2. Foley catheter in the bladder. Hyperdensity throughout the bladder suspicious for bladder hemorrhage. 3. New percutaneous cholecystostomy tube present. 4. Enteric tube tip in the proximal duodenum. 5. New body wall edema. 6. New right lower lobe airspace consolidation with bilateral pleural effusions. These results were called by telephone at the time of interpretation on 01/03/2022 at 10:31 pm to provider Dr. Oletta Darter, who verbally acknowledged these results. Electronically Signed   By: Ronney Asters M.D.   On: 01/03/2022 22:33    Assessment/Plan:  76 y.o. male s/p bladder repair and manual clot evac.  Overall improving  post-op.   - Please continue CBI at a slow rate, please page urology if urine darkens, do not adjust rate without discussing with urology team - Please make sure drain output is accurately recorded - Continue abx per ICU team  Dispo: ICU   LOS: 8 days   I have seen and examined the patient and agree with the above assessment and plan. Will continue to titrate down CBI.  No significant acute bleeding at this time.  Drain output confirmed to be inaccurately reported and is low output.  Spoke with Dr. Valeta Harms and recommended holding anticoagulation until CBI can be turned off and there is no bleeding off CBI.  We will communicate this to primary team.  May benefit from finasteride long term once taking po medication again considering very large prostate and propensity to bleed from prostate on chronic anticoagulation.

## 2022-01-06 ENCOUNTER — Inpatient Hospital Stay (HOSPITAL_COMMUNITY): Payer: Medicare HMO

## 2022-01-06 DIAGNOSIS — K810 Acute cholecystitis: Secondary | ICD-10-CM | POA: Diagnosis not present

## 2022-01-06 DIAGNOSIS — R569 Unspecified convulsions: Secondary | ICD-10-CM | POA: Diagnosis not present

## 2022-01-06 LAB — BASIC METABOLIC PANEL
Anion gap: 12 (ref 5–15)
Anion gap: 10 (ref 5–15)
BUN: 69 mg/dL — ABNORMAL HIGH (ref 8–23)
BUN: 64 mg/dL — ABNORMAL HIGH (ref 8–23)
CO2: 22 mmol/L (ref 22–32)
CO2: 26 mmol/L (ref 22–32)
Calcium: 7.1 mg/dL — ABNORMAL LOW (ref 8.9–10.3)
Calcium: 7.6 mg/dL — ABNORMAL LOW (ref 8.9–10.3)
Chloride: 113 mmol/L — ABNORMAL HIGH (ref 98–111)
Chloride: 114 mmol/L — ABNORMAL HIGH (ref 98–111)
Creatinine, Ser: 1.98 mg/dL — ABNORMAL HIGH (ref 0.61–1.24)
Creatinine, Ser: 1.86 mg/dL — ABNORMAL HIGH (ref 0.61–1.24)
GFR, Estimated: 35 mL/min — ABNORMAL LOW (ref >60)
GFR, Estimated: 38 mL/min — ABNORMAL LOW (ref >60)
Glucose, Bld: 227 mg/dL — ABNORMAL HIGH (ref 70–99)
Glucose, Bld: 164 mg/dL — ABNORMAL HIGH (ref 70–99)
Potassium: 3.7 mmol/L (ref 3.5–5.1)
Potassium: 3.3 mmol/L — ABNORMAL LOW (ref 3.5–5.1)
Sodium: 147 mmol/L — ABNORMAL HIGH (ref 135–145)
Sodium: 150 mmol/L — ABNORMAL HIGH (ref 135–145)

## 2022-01-06 LAB — CBC
HCT: 19.4 % — ABNORMAL LOW (ref 39.0–52.0)
HCT: 26.9 % — ABNORMAL LOW (ref 39.0–52.0)
Hemoglobin: 6.5 g/dL — CL (ref 13.0–17.0)
Hemoglobin: 9.4 g/dL — ABNORMAL LOW (ref 13.0–17.0)
MCH: 32.7 pg (ref 26.0–34.0)
MCH: 32 pg (ref 26.0–34.0)
MCHC: 33.5 g/dL (ref 30.0–36.0)
MCHC: 34.9 g/dL (ref 30.0–36.0)
MCV: 97.5 fL (ref 80.0–100.0)
MCV: 91.5 fL (ref 80.0–100.0)
Platelets: 132 10*3/uL — ABNORMAL LOW (ref 150–400)
Platelets: 154 10*3/uL (ref 150–400)
RBC: 1.99 MIL/uL — ABNORMAL LOW (ref 4.22–5.81)
RBC: 2.94 MIL/uL — ABNORMAL LOW (ref 4.22–5.81)
RDW: 15.2 % (ref 11.5–15.5)
RDW: 15.6 % — ABNORMAL HIGH (ref 11.5–15.5)
WBC: 24.7 10*3/uL — ABNORMAL HIGH (ref 4.0–10.5)
WBC: 24.9 10*3/uL — ABNORMAL HIGH (ref 4.0–10.5)
nRBC: 0.6 % — ABNORMAL HIGH (ref 0.0–0.2)
nRBC: 0.9 % — ABNORMAL HIGH (ref 0.0–0.2)

## 2022-01-06 LAB — GLUCOSE, CAPILLARY
Glucose-Capillary: 218 mg/dL — ABNORMAL HIGH (ref 70–99)
Glucose-Capillary: 132 mg/dL — ABNORMAL HIGH (ref 70–99)
Glucose-Capillary: 176 mg/dL — ABNORMAL HIGH (ref 70–99)
Glucose-Capillary: 176 mg/dL — ABNORMAL HIGH (ref 70–99)
Glucose-Capillary: 187 mg/dL — ABNORMAL HIGH (ref 70–99)
Glucose-Capillary: 174 mg/dL — ABNORMAL HIGH (ref 70–99)

## 2022-01-06 LAB — HEPATIC FUNCTION PANEL
ALT: 14 U/L (ref 0–44)
AST: 21 U/L (ref 15–41)
Albumin: 2.3 g/dL — ABNORMAL LOW (ref 3.5–5.0)
Alkaline Phosphatase: 46 U/L (ref 38–126)
Bilirubin, Direct: 0.6 mg/dL — ABNORMAL HIGH (ref 0.0–0.2)
Indirect Bilirubin: 0.6 mg/dL (ref 0.3–0.9)
Total Bilirubin: 1.2 mg/dL (ref 0.3–1.2)
Total Protein: 5.7 g/dL — ABNORMAL LOW (ref 6.5–8.1)

## 2022-01-06 LAB — PHOSPHORUS: Phosphorus: 5.1 mg/dL — ABNORMAL HIGH (ref 2.5–4.6)

## 2022-01-06 LAB — PREPARE RBC (CROSSMATCH)

## 2022-01-06 LAB — AMMONIA: Ammonia: 24 umol/L (ref 9–35)

## 2022-01-06 LAB — MAGNESIUM: Magnesium: 2.4 mg/dL (ref 1.7–2.4)

## 2022-01-06 MED ORDER — SODIUM CHLORIDE 0.9 % IV SOLN: INTRAVENOUS | Status: DC

## 2022-01-06 MED ORDER — POTASSIUM CHLORIDE 20 MEQ PO PACK
40.0000 meq | PACK | Freq: Once | ORAL | Status: AC
Start: 2022-01-06 — End: 2022-01-06
  Administered 2022-01-06: 40 meq
  Filled 2022-01-06: qty 2

## 2022-01-06 MED ORDER — FREE WATER
300.0000 mL | Status: DC
  Administered 2022-01-06 – 2022-01-09 (×16): 300 mL

## 2022-01-06 MED ORDER — POTASSIUM CHLORIDE 20 MEQ PO PACK
40.0000 meq | PACK | Freq: Once | ORAL | Status: AC
  Administered 2022-01-06: 40 meq
  Filled 2022-01-06: qty 2

## 2022-01-06 MED ORDER — ACETAMINOPHEN 160 MG/5ML PO SOLN
650.0000 mg | Freq: Four times a day (QID) | ORAL | Status: DC | PRN
Start: 2022-01-06 — End: 2022-01-10
  Administered 2022-01-06 – 2022-01-08 (×2): 650 mg
  Filled 2022-01-06 (×2): qty 20.3

## 2022-01-06 MED ORDER — FREE WATER
200.0000 mL | Freq: Three times a day (TID) | Status: DC
Start: 2022-01-06 — End: 2022-01-06
  Administered 2022-01-06 (×2): 200 mL

## 2022-01-06 MED ORDER — METOPROLOL TARTRATE 12.5 MG HALF TABLET
12.5000 mg | ORAL_TABLET | Freq: Four times a day (QID) | ORAL | Status: DC
  Administered 2022-01-06 – 2022-01-09 (×11): 12.5 mg
  Filled 2022-01-06 (×11): qty 1

## 2022-01-06 MED ORDER — QUETIAPINE FUMARATE 50 MG PO TABS
50.0000 mg | ORAL_TABLET | Freq: Two times a day (BID) | ORAL | Status: DC
  Administered 2022-01-06 – 2022-01-08 (×5): 50 mg
  Filled 2022-01-06 (×5): qty 1

## 2022-01-06 MED ORDER — SODIUM CHLORIDE 0.9% IV SOLUTION: Freq: Once | INTRAVENOUS | Status: AC

## 2022-01-06 MED ORDER — SODIUM CHLORIDE 0.9% IV SOLUTION: Freq: Once | INTRAVENOUS | Status: DC

## 2022-01-06 MED ORDER — CALCIUM GLUCONATE-NACL 1-0.675 GM/50ML-% IV SOLN
1.0000 g | Freq: Once | INTRAVENOUS | Status: AC
  Administered 2022-01-06: 1000 mg via INTRAVENOUS
  Filled 2022-01-06: qty 50

## 2022-01-06 NOTE — Progress Notes (Signed)
[facility] ADULT ICU REPLACEMENT PROTOCOL   The patient does apply for the [facility] Adult ICU Electrolyte Replacment Protocol based on the criteria listed below:   1.Exclusion criteria: TCTS patients, ECMO patients, and Dialysis patients 2. Is GFR >/= 30 ml/min? Yes.    Patient's GFR today is 35 3. Is SCr </= 2? Yes.   Patient's SCr is 1.98 mg/dL 4. Did SCr increase >/= 0.5 in 24 hours? No. 5.Pt's weight >40kg  Yes.   6. Abnormal electrolyte(s): K  7. Electrolytes replaced per protocol 8.  Call MD STAT for K+ </= 2.5, Phos </= 1, or Mag </= 1 Physician:  Sherlene Shams [facility] 01/06/2022 3:56 AM

## 2022-01-06 NOTE — Progress Notes (Signed)
EEG complete - results pending 

## 2022-01-06 NOTE — TOC Progression Note (Signed)
Transition of Care [facility]) - Initial/Assessment Note    Patient Details  Name: [name] MRN: [id number] Date of Birth: 06/17/1946  [facility] CM/SW Contact:    Milinda Antis, [place] Phone Number: 01/06/2022, 2:27 PM  Clinical Narrative:                 CSW received consult for LTACH placement and attempted to contact the patient's wife to obtain facility choice as patient is intubated.  There was no answer and CSW was unable to leave a VM.    TOC will continue to follow.          Patient Goals and CMS Choice        Expected Discharge Plan and Services                                                Prior Living Arrangements/Services                       Activities of Daily Living Home Assistive Devices/Equipment: Dentures (specify type) (Dentures upper and lower) ADL Screening (condition at time of admission) Patient's cognitive ability adequate to safely complete daily activities?: Yes Is the patient deaf or have difficulty hearing?: No Does the patient have difficulty seeing, even when wearing glasses/contacts?: No Does the patient have difficulty concentrating, remembering, or making decisions?: No Patient able to express need for assistance with ADLs?: No Does the patient have difficulty dressing or bathing?: No Independently performs ADLs?: Yes (appropriate for developmental age) Does the patient have difficulty walking or climbing stairs?: No Weakness of Legs: Both Weakness of Arms/Hands: Both  Permission Sought/Granted                  Emotional Assessment              Admission diagnosis:  Acute cholecystitis [K81.0] Patient Active Problem List   Diagnosis Date Noted   Acute cholecystitis 12/28/2021   Dyspnea on exertion 07/29/2021   Acquired thrombophilia ([place]) 04/22/2019   Encounter for therapeutic drug monitoring 03/28/2019   Atrial fibrillation ([place]) 03/04/2019   Flat foot, acquired, left  01/24/2019   Gout 01/24/2019   Prediabetes 01/24/2019   Osteoarthritis of both knees 12/12/2014   Tubular adenoma 10/07/2013   Seborrheic keratosis 10/07/2013   Healthcare maintenance 10/07/2013   Allergic rhinitis 08/08/2013   Essential hypertension 08/03/2013   Sciatica associated with disorder of lumbar spine 08/03/2013   Aortic stenosis 09/08/2011   Hyperlipidemia 10/02/2009   CAD (coronary artery disease) 10/02/2009   PCP:  Axel Filler, MD Pharmacy:   [facility] [place], [address] [place] [address] Phone: [phone] Fax: [phone]  [facility] [address] [place]), [place] [address] [place] [address] Phone: [phone] Fax: [phone]     Social Determinants of Health (SDOH) Interventions    Readmission Risk Interventions     No data to display

## 2022-01-06 NOTE — Progress Notes (Signed)
[facility] Progress Note Patient Name: [name] DOB: May 08, 1946 MRN: [id number]   Date of Service  01/06/2022  HPI/Events of Note  Notified of anemia with hemoglobin dropping from 7.6 --> 6.5. CBI is still running with pinkish urine.  eICU Interventions  Transfuse 1 unit pRBC.      Intervention Category Intermediate Interventions: Other:  Elsie Lincoln 01/06/2022, 3:46 AM

## 2022-01-06 NOTE — Procedures (Signed)
Patient Name: MINNIE SHI  MRN: [id number]  Epilepsy Attending: Lora Havens  Referring Physician/Provider: Omar Person, NP  Date: 01/06/2022 Duration: 22.26 mins  Patient history: 76 year old male with altered mental status.  EEG to evaluate for seizure.  Level of alertness:  lethargic   AEDs during EEG study: None  Technical aspects: This EEG study was done with scalp electrodes positioned according to the 10-20 International system of electrode placement. Electrical activity was acquired at a sampling rate of '500Hz'$  and reviewed with a high frequency filter of '70Hz'$  and a low frequency filter of '1Hz'$ . EEG data were recorded continuously and digitally stored.   Description: EEG showed continuous generalized low amplitude 3 to 6 Hz theta-delta slowing. Hyperventilation and photic stimulation were not performed.     Of note, parts of the EEG were difficult to interpret due to electrode artifact.  ABNORMALITY - Continuous slow, generalized  IMPRESSION: This technically difficult study is suggestive of moderate to severe diffuse encephalopathy, nonspecific etiology. No seizures or epileptiform discharges were seen throughout the recording.  Rossetta Kama Barbra Sarks

## 2022-01-06 NOTE — Progress Notes (Signed)
General Surgery  Perc chole in place, draining bilious fluid, indicating cystic duct is patent. Patient went to OR 2 days ago for bladder rupture and clot evacuation, remains on CBI.   Percutaneous cholecystostomy is functioning well. We will arrange outpatient follow up for patient in general surgery clinic after discharge. Will sign off at this time, please call with any further questions or concerns.  Michaelle Birks, MD [facility] Surgery General, Hepatobiliary and Pancreatic Surgery 01/06/22 10:27 AM

## 2022-01-06 NOTE — Progress Notes (Signed)
Referring Physician(s): Reather Laurence   Supervising Physician: Ruthann Cancer  Patient Status:  [facility] - In-pt  Chief Complaint: Acute cholecystitis s/p perc chole placement on 7/2 by Dr. Laurence Ferrari   Patient was found to have a ruptured urinary bladder, s/p repair by urology on 7/8.   Subjective: Pt laying in bed, intubated and sedated.  RN at bedside, reports drain is functioning well and output has been bilious.  RN also reports that patient is tolerating weaning sedation well.  On CBI.   Allergies: Patient has no known allergies.  Medications: Prior to Admission medications   Medication Sig Start Date End Date Taking? Authorizing Provider  allopurinol (ZYLOPRIM) 100 MG tablet TAKE 2 TABLETS EVERY DAY (DOSE INCREASE) Patient taking differently: Take 200 mg by mouth daily. 05/27/21  Yes Axel Filler, MD  atorvastatin (LIPITOR) 40 MG tablet TAKE 1 TABLET EVERY DAY Patient taking differently: Take 40 mg by mouth daily. 08/14/21  Yes Axel Filler, MD  carvedilol (COREG) 6.25 MG tablet TAKE 1 TABLET TWICE DAILY WITH MEALS (NEED MD APPOINTMENT) Patient taking differently: Take 6.25 mg by mouth 2 (two) times daily with a meal. 11/19/21  Yes Crenshaw, Denice Bors, MD  furosemide (LASIX) 20 MG tablet TAKE 1 TABLET EVERY DAY Patient taking differently: Take 20 mg by mouth daily. 11/19/21  Yes Lelon Perla, MD  losartan (COZAAR) 50 MG tablet TAKE 1 TABLET EVERY DAY Patient taking differently: Take 50 mg by mouth daily. 03/20/21  Yes Lelon Perla, MD  naproxen sodium (ALEVE) 220 MG tablet Take 440 mg by mouth daily as needed (pain).   Yes [provider]  warfarin (COUMADIN) 5 MG tablet Take 1-2 tablets by mouth daily or as directed by Coumadin clinic Patient taking differently: Take 2.5-5 mg by mouth daily. 2.5 mg daily on Sunday, Tuesday, Thursday, Saturday 5 mg daily on Monday, Wednesday, Friday 11/19/21  Yes Crenshaw, Denice Bors, MD     Vital  Signs: BP 118/62   Pulse 97   Temp 99.7 F (37.6 C)   Resp 16   Ht '5\' 6"'$  (1.676 m)   Wt 236 lb 12.4 oz (107.4 kg)   SpO2 95%   BMI 38.22 kg/m   Physical Exam Vitals reviewed.  Constitutional:      Comments: Sedated   HENT:     Mouth/Throat:     Comments: Intubated  Skin:    General: Skin is warm and dry.     Coloration: Skin is not jaundiced.     Comments: Positive RUQ drain to gravity bag. Bilious output in gravity bag, drain flushes well. Site not checked, dressing was replaced yesterday and no issue found per RN.      Imaging: DG Chest Port 1 View  Result Date: 01/04/2022 CLINICAL DATA:  Hypoxia. EXAM: PORTABLE CHEST 1 VIEW COMPARISON:  01/03/2022 FINDINGS: The ETT tip is stable above the carina. Enteric tube tip and side port project over the expected location of the distal body of stomach and gastric antrum. Percutaneous drainage catheter is noted within the right hemiabdomen. There is a left IJ catheter with tip in the SVC. Aortic atherosclerosis. Stable cardiac enlargement. Very low lung volumes. Bilateral pleural effusions suspected. Atelectasis identified in the lung bases. IMPRESSION: 1. Stable support apparatus. 2. Low lung volumes with bilateral pleural effusions and bibasilar atelectasis. Electronically Signed   By: Kerby Moors M.D.   On: 01/04/2022 13:28   DG Pelvis Portable  Result Date: 01/04/2022 CLINICAL DATA:  Rule  out retained foreign body. EXAM: PORTABLE PELVIS 1-2 VIEWS COMPARISON:  CT AP 01/03/2022 FINDINGS: No suspicious retained foreign bodies identified. Skin staples noted identified extending in a cranial caudal fashion along the left side of pelvis. Surgical drainage catheter is identified. No suspicious metallic foreign bodies identified. IMPRESSION: No retained metallic foreign bodies identified. Skin staples and surgical drain are noted. These results were called by telephone at the time of interpretation on 01/04/2022 at 12:38 pm to [place] in [facility] room 9,  who verbally acknowledged these results. Electronically Signed   By: Kerby Moors M.D.   On: 01/04/2022 12:38   CT CYSTOGRAM ABD/PELVIS  Result Date: 01/04/2022 CLINICAL DATA:  Suspected bladder rupture on prior CT examination EXAM: CT CYSTOGRAM (CT ABDOMEN AND PELVIS WITH CONTRAST) TECHNIQUE: Multi-detector CT imaging through the abdomen and pelvis was performed after dilute contrast had been introduced into the bladder for the purposes of performing CT cystography. RADIATION DOSE REDUCTION: This exam was performed according to the departmental dose-optimization program which includes automated exposure control, adjustment of the mA and/or kV according to patient size and/or use of iterative reconstruction technique. CONTRAST:  88m OMNIPAQUE IOHEXOL 300 MG/ML  SOLN COMPARISON:  01/03/2022 FINDINGS: Lower chest: Small pleural effusions are noted bilaterally. Hepatobiliary: Visualized liver is within normal limits. Cholecystostomy tube is noted in place. Pancreas: Pancreas is within normal limits. A duodenal diverticulum is noted adjacent to the head of the pancreas. Spleen: Visualized portions of the spleen are within normal limits. Adrenals/Urinary Tract: Adrenal glands are unremarkable. Kidneys are well visualized bilaterally. Punctate upper pole stone is again seen on the right. No obstructive changes are seen. The bladder demonstrates multiple filling defects consistent with thrombus similar to that seen on prior examination. Contrast material has been instilled into the bladder and there is significant irregularity along the anterior/inferior aspect of the bladder with evidence of bladder rupture and extension of contrast material into the adjacent extraperitoneal and retroperitoneal spaces. Air is noted as well in the surrounding soft tissues anteriorly related to the recent administration of contrast. Extension into the abdominal musculature anteriorly is noted as well. The fluid in the retroperitoneum  all extending along the posterior aspect of Gerota's fascia bilaterally shows some very mild contrast enhancement on delayed images. It is likely the fluid surrounding gross fashion more superiorly represents urine as well. Contrast extends on delayed images into the right inguinal canal related to a fat containing inguinal hernia. Stomach/Bowel: Visualized bowel shows previously administered contrast material. Diverticular change of the colon is noted without diverticulitis. Gastric catheter is noted within the stomach. Vascular/Lymphatic: Diffuse vascular calcifications are noted. No adenopathy is seen. Reproductive: Prostate is within normal limits. Other: Mild surrounding edema is noted stable from the prior exam. Musculoskeletal: No acute or significant osseous findings. IMPRESSION: Changes consistent with bladder rupture along the anterior inferior aspect of the bladder wall with extraperitoneal extravasation of contrast material which extends superiorly along Gerota's fascia as well as in the anterior abdominal musculature. Extension into the right containing inguinal hernia is noted as well. Electronically Signed   By: MInez CatalinaM.D.   On: 01/04/2022 03:15   CT ABDOMEN PELVIS WO CONTRAST  Result Date: 01/03/2022 CLINICAL DATA:  Acute abdominal pain. History of bladder irrigations. EXAM: CT ABDOMEN AND PELVIS WITHOUT CONTRAST TECHNIQUE: Multidetector CT imaging of the abdomen and pelvis was performed following the standard protocol without IV contrast. RADIATION DOSE REDUCTION: This exam was performed according to the departmental dose-optimization program which includes automated exposure control,  adjustment of the mA and/or kV according to patient size and/or use of iterative reconstruction technique. COMPARISON:  CT angiogram chest abdomen and pelvis 12/28/2021 FINDINGS: Lower chest: There is new right lower lobe airspace consolidation. There are new small bilateral pleural effusions. There is new  atelectasis in the left lung base. The heart is enlarged, unchanged. Hepatobiliary: There is a new percutaneous cholecystostomy tube. The gallbladder is decompressed. No biliary ductal dilatation. Liver is within normal limits. Pancreas: Unremarkable. No pancreatic ductal dilatation or surrounding inflammatory changes. Spleen: Normal in size without focal abnormality. Adrenals/Urinary Tract: There is a stable punctate calculus in the right kidney. Otherwise, kidneys and adrenal glands are within normal limits. There is hyperdensity and air within the bladder. Foley catheter is present. Stomach/Bowel: There is no evidence for bowel obstruction. Enteric tube tip is in the first portion of the duodenum. No dilated bowel loops are seen. There is sigmoid colon diverticulosis. There is no focal wall thickening or inflammation identified. Vascular/Lymphatic: There are severe atherosclerotic calcifications of the aorta and iliac arteries. Aorta and IVC are normal in size. No enlarged lymph nodes. Reproductive: Prostatomegaly is unchanged. Other: There is new low-attenuation fluid throughout the retroperitoneum tracking into the pelvis. There is also new low density fluid in the extraperitoneal pelvic spaces. There is presacral edema. There is also small amount of free air in the pelvis which is new from the prior examination. The majority appears extraperitoneal in location. Some may be intraperitoneal near the level of the umbilicus. There is new small volume ascites in the 4 abdominal quadrants. No abdominal wall hernia. New diffuse body wall edema. Musculoskeletal: Degenerative changes affect the spine. IMPRESSION: 1. There is new low-density fluid in the extraperitoneal pelvis, presacral space and bilateral retroperitoneum. There is also new free air in the extraperitoneal pelvis and questionable new small amount of intraperitoneal free air near the bladder. Findings are concerning for bladder rupture. 2. Foley catheter  in the bladder. Hyperdensity throughout the bladder suspicious for bladder hemorrhage. 3. New percutaneous cholecystostomy tube present. 4. Enteric tube tip in the proximal duodenum. 5. New body wall edema. 6. New right lower lobe airspace consolidation with bilateral pleural effusions. These results were called by telephone at the time of interpretation on 01/03/2022 at 10:31 pm to provider Dr. Oletta Darter, who verbally acknowledged these results. Electronically Signed   By: Ronney Asters M.D.   On: 01/03/2022 22:33   DG CHEST PORT 1 VIEW  Result Date: 01/03/2022 CLINICAL DATA:  Provided history: Hypoxia. EXAM: PORTABLE CHEST 1 VIEW COMPARISON:  Prior chest radiographs 12/30/2021 FINDINGS: ET tube present with tip terminating at the level of the clavicular heads. Unchanged position of a left IJ approach central venous catheter with tip projecting at the level of the lower SVC. An enteric tube passes below the level of the left hemidiaphragm with tip excluded from the field of view. A cholecystostomy tube projects in the region of the right upper quadrant of the abdomen. The cardiomediastinal silhouette is unchanged. Aortic atherosclerosis. Persistent opacity within the right mid to lower lung field compatible with a pleural effusion and underlying atelectasis and/or consolidation. No appreciable airspace consolidation within the left lung. No evidence of pneumothorax. IMPRESSION: Support apparatus, as described. Persistent opacity within the right mid-to-lower lung field compatible with a pleural effusion and underlying atelectasis and/or consolidation. Aortic Atherosclerosis (ICD10-I70.0). Electronically Signed   By: Kellie Simmering D.O.   On: 01/03/2022 08:35    Labs:  CBC: Recent Labs    01/04/22 0439  01/04/22 0602 01/04/22 1335 01/04/22 1500 01/04/22 2017 01/05/22 0436 01/06/22 0301  WBC 22.1*  --  21.9*  --   --  31.9* 24.7*  HGB 7.7*   < > 7.3* 6.1* 6.1* 7.6* 6.5*  HCT 23.9*   < > 22.1* 18.0* 18.0*  22.9* 19.4*  PLT 106*  --  104*  --   --  160 132*   < > = values in this interval not displayed.     COAGS: Recent Labs    12/28/21 0943 12/28/21 1638 12/29/21 0054 12/31/21 0342  INR 7.3* 2.8* 1.7* 1.9*     BMP: Recent Labs    01/03/22 0723 01/03/22 0843 01/04/22 0602 01/04/22 1500 01/04/22 2017 01/05/22 0436 01/06/22 0301  NA 144 144   < > 147* 147* 145 147*  K 3.1* 4.2   < > 4.2 3.7 3.6 3.7  CL 122* 111  --   --   --  116* 113*  CO2 13* 17*  --   --   --  20* 22  GLUCOSE 111* 178*  --   --   --  97 227*  BUN 66* 89*  --   --   --  73* 69*  CALCIUM 5.7* 8.0*  --   --   --  6.4* 7.1*  CREATININE 2.19* 3.07*  --   --   --  2.26* 1.98*  GFRNONAA 31* 21*  --   --   --  30* 35*   < > = values in this interval not displayed.     LIVER FUNCTION TESTS: Recent Labs    12/28/21 0943 01/02/22 0431 01/06/22 0920  BILITOT 2.3* 1.0 1.2  AST '28 24 21  '$ ALT '13 11 14  '$ ALKPHOS 67 43 46  PROT 7.0 6.2* 5.7*  ALBUMIN 3.0* 2.3* 2.3*     Assessment and Plan: Acute cholecystitis s/p perc chole placement on 7/2 by Dr. Laurence Ferrari.    WBC trending down, 24.7 (31.9 yesterday) Afebrile.  Hgb down to 6.5, received 1 U PRBC this morning  Cx E coli   Drain Location: RUQ Size: Fr size: 10 Fr Date of placement: 7/2  Currently to: Drain collection device: gravity 24 hour output:  Output by Drain (mL) 01/04/22 0701 - 01/04/22 1900 01/04/22 1901 - 01/05/22 0700 01/05/22 0701 - 01/05/22 1900 01/05/22 1901 - 01/06/22 0700 01/06/22 0701 - 01/06/22 1036  Closed System Drain 1 Abdomen Bulb (JP) 19 Fr. 90 60 1585    Biliary Tube Cook slip-coat 10.2 Fr. RUQ 135 60 80      Interval imaging/drain manipulation:  CT AP w/o on 7/7  - decompressed GB with NO ductal dilation - findings concerning for urinary bladder rupture   Current examination: Flushes easily.  Insertion site unremarkable. Suture and stat lock in place. Dressed appropriately.   Plan: Continue TID flushes with 5  cc NS. Record output Q shift. Dressing changes QD or PRN if soiled.  Call IR APP or on call IR MD if difficulty flushing or sudden change in drain output.   Discharge planning: Please contact IR APP or on call IR MD prior to patient d/c to ensure appropriate follow up plans are in place.   IR will continue to follow - please call with questions or concerns.   Electronically Signed: Tera Mater, PA-C 01/06/2022, 10:36 AM   I spent a total of 15 Minutes at the the patient's bedside AND on the patient's hospital floor or unit, greater than 50%  of which was counseling/coordinating care for acute cholecystitis.

## 2022-01-06 NOTE — Progress Notes (Addendum)
Urology Progress Note   2 Days Post-Op from attempted cysto + clot evac and ex-lap with cystorrhaphy.  Subjective: NAEON. Remains intuabted/sedated. CBI running at a very slow rate this AM, urine very slight pink tinge. Creatinine continues to down trend, drain SS. Hgb 6.5, transfusion ordered. Roughly 1.5L urine output although difficult to determine exactly with how CBI charted  Objective: Vital signs in last 24 hours: Temp:  [96.8 F (36 C)-99.7 F (37.6 C)] 98.8 F (37.1 C) (07/10 0630) Pulse Rate:  [71-123] 78 (07/10 0630) Resp:  [0-24] 13 (07/10 0630) BP: (86-186)/(51-127) 101/63 (07/10 0630) SpO2:  [89 %-99 %] 96 % (07/10 0630) Arterial Line BP: (103-194)/(51-86) 153/67 (07/09 0900) FiO2 (%):  [50 %-55 %] 50 % (07/10 0555) Weight:  [107.4 kg] 107.4 kg (07/10 0331)  Intake/Output from previous day: 07/09 0701 - 07/10 0700 In: 17821.3 [I.V.:1605.7; Blood:30; NG/GT:986.3; IV Piggyback:194.3] Out: 62376 [Urine:16225; Drains:1665] Intake/Output this shift: Total I/O In: 7150.4 [I.V.:625.4; Blood:30; Other:6000; NG/GT:495] Out: 10000 [Urine:10000]  Physical Exam:  General: Intubated/sedated, agitated when sedation weaned CV: Regular rate Lungs: No increased work of breathing Abdomen: Diffusely distended, soft,  Incision c/d/I with minimal strikethrough. JP SS GU: Foley in place with CBI running at slow rate, very light pink Ext: NT, No erythema  Lab Results: Recent Labs    01/04/22 2017 01/05/22 0436 01/06/22 0301  HGB 6.1* 7.6* 6.5*  HCT 18.0* 22.9* 19.4*    Recent Labs    01/05/22 0436 01/06/22 0301  NA 145 147*  K 3.6 3.7  CL 116* 113*  CO2 20* 22  GLUCOSE 97 227*  BUN 73* 69*  CREATININE 2.26* 1.98*  CALCIUM 6.4* 7.1*     Studies/Results: DG Chest Port 1 View  Result Date: 01/04/2022 CLINICAL DATA:  Hypoxia. EXAM: PORTABLE CHEST 1 VIEW COMPARISON:  01/03/2022 FINDINGS: The ETT tip is stable above the carina. Enteric tube tip and side port project  over the expected location of the distal body of stomach and gastric antrum. Percutaneous drainage catheter is noted within the right hemiabdomen. There is a left IJ catheter with tip in the SVC. Aortic atherosclerosis. Stable cardiac enlargement. Very low lung volumes. Bilateral pleural effusions suspected. Atelectasis identified in the lung bases. IMPRESSION: 1. Stable support apparatus. 2. Low lung volumes with bilateral pleural effusions and bibasilar atelectasis. Electronically Signed   By: Kerby Moors M.D.   On: 01/04/2022 13:28   DG Pelvis Portable  Result Date: 01/04/2022 CLINICAL DATA:  Rule out retained foreign body. EXAM: PORTABLE PELVIS 1-2 VIEWS COMPARISON:  CT AP 01/03/2022 FINDINGS: No suspicious retained foreign bodies identified. Skin staples noted identified extending in a cranial caudal fashion along the left side of pelvis. Surgical drainage catheter is identified. No suspicious metallic foreign bodies identified. IMPRESSION: No retained metallic foreign bodies identified. Skin staples and surgical drain are noted. These results were called by telephone at the time of interpretation on 01/04/2022 at 12:38 pm to [place] in [place] room 9, who verbally acknowledged these results. Electronically Signed   By: Kerby Moors M.D.   On: 01/04/2022 12:38    Assessment/Plan:  76 y.o. male s/p bladder repair and manual clot evac.  Overall improving post-op.   - Please continue CBI at current rate. Please page urology if urine darkens, do not adjust rate without discussing with urology team - Please make sure drain output is accurately recorded - Continue abx per ICU team  Dispo: ICU   LOS: 9 days   I have seen and  examined the patient and agree with the above assessment and plan.  Urine improving and hope to be able to wean off CBI in next 24 hrs.  Would recommend holding anticoagulation still at this time.

## 2022-01-06 NOTE — Progress Notes (Signed)
NAME:  [name], MRN:  [id number], DOB:  12-28-1945, LOS: 9 ADMISSION DATE:  12/28/2021, CONSULTATION DATE: 12/29/2018. REFERRING MD: ER physician, CHIEF COMPLAINT: Sepsis  History of Present Illness:  76 year old male who presents to the hospital several days general malaise, fever, abdominal pain more focused in the right side.  He is notable for admission for supratherapeutic INR which he takes Coumadin for atrial fibrillation.  CT scan of the abdomen demonstrated enlarged gallbladder consistent with cholecystitis and question thrombus versus unopacified blood within LEFT atrial appendage. Admitted to ICU. Underwent perc chole drain placement via IR.   7/2 with worsening mentation and hypoxia requiring intubation. Throughout ICU stay with worsening renal function. 7/4 with hematuria s/p I&O cath. Urology consulted. Started on CBI. 7/7 CT A/P with new low-density fluid in the extraperitoneal pelvis, presacral space and bilateral retroperitoneum. There is also new free air in the extraperitoneal pelvis and questionable new small amount of intraperitoneal free air near the bladder. Findings are concerning for bladder rupture. 7/8 urology re-engaged. Taken to OR for exploratory laparotomy and bladder repair. Stay complicated by continued encephalopathy and vent dependence.   Pertinent  Medical History   Past Medical History:  Diagnosis Date   Allergic rhinitis 08/08/2013   takes Loratadine daily    Aortic stenosis 09/08/2011   With mild aortic regurgitation, mean gradient 13 mmHg    Cataract    right but immature   Complication of anesthesia    stopped breathing - during shoulder surgery 2009-2010   Coronary artery disease 10/02/2009   Cardiac cath (October 2007): 20% left main, 60% mid LAD, 60-70% distal LAD, 30-40% first diagonal, 30% circumflex, 40% OM, 30-40% RCA    Degenerative joint disease of shoulder 08/03/2013   Bilateral, s/p right total shoulder arthroplasty 05/29/2011 and left  shoulder arthroplasty 32/44/0102   Diastolic dysfunction 72/10/3662   Echo (04/04/2016): Grade I   Diverticulosis 10/07/2013   Seen on colonoscopy in 2007    Erectile dysfunction 05/07/2009   Essential hypertension 08/03/2013   had been on Lisinopril but stopped per MD   First degree AV block 03/14/2016   Gastric ulcer 10/07/2013   Seen on EGD 04/10/2006    Hemorrhoids 10/07/2013   Hyperlipidemia 10/02/2009   Paroxysmal atrial fibrillation ([place]) 07/14/2006   One episode, provoked by alcohol use disorder, briefly anticoagulated with warfarin, then switched to aspirin alone   Sciatica associated with disorder of lumbar spine 08/03/2013   Anterolisthesis with right L 4-5 nerve root compression.  Treated with epidural injections and Physical Therapy.    Seborrheic keratosis 10/07/2013   Tubular adenoma of colon    Significant Hospital Events: Including procedures, antibiotic start and stop dates in addition to other pertinent events   12/28/2021 admitted for cholecystitis 7/2 intubated for rapid respiratory compromise 7/3 pressor requirement increase, volume overloaded 7/4 largely unchanged compared to day prior, creatinine increasing, trial of Lasix 7/6 Remains on CBI overnight with improvement of pressors needs after initiation of stress dose steroids  7/7 after discontinuation of propofol infusion agitation a has remained intermittent issue.  When patient gets agitated and combative gross hematuria is again observed. 7/8 bladder perf repair by urology d/t CBI   Interim History / Subjective:  This AM on PS. Does not follow commands.   Objective   Blood pressure (!) 98/52, pulse 71, temperature (!) 97.2 F (36.2 C), resp. rate 13, height '5\' 6"'$  (1.676 m), weight 107.4 kg, SpO2 92 %.    Vent Mode: PRVC FiO2 (%):  [  50 %-55 %] 50 % Set Rate:  [12 bmp] 12 bmp Vt Set:  [510 mL] 510 mL PEEP:  [5 cmH20] 5 cmH20 Plateau Pressure:  [18 cmH20-31 cmH20] 23 cmH20   Intake/Output Summary (Last 24 hours)  at 01/06/2022 0728 Last data filed at 01/06/2022 9147 Gross per 24 hour  Intake 15167.14 ml  Output 17890 ml  Net -2722.86 ml   Filed Weights   01/02/22 0432 01/03/22 0500 01/06/22 0331  Weight: 89 kg 90.9 kg 107.4 kg    Examination: General: Critically ill appearing adult male on vent  HEENT: ETT/OG in place  Neuro: Sedated. Upward gaze. Does not follow commands. Pupils intact and reactive. Moves extremities spontaneously  CV: Irregular, HR 110  PULM: coarse breath sounds, no use of accessory muscles  GI: distended, midline surgical incision noted, drain in place to RUQ GU: extensive scrotal swelling  Extremities: dependent edema   Resolved Hospital Problem list   Supratherapeutic INR: Resolved with holding coumadin Severe sepsis  Assessment & Plan:   Encephalopathy, multifactorial with uremia, sepsis, metabolic derangements, sedation needs  Plan Obtain spot EEG to r/o other causes  Ammonia and LFT pending   Consider CT Head if no improvement  D/C PRN Versed  Continue Klonopin for now  Titrate Precedex/Fentanyl gtt for RASS goal 0/-1    Abdominal Distension, subcutaneous edema   Tissue is very edematous due to absorptions of cbi fluid in pelvis Ruptured Bladder, s/p repair by urology  Hematuria postop, improved   P: Remains on low rate CBI, followed and managed by urology > Urine yellow this AM  Hold Diuresis today given hypernatremia and elevated BUN Transfused one unit RBC this. Plan for one additional > Repeat CBC at 1600  Acute cholecystitis Sepsis related to above, present on admission  -Percutaneous drain placement 7/2, appreciate IR, surgery assistance -TTE ok -WBC slightly increased after initiation of stress dose steroids  Perforated bladder Leucocytosis  P: Continue ceftriaxone (plans to continue for 7 days post bladder repair)  Trend WBC and Fever Curve If febrile re-culture   Acute Hypoxemic respiratory failure: -Due to volume overload, severe  sepsis with shock Respiratory Insufficiently in setting of encephalopathy  P: Vent Support > Currently on PS. Mentation barrier to extubation.   VAP prevention bundle  Titrate supplemental oxygen for saturation goal >92   Acute renal failure - Improving, initially presenting with sepsis  -Mixed physiology from sepsis, ATN with hypotension, volume overload - recent rise in Serum Creatinine, ?pseudo-azotemia  Hypernatremia  Hypocalcemia  P: Trend BMP > Repeat at 1600  Replace Calcium Start Free Water   Chronic atrial fibrillation on Coumadin -d/c heparin 7/5 after hematuria Question thrombus versus unopacified blood within LEFT atrial appendage;  -None seen on 2D TTE P: Holding AC until hematuria is stable off CBI > re-address once anemia improved Start low dose metoprolol   Nutrition: Hypoglycemia P: Continue TF   Best Practice (right click and "Reselect all SmartList Selections" daily)   Diet/type: tubefeeds  DVT prophylaxis: hold with hematuria GI prophylaxis: PPI Lines: Central line. Can attempt PIV placement for we can d/c  Foley:  Yes, and it is still needed Code Status:  DNR Last date of multidisciplinary goals of care discussion [See IPAL 7/5]. Ongoing will update family today.   This patient is critically ill with multiple organ system failure; which, requires frequent high complexity decision making, assessment, support, evaluation, and titration of therapies. This was completed through the application of advanced monitoring technologies and extensive interpretation  of multiple databases. During this encounter critical care time was devoted to patient care services described in this note for 42 minutes.  Hayden Pedro, AGACNP-BC [facility]  PCCM Pgr: [phone]

## 2022-01-07 ENCOUNTER — Inpatient Hospital Stay (HOSPITAL_COMMUNITY): Payer: Medicare HMO

## 2022-01-07 DIAGNOSIS — K810 Acute cholecystitis: Secondary | ICD-10-CM | POA: Diagnosis not present

## 2022-01-07 LAB — CBC
HCT: 27.2 % — ABNORMAL LOW (ref 39.0–52.0)
Hemoglobin: 9.3 g/dL — ABNORMAL LOW (ref 13.0–17.0)
MCH: 32 pg (ref 26.0–34.0)
MCHC: 34.2 g/dL (ref 30.0–36.0)
MCV: 93.5 fL (ref 80.0–100.0)
Platelets: 170 10*3/uL (ref 150–400)
RBC: 2.91 MIL/uL — ABNORMAL LOW (ref 4.22–5.81)
RDW: 15.9 % — ABNORMAL HIGH (ref 11.5–15.5)
WBC: 23.3 10*3/uL — ABNORMAL HIGH (ref 4.0–10.5)
nRBC: 0.3 % — ABNORMAL HIGH (ref 0.0–0.2)

## 2022-01-07 LAB — TYPE AND SCREEN
ABO/RH(D): B NEG
Antibody Screen: NEGATIVE
Unit division: 0
Unit division: 0

## 2022-01-07 LAB — BPAM RBC
Blood Product Expiration Date: 202307132359
Blood Product Expiration Date: 202308022359
ISSUE DATE / TIME: 202307100522
ISSUE DATE / TIME: 202307101100
Unit Type and Rh: 1700
Unit Type and Rh: 9500

## 2022-01-07 LAB — MAGNESIUM: Magnesium: 2.2 mg/dL (ref 1.7–2.4)

## 2022-01-07 LAB — BASIC METABOLIC PANEL
Anion gap: 6 (ref 5–15)
BUN: 54 mg/dL — ABNORMAL HIGH (ref 8–23)
CO2: 27 mmol/L (ref 22–32)
Calcium: 7.5 mg/dL — ABNORMAL LOW (ref 8.9–10.3)
Chloride: 117 mmol/L — ABNORMAL HIGH (ref 98–111)
Creatinine, Ser: 1.58 mg/dL — ABNORMAL HIGH (ref 0.61–1.24)
GFR, Estimated: 46 mL/min — ABNORMAL LOW (ref >60)
Glucose, Bld: 134 mg/dL — ABNORMAL HIGH (ref 70–99)
Potassium: 3.4 mmol/L — ABNORMAL LOW (ref 3.5–5.1)
Sodium: 150 mmol/L — ABNORMAL HIGH (ref 135–145)

## 2022-01-07 LAB — GLUCOSE, CAPILLARY
Glucose-Capillary: 127 mg/dL — ABNORMAL HIGH (ref 70–99)
Glucose-Capillary: 169 mg/dL — ABNORMAL HIGH (ref 70–99)
Glucose-Capillary: 134 mg/dL — ABNORMAL HIGH (ref 70–99)
Glucose-Capillary: 157 mg/dL — ABNORMAL HIGH (ref 70–99)
Glucose-Capillary: 139 mg/dL — ABNORMAL HIGH (ref 70–99)
Glucose-Capillary: 142 mg/dL — ABNORMAL HIGH (ref 70–99)

## 2022-01-07 LAB — PHOSPHORUS: Phosphorus: 3.9 mg/dL (ref 2.5–4.6)

## 2022-01-07 LAB — CALCIUM, IONIZED: Calcium, Ionized, Serum: 4.6 mg/dL (ref 4.5–5.6)

## 2022-01-07 MED ORDER — POTASSIUM CHLORIDE 20 MEQ PO PACK
40.0000 meq | PACK | Freq: Once | ORAL | Status: AC
  Administered 2022-01-07: 40 meq
  Filled 2022-01-07: qty 2

## 2022-01-07 NOTE — Progress Notes (Signed)
Daughter updated via phone on plan of care. Reviewed concerns for encephalopathy and work up. She indicates he has had a difficult time in the past with sedation and waking up. Required nerve block for knee surgery in the past and had difficulty with sedation related to that procedure.          Noe Gens, MSN, APRN, NP-C, AGACNP-BC [facility] 01/07/2022, 9:19 AM   Please see [email or web address] for pager details.   From 7A-7P if no response, please call [phone] After hours, please call ELink [phone]

## 2022-01-07 NOTE — Progress Notes (Signed)
Daughter, Wyatt Haste, called for update. No answer, message left for return call.     Noe Gens, MSN, APRN, NP-C, AGACNP-BC [facility] 01/07/2022, 9:03 AM   Please see [email or web address] for pager details.   From 7A-7P if no response, please call [phone] After hours, please call ELink [phone]

## 2022-01-07 NOTE — Progress Notes (Signed)
Patient transported to CT & back to room 3M03 on the ventilator with no problems.

## 2022-01-07 NOTE — Progress Notes (Signed)
Patient ID: [name], male   DOB: 17-Jan-1946, 76 y.o.   MRN: [id number]  3 Days Post-Op Subjective: Urine has remained clear since CBI stopped yesterday.  Received 2 units of PRBCs with appropriate increased in Hgb.  Objective: Vital signs in last 24 hours: Temp:  [96.7 F (35.9 C)-101.1 F (38.4 C)] 96.7 F (35.9 C) (07/11 0756) Pulse Rate:  [62-136] 63 (07/11 0342) Resp:  [13-39] 13 (07/11 0331) BP: (87-175)/(52-132) 99/56 (07/11 0342) SpO2:  [87 %-100 %] 98 % (07/11 0747) FiO2 (%):  [50 %] 50 % (07/11 0747) Weight:  [104.6 kg] 104.6 kg (07/11 0401)  Intake/Output from previous day: 07/10 0701 - 07/11 0700 In: 3448.9 [I.V.:1297.9; Blood:660.8; NG/GT:1340; IV Piggyback:150.2] Out: 8413 [KGMWN:0272; Drains:80] Intake/Output this shift: No intake/output data recorded.  Physical Exam:  General: Alert and oriented Abd: Dressing in place GU: Urine clear off CBI  Lab Results: Recent Labs    01/06/22 0301 01/06/22 1730 01/07/22 0350  HGB 6.5* 9.4* 9.3*  HCT 19.4* 26.9* 27.2*   BMET Recent Labs    01/06/22 1730 01/07/22 0350  NA 150* 150*  K 3.3* 3.4*  CL 114* 117*  CO2 26 27  GLUCOSE 164* 134*  BUN 64* 54*  CREATININE 1.86* 1.58*  CALCIUM 7.6* 7.5*     Studies/Results: EEG adult  Result Date: 01/06/2022 Lora Havens, MD     01/06/2022 12:28 PM Patient Name: [name] MRN: [id number] Epilepsy Attending: Lora Havens Referring Physician/Provider: Omar Person, NP Date: 01/06/2022 Duration: 22.26 mins Patient history: 76 year old male with altered mental status.  EEG to evaluate for seizure. Level of alertness:  lethargic AEDs during EEG study: None Technical aspects: This EEG study was done with scalp electrodes positioned according to the 10-20 International system of electrode placement. Electrical activity was acquired at a sampling rate of '500Hz'$  and reviewed with a high frequency filter of '70Hz'$  and a low frequency filter of '1Hz'$ . EEG  data were recorded continuously and digitally stored. Description: EEG showed continuous generalized low amplitude 3 to 6 Hz theta-delta slowing. Hyperventilation and photic stimulation were not performed.   Of note, parts of the EEG were difficult to interpret due to electrode artifact. ABNORMALITY - Continuous slow, generalized IMPRESSION: This technically difficult study is suggestive of moderate to severe diffuse encephalopathy, nonspecific etiology. No seizures or epileptiform discharges were seen throughout the recording. Lora Havens    Assessment/Plan: 1) Extraperitoneal bladder perforation/clot retention s/p cystotomy with clot evacuation and repair 7/8:  Will continue to hold CBI and re-evaluate this afternoon.  If urine stays clear, may be able to start a slow heparin drip (no bolus) later today or tomorrow. Will make more formal recommendations later today after we reassess him.  Will keep CBI ready in case needed. Renal function improving.  Will continue to monitor drain and consider checking drain Cr once we are sure CBI will no longer be needed.  Will eventually need cystogram in about 2 weeks to assess for possible voiding trial if appropriate medically at that time.   LOS: 10 days   [name] 01/07/2022, 8:02 AM

## 2022-01-07 NOTE — Progress Notes (Addendum)
NAME:  [name], MRN:  [id number], DOB:  17-Mar-1946, LOS: 10 ADMISSION DATE:  12/28/2021, CONSULTATION DATE: 12/29/2018. REFERRING MD: Dr. Almyra Free CHIEF COMPLAINT: Sepsis  History of Present Illness:  76 year old M admitted 7/1 with several days general malaise, fever, abdominal pain more focused in the right side.  He is notable for admission for supratherapeutic INR which he takes Coumadin for atrial fibrillation.  CT scan of the abdomen demonstrated enlarged gallbladder consistent with cholecystitis and question thrombus versus unopacified blood within LEFT atrial appendage. Admitted to ICU. Underwent perc chole drain placement via IR.   7/2 with worsening mentation and hypoxia requiring intubation. Throughout ICU stay with worsening renal function. 7/4 with hematuria s/p I&O cath. Urology consulted. Started on CBI. 7/7 CT A/P with new low-density fluid in the extraperitoneal pelvis, presacral space and bilateral retroperitoneum. There is also new free air in the extraperitoneal pelvis and questionable new small amount of intraperitoneal free air near the bladder. Findings are concerning for bladder rupture. 7/8 urology re-engaged. Taken to OR for exploratory laparotomy and bladder repair. Stay complicated by continued encephalopathy and vent dependence.   Pertinent  Medical History  CAD  AS  Grade I DD  First Degree AV Block  HTN  HLD  PAF - on coumadin Seasonal Allergies   Significant Hospital Events: Including procedures, antibiotic start and stop dates in addition to other pertinent events   7/1 Admitted for cholecystitis 7/2 Intubated for rapid respiratory compromise 7/3 Pressor requirement increase, volume overloaded 7/4 Largely unchanged compared to day prior, creatinine increasing, trial of Lasix 7/6 Remains on CBI overnight with improvement of pressors needs after initiation of stress dose steroids  7/7 Propofol stopped, agitation a has remained intermittent issue.  When  patient gets agitated and combative gross hematuria is again observed. 7/8 bladder perf repair by urology d/t CBI  7/10 Tolerating PSV, not following commands.  2 units PRBC.   Interim History / Subjective:  Afebrile currently, tmax in last 24 hours 101.1 / WBC 23.3  CBI on pause, pending urine appearance for restart  BC pending, tracheal aspirate pending.  On PSV10/5, 50% Glucose range 134-174  I/O 6.7L UOP, 19m from JP drain, -3.4L in last 24 hours  Objective   Blood pressure (!) 99/56, pulse 63, temperature (!) 96.7 F (35.9 C), temperature source Bladder, resp. rate 13, height '5\' 6"'$  (1.676 m), weight 104.6 kg, SpO2 94 %.    Vent Mode: PRVC FiO2 (%):  [50 %] 50 % Set Rate:  [12 bmp-45 bmp] 45 bmp Vt Set:  [510 mL] 510 mL PEEP:  [5 cmH20] 5 cmH20 Plateau Pressure:  [15 cmH20-23 cmH20] 17 cmH20   Intake/Output Summary (Last 24 hours) at 01/07/2022 0757 Last data filed at 01/07/2022 00947Gross per 24 hour  Intake 3103.04 ml  Output 6855 ml  Net -3751.96 ml   Filed Weights   01/03/22 0500 01/06/22 0331 01/07/22 0401  Weight: 90.9 kg 107.4 kg 104.6 kg    Examination: General: elderly male lying in bed on vent in NAD, appears uncomfortable  HEENT: MM pink/moist, upward gaze, pupils equal / reactive, ETT, anicteric  Neuro: sedate on precedex / fentanyl, moves all extremities but does not follow commands CV: s1s2 irr irr, AF 100's on monitor, no m/r/g PULM: non-labored at rest, lungs bilaterally with good air entry, clear  GI: soft, bsx4 active, midline dressing c/d/i, lower JP drain in place  Extremities: warm/dry, 1+ generalized edema  Skin: abrasion on left cheek, L IJ TLC  GU: pink urine with small clots in foley   Resolved Hospital Problem list   Supratherapeutic INR: Resolved with holding coumadin Severe sepsis  Assessment & Plan:   Acute Metabolic Encephalopathy Multifactorial in setting of uremia, sepsis, metabolic derangements, & sedation needs.  EEG negative  for epileptiform discharges/seizures but showed diffuse encephalopathy. Ammonia 24. LFT's wnl.  -work up largely negative, non-focal exam.  Suspect metabolic in nature but will get CT head  -may ultimately need MRI  -continue PT klonopin  -wean fentanyl / precedex for RASS Goal of 0 to -1    Abdominal Distension, Subcutaneous Edema  / Anasarca Perforated Bladder s/p Surgical Repair with Post-Op Hematuria  Tissue is very edematous due to absorptions of cbi fluid in pelvis.  -CBI per Urology, for use if urine dark (pink this am) > off currently, rec's per Urology are to continue to hold CBI and reassess in pm. If not bleeding, may be able to resume heparin with no bolus.  -monitor JP drainage  -follow abdominal exam closely  -hold diuresis with hypernatremia and elevated BUN/Sr Cr -post op pain control with fentanyl, wound care per Urology  -will need cystogram in 2 weeks  (from 7/11)  Sepsis secondary to Acute Cholecystitis (POA) Perforated Bladder  Leukocytosis  S/p percutaneous drain placement 7/2, appreciate IR, surgery assistance. TTE ok.  -continue ceftriaxone, D4/7 abx  -follow fever curve / WBC trend  -follow up cultures   Acute Hypoxemic Respiratory Failure Due to volume overload, severe sepsis with shock -PRVC 8cc/kg  -wean PEEP / fiO2 for sats >90% -daily SBT / WUA  -VAP prevention measures  -follow intermittent CXR   Acute Renal Failure  Hypernatremia, Hypocalcemia  In setting of sepsis, ATN with hypotension, volume overload - improving.  -Trend BMP / urinary output -Replace electrolytes as indicated -Avoid nephrotoxic agents, ensure adequate renal perfusion -continue free water  -hold lasix 7/11   Permanent AF on Coumadin Questionable Thrombus vs Unopacified Blood within Left Atrial Appendage  Heparin 7/5 after hematuria.  No thrombus seen on TTE -anticoagulation on hold with hematuria & anemia  -continue low dose lopressor   Anemia  S/p PRBC's 7/10 -follow  CBC, H/H stable  -no evidence of further bleeding   At Risk Malnutrition  -TF per nutrition   Hypoglycemia -TF as above  -follow glucose trends  Best Practice (right click and "Reselect all SmartList Selections" daily)  Diet/type: tubefeeds  DVT prophylaxis: hold with hematuria, SCD GI prophylaxis: PPI Lines: Central line. Can attempt PIV placement for we can d/c  Foley:  Yes, and it is still needed Code Status:  DNR Last date of multidisciplinary goals of care discussion: IPAL on 7/5.  Ongoing.   Critical Care Time: 31 minutes    Noe Gens, MSN, APRN, NP-C, AGACNP-BC [facility] 01/07/2022, 7:57 AM   Please see [email or web address] for pager details.   From 7A-7P if no response, please call [phone] After hours, please call ELink [phone]

## 2022-01-07 NOTE — TOC Initial Note (Signed)
Transition of Care [facility]) - Initial/Assessment Note    Patient Details  Name: [name] MRN: [id number] Date of Birth: Feb 23, 1946  Transition of [facility]) CM/SW Contact:    Milinda Antis, [place] Phone Number: 01/07/2022, 12:07 PM  Clinical Narrative:                 CSW received consult for LTACH placement and met with the patient's wife at bedside and presented choice.  The wife reports that she is open to [facility] placement when the patient is medically ready and will speak with the patient's daughter to decide which facility they would like the patient to go to.    TOC will continue to follow  Expected Discharge Plan: Long Term Acute Care (LTAC) Barriers to Discharge: Continued Medical Work up, Requiring sitter/restraints, Ship broker   Patient Goals and CMS Choice   CMS Medicare.gov Compare Post Acute Care list provided to:: Patient Represenative (must comment) Choice offered to / list presented to : Spouse  Expected Discharge Plan and Services Expected Discharge Plan: [place] (LTAC)       Living arrangements for the past 2 months: Single Family Home                                      Prior Living Arrangements/Services Living arrangements for the past 2 months: Single Family Home Lives with:: Spouse          Need for Family Participation in Patient Care: Yes (Comment) Care giver support system in place?: Yes (comment)   Criminal Activity/Legal Involvement Pertinent to Current Situation/Hospitalization: No - Comment as needed  Activities of Daily Living Home Assistive Devices/Equipment: Dentures (specify type) (Dentures upper and lower) ADL Screening (condition at time of admission) Patient's cognitive ability adequate to safely complete daily activities?: Yes Is the patient deaf or have difficulty hearing?: No Does the patient have difficulty seeing, even when wearing glasses/contacts?: No Does the patient have difficulty  concentrating, remembering, or making decisions?: No Patient able to express need for assistance with ADLs?: No Does the patient have difficulty dressing or bathing?: No Independently performs ADLs?: Yes (appropriate for developmental age) Does the patient have difficulty walking or climbing stairs?: No Weakness of Legs: Both Weakness of Arms/Hands: Both  Permission Sought/Granted                  Emotional Assessment Appearance:: Appears stated age Attitude/Demeanor/Rapport: Unable to Assess Affect (typically observed): Unable to Assess   Alcohol / Substance Use: Not Applicable Psych Involvement: No (comment)  Admission diagnosis:  Acute cholecystitis [K81.0] Patient Active Problem List   Diagnosis Date Noted   Acute cholecystitis 12/28/2021   Dyspnea on exertion 07/29/2021   Acquired thrombophilia ([place]) 04/22/2019   Encounter for therapeutic drug monitoring 03/28/2019   Atrial fibrillation ([place]) 03/04/2019   Flat foot, acquired, left 01/24/2019   Gout 01/24/2019   Prediabetes 01/24/2019   Osteoarthritis of both knees 12/12/2014   Tubular adenoma 10/07/2013   Seborrheic keratosis 10/07/2013   Healthcare maintenance 10/07/2013   Allergic rhinitis 08/08/2013   Essential hypertension 08/03/2013   Sciatica associated with disorder of lumbar spine 08/03/2013   Aortic stenosis 09/08/2011   Hyperlipidemia 10/02/2009   CAD (coronary artery disease) 10/02/2009   PCP:  Axel Filler, MD Pharmacy:   [facility] - [place], [address]  [place] [address] Phone: [phone] Fax: [phone]  [place] ([address] [place] [address] [place] ([place]) [place] [address] Phone: [phone] Fax: [phone]     Social Determinants of Health (SDOH) Interventions    Readmission Risk Interventions     No data to display

## 2022-01-07 NOTE — Progress Notes (Incomplete)
01/07/2022  I have seen and evaluated the patient for shock.   S:  Remains on CBI. Great UOP. EEG neg. ***   O: Blood pressure 101/63, pulse 78, temperature 98.8 F (37.1 C), resp. rate 13, height '5\' 6"'$  (1.676 m), weight 107.4 kg, SpO2 96 %.    ****  Hypernatremia still high despite FWF Renal function continues to improve K repleted H/H stable WBC improved No new imaging   A:  Bladder rupture s/p repair 01/04/22 currently with CBI   Acute multifactorial renal injury improving   Acute cholecystitis with septic shock   Acute hypoxemic respiratory failure secondary to septic encephalopathy and inability to handle secretions plus volume overload.   ABLA related to GU losses: transfuse PRN   Hypoglycemia resolved   Ongoing issues with delirium vs. encephalopathy   Hx of Afib on AC   CT artifact on left atrial appendage   P:  - Lung protective tidal volumes limiting driving pressures to < 15cm H2O as able - Sedation titrated to vent compliance and patient comfort using PAD orderset - VAP prevention bundle - Daily SAT/SBT when meets institutional criteria; encephalopathy remains issue - Ceftriaxone x 7 days post bladder repair - Continue CBI, close I/O monitoring, rates per urology guidance. - Perivesicular drains management per urology - GB drain management per CCS - *** rechallenge with [facility] - Allow autodiuresis, continue FWF - ***, consider brain MRI if continues to not follow commands   Patient critically ill due to resp failure Interventions to address this today vent titration Risk of deterioration without these interventions is high   I personally spent 39 minutes providing critical care not including any separately billable procedures   Erskine Emery MD [facility] Prefer epic messenger for cross cover needs If after hours, please call E-link

## 2022-01-07 NOTE — Progress Notes (Signed)
[facility] ADULT ICU REPLACEMENT PROTOCOL   The patient does apply for the [facility] Adult ICU Electrolyte Replacment Protocol based on the criteria listed below:   1.Exclusion criteria: TCTS patients, ECMO patients, and Dialysis patients 2. Is GFR >/= 30 ml/min? Yes.    Patient's GFR today is 46 3. Is SCr </= 2? Yes.   Patient's SCr is 1.58 mg/dL 4. Did SCr increase >/= 0.5 in 24 hours? No. 5.Pt's weight >40kg  Yes.   6. Abnormal electrolyte(s): K+ 3.4  7. Electrolytes replaced per protocol 8.  Call MD STAT for K+ </= 2.5, Phos </= 1, or Mag </= 1 Physician:  Ignacia Marvel 01/07/2022 4:59 AM

## 2022-01-08 ENCOUNTER — Inpatient Hospital Stay (HOSPITAL_COMMUNITY): Payer: Medicare HMO

## 2022-01-08 DIAGNOSIS — K810 Acute cholecystitis: Secondary | ICD-10-CM | POA: Diagnosis not present

## 2022-01-08 LAB — BASIC METABOLIC PANEL
Anion gap: 5 (ref 5–15)
BUN: 41 mg/dL — ABNORMAL HIGH (ref 8–23)
CO2: 29 mmol/L (ref 22–32)
Calcium: 7.7 mg/dL — ABNORMAL LOW (ref 8.9–10.3)
Chloride: 116 mmol/L — ABNORMAL HIGH (ref 98–111)
Creatinine, Ser: 1.28 mg/dL — ABNORMAL HIGH (ref 0.61–1.24)
GFR, Estimated: 59 mL/min — ABNORMAL LOW (ref >60)
Glucose, Bld: 175 mg/dL — ABNORMAL HIGH (ref 70–99)
Potassium: 3.8 mmol/L (ref 3.5–5.1)
Sodium: 150 mmol/L — ABNORMAL HIGH (ref 135–145)

## 2022-01-08 LAB — GLUCOSE, CAPILLARY
Glucose-Capillary: 148 mg/dL — ABNORMAL HIGH (ref 70–99)
Glucose-Capillary: 151 mg/dL — ABNORMAL HIGH (ref 70–99)
Glucose-Capillary: 159 mg/dL — ABNORMAL HIGH (ref 70–99)
Glucose-Capillary: 162 mg/dL — ABNORMAL HIGH (ref 70–99)
Glucose-Capillary: 162 mg/dL — ABNORMAL HIGH (ref 70–99)
Glucose-Capillary: 188 mg/dL — ABNORMAL HIGH (ref 70–99)

## 2022-01-08 LAB — MAGNESIUM: Magnesium: 1.9 mg/dL (ref 1.7–2.4)

## 2022-01-08 LAB — CBC
HCT: 27.2 % — ABNORMAL LOW (ref 39.0–52.0)
Hemoglobin: 9 g/dL — ABNORMAL LOW (ref 13.0–17.0)
MCH: 31.8 pg (ref 26.0–34.0)
MCHC: 33.1 g/dL (ref 30.0–36.0)
MCV: 96.1 fL (ref 80.0–100.0)
Platelets: 177 10*3/uL (ref 150–400)
RBC: 2.83 MIL/uL — ABNORMAL LOW (ref 4.22–5.81)
RDW: 15.9 % — ABNORMAL HIGH (ref 11.5–15.5)
WBC: 20.7 10*3/uL — ABNORMAL HIGH (ref 4.0–10.5)
nRBC: 0.4 % — ABNORMAL HIGH (ref 0.0–0.2)

## 2022-01-08 LAB — PHOSPHORUS: Phosphorus: 3.4 mg/dL (ref 2.5–4.6)

## 2022-01-08 LAB — HEPARIN LEVEL (UNFRACTIONATED): Heparin Unfractionated: 0.1 IU/mL — ABNORMAL LOW (ref 0.30–0.70)

## 2022-01-08 MED ORDER — METOLAZONE 5 MG PO TABS
5.0000 mg | ORAL_TABLET | Freq: Once | ORAL | Status: AC
  Administered 2022-01-08: 5 mg
  Filled 2022-01-08: qty 1

## 2022-01-08 MED ORDER — MAGNESIUM SULFATE 2 GM/50ML IV SOLN
2.0000 g | Freq: Once | INTRAVENOUS | Status: AC
  Administered 2022-01-08: 2 g via INTRAVENOUS
  Filled 2022-01-08: qty 50

## 2022-01-08 MED ORDER — FUROSEMIDE 10 MG/ML IJ SOLN
40.0000 mg | Freq: Once | INTRAMUSCULAR | Status: AC
  Administered 2022-01-08: 40 mg via INTRAVENOUS
  Filled 2022-01-08: qty 4

## 2022-01-08 MED ORDER — PROPOFOL 1000 MG/100ML IV EMUL
0.0000 ug/kg/min | INTRAVENOUS | Status: DC
  Administered 2022-01-08: 15 ug/kg/min via INTRAVENOUS
  Administered 2022-01-08: 10 ug/kg/min via INTRAVENOUS
  Administered 2022-01-09: 30 ug/kg/min via INTRAVENOUS
  Filled 2022-01-08 (×4): qty 100

## 2022-01-08 MED ORDER — ALBUMIN HUMAN 25 % IV SOLN
25.0000 g | Freq: Four times a day (QID) | INTRAVENOUS | Status: AC
  Administered 2022-01-08 (×2): 25 g via INTRAVENOUS
  Filled 2022-01-08 (×2): qty 100

## 2022-01-08 MED ORDER — POTASSIUM CHLORIDE 20 MEQ PO PACK
40.0000 meq | PACK | Freq: Once | ORAL | Status: AC
  Administered 2022-01-08: 40 meq
  Filled 2022-01-08: qty 2

## 2022-01-08 MED ORDER — HEPARIN (PORCINE) 25000 UT/250ML-% IV SOLN
1900.0000 [IU]/h | INTRAVENOUS | Status: DC
  Administered 2022-01-08: 1300 [IU]/h via INTRAVENOUS
  Administered 2022-01-09: 1700 [IU]/h via INTRAVENOUS
  Administered 2022-01-09: 1300 [IU]/h via INTRAVENOUS
  Administered 2022-01-10: 1850 [IU]/h via INTRAVENOUS
  Administered 2022-01-11 – 2022-01-12 (×3): 1900 [IU]/h via INTRAVENOUS
  Filled 2022-01-08 (×7): qty 250

## 2022-01-08 NOTE — Progress Notes (Signed)
RT NOTE: patient placed on CPAP/PSV of 8/5 at 0830.  Currently tolerating well.  Will continue to monitor.

## 2022-01-08 NOTE — Progress Notes (Signed)
NAME:  ANGLE KAREL, MRN:  [id number], DOB:  1946-01-27, LOS: 11 ADMISSION DATE:  12/28/2021, CONSULTATION DATE: 12/29/2018. REFERRING MD: Dr. Almyra Free CHIEF COMPLAINT: Sepsis  History of Present Illness:  76 year old M admitted 7/1 with several days general malaise, fever, abdominal pain more focused in the right side.  He is notable for admission for supratherapeutic INR which he takes Coumadin for atrial fibrillation.  CT scan of the abdomen demonstrated enlarged gallbladder consistent with cholecystitis and question thrombus versus unopacified blood within LEFT atrial appendage (ruled out). Admitted to ICU. Underwent perc chole drain placement via IR.   7/2 with worsening mentation and hypoxia requiring intubation. Throughout ICU stay with worsening renal function. 7/4 with hematuria s/p I&O cath. Urology consulted. Started on CBI. 7/7 CT A/P with new low-density fluid in the extraperitoneal pelvis, presacral space and bilateral retroperitoneum. There is also new free air in the extraperitoneal pelvis and questionable new small amount of intraperitoneal free air near the bladder. Findings are concerning for bladder rupture. 7/8 urology re-engaged. Taken to OR for exploratory laparotomy and bladder repair. Stay complicated by continued encephalopathy and vent dependence.   Pertinent  Medical History  CAD  AS  Grade I DD  First Degree AV Block  HTN  HLD  PAF - on coumadin Seasonal Allergies   Significant Hospital Events: Including procedures, antibiotic start and stop dates in addition to other pertinent events   7/1 Admitted for cholecystitis 7/2 Intubated for rapid respiratory compromise 7/3 Pressor requirement increase, volume overloaded 7/4 Largely unchanged compared to day prior, creatinine increasing, trial of Lasix 7/6 Remains on CBI overnight with improvement of pressors needs after initiation of stress dose steroids  7/7 Propofol stopped, agitation a has remained intermittent  issue.  When patient gets agitated and combative gross hematuria is again observed. 7/8 bladder perf repair by urology d/t CBI  7/10 Tolerating PSV, not following commands.  2 units PRBC.  7/11 PSV, CBI held, no further bleeding. CT head negative.   Interim History / Subjective:  Fentanyl reduced to 50 mcg  Precedex held for WUA  No further bleeding from foley > urine watermelon pink No acute events overnight  Following simple commands this am!  Objective   Blood pressure (!) 111/58, pulse 75, temperature 98.4 F (36.9 C), resp. rate 13, height '5\' 6"'$  (1.676 m), weight 104.6 kg, SpO2 100 %.    Vent Mode: PRVC FiO2 (%):  [50 %] 50 % Set Rate:  [12 bmp] 12 bmp Vt Set:  [510 mL] 510 mL PEEP:  [5 cmH20] 5 cmH20 Plateau Pressure:  [15 cmH20-17 cmH20] 15 cmH20   Intake/Output Summary (Last 24 hours) at 01/08/2022 0800 Last data filed at 01/08/2022 4098 Gross per 24 hour  Intake 3220.49 ml  Output 3955 ml  Net -734.51 ml   Filed Weights   01/03/22 0500 01/06/22 0331 01/07/22 0401  Weight: 90.9 kg 107.4 kg 104.6 kg    Examination: General: elderly male lying in bed on vent, ill appearing & uncomfortable  HEENT: MM pink/moist, ETT, anicteric, pupils equal/reactive  Neuro: agitated delirium, eyes open on fentanyl 6mg, upward gaze, moving all extremities, follows simple commands after reduction of sedation CV: s1s2 irr irr, AF 120's on monitor, SEM LSB 2nd ICS PULM: non-labored at rest, coarse rhonchi bilaterally  GI: soft, bsx4 active, tol TF, lower abd surgical incision c/d/i, LLQ JP drain, R perc choley drain  Extremities: warm/dry, 2+ generalized edema   Skin: no rashes or lesions GU: foley in place  with small clots, watermelon colored urine  Resolved Hospital Problem list   Supratherapeutic INR: Resolved with holding coumadin Severe sepsis  Assessment & Plan:   Acute Metabolic Encephalopathy Multifactorial in setting of uremia, sepsis, metabolic derangements, & sedation  needs.  EEG negative for epileptiform discharges/seizures but showed diffuse encephalopathy. Ammonia 24. LFT's wnl. CT head negative 7/11.  -continue PT klonopin  -reduce ceiling of fentanyl to 300 mcg -change to propofol low dose to allow for quick on/off for exam  -RASS Goal 0 to -1    Abdominal Distension, Subcutaneous Edema  / Anasarca Perforated Bladder s/p Surgical Repair with Post-Op Hematuria  Tissue is very edematous due to absorptions of cbi fluid in pelvis.  -appreciate Urology rec's > CBI discontinued, ok for restart of heparin w/o bolus, pink urine ok  -will need pain control with fentanyl  -wound care per Urology  -will need cystogram in 2 weeks (from 7/11) -monitor JP drainage, abd exam   Sepsis secondary to Acute Cholecystitis (POA), E-Coli in GB Surgical Culture Perforated Bladder  Leukocytosis  S/p percutaneous drain placement 7/2, appreciate IR, surgery assistance. TTE ok.  -ceftriaxone D5/7  -monitor fever curve / WBC trend  -follow up BC, resp culture -monitor GB drain output   Acute Hypoxemic Respiratory Failure Small Bilateral R>L Pleural Effusions Due to volume overload, severe sepsis with shock -PRVC 8cc/kg  -wean PEEP / fiO2 for sats >90% -daily WUA / SBT  -no indication for thoracentesis > bedside US w/o significant fluid  -follow intermittent CXR -VAP prevention measures  Acute Renal Failure  Hypernatremia, Hypocalcemia  In setting of sepsis, ATN with hypotension, volume overload - improving.  -Trend BMP / urinary output -Replace electrolytes as indicated -Avoid nephrotoxic agents, ensure adequate renal perfusion -lasix 40 mg IV x1, metolazone '5mg'$  PT, with KCL  -albumin 25g IV Q6 x2 doses   Permanent AF on Coumadin Questionable Thrombus vs Unopacified Blood within Left Atrial Appendage  Heparin 7/5 after hematuria.  No thrombus seen on TTE -resume heparin IV w/o bolus  -continue low dose lopressor -monitor for bleeding   Anemia  S/p  PRBC's 7/10 -follow CBC with heparin infusion  -monitor for bleeding    At Risk Malnutrition  -TF per Nutrition   Hypoglycemia -TF as above  -monitor glucose trend   Best Practice (right click and "Reselect all SmartList Selections" daily)  Diet/type: tubefeeds  DVT prophylaxis: heparin IV, SCD GI prophylaxis: PPI Lines: Central line.   Foley:  Yes, and it is still needed Code Status:  DNR Last date of multidisciplinary goals of care discussion: IPAL on 7/5.  Daughter updated via phone 7/12, will update on arrival 7/13 (planning in person visit)  Critical Care Time: 66 minutes    Noe Gens, MSN, APRN, NP-C, AGACNP-BC [facility] 01/08/2022, 8:00 AM   Please see [email or web address] for pager details.   From 7A-7P if no response, please call [phone] After hours, please call ELink [phone]

## 2022-01-08 NOTE — Progress Notes (Signed)
Referring Physician(s): Reather Laurence   Supervising Physician: Jacqulynn Cadet  Patient Status:  [facility] - In-pt  Chief Complaint:  Acute cholecystitis s/p perc chole placement on 7/2 by Dr. Laurence Ferrari   Subjective:  Pt laying in bed, intubated and sedated.  Wife at bedside. She states that pt is doing much better and may be intubated tomorrow.   Allergies: Patient has no known allergies.  Medications: Prior to Admission medications   Medication Sig Start Date End Date Taking? Authorizing Provider  allopurinol (ZYLOPRIM) 100 MG tablet TAKE 2 TABLETS EVERY DAY (DOSE INCREASE) Patient taking differently: Take 200 mg by mouth daily. 05/27/21  Yes Axel Filler, MD  atorvastatin (LIPITOR) 40 MG tablet TAKE 1 TABLET EVERY DAY Patient taking differently: Take 40 mg by mouth daily. 08/14/21  Yes Axel Filler, MD  carvedilol (COREG) 6.25 MG tablet TAKE 1 TABLET TWICE DAILY WITH MEALS (NEED MD APPOINTMENT) Patient taking differently: Take 6.25 mg by mouth 2 (two) times daily with a meal. 11/19/21  Yes Crenshaw, Denice Bors, MD  furosemide (LASIX) 20 MG tablet TAKE 1 TABLET EVERY DAY Patient taking differently: Take 20 mg by mouth daily. 11/19/21  Yes Lelon Perla, MD  losartan (COZAAR) 50 MG tablet TAKE 1 TABLET EVERY DAY Patient taking differently: Take 50 mg by mouth daily. 03/20/21  Yes Lelon Perla, MD  naproxen sodium (ALEVE) 220 MG tablet Take 440 mg by mouth daily as needed (pain).   Yes [provider]  warfarin (COUMADIN) 5 MG tablet Take 1-2 tablets by mouth daily or as directed by Coumadin clinic Patient taking differently: Take 2.5-5 mg by mouth daily. 2.5 mg daily on Sunday, Tuesday, Thursday, Saturday 5 mg daily on Monday, Wednesday, Friday 11/19/21  Yes Crenshaw, Denice Bors, MD     Vital Signs: BP 134/67   Pulse (!) 103   Temp (!) 97.2 F (36.2 C)   Resp 15   Ht '5\' 6"'$  (1.676 m)   Wt 230 lb 9.6 oz (104.6 kg)   SpO2 98%   BMI 37.22  kg/m   Physical Exam Constitutional:      Comments: Sedation  Pulmonary:     Comments: Mechanically ventilated  Abdominal:     Comments: Drain flushes/aspirates easily. Site unremarkable with sutures/statlock in place. ~ 100 cc bilious OP in gravity bag. Dressing C/D/I.       Imaging: DG CHEST PORT 1 VIEW  Result Date: 01/08/2022 CLINICAL DATA:  Ventilator dependent respiratory failure. EXAM: PORTABLE CHEST 1 VIEW COMPARISON:  01/04/2022. FINDINGS: 4:51 a.m. ETT tip is 3 cm from the carina. NGT course is cut off in the gastric antrum and neither the proximal side-hole or tip of the tube are included. Right IJ central line again seen with tip in the distal SVC. There is overlying monitor wiring. The heart enlarged. There is central vascular prominence and central edema on the right-greater-than-left. Moderate right and small left pleural effusions have also increased as well as right perihilar and left lower zonal patchy alveolar opacities compatible with pneumonia edema or combination. The left mid field in both apical lungs remain clear. There are bilateral shoulder arthroplasties. IMPRESSION: 1. Increased perihilar vascular congestion and central edema right-greater-than-left. 2. Increasing right-greater-than-left pleural effusions. 3. Worsening right perihilar and left lower zonal opacities consistent with edema, pneumonia or combination. 4. Support devices as above. Electronically Signed   By: Telford Nab M.D.   On: 01/08/2022 07:46   CT HEAD WO CONTRAST (5MM)  Result Date:  01/07/2022 CLINICAL DATA:  Mental status change of unknown cause EXAM: CT HEAD WITHOUT CONTRAST TECHNIQUE: Contiguous axial images were obtained from the base of the skull through the vertex without intravenous contrast. RADIATION DOSE REDUCTION: This exam was performed according to the departmental dose-optimization program which includes automated exposure control, adjustment of the mA and/or kV according to patient  size and/or use of iterative reconstruction technique. COMPARISON:  MRI 05/14/2009 FINDINGS: Brain: No sign of acute infarction, mass lesion, hemorrhage, hydrocephalus or extra-axial collection. Age related volume loss. Chronic dense calcification of the globus palladi as was seen on the prior MRI. Vascular: There is atherosclerotic calcification of the major vessels at the base of the brain. Skull: Negative Sinuses/Orbits: Widespread complete opacification of the paranasal sinuses with chronic mucoperiosteal changes. Orbits negative. Other: None IMPRESSION: No acute intracranial finding. Age related volume loss. Chronic dense calcification of the globus palladi. Atherosclerotic calcification of the major vessels at the base of the brain. Widespread opacification of the paranasal sinuses with chronic mucoperiosteal thickening. Electronically Signed   By: Nelson Chimes M.D.   On: 01/07/2022 11:12   EEG adult  Result Date: 01/06/2022 Lora Havens, MD     01/06/2022 12:28 PM Patient Name: [name] MRN: [id number] Epilepsy Attending: Lora Havens Referring Physician/Provider: Omar Person, NP Date: 01/06/2022 Duration: 22.26 mins Patient history: 76 year old male with altered mental status.  EEG to evaluate for seizure. Level of alertness:  lethargic AEDs during EEG study: None Technical aspects: This EEG study was done with scalp electrodes positioned according to the 10-20 International system of electrode placement. Electrical activity was acquired at a sampling rate of '500Hz'$  and reviewed with a high frequency filter of '70Hz'$  and a low frequency filter of '1Hz'$ . EEG data were recorded continuously and digitally stored. Description: EEG showed continuous generalized low amplitude 3 to 6 Hz theta-delta slowing. Hyperventilation and photic stimulation were not performed.   Of note, parts of the EEG were difficult to interpret due to electrode artifact. ABNORMALITY - Continuous slow, generalized  IMPRESSION: This technically difficult study is suggestive of moderate to severe diffuse encephalopathy, nonspecific etiology. No seizures or epileptiform discharges were seen throughout the recording. Priyanka O Yadav    Labs:  CBC: Recent Labs    01/06/22 0301 01/06/22 1730 01/07/22 0350 01/08/22 0358  WBC 24.7* 24.9* 23.3* 20.7*  HGB 6.5* 9.4* 9.3* 9.0*  HCT 19.4* 26.9* 27.2* 27.2*  PLT 132* 154 170 177    COAGS: Recent Labs    12/28/21 0943 12/28/21 1638 12/29/21 0054 12/31/21 0342  INR 7.3* 2.8* 1.7* 1.9*    BMP: Recent Labs    01/06/22 0301 01/06/22 1730 01/07/22 0350 01/08/22 0358  NA 147* 150* 150* 150*  K 3.7 3.3* 3.4* 3.8  CL 113* 114* 117* 116*  CO2 '22 26 27 29  '$ GLUCOSE 227* 164* 134* 175*  BUN 69* 64* 54* 41*  CALCIUM 7.1* 7.6* 7.5* 7.7*  CREATININE 1.98* 1.86* 1.58* 1.28*  GFRNONAA 35* 38* 46* 59*    LIVER FUNCTION TESTS: Recent Labs    12/28/21 0943 01/02/22 0431 01/06/22 0920  BILITOT 2.3* 1.0 1.2  AST '28 24 21  '$ ALT '13 11 14  '$ ALKPHOS 67 43 46  PROT 7.0 6.2* 5.7*  ALBUMIN 3.0* 2.3* 2.3*    Assessment and Plan:  Drain flushes/aspirates easily.  Insertion site slightly pink. Sutures/statlock in place. Dressing C/D/I  Plan: Continue TID flushes with 5 cc NS. Record output Q shift. Dressing changes QD or  PRN if soiled.  Call IR APP or on call IR MD if difficulty flushing or sudden change in drain output.    Discharge planning: Please contact IR APP or on call IR MD prior to patient d/c to ensure appropriate follow up plans are in place.    IR will continue to follow - please call with questions or concerns.  Electronically Signed: Tyson Alias, NP 01/08/2022, 3:25 PM   I spent a total of 15 Minutes at the the patient's bedside AND on the patient's hospital floor or unit, greater than 50% of which was counseling/coordinating care for acute cholecystitis.

## 2022-01-08 NOTE — Progress Notes (Signed)
Patient ID: [name], male   DOB: [birth year], 76 y.o.   MRN: [id number]  4 Days Post-Op Subjective: Urine remains clear, hemoglobin stable, creatinine continues to downtrend. JP with minimal output, serosanguinous. Midline dressing removed today, abdominal incision well appearing.  Objective: Vital signs in last 24 hours: Temp:  [96.7 F (35.9 C)-100.4 F (38 C)] 98.4 F (36.9 C) (07/12 0600) Pulse Rate:  [75-136] 75 (07/12 0600) Resp:  [10-22] 13 (07/12 0600) BP: (91-162)/(50-104) 111/58 (07/12 0600) SpO2:  [93 %-100 %] 100 % (07/12 0600) FiO2 (%):  [50 %] 50 % (07/12 0400)  Intake/Output from previous day: 07/11 0701 - 07/12 0700 In: 3656 [I.V.:1386; TK/ZS:0109; IV Piggyback:100] Out: 3235 [Urine:3750; Drains:205] Intake/Output this shift: No intake/output data recorded.  Physical Exam:  General: Intubated, agitated Abd: Midline incision with staples well appearing. No erythema or induration. JP drain SS GU: Urine clear off CBI  Lab Results: Recent Labs    01/06/22 1730 01/07/22 0350 01/08/22 0358  HGB 9.4* 9.3* 9.0*  HCT 26.9* 27.2* 27.2*    BMET Recent Labs    01/07/22 0350 01/08/22 0358  NA 150* 150*  K 3.4* 3.8  CL 117* 116*  CO2 27 29  GLUCOSE 134* 175*  BUN 54* 41*  CREATININE 1.58* 1.28*  CALCIUM 7.5* 7.7*      Studies/Results: CT HEAD WO CONTRAST (5MM)  Result Date: 01/07/2022 CLINICAL DATA:  Mental status change of unknown cause EXAM: CT HEAD WITHOUT CONTRAST TECHNIQUE: Contiguous axial images were obtained from the base of the skull through the vertex without intravenous contrast. RADIATION DOSE REDUCTION: This exam was performed according to the departmental dose-optimization program which includes automated exposure control, adjustment of the mA and/or kV according to patient size and/or use of iterative reconstruction technique. COMPARISON:  MRI 05/14/2009 FINDINGS: Brain: No sign of acute infarction, mass lesion, hemorrhage,  hydrocephalus or extra-axial collection. Age related volume loss. Chronic dense calcification of the globus palladi as was seen on the prior MRI. Vascular: There is atherosclerotic calcification of the major vessels at the base of the brain. Skull: Negative Sinuses/Orbits: Widespread complete opacification of the paranasal sinuses with chronic mucoperiosteal changes. Orbits negative. Other: None IMPRESSION: No acute intracranial finding. Age related volume loss. Chronic dense calcification of the globus palladi. Atherosclerotic calcification of the major vessels at the base of the brain. Widespread opacification of the paranasal sinuses with chronic mucoperiosteal thickening. Electronically Signed   By: Nelson Chimes M.D.   On: 01/07/2022 11:12   EEG adult  Result Date: 01/06/2022 Lora Havens, MD     01/06/2022 12:28 PM Patient Name: SAVAN RUTA MRN: [id number] Epilepsy Attending: Lora Havens Referring Physician/Provider: Omar Person, NP Date: 01/06/2022 Duration: 22.26 mins Patient history: 76 year old male with altered mental status.  EEG to evaluate for seizure. Level of alertness:  lethargic AEDs during EEG study: None Technical aspects: This EEG study was done with scalp electrodes positioned according to the 10-20 International system of electrode placement. Electrical activity was acquired at a sampling rate of '500Hz'$  and reviewed with a high frequency filter of '70Hz'$  and a low frequency filter of '1Hz'$ . EEG data were recorded continuously and digitally stored. Description: EEG showed continuous generalized low amplitude 3 to 6 Hz theta-delta slowing. Hyperventilation and photic stimulation were not performed.   Of note, parts of the EEG were difficult to interpret due to electrode artifact. ABNORMALITY - Continuous slow, generalized IMPRESSION: This technically difficult study is suggestive of moderate to severe diffuse  encephalopathy, nonspecific etiology. No seizures or epileptiform  discharges were seen throughout the recording. Lora Havens    Assessment/Plan: 1) Extraperitoneal bladder perforation/clot retention s/p cystotomy with clot evacuation and repair 7/8:  Urine has remained clear off of CBI for >24hours, ok to resume heparin drip today per primary team (please no bolus) 2) Continue JP drain while monitoring response to restarted anticoagulation 3) OK to disconnect from CBI, please page urology if urine character worsens (light watermelon or pink color is OK). Do NOT restart CBI without discussing with urology.  LOS: 11 days   Florentina Addison 01/08/2022, 7:38 AM

## 2022-01-08 NOTE — Progress Notes (Signed)
ANTICOAGULATION CONSULT NOTE - Initial Consult  Pharmacy Consult for Heparin infusion Indication: atrial fibrillation  No Known Allergies  Patient Measurements: Height: '5\' 6"'$  (167.6 cm) Weight: 104.6 kg (230 lb 9.6 oz) IBW/kg (Calculated) : 63.8 Heparin Dosing Weight: 87.2 kg  Vital Signs: Temp: 98.4 F (36.9 C) (07/12 0600) Temp Source: Bladder (07/12 0400) BP: 111/58 (07/12 0600) Pulse Rate: 75 (07/12 0600)  Labs: Recent Labs    01/06/22 1730 01/07/22 0350 01/08/22 0358  HGB 9.4* 9.3* 9.0*  HCT 26.9* 27.2* 27.2*  PLT 154 170 177  CREATININE 1.86* 1.58* 1.28*    Estimated Creatinine Clearance: 57.4 mL/min (A) (by C-G formula based on SCr of 1.28 mg/dL (H)).   Medical History: Past Medical History:  Diagnosis Date   Allergic rhinitis 08/08/2013   takes Loratadine daily    Aortic stenosis 09/08/2011   With mild aortic regurgitation, mean gradient 13 mmHg    Cataract    right but immature   Complication of anesthesia    stopped breathing - during shoulder surgery 2009-2010   Coronary artery disease 10/02/2009   Cardiac cath (October 2007): 20% left main, 60% mid LAD, 60-70% distal LAD, 30-40% first diagonal, 30% circumflex, 40% OM, 30-40% RCA    Degenerative joint disease of shoulder 08/03/2013   Bilateral, s/p right total shoulder arthroplasty 05/29/2011 and left shoulder arthroplasty 68/05/7516   Diastolic dysfunction 00/06/7492   Echo (04/04/2016): Grade I   Diverticulosis 10/07/2013   Seen on colonoscopy in 2007    Erectile dysfunction 05/07/2009   Essential hypertension 08/03/2013   had been on Lisinopril but stopped per MD   First degree AV block 03/14/2016   Gastric ulcer 10/07/2013   Seen on EGD 04/10/2006    Hemorrhoids 10/07/2013   Hyperlipidemia 10/02/2009   Paroxysmal atrial fibrillation ([place]) 07/14/2006   One episode, provoked by alcohol use disorder, briefly anticoagulated with warfarin, then switched to aspirin alone   Sciatica associated with disorder of  lumbar spine 08/03/2013   Anterolisthesis with right L 4-5 nerve root compression.  Treated with epidural injections and Physical Therapy.    Seborrheic keratosis 10/07/2013   Tubular adenoma of colon     Medications:  Medications Prior to Admission  Medication Sig Dispense Refill Last Dose   allopurinol (ZYLOPRIM) 100 MG tablet TAKE 2 TABLETS EVERY DAY (DOSE INCREASE) (Patient taking differently: Take 200 mg by mouth daily.) 180 tablet 3 12/27/2021   atorvastatin (LIPITOR) 40 MG tablet TAKE 1 TABLET EVERY DAY (Patient taking differently: Take 40 mg by mouth daily.) 90 tablet 3 12/27/2021   carvedilol (COREG) 6.25 MG tablet TAKE 1 TABLET TWICE DAILY WITH MEALS (NEED MD APPOINTMENT) (Patient taking differently: Take 6.25 mg by mouth 2 (two) times daily with a meal.) 180 tablet 3 12/28/2021 at 07:30   furosemide (LASIX) 20 MG tablet TAKE 1 TABLET EVERY DAY (Patient taking differently: Take 20 mg by mouth daily.) 90 tablet 3 12/27/2021   losartan (COZAAR) 50 MG tablet TAKE 1 TABLET EVERY DAY (Patient taking differently: Take 50 mg by mouth daily.) 90 tablet 3 12/28/2021   naproxen sodium (ALEVE) 220 MG tablet Take 440 mg by mouth daily as needed (pain).   12/27/2021   warfarin (COUMADIN) 5 MG tablet Take 1-2 tablets by mouth daily or as directed by Coumadin clinic (Patient taking differently: Take 2.5-5 mg by mouth daily. 2.5 mg daily on Sunday, Tuesday, Thursday, Saturday 5 mg daily on Monday, Wednesday, Friday) 90 tablet 0 12/28/2021 at 07:30    Assessment: 76  yo M with history of Afib on warfarin PTA. Admitted with cholecysititis now s/p percutaneous drain placement. Pt's bladder perforated on 7/7 and is now s/p surgical repair on 7/8 and has been on continuous bladder irrigation (CBI) through 7/12. CBI d/c'd and urology ok with resuming anticoagulation. Pharmacy consulted to dose heparin infusion for Afib.  Hgb 9, stable Plt 177, trending up Urine output is now "watermelon color" or light pink - no  longer has blood clots or bright red blood  PTA warfarin dose: 2.'5mg'$  TRSaSu, '5mg'$  MWF Last dose of warfarin 12/28/21 at 07:30  Goal of Therapy:  Heparin level 0.3-0.7 units/ml Monitor platelets by anticoagulation protocol: Yes   Plan:  No bolus given recent bleeding Start heparin infusion at heparin 1300 units/hr Keep heparin level at lower end of goal for first 24 hours Check heparin level in 8 hours Monitor WBC, heparin levels, urine output and other s/sx of bleeding F/u transition back to warfarin  Kaleen Mask 01/08/2022,8:17 AM

## 2022-01-08 NOTE — TOC Progression Note (Signed)
Transition of Care [facility]) - Initial/Assessment Note    Patient Details  Name: [name] MRN: [id number] Date of Birth: May 29, 1946  Transition of Care [facility]) CM/SW Contact:    Milinda Antis, LCSWA Phone Number: 01/08/2022, 11:59 AM  Clinical Narrative:                 CSW met with the patient's daughter at bedside.  The family would like the patient to go to [place] when medically ready.  Jennifer with admissions at [facility] notified and will follow the patient distantly.  TOC will continue to follow.    Expected Discharge Plan: Long Term Acute Care (LTAC) Barriers to Discharge: Continued Medical Work up, Requiring sitter/restraints, Ship broker   Patient Goals and CMS Choice   CMS Medicare.gov Compare Post Acute Care list provided to:: Patient Represenative (must comment) Choice offered to / list presented to : Spouse  Expected Discharge Plan and Services Expected Discharge Plan: [place] (LTAC)       Living arrangements for the past 2 months: Single Family Home                                      Prior Living Arrangements/Services Living arrangements for the past 2 months: Single Family Home Lives with:: Spouse          Need for Family Participation in Patient Care: Yes (Comment) Care giver support system in place?: Yes (comment)   Criminal Activity/Legal Involvement Pertinent to Current Situation/Hospitalization: No - Comment as needed  Activities of Daily Living Home Assistive Devices/Equipment: Dentures (specify type) (Dentures upper and lower) ADL Screening (condition at time of admission) Patient's cognitive ability adequate to safely complete daily activities?: Yes Is the patient deaf or have difficulty hearing?: No Does the patient have difficulty seeing, even when wearing glasses/contacts?: No Does the patient have difficulty concentrating, remembering, or making decisions?: No Patient able to express need for  assistance with ADLs?: No Does the patient have difficulty dressing or bathing?: No Independently performs ADLs?: Yes (appropriate for developmental age) Does the patient have difficulty walking or climbing stairs?: No Weakness of Legs: Both Weakness of Arms/Hands: Both  Permission Sought/Granted                  Emotional Assessment Appearance:: Appears stated age Attitude/Demeanor/Rapport: Unable to Assess Affect (typically observed): Unable to Assess   Alcohol / Substance Use: Not Applicable Psych Involvement: No (comment)  Admission diagnosis:  Acute cholecystitis [K81.0] Patient Active Problem List   Diagnosis Date Noted   Acute cholecystitis 12/28/2021   Dyspnea on exertion 07/29/2021   Acquired thrombophilia ([place]) 04/22/2019   Encounter for therapeutic drug monitoring 03/28/2019   Atrial fibrillation ([place]) 03/04/2019   Flat foot, acquired, left 01/24/2019   Gout 01/24/2019   Prediabetes 01/24/2019   Osteoarthritis of both knees 12/12/2014   Tubular adenoma 10/07/2013   Seborrheic keratosis 10/07/2013   Healthcare maintenance 10/07/2013   Allergic rhinitis 08/08/2013   Essential hypertension 08/03/2013   Sciatica associated with disorder of lumbar spine 08/03/2013   Aortic stenosis 09/08/2011   Hyperlipidemia 10/02/2009   CAD (coronary artery disease) 10/02/2009   PCP:  Axel Filler, MD Pharmacy:   [facility], [place] [address] Phone: [phone] Fax: [place] (SE), [place] - [address] [place] [address] Phone: [phone] Fax: [phone]     Social Determinants of Health (SDOH) Interventions    Readmission Risk Interventions     No data to display

## 2022-01-08 NOTE — Progress Notes (Signed)
[facility] ADULT ICU REPLACEMENT PROTOCOL   The patient does apply for the [facility] Adult ICU Electrolyte Replacment Protocol based on the criteria listed below:   1.Exclusion criteria: TCTS patients, ECMO patients, and Dialysis patients 2. Is GFR >/= 30 ml/min? Yes.    Patient's GFR today is 59 3. Is SCr </= 2? Yes.   Patient's SCr is 1.28 mg/dL 4. Did SCr increase >/= 0.5 in 24 hours? No. 5.Pt's weight >40kg  Yes.   6. Abnormal electrolyte(s): Mag 1.9  7. Electrolytes replaced per protocol 8.  Call MD STAT for K+ </= 2.5, Phos </= 1, or Mag </= 1 Physician:  Randie Heinz 01/08/2022 5:08 AM

## 2022-01-09 DIAGNOSIS — K810 Acute cholecystitis: Secondary | ICD-10-CM | POA: Diagnosis not present

## 2022-01-09 LAB — BASIC METABOLIC PANEL
Anion gap: 12 (ref 5–15)
BUN: 34 mg/dL — ABNORMAL HIGH (ref 8–23)
CO2: 33 mmol/L — ABNORMAL HIGH (ref 22–32)
Calcium: 8.2 mg/dL — ABNORMAL LOW (ref 8.9–10.3)
Chloride: 104 mmol/L (ref 98–111)
Creatinine, Ser: 1.19 mg/dL (ref 0.61–1.24)
GFR, Estimated: >60 mL/min (ref >60)
Glucose, Bld: 133 mg/dL — ABNORMAL HIGH (ref 70–99)
Potassium: 3.4 mmol/L — ABNORMAL LOW (ref 3.5–5.1)
Sodium: 149 mmol/L — ABNORMAL HIGH (ref 135–145)

## 2022-01-09 LAB — GLUCOSE, CAPILLARY
Glucose-Capillary: 103 mg/dL — ABNORMAL HIGH (ref 70–99)
Glucose-Capillary: 116 mg/dL — ABNORMAL HIGH (ref 70–99)
Glucose-Capillary: 128 mg/dL — ABNORMAL HIGH (ref 70–99)
Glucose-Capillary: 148 mg/dL — ABNORMAL HIGH (ref 70–99)
Glucose-Capillary: 162 mg/dL — ABNORMAL HIGH (ref 70–99)
Glucose-Capillary: 169 mg/dL — ABNORMAL HIGH (ref 70–99)

## 2022-01-09 LAB — HEPARIN LEVEL (UNFRACTIONATED)
Heparin Unfractionated: 0.12 IU/mL — ABNORMAL LOW (ref 0.30–0.70)
Heparin Unfractionated: 0.27 IU/mL — ABNORMAL LOW (ref 0.30–0.70)

## 2022-01-09 LAB — CBC
HCT: 27.2 % — ABNORMAL LOW (ref 39.0–52.0)
Hemoglobin: 8.7 g/dL — ABNORMAL LOW (ref 13.0–17.0)
MCH: 31.3 pg (ref 26.0–34.0)
MCHC: 32 g/dL (ref 30.0–36.0)
MCV: 97.8 fL (ref 80.0–100.0)
Platelets: 186 10*3/uL (ref 150–400)
RBC: 2.78 MIL/uL — ABNORMAL LOW (ref 4.22–5.81)
RDW: 15.8 % — ABNORMAL HIGH (ref 11.5–15.5)
WBC: 15.5 10*3/uL — ABNORMAL HIGH (ref 4.0–10.5)
nRBC: 0.4 % — ABNORMAL HIGH (ref 0.0–0.2)

## 2022-01-09 LAB — MAGNESIUM: Magnesium: 2 mg/dL (ref 1.7–2.4)

## 2022-01-09 LAB — CREATININE, FLUID (PLEURAL, PERITONEAL, JP DRAINAGE): Creat, Fluid: 1.4 mg/dL

## 2022-01-09 LAB — TRIGLYCERIDES: Triglycerides: 192 mg/dL — ABNORMAL HIGH (ref <150)

## 2022-01-09 LAB — PHOSPHORUS: Phosphorus: 3.1 mg/dL (ref 2.5–4.6)

## 2022-01-09 MED ORDER — LIP MEDEX EX OINT
TOPICAL_OINTMENT | CUTANEOUS | Status: DC | PRN
Start: 2022-01-09 — End: 2022-01-20
  Filled 2022-01-09: qty 7

## 2022-01-09 MED ORDER — METOLAZONE 5 MG PO TABS
5.0000 mg | ORAL_TABLET | Freq: Once | ORAL | Status: AC
Start: 2022-01-09 — End: 2022-01-09
  Administered 2022-01-09: 5 mg
  Filled 2022-01-09: qty 1

## 2022-01-09 MED ORDER — POTASSIUM CHLORIDE 20 MEQ PO PACK
40.0000 meq | PACK | Freq: Once | ORAL | Status: AC
Start: 2022-01-09 — End: 2022-01-09
  Administered 2022-01-09: 40 meq
  Filled 2022-01-09: qty 2

## 2022-01-09 MED ORDER — POTASSIUM CHLORIDE 20 MEQ PO PACK
40.0000 meq | PACK | Freq: Once | ORAL | Status: AC
  Administered 2022-01-09: 40 meq
  Filled 2022-01-09: qty 2

## 2022-01-09 MED ORDER — FUROSEMIDE 10 MG/ML IJ SOLN
40.0000 mg | Freq: Once | INTRAMUSCULAR | Status: AC
  Administered 2022-01-09: 40 mg via INTRAVENOUS
  Filled 2022-01-09: qty 4

## 2022-01-09 MED ORDER — METOPROLOL TARTRATE 5 MG/5ML IV SOLN
2.5000 mg | Freq: Four times a day (QID) | INTRAVENOUS | Status: DC | PRN
  Administered 2022-01-09: 2.5 mg via INTRAVENOUS
  Filled 2022-01-09: qty 5

## 2022-01-09 NOTE — Progress Notes (Signed)
ANTICOAGULATION CONSULT NOTE - Follow-Up Consult  Pharmacy Consult for Heparin infusion Indication: atrial fibrillation  No Known Allergies  Patient Measurements: Height: '5\' 6"'$  (167.6 cm) Weight: 96.2 kg (212 lb 1.3 oz) IBW/kg (Calculated) : 63.8 Heparin Dosing Weight: 87.2 kg  Vital Signs: Temp: 99.4 F (37.4 C) (07/13 0319) Temp Source: Oral (07/13 0319) BP: 127/62 (07/13 0630) Pulse Rate: 100 (07/13 0630)  Labs: Recent Labs    01/07/22 0350 01/08/22 0358 01/08/22 1755 01/09/22 0344 01/09/22 0525  HGB 9.3* 9.0*  --  8.7*  --   HCT 27.2* 27.2*  --  27.2*  --   PLT 170 177  --  186  --   HEPARINUNFRC  --   --  0.10*  --  0.12*  CREATININE 1.58* 1.28*  --  1.19  --      Estimated Creatinine Clearance: 59.2 mL/min (by C-G formula based on SCr of 1.19 mg/dL).   Medical History: Past Medical History:  Diagnosis Date   Allergic rhinitis 08/08/2013   takes Loratadine daily    Aortic stenosis 09/08/2011   With mild aortic regurgitation, mean gradient 13 mmHg    Cataract    right but immature   Complication of anesthesia    stopped breathing - during shoulder surgery 2009-2010   Coronary artery disease 10/02/2009   Cardiac cath (October 2007): 20% left main, 60% mid LAD, 60-70% distal LAD, 30-40% first diagonal, 30% circumflex, 40% OM, 30-40% RCA    Degenerative joint disease of shoulder 08/03/2013   Bilateral, s/p right total shoulder arthroplasty 05/29/2011 and left shoulder arthroplasty 41/96/2229   Diastolic dysfunction 79/01/9210   Echo (04/04/2016): Grade I   Diverticulosis 10/07/2013   Seen on colonoscopy in 2007    Erectile dysfunction 05/07/2009   Essential hypertension 08/03/2013   had been on Lisinopril but stopped per MD   First degree AV block 03/14/2016   Gastric ulcer 10/07/2013   Seen on EGD 04/10/2006    Hemorrhoids 10/07/2013   Hyperlipidemia 10/02/2009   Paroxysmal atrial fibrillation ([place]) 07/14/2006   One episode, provoked by alcohol use disorder, briefly  anticoagulated with warfarin, then switched to aspirin alone   Sciatica associated with disorder of lumbar spine 08/03/2013   Anterolisthesis with right L 4-5 nerve root compression.  Treated with epidural injections and Physical Therapy.    Seborrheic keratosis 10/07/2013   Tubular adenoma of colon     Medications:  Medications Prior to Admission  Medication Sig Dispense Refill Last Dose   allopurinol (ZYLOPRIM) 100 MG tablet TAKE 2 TABLETS EVERY DAY (DOSE INCREASE) (Patient taking differently: Take 200 mg by mouth daily.) 180 tablet 3 12/27/2021   atorvastatin (LIPITOR) 40 MG tablet TAKE 1 TABLET EVERY DAY (Patient taking differently: Take 40 mg by mouth daily.) 90 tablet 3 12/27/2021   carvedilol (COREG) 6.25 MG tablet TAKE 1 TABLET TWICE DAILY WITH MEALS (NEED MD APPOINTMENT) (Patient taking differently: Take 6.25 mg by mouth 2 (two) times daily with a meal.) 180 tablet 3 12/28/2021 at 07:30   furosemide (LASIX) 20 MG tablet TAKE 1 TABLET EVERY DAY (Patient taking differently: Take 20 mg by mouth daily.) 90 tablet 3 12/27/2021   losartan (COZAAR) 50 MG tablet TAKE 1 TABLET EVERY DAY (Patient taking differently: Take 50 mg by mouth daily.) 90 tablet 3 12/28/2021   naproxen sodium (ALEVE) 220 MG tablet Take 440 mg by mouth daily as needed (pain).   12/27/2021   warfarin (COUMADIN) 5 MG tablet Take 1-2 tablets by mouth daily or  as directed by Coumadin clinic (Patient taking differently: Take 2.5-5 mg by mouth daily. 2.5 mg daily on Sunday, Tuesday, Thursday, Saturday 5 mg daily on Monday, Wednesday, Friday) 90 tablet 0 12/28/2021 at 07:30    Assessment: 76 yo M with history of Afib on warfarin PTA. Admitted with cholecysititis now s/p percutaneous drain placement. Pt's bladder perforated on 7/7 and is now s/p surgical repair on 7/8 and has been on continuous bladder irrigation (CBI) through 7/12. CBI d/c'd and urology ok with resuming anticoagulation. Pharmacy consulted to dose heparin infusion for  Afib.  Heparin level = 0.27, subtherapeutic Heparin infusion rate: 1550 units/hr Hgb 8.7, stable Plt 186, trending up Urine output is red today, however, per urology "appearance after irrigation is consistent with older blood and no clots noted"  PTA warfarin dose: 2.'5mg'$  TRSaSu, '5mg'$  MWF Last dose of warfarin 12/28/21 at 07:30  Goal of Therapy:  Heparin level 0.3-0.7 units/ml Monitor platelets by anticoagulation protocol: Yes   Plan:  Per discussion with CCM and urology, ok to continue heparin infusion Increase heparin infusion to heparin 1700 units/hr No bolus Keep heparin level at lower end of goal for first 24 hours Check heparin level in 8 hours Monitor WBC, heparin levels, urine output and other s/sx of bleeding F/u transition back to warfarin  Kaleen Mask 01/09/2022,3:01 PM

## 2022-01-09 NOTE — Procedures (Signed)
Extubation Procedure Note  Patient Details:   Name: [name] DOB: [birth year] MRN: [id number]   Airway Documentation:    Vent end date: 01/09/22 Vent end time: 1130   Evaluation  O2 sats: transiently fell during during procedure, pt placed on NRB d/t desat on [place]  Complications: No apparent complications Patient did tolerate procedure well, except pt did desat 82-84% on [place], then pt placed on NRB, sat improved to 95% Bilateral Breath Sounds: Clear, Diminished   No  Lenna Sciara 01/09/2022, 12:47 PM

## 2022-01-09 NOTE — Progress Notes (Addendum)
Pt extubated per order Dr Tamala Julian, no stridor noted, however pt does have rhonchi.  Sx orally.  Per CCM, okay to NTS prn and bipap if needed.

## 2022-01-09 NOTE — Progress Notes (Signed)
ANTICOAGULATION CONSULT NOTE  Pharmacy Consult for Heparin  Indication: atrial fibrillation Brief A/P: Heparin level subtherapeutic Increase Heparin rate  No Known Allergies  Patient Measurements: Height: '5\' 6"'$  (167.6 cm) Weight: 96.2 kg (212 lb 1.3 oz) IBW/kg (Calculated) : 63.8 Heparin Dosing Weight: 87.2 kg  Vital Signs: Temp: 99.4 F (37.4 C) (07/13 0319) Temp Source: Oral (07/13 0319) BP: 116/50 (07/13 0530) Pulse Rate: 94 (07/13 0530)  Labs: Recent Labs    01/07/22 0350 01/08/22 0358 01/08/22 1755 01/09/22 0344 01/09/22 0525  HGB 9.3* 9.0*  --  8.7*  --   HCT 27.2* 27.2*  --  27.2*  --   PLT 170 177  --  186  --   HEPARINUNFRC  --   --  0.10*  --  0.12*  CREATININE 1.58* 1.28*  --  1.19  --      Estimated Creatinine Clearance: 59.2 mL/min (by C-G formula based on SCr of 1.19 mg/dL).  Assessment: 76 yo male with history of Afib s/p percutaneous drain placement and bladder rupture/repair, for heparin  Goal of Therapy:  Heparin level 0.3-0.7 units/ml Monitor platelets by anticoagulation protocol: Yes   Plan:  Increase Heparin 1550 units/hr Check heparin level in 8 hours.   Caryl Pina 01/09/2022,6:01 AM

## 2022-01-09 NOTE — Progress Notes (Signed)
Nutrition Follow-up  DOCUMENTATION CODES:   Not applicable  INTERVENTION:   Awaiting SLP evaluation for diet advancement. If unable to safely advance diet, recommend Cortrak placement Friday.  NUTRITION DIAGNOSIS:   Inadequate oral intake related to inability to eat as evidenced by NPO status. Ongoing   GOAL:   Patient will meet greater than or equal to 90% of their needs Unmet  MONITOR:   Vent status, Labs, Weight trends, TF tolerance, I & O's  REASON FOR ASSESSMENT:   Ventilator, Consult Enteral/tube feeding initiation and management (trickle tube feeds)  ASSESSMENT:   76 year old male who presented to the ED on 7/01 with abdominal pain. PMH of PAF, HLD, CAD, HTN. Pt admitted with sepsis secondary to acute cholecystitis.  Discussed patient in ICU rounds and with RN today. Extubated this morning. TF off. Remains NPO this afternoon. SLP evaluation has been ordered.  Labs reviewed. Na 149, K 3.4 CBG: [phone]  Medications reviewed and include Novolog.  RUQ drain: 155 ml output x 24 hours UOP: 6975 ml x 24 hours I&O: -30.1 L   Diet Order:   Diet Order             Diet NPO time specified  Diet effective now                   EDUCATION NEEDS:   No education needs have been identified at this time  Skin:  Skin Assessment: Reviewed RN Assessment  Last BM:  7/13 type 6  Height:   Ht Readings from Last 1 Encounters:  01/08/22 '5\' 6"'$  (1.676 m)    Weight:   Wt Readings from Last 1 Encounters:  01/09/22 96.2 kg    Ideal Body Weight:  64.5 kg  BMI:  Body mass index is 34.23 kg/m.  Estimated Nutritional Needs:   Kcal:  1900-2100  Protein:  105-125 grams  Fluid:  >/= 1.8 L   Lucas Mallow RD, LDN, CNSC Please refer to Amion for contact information.

## 2022-01-09 NOTE — Progress Notes (Signed)
Noted RR increasing 35-40 bpm, pt w/ agitation and slight increased WOB w/ some double stacking breaths and dyssynchrony on vent.  Noted increased HR 130's and sats trending lower.  Pt was placed back on full vent support, RN at bedside and aware.

## 2022-01-09 NOTE — Evaluation (Signed)
Physical Therapy Evaluation Patient Details Name: [name] MRN: [id number] DOB: [birth year] Today's Date: 01/09/2022  History of Present Illness  76 yo admitted 7/1 with malaise, Abd pain and cholecystitis. 7/2 IR placed perc chole drain with intubation same date and sepsis. 7/8 pt with extraperitoneal bladder rupture to OR for ex lap, cystotomy with bladder repair. PMHx: HLD, CAD, AS, HTN, gout, Afib  Clinical Impression  Pt limited by lethargy with decreased ability to clearly speak or keep eyes fully open. Pt's daughter present in room to provide PLOF and home setup. PTA pt very independent and swimming regularly at the gym. Pt with decreased strength, balance, cognition and mobility who will benefit from acute therapy to maximize mobility, safety and function to decrease burden of care.   95% on 5L [place] HR 105-128 with activity       Recommendations for follow up therapy are one component of a multi-disciplinary discharge planning process, led by the attending physician.  Recommendations may be updated based on patient status, additional functional criteria and insurance authorization.  Follow Up Recommendations Acute inpatient rehab (3hours/day)      Assistance Recommended at Discharge Frequent or constant Supervision/Assistance  Patient can return home with the following  Two people to help with walking and/or transfers;Two people to help with bathing/dressing/bathroom;Direct supervision/assist for medications management;Assistance with feeding;Assist for transportation;Help with stairs or ramp for entrance;Direct supervision/assist for financial management;Assistance with cooking/housework    Equipment Recommendations Other (comment) (TBD with progression)  Recommendations for Other Services       Functional Status Assessment Patient has had a recent decline in their functional status and demonstrates the ability to make significant improvements in function in a reasonable  and predictable amount of time.     Precautions / Restrictions Precautions Precautions: Fall;Other (comment) Precaution Comments: lap chole drain, jp drain, penile wounds      Mobility  Bed Mobility Overal bed mobility: Needs Assistance Bed Mobility: Rolling, Supine to Sit, Sit to Supine Rolling: Mod assist   Supine to sit: Max assist, HOB elevated Sit to supine: Max assist   General bed mobility comments: HOB 45 degrees with use of pad to pivot to edge. return to bed with max assist to lift legs and control trunk with bed flat, total +2 to slide to [facility]. rolling with assist of pad for linen change    Transfers                   General transfer comment: not yet unable    Ambulation/Gait                  Stairs            Wheelchair Mobility    Modified Rankin (Stroke Patients Only)       Balance Overall balance assessment: Needs assistance   Sitting balance-Leahy Scale: Poor Sitting balance - Comments: min assist for sitting EOB grossly 4 min with pt limited by fatigue and confusion                                     Pertinent Vitals/Pain Pain Assessment Pain Assessment: CPOT Facial Expression: Relaxed, neutral Body Movements: Absence of movements Muscle Tension: Relaxed Compliance with ventilator (intubated pts.): N/A Vocalization (extubated pts.): Talking in normal tone or no sound CPOT Total: 0 Pain Intervention(s): Repositioned    Home Living Family/patient expects to be discharged to:: Private  residence Living Arrangements: Spouse/significant other Available Help at Discharge: Family;Available 24 hours/day Type of Home: House Home Access: Stairs to enter   [employer] of Steps: 1   Home Layout: One level Home Equipment: Conservation officer, nature (2 wheels);Wheelchair - manual;Shower seat Additional Comments: was going to the gym and swimming PTA    Prior Function Prior Level of Function :  Independent/Modified Independent                     Hand Dominance        Extremity/Trunk Assessment   Upper Extremity Assessment Upper Extremity Assessment: Generalized weakness;Difficult to assess due to impaired cognition    Lower Extremity Assessment Lower Extremity Assessment: Generalized weakness;Difficult to assess due to impaired cognition    Cervical / Trunk Assessment Cervical / Trunk Assessment: Kyphotic  Communication   Communication: HOH;Expressive difficulties (limited vocal quality)  Cognition Arousal/Alertness: Lethargic, Suspect due to medications Behavior During Therapy: Flat affect Overall Cognitive Status: Impaired/Different from baseline Area of Impairment: Orientation, Attention, Memory, Following commands, Safety/judgement, Problem solving                 Orientation Level: Disoriented to, Time, Place, Situation Current Attention Level: Focused Memory: Decreased short-term memory Following Commands: Follows one step commands inconsistently, Follows one step commands with increased time Safety/Judgement: Decreased awareness of safety, Decreased awareness of deficits   Problem Solving: Slow processing General Comments: pt with decreased attention and following commands. Following commands grossly 20% of the time, pt soft spoken with mumbled speech and difficult to hear responses        General Comments      Exercises General Exercises - Upper Extremity Shoulder Flexion: AAROM, Both, 5 reps, Seated General Exercises - Lower Extremity Short Arc Quad: AAROM, Both, Seated (3 reps)   Assessment/Plan    PT Assessment Patient needs continued PT services  PT Problem List Decreased strength;Decreased mobility;Decreased safety awareness;Decreased activity tolerance;Decreased balance;Decreased knowledge of use of DME;Decreased cognition;Cardiopulmonary status limiting activity       PT Treatment Interventions Gait training;Balance  training;DME instruction;Therapeutic exercise;Functional mobility training;Therapeutic activities;Patient/family education;Cognitive remediation;Neuromuscular re-education    PT Goals (Current goals can be found in the Care Plan section)  Acute Rehab PT Goals Patient Stated Goal: be able to return home PT Goal Formulation: With patient/family Time For Goal Achievement: 01/23/22 Potential to Achieve Goals: Fair    Frequency Min 3X/week     Co-evaluation               AM-PAC PT "6 Clicks" Mobility  Outcome Measure Help needed turning from your back to your side while in a flat bed without using bedrails?: A Lot Help needed moving from lying on your back to sitting on the side of a flat bed without using bedrails?: Total Help needed moving to and from a bed to a chair (including a wheelchair)?: Total Help needed standing up from a chair using your arms (e.g., wheelchair or bedside chair)?: Total Help needed to walk in hospital room?: Total Help needed climbing 3-5 steps with a railing? : Total 6 Click Score: 7    End of Session   Activity Tolerance: Patient limited by lethargy Patient left: in bed;with call bell/phone within reach;with family/visitor present;with bed alarm set Nurse Communication: Mobility status PT Visit Diagnosis: Difficulty in walking, not elsewhere classified (R26.2);Muscle weakness (generalized) (M62.81);Other abnormalities of gait and mobility (R26.89)    Time: 0102-7253 PT Time Calculation (min) (ACUTE ONLY): 25 min   Charges:  PT Evaluation $PT Eval Moderate Complexity: 1 Mod PT Treatments $Therapeutic Activity: 8-22 mins        Bayard Males, PT Acute Rehabilitation Services Office: [phone]   Sandy Salaam Cuma Polyakov 01/09/2022, 2:08 PM

## 2022-01-09 NOTE — Progress Notes (Signed)
   Inpatient Rehab Admissions Coordinator :  Per therapy recommendations patient was screened for CIR candidacy by Gabryelle Whitmoyer RN MSN. Patient is not yet at a level to tolerate the intensity required to pursue a CIR admit . Patient may have the potential to progress to become a candidate. The CIR admissions team will follow and monitor for progress and place a Rehab Consult order if felt to be appropriate. Please contact me with any questions.  Derwood Becraft RN MSN Admissions Coordinator [phone]  

## 2022-01-09 NOTE — Progress Notes (Signed)
[facility] ADULT ICU REPLACEMENT PROTOCOL   The patient does apply for the [facility] Adult ICU Electrolyte Replacment Protocol based on the criteria listed below:   1.Exclusion criteria: TCTS patients, ECMO patients, and Dialysis patients 2. Is GFR >/= 30 ml/min? Yes.    Patient's GFR today is >60 3. Is SCr </= 2? Yes.   Patient's SCr is 1.19 mg/dL 4. Did SCr increase >/= 0.5 in 24 hours? No. 5.Pt's weight >40kg  Yes.   6. Abnormal electrolyte(s):   K 3.4  7. Electrolytes replaced per protocol 8.  Call MD STAT for K+ </= 2.5, Phos </= 1, or Mag </= 1 Physician:  S. Rosie Fate R Davonda Ausley 01/09/2022 4:55 AM

## 2022-01-09 NOTE — Progress Notes (Signed)
NAME:  [name], MRN:  [id number], DOB:  1946-01-30, LOS: 12 ADMISSION DATE:  12/28/2021, CONSULTATION DATE: 12/29/2018. REFERRING MD: Dr. Almyra Free CHIEF COMPLAINT: Sepsis  History of Present Illness:  76 year old M admitted 7/1 with several days general malaise, fever, abdominal pain more focused in the right side.  He is notable for admission for supratherapeutic INR which he takes Coumadin for atrial fibrillation.  CT scan of the abdomen demonstrated enlarged gallbladder consistent with cholecystitis and question thrombus versus unopacified blood within LEFT atrial appendage (ruled out). Admitted to ICU. Underwent perc chole drain placement via IR.   7/2 with worsening mentation and hypoxia requiring intubation. Throughout ICU stay with worsening renal function. 7/4 with hematuria s/p I&O cath. Urology consulted. Started on CBI. 7/7 CT A/P with new low-density fluid in the extraperitoneal pelvis, presacral space and bilateral retroperitoneum. There is also new free air in the extraperitoneal pelvis and questionable new small amount of intraperitoneal free air near the bladder. Findings are concerning for bladder rupture. 7/8 urology re-engaged. Taken to OR for exploratory laparotomy and bladder repair. Stay complicated by continued encephalopathy and vent dependence.   Pertinent  Medical History  CAD  AS  Grade I DD  First Degree AV Block  HTN  HLD  PAF - on coumadin Seasonal Allergies   Significant Hospital Events: Including procedures, antibiotic start and stop dates in addition to other pertinent events   7/1 Admitted for cholecystitis 7/2 Intubated for rapid respiratory compromise 7/3 Pressor requirement increase, volume overloaded 7/4 Largely unchanged compared to day prior, creatinine increasing, trial of Lasix 7/6 Remains on CBI overnight with improvement of pressors needs after initiation of stress dose steroids  7/7 Propofol stopped, agitation a has remained intermittent  issue.  When patient gets agitated and combative gross hematuria is again observed. 7/8 bladder perf repair by urology d/t CBI  7/10 Tolerating PSV, not following commands.  2 units PRBC.  7/11 PSV, CBI held, no further bleeding. CT head negative.  7/12 heparin challenge  Interim History / Subjective:  No events.  Diuresed well. Urine is a bit more red.  Objective   Blood pressure 127/62, pulse 100, temperature 99.4 F (37.4 C), temperature source Oral, resp. rate 20, height '5\' 6"'$  (1.676 m), weight 96.2 kg, SpO2 100 %.    Vent Mode: PRVC FiO2 (%):  [40 %-50 %] 50 % Set Rate:  [12 bmp] 12 bmp Vt Set:  [510 mL] 510 mL PEEP:  [5 cmH20] 5 cmH20 Pressure Support:  [8 cmH20] 8 cmH20 Plateau Pressure:  [14 cmH20-16 cmH20] 15 cmH20   Intake/Output Summary (Last 24 hours) at 01/09/2022 0717 Last data filed at 01/09/2022 0600 Gross per 24 hour  Intake 4941.89 ml  Output 7130 ml  Net -2188.11 ml    Filed Weights   01/07/22 0401 01/08/22 0830 01/09/22 0500  Weight: 104.6 kg 104.6 kg 96.2 kg    Examination: No distress RASS -2 Anasarca improving Lungs diminished bases, triggers vent Foley with dark urine RUQ drain small bilious output Perivesicular drain small serosanguinous output Defer neuro to when we do SAT  K low being repleted Cr improving WBC improved H/H essentially stable  Resolved Hospital Problem list   Supratherapeutic INR: Resolved with holding coumadin Severe sepsis  Assessment & Plan:   Acute Metabolic Encephalopathy- ICU/septic delirium, improved - SAT today, encourage day/night cycles - Low dose klonipin   Perforated Bladder s/p Surgical Repair with Post-Op Hematuria  - Urine a bit more red today, will await  urology direction, trend H/H, hold CBI for now - Drain management per urology  Sepsis secondary to Acute Cholecystitis (POA), E-Coli in GB Surgical Culture Perforated Bladder  Leukocytosis  S/p percutaneous drain placement 7/2, appreciate IR,  surgery assistance. TTE ok.  -ceftriaxone D6/7  -monitor fever curve / WBC trend  -follow up BC, resp culture -monitor GB drain output  - CCS following  Acute Hypoxemic Respiratory Failure Acute Renal Failure  Hypernatremia, Hypocalcemia  Due to volume overload, severe sepsis with shock - Continue to push diuresis, metolazone+lasix reordered, aim for 1-2 L net neg per day - Continue FWF - LTVV - SAT/SBT today, hopefully can avoid trach  Permanent AF on Coumadin L atrial appendage abnormality on CT- likely artifact, no echo correlate - Continue heparin -continue low dose lopressor -monitor for bleeding   At Risk Malnutrition  -TF per Nutrition   Hypoglycemia -TF as above  -monitor glucose trend   Best Practice (right click and "Reselect all SmartList Selections" daily)  Diet/type: tubefeeds  DVT prophylaxis: heparin IV, SCD GI prophylaxis: PPI Lines: Central line.   Foley:  Yes, and it is still needed Code Status:  DNR Last date of multidisciplinary goals of care discussion: IPAL on 7/5.  Updated in person 7/12.  Daughter coming in today.  Critical Care Time: 34 minutes    Erskine Emery MD PCCM  Please see [email or web address] for pager details.   From 7A-7P if no response, please call [phone] After hours, please call ELink [phone]

## 2022-01-09 NOTE — Progress Notes (Signed)
Patient ID: [name], male   DOB: 1945/09/27, 76 y.o.   MRN: [id number]  5 Days Post-Op Subjective: Still intubated  Objective: Vital signs in last 24 hours: Temp:  [96.1 F (35.6 C)-101.3 F (38.5 C)] 99.4 F (37.4 C) (07/13 0319) Pulse Rate:  [86-127] 100 (07/13 0630) Resp:  [11-29] 20 (07/13 0630) BP: (98-162)/(50-78) 127/62 (07/13 0630) SpO2:  [94 %-100 %] 100 % (07/13 0630) FiO2 (%):  [40 %-50 %] 50 % (07/13 0400) Weight:  [96.2 kg-104.6 kg] 96.2 kg (07/13 0500)  Intake/Output from previous day: 07/12 0701 - 07/13 0700 In: 4941.9 [I.V.:696.9; WU/XL:2440; IV Piggyback:265] Out: 1027 [OZDGU:4403; Drains:155] Intake/Output this shift: No intake/output data recorded.  Physical Exam:  General: Alert and oriented Abd: Incision C/D/I GU: Urine darker red tinged but draining well  Lab Results: Recent Labs    01/07/22 0350 01/08/22 0358 01/09/22 0344  HGB 9.3* 9.0* 8.7*  HCT 27.2* 27.2* 27.2*   BMET Recent Labs    01/08/22 0358 01/09/22 0344  NA 150* 149*  K 3.8 3.4*  CL 116* 104  CO2 29 33*  GLUCOSE 175* 133*  BUN 41* 34*  CREATININE 1.28* 1.19  CALCIUM 7.7* 8.2*   Procedure: I hand irrigated catheter and no clots noted in bladder.  Studies/Results: DG CHEST PORT 1 VIEW  Result Date: 01/08/2022 CLINICAL DATA:  Ventilator dependent respiratory failure. EXAM: PORTABLE CHEST 1 VIEW COMPARISON:  01/04/2022. FINDINGS: 4:51 a.m. ETT tip is 3 cm from the carina. NGT course is cut off in the gastric antrum and neither the proximal side-hole or tip of the tube are included. Right IJ central line again seen with tip in the distal SVC. There is overlying monitor wiring. The heart enlarged. There is central vascular prominence and central edema on the right-greater-than-left. Moderate right and small left pleural effusions have also increased as well as right perihilar and left lower zonal patchy alveolar opacities compatible with pneumonia edema or combination. The  left mid field in both apical lungs remain clear. There are bilateral shoulder arthroplasties. IMPRESSION: 1. Increased perihilar vascular congestion and central edema right-greater-than-left. 2. Increasing right-greater-than-left pleural effusions. 3. Worsening right perihilar and left lower zonal opacities consistent with edema, pneumonia or combination. 4. Support devices as above. Electronically Signed   By: Telford Nab M.D.   On: 01/08/2022 07:46   CT HEAD WO CONTRAST (5MM)  Result Date: 01/07/2022 CLINICAL DATA:  Mental status change of unknown cause EXAM: CT HEAD WITHOUT CONTRAST TECHNIQUE: Contiguous axial images were obtained from the base of the skull through the vertex without intravenous contrast. RADIATION DOSE REDUCTION: This exam was performed according to the departmental dose-optimization program which includes automated exposure control, adjustment of the mA and/or kV according to patient size and/or use of iterative reconstruction technique. COMPARISON:  MRI 05/14/2009 FINDINGS: Brain: No sign of acute infarction, mass lesion, hemorrhage, hydrocephalus or extra-axial collection. Age related volume loss. Chronic dense calcification of the globus palladi as was seen on the prior MRI. Vascular: There is atherosclerotic calcification of the major vessels at the base of the brain. Skull: Negative Sinuses/Orbits: Widespread complete opacification of the paranasal sinuses with chronic mucoperiosteal changes. Orbits negative. Other: None IMPRESSION: No acute intracranial finding. Age related volume loss. Chronic dense calcification of the globus palladi. Atherosclerotic calcification of the major vessels at the base of the brain. Widespread opacification of the paranasal sinuses with chronic mucoperiosteal thickening. Electronically Signed   By: Nelson Chimes M.D.   On: 01/07/2022 11:12  Assessment/Plan: 1) Extraperitoneal bladder perforation/clot retention s/p cystotomy with clot evacuation  and repair 7/8:  Continue to hold CBI but will watch urine closely.  Currently, appearance and after irrigation is consistent with older blood and no clots noted.  Continue catheter drainage with plans for cystogram about 2 weeks from repair.  Will send drain for Cr level.  Renal function normalizing.   LOS: 12 days   [name] 01/09/2022, 7:27 AM

## 2022-01-10 DIAGNOSIS — K810 Acute cholecystitis: Secondary | ICD-10-CM | POA: Diagnosis not present

## 2022-01-10 LAB — CBC
HCT: 32.9 % — ABNORMAL LOW (ref 39.0–52.0)
Hemoglobin: 10.4 g/dL — ABNORMAL LOW (ref 13.0–17.0)
MCH: 30.8 pg (ref 26.0–34.0)
MCHC: 31.6 g/dL (ref 30.0–36.0)
MCV: 97.3 fL (ref 80.0–100.0)
Platelets: 268 10*3/uL (ref 150–400)
RBC: 3.38 MIL/uL — ABNORMAL LOW (ref 4.22–5.81)
RDW: 15.3 % (ref 11.5–15.5)
WBC: 21.6 10*3/uL — ABNORMAL HIGH (ref 4.0–10.5)
nRBC: 0.1 % (ref 0.0–0.2)

## 2022-01-10 LAB — MAGNESIUM: Magnesium: 1.9 mg/dL (ref 1.7–2.4)

## 2022-01-10 LAB — GLUCOSE, CAPILLARY
Glucose-Capillary: 104 mg/dL — ABNORMAL HIGH (ref 70–99)
Glucose-Capillary: 95 mg/dL (ref 70–99)
Glucose-Capillary: 121 mg/dL — ABNORMAL HIGH (ref 70–99)
Glucose-Capillary: 116 mg/dL — ABNORMAL HIGH (ref 70–99)
Glucose-Capillary: 119 mg/dL — ABNORMAL HIGH (ref 70–99)

## 2022-01-10 LAB — CULTURE, RESPIRATORY W GRAM STAIN

## 2022-01-10 LAB — BASIC METABOLIC PANEL
Anion gap: 8 (ref 5–15)
BUN: 30 mg/dL — ABNORMAL HIGH (ref 8–23)
CO2: 33 mmol/L — ABNORMAL HIGH (ref 22–32)
Calcium: 8.2 mg/dL — ABNORMAL LOW (ref 8.9–10.3)
Chloride: 105 mmol/L (ref 98–111)
Creatinine, Ser: 1.26 mg/dL — ABNORMAL HIGH (ref 0.61–1.24)
GFR, Estimated: 60 mL/min — ABNORMAL LOW (ref >60)
Glucose, Bld: 103 mg/dL — ABNORMAL HIGH (ref 70–99)
Potassium: 3.6 mmol/L (ref 3.5–5.1)
Sodium: 146 mmol/L — ABNORMAL HIGH (ref 135–145)

## 2022-01-10 LAB — HEPARIN LEVEL (UNFRACTIONATED)
Heparin Unfractionated: 0.25 IU/mL — ABNORMAL LOW (ref 0.30–0.70)
Heparin Unfractionated: 0.3 IU/mL (ref 0.30–0.70)
Heparin Unfractionated: 0.33 IU/mL (ref 0.30–0.70)

## 2022-01-10 MED ORDER — MAGNESIUM SULFATE 2 GM/50ML IV SOLN
2.0000 g | Freq: Once | INTRAVENOUS | Status: AC
Start: 2022-01-10 — End: 2022-01-10
  Administered 2022-01-10: 2 g via INTRAVENOUS
  Filled 2022-01-10: qty 50

## 2022-01-10 MED ORDER — CARVEDILOL 3.125 MG PO TABS
3.1250 mg | ORAL_TABLET | Freq: Two times a day (BID) | ORAL | Status: DC
  Administered 2022-01-10 – 2022-01-12 (×4): 3.125 mg via ORAL
  Filled 2022-01-10 (×4): qty 1

## 2022-01-10 MED ORDER — ADULT MULTIVITAMIN W/MINERALS CH
1.0000 | ORAL_TABLET | Freq: Every day | ORAL | Status: DC
  Administered 2022-01-10 – 2022-01-20 (×10): 1 via ORAL
  Filled 2022-01-10 (×11): qty 1

## 2022-01-10 MED ORDER — METOPROLOL TARTRATE 12.5 MG HALF TABLET
12.5000 mg | ORAL_TABLET | Freq: Four times a day (QID) | ORAL | Status: DC
  Administered 2022-01-10: 12.5 mg via ORAL
  Filled 2022-01-10: qty 1

## 2022-01-10 MED ORDER — ACETAMINOPHEN 325 MG PO TABS
650.0000 mg | ORAL_TABLET | Freq: Four times a day (QID) | ORAL | Status: DC | PRN
  Administered 2022-01-17 – 2022-01-19 (×2): 650 mg via ORAL
  Filled 2022-01-10 (×2): qty 2

## 2022-01-10 MED ORDER — ENSURE ENLIVE PO LIQD
237.0000 mL | Freq: Three times a day (TID) | ORAL | Status: DC
  Administered 2022-01-10 – 2022-01-20 (×13): 237 mL via ORAL

## 2022-01-10 MED ORDER — PANTOPRAZOLE 2 MG/ML SUSPENSION: 40.0000 mg | Freq: Every day | ORAL | Status: DC

## 2022-01-10 MED ORDER — GERHARDT'S BUTT CREAM
TOPICAL_CREAM | Freq: Two times a day (BID) | CUTANEOUS | Status: DC
  Filled 2022-01-10 (×2): qty 1

## 2022-01-10 MED ORDER — POLYETHYLENE GLYCOL 3350 17 G PO PACK: 17.0000 g | PACK | Freq: Every day | ORAL | Status: DC | PRN

## 2022-01-10 MED ORDER — MELATONIN 3 MG PO TABS: 3.0000 mg | ORAL_TABLET | Freq: Every evening | ORAL | Status: DC | PRN

## 2022-01-10 MED ORDER — DOCUSATE SODIUM 100 MG PO CAPS
100.0000 mg | ORAL_CAPSULE | Freq: Two times a day (BID) | ORAL | Status: DC | PRN
  Administered 2022-01-19: 100 mg via ORAL
  Filled 2022-01-10: qty 1

## 2022-01-10 MED ORDER — POTASSIUM CHLORIDE 10 MEQ/50ML IV SOLN
10.0000 meq | INTRAVENOUS | Status: AC
  Administered 2022-01-10 (×4): 10 meq via INTRAVENOUS
  Filled 2022-01-10 (×3): qty 50

## 2022-01-10 MED ORDER — SENNOSIDES-DOCUSATE SODIUM 8.6-50 MG PO TABS
1.0000 | ORAL_TABLET | Freq: Every day | ORAL | Status: DC
  Administered 2022-01-12 – 2022-01-19 (×8): 1 via ORAL
  Filled 2022-01-10 (×8): qty 1

## 2022-01-10 NOTE — Progress Notes (Signed)
Patient ID: [name], male   DOB: February 28, 1946, 76 y.o.   MRN: [id number]  6 Days Post-Op Subjective: Pt extubated.  Denies pain.  Objective: Vital signs in last 24 hours: Temp:  [98.5 F (36.9 C)-100.7 F (38.2 C)] 98.5 F (36.9 C) (07/13 2346) Pulse Rate:  [89-124] 104 (07/14 0545) Resp:  [16-32] 18 (07/14 0545) BP: (113-159)/(59-97) 134/78 (07/14 0500) SpO2:  [89 %-100 %] 98 % (07/14 0545) FiO2 (%):  [40 %] 40 % (07/13 1045) Weight:  [91 kg] 91 kg (07/14 0424)  Intake/Output from previous day: 07/13 0701 - 07/14 0700 In: 749 [I.V.:435.6; NG/GT:301.6; IV Piggyback:11.8] Out: 6979 [Urine:6525; Drains:220] Intake/Output this shift: Total I/O In: 191.9 [I.V.:180.1; IV Piggyback:11.8] Out: 2165 [Urine:2075; Drains:90]  Physical Exam:  General: Alert and oriented GU: Urine is pink draining well in tubing.  Lab Results: Recent Labs    01/08/22 0358 01/09/22 0344 01/10/22 0417  HGB 9.0* 8.7* 10.4*  HCT 27.2* 27.2* 32.9*   BMET Recent Labs    01/09/22 0344 01/10/22 0417  NA 149* 146*  K 3.4* 3.6  CL 104 105  CO2 33* 33*  GLUCOSE 133* 103*  BUN 34* 30*  CREATININE 1.19 1.26*  CALCIUM 8.2* 8.2*     Studies/Results: Drain Cr 1.4  Assessment/Plan: 1) Extraperitoneal bladder perforation/clot retention s/p cystotomy with clot evacuation and repair 7/8:  Continue catheter drainage with plans for cystogram about 2 weeks from repair.  Will D/C pelvic drain.     LOS: 13 days   [name] 01/10/2022, 6:51 AM

## 2022-01-10 NOTE — Progress Notes (Incomplete)
Transfer summary:  [name] is a 76 year old gentleman with CAD, aortic stenosis, atrial fibrillation who presented on 7/1 with acute cholecystitis complicated with septic shock.  Patient was subsequently intubated for respiratory distress on 7/2.  He initially had pressor requirements which were discontinued on 7/7.  He required a percutaneous biliary drain placement and completed 7 days of ceftriaxone.  He had worsening renal function with hematuria noted on 7/4 thought to be due to misplacement of urethral catheter along with his anticoagulation for atrial fibrillation.  He developed frank blood and per urology recommendations had a three-way hematuria catheter placed and was began on continuous bladder irrigation.  4 days later he was found to have extraperitoneal bladder rupture with significant clot in the bladder in the setting of continuous CBI.  He was taken to the OR 7/7 for exploratory laparotomy, bladder repair and clot evacuation. His hospital course was complicated by continued acute encephalopathy and mechanical ventilation dependence.  He was successfully extubated on 7/13.   Subjective: No acute overnight events. Patient was comfortably sleeping this AM. He denies any pain or discomfort this morning.  Reports that he does not recall most of his hospitalization.  Lastly he remembers was going to the hospital in [place] for his abdominal pain.  Objective:  Vital signs in last 24 hours: Vitals:   01/10/22 1000 01/10/22 1200 01/10/22 1227 01/10/22 1300  BP: 134/70 135/82  133/68  Pulse: 87 96 93 95  Resp: 19 (!) 26 20 (!) 28  Temp:   99.7 F (37.6 C)   TempSrc:   Axillary   SpO2: 95% 94% 96% (!) 81%  Weight:      Height:       Physical Exam General: Chronically ill-appearing, in no acute distress HEENT: Normocephalic, atraumatic, EOM intact, conjunctiva normal CV: Regular rate and rhythm, no murmurs rubs or gallops Pulm: Minimal bibasilar crackles, normal work of  breathing on supplemental oxygen Abdomen: Soft, nondistended, bowel sounds present, no tenderness to palpation, right percutaneous biliary drain in place with bilious drainage, lower suprapubic incision with staples intact, no drainage MSK: Bilateral 1+ pitting edema, lower extremities slightly cool to touch Skin: Warm and dry Neuro: Alert and oriented x3  GU: Foley catheter in place, red-tinged urine in Foley bag  Assessment/Plan:  Principal Problem:   Acute cholecystitis  [name] is a 76 year old gentleman with CAD, aortic stenosis, atrial fibrillation who presented on 7/1 with acute cholecystitis complicated with septic shock and respiratory failure requiring intubation.  Patient had a percutaneous biliary drain placed and hospital course was complicated by worsening renal function and subsequent bladder rupture requiring surgical repair.   Perforated bladder status post surgical repair Hematuria Patient was noted to have worsening renal function with hematuria noted on 7/4 status post in and out cath.  Urology consulted and patient was started on CBI.  Subsequently developed gross hematuria, found to have a ruptured bladder on CT imaging 7/7 and was taken to the OR for an exploratory laparotomy and bladder repair.  Pelvic drain was discontinued on 7/14.  Patient still has a Foley in place with continued red tinged urine.  Hemoglobin has remained stable.  Pelvic drain was discontinued on 714.  Plans for cystogram about 2 weeks from repair. -Urology following, appreciate recommendations -Continue catheter drainage -Monitor UOP  Sepsis secondary to acute cholecystitis s/p percutaneous drain placement Patient presented in septic shock requiring pressor support.  He was too critically ill to tolerate surgery, so IR placed a  percutaneous cholecystostomy tube was placed on 7/2 and patient completed 7 days of IV ceftriaxone.  Patient weaned off pressor support on 7/7.  He continues to  have biliary output, approximately 135 cc 7P to 7 AM.  Anticipate drain can be removed once drainage tapers off. -IR is following, appreciate assistance -Continue 3 times daily flushes with 5 cc normal saline -Follow drain output every shift -Blood cultures no growth to date -General surgery signed off 7/10, recommended outpatient follow-up  Acute hypoxic respiratory failure Patient was successfully extubated on 7/13, saturating well on supplemental oxygen via 4 L nasal cannula.  Respiratory culture showed abundant white blood cells, rare budding yeast and rare gram-negative rods.  Culture resulted with few Candida albicans and rare Staph aureus. -Pulmonary hygiene -SPO2 goal greater than 92% -Follow tracheal aspirate culture  Leukocytosis Patient's white blood cell count remains elevated, 22.4 today.  Patient treated with 7 days of antibiotics and stress dose steroids.  Sepsis has resolved.  Unclear etiology of persistent leukocytosis, may be multifactorial in the setting of recent sepsis secondary to acute cholecystitis, percutaneous drain placement and perforated bladder.  Though I would anticipate this would downtrend with improvement.  Respiratory culture did show rare Staph aureus and few Candida albicans but do not suspect that this is the cause of continued leukocytosis. -Trend fever curve and WBC -Monitor off antibiotics for now  Acute metabolic encephalopathy, resolved  Patient had ICU/septic delirium which improved.  He was receiving Seroquel and Klonopin which was discontinued 7/14. -Delirium precautions  AKI, resolved Electrolyte disturbances, resolved -Trend BMP  Permanent A-fib on Coumadin Supra therapeutic INR, resolved   Left atrial appendage noted on CT scan though with likely artifact is no correlation on echocardiogram. On admission patient presented with a supra therapeutic INR at 7.3.  This improved to 1.9 last checked on 7/4.  Patient has been on heparin since.   Discussed with urology about when it would be safe to resume Coumadin; once gross hematuria has resolved can discuss resuming Coumadin. -Continue heparin drip -Continue carvedilol -Continue cardiac monitoring  Hypertension CAD Moderate aortic valve stenosis Home medications include carvedilol, losartan furosemide and atorvastatin.  SBP in the 130s this morning.  He appears hypervolemic this morning with lower extremity edema and minimal bibasilar crackles.  Continue carvedilol, restart atorvastatin and furosemide.  If blood pressure rises can resume losartan. -Continue carvedilol -Restart atorvastatin  -Furosemide 20 mg IV  Hyperlipidemia Resume atorvastatin 40 mg daily  Gout Home medications include allopurinol  Prior to Admission Living Arrangement: Lives at home with his wife.  Reports previously was able to perform all of his ADLs/IADLs. Anticipated Discharge Location: SNF versus home  Barriers to Discharge: Clinical improvement, patient still has gross hematuria  Dispo: Anticipated discharge pending clinical improvement.   Richard Monaco N, DO 01/10/2022, 1:17 PM After 5pm on weekdays and 1pm on weekends: On Call pager [phone]

## 2022-01-10 NOTE — Progress Notes (Signed)
ANTICOAGULATION CONSULT NOTE  Pharmacy Consult for Heparin Indication: atrial fibrillation  No Known Allergies  Patient Measurements: Height: '5\' 6"'$  (167.6 cm) Weight: 91 kg (200 lb 9.9 oz) IBW/kg (Calculated) : 63.8 Heparin Dosing Weight: 87.2 kg  Vital Signs: Temp: 98.3 F (36.8 C) (07/14 1546) Temp Source: Oral (07/14 1546) BP: 137/71 (07/14 1600) Pulse Rate: 97 (07/14 1600)  Labs: Recent Labs    01/08/22 0358 01/08/22 1755 01/09/22 0344 01/09/22 0525 01/09/22 2337 01/10/22 0417 01/10/22 1755  HGB 9.0*  --  8.7*  --   --  10.4*  --   HCT 27.2*  --  27.2*  --   --  32.9*  --   PLT 177  --  186  --   --  268  --   HEPARINUNFRC  --    < >  --    < > 0.30 0.25* 0.33  CREATININE 1.28*  --  1.19  --   --  1.26*  --    < > = values in this interval not displayed.     Estimated Creatinine Clearance: 54.3 mL/min (A) (by C-G formula based on SCr of 1.26 mg/dL (H)).   Assessment: 76 yo M with history of Afib on warfarin PTA. Admitted with cholecysititis now s/p percutaneous drain placement. Pt's bladder perforated on 7/7 and is now s/p surgical repair on 7/8 and has been on continuous bladder irrigation (CBI) through 7/12. CBI d/c'd and urology ok with resuming anticoagulation. Pharmacy consulted to dose heparin infusion for Afib.  Heparin level is therapeutic and low normal.  RN reports that blood in foley is getting lighter.  Goal of Therapy:  Heparin level 0.3-0.7 units/ml Monitor platelets by anticoagulation protocol: Yes   Plan:  Increase heparin infusion to 1900 units/hr Monitor CBC, heparin level, and s/sx of bleeding daily  Cerinity Zynda D. Mina Marble, PharmD, BCPS, [place] 01/10/2022, 7:02 PM

## 2022-01-10 NOTE — Evaluation (Signed)
Clinical/Bedside Swallow Evaluation Patient Details  Name: [name] MRN: [id number] Date of Birth: Oct 27, 1945  Today's Date: 01/10/2022 Time: SLP Start Time (ACUTE ONLY): 0932 SLP Stop Time (ACUTE ONLY): 7341 SLP Time Calculation (min) (ACUTE ONLY): 17 min  Past Medical History:  Past Medical History:  Diagnosis Date   Allergic rhinitis 08/08/2013   takes Loratadine daily    Aortic stenosis 09/08/2011   With mild aortic regurgitation, mean gradient 13 mmHg    Cataract    right but immature   Complication of anesthesia    stopped breathing - during shoulder surgery 2009-2010   Coronary artery disease 10/02/2009   Cardiac cath (October 2007): 20% left main, 60% mid LAD, 60-70% distal LAD, 30-40% first diagonal, 30% circumflex, 40% OM, 30-40% RCA    Degenerative joint disease of shoulder 08/03/2013   Bilateral, s/p right total shoulder arthroplasty 05/29/2011 and left shoulder arthroplasty 93/79/0240   Diastolic dysfunction 97/08/5327   Echo (04/04/2016): Grade I   Diverticulosis 10/07/2013   Seen on colonoscopy in 2007    Erectile dysfunction 05/07/2009   Essential hypertension 08/03/2013   had been on Lisinopril but stopped per MD   First degree AV block 03/14/2016   Gastric ulcer 10/07/2013   Seen on EGD 04/10/2006    Hemorrhoids 10/07/2013   Hyperlipidemia 10/02/2009   Paroxysmal atrial fibrillation ([place]) 07/14/2006   One episode, provoked by alcohol use disorder, briefly anticoagulated with warfarin, then switched to aspirin alone   Sciatica associated with disorder of lumbar spine 08/03/2013   Anterolisthesis with right L 4-5 nerve root compression.  Treated with epidural injections and Physical Therapy.    Seborrheic keratosis 10/07/2013   Tubular adenoma of colon    Past Surgical History:  Past Surgical History:  Procedure Laterality Date   CARDIAC CATHETERIZATION  2007   CARDIOVERSION     CARDIOVERSION N/A 08/04/2019   Procedure: CARDIOVERSION;  Surgeon: Fay Records, MD;   Location: [place];  Service: Cardiovascular;  Laterality: N/A;   COLONOSCOPY     CYSTOSCOPY N/A 01/04/2022   Procedure: Consuela Mimes;  Surgeon: Raynelle Bring, MD;  Location: [place];  Service: Urology;  Laterality: N/A;   INSERTION OF MESH N/A 03/11/2019   Procedure: Insertion Of Mesh;  Surgeon: Ralene Ok, MD;  Location: [place];  Service: General;  Laterality: N/A;   IR PERC CHOLECYSTOSTOMY  12/29/2021   JOINT REPLACEMENT     KNEE ARTHROSCOPY WITH MENISCAL REPAIR Right 01/30/2011   LAPAROTOMY N/A 01/04/2022   Procedure: EXPLORATORY LAPAROTOMY WITH EXTRAPERITONEAL BLADDER REPAIR;  Surgeon: Raynelle Bring, MD;  Location: [place];  Service: Urology;  Laterality: N/A;   right finger surgery     as a child   TONSILLECTOMY     TOTAL KNEE ARTHROPLASTY Left 07/28/2017   Procedure: LEFT TOTAL KNEE ARTHROPLASTY;  Surgeon: Renette Butters, MD;  Location: [place];  Service: Orthopedics;  Laterality: Left;   TOTAL SHOULDER ARTHROPLASTY  05/29/2011   Procedure: TOTAL SHOULDER ARTHROPLASTY;  Surgeon: Metta Clines Supple;  Location: [place];  Service: Orthopedics;  Laterality: Right;   TOTAL SHOULDER ARTHROPLASTY Left 06/08/2014   DR SUPPLE   TOTAL SHOULDER ARTHROPLASTY Left 06/08/2014   Procedure: LEFT TOTAL SHOULDER ARTHROPLASTY;  Surgeon: Marin Shutter, MD;  Location: [facility];  Service: Orthopedics;  Laterality: Left;   UMBILICAL HERNIA REPAIR N/A 03/11/2019   Procedure: LAPAROSCOPIC UMBILICAL HERNIA;  Surgeon: Ralene Ok, MD;  Location: [place];  Service: General;  Laterality: N/A;   HPI:  30 Frazier  admitted 7/1 with malaise, Abd pain and cholecystitis. 7/2 IR placed perc chole drain with intubation same date and sepsis. 7/8 pt with extraperitoneal bladder rupture to OR for ex lap, cystotomy with bladder repair. Intubated 7/2-7/14 PMHx: HLD, CAD, AS, HTN, gout, Afib    Assessment / Plan / Recommendation  Clinical Impression  Pt's swallow function has improved from RN's subjective report of pt coughing  yesterday and mostly aphonic, Vocal quality breathy and hypophonic initially but volume increased and quality became hoarse as assessment progressed. Dentures donned with adhesive for timely and efficient manipulation of cracker. He did not cough with cup or straws thin just several delayed throat clears not overly concerning with the larger volumes. Oral-motor strength and ROM adequate. As time increases post extubation opportunity for aspiration decreases. Recommend regular texture with family to order foods, thin liquids, straws allowed with small sips, pills whole in puree and pt will need assist with self feeding. SLP Visit Diagnosis: Dysphagia, unspecified (R13.10)    Aspiration Risk  Mild aspiration risk    Diet Recommendation Regular;Thin liquid   Liquid Administration via: Straw;Cup Medication Administration: Whole meds with puree Supervision: Staff to assist with self feeding Compensations: Slow rate;Small sips/bites Postural Changes: Seated upright at 90 degrees    Other  Recommendations Oral Care Recommendations: Oral care BID    Recommendations for follow up therapy are one component of a multi-disciplinary discharge planning process, led by the attending physician.  Recommendations may be updated based on patient status, additional functional criteria and insurance authorization.  Follow up Recommendations No SLP follow up      Assistance Recommended at Discharge None  Functional Status Assessment Patient has had a recent decline in their functional status and demonstrates the ability to make significant improvements in function in a reasonable and predictable amount of time.  Frequency and Duration min 1 x/week  1 week       Prognosis Prognosis for Safe Diet Advancement: Good      Swallow Study   General Date of Onset: 12/28/21 HPI: [name] admitted 7/1 with malaise, Abd pain and cholecystitis. 7/2 IR placed perc chole drain with intubation same date and sepsis. 7/8 pt  with extraperitoneal bladder rupture to OR for ex lap, cystotomy with bladder repair. Intubated 7/2-7/14 PMHx: HLD, CAD, AS, HTN, gout, Afib Type of Study: Bedside Swallow Evaluation Previous Swallow Assessment:  (none) Diet Prior to this Study: NPO Temperature Spikes Noted: No Respiratory Status: Room air History of Recent Intubation: Yes Length of Intubations (days): 12 days Date extubated: 01/09/22 Behavior/Cognition: Cooperative;Alert;Pleasant mood Oral Cavity Assessment: Within Functional Limits Oral Care Completed by SLP: No Oral Cavity - Dentition: Dentures, top;Dentures, bottom Vision: Functional for self-feeding Self-Feeding Abilities: Needs assist Patient Positioning: Upright in bed Baseline Vocal Quality: Hoarse Volitional Cough: Weak Volitional Swallow: Able to elicit    Oral/Motor/Sensory Function Overall Oral Motor/Sensory Function: Within functional limits   Ice Chips Ice chips: Not tested   Thin Liquid Thin Liquid: Impaired Presentation: Cup;Straw Oral Phase Impairments: Reduced labial seal Oral Phase Functional Implications: Left anterior spillage;Right anterior spillage Pharyngeal  Phase Impairments: Throat Clearing - Immediate;Throat Clearing - Delayed    Nectar Thick Nectar Thick Liquid: Not tested   Honey Thick Honey Thick Liquid: Not tested   Puree Puree: Within functional limits   Solid     Solid: Within functional limits      Houston Siren 01/10/2022,10:47 AM

## 2022-01-10 NOTE — Progress Notes (Signed)
[facility] ADULT ICU REPLACEMENT PROTOCOL   The patient does apply for the [facility] Adult ICU Electrolyte Replacment Protocol based on the criteria listed below:   1.Exclusion criteria: TCTS patients, ECMO patients, and Dialysis patients 2. Is GFR >/= 30 ml/min? Yes.    Patient's GFR today is 60 3. Is SCr </= 2? Yes.   Patient's SCr is 1.26 mg/dL 4. Did SCr increase >/= 0.5 in 24 hours? No. 5.Pt's weight >40kg  Yes.   6. Abnormal electrolyte(s):   K 3.6, Mg 1.9  7. Electrolytes replaced per protocol 8.  Call MD STAT for K+ </= 2.5, Phos </= 1, or Mag </= 1 Physician:  S. Rosie Fate R Shamar Kracke 01/10/2022 5:38 AM

## 2022-01-10 NOTE — Evaluation (Signed)
Occupational Therapy Evaluation Patient Details Name: [name] MRN: [id number] DOB: 04/17/1946 Today's Date: 01/10/2022   History of Present Illness 76 yo admitted 7/1 with malaise, Abd pain and cholecystitis. 7/2 IR placed perc chole drain with intubation same date and sepsis. 7/8 pt with extraperitoneal bladder rupture to OR for ex lap, cystotomy with bladder repair. PMHx: HLD, CAD, AS, HTN, gout, Afib   Clinical Impression   Pt admitted for concerns listed above. PTA pt reported that he was independent with all ADL's and IADL's. At this time pt presents with increased weakness, balance deficits, decreased activity tolerance, and cognitive deficits. He is requiring max A +2 for all mobility and mod-total A for ADL's. Recommending AIR at this time to maximize independence and safety. OT will follow acutely.      Recommendations for follow up therapy are one component of a multi-disciplinary discharge planning process, led by the attending physician.  Recommendations may be updated based on patient status, additional functional criteria and insurance authorization.   Follow Up Recommendations  Acute inpatient rehab (3hours/day)    Assistance Recommended at Discharge Frequent or constant Supervision/Assistance  Patient can return home with the following Two people to help with walking and/or transfers;Two people to help with bathing/dressing/bathroom;Assistance with cooking/housework;Assistance with feeding;Direct supervision/assist for financial management;Direct supervision/assist for medications management;Assist for transportation;Help with stairs or ramp for entrance    Functional Status Assessment  Patient has had a recent decline in their functional status and demonstrates the ability to make significant improvements in function in a reasonable and predictable amount of time.  Equipment Recommendations  BSC/3in1;Tub/shower bench;Wheelchair (measurements OT);Wheelchair cushion  (measurements OT)    Recommendations for Other Services Rehab consult     Precautions / Restrictions Precautions Precautions: Fall;Other (comment) Precaution Comments: lap chole drain, jp drain, penile wounds Restrictions Weight Bearing Restrictions: No      Mobility Bed Mobility Overal bed mobility: Needs Assistance Bed Mobility: Rolling, Sit to Supine Rolling: Mod assist     Sit to supine: Max assist, +2 for physical assistance, +2 for safety/equipment   General bed mobility comments: Max A +2 to return to supine and reposition in bed.    Transfers Overall transfer level: Needs assistance Equipment used: 2 person hand held assist Transfers: Sit to/from Stand, Bed to chair/wheelchair/BSC Sit to Stand: Max assist, +2 physical assistance, +2 safety/equipment Stand pivot transfers: Max assist, +2 physical assistance, +2 safety/equipment         General transfer comment: Pt needing max A +2 to steady and power up. difficulty weight shifting to pivot back tobed      Balance Overall balance assessment: Needs assistance Sitting-balance support: Bilateral upper extremity supported, Feet supported Sitting balance-Leahy Scale: Poor Sitting balance - Comments: min-mod A with BUE supporting, quick to fatigue Postural control: Posterior lean Standing balance support: Bilateral upper extremity supported Standing balance-Leahy Scale: Zero Standing balance comment: Needs max A +2 support                           ADL either performed or assessed with clinical judgement   ADL Overall ADL's : Needs assistance/impaired Eating/Feeding: Minimal assistance;Sitting   Grooming: Minimal assistance;Sitting   Upper Body Bathing: Moderate assistance;Sitting   Lower Body Bathing: Maximal assistance;+2 for physical assistance;+2 for safety/equipment;Sitting/lateral leans;Sit to/from stand   Upper Body Dressing : Moderate assistance;Sitting   Lower Body Dressing: Maximal  assistance;+2 for physical assistance;+2 for safety/equipment;Sitting/lateral leans;Sit to/from stand   Toilet Transfer:  Maximal assistance;+2 for safety/equipment;+2 for physical assistance;Stand-pivot   Toileting- Clothing Manipulation and Hygiene: Total assistance;+2 for physical assistance;+2 for safety/equipment;Sitting/lateral lean;Sit to/from stand         General ADL Comments: Pt is limited by weakness and cognitive deficits     Vision Baseline Vision/History: 1 Wears glasses Ability to See in Adequate Light: 0 Adequate Patient Visual Report: No change from baseline Vision Assessment?: No apparent visual deficits     Perception     Praxis      Pertinent Vitals/Pain Pain Assessment Pain Assessment: Faces Faces Pain Scale: Hurts little more Pain Location: Bottom Pain Descriptors / Indicators: Aching, Discomfort, Grimacing Pain Intervention(s): Limited activity within patient's tolerance, Monitored during session, Repositioned     Hand Dominance Right   Extremity/Trunk Assessment Upper Extremity Assessment Upper Extremity Assessment: Generalized weakness;RUE deficits/detail;LUE deficits/detail RUE Deficits / Details: Difficulty opening hands, unable to flex shoulders past 45 degrees. RUE Sensation: decreased light touch RUE Coordination: decreased fine motor;decreased gross motor LUE Deficits / Details: Difficulty opening hands, unable to flex shoulders past 45 degrees. LUE Sensation: decreased light touch LUE Coordination: decreased fine motor;decreased gross motor   Lower Extremity Assessment Lower Extremity Assessment: Defer to PT evaluation   Cervical / Trunk Assessment Cervical / Trunk Assessment: Kyphotic   Communication Communication Communication: HOH;Expressive difficulties   Cognition Arousal/Alertness: Awake/alert Behavior During Therapy: WFL for tasks assessed/performed Overall Cognitive Status: Impaired/Different from baseline Area of  Impairment: Orientation, Attention, Memory, Following commands, Safety/judgement, Problem solving                 Orientation Level: Disoriented to, Time, Situation Current Attention Level: Focused Memory: Decreased short-term memory Following Commands: Follows one step commands consistently, Follows one step commands with increased time Safety/Judgement: Decreased awareness of safety, Decreased awareness of deficits   Problem Solving: Slow processing General Comments: Pt with memorydeficits, and slowed processing.     General Comments  VS on RA    Exercises     Shoulder Instructions      Home Living Family/patient expects to be discharged to:: Private residence Living Arrangements: Spouse/significant other Available Help at Discharge: Family;Available 24 hours/day Type of Home: House Home Access: Stairs to enter [employer] of Steps: 1   Home Layout: One level     Bathroom Shower/Tub: Occupational psychologist: Standard     Home Equipment: Conservation officer, nature (2 wheels);Wheelchair - manual;Shower seat   Additional Comments: was going to the gym and swimming PTA      Prior Functioning/Environment Prior Level of Function : Independent/Modified Independent                        OT Problem List: Decreased strength;Decreased range of motion;Decreased activity tolerance;Impaired balance (sitting and/or standing);Decreased coordination;Decreased cognition;Decreased safety awareness;Impaired sensation;Impaired UE functional use;Pain      OT Treatment/Interventions: Self-care/ADL training;Therapeutic exercise;Energy conservation;DME and/or AE instruction;Therapeutic activities;Cognitive remediation/compensation;Patient/family education;Balance training    OT Goals(Current goals can be found in the care plan section) Acute Rehab OT Goals Patient Stated Goal: To get stronger and go home OT Goal Formulation: With patient/family Time For Goal  Achievement: 01/24/22 Potential to Achieve Goals: Good ADL Goals Pt Will Perform Grooming: with modified independence;sitting Pt Will Perform Lower Body Bathing: with mod assist;sitting/lateral leans;sit to/from stand Pt Will Perform Lower Body Dressing: with mod assist;sitting/lateral leans;sit to/from stand Pt Will Transfer to Toilet: with mod assist;ambulating Pt Will Perform Toileting - Clothing Manipulation and hygiene: with mod assist;sitting/lateral  leans;sit to/from stand Pt/caregiver will Perform Home Exercise Program: Increased strength;Increased ROM;Both right and left upper extremity;With theraband;With theraputty;With Supervision;With written HEP provided  OT Frequency: Min 2X/week    Co-evaluation              AM-PAC OT "6 Clicks" Daily Activity     Outcome Measure Help from another person eating meals?: A Little Help from another person taking care of personal grooming?: A Little Help from another person toileting, which includes using toliet, bedpan, or urinal?: Total Help from another person bathing (including washing, rinsing, drying)?: A Lot Help from another person to put on and taking off regular upper body clothing?: A Lot Help from another person to put on and taking off regular lower body clothing?: Total 6 Click Score: 12   End of Session Equipment Utilized During Treatment: Gait belt Nurse Communication: Mobility status;Need for lift equipment  Activity Tolerance: Patient limited by fatigue Patient left: in bed;with call bell/phone within reach;with bed alarm set;with family/visitor present  OT Visit Diagnosis: Unsteadiness on feet (R26.81);Other abnormalities of gait and mobility (R26.89);Muscle weakness (generalized) (M62.81)                Time: 2481-8590 OT Time Calculation (min): 40 min Charges:  OT General Charges $OT Visit: 1 Visit OT Evaluation $OT Eval Moderate Complexity: 1 Mod OT Treatments $Self Care/Home Management : 8-22  mins $Therapeutic Activity: 8-22 mins  Maliki Gignac H., OTR/L Acute Rehabilitation  Takyra Cantrall Elane Yolanda Bonine 01/10/2022, 7:41 PM

## 2022-01-10 NOTE — Progress Notes (Signed)
ANTICOAGULATION CONSULT NOTE - Follow-Up Consult  Pharmacy Consult for Heparin infusion Indication: atrial fibrillation  No Known Allergies  Patient Measurements: Height: '5\' 6"'$  (167.6 cm) Weight: 91 kg (200 lb 9.9 oz) IBW/kg (Calculated) : 63.8 Heparin Dosing Weight: 87.2 kg  Vital Signs: Temp: 98.5 F (36.9 C) (07/14 0733) Temp Source: Oral (07/14 0733) BP: 148/65 (07/14 0700) Pulse Rate: 107 (07/14 0700)  Labs: Recent Labs    01/08/22 0358 01/08/22 1755 01/09/22 0344 01/09/22 0525 01/09/22 1414 01/09/22 2337 01/10/22 0417  HGB 9.0*  --  8.7*  --   --   --  10.4*  HCT 27.2*  --  27.2*  --   --   --  32.9*  PLT 177  --  186  --   --   --  268  HEPARINUNFRC  --    < >  --    < > 0.27* 0.30 0.25*  CREATININE 1.28*  --  1.19  --   --   --  1.26*   < > = values in this interval not displayed.     Estimated Creatinine Clearance: 54.3 mL/min (A) (by C-G formula based on SCr of 1.26 mg/dL (H)).   Medical History: Past Medical History:  Diagnosis Date   Allergic rhinitis 08/08/2013   takes Loratadine daily    Aortic stenosis 09/08/2011   With mild aortic regurgitation, mean gradient 13 mmHg    Cataract    right but immature   Complication of anesthesia    stopped breathing - during shoulder surgery 2009-2010   Coronary artery disease 10/02/2009   Cardiac cath (October 2007): 20% left main, 60% mid LAD, 60-70% distal LAD, 30-40% first diagonal, 30% circumflex, 40% OM, 30-40% RCA    Degenerative joint disease of shoulder 08/03/2013   Bilateral, s/p right total shoulder arthroplasty 05/29/2011 and left shoulder arthroplasty 82/50/5397   Diastolic dysfunction 67/08/4191   Echo (04/04/2016): Grade I   Diverticulosis 10/07/2013   Seen on colonoscopy in 2007    Erectile dysfunction 05/07/2009   Essential hypertension 08/03/2013   had been on Lisinopril but stopped per MD   First degree AV block 03/14/2016   Gastric ulcer 10/07/2013   Seen on EGD 04/10/2006    Hemorrhoids  10/07/2013   Hyperlipidemia 10/02/2009   Paroxysmal atrial fibrillation ([place]) 07/14/2006   One episode, provoked by alcohol use disorder, briefly anticoagulated with warfarin, then switched to aspirin alone   Sciatica associated with disorder of lumbar spine 08/03/2013   Anterolisthesis with right L 4-5 nerve root compression.  Treated with epidural injections and Physical Therapy.    Seborrheic keratosis 10/07/2013   Tubular adenoma of colon     Medications:  Medications Prior to Admission  Medication Sig Dispense Refill Last Dose   allopurinol (ZYLOPRIM) 100 MG tablet TAKE 2 TABLETS EVERY DAY (DOSE INCREASE) (Patient taking differently: Take 200 mg by mouth daily.) 180 tablet 3 12/27/2021   atorvastatin (LIPITOR) 40 MG tablet TAKE 1 TABLET EVERY DAY (Patient taking differently: Take 40 mg by mouth daily.) 90 tablet 3 12/27/2021   carvedilol (COREG) 6.25 MG tablet TAKE 1 TABLET TWICE DAILY WITH MEALS (NEED MD APPOINTMENT) (Patient taking differently: Take 6.25 mg by mouth 2 (two) times daily with a meal.) 180 tablet 3 12/28/2021 at 07:30   furosemide (LASIX) 20 MG tablet TAKE 1 TABLET EVERY DAY (Patient taking differently: Take 20 mg by mouth daily.) 90 tablet 3 12/27/2021   losartan (COZAAR) 50 MG tablet TAKE 1 TABLET EVERY DAY (  Patient taking differently: Take 50 mg by mouth daily.) 90 tablet 3 12/28/2021   naproxen sodium (ALEVE) 220 MG tablet Take 440 mg by mouth daily as needed (pain).   12/27/2021   warfarin (COUMADIN) 5 MG tablet Take 1-2 tablets by mouth daily or as directed by Coumadin clinic (Patient taking differently: Take 2.5-5 mg by mouth daily. 2.5 mg daily on Sunday, Tuesday, Thursday, Saturday 5 mg daily on Monday, Wednesday, Friday) 90 tablet 0 12/28/2021 at 07:30    Assessment: 76 yo M with history of Afib on warfarin PTA. Admitted with cholecysititis now s/p percutaneous drain placement. Pt's bladder perforated on 7/7 and is now s/p surgical repair on 7/8 and has been on continuous  bladder irrigation (CBI) through 7/12. CBI d/c'd and urology ok with resuming anticoagulation. Pharmacy consulted to dose heparin infusion for Afib.  Heparin level = 0.25, subtherapeutic Heparin infusion rate: 1550 units/hr Hgb 10.4, stable Plt 268, trending up Urine output continues with same red color today, no clots noted  PTA warfarin dose: 2.'5mg'$  TRSaSu, '5mg'$  MWF Last dose of warfarin 12/28/21 at 07:30  Goal of Therapy:  Heparin level 0.3-0.7 units/ml Monitor platelets by anticoagulation protocol: Yes   Plan:  Increase heparin infusion rate to 1850 units/hr Check heparin level in 8 hours Monitor CBC, heparin level, and s/sx of bleeding daily  Luisa Hart, PharmD, BCPS Clinical Pharmacist 01/10/2022 9:28 AM   Please refer to [facility] for pharmacy phone number

## 2022-01-10 NOTE — Plan of Care (Addendum)
[facility] will take over care of this patient at 7am tomorrow. Nani Gasser, MD 01/10/2022, 1:12 PM

## 2022-01-10 NOTE — Progress Notes (Signed)
ANTICOAGULATION CONSULT NOTE - Follow-Up Consult  Pharmacy Consult for Heparin infusion Indication: atrial fibrillation  No Known Allergies  Patient Measurements: Height: '5\' 6"'$  (167.6 cm) Weight: 96.2 kg (212 lb 1.3 oz) IBW/kg (Calculated) : 63.8 Heparin Dosing Weight: 87.2 kg  Vital Signs: Temp: 98.5 F (36.9 C) (07/13 2346) Temp Source: Oral (07/13 2346) BP: 113/65 (07/13 2200) Pulse Rate: 102 (07/13 2315)  Labs: Recent Labs    01/07/22 0350 01/08/22 0358 01/08/22 1755 01/09/22 0344 01/09/22 0525 01/09/22 1414 01/09/22 2337  HGB 9.3* 9.0*  --  8.7*  --   --   --   HCT 27.2* 27.2*  --  27.2*  --   --   --   PLT 170 177  --  186  --   --   --   HEPARINUNFRC  --   --    < >  --  0.12* 0.27* 0.30  CREATININE 1.58* 1.28*  --  1.19  --   --   --    < > = values in this interval not displayed.     Estimated Creatinine Clearance: 59.2 mL/min (by C-G formula based on SCr of 1.19 mg/dL).   Medical History: Past Medical History:  Diagnosis Date   Allergic rhinitis 08/08/2013   takes Loratadine daily    Aortic stenosis 09/08/2011   With mild aortic regurgitation, mean gradient 13 mmHg    Cataract    right but immature   Complication of anesthesia    stopped breathing - during shoulder surgery 2009-2010   Coronary artery disease 10/02/2009   Cardiac cath (October 2007): 20% left main, 60% mid LAD, 60-70% distal LAD, 30-40% first diagonal, 30% circumflex, 40% OM, 30-40% RCA    Degenerative joint disease of shoulder 08/03/2013   Bilateral, s/p right total shoulder arthroplasty 05/29/2011 and left shoulder arthroplasty 37/85/8850   Diastolic dysfunction 27/12/4126   Echo (04/04/2016): Grade I   Diverticulosis 10/07/2013   Seen on colonoscopy in 2007    Erectile dysfunction 05/07/2009   Essential hypertension 08/03/2013   had been on Lisinopril but stopped per MD   First degree AV block 03/14/2016   Gastric ulcer 10/07/2013   Seen on EGD 04/10/2006    Hemorrhoids 10/07/2013    Hyperlipidemia 10/02/2009   Paroxysmal atrial fibrillation ([place]) 07/14/2006   One episode, provoked by alcohol use disorder, briefly anticoagulated with warfarin, then switched to aspirin alone   Sciatica associated with disorder of lumbar spine 08/03/2013   Anterolisthesis with right L 4-5 nerve root compression.  Treated with epidural injections and Physical Therapy.    Seborrheic keratosis 10/07/2013   Tubular adenoma of colon     Medications:  Medications Prior to Admission  Medication Sig Dispense Refill Last Dose   allopurinol (ZYLOPRIM) 100 MG tablet TAKE 2 TABLETS EVERY DAY (DOSE INCREASE) (Patient taking differently: Take 200 mg by mouth daily.) 180 tablet 3 12/27/2021   atorvastatin (LIPITOR) 40 MG tablet TAKE 1 TABLET EVERY DAY (Patient taking differently: Take 40 mg by mouth daily.) 90 tablet 3 12/27/2021   carvedilol (COREG) 6.25 MG tablet TAKE 1 TABLET TWICE DAILY WITH MEALS (NEED MD APPOINTMENT) (Patient taking differently: Take 6.25 mg by mouth 2 (two) times daily with a meal.) 180 tablet 3 12/28/2021 at 07:30   furosemide (LASIX) 20 MG tablet TAKE 1 TABLET EVERY DAY (Patient taking differently: Take 20 mg by mouth daily.) 90 tablet 3 12/27/2021   losartan (COZAAR) 50 MG tablet TAKE 1 TABLET EVERY DAY (Patient taking differently:  Take 50 mg by mouth daily.) 90 tablet 3 12/28/2021   naproxen sodium (ALEVE) 220 MG tablet Take 440 mg by mouth daily as needed (pain).   12/27/2021   warfarin (COUMADIN) 5 MG tablet Take 1-2 tablets by mouth daily or as directed by Coumadin clinic (Patient taking differently: Take 2.5-5 mg by mouth daily. 2.5 mg daily on Sunday, Tuesday, Thursday, Saturday 5 mg daily on Monday, Wednesday, Friday) 90 tablet 0 12/28/2021 at 07:30    Assessment: 76 yo M with history of Afib on warfarin PTA. Admitted with cholecysititis now s/p percutaneous drain placement. Pt's bladder perforated on 7/7 and is now s/p surgical repair on 7/8 and has been on continuous bladder irrigation  (CBI) through 7/12. CBI d/c'd and urology ok with resuming anticoagulation. Pharmacy consulted to dose heparin infusion for Afib.  Heparin level = 0.27, subtherapeutic Heparin infusion rate: 1550 units/hr Hgb 8.7, stable Plt 186, trending up Urine output is red today, however, per urology "appearance after irrigation is consistent with older blood and no clots noted"  PTA warfarin dose: 2.'5mg'$  TRSaSu, '5mg'$  MWF Last dose of warfarin 12/28/21 at 07:30  7/14 AM update:  Heparin level therapeutic   Goal of Therapy:  Heparin level 0.3-0.7 units/ml Monitor platelets by anticoagulation protocol: Yes   Plan:  Cont heparin at 1700 units/hr Heparin level with AM labs  Narda Bonds, PharmD, [place] Pharmacist Phone: [phone]

## 2022-01-10 NOTE — Progress Notes (Signed)
Nutrition Follow-up  DOCUMENTATION CODES:   Not applicable  INTERVENTION:   Ensure Enlive po TID, each supplement provides 350 kcal and 20 grams of protein.  MVI with minerals daily.  NUTRITION DIAGNOSIS:   Inadequate oral intake related to inability to eat as evidenced by NPO status. Ongoing   GOAL:   Patient will meet greater than or equal to 90% of their needs Unmet  MONITOR:   Vent status, Labs, Weight trends, TF tolerance, I & O's  REASON FOR ASSESSMENT:   Ventilator, Consult Enteral/tube feeding initiation and management (trickle tube feeds)  ASSESSMENT:   76 year old male who presented to the ED on 7/01 with abdominal pain. PMH of PAF, HLD, CAD, HTN. Pt admitted with sepsis secondary to acute cholecystitis.  Discussed patient in ICU rounds and with RN today. Extubated 7/13. S/P bedside swallow evaluation with SLP this morning. Diet advanced to regular. Visited patient at bedside when he was starting to eat lunch. He seemed very tired and stated his appetite was poor. He did not want any Ensure supplements now, but agreed to try one at a later time.   Labs reviewed. Na 146 CBG: [id number]  Medications reviewed and include Senokot-S.  Drain output: 220 ml x 24 hours UOP: 6525 ml x 24 hours I&O: -32.4 L   Diet Order:   Diet Order             Diet regular Room service appropriate? Yes; Fluid consistency: Thin  Diet effective now                   EDUCATION NEEDS:   No education needs have been identified at this time  Skin:  Skin Assessment: Reviewed RN Assessment  Last BM:  7/14 type 6  Height:   Ht Readings from Last 1 Encounters:  01/08/22 '5\' 6"'$  (1.676 m)    Weight:   Wt Readings from Last 1 Encounters:  01/10/22 91 kg    Ideal Body Weight:  64.5 kg  BMI:  Body mass index is 32.38 kg/m.  Estimated Nutritional Needs:   Kcal:  1900-2100  Protein:  105-125 grams  Fluid:  >/= 1.8 L   Lucas Mallow RD, LDN, CNSC Please  refer to Amion for contact information.

## 2022-01-10 NOTE — Progress Notes (Addendum)
NAME:  [name], MRN:  [id number], DOB:  26-Apr-1946, LOS: 9 ADMISSION DATE:  12/28/2021, CONSULTATION DATE: 12/29/2018. REFERRING MD: Dr. Almyra Free CHIEF COMPLAINT: Sepsis  History of Present Illness:  76 year old M admitted 7/1 with several days general malaise, fever, abdominal pain more focused in the right side.  He is notable for admission for supratherapeutic INR which he takes Coumadin for atrial fibrillation.  CT scan of the abdomen demonstrated enlarged gallbladder consistent with cholecystitis and question thrombus versus unopacified blood within LEFT atrial appendage (ruled out). Admitted to ICU. Underwent perc chole drain placement via IR.   7/2 with worsening mentation and hypoxia requiring intubation. Throughout ICU stay with worsening renal function. 7/4 with hematuria s/p I&O cath. Urology consulted. Started on CBI. 7/7 CT A/P with new low-density fluid in the extraperitoneal pelvis, presacral space and bilateral retroperitoneum. There is also new free air in the extraperitoneal pelvis and questionable new small amount of intraperitoneal free air near the bladder. Findings are concerning for bladder rupture. 7/8 urology re-engaged. Taken to OR for exploratory laparotomy and bladder repair. Stay complicated by continued encephalopathy and vent dependence.   Pertinent  Medical History  CAD  AS  Grade I DD  First Degree AV Block  HTN  HLD  PAF - on coumadin Seasonal Allergies   Significant Hospital Events: Including procedures, antibiotic start and stop dates in addition to other pertinent events   7/1 Admitted for cholecystitis, septic shock 7/2 Intubated for rapid respiratory compromise 7/3 Pressor requirement increase, volume overloaded 7/4 Largely unchanged compared to day prior, creatinine increasing, trial of Lasix 7/6 Remains on CBI overnight with improvement of pressors needs after initiation of stress dose steroids  7/7 Propofol stopped, agitation a has remained  intermittent issue.  When patient gets agitated and combative gross hematuria is again observed. 7/8 bladder perf repair by urology d/t CBI  7/10 Tolerating PSV, not following commands.  2 units PRBC.  7/11 PSV, CBI held, no further bleeding. CT head negative.  7/12 heparin challenge 7/13 extubated, diuresed  Interim History / Subjective:  Extubated yesterday, doing well on  Few more PVCs noted today Diuresed well from yesterday, 6.5L Failed bedside swallow yest, pending SLP today Urine pink  No specifics complaints other than being hungry  Objective   Blood pressure (!) 148/65, pulse (!) 107, temperature 98.5 F (36.9 C), temperature source Oral, resp. rate (!) 24, height '5\' 6"'$  (1.676 m), weight 91 kg, SpO2 96 %.    Vent Mode: PRVC FiO2 (%):  [40 %] 40 % Set Rate:  [12 bmp] 12 bmp Vt Set:  [510 mL] 510 mL PEEP:  [5 cmH20] 5 cmH20 Pressure Support:  [5 cmH20] 5 cmH20   Intake/Output Summary (Last 24 hours) at 01/10/2022 0807 Last data filed at 01/10/2022 0700 Gross per 24 hour  Intake 681.12 ml  Output 6745 ml  Net -6063.88 ml   Filed Weights   01/08/22 0830 01/09/22 0500 01/10/22 0424  Weight: 104.6 kg 96.2 kg 91 kg    Examination: General:  chronically ill appearing older male sitting upright in bed in NAD HEENT: MM pink/dry, pupils 3/reactive, very soft and hoarse voice, no stridor Neuro: Awake, oriented to person and year, thought he was in [place].  MAE weakly, f/c CV: irir with some PVCs, +SEM murmur PULM:  non labored, clear throughout GI: obese, soft, bs+, NT, R perc biliary drain in place with bilious drainage, lower suprapubic incsion-staples intact, no drainage, foley with pink urine, some bloody drainage  around meatus, no clots  Extremities: warm/dry, trace generalized, +1 LE edema   Afebrile  UOP 6.5L/ 24hrs Net -32L  Wt 96.2> 91kg  Bilary drain 223m/ 24hrs JP drain 143m 24hrs  Labs reviewed> Na 149> 146, K 3.6, BUN 34> 30, sCr 1.19> 1.26, Mag  1.9, WBC 15.5> 21.6, Hgb 10.4, heparin level 0.25   Resolved Hospital Problem list   Supratherapeutic INR: Resolved with holding coumadin Severe sepsis  Assessment & Plan:   Acute Metabolic Encephalopathy- ICU/septic delirium, improved - delirium precautions - stop Seroquel and klonopin> no longer needed   Perforated Bladder s/p Surgical Repair with Post-Op Hematuria  - Urology following, appreciate input - urine remains pink, no clots, H/H stable - plans to d/c pelvic drain today - keep foley for now - renal function remains stable, continue to closely monitor   Sepsis secondary to Acute Cholecystitis (POA), E-Coli in GB Surgical Culture Perforated Bladder  Leukocytosis  S/p percutaneous drain placement 7/2, appreciate IR assistance. TTE ok.  -ceftriaxone D7/7  -monitor fever curve / WBC trend > WBC up slightly but remains afebrile.  Will need to monitor closely off abx -follow up BC- ngtd, resp culture> rare stap pending final  -monitor GB drain output, stable -try and d/c CVL today, will try for PIV's but may need PICC - CCS signed off 7/10, recs to arrange outpt follow-up  after discharge   Acute Hypoxemic Respiratory Failure - extubated 7/13, doing well thus far on [place].  Continue to wean as able - aggressive pulm hygiene> IS, mobilize w/PT - follow trach asp - hold further diureses today  - pending SLP today, may need cortrak  - aspiration precautions  Acute Renal Failure  Hypernatremia, Hypocalcemia  Hypervolemia Due to volume overload, severe sepsis with shock - hold diureses today - trend renal indices  - replete electrolytes prn   Permanent AF on Coumadin L atrial appendage abnormality on CT- likely artifact, no echo correlate HFpEF - Continue heparin per pharmacy - continue low dose lopressor - monitor for bleeding  - hold duresis today  - K/ mag replete for goal K > 4, Mag> 2  At Risk Malnutrition  - currently NPO, pending SLP today    Hypoglycemia - FBG remain stable, continue trend, SSI prn   Best Practice (right click and "Reselect all SmartList Selections" daily)  Diet/type: NPO  DVT prophylaxis: heparin IV GI prophylaxis: PPI Lines: Central line.  Can d/c today, no longer needed after PIV established x 2 Foley:  Yes, and it is still needed Code Status:  limited- no CPR Last date of multidisciplinary goals of care discussion: IPAL on 7/5.    Pending family update, no family at bedside.   If remains hemodynamically stable, may be able to transfer out to PCU later today  Critical Care Time: n/a      BrKennieth RadACNP [facility] 01/10/2022, 8:07 AM  See Amion for pager If no response to pager, please call PCCM consult pager After 7:00 pm call Elink

## 2022-01-10 NOTE — Progress Notes (Signed)
[facility] Progress Note Patient Name: [name] DOB: 04/12/1946 MRN: [id number]   Date of Service  01/10/2022  HPI/Events of Note  Bedside RN had requested Melatonin but on going into the room via camera, patient had just had a bath and was looking very comfortable / drifting off to sleep spontaneously.  eICU Interventions  Bedside RN feels he no longer needs a sleep aid and she will monitor him for any changes.        Kerry Kass Theon Sobotka 01/10/2022, 10:04 PM

## 2022-01-11 DIAGNOSIS — J9601 Acute respiratory failure with hypoxia: Secondary | ICD-10-CM

## 2022-01-11 DIAGNOSIS — D72829 Elevated white blood cell count, unspecified: Secondary | ICD-10-CM

## 2022-01-11 DIAGNOSIS — Z4682 Encounter for fitting and adjustment of non-vascular catheter: Secondary | ICD-10-CM

## 2022-01-11 DIAGNOSIS — R319 Hematuria, unspecified: Secondary | ICD-10-CM

## 2022-01-11 DIAGNOSIS — N3289 Other specified disorders of bladder: Secondary | ICD-10-CM

## 2022-01-11 LAB — CBC WITH DIFFERENTIAL/PLATELET
Abs Immature Granulocytes: 0.17 10*3/uL — ABNORMAL HIGH (ref 0.00–0.07)
Basophils Absolute: 0.1 10*3/uL (ref 0.0–0.1)
Basophils Relative: 1 %
Eosinophils Absolute: 0.7 10*3/uL — ABNORMAL HIGH (ref 0.0–0.5)
Eosinophils Relative: 3 %
HCT: 29.9 % — ABNORMAL LOW (ref 39.0–52.0)
Hemoglobin: 9.8 g/dL — ABNORMAL LOW (ref 13.0–17.0)
Immature Granulocytes: 1 %
Lymphocytes Relative: 10 %
Lymphs Abs: 2.1 10*3/uL (ref 0.7–4.0)
MCH: 31 pg (ref 26.0–34.0)
MCHC: 32.8 g/dL (ref 30.0–36.0)
MCV: 94.6 fL (ref 80.0–100.0)
Monocytes Absolute: 0.9 10*3/uL (ref 0.1–1.0)
Monocytes Relative: 4 %
Neutro Abs: 17.2 10*3/uL — ABNORMAL HIGH (ref 1.7–7.7)
Neutrophils Relative %: 81 %
Platelets: 276 10*3/uL (ref 150–400)
RBC: 3.16 MIL/uL — ABNORMAL LOW (ref 4.22–5.81)
RDW: 14.6 % (ref 11.5–15.5)
WBC: 21.2 10*3/uL — ABNORMAL HIGH (ref 4.0–10.5)
nRBC: 0.1 % (ref 0.0–0.2)

## 2022-01-11 LAB — BASIC METABOLIC PANEL
Anion gap: 8 (ref 5–15)
BUN: 28 mg/dL — ABNORMAL HIGH (ref 8–23)
CO2: 29 mmol/L (ref 22–32)
Calcium: 8 mg/dL — ABNORMAL LOW (ref 8.9–10.3)
Chloride: 105 mmol/L (ref 98–111)
Creatinine, Ser: 1.19 mg/dL (ref 0.61–1.24)
GFR, Estimated: >60 mL/min (ref >60)
Glucose, Bld: 106 mg/dL — ABNORMAL HIGH (ref 70–99)
Potassium: 3.5 mmol/L (ref 3.5–5.1)
Sodium: 142 mmol/L (ref 135–145)

## 2022-01-11 LAB — CBC
HCT: 31 % — ABNORMAL LOW (ref 39.0–52.0)
Hemoglobin: 10.1 g/dL — ABNORMAL LOW (ref 13.0–17.0)
MCH: 31.2 pg (ref 26.0–34.0)
MCHC: 32.6 g/dL (ref 30.0–36.0)
MCV: 95.7 fL (ref 80.0–100.0)
Platelets: 257 10*3/uL (ref 150–400)
RBC: 3.24 MIL/uL — ABNORMAL LOW (ref 4.22–5.81)
RDW: 15.1 % (ref 11.5–15.5)
WBC: 22.4 10*3/uL — ABNORMAL HIGH (ref 4.0–10.5)
nRBC: 0.1 % (ref 0.0–0.2)

## 2022-01-11 LAB — GLUCOSE, CAPILLARY: Glucose-Capillary: 111 mg/dL — ABNORMAL HIGH (ref 70–99)

## 2022-01-11 LAB — CULTURE, BLOOD (ROUTINE X 2)
Culture: NO GROWTH
Culture: NO GROWTH

## 2022-01-11 LAB — HEPARIN LEVEL (UNFRACTIONATED): Heparin Unfractionated: 0.35 IU/mL (ref 0.30–0.70)

## 2022-01-11 LAB — MAGNESIUM: Magnesium: 2 mg/dL (ref 1.7–2.4)

## 2022-01-11 MED ORDER — ATORVASTATIN CALCIUM 40 MG PO TABS
40.0000 mg | ORAL_TABLET | Freq: Every day | ORAL | Status: DC
  Administered 2022-01-11 – 2022-01-20 (×10): 40 mg via ORAL
  Filled 2022-01-11 (×10): qty 1

## 2022-01-11 MED ORDER — FINASTERIDE 5 MG PO TABS
5.0000 mg | ORAL_TABLET | Freq: Every day | ORAL | Status: DC
  Administered 2022-01-11 – 2022-01-20 (×10): 5 mg via ORAL
  Filled 2022-01-11 (×10): qty 1

## 2022-01-11 MED ORDER — DEGARELIX ACETATE(240 MG DOSE) 120 MG/VIAL ~~LOC~~ SOLR
240.0000 mg | Freq: Once | SUBCUTANEOUS | Status: AC
  Administered 2022-01-11: 240 mg via SUBCUTANEOUS
  Filled 2022-01-11 (×2): qty 6

## 2022-01-11 MED ORDER — FUROSEMIDE 10 MG/ML IJ SOLN
20.0000 mg | Freq: Once | INTRAMUSCULAR | Status: AC
Start: 2022-01-11 — End: 2022-01-11
  Administered 2022-01-11: 20 mg via INTRAVENOUS
  Filled 2022-01-11: qty 2

## 2022-01-11 MED ORDER — POTASSIUM CHLORIDE CRYS ER 20 MEQ PO TBCR
40.0000 meq | EXTENDED_RELEASE_TABLET | Freq: Once | ORAL | Status: AC
  Administered 2022-01-11: 40 meq via ORAL
  Filled 2022-01-11: qty 2

## 2022-01-11 NOTE — Progress Notes (Signed)
1930: During handoff, pt had blood clots in foley catheter tubing. Per day shift nurse, they have become worse throughout the afternoon.   1945: During assessment, more blood clots observed in foley catheter tubing. Notified [employer], DO. Bladder scanned patient: 8 mL. Patient denies any pain or discomfort. Incision on abdomen now pink and cool. Masters coming to bedside.  0530: Patient had dark and bright red blood around meatus - significant increase compared to 0400 assessment. Secure chatted Masters, DO requesting her to come to bedside and assess. Arrived to bedside and advised to turn heparin gtt off. Pharmacy notified heparin gtt on hold. Will continue to monitor.   5258: Patient urine progressed from bright, dark red to tan to yellow.   0630: Patient urine light red.   Lesle Reek, RN

## 2022-01-11 NOTE — Progress Notes (Signed)
7 Days Post-Op   Subjective/Chief Complaint:  1 - Bladder Rupture - s/p open repair of bladder rupture 01/03/22 by Alinda Money. JP Cr 1.4 7/13 (same as serum). JP removed 7/15.   2 - Gross Hematuria - gross hematuria from presuemed BPH in setting of blood thinners durign prolonged hospitalization orignally form sepric cholecystitis.  Today " Abbe [place] " is stable. Hgb 10s back on heparin, but still with some gross hematuira. Continues to tolerate reg diet.    Objective: Vital signs in last 24 hours: Temp:  [97.8 F (36.6 C)-99.7 F (37.6 C)] 97.8 F (36.6 C) (07/15 0308) Pulse Rate:  [87-117] 96 (07/15 0600) Resp:  [18-33] 18 (07/15 0600) BP: (125-159)/(55-104) 151/75 (07/15 0600) SpO2:  [81 %-98 %] 93 % (07/15 0600) Weight:  [87.7 kg] 87.7 kg (07/15 0500) Last BM Date : 01/10/22  Intake/Output from previous day: 07/14 0701 - 07/15 0700 In: 828.3 [P.O.:240; I.V.:423.4; IV Piggyback:150] Out: 0630 [Urine:3700; Drains:245] Intake/Output this shift: Total I/O In: 213.8 [I.V.:208.8; Other:5] Out: 2035 [Urine:1900; Drains:135]  Stigmata of chonic disease, wife at bedse. Pt is obese but frail. Some use of accessory muscles with breathing Obese abdomen, midline incision with staples. C/d/I JP with scan non-foul serosanguinous fluid, removed and dry dressing placed Foley with bloody urine, no clots, thin.  Lab Results:  Recent Labs    01/10/22 0417 01/11/22 0119  WBC 21.6* 22.4*  HGB 10.4* 10.1*  HCT 32.9* 31.0*  PLT 268 257   BMET Recent Labs    01/10/22 0417 01/11/22 0119  NA 146* 142  K 3.6 3.5  CL 105 105  CO2 33* 29  GLUCOSE 103* 106*  BUN 30* 28*  CREATININE 1.26* 1.19  CALCIUM 8.2* 8.0*   PT/INR No results for input(s): "LABPROT", "INR" in the last 72 hours. ABG No results for input(s): "PHART", "HCO3" in the last 72 hours.  Invalid input(s): "PCO2", "PO2"  Studies/Results: No results found.  Anti-infectives: Anti-infectives (From admission, onward)     Start     Dose/Rate Route Frequency Ordered Stop   01/04/22 0900  cefTRIAXone (ROCEPHIN) 1 g in sodium chloride 0.9 % 100 mL IVPB        1 g 200 mL/hr over 30 Minutes Intravenous Every 24 hours 01/04/22 0807 01/11/22 2359   01/01/22 1400  cefTRIAXone (ROCEPHIN) 2 g in sodium chloride 0.9 % 100 mL IVPB  Status:  Discontinued        2 g 200 mL/hr over 30 Minutes Intravenous Every 24 hours 01/01/22 0842 01/03/22 0755   12/28/21 1600  piperacillin-tazobactam (ZOSYN) IVPB 3.375 g  Status:  Discontinued        3.375 g 12.5 mL/hr over 240 Minutes Intravenous Every 8 hours 12/28/21 1419 01/01/22 0842   12/28/21 1600  cefOXitin (MEFOXIN) 2 g in sodium chloride 0.9 % 100 mL IVPB  Status:  Discontinued        2 g 200 mL/hr over 30 Minutes Intravenous To Radiology 12/28/21 1509 12/29/21 1437   12/28/21 1256  vancomycin variable dose per unstable renal function (pharmacist dosing)  Status:  Discontinued         Does not apply See admin instructions 12/28/21 1256 12/28/21 1419   12/28/21 1015  piperacillin-tazobactam (ZOSYN) IVPB 3.375 g        3.375 g 100 mL/hr over 30 Minutes Intravenous  Once 12/28/21 1001 12/28/21 1029   12/28/21 1015  vancomycin (VANCOREADY) IVPB 1750 mg/350 mL        1,750 mg 175 mL/hr  over 120 Minutes Intravenous  Once 12/28/21 1003 12/28/21 1254       Assessment/Plan:  1 - Bladder Rupture - now s/p repair. Keep current foley. JP removed today.   2 - Gross Hematuria - Improving but persistant, esp now heparin restarted.  Started finasteride to help prevent in future. Firmagon 240 x1 today to max decrease prostate vascular growth.    [name] 01/11/2022

## 2022-01-11 NOTE — Progress Notes (Signed)
[facility] ADULT ICU REPLACEMENT PROTOCOL   The patient does apply for the [facility] Adult ICU Electrolyte Replacment Protocol based on the criteria listed below:   1.Exclusion criteria: TCTS patients, ECMO patients, and Dialysis patients 2. Is GFR >/= 30 ml/min? Yes.    Patient's GFR today is >60 3. Is SCr </= 2? Yes.   Patient's SCr is 1.19 mg/dL 4. Did SCr increase >/= 0.5 in 24 hours? No. 5.Pt's weight >40kg  Yes.   6. Abnormal electrolyte(s): k 3.5  7. Electrolytes replaced per protocol 8.  Call MD STAT for K+ </= 2.5, Phos </= 1, or Mag </= 1 Physician:    Ronda Fairly A 01/11/2022 5:21 AM

## 2022-01-11 NOTE — Progress Notes (Signed)
Notified MD that patient's urine is bloodier than this morning.  Also, that his complexion and demeanor has changed.  His complexion is paler and he seems more agitated.  MD ordered CBC/w platelet lab.  Will continue to monitor patient.

## 2022-01-11 NOTE — Progress Notes (Signed)
IMTS paged due to gross hematuria.  [name] is a 76 year old with past medical history of atrial fibrillation currently on heparin who was admitted for acute cholecystitis and hospital course complicated by bladder rupture status post ex lap with bladder repair 7/7.  Denies abdominal pain, alert and oriented x2 O: VS- BP 120/69, HR 90 Constitutional: Chronically-ill appearing Cardiovascular: irregularly irregular, no m/r/g Pulmonary/Chest: normal work of breathing on 4L [place] Abdominal: soft, non-tender, non-distended, staples present to midline with mild erythma noted MSK: normal bulk and tone Neurological: alert & oriented x 2 A: He continues to have gross hematuria following bladder repair.  Patient is hemodynamically stable and repeat CBC stable from 10.1-9.8.  He is being followed by urology and finasteride and degarelix were started today.  P: CBC at 5 AM  Addendum 5AM: Hgb stable, continuing to have bleeding Holding heparin  Spoke with Dr. Tresa Moore and he will see patient again today.

## 2022-01-11 NOTE — Progress Notes (Signed)
ANTICOAGULATION CONSULT NOTE  Pharmacy Consult for Heparin Indication: atrial fibrillation  No Known Allergies  Patient Measurements: Height: '5\' 6"'$  (167.6 cm) Weight: 87.7 kg (193 lb 5.5 oz) IBW/kg (Calculated) : 63.8 Heparin Dosing Weight: 87.2 kg  Vital Signs: Temp: 98.2 F (36.8 C) (07/15 1157) Temp Source: Oral (07/15 1157) BP: 123/68 (07/15 1100) Pulse Rate: 93 (07/15 1100)  Labs: Recent Labs    01/09/22 0344 01/09/22 0525 01/10/22 0417 01/10/22 1755 01/11/22 0119  HGB 8.7*  --  10.4*  --  10.1*  HCT 27.2*  --  32.9*  --  31.0*  PLT 186  --  268  --  257  HEPARINUNFRC  --    < > 0.25* 0.33 0.35  CREATININE 1.19  --  1.26*  --  1.19   < > = values in this interval not displayed.    Estimated Creatinine Clearance: 56.5 mL/min (by C-G formula based on SCr of 1.19 mg/dL).   Assessment: 76 yo M with history of Afib on warfarin PTA. Admitted with cholecysititis now s/p percutaneous drain placement. Pt's bladder perforated on 7/7 and is now s/p surgical repair on 7/8 and has been on continuous bladder irrigation (CBI) through 7/12. CBI d/c'd and urology ok with resuming anticoagulation. Pharmacy consulted to dose heparin infusion for Afib.  Heparin level is therapeutic and low normal.  RN reports that blood in foley is getting lighter but still with some blood-tinged. Urology plans for continuing Heparin until gross hematuria is resolved. H/H stable. Platelets within normal limits.   Goal of Therapy:  Heparin level 0.3-0.7 units/ml Monitor platelets by anticoagulation protocol: Yes   Plan:  Continue heparin infusion at 1900 units/hr Monitor CBC, heparin level, and s/sx of bleeding daily  Sloan Leiter, PharmD, BCPS, BCCCP Clinical Pharmacist Please refer to [facility] for [place] numbers 01/11/2022, 12:58 PM

## 2022-01-12 DIAGNOSIS — K810 Acute cholecystitis: Secondary | ICD-10-CM | POA: Diagnosis not present

## 2022-01-12 LAB — COMPREHENSIVE METABOLIC PANEL
ALT: 18 U/L (ref 0–44)
AST: 35 U/L (ref 15–41)
Albumin: 2.3 g/dL — ABNORMAL LOW (ref 3.5–5.0)
Alkaline Phosphatase: 46 U/L (ref 38–126)
Anion gap: 14 (ref 5–15)
BUN: 31 mg/dL — ABNORMAL HIGH (ref 8–23)
CO2: 30 mmol/L (ref 22–32)
Calcium: 8.3 mg/dL — ABNORMAL LOW (ref 8.9–10.3)
Chloride: 101 mmol/L (ref 98–111)
Creatinine, Ser: 1.34 mg/dL — ABNORMAL HIGH (ref 0.61–1.24)
GFR, Estimated: 56 mL/min — ABNORMAL LOW (ref >60)
Glucose, Bld: 120 mg/dL — ABNORMAL HIGH (ref 70–99)
Potassium: 3.2 mmol/L — ABNORMAL LOW (ref 3.5–5.1)
Sodium: 145 mmol/L (ref 135–145)
Total Bilirubin: 1.1 mg/dL (ref 0.3–1.2)
Total Protein: 6.1 g/dL — ABNORMAL LOW (ref 6.5–8.1)

## 2022-01-12 LAB — CBC WITH DIFFERENTIAL/PLATELET
Abs Immature Granulocytes: 0.16 10*3/uL — ABNORMAL HIGH (ref 0.00–0.07)
Basophils Absolute: 0.1 10*3/uL (ref 0.0–0.1)
Basophils Relative: 1 %
Eosinophils Absolute: 0.6 10*3/uL — ABNORMAL HIGH (ref 0.0–0.5)
Eosinophils Relative: 4 %
HCT: 30.1 % — ABNORMAL LOW (ref 39.0–52.0)
Hemoglobin: 9.9 g/dL — ABNORMAL LOW (ref 13.0–17.0)
Immature Granulocytes: 1 %
Lymphocytes Relative: 11 %
Lymphs Abs: 1.9 10*3/uL (ref 0.7–4.0)
MCH: 30.8 pg (ref 26.0–34.0)
MCHC: 32.9 g/dL (ref 30.0–36.0)
MCV: 93.8 fL (ref 80.0–100.0)
Monocytes Absolute: 0.8 10*3/uL (ref 0.1–1.0)
Monocytes Relative: 5 %
Neutro Abs: 14.5 10*3/uL — ABNORMAL HIGH (ref 1.7–7.7)
Neutrophils Relative %: 78 %
Platelets: 255 10*3/uL (ref 150–400)
RBC: 3.21 MIL/uL — ABNORMAL LOW (ref 4.22–5.81)
RDW: 14.6 % (ref 11.5–15.5)
WBC: 18.2 10*3/uL — ABNORMAL HIGH (ref 4.0–10.5)
nRBC: 0.1 % (ref 0.0–0.2)

## 2022-01-12 LAB — MAGNESIUM: Magnesium: 2 mg/dL (ref 1.7–2.4)

## 2022-01-12 LAB — HEPARIN LEVEL (UNFRACTIONATED): Heparin Unfractionated: 0.42 IU/mL (ref 0.30–0.70)

## 2022-01-12 MED ORDER — POTASSIUM CHLORIDE CRYS ER 20 MEQ PO TBCR
40.0000 meq | EXTENDED_RELEASE_TABLET | Freq: Once | ORAL | Status: AC
Start: 2022-01-12 — End: 2022-01-12
  Administered 2022-01-12: 40 meq via ORAL
  Filled 2022-01-12: qty 2

## 2022-01-12 MED ORDER — FUROSEMIDE 10 MG/ML IJ SOLN
20.0000 mg | Freq: Once | INTRAMUSCULAR | Status: AC
  Administered 2022-01-12: 20 mg via INTRAVENOUS
  Filled 2022-01-12: qty 2

## 2022-01-12 MED ORDER — POTASSIUM CHLORIDE 20 MEQ PO PACK
40.0000 meq | PACK | Freq: Once | ORAL | Status: AC
  Administered 2022-01-12: 40 meq via ORAL
  Filled 2022-01-12: qty 2

## 2022-01-12 MED ORDER — CARVEDILOL 6.25 MG PO TABS
6.2500 mg | ORAL_TABLET | Freq: Two times a day (BID) | ORAL | Status: DC
  Administered 2022-01-12 – 2022-01-20 (×16): 6.25 mg via ORAL
  Filled 2022-01-12 (×16): qty 1

## 2022-01-12 MED ORDER — POTASSIUM CHLORIDE CRYS ER 20 MEQ PO TBCR
40.0000 meq | EXTENDED_RELEASE_TABLET | Freq: Once | ORAL | Status: DC
  Filled 2022-01-12: qty 2

## 2022-01-12 NOTE — Progress Notes (Signed)
Subjective:   Hospital day:15  Overnight event: Continued to have gross hematuria, repeat CBC showed hemoglobin stable at 9.8. Urology consulted this morning and recommended close monitoring off of heparin.  Interim History: Patient evaluated at the bedside sitting comfortably in chair.  Answers or orientation questions appropriately. Patient denies any chest pain, abdominal pain, fevers chills, chest pain or worsening shortness of breath. Per RN, patient has had loss of appetite and decreased p.o. intake however patient states he just did not like the pancake this morning.  He continues to have gross hematuria.  Objective:  Vital signs in last 24 hours: Vitals:   01/12/22 0300 01/12/22 0304 01/12/22 0400 01/12/22 0500  BP: (!) 118/53  (!) 127/54 122/70  Pulse: 94  91 93  Resp: 18  (!) 24 (!) 23  Temp:  97.9 F (36.6 C)    TempSrc:  Oral    SpO2: 95%  99% 91%  Weight:      Height:        Filed Weights   01/09/22 0500 01/10/22 0424 01/11/22 0500  Weight: 96.2 kg 91 kg 87.7 kg     Intake/Output Summary (Last 24 hours) at 01/12/2022 0609 Last data filed at 01/12/2022 0500 Gross per 24 hour  Intake 677.34 ml  Output 2675 ml  Net -1997.66 ml   Net IO Since Admission: -09,470.85 mL [01/12/22 0609]  Recent Labs    01/10/22 1545 01/10/22 1905 01/11/22 0306  GLUCAP 116* 119* 111*     Pertinent Labs:    Latest Ref Rng & Units 01/12/2022    1:16 AM 01/11/2022    8:37 PM 01/11/2022    1:19 AM  CBC  WBC 4.0 - 10.5 K/uL 18.2  21.2  22.4   Hemoglobin 13.0 - 17.0 g/dL 9.9  9.8  10.1   Hematocrit 39.0 - 52.0 % 30.1  29.9  31.0   Platelets 150 - 400 K/uL 255  276  257        Latest Ref Rng & Units 01/12/2022    1:16 AM 01/11/2022    1:19 AM 01/10/2022    4:17 AM  CMP  Glucose 70 - 99 mg/dL 120  106  103   BUN 8 - 23 mg/dL '31  28  30   '$ Creatinine 0.61 - 1.24 mg/dL 1.34  1.19  1.26   Sodium 135 - 145 mmol/L 145  142  146   Potassium 3.5 - 5.1 mmol/L 3.2  3.5  3.6    Chloride 98 - 111 mmol/L 101  105  105   CO2 22 - 32 mmol/L 30  29  33   Calcium 8.9 - 10.3 mg/dL 8.3  8.0  8.2   Total Protein 6.5 - 8.1 g/dL 6.1     Total Bilirubin 0.3 - 1.2 mg/dL 1.1     Alkaline Phos 38 - 126 U/L 46     AST 15 - 41 U/L 35     ALT 0 - 44 U/L 18       Imaging: No results found.  Physical Exam  General: Pleasant, chronically ill-appearing elderly man sitting in chair. No acute distress. CV: Mild tachycardia. Irregularly irregular rhythm. Trace BLE edema Pulmonary: On 2 L [place] lungs CTAB. Normal effort. Distant rales at the bases. No wheezing. Abdominal: Soft, NT/ND. Right perc biliary drain w/ bilious drainage.  Extremities: 2+ distal pulses. Normal ROM. Skin: Warm and dry. No obvious rash or lesions. Neuro: Alert and oriented x3.  Moves all extremities.  Normal sensation to gross touch. Psych: Normal mood and affect   Assessment/Plan: NTHONY [name] is a 76 y.o. male with hx of CAD, aortic stenosis, atrial fibrillation who presented on 7/1 with acute cholecystitis complicated with septic shock and respiratory failure requiring intubation. Patient had a percutaneous biliary drain placed and hospital course was complicated by worsening renal function and subsequent bladder rupture requiring surgical repair 7/08.  Successfully extubated on 7/13 and transferred to IMTS on 7/15.  Principal Problem:   Acute cholecystitis Active Problems:   Acute respiratory failure with hypoxia (HCC)   Encounter for biliary drainage tube placement   Hematuria   Extraperitoneal rupture of bladder   Leukocytosis  Perforated bladder status post surgical repair Gross hematuria Patient was noted to have worsening renal function with hematuria noted on 7/4 status post in and out cath.  Urology consulted and patient was started on CBI.  Subsequently developed gross hematuria, found to have a ruptured bladder on CT imaging 7/7 and was taken to the OR for an exploratory laparotomy and  bladder repair.  Pelvic drain was discontinued on 7/14. Foley with increased gross hematuria overnight.  Repeat CBC overnight was stable at 9.8.  Heparin was discontinued.  Urology was consulted and agreed with plan to hold heparin with continue monitoring of gross hematuria which should improve over the next 48 to 72 hours. -Urology following, appreciate recs --Firmagon 240 x1 given on 7/15 --Continue finasteride 5 mg daily -Continue catheter drainage -Closely monitor UOP --Cystogram about 2 weeks from repair --Trend CBC, transfuse for hgb < 8  Acute hypoxic respiratory failure Patient was successfully extubated on 7/13, saturating well on supplemental oxygen via 4 L nasal cannula.  Patient remained on 4 L [place] overnight. Turned down oxygen to 2 L [place] and patient able to maintain O2 sats above 94%. -Continue pulmonary hygiene --IV Lasix 20 mg x 1 dose -SPO2 goal greater than 92% -Follow tracheal aspirate culture  Leukocytosis, improving Sepsis secondary to acute cholecystitis s/p percutaneous drain placement, resolved Patient presented in septic shock requiring pressor support. He was too critically ill to tolerate surgery, so IR placed a percutaneous cholecystostomy tube was placed on 7/2 and patient completed 7 days of IV ceftriaxone. Patient weaned off pressor support on 7/7. Patient continues to have greater than 100 cc of biliary drain from his PERC drain. Leukocytosis slowly improving, patient remains afebrile. -IR is following, appreciate assistance -Continue 3 times daily flushes with 5 cc normal saline -Follow drain output every shift -General surgery signed off 7/10, recommended outpatient follow-up -Trend fever curve and WBC  Acute metabolic encephalopathy, resolved  Patient had ICU/septic delirium which improved. He was receiving Seroquel and Klonopin which was discontinued 7/14. Patient able to answer all orientation questions appropriately. - Continue delirium  precautions  Permanent A-fib on Coumadin Supra therapeutic INR, resolved   Left atrial appendage noted on CT scan though with likely artifact is no correlation on echocardiogram. On admission patient presented with a supra therapeutic INR at 7.3. This improved to 1.9 last checked on 7/4.  Heparin restarted but subsequently discontinued last night due to worsening of gross hematuria.  HR slightly elevated to the 90s 200s. - Increase Coreg to 6.25 mg twice daily, patient's home dose - Continue to hold anticoagulation   Hypertension CAD Moderate aortic valve stenosis Home medications include carvedilol, losartan furosemide and atorvastatin. Continues to remain normotensive with SBP in the 110s to 130s. Had a good response to IV Lasix 20 mg with UOP of  2.6 L in the last 24 hours.  Overall -14.5 L since admission.  - Coreg 6.25 mg BID as above --Continue atorvastatin 40 mg daily -Furosemide 20 mg IV x1 dose  AKI: Mild bump in creatinine from 1.19-1.34 today. Continue to monitor closely with diuresis.  Trend BMP. Hypokalemia: K+ 3.2 this morning, s/p Kcl 40 mEq Hyperlipidemia: Continue home atorvastatin 40 mg daily Gout: Holding home allopurinol for now   Diet: Regular IVF: None VTE: SCDs CODE: Partial, no CPR  Prior to Admission Living Arrangement: Home Anticipated Discharge Location: Pending Barriers to Discharge: Medical stability Dispo: Anticipated discharge in approximately 3-4 day(s).   Signed: Lacinda Axon, MD 01/12/2022, 6:09 AM  Pager: [phone] Internal Medicine Teaching Service After 5pm on weekdays and 1pm on weekends: On Call pager: [phone]

## 2022-01-12 NOTE — Progress Notes (Signed)
8 Days Post-Op   Subjective/Chief Complaint:  1 - Bladder Rupture - s/p open repair of bladder rupture 01/03/22 by [name]. JP Cr 1.4 7/13 (same as serum). JP removed 7/15.   2 - Gross Hematuria - gross hematuria from presuemed BPH in setting of blood thinners durign prolonged hospitalization orignally form septic cholecystitis. His indication is just AFib. Firmagon 240 given 7/15.   Today " [name] " is stable. Hgb stable at 10s but persistent hematuria. Holding heparin again.  Continues to tolerate reg diet and overall feeling improved.    Objective: Vital signs in last 24 hours: Temp:  [97.8 F (36.6 C)-98.8 F (37.1 C)] 98.8 F (37.1 C) (07/16 0713) Pulse Rate:  [82-108] 88 (07/16 0700) Resp:  [17-29] 22 (07/16 0700) BP: (102-156)/(53-143) 123/69 (07/16 0700) SpO2:  [76 %-99 %] 94 % (07/16 0700) Weight:  [85.6 kg] 85.6 kg (07/16 0500) Last BM Date : 01/11/22  Intake/Output from previous day: 07/15 0701 - 07/16 0700 In: 692.7 [P.O.:240; I.V.:452.7] Out: 3825 [Urine:3825] Intake/Output this shift: No intake/output data recorded.  Stigmata of chonic disease, wife at bedse. Pt is obese but frail. Some use of accessory muscles with breathing Obese abdomen, midline incision with staples. C/d/I Chole drain I place with yellowish non-foul fluid.  Foley with bloody urine, no clots, thin. Improved from yesterday.   Lab Results:  Recent Labs    01/11/22 2037 01/12/22 0116  WBC 21.2* 18.2*  HGB 9.8* 9.9*  HCT 29.9* 30.1*  PLT 276 255   BMET Recent Labs    01/11/22 0119 01/12/22 0116  NA 142 145  K 3.5 3.2*  CL 105 101  CO2 29 30  GLUCOSE 106* 120*  BUN 28* 31*  CREATININE 1.19 1.34*  CALCIUM 8.0* 8.3*   PT/INR No results for input(s): "LABPROT", "INR" in the last 72 hours. ABG No results for input(s): "PHART", "HCO3" in the last 72 hours.  Invalid input(s): "PCO2", "PO2"  Studies/Results: No results found.  Anti-infectives: Anti-infectives (From admission,  onward)    Start     Dose/Rate Route Frequency Ordered Stop   01/04/22 0900  cefTRIAXone (ROCEPHIN) 1 g in sodium chloride 0.9 % 100 mL IVPB        1 g 200 mL/hr over 30 Minutes Intravenous Every 24 hours 01/04/22 0807 01/11/22 1930   01/01/22 1400  cefTRIAXone (ROCEPHIN) 2 g in sodium chloride 0.9 % 100 mL IVPB  Status:  Discontinued        2 g 200 mL/hr over 30 Minutes Intravenous Every 24 hours 01/01/22 0842 01/03/22 0755   12/28/21 1600  piperacillin-tazobactam (ZOSYN) IVPB 3.375 g  Status:  Discontinued        3.375 g 12.5 mL/hr over 240 Minutes Intravenous Every 8 hours 12/28/21 1419 01/01/22 0842   12/28/21 1600  cefOXitin (MEFOXIN) 2 g in sodium chloride 0.9 % 100 mL IVPB  Status:  Discontinued        2 g 200 mL/hr over 30 Minutes Intravenous To Radiology 12/28/21 1509 12/29/21 1437   12/28/21 1256  vancomycin variable dose per unstable renal function (pharmacist dosing)  Status:  Discontinued         Does not apply See admin instructions 12/28/21 1256 12/28/21 1419   12/28/21 1015  piperacillin-tazobactam (ZOSYN) IVPB 3.375 g        3.375 g 100 mL/hr over 30 Minutes Intravenous  Once 12/28/21 1001 12/28/21 1029   12/28/21 1015  vancomycin (VANCOREADY) IVPB 1750 mg/350 mL  1,750 mg 175 mL/hr over 120 Minutes Intravenous  Once 12/28/21 1003 12/28/21 1254       Assessment/Plan:  1 - Bladder Rupture - now s/p repair. Keep current foley. He will need cystogram in about 2 weeks or so after repair to verify bladder integrity before catheter removal, we will manage.   2 - Gross Hematuria - Persistant. This will be very slow to resolve. Agree with hold hepairn in setting of active gross bleeding.  His AFib is relatively soft indication anyway for blood thinners.   Started finasteride to help prevent in future. Firmagon 240 x1given to max decrease prostate vascular growth. Suspect we will see substantial clinical improvement over next 72 hours as this takes effect.   Will  follow.   Greatly appreciate IM team comanagment.    [name] 01/12/2022

## 2022-01-12 NOTE — Plan of Care (Signed)
  Problem: Education: Goal: Knowledge of General Education information will improve Description: Including pain rating scale, medication(s)/side effects and non-pharmacologic comfort measures Outcome: Progressing   Problem: Health Behavior/Discharge Planning: Goal: Ability to manage health-related needs will improve Outcome: Progressing   Problem: Clinical Measurements: Goal: Ability to maintain clinical measurements within normal limits will improve Outcome: Progressing Goal: Will remain free from infection Outcome: Progressing Goal: Diagnostic test results will improve Outcome: Progressing Goal: Respiratory complications will improve Outcome: Progressing Goal: Cardiovascular complication will be avoided Outcome: Progressing   Problem: Activity: Goal: Risk for activity intolerance will decrease Outcome: Progressing   Problem: Nutrition: Goal: Adequate nutrition will be maintained Outcome: Progressing   Problem: Coping: Goal: Level of anxiety will decrease Outcome: Progressing   Problem: Elimination: Goal: Will not experience complications related to bowel motility Outcome: Progressing Goal: Will not experience complications related to urinary retention Outcome: Progressing   Problem: Pain Managment: Goal: General experience of comfort will improve Outcome: Progressing   Problem: Safety: Goal: Ability to remain free from injury will improve Outcome: Progressing   Problem: Skin Integrity: Goal: Risk for impaired skin integrity will decrease Outcome: Progressing   Problem: Education: Goal: Ability to describe self-care measures that may prevent or decrease complications (Diabetes Survival Skills Education) will improve Outcome: Progressing Goal: Individualized Educational Video(s) Outcome: Progressing   Problem: Coping: Goal: Ability to adjust to condition or change in health will improve Outcome: Progressing   Problem: Fluid Volume: Goal: Ability to  maintain a balanced intake and output will improve Outcome: Progressing   Problem: Health Behavior/Discharge Planning: Goal: Ability to identify and utilize available resources and services will improve Outcome: Progressing Goal: Ability to manage health-related needs will improve Outcome: Progressing   Problem: Metabolic: Goal: Ability to maintain appropriate glucose levels will improve Outcome: Progressing   Problem: Nutritional: Goal: Maintenance of adequate nutrition will improve Outcome: Progressing Goal: Progress toward achieving an optimal weight will improve Outcome: Progressing   Problem: Skin Integrity: Goal: Risk for impaired skin integrity will decrease Outcome: Progressing   Problem: Tissue Perfusion: Goal: Adequacy of tissue perfusion will improve Outcome: Progressing   Problem: Safety: Goal: Non-violent Restraint(s) Outcome: Progressing   Problem: Education: Goal: Knowledge of General Education information will improve Description: Including pain rating scale, medication(s)/side effects and non-pharmacologic comfort measures Outcome: Progressing   Problem: Health Behavior/Discharge Planning: Goal: Ability to manage health-related needs will improve Outcome: Progressing   Problem: Clinical Measurements: Goal: Ability to maintain clinical measurements within normal limits will improve Outcome: Progressing Goal: Will remain free from infection Outcome: Progressing Goal: Diagnostic test results will improve Outcome: Progressing Goal: Respiratory complications will improve Outcome: Progressing Goal: Cardiovascular complication will be avoided Outcome: Progressing   Problem: Activity: Goal: Risk for activity intolerance will decrease Outcome: Progressing   Problem: Nutrition: Goal: Adequate nutrition will be maintained Outcome: Progressing   Problem: Coping: Goal: Level of anxiety will decrease Outcome: Progressing   Problem:  Elimination: Goal: Will not experience complications related to bowel motility Outcome: Progressing Goal: Will not experience complications related to urinary retention Outcome: Progressing   Problem: Pain Managment: Goal: General experience of comfort will improve Outcome: Progressing   Problem: Safety: Goal: Ability to remain free from injury will improve Outcome: Progressing   Problem: Skin Integrity: Goal: Risk for impaired skin integrity will decrease Outcome: Progressing

## 2022-01-13 DIAGNOSIS — K810 Acute cholecystitis: Secondary | ICD-10-CM | POA: Diagnosis not present

## 2022-01-13 LAB — CBC
HCT: 31.4 % — ABNORMAL LOW (ref 39.0–52.0)
Hemoglobin: 10.3 g/dL — ABNORMAL LOW (ref 13.0–17.0)
MCH: 31 pg (ref 26.0–34.0)
MCHC: 32.8 g/dL (ref 30.0–36.0)
MCV: 94.6 fL (ref 80.0–100.0)
Platelets: 262 10*3/uL (ref 150–400)
RBC: 3.32 MIL/uL — ABNORMAL LOW (ref 4.22–5.81)
RDW: 14.6 % (ref 11.5–15.5)
WBC: 18.1 10*3/uL — ABNORMAL HIGH (ref 4.0–10.5)
nRBC: 0.1 % (ref 0.0–0.2)

## 2022-01-13 LAB — BASIC METABOLIC PANEL
Anion gap: 13 (ref 5–15)
BUN: 30 mg/dL — ABNORMAL HIGH (ref 8–23)
CO2: 28 mmol/L (ref 22–32)
Calcium: 8.2 mg/dL — ABNORMAL LOW (ref 8.9–10.3)
Chloride: 100 mmol/L (ref 98–111)
Creatinine, Ser: 1.22 mg/dL (ref 0.61–1.24)
GFR, Estimated: >60 mL/min (ref >60)
Glucose, Bld: 106 mg/dL — ABNORMAL HIGH (ref 70–99)
Potassium: 3.6 mmol/L (ref 3.5–5.1)
Sodium: 141 mmol/L (ref 135–145)

## 2022-01-13 LAB — MAGNESIUM: Magnesium: 1.9 mg/dL (ref 1.7–2.4)

## 2022-01-13 MED ORDER — ENOXAPARIN SODIUM 40 MG/0.4ML IJ SOSY
40.0000 mg | PREFILLED_SYRINGE | INTRAMUSCULAR | Status: DC
Start: 2022-01-13 — End: 2022-01-18
  Administered 2022-01-13 – 2022-01-17 (×5): 40 mg via SUBCUTANEOUS
  Filled 2022-01-13 (×5): qty 0.4

## 2022-01-13 NOTE — Progress Notes (Signed)
Occupational Therapy Treatment Patient Details Name: [name] MRN: [id number] DOB: [birth year] Today's Date: 01/13/2022   History of present illness 76 yo admitted 7/1 with malaise, Abd pain and cholecystitis. 7/2 IR placed perc chole drain with intubation same date and sepsis. 7/8 pt with extraperitoneal bladder rupture to OR for ex lap, cystotomy with bladder repair. PMHx: HLD, CAD, AS, HTN, gout, Afib   OT comments  Pt making excellent progress towards OT goals, able to progress short distance mobility with Min A x 2 using RW. Pt continues to have posterior sway in standing requiring hands on assist at all times while standing at sink for ADLs. Emphasized compensatory strategies for LB dressing with Mod A required for completion, as well as energy conservation (breathing techniques, rest breaks d/t fatigue). Pt's wife present and excited by pt progress, still hopeful for intensive AIR level therapies to maximize safety and independence with daily tasks.  BP WFL post activity, HR <120s   Recommendations for follow up therapy are one component of a multi-disciplinary discharge planning process, led by the attending physician.  Recommendations may be updated based on patient status, additional functional criteria and insurance authorization.    Follow Up Recommendations  Acute inpatient rehab (3hours/day)    Assistance Recommended at Discharge Frequent or constant Supervision/Assistance  Patient can return home with the following  A lot of help with walking and/or transfers;A lot of help with bathing/dressing/bathroom;Assistance with cooking/housework;Direct supervision/assist for medications management;Direct supervision/assist for financial management;Assist for transportation;Help with stairs or ramp for entrance   Equipment Recommendations  BSC/3in1;Tub/shower bench    Recommendations for Other Services Rehab consult    Precautions / Restrictions Precautions Precautions:  Fall;Other (comment) Precaution Comments: lap chole drain, jp drain, penile wounds Restrictions Weight Bearing Restrictions: No       Mobility Bed Mobility               General bed mobility comments: received sitting EOB with PT    Transfers Overall transfer level: Needs assistance Equipment used: Rolling walker (2 wheels) Transfers: Sit to/from Stand Sit to Stand: Min assist           General transfer comment: Steadying assist and cues for hand placement from bedside with RW     Balance Overall balance assessment: Needs assistance Sitting-balance support: Bilateral upper extremity supported, Feet supported, No upper extremity supported Sitting balance-Leahy Scale: Fair   Postural control: Posterior lean Standing balance support: Bilateral upper extremity supported Standing balance-Leahy Scale: Poor Standing balance comment: reliant on UE support in standing, posterior sway without external support                           ADL either performed or assessed with clinical judgement   ADL Overall ADL's : Needs assistance/impaired     Grooming: Minimal assistance;Standing;Wash/dry face Grooming Details (indicate cue type and reason): assist to maintain balance with posterior sway noted when support not provided standing at sink             Lower Body Dressing: Moderate assistance;Sitting/lateral leans Lower Body Dressing Details (indicate cue type and reason): able to doff socks w/o assist though fatigued when it was time to don clean socks. able to form figure four position easily though required assist to don socks over toes - pt able to complete the rest of task w/o assist             Functional mobility during ADLs: Minimal assistance;+2  for physical assistance;+2 for safety/equipment;Rolling walker (2 wheels);Cueing for sequencing;Cueing for safety General ADL Comments: Focus on progression on gait at start of session in collab with PT  progressing to LB ADL strategies and standing balance at sink    Extremity/Trunk Assessment Upper Extremity Assessment Upper Extremity Assessment: LUE deficits/detail;RUE deficits/detail RUE Deficits / Details: unable to flex shoulders past 45 degrees, able to grasp ADL items functionally though difficulty manipulating RUE Sensation: decreased light touch RUE Coordination: decreased fine motor;decreased gross motor LUE Deficits / Details: unable to flex shoulders past 45 degrees. LUE Sensation: decreased light touch LUE Coordination: decreased fine motor;decreased gross motor   Lower Extremity Assessment Lower Extremity Assessment: Defer to PT evaluation        Vision   Vision Assessment?: No apparent visual deficits   Perception     Praxis      Cognition Arousal/Alertness: Awake/alert Behavior During Therapy: WFL for tasks assessed/performed Overall Cognitive Status: Impaired/Different from baseline Area of Impairment: Attention, Memory, Following commands, Safety/judgement, Problem solving, Awareness                   Current Attention Level: Sustained Memory: Decreased short-term memory Following Commands: Follows one step commands consistently, Follows one step commands with increased time Safety/Judgement: Decreased awareness of safety, Decreased awareness of deficits Awareness: Intellectual Problem Solving: Slow processing, Requires verbal cues, Requires tactile cues General Comments: pleasant, participatory, some decreased insight into (balance) deficits. follows commands consistently but also benefits from safety cues for DME use        Exercises      Shoulder Instructions       General Comments Wife present and supportive. still hoping for rehab to further progress as pt at increased risk for falls    Pertinent Vitals/ Pain       Pain Assessment Pain Assessment: Faces Faces Pain Scale: Hurts little more Pain Location: abdomen Pain Descriptors /  Indicators: Sore Pain Intervention(s): Monitored during session  Home Living                                          Prior Functioning/Environment              Frequency  Min 2X/week        Progress Toward Goals  OT Goals(current goals can now be found in the care plan section)  Progress towards OT goals: Progressing toward goals  Acute Rehab OT Goals Patient Stated Goal: get back to walking, doing everything i was before OT Goal Formulation: With patient/family Time For Goal Achievement: 01/24/22 Potential to Achieve Goals: Good ADL Goals Pt Will Perform Grooming: with modified independence;sitting Pt Will Perform Lower Body Bathing: with mod assist;sitting/lateral leans;sit to/from stand Pt Will Perform Lower Body Dressing: with mod assist;sitting/lateral leans;sit to/from stand Pt Will Transfer to Toilet: with mod assist;ambulating Pt Will Perform Toileting - Clothing Manipulation and hygiene: with mod assist;sitting/lateral leans;sit to/from stand Pt/caregiver will Perform Home Exercise Program: Increased strength;Increased ROM;Both right and left upper extremity;With theraband;With theraputty;With Supervision;With written HEP provided  Plan Discharge plan remains appropriate    Co-evaluation    PT/OT/SLP Co-Evaluation/Treatment: Yes Reason for Co-Treatment: For patient/therapist safety;To address functional/ADL transfers   OT goals addressed during session: ADL's and self-care      AM-PAC OT "6 Clicks" Daily Activity     Outcome Measure   Help from another person eating meals?: A  Little Help from another person taking care of personal grooming?: A Little Help from another person toileting, which includes using toliet, bedpan, or urinal?: A Lot Help from another person bathing (including washing, rinsing, drying)?: A Lot Help from another person to put on and taking off regular upper body clothing?: A Little Help from another person to  put on and taking off regular lower body clothing?: A Lot 6 Click Score: 15    End of Session Equipment Utilized During Treatment: Gait belt;Rolling walker (2 wheels);Oxygen  OT Visit Diagnosis: Unsteadiness on feet (R26.81);Other abnormalities of gait and mobility (R26.89);Muscle weakness (generalized) (M62.81)   Activity Tolerance Patient tolerated treatment well   Patient Left in chair;with call bell/phone within reach;with chair alarm set;with family/visitor present   Nurse Communication Mobility status        Time: 5929-2446 OT Time Calculation (min): 25 min  Charges: OT General Charges $OT Visit: 1 Visit OT Treatments $Self Care/Home Management : 8-22 mins  Malachy Chamber, OTR/L Acute Rehab Services Office: [phone]   Layla Maw 01/13/2022, 2:53 PM

## 2022-01-13 NOTE — Progress Notes (Signed)
Subjective:   Summary: [name] is a 76 y.o. year old male currently admitted on the [facility] HD#16 for septic shock secondary to acute cholecystitis and respiratory failure.  Overnight Events: - Reportedly tachypneic overnight, but oxygen saturation above 94% on 2 to 4 L nasal cannula.  He believes his respiratory status is improving and that he is able to breathe more easily. - Denies abdominal pain at this time and is having stools but endorses poor appetite - IDC score 3-4 overnight -He was able to move to a chair multiple times over the past couple days, looks forward to working with PT today.  Objective:  Vital signs in last 24 hours: Vitals:   01/13/22 0343 01/13/22 0400 01/13/22 0500 01/13/22 0600  BP:  126/63 125/74 129/78  Pulse:  87 79 85  Resp:  20 19 (!) 23  Temp: 97.6 F (36.4 C)     TempSrc: Oral     SpO2:  95% 93% 97%  Weight: 83.9 kg     Height:       Supplemental O2: Nasal Cannula SpO2: 97 % O2 Flow Rate (L/min): 4 L/min FiO2 (%): 40 %   Physical Exam:  Constitutional: Ill-appearing elderly man lying in bed in no acute distress.  Pleasant and responsive to questions. Cardiovascular: Nontachycardic, pulses irregular, no murmurs, rubs or gallops Pulmonary/Chest: Mildly increased work of breathing on 2 L nasal cannula, faint expiratory wheezing, lungs clear to auscultation bilaterally otherwise Abdominal: soft, non-tender, non-distended.  Percutaneous biliary drain with bilious drainage Skin: warm and dry   Filed Weights   01/11/22 0500 01/12/22 0500 01/13/22 0343  Weight: 87.7 kg 85.6 kg 83.9 kg     Intake/Output Summary (Last 24 hours) at 01/13/2022 0628 Last data filed at 01/13/2022 0600 Gross per 24 hour  Intake 260 ml  Output 3200 ml  Net -2940 ml   Net IO Since Admission: -40,598.49 mL [01/13/22 0628] Biliary drain output yesterday 125 cc of bilious fluid Pertinent Labs:    Latest Ref Rng & Units 01/13/2022   12:20  AM 01/12/2022    1:16 AM 01/11/2022    8:37 PM  CBC  WBC 4.0 - 10.5 K/uL 18.1  18.2  21.2   Hemoglobin 13.0 - 17.0 g/dL 10.3  9.9  9.8   Hematocrit 39.0 - 52.0 % 31.4  30.1  29.9   Platelets 150 - 400 K/uL 262  255  276        Latest Ref Rng & Units 01/13/2022   12:20 AM 01/12/2022    1:16 AM 01/11/2022    1:19 AM  CMP  Glucose 70 - 99 mg/dL 106  120  106   BUN 8 - 23 mg/dL '30  31  28   '$ Creatinine 0.61 - 1.24 mg/dL 1.22  1.34  1.19   Sodium 135 - 145 mmol/L 141  145  142   Potassium 3.5 - 5.1 mmol/L 3.6  3.2  3.5   Chloride 98 - 111 mmol/L 100  101  105   CO2 22 - 32 mmol/L '28  30  29   '$ Calcium 8.9 - 10.3 mg/dL 8.2  8.3  8.0   Total Protein 6.5 - 8.1 g/dL  6.1    Total Bilirubin 0.3 - 1.2 mg/dL  1.1    Alkaline Phos 38 - 126 U/L  46    AST 15 - 41 U/L  35  ALT 0 - 44 U/L  18     Mag 1.9  Imaging: No results found.  EKG:   Assessment/Plan:   Principal Problem:   Acute cholecystitis Active Problems:   Acute respiratory failure with hypoxia (HCC)   Encounter for biliary drainage tube placement   Hematuria   Extraperitoneal rupture of bladder   Leukocytosis   Patient Summary: [name] is a 76 y.o. male with hx of CAD, aortic stenosis, atrial fibrillation , who presented with septic shock secondary to acute cholecystitis and respiratory failure and admitted for septic shock secondary to acute cholecystitis and respiratory failure.  He was initially intubated with percutaneous biliary drain placed with subsequent bladder rupture requiring surgical repair on July 8.  Extubated on July 13 and transferred to [facility]TS on July 15.   #Perforated bladder status post surgical repair Gross hematuria Patient was noted to have worsening renal function with hematuria noted on 7/4 status post in and out cath.  Urology was consulted, recommended continuous bladder irrigation.  Found to have a ruptured bladder on CT imaging 7/7 and was taken to the OR for an exploratory laparotomy  and bladder repair. Pelvic drain was discontinued on 7/14.  Hemoglobin 10.3 this morning, continue to hold heparin. - Urology following, gave Mills Koller on July 15. Recommended continuing Foley, finasteride and to hold heparin.  Cystogram scheduled on July 22 - Closely monitor UOP --Trend CBC, transfuse for hgb < 8 due to CAD  #Acute hypoxic respiratory failure Mildly tachypneic overnight but saturating well on 4 L nasal cannula.  Regular aspirate showing rare Candida and staph. -Continue pulmonary hygiene -- Wean to 2 L nasal cannula today -SPO2 goal greater than 92%  #Leukocytosis, improving Sepsis secondary to acute cholecystitis s/p percutaneous drain placement, resolved Patient presented in septic shock requiring pressor support. He was too critically ill to tolerate surgery, so IR placed a percutaneous cholecystostomy tube was placed on 7/2 and patient completed 7 days of IV ceftriaxone. Patient weaned off pressor support on 7/7. Patient continues to have greater than 100 cc of biliary drain from his PERC drain. Leukocytosis slowly improving, patient remains afebrile. -IR is following, appreciate recs -Continue 3 times daily flushes with 5 cc normal saline -Follow drain output every shift -General surgery signed off 7/10, recommended outpatient follow-up -Trend fever curve and WBC  #Acute metabolic encephalopathy, resolved  Patient had ICU/septic delirium which improved. He was receiving Seroquel and Klonopin which was discontinued 7/14. Patient able to answer all orientation questions appropriately.  No further concerns for delirium at this moment. - Continue delirium precautions  Permanent A-fib on Coumadin Supra therapeutic INR, resolved   Left atrial appendage noted on CT scan though with likely artifact is no correlation on echocardiogram. On admission patient presented with a supra therapeutic INR at 7.3. This improved to 1.9 last checked on 7/4.  Heparin restarted but  subsequently discontinued due to worsening of gross hematuria.  Patient noted to be in A-fib but never in RVR and home Coreg 6.25 twice daily started yesterday. - Continue home Coreg 6.25 mg twice daily - Continue to hold anticoagulation  Hypertension CAD Moderate aortic valve stenosis Home medications include carvedilol, losartan furosemide and atorvastatin.  Patient restarted on home Coreg yesterday, also given Lasix 20 mg yesterday with net -3 L urine output.  Reduced crackles on respiratory exam today with oxygen requirement improving.  Potassium and creatinine within normal limits. - Coreg 6.25 mg BID as above - Repeat Lasix 20 mg today and  reassess --Continue atorvastatin 40 mg daily    AKI: Creatinine 1.22 today, which is within his baseline range. Continue to monitor closely with diuresis.  Trend BMP. Hypokalemia (resolved): K+ 3.6 this morning Hyperlipidemia: Continue home atorvastatin 40 mg daily Gout: Holding home allopurinol for now.  Consider restarting on discharge but might need to cover for acute flare.  Diet: Normal IVF: None,None VTE: Enoxaparin Code:  Partial , declined CPR and defibrillation, but CODE BLUE, ACLS meds, intubation, BiPAP acceptable PT/OT recs: Pending PT reevaluation for CIR TOC recs:  Family Update: Wife in the room while we spoke to patient.  Answered all questions   Dispo: Anticipated discharge pending PT reevaluation Linus Galas, MD PGY-1 Internal Medicine Resident Please contact the on call pager after 5 pm and on weekends at [phone].

## 2022-01-13 NOTE — Progress Notes (Signed)
Inpatient Rehabilitation Admissions Coordinator   I will place order for rehab consult for full assessment of candidacy.  Danne Baxter, RN, MSN Rehab Admissions Coordinator [phone] 01/13/2022 8:43 PM

## 2022-01-13 NOTE — Progress Notes (Signed)
9 Days Post-Op   Subjective/Chief Complaint:  1 - Bladder Rupture - s/p open repair of bladder rupture 01/03/22 by Alinda Money. JP Cr 1.4 7/13 (same as serum). JP removed 7/15. Urine clear yellow today  2 - Gross Hematuria - gross hematuria from presuemed BPH in setting of blood thinners durign prolonged hospitalization orignally form septic cholecystitis. His indication is just AFib. Firmagon 240 given 7/15.   No acute events overnight. Hemoglobin remains stable. Urine clear draining well. Midline incision with some mild erythema but overall well appearing   Objective: Vital signs in last 24 hours: Temp:  [97.6 F (36.4 C)-98.6 F (37 C)] 97.8 F (36.6 C) (07/17 0713) Pulse Rate:  [79-102] 86 (07/17 0700) Resp:  [16-28] 21 (07/17 0700) BP: (107-147)/(51-78) 131/70 (07/17 0700) SpO2:  [92 %-99 %] 95 % (07/17 0700) Weight:  [83.9 kg] 83.9 kg (07/17 0343) Last BM Date : 01/12/22  Intake/Output from previous day: 07/16 0701 - 07/17 0700 In: 260 [P.O.:260] Out: 2050 [Urine:2050] Intake/Output this shift: No intake/output data recorded.  Stigmata of chonic disease, wife at bedse. Pt is obese but frail. Some use of accessory muscles with breathing Obese abdomen, midline incision with staples. C/d/I, mild erythema demarcated, well within drawn borders Chole drain I place with yellowish non-foul fluid.  Foley with clear urine, no clots, thin.   Lab Results:  Recent Labs    01/12/22 0116 01/13/22 0020  WBC 18.2* 18.1*  HGB 9.9* 10.3*  HCT 30.1* 31.4*  PLT 255 262    BMET Recent Labs    01/12/22 0116 01/13/22 0020  NA 145 141  K 3.2* 3.6  CL 101 100  CO2 30 28  GLUCOSE 120* 106*  BUN 31* 30*  CREATININE 1.34* 1.22  CALCIUM 8.3* 8.2*    PT/INR No results for input(s): "LABPROT", "INR" in the last 72 hours. ABG No results for input(s): "PHART", "HCO3" in the last 72 hours.  Invalid input(s): "PCO2", "PO2"  Studies/Results: No results  found.  Anti-infectives: Anti-infectives (From admission, onward)    Start     Dose/Rate Route Frequency Ordered Stop   01/04/22 0900  cefTRIAXone (ROCEPHIN) 1 g in sodium chloride 0.9 % 100 mL IVPB        1 g 200 mL/hr over 30 Minutes Intravenous Every 24 hours 01/04/22 0807 01/11/22 1930   01/01/22 1400  cefTRIAXone (ROCEPHIN) 2 g in sodium chloride 0.9 % 100 mL IVPB  Status:  Discontinued        2 g 200 mL/hr over 30 Minutes Intravenous Every 24 hours 01/01/22 0842 01/03/22 0755   12/28/21 1600  piperacillin-tazobactam (ZOSYN) IVPB 3.375 g  Status:  Discontinued        3.375 g 12.5 mL/hr over 240 Minutes Intravenous Every 8 hours 12/28/21 1419 01/01/22 0842   12/28/21 1600  cefOXitin (MEFOXIN) 2 g in sodium chloride 0.9 % 100 mL IVPB  Status:  Discontinued        2 g 200 mL/hr over 30 Minutes Intravenous To Radiology 12/28/21 1509 12/29/21 1437   12/28/21 1256  vancomycin variable dose per unstable renal function (pharmacist dosing)  Status:  Discontinued         Does not apply See admin instructions 12/28/21 1256 12/28/21 1419   12/28/21 1015  piperacillin-tazobactam (ZOSYN) IVPB 3.375 g        3.375 g 100 mL/hr over 30 Minutes Intravenous  Once 12/28/21 1001 12/28/21 1029   12/28/21 1015  vancomycin (VANCOREADY) IVPB 1750 mg/350 mL  1,750 mg 175 mL/hr over 120 Minutes Intravenous  Once 12/28/21 1003 12/28/21 1254       Assessment/Plan:  1 - Bladder Rupture - now s/p repair. Continue foley. Cystogram for ~2weeks after repair (around 7/22). Staples to be removed at same time.  2 - Gross Hematuria - hemoglobin stable. Continue foley catheter and H&H monitoring   Greatly appreciate IM team comanagment.    Florentina Addison 01/13/2022

## 2022-01-13 NOTE — Progress Notes (Signed)
Physical Therapy Treatment Patient Details Name: [name] MRN: [id number] DOB: 19-Sep-1945 Today's Date: 01/13/2022   History of Present Illness 76 yo admitted 7/1 with malaise, Abd pain and cholecystitis. 7/2 IR placed perc chole drain with intubation same date and sepsis. 7/8 pt with extraperitoneal bladder rupture to OR for ex lap, cystotomy with bladder repair. PMHx: HLD, CAD, AS, HTN, gout, Afib    PT Comments    Pt admitted with above diagnosis. Pt progressing slowly.  Was able to ambulate today with RW.  Would benefit from AIR as pt with decr balance and endurance. Wife supportive.  Pt currently with functional limitations due to balance and endurance deficits. Pt will benefit from skilled PT to increase their independence and safety with mobility to allow discharge to the venue listed below.      Recommendations for follow up therapy are one component of a multi-disciplinary discharge planning process, led by the attending physician.  Recommendations may be updated based on patient status, additional functional criteria and insurance authorization.  Follow Up Recommendations  Acute inpatient rehab (3hours/day)     Assistance Recommended at Discharge Frequent or constant Supervision/Assistance  Patient can return home with the following Two people to help with walking and/or transfers;Two people to help with bathing/dressing/bathroom;Direct supervision/assist for medications management;Assistance with feeding;Assist for transportation;Help with stairs or ramp for entrance;Direct supervision/assist for financial management;Assistance with cooking/housework   Equipment Recommendations  Other (comment) (TBD with progression)    Recommendations for Other Services       Precautions / Restrictions Precautions Precautions: Fall;Other (comment) Precaution Comments: lap chole drain,  penile wounds Restrictions Weight Bearing Restrictions: No     Mobility  Bed Mobility Overal  bed mobility: Needs Assistance Bed Mobility: Rolling, Sit to Supine Rolling: Mod assist   Supine to sit: HOB elevated, Mod assist     General bed mobility comments: Pt needed mod assist to come to EOB needing assist to initiate LEs to EOB and for elevation of trunk.    Transfers Overall transfer level: Needs assistance Equipment used: Rolling walker (2 wheels) Transfers: Sit to/from Stand Sit to Stand: Min assist           General transfer comment: Steadying assist and cues for hand placement from bedside with RW.  Slow to rise.    Ambulation/Gait Ambulation/Gait assistance: Min assist, +2 safety/equipment Gait Distance (Feet): 65 Feet Assistive device: Rolling walker (2 wheels) Gait Pattern/deviations: Step-through pattern, Decreased stride length, Trunk flexed, Wide base of support, Drifts right/left   Gait velocity interpretation: <1.31 ft/sec, indicative of household ambulator   General Gait Details: Pt needed min assist at times for ambulation as he is unsteady at tiems, needs assist to steer RW and for sequencing steps and RW. Pt on 2LO2 with ambulation as well and sats appeared to maintain >88%.  Pt ambulated to sink and pt washed face and hands.  Posterior lean needing assist for static stance.   Stairs             Wheelchair Mobility    Modified Rankin (Stroke Patients Only)       Balance Overall balance assessment: Needs assistance Sitting-balance support: Bilateral upper extremity supported, Feet supported, No upper extremity supported Sitting balance-Leahy Scale: Fair Sitting balance - Comments: min-mod A with BUE supporting, quick to fatigue Postural control: Posterior lean Standing balance support: Bilateral upper extremity supported Standing balance-Leahy Scale: Poor Standing balance comment: reliant on UE support in standing, posterior sway without external support while wahsing hands  and face at sink.                             Cognition Arousal/Alertness: Awake/alert Behavior During Therapy: WFL for tasks assessed/performed Overall Cognitive Status: Impaired/Different from baseline Area of Impairment: Attention, Memory, Following commands, Safety/judgement, Problem solving, Awareness                   Current Attention Level: Sustained Memory: Decreased short-term memory Following Commands: Follows one step commands consistently, Follows one step commands with increased time Safety/Judgement: Decreased awareness of safety, Decreased awareness of deficits Awareness: Intellectual Problem Solving: Slow processing, Requires verbal cues, Requires tactile cues General Comments: pleasant, participatory, some decreased insight into (balance) deficits. follows commands consistently but also benefits from safety cues for DME use        Exercises General Exercises - Lower Extremity Ankle Circles/Pumps: AROM, Both, 10 reps, Seated Long Arc Quad: AROM, Both, 10 reps, Seated    General Comments General comments (skin integrity, edema, etc.): Wife present and supportive. still hoping for rehab to further progress as pt at increased risk for falls      Pertinent Vitals/Pain Pain Assessment Pain Assessment: Faces Faces Pain Scale: Hurts little more Pain Location: abdomen Pain Descriptors / Indicators: Sore Pain Intervention(s): Limited activity within patient's tolerance, Monitored during session, Repositioned    Home Living                          Prior Function            PT Goals (current goals can now be found in the care plan section) Acute Rehab PT Goals Patient Stated Goal: be able to return home Progress towards PT goals: Progressing toward goals    Frequency    Min 3X/week      PT Plan Current plan remains appropriate    Co-evaluation PT/OT/SLP Co-Evaluation/Treatment: Yes Reason for Co-Treatment: Complexity of the patient's impairments (multi-system involvement);For  patient/therapist safety PT goals addressed during session: Mobility/safety with mobility OT goals addressed during session: ADL's and self-care      AM-PAC PT "6 Clicks" Mobility   Outcome Measure  Help needed turning from your back to your side while in a flat bed without using bedrails?: A Lot Help needed moving from lying on your back to sitting on the side of a flat bed without using bedrails?: A Lot Help needed moving to and from a bed to a chair (including a wheelchair)?: A Lot Help needed standing up from a chair using your arms (e.g., wheelchair or bedside chair)?: A Little Help needed to walk in hospital room?: Total Help needed climbing 3-5 steps with a railing? : Total 6 Click Score: 11    End of Session Equipment Utilized During Treatment: Gait belt;Oxygen Activity Tolerance: Patient limited by fatigue Patient left: with family/visitor present;in chair;with call bell/phone within reach;with chair alarm set Nurse Communication: Mobility status PT Visit Diagnosis: Difficulty in walking, not elsewhere classified (R26.2);Muscle weakness (generalized) (M62.81);Other abnormalities of gait and mobility (R26.89)     Time: 2979-8921 PT Time Calculation (min) (ACUTE ONLY): 28 min  Charges:  $Gait Training: 8-22 mins                     Ambriana Selway M,PT Acute Rehab Services [place] 01/13/2022, 3:33 PM

## 2022-01-13 NOTE — Plan of Care (Signed)
  Problem: Education: Goal: Knowledge of General Education information will improve Description: Including pain rating scale, medication(s)/side effects and non-pharmacologic comfort measures Outcome: Progressing   Problem: Health Behavior/Discharge Planning: Goal: Ability to manage health-related needs will improve Outcome: Progressing   Problem: Clinical Measurements: Goal: Ability to maintain clinical measurements within normal limits will improve Outcome: Progressing Goal: Will remain free from infection Outcome: Progressing Goal: Diagnostic test results will improve Outcome: Progressing Goal: Respiratory complications will improve Outcome: Progressing Goal: Cardiovascular complication will be avoided Outcome: Progressing   Problem: Activity: Goal: Risk for activity intolerance will decrease Outcome: Progressing   Problem: Nutrition: Goal: Adequate nutrition will be maintained Outcome: Progressing   Problem: Coping: Goal: Level of anxiety will decrease Outcome: Progressing   Problem: Elimination: Goal: Will not experience complications related to bowel motility Outcome: Progressing Goal: Will not experience complications related to urinary retention Outcome: Progressing   Problem: Pain Managment: Goal: General experience of comfort will improve Outcome: Progressing   Problem: Safety: Goal: Ability to remain free from injury will improve Outcome: Progressing   Problem: Skin Integrity: Goal: Risk for impaired skin integrity will decrease Outcome: Progressing   Problem: Education: Goal: Ability to describe self-care measures that may prevent or decrease complications (Diabetes Survival Skills Education) will improve Outcome: Progressing Goal: Individualized Educational Video(s) Outcome: Progressing   Problem: Coping: Goal: Ability to adjust to condition or change in health will improve Outcome: Progressing   Problem: Fluid Volume: Goal: Ability to  maintain a balanced intake and output will improve Outcome: Progressing   Problem: Health Behavior/Discharge Planning: Goal: Ability to identify and utilize available resources and services will improve Outcome: Progressing Goal: Ability to manage health-related needs will improve Outcome: Progressing   Problem: Metabolic: Goal: Ability to maintain appropriate glucose levels will improve Outcome: Progressing   Problem: Nutritional: Goal: Maintenance of adequate nutrition will improve Outcome: Progressing Goal: Progress toward achieving an optimal weight will improve Outcome: Progressing   Problem: Skin Integrity: Goal: Risk for impaired skin integrity will decrease Outcome: Progressing   Problem: Tissue Perfusion: Goal: Adequacy of tissue perfusion will improve Outcome: Progressing   Problem: Safety: Goal: Non-violent Restraint(s) Outcome: Progressing   Problem: Education: Goal: Knowledge of General Education information will improve Description: Including pain rating scale, medication(s)/side effects and non-pharmacologic comfort measures Outcome: Progressing   Problem: Health Behavior/Discharge Planning: Goal: Ability to manage health-related needs will improve Outcome: Progressing   Problem: Clinical Measurements: Goal: Ability to maintain clinical measurements within normal limits will improve Outcome: Progressing Goal: Will remain free from infection Outcome: Progressing Goal: Diagnostic test results will improve Outcome: Progressing Goal: Respiratory complications will improve Outcome: Progressing Goal: Cardiovascular complication will be avoided Outcome: Progressing   Problem: Activity: Goal: Risk for activity intolerance will decrease Outcome: Progressing   Problem: Nutrition: Goal: Adequate nutrition will be maintained Outcome: Progressing   Problem: Coping: Goal: Level of anxiety will decrease Outcome: Progressing   Problem:  Elimination: Goal: Will not experience complications related to bowel motility Outcome: Progressing Goal: Will not experience complications related to urinary retention Outcome: Progressing   Problem: Pain Managment: Goal: General experience of comfort will improve Outcome: Progressing   Problem: Safety: Goal: Ability to remain free from injury will improve Outcome: Progressing   Problem: Skin Integrity: Goal: Risk for impaired skin integrity will decrease Outcome: Progressing

## 2022-01-14 DIAGNOSIS — K810 Acute cholecystitis: Secondary | ICD-10-CM | POA: Diagnosis not present

## 2022-01-14 LAB — BASIC METABOLIC PANEL
Anion gap: 11 (ref 5–15)
BUN: 26 mg/dL — ABNORMAL HIGH (ref 8–23)
CO2: 29 mmol/L (ref 22–32)
Calcium: 8.3 mg/dL — ABNORMAL LOW (ref 8.9–10.3)
Chloride: 102 mmol/L (ref 98–111)
Creatinine, Ser: 1.15 mg/dL (ref 0.61–1.24)
GFR, Estimated: >60 mL/min (ref >60)
Glucose, Bld: 103 mg/dL — ABNORMAL HIGH (ref 70–99)
Potassium: 3.4 mmol/L — ABNORMAL LOW (ref 3.5–5.1)
Sodium: 142 mmol/L (ref 135–145)

## 2022-01-14 LAB — CBC
HCT: 32.8 % — ABNORMAL LOW (ref 39.0–52.0)
Hemoglobin: 10.7 g/dL — ABNORMAL LOW (ref 13.0–17.0)
MCH: 31.3 pg (ref 26.0–34.0)
MCHC: 32.6 g/dL (ref 30.0–36.0)
MCV: 95.9 fL (ref 80.0–100.0)
Platelets: 281 10*3/uL (ref 150–400)
RBC: 3.42 MIL/uL — ABNORMAL LOW (ref 4.22–5.81)
RDW: 14.9 % (ref 11.5–15.5)
WBC: 16.8 10*3/uL — ABNORMAL HIGH (ref 4.0–10.5)
nRBC: 0 % (ref 0.0–0.2)

## 2022-01-14 LAB — MAGNESIUM: Magnesium: 1.9 mg/dL (ref 1.7–2.4)

## 2022-01-14 MED ORDER — POTASSIUM CHLORIDE CRYS ER 20 MEQ PO TBCR
40.0000 meq | EXTENDED_RELEASE_TABLET | Freq: Once | ORAL | Status: AC
Start: 2022-01-14 — End: 2022-01-14
  Administered 2022-01-14: 40 meq via ORAL
  Filled 2022-01-14: qty 2

## 2022-01-14 NOTE — Progress Notes (Signed)
This RN attempted to call report to RN taking this patient, RN not available for report at this time.

## 2022-01-14 NOTE — Progress Notes (Signed)
    Inpatient Rehabilitation Admissions Coordinator   Met with patient and wife at bedside for rehab assessment. We discussed goals and expectations of a possible CIR admit. They prefer CIR for rehab. Family can provide expected caregiver support that is recommended. I will begin insurance Auth with [facility] Medicare for possible CIR admit pending approval. Please call me with any questions.   Danne Baxter, RN, MSN Rehab Admissions Coordinator [phone]

## 2022-01-14 NOTE — Progress Notes (Addendum)
Subjective:   Summary: [name] is a 76 y.o. year old male currently admitted on the IMTS HD#17 for septic shock secondary to acute cholecystitis and respiratory failure.  Overnight Events: - Denies abdominal pain at this time and and appetite is improving.  Patient is having bowel movements. - PT/OT evaluated him yesterday and recommended CIR  Objective:  Vital signs in last 24 hours: Vitals:   01/14/22 0356 01/14/22 0400 01/14/22 0500 01/14/22 0600  BP:  (!) 138/48 129/67 136/63  Pulse:  83 87 87  Resp:  (!) 22 (!) 24 17  Temp: 98 F (36.7 C)     TempSrc: Axillary     SpO2:  100% 100% 100%  Weight:      Height:       Supplemental O2: Nasal Cannula O2 sat 96 to 100% overnight on 2 L nasal cannula, continue to saturate at 100% after weaning to 1 L Maps 70s to 80s overnight  Physical Exam:  Constitutional: Chronically ill-appearing elderly man lying in bed in no acute distress.  Pleasant and responsive to questions. Cardiovascular: Regular rate and rhythm, no murmurs, rubs or gallops Pulmonary/Chest: Normal work of breathing on 1 L nasal cannula,  lungs clear to auscultation bilaterally Abdominal: soft, non-tender, non-distended.  Percutaneous biliary drain with minimal bilious drainage.  Urinary catheter draining amber-colored urine. Skin: Cold and dry   Filed Weights   01/12/22 0500 01/13/22 0343 01/14/22 0355  Weight: 85.6 kg 83.9 kg 82.1 kg     Intake/Output Summary (Last 24 hours) at 01/14/2022 0646 Last data filed at 01/14/2022 0600 Gross per 24 hour  Intake 340 ml  Output 1450 ml  Net -1110 ml    Net IO Since Admission: -41,708.49 mL [01/14/22 0646]  Biliary drain output total output 225 cc between 7 AM on July 17 and 7 AM on July 18  Pertinent Labs:    Latest Ref Rng & Units 01/14/2022    1:52 AM 01/13/2022   12:20 AM 01/12/2022    1:16 AM  CBC  WBC 4.0 - 10.5 K/uL 16.8  18.1  18.2   Hemoglobin 13.0 - 17.0 g/dL 10.7  10.3   9.9   Hematocrit 39.0 - 52.0 % 32.8  31.4  30.1   Platelets 150 - 400 K/uL 281  262  255        Latest Ref Rng & Units 01/14/2022    1:52 AM 01/13/2022   12:20 AM 01/12/2022    1:16 AM  CMP  Glucose 70 - 99 mg/dL 103  106  120   BUN 8 - 23 mg/dL '26  30  31   '$ Creatinine 0.61 - 1.24 mg/dL 1.15  1.22  1.34   Sodium 135 - 145 mmol/L 142  141  145   Potassium 3.5 - 5.1 mmol/L 3.4  3.6  3.2   Chloride 98 - 111 mmol/L 102  100  101   CO2 22 - 32 mmol/L '29  28  30   '$ Calcium 8.9 - 10.3 mg/dL 8.3  8.2  8.3   Total Protein 6.5 - 8.1 g/dL   6.1   Total Bilirubin 0.3 - 1.2 mg/dL   1.1   Alkaline Phos 38 - 126 U/L   46   AST 15 - 41 U/L   35   ALT 0 - 44 U/L   18    Mag 1.9  Imaging:  Assessment/Plan:   Principal Problem:   Acute cholecystitis Active Problems:   Acute respiratory failure with hypoxia (HCC)   Encounter for biliary drainage tube placement   Hematuria   Extraperitoneal rupture of bladder   Leukocytosis   Patient Summary: [name] is a 76 y.o. male with hx of CAD, aortic stenosis, atrial fibrillation who presented with nausea, vomiting, abdominal pain, dyspnea and admitted for septic shock secondary to acute cholecystitis with associated respiratory failure.  He was initially intubated with percutaneous biliary drain placed with subsequent bladder rupture requiring surgical repair on July 8.  Extubated on July 13 and transferred to MTS on July 15.  Symptoms and leukocytosis currently resolving with decreasing oxygen requirement, evaluated to transition to CIR.   #Perforated bladder status post surgical repair #Gross hematuria Patient was noted to have worsening renal function with hematuria noted on 7/4 status post in and out cath. Found to have a ruptured bladder on CT imaging 7/7 and was taken to the OR for an exploratory laparotomy and bladder repair. Pelvic drain was discontinued on 7/14.  Hemoglobin 10.7 this morning, will continue to hold heparin. -  Urology following, gave Mills Koller on July 15. Recommended continuing Foley, finasteride and to hold heparin.  Cystogram scheduled on July 22 - Closely monitor UOP --Trend CBC  #Acute hypoxic respiratory failure Mildly tachypneic overnight but saturating well on 1-2 L nasal cannula.  Patient denies dyspnea and feels like he is improving.  Of note, no crackles today on respiratory exam.  If patient has worsening respiratory status and appears volume overloaded, will consider resuming diuresis in the future. -Continue pulmonary hygiene --Wean off supplemental oxygen as appropriate -SPO2 goal greater than 92%  #Sepsis secondary to acute cholecystitis s/p percutaneous drain placement, resolved Patient presented in septic shock requiring pressor support. He was too critically ill to tolerate surgery, so IR placed a percutaneous cholecystostomy tube was placed on 7/2 and patient completed 7 days of IV ceftriaxone. Patient weaned off pressor support on 7/7. Leukocytosis slowly improving at 16.8 today, patient remains afebrile.  Perc drain output decreasing, IR note this morning recommends PERC drain to remain in place until IR is able to investigate patency, with possible removal during subsequent surgery, or capping drain for trial after 6 weeks. -IR is following, recommendations as above, appreciate recs -Continue 3 times daily flushes with 5 cc normal saline -Follow drain output every shift -General surgery signed off 7/10, recommended outpatient follow-up -Trend fever curve and WBC  #Acute metabolic encephalopathy, resolved  Patient had ICU/septic delirium which improved. He was receiving Seroquel and Klonopin which was discontinued 7/14. Patient able to answer all orientation questions appropriately.  No further concerns for delirium at this moment.  Permanent Valvular A-fib on Coumadin Supra therapeutic INR, resolved   Left atrial appendage noted on CT scan though with likely artifact is no  correlation on echocardiogram. On admission patient presented with a supra therapeutic INR at 7.3. This improved to 1.9 last checked on 7/4.  Heparin restarted but subsequently discontinued due to worsening of gross hematuria.  Patient noted to be in A-fib but never in RVR and home Coreg 6.25 twice daily started yesterday. - Continue home Coreg 6.25 mg twice daily - Continue to hold anticoagulation  Hypertension CAD Moderate aortic valve stenosis Home medications include carvedilol, losartan furosemide and atorvastatin.  Blood pressures adequately controlled on home Coreg, holding home losartan for now. - Coreg 6.25 mg BID as above --Continue atorvastatin 40 mg daily   AKI:  Creatinine 1.15 today, which is within his baseline range.  Volume status today reassuring, no diuresis planned. Trend BMP. Hypokalemia: Potassium 3.4 this morning, repleted with 40 mEq Hyperlipidemia: Continue home atorvastatin 40 mg daily Gout: Holding home allopurinol for now.  Consider restarting on discharge but might need to cover for acute flare.  Diet: Normal IVF: None,None VTE: Enoxaparin Code:  Partial , declined CPR and defibrillation, but CODE BLUE, ACLS meds, intubation, BiPAP acceptable PT/OT recs: PT and OT recommended CIR, working on [employer] placement TOC recs:  Family Update: Wife in the room while we spoke to patient.  Answered all questions  Dispo: Anticipated discharge pending PT reevaluation Linus Galas, MD PGY-1 Internal Medicine Resident Please contact the on call pager after 5 pm and on weekends at [phone].

## 2022-01-14 NOTE — Progress Notes (Signed)
Referring Physician(s): Reather Laurence   Supervising Physician: Michaelle Birks  Patient Status:  [facility] - In-pt  Chief Complaint:  Perc Chole follow up  Brief History:  [name] is a 76 yo male with acute cholecystitis s/p perc chole placement on 7/2 by Dr. Laurence Ferrari    He was found to have a ruptured urinary bladder, s/p repair by urology on 7/8.   Subjective:  Looks much better today. States he feels better and they are moving him to another room.  Allergies: Patient has no known allergies.  Medications: Prior to Admission medications   Medication Sig Start Date End Date Taking? Authorizing Provider  allopurinol (ZYLOPRIM) 100 MG tablet TAKE 2 TABLETS EVERY DAY (DOSE INCREASE) Patient taking differently: Take 200 mg by mouth daily. 05/27/21  Yes Axel Filler, MD  atorvastatin (LIPITOR) 40 MG tablet TAKE 1 TABLET EVERY DAY Patient taking differently: Take 40 mg by mouth daily. 08/14/21  Yes Axel Filler, MD  carvedilol (COREG) 6.25 MG tablet TAKE 1 TABLET TWICE DAILY WITH MEALS (NEED MD APPOINTMENT) Patient taking differently: Take 6.25 mg by mouth 2 (two) times daily with a meal. 11/19/21  Yes Crenshaw, Denice Bors, MD  furosemide (LASIX) 20 MG tablet TAKE 1 TABLET EVERY DAY Patient taking differently: Take 20 mg by mouth daily. 11/19/21  Yes Lelon Perla, MD  losartan (COZAAR) 50 MG tablet TAKE 1 TABLET EVERY DAY Patient taking differently: Take 50 mg by mouth daily. 03/20/21  Yes Lelon Perla, MD  naproxen sodium (ALEVE) 220 MG tablet Take 440 mg by mouth daily as needed (pain).   Yes [provider]  warfarin (COUMADIN) 5 MG tablet Take 1-2 tablets by mouth daily or as directed by Coumadin clinic Patient taking differently: Take 2.5-5 mg by mouth daily. 2.5 mg daily on Sunday, Tuesday, Thursday, Saturday 5 mg daily on Monday, Wednesday, Friday 11/19/21  Yes Crenshaw, Denice Bors, MD     Vital Signs: BP (!) 103/59   Pulse 100   Temp 98  F (36.7 C) (Axillary)   Resp 18   Ht '5\' 6"'$  (1.676 m)   Wt 181 lb (82.1 kg)   SpO2 98%   BMI 29.21 kg/m   Physical Exam Constitutional:      Appearance: Normal appearance.  HENT:     Head: Normocephalic and atraumatic.  Cardiovascular:     Rate and Rhythm: Normal rate.  Pulmonary:     Effort: Pulmonary effort is normal. No respiratory distress.  Abdominal:     Palpations: Abdomen is soft.  Skin:    General: Skin is warm and dry.  Neurological:     General: No focal deficit present.     Mental Status: He is alert and oriented to person, place, and time.  Psychiatric:        Mood and Affect: Mood normal.        Behavior: Behavior normal.        Thought Content: Thought content normal.        Judgment: Judgment normal.    Drain Location: RUQ Size: Fr size: 10 Fr Date of placement: 12/29/21  Currently to: Drain collection device: gravity 24 hour output:  Output by Drain (mL) 01/12/22 0701 - 01/12/22 1900 01/12/22 1901 - 01/13/22 0700 01/13/22 0701 - 01/13/22 1900 01/13/22 1901 - 01/14/22 0700 01/14/22 0701 - 01/14/22 1354  Biliary Tube Cook slip-coat 10.2 Fr. RUQ   125 100     Current examination: Flushes/aspirates easily.  Bilious output  Insertion site unremarkable. Suture and stat lock in place. Dressed appropriately.    Imaging: No results found.  Labs:  CBC: Recent Labs    01/11/22 2037 01/12/22 0116 01/13/22 0020 01/14/22 0152  WBC 21.2* 18.2* 18.1* 16.8*  HGB 9.8* 9.9* 10.3* 10.7*  HCT 29.9* 30.1* 31.4* 32.8*  PLT 276 255 262 281    COAGS: Recent Labs    12/28/21 0943 12/28/21 1638 12/29/21 0054 12/31/21 0342  INR 7.3* 2.8* 1.7* 1.9*    BMP: Recent Labs    01/11/22 0119 01/12/22 0116 01/13/22 0020 01/14/22 0152  NA 142 145 141 142  K 3.5 3.2* 3.6 3.4*  CL 105 101 100 102  CO2 '29 30 28 29  '$ GLUCOSE 106* 120* 106* 103*  BUN 28* 31* 30* 26*  CALCIUM 8.0* 8.3* 8.2* 8.3*  CREATININE 1.19 1.34* 1.22 1.15  GFRNONAA >60 56* >60 >60     LIVER FUNCTION TESTS: Recent Labs    12/28/21 0943 01/02/22 0431 01/06/22 0920 01/12/22 0116  BILITOT 2.3* 1.0 1.2 1.1  AST '28 24 21 '$ 35  ALT '13 11 14 18  '$ ALKPHOS 67 43 46 46  PROT 7.0 6.2* 5.7* 6.1*  ALBUMIN 3.0* 2.3* 2.3* 2.3*    Assessment and Plan:  Acute cholecystitis = S/p Perc chole 12/29/21 by Dr. Laurence Ferrari.  Percutaneous cholecystostomy drain to remain in place at least 6 weeks.   Recommend fluoroscopy with injection of the drain in IR to evaluate for patency of the cystic duct.  If the duct is patent and general surgery feels patient is stable for cholecystectomy, the drain would be removed at time of surgery.  If the duct is patent and general surgery feels patient is NEVER a candidate for cholecystectomy, drain can be capped for a trial.  If symptoms recur, then place to gravity bag again.  If trial is successful, discuss possible removal of the drain.  Please call the IR PA at [phone] when patient is about to be discharged and we will arrange the follow up drain injection (ok to leave message).   Discharge planning: Please contact IR APP or on call IR MD prior to patient d/c to ensure appropriate follow up plans are in place. Typically patient will follow up with IR clinic 10-14 days post d/c for repeat imaging/possible drain injection. IR scheduler will contact patient with date/time of appointment. Patient will need to flush drain QD with 5 cc NS, record output QD, dressing changes every 2-3 days or earlier if soiled.   IR will continue to follow - please call with questions or concerns.   Electronically Signed: Murrell Redden, PA-C 01/14/2022, 1:46 PM    I spent a total of 15 Minutes at the the patient's bedside AND on the patient's hospital floor or unit, greater than 50% of which was counseling/coordinating care for perc chole follow up.

## 2022-01-15 DIAGNOSIS — K810 Acute cholecystitis: Secondary | ICD-10-CM | POA: Diagnosis not present

## 2022-01-15 DIAGNOSIS — L8990 Pressure ulcer of unspecified site, unspecified stage: Secondary | ICD-10-CM | POA: Insufficient documentation

## 2022-01-15 LAB — BASIC METABOLIC PANEL
Anion gap: 12 (ref 5–15)
BUN: 22 mg/dL (ref 8–23)
CO2: 27 mmol/L (ref 22–32)
Calcium: 8 mg/dL — ABNORMAL LOW (ref 8.9–10.3)
Chloride: 99 mmol/L (ref 98–111)
Creatinine, Ser: 1.09 mg/dL (ref 0.61–1.24)
GFR, Estimated: >60 mL/min (ref >60)
Glucose, Bld: 108 mg/dL — ABNORMAL HIGH (ref 70–99)
Potassium: 3.4 mmol/L — ABNORMAL LOW (ref 3.5–5.1)
Sodium: 138 mmol/L (ref 135–145)

## 2022-01-15 LAB — CBC
HCT: 30.7 % — ABNORMAL LOW (ref 39.0–52.0)
Hemoglobin: 10 g/dL — ABNORMAL LOW (ref 13.0–17.0)
MCH: 30.8 pg (ref 26.0–34.0)
MCHC: 32.6 g/dL (ref 30.0–36.0)
MCV: 94.5 fL (ref 80.0–100.0)
Platelets: 306 10*3/uL (ref 150–400)
RBC: 3.25 MIL/uL — ABNORMAL LOW (ref 4.22–5.81)
RDW: 14.6 % (ref 11.5–15.5)
WBC: 16.1 10*3/uL — ABNORMAL HIGH (ref 4.0–10.5)
nRBC: 0 % (ref 0.0–0.2)

## 2022-01-15 MED ORDER — POTASSIUM CHLORIDE CRYS ER 20 MEQ PO TBCR
40.0000 meq | EXTENDED_RELEASE_TABLET | Freq: Once | ORAL | Status: AC
Start: 2022-01-15 — End: 2022-01-15
  Administered 2022-01-15: 40 meq via ORAL
  Filled 2022-01-15: qty 2

## 2022-01-15 MED ORDER — ALBUTEROL SULFATE (2.5 MG/3ML) 0.083% IN NEBU: 2.5000 mg | INHALATION_SOLUTION | Freq: Four times a day (QID) | RESPIRATORY_TRACT | Status: DC | PRN

## 2022-01-15 MED ORDER — WARFARIN - PHARMACIST DOSING INPATIENT: Freq: Every day | Status: DC

## 2022-01-15 MED ORDER — WARFARIN SODIUM 5 MG PO TABS
5.0000 mg | ORAL_TABLET | Freq: Once | ORAL | Status: AC
  Administered 2022-01-15: 5 mg via ORAL
  Filled 2022-01-15: qty 1

## 2022-01-15 NOTE — Progress Notes (Signed)
SATURATION QUALIFICATIONS: (This note is used to comply with regulatory documentation for home oxygen)  Patient Saturations on Room Air at Rest = 91%  Patient Saturations on Room Air while Ambulating = 80%  Patient Saturations on 4 Liters of oxygen while Ambulating = 88%  Please briefly explain why patient needs home oxygen:Pt currently needing O2 with activity to keep sats >88%.   Jaxsun Ciampi M,PT Acute Rehab Services [phone]

## 2022-01-15 NOTE — Progress Notes (Signed)
Speech Language Pathology Treatment: Dysphagia  Patient Details Name: [name] MRN: [id number] DOB: [birth year] Today's Date: 01/15/2022 Time: 1995-7900 SLP Time Calculation (min) (ACUTE ONLY): 15 min  Assessment / Plan / Recommendation Clinical Impression  Patient seen by SLP for skilled treatment focused on dysphagia goals. He was awake and alert when SLP entered the room. His voice was clear and strong and he reported that it has been that way as long as he drinks water throughout the day. SLP observed patient with straw sips of thin liquids (water) and although he did exhibit a mild delayed cough, this was infrequent and not consistent. No change in vitals or voice after water sips. Patient stated that his appetite has been good and he has not had any difficulty with swallowing at meal times. SLP did observe that patient had instances of confusion thinking he had met this SLP before and asking SLP "did you go to that big meeting at church?" Overall, patient appears to be improving with his post-extubation dysphagia and vocal impairment. SLP will f/u at least one more time to ensure diet toleration.   HPI HPI: 76 yo admitted 7/1 with malaise, Abd pain and cholecystitis. 7/2 IR placed perc chole drain with intubation same date and sepsis. 7/8 pt with extraperitoneal bladder rupture to OR for ex lap, cystotomy with bladder repair. Intubated 7/2-7/14 PMHx: HLD, CAD, AS, HTN, gout, Afib      SLP Plan  Continue with current plan of care      Recommendations for follow up therapy are one component of a multi-disciplinary discharge planning process, led by the attending physician.  Recommendations may be updated based on patient status, additional functional criteria and insurance authorization.    Recommendations  Diet recommendations: Regular;Thin liquid Medication Administration: Whole meds with puree Supervision: Patient able to self feed Compensations: Slow rate;Small  sips/bites Postural Changes and/or Swallow Maneuvers: Seated upright 90 degrees                Oral Care Recommendations: Oral care BID Follow Up Recommendations: No SLP follow up Assistance recommended at discharge: None SLP Visit Diagnosis: Dysphagia, unspecified (R13.10) Plan: Continue with current plan of care          Sonia Baller, MA, CCC-SLP Speech Therapy

## 2022-01-15 NOTE — Progress Notes (Signed)
Mobility Specialist Progress Note   01/15/22 1208  Mobility  Activity Transferred from chair to bed  Level of Assistance +2 (takes two people)  Games developer wheel walker  Distance Ambulated (ft) 2 ft  Activity Response Tolerated well  $Mobility charge 1 Mobility   RN requesting assistance to get pt from chair to bed. Required +2A for safety d/t pt's impulsiveness. Min physical A throughout with use of RW for transfer. Pt returned to bed w/o fault, all needs met, and call bell in reach.  Holland Falling Mobility Specialist MS [facility] #:  [phone] Acute Rehab Office:  [phone]

## 2022-01-15 NOTE — Consult Note (Signed)
   [facility] Inpatient Consult   01/15/2022  [name] May 09, 1946 [id number]  [place] [facility] [ACO] Patient: [name]  Primary Care Provider:  Axel Filler, MD, [facility] Internal Medicine an Embedded provider with a chronic care management [CCM] program and team.   Patient screened for LLOS of 18 days hospitalization with noted to assess for potential [place] Management service needs for post hospital transition.  Review of patient's medical record reveals patient is being recommended for an inpatient rehabilitation transition, if approved, and bed availability at [facility] IP rehab noted.  Plan:  Continue to follow progress and disposition to assess for post hospital care management needs with disposition.    For questions contact:   Natividad Brood, RN BSN [place] Liaison  [phone] business mobile phone Toll free office [phone]  Fax number: [phone] [email or web address]

## 2022-01-15 NOTE — Hospital Course (Addendum)
[name] is a 76 y.o. male with hx of CAD, aortic stenosis, atrial fibrillation who presented with nausea, vomiting, abdominal pain, dyspnea and admitted for septic shock secondary to acute cholecystitis with associated respiratory failure.  He was initially intubated with percutaneous biliary drain placed with subsequent bladder rupture requiring surgical repair on July 8.  Extubated on July 13 and transferred to IMTS on July 15.  Symptoms and leukocytosis resolving with decreasing oxygen requirement.  He was evaluated by PT and OT and was deemed to be appropriate for acute inpatient rehab in CIR. patient and family however decided to go home with home health PT/OT.  #Perforated bladder status post surgical repair #Gross hematuria Patient was noted to have worsening renal function with hematuria noted on 7/4 status post in and out cath. Found to have a ruptured bladder on CT imaging 7/7 and was taken to the OR for an exploratory laparotomy and bladder repair. Pelvic drain was discontinued on 7/14. Hemoglobin stable status post drain removal and hematuria cleared by 7/18.  Anticoagulation with heparin was held until 7/19, when he was restarted on Coumadin.  He was on prophylactic Lovenox in the interim. He had a CT cystogram on 7/22 that showed a very small leak on the anterior aspect with contrast in the wound but no fascial dehiscence. Urology recommended wet-to-dry dressing. Patient was discharged on 7/24 with Foley in place and to continue wet-to-dry dressing at home. Urology will follow-up for outpatient cystogram in 3 weeks after discharge.  #Sepsis secondary to acute cholecystitis s/p percutaneous drain placement, resolved Patient presented in septic shock requiring pressor support. He was too critically ill to tolerate surgery, so IR placed a percutaneous cholecystostomy tube was placed on 7/2 and patient completed 7 days of IV ceftriaxone. Patient weaned off pressor support on 7/7.  Patient  reported no abdominal pain, fever, nausea, or vomiting after being transition to the IMTS and has been able to tolerate increased p.o. intake. He remained afebrile and hemodynamically stable. His leukocytosis is slowly resolving and bilious drainage from percutaneous drain decreasing over time.  IR to reassess drain following discharge and drain removal scheduled either simultaneously with subsequent cholecystectomy or following cap trial if surgery deems patient to not be a candidate for cholecystectomy.  #Acute hypoxic respiratory failure Patient initially intubated after presenting in septic shock, extubated on July 13. Respiratory exam and oxygen requirement decreased over time with Lasix diuresis and pulmonary hygiene.  Patient saturated 91% at rest and 80% while ambulating on room air and 88% on 4 L nasal cannula while ambulating, cleared for home oxygen use at this time. Ambulation with pulse ox was repeated and patient only required 2 L [place] to maintain O2 sat at 90%.  Patient was discharged home with 2 L of supplemental oxygen. Patient's need for supplemental oxygen will be evaluated in the outpatient.  #Acute metabolic encephalopathy, resolved  Patient had ICU/septic delirium which improved. He was receiving Seroquel and Klonopin which was discontinued 7/14. Patient able to answer all orientation questions appropriately.  #Permanent Valvular A-fib on Coumadin #Supra therapeutic INR, resolved   Left atrial appendage noted on CT scan though with likely artifact is no correlation on echocardiogram. On admission patient presented with a supra therapeutic INR at 7.3. This improved to 1.9 last checked on 7/4. Heparin initially started but subsequently discontinued due to worsening of gross hematuria.  Patient noted to be in A-fib but never in RVR on home Coreg. Coumadin was restarted on 7/19 after cessation  of hematuria. Patient was continued on 2.5 mg of Coumadin for the rest of his hospitalization. At  discharge, INR was 2.4. Patient was discharged home on Coumadin 2.5 mg with instructions to follow-up with INR clinic for re-evaluation.  #Hypertension #CAD #Hyperlipidemia #Moderate aortic valve stenosis Home medications include carvedilol, losartan furosemide and atorvastatin. Patient diuresed with Lasix to improve respiratory status throughout admission. Of note he was euvolemic from 7/18 to 7/19 and was not given Lasix. His home losartan was not restarted as of 7/19 because his blood pressures were l well managed on just carvedilol. Patient's losartan and Lasix were held at discharge. These medications can be restarted at outpatient setting if necessary.  #AKI Patient presented with likely prerenal AKI versus ATN in the context of septic shock with initial creatinine 3.08 creatinine subsequently normalized over the course of approximately 1 week with aggressive initial fluid resuscitation and pressor support. AKI resolved on 7/13.  #Gout Home allopurinol held. Consider restarting on discharge but might need to cover for acute flare.

## 2022-01-15 NOTE — Progress Notes (Signed)
Inpatient Rehabilitation Admissions Coordinator   I met at bedside with patient and his wife. I discussed current masking guidelines on CIR due to COVID concerns. They continue to want to pursue CIR admit. I await insurance approval and bed availability to admit.  Danne Baxter, RN, MSN Rehab Admissions Coordinator [phone] 01/15/2022 2:15 PM

## 2022-01-15 NOTE — Progress Notes (Signed)
Subjective:   Summary: [name] is a 76 y.o. year old male currently admitted on the IMTS HD#18 for septic shock secondary to acute cholecystitis and respiratory failure.  Overnight Events: -NAEON.  Patient reporting clear urine output and improving biliary drain output.  He denies abdominal pain, chest pain, fevers and states that he has been working with PT/OT and that his weakness is improving. -Pending CIR placement.  We discussed CIR with patient and he is amenable and believes this will help him get back to his baseline quickly.  Objective:  Vital signs in last 24 hours: Vitals:   01/14/22 2000 01/14/22 2229 01/15/22 0024 01/15/22 0446  BP: (!) 145/55 139/67 122/67 135/63  Pulse: 84 97 95 79  Resp: '17 20 18 18  '$ Temp:  97.9 F (36.6 C) 98 F (36.7 C) 97.7 F (36.5 C)  TempSrc:  Oral Oral Oral  SpO2: 100% 99% 100% 100%  Weight:      Height:      Remains afebrile Supplemental O2: Nasal Cannula O2 sat 100% overnight on 1L nasal cannula.  Patient on room air and saturating above 92% during assessment Maps 70s to 80s overnight.  Systolic 948A to 165V overnight and this morning  Physical Exam:  Constitutional: Chronically ill-appearing elderly man lying in bed in no acute distress.  Pleasant and responsive to questions. Cardiovascular: Tachycardic and irregular rhythm, no murmurs, rubs or gallops Pulmonary/Chest: Normal work of breathing on room air,  lungs clear to auscultation anterior lung fields, mild expiratory wheezing Abdominal: soft, non-tender, non-distended.  Percutaneous biliary drain with bilious drainage similar to yesterday.  Urinary catheter draining clear yellow-colored urine.  Erythema surrounding incision with minimal drainage and staples intact. Skin: Cold and dry Extremities: No lower extremity edema noted   Filed Weights   01/12/22 0500 01/13/22 0343 01/14/22 0355  Weight: 85.6 kg 83.9 kg 82.1 kg     Intake/Output Summary  (Last 24 hours) at 01/15/2022 0651 Last data filed at 01/15/2022 0400 Gross per 24 hour  Intake 120 ml  Output 1720 ml  Net -1600 ml    Net IO Since Admission: -43,308.49 mL [01/15/22 0651]  Biliary drain output total output 120 cc over last 24 hours Pertinent Labs:    Latest Ref Rng & Units 01/15/2022    1:50 AM 01/14/2022    1:52 AM 01/13/2022   12:20 AM  CBC  WBC 4.0 - 10.5 K/uL 16.1  16.8  18.1   Hemoglobin 13.0 - 17.0 g/dL 10.0  10.7  10.3   Hematocrit 39.0 - 52.0 % 30.7  32.8  31.4   Platelets 150 - 400 K/uL 306  281  262        Latest Ref Rng & Units 01/15/2022    1:50 AM 01/14/2022    1:52 AM 01/13/2022   12:20 AM  CMP  Glucose 70 - 99 mg/dL 108  103  106   BUN 8 - 23 mg/dL '22  26  30   '$ Creatinine 0.61 - 1.24 mg/dL 1.09  1.15  1.22   Sodium 135 - 145 mmol/L 138  142  141   Potassium 3.5 - 5.1 mmol/L 3.4  3.4  3.6   Chloride 98 - 111 mmol/L 99  102  100   CO2 22 - 32 mmol/L '27  29  28   '$ Calcium 8.9 - 10.3 mg/dL 8.0  8.3  8.2  Imaging:    Assessment/Plan:   Principal Problem:   Acute cholecystitis Active Problems:   Acute respiratory failure with hypoxia (HCC)   Encounter for biliary drainage tube placement   Hematuria   Extraperitoneal rupture of bladder   Leukocytosis   Patient Summary: [name] is a 76 y.o. male with hx of CAD, aortic stenosis, atrial fibrillation who presented with nausea, vomiting, abdominal pain, dyspnea and admitted for septic shock secondary to acute cholecystitis with associated respiratory failure.  He was initially intubated with percutaneous biliary drain placed with subsequent bladder rupture requiring surgical repair on July 8.  Extubated on July 13 and transferred to MTS on July 15.  Symptoms and leukocytosis currently resolving with decreasing oxygen requirement, evaluated to transition to CIR.  #Perforated bladder status post surgical repair #Gross hematuria Patient was noted to have worsening renal function with  hematuria noted on 7/4 status post in and out cath. Found to have a ruptured bladder on CT imaging 7/7 and was taken to the OR for an exploratory laparotomy and bladder repair. Pelvic drain was discontinued on 7/14.  Hemoglobin 10.0 this morning. Of note, discussed resuming patient's home Coumadin with urology today and they are amenable because he has not had hematuria in the past couple days on prophylactic Lovenox.  We will update them if he has hematuria again on Coumadin. - Urology following, gave Mills Koller on July 15. Recommended continuing Foley, finasteride. Cystogram scheduled on July 24. - Resume home Coumadin today - Closely monitor UOP --Trend CBC  #Sepsis secondary to acute cholecystitis s/p percutaneous drain placement, resolved Patient presented in septic shock requiring pressor support. He was too critically ill to tolerate surgery, so IR placed a percutaneous cholecystostomy tube was placed on 7/2 and patient completed 7 days of IV ceftriaxone. Patient weaned off pressor support on 7/7. Leukocytosis slowly improving at 16.1 today, patient remains afebrile.  Incision appears clean and intact with mild surrounding erythema, no concerns at this point. Perc drain output decreasing, IR note recommends drain to remain in place until IR is able to investigate patency, with possible removal during subsequent surgery, or capping drain for trial after 6 weeks. -IR is following, recommendations as above, will update them when he is closer to discharge -Continue 3 times daily flushes with 5 cc normal saline -Follow drain output every shift -General surgery signed off 7/10, recommended outpatient follow-up -Trend fever curve and WBC  #Acute hypoxic respiratory failure Mildly tachypneic overnight but saturating well on 1L nasal cannula overnight and room air this morning.  Denies dyspnea and only mild wheezing on exam, no crackles noted today.  If patient has worsening respiratory status and appears  volume overloaded, will consider resuming diuresis in the future. -Continue pulmonary hygiene - Albuterol nebs every 6 as needed -We will attempt to remain on room air with SPO2 goal greater than 93%  #Acute metabolic encephalopathy, resolved  Patient had ICU/septic delirium which improved. He was receiving Seroquel and Klonopin which was discontinued 7/14. Patient able to answer all orientation questions appropriately.  No further concerns for delirium at this moment.  Permanent Valvular A-fib on Coumadin Supra therapeutic INR, resolved   Left atrial appendage noted on CT scan though with likely artifact is no correlation on echocardiogram. On admission patient presented with a supra therapeutic INR at 7.3. This improved to 1.9 last checked on 7/4.  Heparin restarted but subsequently discontinued due to worsening of gross hematuria.  Patient noted to be in A-fib but never in  RVR and home Coreg 6.25 twice daily restarted previously.  We will restart Coumadin today - Continue home Coreg 6.25 mg twice daily - Restart Coumadin  Hypertension CAD Hyperlipidemia Moderate aortic valve stenosis Home medications include carvedilol, losartan furosemide and atorvastatin.  Blood pressures well managed while still holding home losartan and Lasix. - Coreg 6.25 mg BID as above, hold others. --Continue atorvastatin 40 mg daily   AKI: Creatinine 1.09 today, which is within his baseline range.  Volume status today reassuring, no diuresis planned. Trend BMP. Hypokalemia: Potassium 3.4 this morning, repleted with 40 mEq Gout: Holding home allopurinol for now.  Consider restarting on discharge but might need to cover for acute flare.  Diet: Normal IVF: None,None VTE: Enoxaparin, resuming Coumadin today Code:  Partial , declined CPR and defibrillation, but CODE BLUE, ACLS meds, intubation, BiPAP acceptable PT/OT recs: PT and OT recommended CIR, working on [employer] placement TOC recs:  Family Update: Wife in the  room while we spoke to patient.  Answered all questions  Dispo: Anticipated discharge pending PT reevaluation Linus Galas, MD PGY-1 Internal Medicine Resident Please contact the on call pager after 5 pm and on weekends at [phone].

## 2022-01-15 NOTE — Progress Notes (Signed)
Physical Therapy Treatment Patient Details Name: [name] MRN: [id number] DOB: March 09, 1946 Today's Date: 01/15/2022   History of Present Illness 76 yo admitted 7/1 with malaise, Abd pain and cholecystitis. 7/2 IR placed perc chole drain with intubation same date and sepsis. 7/8 pt with extraperitoneal bladder rupture to OR for ex lap, cystotomy with bladder repair. PMHx: HLD, CAD, AS, HTN, gout, Afib    PT Comments    Pt admitted with above diagnosis. Pt was able to ambulate with RW in hallway and incr distance. Needed one sitting rest break.  Pt desaturates on RA with activity and needed 4LO2 with activity. Pt is tolerating no O2 at rest.  Encouraged incentive spirometer as well. Pt will need Rehab to incr balance and endurance.  Pt currently with functional limitations due to balance and endurance deficits. Pt will benefit from skilled PT to increase their independence and safety with mobility to allow discharge to the venue listed below.      Recommendations for follow up therapy are one component of a multi-disciplinary discharge planning process, led by the attending physician.  Recommendations may be updated based on patient status, additional functional criteria and insurance authorization.  Follow Up Recommendations  Acute inpatient rehab (3hours/day)     Assistance Recommended at Discharge Frequent or constant Supervision/Assistance  Patient can return home with the following Two people to help with walking and/or transfers;Two people to help with bathing/dressing/bathroom;Direct supervision/assist for medications management;Assistance with feeding;Assist for transportation;Help with stairs or ramp for entrance;Direct supervision/assist for financial management;Assistance with cooking/housework   Equipment Recommendations  Other (comment) (TBD with progression)    Recommendations for Other Services       Precautions / Restrictions Precautions Precautions: Fall;Other  (comment) Precaution Comments: lap chole drain,  penile wounds Restrictions Weight Bearing Restrictions: No     Mobility  Bed Mobility Overal bed mobility: Needs Assistance Bed Mobility: Rolling, Sit to Supine Rolling: Min assist   Supine to sit: HOB elevated, Min assist     General bed mobility comments: Pt needed min assist to come to EOB needing assist to initiate LEs to EOB and for elevation of trunk.    Transfers Overall transfer level: Needs assistance Equipment used: Rolling walker (2 wheels) Transfers: Sit to/from Stand Sit to Stand: Min assist           General transfer comment: Steadying assist and cues for hand placement from bedside with RW.  Slow to rise.    Ambulation/Gait Ambulation/Gait assistance: Min assist, +2 safety/equipment Gait Distance (Feet): 150 Feet (75 feet x 2) Assistive device: Rolling walker (2 wheels) Gait Pattern/deviations: Step-through pattern, Decreased stride length, Trunk flexed, Wide base of support, Drifts right/left   Gait velocity interpretation: <1.31 ft/sec, indicative of household ambulator   General Gait Details: Pt needed min assist at times for ambulation as he is unsteady at tiems, needs assist to steer RW and for sequencing steps and RW. Pt on no oxygen on arrival as nurse trying to wean and sats 91%.  Pt needed 4LO2 with ambulation with sats appeared to maintain >88%.  Once back to room, sats on RA > 90% and O2 removed.Obtained incentive spirometer and had pt practice and educated him to perform 10 x hour.   Stairs             Wheelchair Mobility    Modified Rankin (Stroke Patients Only)       Balance Overall balance assessment: Needs assistance Sitting-balance support: Bilateral upper extremity supported, Feet supported, No  upper extremity supported Sitting balance-Leahy Scale: Fair Sitting balance - Comments: min guard assist, quick to fatigue   Standing balance support: Bilateral upper extremity  supported Standing balance-Leahy Scale: Poor Standing balance comment: reliant on UE support in standing, posterior sway without external support.                            Cognition Arousal/Alertness: Awake/alert Behavior During Therapy: WFL for tasks assessed/performed Overall Cognitive Status: Impaired/Different from baseline Area of Impairment: Attention, Memory, Following commands, Safety/judgement, Problem solving, Awareness                 Orientation Level: Disoriented to, Time, Situation Current Attention Level: Sustained Memory: Decreased short-term memory Following Commands: Follows one step commands consistently, Follows one step commands with increased time Safety/Judgement: Decreased awareness of safety, Decreased awareness of deficits Awareness: Intellectual Problem Solving: Slow processing, Requires verbal cues, Requires tactile cues General Comments: pleasant, participatory, some decreased insight into (balance) deficits. follows commands consistently but also benefits from safety cues for DME use        Exercises General Exercises - Lower Extremity Ankle Circles/Pumps: AROM, Both, 10 reps, Seated Long Arc Quad: AROM, Both, 10 reps, Seated Other Exercises Other Exercises: Incentive spirometer x 10 reps an hour    General Comments General comments (skin integrity, edema, etc.): wife present      Pertinent Vitals/Pain Pain Assessment Pain Assessment: Faces Faces Pain Scale: Hurts little more Pain Location: abdomen Pain Descriptors / Indicators: Sore Pain Intervention(s): Limited activity within patient's tolerance, Monitored during session, Repositioned    Home Living                          Prior Function            PT Goals (current goals can now be found in the care plan section) Acute Rehab PT Goals Patient Stated Goal: be able to return home Progress towards PT goals: Progressing toward goals    Frequency     Min 3X/week      PT Plan Current plan remains appropriate    Co-evaluation              AM-PAC PT "6 Clicks" Mobility   Outcome Measure  Help needed turning from your back to your side while in a flat bed without using bedrails?: A Lot Help needed moving from lying on your back to sitting on the side of a flat bed without using bedrails?: A Lot Help needed moving to and from a bed to a chair (including a wheelchair)?: A Lot Help needed standing up from a chair using your arms (e.g., wheelchair or bedside chair)?: A Little Help needed to walk in hospital room?: Total Help needed climbing 3-5 steps with a railing? : Total 6 Click Score: 11    End of Session Equipment Utilized During Treatment: Gait belt;Oxygen Activity Tolerance: Patient limited by fatigue Patient left: with family/visitor present;in chair;with call bell/phone within reach;with chair alarm set Nurse Communication: Mobility status PT Visit Diagnosis: Difficulty in walking, not elsewhere classified (R26.2);Muscle weakness (generalized) (M62.81);Other abnormalities of gait and mobility (R26.89)     Time: 9509-3267 PT Time Calculation (min) (ACUTE ONLY): 32 min  Charges:  $Gait Training: 8-22 mins $Therapeutic Exercise: 8-22 mins                     Kendan Cornforth M,PT Acute [employer] [phone]  Alvira Philips 01/15/2022, 12:31 PM

## 2022-01-15 NOTE — Progress Notes (Signed)
[place] for warfarin Indication: atrial fibrillation  No Known Allergies  Patient Measurements: Height: '5\' 6"'$  (167.6 cm) Weight: 82.1 kg (181 lb) IBW/kg (Calculated) : 63.8 Heparin Dosing Weight: 87.2 kg  Vital Signs: Temp: 98 F (36.7 C) (07/19 0751) Temp Source: Oral (07/19 0751) BP: 130/63 (07/19 0751) Pulse Rate: 96 (07/19 0751)  Labs: Recent Labs    01/13/22 0020 01/14/22 0152 01/15/22 0150  HGB 10.3* 10.7* 10.0*  HCT 31.4* 32.8* 30.7*  PLT 262 281 306  CREATININE 1.22 1.15 1.09     Estimated Creatinine Clearance: 59.8 mL/min (by C-G formula based on SCr of 1.09 mg/dL).   Assessment: 76 yo M with history of Afib on warfarin PTA. Admitted with cholecysititis now s/p percutaneous drain placement. Pt's bladder perforated on 7/7 and is now s/p surgical repair on 7/8 and has been on continuous bladder irrigation (CBI) through 7/12. CBI d/c'd and urology ok with resuming anticoagulation but hematuria increased and IV heparin stopped 7/16. Lovenox for VTE prophylaxis started. Pharmacy consulted to resume warfarin for Afib.  Home dose 2.'5mg'$  daily except '5mg'$  MWF  Last INR was 1.9 on 7/4 and has had no warfarin since then. No bleeding noted, clear yellow urine reported, Hgb stable 10s, platelets are normal.  Goal of Therapy:  INR 2-3 Monitor platelets by anticoagulation protocol: Yes   Plan:  Warfarin 5 mg PO tonight INR daily Monitor for s/sx of bleeding  Thank you for involving pharmacy in this patient's care.  Renold Genta, PharmD, BCPS Clinical Pharmacist Clinical phone for 01/15/2022 until 3p is [phone] 01/15/2022 11:50 AM

## 2022-01-15 NOTE — Consult Note (Addendum)
WOC received consult for pressure injury to buttocks. Patient just received breakfast. Will return shortly to assess. Transferred from [place] yesterday.  9:07- WOC Nurse Consult Note: Patient receiving care in [facility] [phone] Transferred from [facility] yesterday Reason for Consult: pressure injury to buttocks Wound type: scattered stage 2 on left and right buttocks Pressure Injury POA: No Wound bed: Yellow and pink Drainage (amount, consistency, odor) scant Periwound: erythematous with no pain Dressing procedure/placement/frequency: Cleanse the sacral area with soap and water, rinse and pat dry. Apply Xeroform gauze over the left and right buttock to cover open areas and secure with foam dressing. Change Xeroform daily to assess. Inform WOC if wounds evolve.   Cathlean Marseilles Tamala Julian, MSN, RN, [place], Lysle Pearl, [facility] Wound Treatment Associate Pager [phone]

## 2022-01-15 NOTE — Care Management Important Message (Signed)
Important Message  Patient Details  Name: [name] MRN: [id number] Date of Birth: [birth year]   Medicare Important Message Given:  Yes     Orbie Pyo 01/15/2022, 12:37 PM

## 2022-01-15 NOTE — Progress Notes (Addendum)
11 Days Post-Op   Subjective/Chief Complaint:  1 - Bladder Rupture - s/p open repair of bladder rupture 01/03/22 by Alinda Money. JP Cr 1.4 7/13 (same as serum). JP removed 7/15. Urine remains clear yellow today  No acute events overnight. Hemoglobin remains stable. Urine clear draining well. Midline incision with some persistent mild erythema but overall well appearing and has not extended past demarcated borders.   Objective: Vital signs in last 24 hours: Temp:  [97.7 F (36.5 C)-98.7 F (37.1 C)] 98 F (36.7 C) (07/19 0751) Pulse Rate:  [79-100] 96 (07/19 0751) Resp:  [16-24] 16 (07/19 0751) BP: (82-145)/(54-78) 130/63 (07/19 0751) SpO2:  [93 %-100 %] 93 % (07/19 0751) Last BM Date : 01/13/22  Intake/Output from previous day: 07/18 0701 - 07/19 0700 In: 120 [P.O.:120] Out: 1720 [Urine:1600; Drains:120] Intake/Output this shift: No intake/output data recorded.  Stigmata of chonic disease, frail, no acute distress Some use of accessory muscles with breathing Obese abdomen, midline incision with staples. C/d/I, mild erythema demarcated around midline incision, well within drawn borders Chole drain I place with dark green non-foul fluid.  Foley with clear urine, no clots, yellow  Lab Results:  Recent Labs    01/14/22 0152 01/15/22 0150  WBC 16.8* 16.1*  HGB 10.7* 10.0*  HCT 32.8* 30.7*  PLT 281 306    BMET Recent Labs    01/14/22 0152 01/15/22 0150  NA 142 138  K 3.4* 3.4*  CL 102 99  CO2 29 27  GLUCOSE 103* 108*  BUN 26* 22  CREATININE 1.15 1.09  CALCIUM 8.3* 8.0*    PT/INR No results for input(s): "LABPROT", "INR" in the last 72 hours. ABG No results for input(s): "PHART", "HCO3" in the last 72 hours.  Invalid input(s): "PCO2", "PO2"  Studies/Results: No results found.  Anti-infectives: Anti-infectives (From admission, onward)    Start     Dose/Rate Route Frequency Ordered Stop   01/04/22 0900  cefTRIAXone (ROCEPHIN) 1 g in sodium chloride 0.9 % 100  mL IVPB        1 g 200 mL/hr over 30 Minutes Intravenous Every 24 hours 01/04/22 0807 01/11/22 1930   01/01/22 1400  cefTRIAXone (ROCEPHIN) 2 g in sodium chloride 0.9 % 100 mL IVPB  Status:  Discontinued        2 g 200 mL/hr over 30 Minutes Intravenous Every 24 hours 01/01/22 0842 01/03/22 0755   12/28/21 1600  piperacillin-tazobactam (ZOSYN) IVPB 3.375 g  Status:  Discontinued        3.375 g 12.5 mL/hr over 240 Minutes Intravenous Every 8 hours 12/28/21 1419 01/01/22 0842   12/28/21 1600  cefOXitin (MEFOXIN) 2 g in sodium chloride 0.9 % 100 mL IVPB  Status:  Discontinued        2 g 200 mL/hr over 30 Minutes Intravenous To Radiology 12/28/21 1509 12/29/21 1437   12/28/21 1256  vancomycin variable dose per unstable renal function (pharmacist dosing)  Status:  Discontinued         Does not apply See admin instructions 12/28/21 1256 12/28/21 1419   12/28/21 1015  piperacillin-tazobactam (ZOSYN) IVPB 3.375 g        3.375 g 100 mL/hr over 30 Minutes Intravenous  Once 12/28/21 1001 12/28/21 1029   12/28/21 1015  vancomycin (VANCOREADY) IVPB 1750 mg/350 mL        1,750 mg 175 mL/hr over 120 Minutes Intravenous  Once 12/28/21 1003 12/28/21 1254       Assessment/Plan:  1 - Bladder Rupture -  now s/p repair. Continue foley. Anticipate XR cystogram 01/20/22, trial of void pending results. Staples to be removed by urology at bedside in the coming days.   Greatly appreciate IM team comanagment.    Florentina Addison 01/15/2022  He is doing well. Denies pain. Foley to gravity. Plan for cystogram early next week. Staples to be removed later this week.  [facility]  Pager: [phone]

## 2022-01-16 DIAGNOSIS — K810 Acute cholecystitis: Secondary | ICD-10-CM | POA: Diagnosis not present

## 2022-01-16 LAB — CBC
HCT: 30.7 % — ABNORMAL LOW (ref 39.0–52.0)
Hemoglobin: 10.3 g/dL — ABNORMAL LOW (ref 13.0–17.0)
MCH: 31.1 pg (ref 26.0–34.0)
MCHC: 33.6 g/dL (ref 30.0–36.0)
MCV: 92.7 fL (ref 80.0–100.0)
Platelets: 305 10*3/uL (ref 150–400)
RBC: 3.31 MIL/uL — ABNORMAL LOW (ref 4.22–5.81)
RDW: 14.6 % (ref 11.5–15.5)
WBC: 12.6 10*3/uL — ABNORMAL HIGH (ref 4.0–10.5)
nRBC: 0 % (ref 0.0–0.2)

## 2022-01-16 LAB — BASIC METABOLIC PANEL
Anion gap: 10 (ref 5–15)
BUN: 21 mg/dL (ref 8–23)
CO2: 24 mmol/L (ref 22–32)
Calcium: 8.1 mg/dL — ABNORMAL LOW (ref 8.9–10.3)
Chloride: 104 mmol/L (ref 98–111)
Creatinine, Ser: 1.24 mg/dL (ref 0.61–1.24)
GFR, Estimated: >60 mL/min (ref >60)
Glucose, Bld: 95 mg/dL (ref 70–99)
Potassium: 3.5 mmol/L (ref 3.5–5.1)
Sodium: 138 mmol/L (ref 135–145)

## 2022-01-16 LAB — PROTIME-INR
INR: 1.4 — ABNORMAL HIGH (ref 0.8–1.2)
Prothrombin Time: 17.2 seconds — ABNORMAL HIGH (ref 11.4–15.2)

## 2022-01-16 MED ORDER — WARFARIN SODIUM 5 MG PO TABS
5.0000 mg | ORAL_TABLET | Freq: Once | ORAL | Status: AC
  Administered 2022-01-16: 5 mg via ORAL
  Filled 2022-01-16: qty 1

## 2022-01-16 NOTE — TOC Initial Note (Signed)
Transition of Care [facility]) - Initial/Assessment Note    Patient Details  Name: PRENTIS LANGDON MRN: [id number] Date of Birth: 1945/10/28  Transition of Care [facility]) CM/SW Contact:    Tresa Endo Phone Number: 01/16/2022, 3:35 PM  Clinical Narrative:                 CSW received SNF consult. CSW met with pt spouse. CSW introduced self and explained role at the hospital. Pt reports that PTA the pt lived home with spouse. PT reports pt is able to ambulate with a RW, pt is able to do some ADLs but requires SNF to gain strength.   CSW reviewed PT/OT recommendations for SNF. Pt gave CSW permission to fax out to facilities in the area. Pt spouse requested [facility], CSW contacted [facility] who will review pt information.   CSW will continue to follow.    Expected Discharge Plan: Long Term Acute Care (LTAC) Barriers to Discharge: Continued Medical Work up, Requiring sitter/restraints, Ship broker   Patient Goals and CMS Choice   CMS Medicare.gov Compare Post Acute Care list provided to:: Patient Represenative (must comment) Choice offered to / list presented to : Spouse  Expected Discharge Plan and Services Expected Discharge Plan: [place] (LTAC)       Living arrangements for the past 2 months: Single Family Home                                      Prior Living Arrangements/Services Living arrangements for the past 2 months: Single Family Home Lives with:: Spouse          Need for Family Participation in Patient Care: Yes (Comment) Care giver support system in place?: Yes (comment)   Criminal Activity/Legal Involvement Pertinent to Current Situation/Hospitalization: No - Comment as needed  Activities of Daily Living Home Assistive Devices/Equipment: Dentures (specify type) (Dentures upper and lower) ADL Screening (condition at time of admission) Patient's cognitive ability adequate to safely complete daily activities?: Yes Is the  patient deaf or have difficulty hearing?: No Does the patient have difficulty seeing, even when wearing glasses/contacts?: No Does the patient have difficulty concentrating, remembering, or making decisions?: No Patient able to express need for assistance with ADLs?: No Does the patient have difficulty dressing or bathing?: No Independently performs ADLs?: Yes (appropriate for developmental age) Does the patient have difficulty walking or climbing stairs?: No Weakness of Legs: Both Weakness of Arms/Hands: Both  Permission Sought/Granted                  Emotional Assessment Appearance:: Appears stated age Attitude/Demeanor/Rapport: Unable to Assess Affect (typically observed): Unable to Assess   Alcohol / Substance Use: Not Applicable Psych Involvement: No (comment)  Admission diagnosis:  Acute cholecystitis [K81.0] Patient Active Problem List   Diagnosis Date Noted   Pressure injury of skin 01/15/2022   Acute respiratory failure with hypoxia ([place]) 01/11/2022   Encounter for biliary drainage tube placement 01/11/2022   Hematuria 01/11/2022   Extraperitoneal rupture of bladder 01/11/2022   Leukocytosis 01/11/2022   Acute cholecystitis 12/28/2021   Dyspnea on exertion 07/29/2021   Acquired thrombophilia ([place]) 04/22/2019   Encounter for therapeutic drug monitoring 03/28/2019   Atrial fibrillation ([place]) 03/04/2019   Flat foot, acquired, left 01/24/2019   Gout 01/24/2019   Prediabetes 01/24/2019   Osteoarthritis of both knees 12/12/2014   Tubular adenoma 10/07/2013  Seborrheic keratosis 10/07/2013   Healthcare maintenance 10/07/2013   Allergic rhinitis 08/08/2013   Essential hypertension 08/03/2013   Sciatica associated with disorder of lumbar spine 08/03/2013   Aortic stenosis 09/08/2011   Hyperlipidemia 10/02/2009   CAD (coronary artery disease) 10/02/2009   PCP:  Axel Filler, MD Pharmacy:   [facility] - [place], [place] [address] Phone: [phone] Fax: [phone]  [place] [address] [place] - [address] [place] ([place]) [place] [address] Phone: [phone] Fax: [phone]     Social Determinants of Health (SDOH) Interventions    Readmission Risk Interventions     No data to display

## 2022-01-16 NOTE — Progress Notes (Signed)
Speech Language Pathology Treatment: Dysphagia  Patient Details Name: [name] MRN: [id number] DOB: 17-Nov-1945 Today's Date: 01/16/2022 Time: 6283-1517 SLP Time Calculation (min) (ACUTE ONLY): 12 min  Assessment / Plan / Recommendation Clinical Impression  Pt was seen with a portion of his lunch meal with intermittent, strong coughing observed immediately after swallowing. Pt attributed this to drinks being cold in temperature, but it also happened at times with solids. SLP tried to assist with repositioning, cues for small sips, and removal of straw, but intermittent coughing is still observed until pt stops PO intake. Note that there has been some intermittent throat clear and delayed coughing reported in previous SLP notes. Recommend proceeding with MBS as can be scheduled in order to more directly assess swallowing.    HPI HPI: 76 yo admitted 7/1 with malaise, Abd pain and cholecystitis. 7/2 IR placed perc chole drain with intubation same date and sepsis. 7/8 pt with extraperitoneal bladder rupture to OR for ex lap, cystotomy with bladder repair. Intubated 7/2-7/14 PMHx: HLD, CAD, AS, HTN, gout, Afib      SLP Plan  MBS      Recommendations for follow up therapy are one component of a multi-disciplinary discharge planning process, led by the attending physician.  Recommendations may be updated based on patient status, additional functional criteria and insurance authorization.    Recommendations  Diet recommendations: Regular;Thin liquid Liquids provided via: Cup;Straw Medication Administration: Whole meds with puree Supervision: Patient able to self feed Compensations: Slow rate;Small sips/bites Postural Changes and/or Swallow Maneuvers: Seated upright 90 degrees                Oral Care Recommendations: Oral care BID Follow Up Recommendations: No SLP follow up Assistance recommended at discharge: None SLP Visit Diagnosis: Dysphagia, unspecified (R13.10) Plan:  MBS           Osie Bond., M.A. [place] Office [phone]  Secure chat preferred   01/16/2022, 2:03 PM

## 2022-01-16 NOTE — NC FL2 (Signed)
[place] LEVEL OF CARE SCREENING TOOL     IDENTIFICATION  Patient Name: [name] Birthdate: 10-23-1945 Sex: male Admission Date (Current Location): 12/28/2021  [facility] and [place] Number:  Herbalist and Address:  [facility] [place], [place] [address], [place], [place] [address]      Provider Number: [id number]  Attending Physician Name and Address:  Axel Filler, *  Relative Name and Phone Number:  Raahil Ong [phone]    Current Level of Care: SNF Recommended Level of Care: [place] Prior Approval Number:    Date Approved/Denied:   PASRR Number: [id number]  Discharge Plan: SNF    Current Diagnoses: Patient Active Problem List   Diagnosis Date Noted   Pressure injury of skin 01/15/2022   Acute respiratory failure with hypoxia ([place]) 01/11/2022   Encounter for biliary drainage tube placement 01/11/2022   Hematuria 01/11/2022   Extraperitoneal rupture of bladder 01/11/2022   Leukocytosis 01/11/2022   Acute cholecystitis 12/28/2021   Dyspnea on exertion 07/29/2021   Acquired thrombophilia ([place]) 04/22/2019   Encounter for therapeutic drug monitoring 03/28/2019   Atrial fibrillation ([place]) 03/04/2019   Flat foot, acquired, left 01/24/2019   Gout 01/24/2019   Prediabetes 01/24/2019   Osteoarthritis of both knees 12/12/2014   Tubular adenoma 10/07/2013   Seborrheic keratosis 10/07/2013   Healthcare maintenance 10/07/2013   Allergic rhinitis 08/08/2013   Essential hypertension 08/03/2013   Sciatica associated with disorder of lumbar spine 08/03/2013   Aortic stenosis 09/08/2011   Hyperlipidemia 10/02/2009   CAD (coronary artery disease) 10/02/2009    Orientation RESPIRATION BLADDER Height & Weight     Self, Time, Place  Normal, O2 (oxygen at 4LPM) Continent Weight: 82.1 kg Height:  '5\' 6"'$  (167.6 cm)  BEHAVIORAL SYMPTOMS/MOOD NEUROLOGICAL BOWEL NUTRITION STATUS      Continent Diet  (See discharge instructions)  AMBULATORY STATUS COMMUNICATION OF NEEDS Skin   Extensive Assist Verbally  (incision abdomen)                       Personal Care Assistance Level of Assistance  Bathing, Feeding, Dressing Bathing Assistance: Maximum assistance Feeding assistance: Limited assistance Dressing Assistance: Maximum assistance     Functional Limitations Info             SPECIAL CARE FACTORS FREQUENCY  PT (By licensed PT), OT (By licensed OT)     PT Frequency: 5 x week OT Frequency: 5 xweek            Contractures Contractures Info: Not present    Additional Factors Info  Code Status Code Status Info: Partial code: no CPR or defibrillation             Current Medications (01/16/2022):  This is the current hospital active medication list Current Facility-Administered Medications  Medication Dose Route Frequency Provider Last Rate Last Admin   acetaminophen (TYLENOL) tablet 650 mg  650 mg Oral Q6H PRN Jardin, Carla G, RPH       albuterol (PROVENTIL) (2.5 MG/3ML) 0.083% nebulizer solution 2.5 mg  2.5 mg Nebulization Q6H PRN Linus Galas, MD       atorvastatin (LIPITOR) tablet 40 mg  40 mg Oral Daily Rehman, Areeg N, DO   40 mg at 01/16/22 0930   carvedilol (COREG) tablet 6.25 mg  6.25 mg Oral BID WC Lacinda Axon, MD   6.25 mg at 01/16/22 0930   Chlorhexidine Gluconate Cloth 2 % PADS 6  each  6 each Topical Daily Icard, Bradley L, DO   6 each at 01/16/22 0931   docusate sodium (COLACE) capsule 100 mg  100 mg Oral BID PRN Kaleen Mask, RPH       enoxaparin (LOVENOX) injection 40 mg  40 mg Subcutaneous Q24H Linus Galas, MD   40 mg at 01/16/22 1200   feeding supplement (ENSURE ENLIVE / ENSURE PLUS) liquid 237 mL  237 mL Oral TID BM Mannam, Praveen, MD   237 mL at 01/16/22 1409   finasteride (PROSCAR) tablet 5 mg  5 mg Oral Daily [name], MD   5 mg at 01/16/22 0930   Gerhardt's butt cream   Topical BID Mannam, Hart Robinsons, MD    Given at 01/16/22 0931   lip balm (CARMEX) ointment   Topical PRN Candee Furbish, MD       multivitamin with minerals tablet 1 tablet  1 tablet Oral Daily Mannam, Praveen, MD   1 tablet at 01/16/22 0930   ondansetron (ZOFRAN) injection 4 mg  4 mg Intravenous Q6H PRN Icard, Octavio Graves, DO       Oral care mouth rinse  15 mL Mouth Rinse PRN Candee Furbish, MD       polyethylene glycol (MIRALAX / GLYCOLAX) packet 17 g  17 g Oral Daily PRN Jennelle Human B, NP       senna-docusate (Senokot-S) tablet 1 tablet  1 tablet Oral QHS Kaleen Mask, RPH   1 tablet at 01/15/22 2239   warfarin (COUMADIN) tablet 5 mg  5 mg Oral ONCE-1600 [place]e Jude, [facility]       Warfarin - Pharmacist Dosing Inpatient   Does not apply q1600 Alvira Philips, [facility]   Given at 01/15/22 1704     Discharge Medications: Please see discharge summary for a list of discharge medications.  Relevant Imaging Results:  Relevant Lab Results:   Additional Information SSN [id number]  Verdell Carmine, RN

## 2022-01-16 NOTE — TOC Progression Note (Signed)
Transition of Care [facility]) - Progression Note    Patient Details  Name: ILDEFONSO KEANEY MRN: [id number] Date of Birth: 07-12-1945  Transition of Care [facility]) CM/SW [place], RN Phone Number: 01/16/2022, 3:50 PM  Clinical Narrative:    Phoebe Perch for SNF placement done. PASSR approved. Called PAULA ZIETZ to update her, as the nurse stated she had called and was worried about him leaving quickly. NO answer. Could not leave a message.   Expected Discharge Plan: Long Term Acute Care (LTAC) Barriers to Discharge: Continued Medical Work up, Requiring sitter/restraints, Orthoptist and Services Expected Discharge Plan: [place] (LTAC)       Living arrangements for the past 2 months: Single Family Home                                       Social Determinants of Health (SDOH) Interventions    Readmission Risk Interventions     No data to display

## 2022-01-16 NOTE — Progress Notes (Signed)
Inpatient Rehabilitation Admissions Coordinator   Insurance has denied CIR/AIR level rehab. I met with patient and wife at bedside. Wife is requesting SNF placement. I have alerted acute team and TOC. We will sign off at this time.  Danne Baxter, RN, MSN Rehab Admissions Coordinator [phone] 01/16/2022 12:58 PM

## 2022-01-16 NOTE — Progress Notes (Signed)
[place] for warfarin Indication: atrial fibrillation  No Known Allergies  Patient Measurements: Height: '5\' 6"'$  (167.6 cm) Weight: 82.1 kg (181 lb) IBW/kg (Calculated) : 63.8 Heparin Dosing Weight: 87.2 kg  Vital Signs: Temp: 98.5 F (36.9 C) (07/20 0804) Temp Source: Axillary (07/20 0804) BP: 131/74 (07/20 0804) Pulse Rate: 89 (07/20 0804)  Labs: Recent Labs    01/14/22 0152 01/15/22 0150 01/16/22 0601  HGB 10.7* 10.0* 10.3*  HCT 32.8* 30.7* 30.7*  PLT 281 306 305  LABPROT  --   --  17.2*  INR  --   --  1.4*  CREATININE 1.15 1.09 1.24     Estimated Creatinine Clearance: 52.6 mL/min (by C-G formula based on SCr of 1.24 mg/dL).   Assessment: 76 yo M with history of Afib on warfarin PTA. Admitted with cholecysititis now s/p percutaneous drain placement. Pt's bladder perforated on 7/7 and is now s/p surgical repair on 7/8 and has been on continuous bladder irrigation (CBI) through 7/12. CBI d/c'd and urology ok with resuming anticoagulation but hematuria increased and IV heparin stopped 7/16. Lovenox for VTE prophylaxis started. Admit INR was 7.3 s/p IV Vit K '10mg'$  on 7/1. Pharmacy consulted to resume warfarin for Afib.  INR 1.4 after '5mg'$  x1.  H/H, plt stable.  Home dose 2.'5mg'$  daily except '5mg'$  MWF    Goal of Therapy:  INR 2-3 Monitor platelets by anticoagulation protocol: Yes   Plan:  Warfarin 5 mg PO tonight Stop enoxaparin when INR is >/= 2 INR daily Monitor for s/sx of bleeding  Thank you for involving pharmacy in this patient's care.  Benetta Spar, PharmD, BCPS, BCCP Clinical Pharmacist  Please check AMION for all [place] phone numbers After 10:00 PM, call [place] [phone]

## 2022-01-16 NOTE — Progress Notes (Signed)
Subjective:   Summary: [name] is a 76 y.o. year old male currently admitted on the IMTS HD#19 for septic shock secondary to acute cholecystitis and respiratory failure.  Overnight Events: -NAEON.  Able to move between bed and chairs successfully with assistance.  Oxygenation test yesterday showed patient needs 4 L of oxygen for ambulation, could reevaluate at rehab - Patient endorsing increasing strength with PT visits and ambulation.  Denies fever abdominal pain, nausea or vomiting, chest pain, dyspnea, comfortable on room air - No confusion noted today - Insurance denied CIR, TOC looking for SNF now  Objective:  Vital signs in last 24 hours: Vitals:   01/15/22 0751 01/15/22 1628 01/15/22 2046 01/16/22 0300  BP: 130/63 134/76 129/73 132/74  Pulse: 96 83 100 96  Resp: '16 17  18  '$ Temp: 98 F (36.7 C) (!) 97.5 F (36.4 C) 97.6 F (36.4 C) 97.8 F (36.6 C)  TempSrc: Oral Oral Oral Oral  SpO2: 93% 95% 99% 96%  Weight:      Height:      Remains afebrile Supplemental O2: Room air 96 to 99% on room air.  Maps 86, 88 overnight.  Systolic 867E to 720N overnight and this morning  Physical Exam:  Constitutional: Chronically ill-appearing elderly man lying in bed in no acute distress.  Pleasant and responsive to questions. Cardiovascular: Nontachycardic and irregular rhythm, no murmurs, rubs or gallops Pulmonary/Chest: Normal work of breathing on room air,  lungs clear to auscultation anterior lung fields Abdominal: soft, non-tender, non-distended.  Percutaneous biliary drain with bilious drainage similar to yesterday.  Urinary catheter draining clear amber urine.  Erythema surrounding incision with minimal drainage and staples intact. Skin: Warm and dry Extremities: No lower extremity edema noted   Filed Weights   01/12/22 0500 01/13/22 0343 01/14/22 0355  Weight: 85.6 kg 83.9 kg 82.1 kg     Intake/Output Summary (Last 24 hours) at 01/16/2022  0738 Last data filed at 01/16/2022 0538 Gross per 24 hour  Intake 125 ml  Output 1400 ml  Net -1275 ml    Net IO Since Admission: -44,583.49 mL [01/16/22 0738]  Biliary drain output total output 225 cc over last 24 hours Pertinent Labs:    Latest Ref Rng & Units 01/16/2022    6:01 AM 01/15/2022    1:50 AM 01/14/2022    1:52 AM  CBC  WBC 4.0 - 10.5 K/uL 12.6  16.1  16.8   Hemoglobin 13.0 - 17.0 g/dL 10.3  10.0  10.7   Hematocrit 39.0 - 52.0 % 30.7  30.7  32.8   Platelets 150 - 400 K/uL 305  306  281        Latest Ref Rng & Units 01/16/2022    6:01 AM 01/15/2022    1:50 AM 01/14/2022    1:52 AM  CMP  Glucose 70 - 99 mg/dL 95  108  103   BUN 8 - 23 mg/dL '21  22  26   '$ Creatinine 0.61 - 1.24 mg/dL 1.24  1.09  1.15   Sodium 135 - 145 mmol/L 138  138  142   Potassium 3.5 - 5.1 mmol/L 3.5  3.4  3.4   Chloride 98 - 111 mmol/L 104  99  102   CO2 22 - 32 mmol/L '24  27  29   '$ Calcium 8.9 - 10.3 mg/dL 8.1  8.0  8.3  Imaging:    Assessment/Plan:   Principal Problem:   Acute cholecystitis Active Problems:   Acute respiratory failure with hypoxia (HCC)   Encounter for biliary drainage tube placement   Hematuria   Extraperitoneal rupture of bladder   Leukocytosis   Pressure injury of skin   Patient Summary: [name] is a 76 y.o. male with hx of CAD, aortic stenosis, atrial fibrillation who presented with nausea, vomiting, abdominal pain, dyspnea and admitted for septic shock secondary to acute cholecystitis with associated respiratory failure.  He was initially intubated with percutaneous biliary drain placed with subsequent bladder rupture requiring surgical repair on July 8.  Extubated on July 13 and transferred to MTS on July 15.  Symptoms and leukocytosis currently resolving with decreasing oxygen requirement, pending SNF placement now    #Perforated bladder status post surgical repair #Gross hematuria Patient was noted to have worsening renal function with  hematuria noted on 7/4 status post in and out cath. Found to have a ruptured bladder on CT imaging 7/7 and was taken to the OR for an exploratory laparotomy and bladder repair. Pelvic drain was discontinued on 7/14.  Hemoglobin 10.3 this morning.  Restarted Coumadin yesterday, will update urology if patient has recurrent hematuria - Urology following, gave Mills Koller on July 15. Recommended continuing Foley, finasteride. Cystogram scheduled on July 24. - Continue Coumadin per pharmacy - Closely monitor UOP --Trend CBC  #Sepsis secondary to acute cholecystitis s/p percutaneous drain placement, resolved Leukocytosis slowly improving at 12.6 today, patient remains afebrile.  Denies any symptoms this morning.  Incision appears clean and intact with mild surrounding erythema, no concerns at this point. -IR is following, recommends drain in place with follow-up postdischarge as detailed in prior note -Continue 3 times daily flushes with 5 cc normal saline -Follow drain output every shift -General surgery signed off 7/10, recommended outpatient follow-up -Trend fever curve and WBC  #Acute hypoxic respiratory failure Satting well overnight on room air.  Denies any dyspnea at rest on room air, mentions he is able to ambulate but sometimes needs oxygen. Ambulation to yesterday revealed need for 4 L nasal cannula upon exertion.  If patient has worsening respiratory status and appears volume overloaded, will consider resuming diuresis in the future. -Continue pulmonary hygiene - Albuterol nebs every 6 as needed -We will attempt to remain on room air at rest with SPO2 goal greater than 92%.  Up to 4 L nasal cannula as needed while ambulating.  #Acute metabolic encephalopathy, resolved  Patient had ICU/septic delirium which improved. He was receiving Seroquel and Klonopin which was discontinued 7/14. Patient able to answer all orientation questions appropriately.  Mild confusion yesterday, alert and oriented  this morning. -Delirium precautions  Permanent Valvular A-fib on Coumadin Supra therapeutic INR, resolved   Left atrial appendage noted on CT scan though with likely artifact is no correlation on echocardiogram. On admission patient presented with a supra therapeutic INR at 7.3. This improved to 1.9 last checked on 7/4.  Heparin restarted but subsequently discontinued due to worsening of gross hematuria.  Patient noted to be in A-fib but never in RVR and home Coreg 6.25 twice daily restarted previously.  Coumadin restarted, patient will remain on prophylactic Lovenox until Coumadin is therapeutic. - Continue home Coreg 6.25 mg twice daily - Continue Coumadin per pharmacy, PPx Lovenox until Coumadin is therapeutic level.  Hypertension CAD Hyperlipidemia Moderate aortic valve stenosis Home medications include carvedilol, losartan furosemide and atorvastatin.  Blood pressures well managed while still holding home  losartan and Lasix.  Patient is euvolemic today, no diuresis needed. - Coreg 6.25 mg BID as above, hold others. --Continue atorvastatin 40 mg daily   AKI: Creatinine 1.24 today, which is within his baseline range.  Volume status today reassuring, no diuresis planned. Trend BMP. Hypokalemia: Potassium 3.5 this morning Gout: Holding home allopurinol for now.  Consider restarting on discharge but might need to cover for acute flare.  Diet: Normal IVF: None,None VTE: Enoxaparin, resuming Coumadin today Code:  Partial , declined CPR and defibrillation, but CODE BLUE, ACLS meds, intubation, BiPAP acceptable PT/OT recs: PT/OT recommended CIR however patient's insurance did not cover this, now pending SNF placement.   TOC recs: SNF placement Family Update:   Dispo: Patient is medically stable, now we are pending SNF placement  Linus Galas, MD PGY-1 Internal Medicine Resident Please contact the on call pager after 5 pm and on weekends at [phone].

## 2022-01-17 ENCOUNTER — Inpatient Hospital Stay (HOSPITAL_COMMUNITY): Payer: Medicare HMO

## 2022-01-17 LAB — CBC
HCT: 30.6 % — ABNORMAL LOW (ref 39.0–52.0)
Hemoglobin: 10 g/dL — ABNORMAL LOW (ref 13.0–17.0)
MCH: 30.8 pg (ref 26.0–34.0)
MCHC: 32.7 g/dL (ref 30.0–36.0)
MCV: 94.2 fL (ref 80.0–100.0)
Platelets: 304 10*3/uL (ref 150–400)
RBC: 3.25 MIL/uL — ABNORMAL LOW (ref 4.22–5.81)
RDW: 14.6 % (ref 11.5–15.5)
WBC: 11.3 10*3/uL — ABNORMAL HIGH (ref 4.0–10.5)
nRBC: 0 % (ref 0.0–0.2)

## 2022-01-17 LAB — BASIC METABOLIC PANEL
Anion gap: 10 (ref 5–15)
BUN: 17 mg/dL (ref 8–23)
CO2: 25 mmol/L (ref 22–32)
Calcium: 8 mg/dL — ABNORMAL LOW (ref 8.9–10.3)
Chloride: 105 mmol/L (ref 98–111)
Creatinine, Ser: 1.14 mg/dL (ref 0.61–1.24)
GFR, Estimated: >60 mL/min (ref >60)
Glucose, Bld: 96 mg/dL (ref 70–99)
Potassium: 3.7 mmol/L (ref 3.5–5.1)
Sodium: 140 mmol/L (ref 135–145)

## 2022-01-17 LAB — PROTIME-INR
INR: 1.7 — ABNORMAL HIGH (ref 0.8–1.2)
Prothrombin Time: 19.4 seconds — ABNORMAL HIGH (ref 11.4–15.2)

## 2022-01-17 MED ORDER — SODIUM CHLORIDE 0.9 % IV SOLN: 1.0000 g | INTRAVENOUS | Status: DC

## 2022-01-17 MED ORDER — PIPERACILLIN-TAZOBACTAM 3.375 G IVPB
3.3750 g | Freq: Three times a day (TID) | INTRAVENOUS | Status: DC
  Administered 2022-01-17 – 2022-01-18 (×3): 3.375 g via INTRAVENOUS
  Filled 2022-01-17 (×2): qty 50

## 2022-01-17 MED ORDER — SODIUM CHLORIDE 0.45 % IV SOLN
INTRAVENOUS | Status: DC
Start: 2022-01-17 — End: 2022-01-18

## 2022-01-17 NOTE — Progress Notes (Addendum)
Mobility Specialist Progress Note:   01/17/22 1445  Mobility  Activity Transferred to/from [facility]  Level of Assistance Minimal assist, patient does 75% or more  Assistive Device None;Other (Comment) (HHA)  Distance Ambulated (ft) 4 ft  Activity Response Tolerated well  $Mobility charge 1 Mobility   Pt requesting to transfer to [facility] for BM. Required minA to stand and pivot to BSC, small BM successful. Pt SOB throughout transfers, cues for PLB required throughout. Back in bed with all needs met.   Nelta Numbers Acute Rehab Secure Chat or Office Phone: [phone]

## 2022-01-17 NOTE — Progress Notes (Addendum)
13 Days Post-Op   Subjective/Chief Complaint:  1 - Bladder Rupture - s/p open repair of bladder rupture 01/03/22 by Alinda Money. JP Cr 1.4 7/13 (same as serum). JP removed 7/15. Urine remains clear yellow today  No acute events overnight. Hemoglobin remains stable. Staples removed today at bedside with immediate dehiscence of skin and return of thick serosanguinous fluid. Abdominal defect probed and could not identify any clear defect in the fascia although exam was limited due to patient body habitus. Picture uploaded to media   Objective: Vital signs in last 24 hours: Temp:  [97.5 F (36.4 C)-98.4 F (36.9 C)] 97.6 F (36.4 C) (07/21 0808) Pulse Rate:  [74-92] 92 (07/21 0808) Resp:  [15-18] 18 (07/21 0808) BP: (129-140)/(63-74) 134/64 (07/21 0808) SpO2:  [96 %-98 %] 97 % (07/21 0808) Weight:  [78.9 kg] 78.9 kg (07/21 0436) Last BM Date : 01/16/22  Intake/Output from previous day: 07/20 0701 - 07/21 0700 In: 60 [P.O.:60] Out: 800 [Urine:700; Drains:100] Intake/Output this shift: No intake/output data recorded.  Stigmata of chonic disease, frail, no acute distress Some use of accessory muscles with breathing Obese abdomen, midline incision open packed with wet to dry dressing. Chole drain I place with dark green non-foul fluid.  Foley with clear urine, no clots, yellow  Lab Results:  Recent Labs    01/16/22 0601 01/17/22 0425  WBC 12.6* 11.3*  HGB 10.3* 10.0*  HCT 30.7* 30.6*  PLT 305 304    BMET Recent Labs    01/16/22 0601 01/17/22 0425  NA 138 140  K 3.5 3.7  CL 104 105  CO2 24 25  GLUCOSE 95 96  BUN 21 17  CREATININE 1.24 1.14  CALCIUM 8.1* 8.0*    PT/INR Recent Labs    01/16/22 0601 01/17/22 0425  LABPROT 17.2* 19.4*  INR 1.4* 1.7*   ABG No results for input(s): "PHART", "HCO3" in the last 72 hours.  Invalid input(s): "PCO2", "PO2"  Studies/Results: DG Swallowing Func-Speech Pathology  Result Date: 01/17/2022 Table formatting from the  original result was not included. Objective Swallowing Evaluation: Type of Study: MBS-Modified Barium Swallow Study  Patient Details Name: [name] MRN: [id number] Date of Birth: September 19, 1945 Today's Date: 01/17/2022 Time: SLP Start Time (ACUTE ONLY): 0920 -SLP Stop Time (ACUTE ONLY): 0940 SLP Time Calculation (min) (ACUTE ONLY): 20 min Past Medical History: Past Medical History: Diagnosis Date  Allergic rhinitis 08/08/2013  takes Loratadine daily   Aortic stenosis 09/08/2011  With mild aortic regurgitation, mean gradient 13 mmHg   Cataract   right but immature  Complication of anesthesia   stopped breathing - during shoulder surgery 2009-2010  Coronary artery disease 10/02/2009  Cardiac cath (October 2007): 20% left main, 60% mid LAD, 60-70% distal LAD, 30-40% first diagonal, 30% circumflex, 40% OM, 30-40% RCA   Degenerative joint disease of shoulder 08/03/2013  Bilateral, s/p right total shoulder arthroplasty 05/29/2011 and left shoulder arthroplasty 58/02/9832  Diastolic dysfunction 82/10/537  Echo (04/04/2016): Grade I  Diverticulosis 10/07/2013  Seen on colonoscopy in 2007   Erectile dysfunction 05/07/2009  Essential hypertension 08/03/2013  had been on Lisinopril but stopped per MD  First degree AV block 03/14/2016  Gastric ulcer 10/07/2013  Seen on EGD 04/10/2006   Hemorrhoids 10/07/2013  Hyperlipidemia 10/02/2009  Paroxysmal atrial fibrillation ([place]) 07/14/2006  One episode, provoked by alcohol use disorder, briefly anticoagulated with warfarin, then switched to aspirin alone  Sciatica associated with disorder of lumbar spine 08/03/2013  Anterolisthesis with right L 4-5 nerve root compression.  Treated with epidural injections and Physical Therapy.   Seborrheic keratosis 10/07/2013  Tubular adenoma of colon  Past Surgical History: Past Surgical History: Procedure Laterality Date  CARDIAC CATHETERIZATION  2007  CARDIOVERSION    CARDIOVERSION N/A 08/04/2019  Procedure: CARDIOVERSION;  Surgeon: Fay Records, MD;  Location: [place];  Service: Cardiovascular;  Laterality: N/A;  COLONOSCOPY    CYSTOSCOPY N/A 01/04/2022  Procedure: Consuela Mimes;  Surgeon: Raynelle Bring, MD;  Location: [place];  Service: Urology;  Laterality: N/A;  INSERTION OF MESH N/A 03/11/2019  Procedure: Insertion Of Mesh;  Surgeon: Ralene Ok, MD;  Location: [place];  Service: General;  Laterality: N/A;  IR PERC CHOLECYSTOSTOMY  12/29/2021  JOINT REPLACEMENT    KNEE ARTHROSCOPY WITH MENISCAL REPAIR Right 01/30/2011  LAPAROTOMY N/A 01/04/2022  Procedure: EXPLORATORY LAPAROTOMY WITH EXTRAPERITONEAL BLADDER REPAIR;  Surgeon: Raynelle Bring, MD;  Location: [place];  Service: Urology;  Laterality: N/A;  right finger surgery    as a child  TONSILLECTOMY    TOTAL KNEE ARTHROPLASTY Left 07/28/2017  Procedure: LEFT TOTAL KNEE ARTHROPLASTY;  Surgeon: Renette Butters, MD;  Location: [place];  Service: Orthopedics;  Laterality: Left;  TOTAL SHOULDER ARTHROPLASTY  05/29/2011  Procedure: TOTAL SHOULDER ARTHROPLASTY;  Surgeon: Metta Clines Supple;  Location: [place];  Service: Orthopedics;  Laterality: Right;  TOTAL SHOULDER ARTHROPLASTY Left 06/08/2014  DR SUPPLE  TOTAL SHOULDER ARTHROPLASTY Left 06/08/2014  Procedure: LEFT TOTAL SHOULDER ARTHROPLASTY;  Surgeon: Marin Shutter, MD;  Location: [place];  Service: Orthopedics;  Laterality: Left;  UMBILICAL HERNIA REPAIR N/A 03/11/2019  Procedure: LAPAROSCOPIC UMBILICAL HERNIA;  Surgeon: Ralene Ok, MD;  Location: [place];  Service: General;  Laterality: N/A; HPI: 76 yo admitted 7/1 with malaise, Abd pain and cholecystitis. 7/2 IR placed perc chole drain with intubation same date and sepsis. 7/8 pt with extraperitoneal bladder rupture to OR for ex lap, cystotomy with bladder repair. Intubated 7/2-7/14 PMHx: HLD, CAD, AS, HTN, gout, Afib  No data recorded  Recommendations for follow up therapy are one component of a multi-disciplinary discharge planning process, led by the attending physician.  Recommendations may be updated based on patient  status, additional functional criteria and insurance authorization. Assessment / Plan / Recommendation   01/17/2022  10:20 AM Clinical Impressions Clinical Impression Pt presents with oropharyngeal dysphagia characterized by reduction in bolus cohesion, tongue base retraction, and pharyngeal constriction. He demonstrated vallecular residue, and posterior pharyngeal wall residue. Residue was improved with reduced bolus sizes and with a liquid wash. Penetration (PAS 5) and aspiration (PAS 7, 8) were noted with thin liquids. Throat clearing and delayed coughing were inconsistently noted. Prompted coughing did propel the aspirate above the vocal folds, but was ineffective in expelling it from the larynx and it was subsequently aspirated again. Penetration and aspiration were eliminated with use of a chin tuck posture and avoidance of straws. A regular texture diet with thin liquids is recommended at this time with strict observance of swallowing precautions. SLP will follow for dysphagia treatment. SLP Visit Diagnosis Dysphagia, oropharyngeal phase (R13.12) Impact on safety and function Mild aspiration risk     01/17/2022  10:20 AM Treatment Recommendations Treatment Recommendations Therapy as outlined in treatment plan below     01/17/2022  10:20 AM Prognosis Prognosis for Safe Diet Advancement Good   01/17/2022  10:20 AM Diet Recommendations SLP Diet Recommendations Regular solids;Thin liquid Liquid Administration via Cup;No straw Medication Administration Whole meds with puree Compensations Slow rate;Small sips/bites;Follow solids with liquid;Chin tuck Postural Changes Seated  upright at 90 degrees     01/17/2022  10:20 AM Other Recommendations Oral Care Recommendations Oral care BID Follow Up Recommendations Skilled nursing-short term rehab (<3 hours/day) Functional Status Assessment Patient has had a recent decline in their functional status and demonstrates the ability to make significant improvements in function in a  reasonable and predictable amount of time.   01/17/2022  10:20 AM Frequency and Duration  Speech Therapy Frequency (ACUTE ONLY) min 2x/week Treatment Duration 2 weeks     01/17/2022  10:20 AM Oral Phase Oral Phase Impaired Oral - Nectar Cup Decreased bolus cohesion;Premature spillage Oral - Nectar Straw Decreased bolus cohesion;Premature spillage Oral - Thin Cup Decreased bolus cohesion;Premature spillage Oral - Thin Straw Decreased bolus cohesion;Premature spillage Oral - Puree WFL Oral - Regular [facility]    01/17/2022  10:20 AM Pharyngeal Phase Pharyngeal Phase Impaired Pharyngeal- Nectar Cup Reduced tongue base retraction;Pharyngeal residue - valleculae;Pharyngeal residue - posterior pharnyx;Reduced pharyngeal peristalsis Pharyngeal- Nectar Straw Reduced tongue base retraction;Pharyngeal residue - valleculae;Pharyngeal residue - posterior pharnyx;Reduced pharyngeal peristalsis Pharyngeal- Thin Cup Reduced tongue base retraction;Pharyngeal residue - valleculae;Pharyngeal residue - posterior pharnyx;Reduced pharyngeal peristalsis;Penetration/Aspiration before swallow;Penetration/Aspiration during swallow Pharyngeal Material enters airway, CONTACTS cords and not ejected out;Material enters airway, passes BELOW cords and not ejected out despite cough attempt by patient;Material enters airway, passes BELOW cords without attempt by patient to eject out (silent aspiration) Pharyngeal- Thin Straw Reduced tongue base retraction;Pharyngeal residue - valleculae;Pharyngeal residue - posterior pharnyx;Reduced pharyngeal peristalsis;Penetration/Aspiration during swallow;Penetration/Aspiration before swallow Pharyngeal Material enters airway, CONTACTS cords and not ejected out;Material enters airway, passes BELOW cords without attempt by patient to eject out (silent aspiration);Material enters airway, passes BELOW cords and not ejected out despite cough attempt by patient Pharyngeal- Puree Reduced tongue base retraction;Pharyngeal  residue - valleculae;Pharyngeal residue - posterior pharnyx;Reduced pharyngeal peristalsis Pharyngeal- Regular Reduced tongue base retraction;Pharyngeal residue - valleculae;Pharyngeal residue - posterior pharnyx;Reduced pharyngeal peristalsis Pharyngeal- Pill Reduced tongue base retraction;Pharyngeal residue - valleculae;Pharyngeal residue - posterior pharnyx;Reduced pharyngeal peristalsis    01/17/2022  10:20 AM Cervical Esophageal Phase  Cervical Esophageal Phase [facility] [name], [place], [place] Office number [phone] Pager [phone] [name] 01/17/2022, 10:32 AM                      Anti-infectives: Anti-infectives (From admission, onward)    Start     Dose/Rate Route Frequency Ordered Stop   01/04/22 0900  cefTRIAXone (ROCEPHIN) 1 g in sodium chloride 0.9 % 100 mL IVPB        1 g 200 mL/hr over 30 Minutes Intravenous Every 24 hours 01/04/22 0807 01/11/22 1930   01/01/22 1400  cefTRIAXone (ROCEPHIN) 2 g in sodium chloride 0.9 % 100 mL IVPB  Status:  Discontinued        2 g 200 mL/hr over 30 Minutes Intravenous Every 24 hours 01/01/22 0842 01/03/22 0755   12/28/21 1600  piperacillin-tazobactam (ZOSYN) IVPB 3.375 g  Status:  Discontinued        3.375 g 12.5 mL/hr over 240 Minutes Intravenous Every 8 hours 12/28/21 1419 01/01/22 0842   12/28/21 1600  cefOXitin (MEFOXIN) 2 g in sodium chloride 0.9 % 100 mL IVPB  Status:  Discontinued        2 g 200 mL/hr over 30 Minutes Intravenous To Radiology 12/28/21 1509 12/29/21 1437   12/28/21 1256  vancomycin variable dose per unstable renal function (pharmacist dosing)  Status:  Discontinued  Does not apply See admin instructions 12/28/21 1256 12/28/21 1419   12/28/21 1015  piperacillin-tazobactam (ZOSYN) IVPB 3.375 g        3.375 g 100 mL/hr over 30 Minutes Intravenous  Once 12/28/21 1001 12/28/21 1029   12/28/21 1015  vancomycin (VANCOREADY) IVPB 1750 mg/350 mL        1,750 mg 175 mL/hr over  120 Minutes Intravenous  Once 12/28/21 1003 12/28/21 1254       Assessment/Plan:  1 - Bladder Rupture - now s/p repair. Continue foley. Anticipate XR cystogram 01/20/22, trial of void pending results.  2 - Midline dehiscence - expect fascia is intact but cannot be sure due to body habitus limiting exam as defect is fairly deep. Also question underlying abscess/fluid collection as cause of closure breakdown - CT abdomen pelvis with IV contrast - NPO - Please hold warfarin - Please start IV antibiotics, duration tbd based on imaging results   Greatly appreciate IM team comanagment.    Florentina Addison 01/17/2022  Examined at bedside this afternoon. His staples were taken down mid-morning with return of serosanguinous fluid. Pt resting comfortably. Denies significant pain. No nausea or emesis. Has been having bowel movements. Foley draining clear yellow urine.  Afebrile, regular rate and rhythm Dressing taken down with lower midline incision open. Dressing mildly saturated with serosanguinous fluid. No purulence. Probed fascia and appears to be intact along its entire length except for a small area at the inferior aspect, I am able to push a q-tip a bit deeper. Again, no return of peritoneal fluid. Unable to visualize PDS sutures. Thick rind along base of wound  -Agree with above, fascia likely intact however possible small inferior dehiscence. CT scan was ordered late this AM, however, unfortunately still not done.  -F/u CT A/P and cystogram -May require OR for fascial closure pending findings -Discussed with Dr. Gloriann Loan who is on call this weekend. -Keep NPO -Continue antibiotics   LOS: 20 days   Matt R. Sanjeev Main MD 01/17/2022, 4:09 PM [facility]  Pager: [phone]

## 2022-01-17 NOTE — Progress Notes (Signed)
Modified Barium Swallow Progress Note  Patient Details  Name: [name] MRN: [id number] Date of Birth: 1945/11/08  Today's Date: 01/17/2022  Modified Barium Swallow completed.  Full report located under Chart Review in the Imaging Section.  Brief recommendations include the following:  Clinical Impression  Pt presents with oropharyngeal dysphagia characterized by reduction in bolus cohesion, tongue base retraction, and pharyngeal constriction. He demonstrated vallecular residue, and posterior pharyngeal wall residue. Residue was improved with reduced bolus sizes and with a liquid wash. Penetration (PAS 5) and aspiration (PAS 7, 8) were noted with thin liquids. Throat clearing and delayed coughing were inconsistently noted. Prompted coughing did propel the aspirate above the vocal folds, but was ineffective in expelling it from the larynx and it was subsequently aspirated again. Penetration and aspiration were eliminated with use of a chin tuck posture and avoidance of straws. A regular texture diet with thin liquids is recommended at this time with strict observance of swallowing precautions. SLP will follow for dysphagia treatment.   Swallow Evaluation Recommendations       SLP Diet Recommendations: Regular solids;Thin liquid   Liquid Administration via: Cup;No straw   Medication Administration: Whole meds with puree   Supervision: Patient able to self feed;Intermittent supervision to cue for compensatory strategies   Compensations: Slow rate;Small sips/bites;Follow solids with liquid;Chin tuck   Postural Changes: Seated upright at 90 degrees   Oral Care Recommendations: Oral care BID      [name], [place], [place] Office number [phone] Pager [phone]   [name] 01/17/2022,10:31 AM

## 2022-01-17 NOTE — Progress Notes (Signed)
Per Jodell Cipro, MD he spoke with urology and they want to leave the cath in for now, they want to further evaluate. Foley not taken out.

## 2022-01-17 NOTE — Progress Notes (Signed)
Per Raylene Miyamoto, MD patients code status is now full code. DNR bracelet removed from patients wrist.

## 2022-01-17 NOTE — Progress Notes (Signed)
Pharmacy Antibiotic Note  [name] is a 76 y.o. male admitted on 12/28/2021 with septic shock 2/2 perforated bladder now s/p repair. Also being treated for acute cholecystitis. Pharmacy has been consulted for Zosyn dosing.  Plan: Zosyn 3.375g Q8h  Trend WBC, fever, renal function F/u cultures, clinical progress, levels as indicated De-escalate when able   Height: '5\' 6"'$  (167.6 cm) Weight: 78.9 kg (174 lb 0.2 oz) IBW/kg (Calculated) : 63.8  Temp (24hrs), Avg:98 F (36.7 C), Min:97.6 F (36.4 C), Max:98.4 F (36.9 C)  Recent Labs  Lab 01/13/22 0020 01/14/22 0152 01/15/22 0150 01/16/22 0601 01/17/22 0425  WBC 18.1* 16.8* 16.1* 12.6* 11.3*  CREATININE 1.22 1.15 1.09 1.24 1.14    Estimated Creatinine Clearance: 56.1 mL/min (by C-G formula based on SCr of 1.14 mg/dL).    No Known Allergies  Antimicrobials this admission: Ceftriaxone 7/15 x1   Thank you for allowing pharmacy to be a part of this patient's care.  Ardyth Harps, PharmD Clinical Pharmacist

## 2022-01-17 NOTE — Progress Notes (Signed)
Physical Therapy Treatment Patient Details Name: [name] MRN: [id number] DOB: 12/14/1945 Today's Date: 01/17/2022   History of Present Illness 76 yo admitted 7/1 with malaise, Abd pain and cholecystitis. 7/2 IR placed perc chole drain with intubation same date and sepsis. 7/8 pt with extraperitoneal bladder rupture to OR for ex lap, cystotomy with bladder repair. PMHx: HLD, CAD, AS, HTN, gout, Afib    PT Comments    Pt admitted with above diagnosis. Pt was able to ambulate with min assist and progressing each day.  Continues to need cues and assist for pursed lip breathing with standing rest breaks. Agree that pt will benefit from SNF for rehab prior to d/c home and can incr endurance prior to d/c home. Daughter present and agrees as well. Issued gait belt.  Pt currently with functional limitations due to balance and endurance deficits. Pt will benefit from skilled PT to increase their independence and safety with mobility to allow discharge to the venue listed below.      Recommendations for follow up therapy are one component of a multi-disciplinary discharge planning process, led by the attending physician.  Recommendations may be updated based on patient status, additional functional criteria and insurance authorization.  Follow Up Recommendations  Skilled nursing-short term rehab (<3 hours/day)     Assistance Recommended at Discharge Frequent or constant Supervision/Assistance  Patient can return home with the following Two people to help with walking and/or transfers;Two people to help with bathing/dressing/bathroom;Direct supervision/assist for medications management;Assistance with feeding;Assist for transportation;Help with stairs or ramp for entrance;Direct supervision/assist for financial management;Assistance with cooking/housework   Equipment Recommendations  Other (comment) (TBD with progression)    Recommendations for Other Services       Precautions / Restrictions  Precautions Precautions: Fall;Other (comment) Precaution Comments: lap chole drain,  penile wounds Restrictions Weight Bearing Restrictions: No     Mobility  Bed Mobility Overal bed mobility: Needs Assistance Bed Mobility: Rolling, Sit to Supine Rolling: Min assist   Supine to sit: HOB elevated, Min assist     General bed mobility comments: Pt needed min assist to come to EOB needing assist to initiate LEs to EOB and for elevation of trunk.    Transfers Overall transfer level: Needs assistance Equipment used: Rolling walker (2 wheels) Transfers: Sit to/from Stand Sit to Stand: Min assist           General transfer comment: Steadying assist and cues for hand placement from bedside with RW.  Slow to rise. Had daughter assist as she was curious as to how much assist pt needs.    Ambulation/Gait Ambulation/Gait assistance: Min assist Gait Distance (Feet): 150 Feet Assistive device: Rolling walker (2 wheels) Gait Pattern/deviations: Step-through pattern, Decreased stride length, Trunk flexed, Wide base of support, Drifts right/left   Gait velocity interpretation: <1.31 ft/sec, indicative of household ambulator   General Gait Details: Pt needs assist to steer RW and for sequencing steps and RW. Daughter present and used gait belt to assist pt.  Pt needs standing rest breaks and cues for PLB.  Pt on no oxygen on arrival with sats 91%.  Pt needed 3LO2 with ambulation with sats appeared to maintain >86% but difficult to get a good reading.  Once back to room, sats on RA > 90% and O2 removed.  Continued to encourage incentive spirometer and had pt practice and educated him to perform 10 x hour.   Stairs             Emergency planning/management officer  Modified Rankin (Stroke Patients Only)       Balance Overall balance assessment: Needs assistance Sitting-balance support: Bilateral upper extremity supported, Feet supported, No upper extremity supported Sitting balance-Leahy  Scale: Fair Sitting balance - Comments: min guard assist, quick to fatigue Postural control: Posterior lean Standing balance support: Bilateral upper extremity supported Standing balance-Leahy Scale: Poor Standing balance comment: reliant on UE support in standing, posterior sway without external support.                            Cognition Arousal/Alertness: Awake/alert Behavior During Therapy: WFL for tasks assessed/performed Overall Cognitive Status: Impaired/Different from baseline Area of Impairment: Attention, Memory, Following commands, Safety/judgement, Problem solving, Awareness                 Orientation Level: Disoriented to, Time, Situation Current Attention Level: Sustained Memory: Decreased short-term memory Following Commands: Follows one step commands consistently, Follows one step commands with increased time Safety/Judgement: Decreased awareness of safety, Decreased awareness of deficits Awareness: Intellectual Problem Solving: Slow processing, Requires verbal cues, Requires tactile cues General Comments: pleasant, participatory, some decreased insight into (balance) deficits. follows commands consistently but also benefits from safety cues for DME use        Exercises General Exercises - Lower Extremity Ankle Circles/Pumps: AROM, Both, 10 reps, Seated Long Arc Quad: AROM, Both, 10 reps, Seated Other Exercises Other Exercises: Incentive spirometer x 10 reps an hour    General Comments General comments (skin integrity, edema, etc.): daughter present      Pertinent Vitals/Pain Pain Assessment Pain Assessment: Faces Faces Pain Scale: Hurts little more Pain Location: abdomen Pain Descriptors / Indicators: Sore Pain Intervention(s): Limited activity within patient's tolerance, Monitored during session, Repositioned    Home Living                          Prior Function            PT Goals (current goals can now be found in  the care plan section) Acute Rehab PT Goals Patient Stated Goal: be able to return home Progress towards PT goals: Progressing toward goals    Frequency    Min 3X/week      PT Plan Discharge plan needs to be updated    Co-evaluation              AM-PAC PT "6 Clicks" Mobility   Outcome Measure  Help needed turning from your back to your side while in a flat bed without using bedrails?: A Lot Help needed moving from lying on your back to sitting on the side of a flat bed without using bedrails?: A Lot Help needed moving to and from a bed to a chair (including a wheelchair)?: A Lot Help needed standing up from a chair using your arms (e.g., wheelchair or bedside chair)?: A Little Help needed to walk in hospital room?: A Little Help needed climbing 3-5 steps with a railing? : A Lot 6 Click Score: 14    End of Session Equipment Utilized During Treatment: Gait belt;Oxygen Activity Tolerance: Patient limited by fatigue Patient left: with family/visitor present;in chair;with call bell/phone within reach;with chair alarm set Nurse Communication: Mobility status PT Visit Diagnosis: Difficulty in walking, not elsewhere classified (R26.2);Muscle weakness (generalized) (M62.81);Other abnormalities of gait and mobility (R26.89)     Time: 0086-7619 PT Time Calculation (min) (ACUTE ONLY): 28 min  Charges:  $Gait Training: 23-37  mins                     [facility] M,PT Acute Rehab Services [phone]    Alvira Philips 01/17/2022, 3:54 PM

## 2022-01-17 NOTE — TOC Progression Note (Signed)
Transition of Care [facility]) - Progression Note    Patient Details  Name: [name] MRN: [id number] Date of Birth: 1946-05-22  Transition of Care [facility]) CM/SW [place], [place] Phone Number: 01/17/2022, 1:12 PM  Clinical Narrative:    Pt has a bed at [facility], auth approved ref# [id number]. Pt is not medically stable for DC today. CSW attempted to contact Olivia Mackie at [facility] to inquire about a weekend DC, no answer. CSW will follow up when Olivia Mackie is available to provide update.   Expected Discharge Plan: Long Term Acute Care (LTAC) Barriers to Discharge: Continued Medical Work up, Requiring sitter/restraints, Orthoptist and Services Expected Discharge Plan: [place] (LTAC)       Living arrangements for the past 2 months: Single Family Home                                       Social Determinants of Health (SDOH) Interventions    Readmission Risk Interventions     No data to display

## 2022-01-17 NOTE — Progress Notes (Signed)
I spoke with the patient's wife, Ardie Mclennan, at 1800 on 01/17/2022.  She mentions that she spoke with the patient's children and that the family wanted him to be changed to full code.  They believe this also reflects what he intended and that full code better represents his wishes compared to the previous partial DNR documentation.  We discussed this at length and she confirmed.  I changed his status to full code.  Of note, the patient might be wearing a DNR bracelet.  This should be removed to accurately represent his status.

## 2022-01-17 NOTE — Progress Notes (Signed)
Subjective:   Summary: [name] is a 76 y.o. year old male currently admitted on the IMTS HD#20 for septic shock secondary to acute cholecystitis and respiratory failure.  Overnight Events: - Patient is increasingly confused this morning - Patient endorsing mild tenderness surrounding his abdominal incision.  During our assessment, when patient sat up he released significant amount of fluid, likely from his incision site or catheter insertion site.  Potentially could also be urine.  Discussed with urology and upon their assessment they believe he might have a wound dehiscence following evaluation after removing his staples from his incision.  We discussed diagnostic work-up and plan as below. - Insurance denied CIR, TOC looking for SNF now  Objective:  Vital signs in last 24 hours: Vitals:   01/16/22 1557 01/16/22 2037 01/17/22 0347 01/17/22 0436  BP: 133/74 140/63 129/64   Pulse: 92 74 91   Resp: '17 15 18   '$ Temp: (!) 97.5 F (36.4 C) 98.4 F (36.9 C)    TempSrc: Oral     SpO2: 98% 96% 97%   Weight:    78.9 kg  Height:      Remains afebrile Supplemental O2: Room air 96 to 97 % on room air.  Maps 85 overnight.  Systolic 621H to 086V  overnight and this morning  Physical Exam:  Constitutional: Chronically ill-appearing elderly man lying in bed in no acute distress.  Pleasant and responsive to questions. Cardiovascular: Nontachycardic and irregular rhythm, no murmurs, rubs or gallops Pulmonary/Chest: Normal work of breathing on room air, lung sounds absent in bilateral lung bases, no crackles or wheezing abdominal: soft, non-tender, non-distended.  Percutaneous biliary drain with bilious drainage similar to yesterday.  Urinary catheter draining clear urine.  Erythema surrounding incision with minimal drainage and staples intact.  Patient noted to have clear fluid overlying his lower abdomen and groin following assessment. Skin: Warm and dry Extremities: No  lower extremity edema noted   Filed Weights   01/13/22 0343 01/14/22 0355 01/17/22 0436  Weight: 83.9 kg 82.1 kg 78.9 kg     Intake/Output Summary (Last 24 hours) at 01/17/2022 0641 Last data filed at 01/17/2022 0610 Gross per 24 hour  Intake 60 ml  Output 800 ml  Net -740 ml    Net IO Since Admission: -45,323.49 mL [01/17/22 0641]  Biliary drain output total output 100 cc over last 24 hours Pertinent Labs:    Latest Ref Rng & Units 01/17/2022    4:25 AM 01/16/2022    6:01 AM 01/15/2022    1:50 AM  CBC  WBC 4.0 - 10.5 K/uL 11.3  12.6  16.1   Hemoglobin 13.0 - 17.0 g/dL 10.0  10.3  10.0   Hematocrit 39.0 - 52.0 % 30.6  30.7  30.7   Platelets 150 - 400 K/uL 304  305  306        Latest Ref Rng & Units 01/17/2022    4:25 AM 01/16/2022    6:01 AM 01/15/2022    1:50 AM  CMP  Glucose 70 - 99 mg/dL 96  95  108   BUN 8 - 23 mg/dL '17  21  22   '$ Creatinine 0.61 - 1.24 mg/dL 1.14  1.24  1.09   Sodium 135 - 145 mmol/L 140  138  138   Potassium 3.5 - 5.1 mmol/L 3.7  3.5  3.4   Chloride 98 - 111 mmol/L  105  104  99   CO2 22 - 32 mmol/L '25  24  27   '$ Calcium 8.9 - 10.3 mg/dL 8.0  8.1  8.0    INR 1.7 Imaging: CT abdomen pelvis with contrast pending CT cystogram pending   Assessment/Plan:   Principal Problem:   Acute cholecystitis Active Problems:   Acute respiratory failure with hypoxia (HCC)   Encounter for biliary drainage tube placement   Hematuria   Extraperitoneal rupture of bladder   Leukocytosis   Pressure injury of skin   Patient Summary: [name] is a 76 y.o. male with hx of CAD, aortic stenosis, atrial fibrillation who presented with nausea, vomiting, abdominal pain, dyspnea and admitted for septic shock secondary to acute cholecystitis with associated respiratory failure.  He was initially intubated with percutaneous biliary drain placed with subsequent bladder rupture requiring surgical repair on July 8.  Extubated on July 13 and transferred to MTS on  July 15.  Symptoms and leukocytosis currently resolving with decreasing oxygen requirement, pending SNF placement now    #Perforated bladder status post surgical repair #Gross hematuria Patient was noted to have worsening renal function with hematuria noted on 7/4 status post in and out cath. Found to have a ruptured bladder on CT imaging 7/7 and was taken to the OR for an exploratory laparotomy and bladder repair. Pelvic drain was discontinued on 7/14.  Hemoglobin 10.0 this morning.  Some concern for dehiscence this morning, holding Coumadin and n.p.o. this afternoon, will hold Lovenox as well if urology decides to take patient to the OR. - Urology following, concern for dehiscence today, will further evaluate with abdominal CT with contrast and CT cystogram.  N.p.o. in anticipation of possible procedure tomorrow and Coumadin held for now.  We will hold Lovenox if urology wants to take patient to the OR. - Continue finasteride - Foley in place.  Closely monitor UOP --Trend CBC  #Sepsis secondary to acute cholecystitis s/p percutaneous drain placement, resolved Leukocytosis slowly improving at 11.3 today, patient remains afebrile.  Endorses mild tenderness around abdominal incision this morning.  Exam findings and concerns for dehiscence as above.  No concerns for biliary drain at this point. -IR is following, recommends drain in place with follow-up postdischarge as detailed in prior note -Continue 3 times daily flushes with 5 cc normal saline -Follow drain output every shift -General surgery signed off 7/10, recommended outpatient follow-up -Trend fever curve and WBC  #Acute hypoxic respiratory failure Satting well overnight on room air.  Denies any dyspnea at rest on room air, mentions he is able to ambulate but sometimes needs oxygen. Ambulation to yesterday revealed need for 4 L nasal cannula upon exertion.  If patient has worsening respiratory status and appears volume overloaded, will  consider resuming diuresis in the future. -Continue pulmonary hygiene - Albuterol nebs every 6 as needed -We will attempt to remain on room air at rest with SPO2 goal greater than 92%.  Up to 4 L nasal cannula as needed while ambulating.  #Acute metabolic encephalopathy, resolved  Patient had ICU/septic delirium which improved. He was receiving Seroquel and Klonopin which was discontinued 7/14. Patient able to answer all orientation questions appropriately.  Mild confusion yesterday, alert and oriented this morning. -Delirium precautions  Permanent Valvular A-fib on Coumadin Supra therapeutic INR, resolved   Patient noted to be in A-fib but never in RVR on home Coreg 6.25 twice daily.  Coumadin restarted, INR still subtherapeutic today, patient will remain on prophylactic Lovenox until Coumadin  is therapeutic. - Continue home Coreg 6.25 mg twice daily - Hold Coumadin due to concern for dehiscence.  Patient is still subtherapeutic  Hypertension CAD Hyperlipidemia Moderate aortic valve stenosis Home medications include carvedilol, losartan furosemide and atorvastatin.  Blood pressures well managed while still holding home losartan and Lasix.  Patient is euvolemic today, no diuresis needed. - Coreg 6.25 mg BID as above, hold others. --Continue atorvastatin 40 mg daily   AKI: Creatinine 1.14 today, which is within his baseline range.  Volume status today reassuring, no diuresis planned. Trend BMP. Hypokalemia: Resolved Gout: Holding home allopurinol for now.  Consider restarting on discharge but might need to cover for acute flare.  Diet: Normal IVF: None,None VTE: Enoxaparin, will hold if patient requires operative intervention tomorrow code:  Partial , declined CPR and defibrillation, but CODE BLUE, ACLS meds, intubation, BiPAP acceptable PT/OT recs: PT/OT recommended CIR however patient's insurance did not cover this, now pending SNF placement.   TOC recs: SNF placement Family Update:    Dispo: Concern for wound dehiscence today, discharge pending further evaluation  Linus Galas, MD PGY-1 Internal Medicine Resident Please contact the on call pager after 5 pm and on weekends at [phone].

## 2022-01-18 ENCOUNTER — Inpatient Hospital Stay (HOSPITAL_COMMUNITY): Payer: Medicare HMO

## 2022-01-18 LAB — BASIC METABOLIC PANEL
Anion gap: 7 (ref 5–15)
BUN: 16 mg/dL (ref 8–23)
CO2: 26 mmol/L (ref 22–32)
Calcium: 7.7 mg/dL — ABNORMAL LOW (ref 8.9–10.3)
Chloride: 104 mmol/L (ref 98–111)
Creatinine, Ser: 1.29 mg/dL — ABNORMAL HIGH (ref 0.61–1.24)
GFR, Estimated: 58 mL/min — ABNORMAL LOW (ref >60)
Glucose, Bld: 95 mg/dL (ref 70–99)
Potassium: 3.7 mmol/L (ref 3.5–5.1)
Sodium: 137 mmol/L (ref 135–145)

## 2022-01-18 LAB — CBC
HCT: 29 % — ABNORMAL LOW (ref 39.0–52.0)
Hemoglobin: 9.7 g/dL — ABNORMAL LOW (ref 13.0–17.0)
MCH: 31.2 pg (ref 26.0–34.0)
MCHC: 33.4 g/dL (ref 30.0–36.0)
MCV: 93.2 fL (ref 80.0–100.0)
Platelets: 267 10*3/uL (ref 150–400)
RBC: 3.11 MIL/uL — ABNORMAL LOW (ref 4.22–5.81)
RDW: 14.8 % (ref 11.5–15.5)
WBC: 9.4 10*3/uL (ref 4.0–10.5)
nRBC: 0 % (ref 0.0–0.2)

## 2022-01-18 LAB — PROTIME-INR
INR: 2.1 — ABNORMAL HIGH (ref 0.8–1.2)
Prothrombin Time: 23.4 seconds — ABNORMAL HIGH (ref 11.4–15.2)

## 2022-01-18 MED ORDER — WARFARIN SODIUM 2.5 MG PO TABS
2.5000 mg | ORAL_TABLET | Freq: Once | ORAL | Status: AC
Start: 2022-01-18 — End: 2022-01-18
  Administered 2022-01-18: 2.5 mg via ORAL
  Filled 2022-01-18: qty 1

## 2022-01-18 MED ORDER — MIRABEGRON ER 25 MG PO TB24
25.0000 mg | ORAL_TABLET | Freq: Every day | ORAL | Status: DC
  Administered 2022-01-18 – 2022-01-20 (×3): 25 mg via ORAL
  Filled 2022-01-18 (×3): qty 1

## 2022-01-18 MED ORDER — IOHEXOL 300 MG/ML  SOLN
100.0000 mL | Freq: Once | INTRAMUSCULAR | Status: AC | PRN
  Administered 2022-01-18: 100 mL via INTRAVENOUS

## 2022-01-18 MED ORDER — WARFARIN - PHARMACIST DOSING INPATIENT: Freq: Every day | Status: DC

## 2022-01-18 MED ORDER — IOHEXOL 300 MG/ML  SOLN
50.0000 mL | Freq: Once | INTRAMUSCULAR | Status: AC | PRN
  Administered 2022-01-18: 50 mL

## 2022-01-18 NOTE — Progress Notes (Signed)
Pt arrived to unit from CT

## 2022-01-18 NOTE — Progress Notes (Signed)
Pt left off unit to CT

## 2022-01-18 NOTE — Progress Notes (Signed)
Urology Inpatient Progress Report  Acute cholecystitis [K81.0]  Procedure(s): CYSTOSCOPY EXPLORATORY LAPAROTOMY WITH EXTRAPERITONEAL BLADDER REPAIR  14 Days Post-Op   Intv/Subj: No acute events overnight. Patient is without complaint. CT scan pending  Principal Problem:   Acute cholecystitis Active Problems:   Acute respiratory failure with hypoxia (HCC)   Encounter for biliary drainage tube placement   Hematuria   Extraperitoneal rupture of bladder   Leukocytosis   Pressure injury of skin  Current Facility-Administered Medications  Medication Dose Route Frequency Provider Last Rate Last Admin   0.45 % sodium chloride infusion   Intravenous Continuous Janith Lima, MD 75 mL/hr at 01/17/22 1707 New Bag at 01/17/22 1707   acetaminophen (TYLENOL) tablet 650 mg  650 mg Oral Q6H PRN Kaleen Mask, RPH   650 mg at 01/17/22 2119   albuterol (PROVENTIL) (2.5 MG/3ML) 0.083% nebulizer solution 2.5 mg  2.5 mg Nebulization Q6H PRN Linus Galas, MD       atorvastatin (LIPITOR) tablet 40 mg  40 mg Oral Daily Rehman, Areeg N, DO   40 mg at 01/17/22 1020   carvedilol (COREG) tablet 6.25 mg  6.25 mg Oral BID WC Lacinda Axon, MD   6.25 mg at 01/17/22 1020   Chlorhexidine Gluconate Cloth 2 % PADS 6 each  6 each Topical Daily Icard, Bradley L, DO   6 each at 01/17/22 1020   docusate sodium (COLACE) capsule 100 mg  100 mg Oral BID PRN Jardin, Carla G, RPH       enoxaparin (LOVENOX) injection 40 mg  40 mg Subcutaneous Q24H Linus Galas, MD   40 mg at 01/17/22 1222   feeding supplement (ENSURE ENLIVE / ENSURE PLUS) liquid 237 mL  237 mL Oral TID BM Mannam, Praveen, MD   237 mL at 01/17/22 1020   finasteride (PROSCAR) tablet 5 mg  5 mg Oral Daily [name], MD   5 mg at 01/17/22 1020   Gerhardt's butt cream   Topical BID Mannam, Hart Robinsons, MD   Given at 01/17/22 2120   lip balm (CARMEX) ointment   Topical PRN Candee Furbish, MD       multivitamin with minerals  tablet 1 tablet  1 tablet Oral Daily Mannam, Praveen, MD   1 tablet at 01/17/22 1020   ondansetron (ZOFRAN) injection 4 mg  4 mg Intravenous Q6H PRN Icard, Octavio Graves, DO       Oral care mouth rinse  15 mL Mouth Rinse PRN Candee Furbish, MD       piperacillin-tazobactam (ZOSYN) IVPB 3.375 g  3.375 g Intravenous Q8H Axel Filler, MD 12.5 mL/hr at 01/18/22 0012 3.375 g at 01/18/22 0012   polyethylene glycol (MIRALAX / GLYCOLAX) packet 17 g  17 g Oral Daily PRN Jennelle Human B, NP       senna-docusate (Senokot-S) tablet 1 tablet  1 tablet Oral QHS Kaleen Mask, RPH   1 tablet at 01/17/22 2120     Objective: Vital: Vitals:   01/17/22 0808 01/17/22 1714 01/18/22 0448 01/18/22 0500  BP: 134/64 137/61 (!) 121/105   Pulse: 92 86 86   Resp: '18 18 15   '$ Temp: 97.6 F (36.4 C) (!) 97.5 F (36.4 C) 98.1 F (36.7 C)   TempSrc: Oral Oral Oral   SpO2: 97% 99% 99%   Weight:    77.3 kg  Height:       I/Os: I/O last 3 completed shifts: In: 74 [P.O.:60; I.V.:24.6; IV Piggyback:3.4] Out: 900 [Urine:700;  Drains:200]  Physical Exam:  General: Patient is in no apparent distress Lungs: Normal respiratory effort, chest expands symmetrically. GI: The abdomen is soft and nontender without mass.  Incision has dehisced.  Underlying fascia appears intact. Foley: Draining clear yellow urine Ext: lower extremities symmetric  Lab Results: Recent Labs    01/16/22 0601 01/17/22 0425 01/18/22 0140  WBC 12.6* 11.3* 9.4  HGB 10.3* 10.0* 9.7*  HCT 30.7* 30.6* 29.0*   Recent Labs    01/16/22 0601 01/17/22 0425 01/18/22 0140  NA 138 140 137  K 3.5 3.7 3.7  CL 104 105 104  CO2 '24 25 26  '$ GLUCOSE 95 96 95  BUN '21 17 16  '$ CREATININE 1.24 1.14 1.29*  CALCIUM 8.1* 8.0* 7.7*   Recent Labs    01/16/22 0601 01/17/22 0425 01/18/22 0140  INR 1.4* 1.7* 2.1*   No results for input(s): "LABURIN" in the last 72 hours. Results for orders placed or performed during the hospital encounter of  12/28/21  Culture, blood (routine x 2)     Status: None   Collection Time: 12/28/21  9:53 AM   Specimen: BLOOD RIGHT ARM  Result Value Ref Range Status   Specimen Description BLOOD RIGHT ARM  Final   Special Requests   Final    BOTTLES DRAWN AEROBIC AND ANAEROBIC Blood Culture adequate volume   Culture   Final    NO GROWTH 5 DAYS Performed at [facility], [place] [address], [place], [place] [address]    Report Status 01/02/2022 FINAL  Final  Culture, blood (routine x 2)     Status: None   Collection Time: 12/28/21 10:05 AM   Specimen: BLOOD RIGHT HAND  Result Value Ref Range Status   Specimen Description BLOOD RIGHT HAND  Final   Special Requests   Final    BOTTLES DRAWN AEROBIC AND ANAEROBIC Blood Culture results may not be optimal due to an inadequate volume of blood received in culture bottles   Culture   Final    NO GROWTH 5 DAYS Performed at [facility], [place] [address] [place], [place] [address]    Report Status 01/02/2022 FINAL  Final  MRSA Next Gen by PCR, Nasal     Status: None   Collection Time: 12/28/21  1:00 PM   Specimen: Nasal Mucosa; Nasal Swab  Result Value Ref Range Status   MRSA by PCR Next Gen NOT DETECTED NOT DETECTED Final    Comment: (NOTE) The GeneXpert MRSA Assay (FDA approved for NASAL specimens only), is one component of a comprehensive MRSA colonization surveillance program. It is not intended to diagnose MRSA infection nor to guide or monitor treatment for MRSA infections. Test performance is not FDA approved in patients less than 68 years old. Performed at [facility], [place] [address], [place], [place] [address]   Aerobic/Anaerobic Culture w Gram Stain (surgical/deep wound)     Status: None   Collection Time: 12/29/21 10:23 AM   Specimen: BILE  Result Value Ref Range Status   Specimen Description BILE  Final   Special Requests Normal  Final   Gram Stain   Final    FEW WBC PRESENT,BOTH PMN AND MONONUCLEAR ABUNDANT GRAM  NEGATIVE RODS    Culture   Final    ABUNDANT ESCHERICHIA COLI NO ANAEROBES ISOLATED Performed at [facility], [place] [address], [place], [place] [address]    Report Status 01/03/2022 FINAL  Final   Organism ID, Bacteria ESCHERICHIA COLI  Final      Susceptibility   Escherichia coli - MIC*    AMPICILLIN 4 SENSITIVE Sensitive     CEFAZOLIN <=4 SENSITIVE Sensitive     CEFEPIME <=0.12 SENSITIVE Sensitive     CEFTAZIDIME <=1 SENSITIVE Sensitive     CEFTRIAXONE <=0.25 SENSITIVE Sensitive     CIPROFLOXACIN <=0.25 SENSITIVE Sensitive     GENTAMICIN <=1 SENSITIVE Sensitive     IMIPENEM <=0.25 SENSITIVE Sensitive     TRIMETH/SULFA <=20 SENSITIVE Sensitive     AMPICILLIN/SULBACTAM 4 SENSITIVE Sensitive     PIP/TAZO <=4 SENSITIVE Sensitive     * ABUNDANT ESCHERICHIA COLI  Culture, Respiratory w Gram Stain     Status: None   Collection Time: 01/06/22 12:59 PM   Specimen: Tracheal Aspirate; Respiratory  Result Value Ref Range Status   Specimen Description TRACHEAL ASPIRATE  Final   Special Requests NONE  Final   Gram Stain   Final    ABUNDANT WBC PRESENT, PREDOMINANTLY PMN RARE BUDDING YEAST SEEN RARE GRAM NEGATIVE RODS Performed at [facility], [place] [address], [place], [place] [address]    Culture FEW CANDIDA ALBICANS RARE STAPHYLOCOCCUS AUREUS   Final   Report Status 01/10/2022 FINAL  Final   Organism ID, Bacteria STAPHYLOCOCCUS AUREUS  Final      Susceptibility   Staphylococcus aureus - MIC*    CIPROFLOXACIN <=0.5 SENSITIVE Sensitive     ERYTHROMYCIN 0.5 SENSITIVE Sensitive     GENTAMICIN <=0.5 SENSITIVE Sensitive     OXACILLIN 0.5 SENSITIVE Sensitive     TETRACYCLINE 2 SENSITIVE Sensitive     VANCOMYCIN 1 SENSITIVE Sensitive     TRIMETH/SULFA <=10 SENSITIVE Sensitive     CLINDAMYCIN <=0.25 SENSITIVE Sensitive     RIFAMPIN <=0.5 SENSITIVE Sensitive     Inducible Clindamycin NEGATIVE Sensitive     * RARE STAPHYLOCOCCUS AUREUS  Culture, blood (Routine X 2) w Reflex  to ID Panel     Status: None   Collection Time: 01/06/22  4:59 PM   Specimen: BLOOD  Result Value Ref Range Status   Specimen Description BLOOD RIGHT ANTECUBITAL  Final   Special Requests   Final    BOTTLES DRAWN AEROBIC ONLY Blood Culture results may not be optimal due to an inadequate volume of blood received in culture bottles   Culture   Final    NO GROWTH 5 DAYS Performed at [facility], [place] [address], [place], [place] [address]    Report Status 01/11/2022 FINAL  Final  Culture, blood (Routine X 2) w Reflex to ID Panel     Status: None   Collection Time: 01/06/22  5:03 PM   Specimen: BLOOD RIGHT FOREARM  Result Value Ref Range Status   Specimen Description BLOOD RIGHT FOREARM  Final   Special Requests   Final    BOTTLES DRAWN AEROBIC AND ANAEROBIC Blood Culture results may not be optimal due to an inadequate volume of blood received in culture bottles   Culture   Final    NO GROWTH 5 DAYS Performed at [facility], [place] [address], [place],  [address]    Report Status 01/11/2022 FINAL  Final    Studies/Results: DG Swallowing Func-Speech Pathology  Result Date: 01/17/2022 Table formatting from the original result was not included. Objective Swallowing Evaluation: Type of Study: MBS-Modified Barium Swallow Study  Patient Details Name: [name] MRN: [id number] Date of Birth: January 31, 1946 Today's Date: 01/17/2022 Time: SLP Start Time (ACUTE ONLY): 7782 -SLP  Stop Time (ACUTE ONLY): 0940 SLP Time Calculation (min) (ACUTE ONLY): 20 min Past Medical History: Past Medical History: Diagnosis Date  Allergic rhinitis 08/08/2013  takes Loratadine daily   Aortic stenosis 09/08/2011  With mild aortic regurgitation, mean gradient 13 mmHg   Cataract   right but immature  Complication of anesthesia   stopped breathing - during shoulder surgery 2009-2010  Coronary artery disease 10/02/2009  Cardiac cath (October 2007): 20% left main, 60% mid LAD, 60-70% distal LAD, 30-40% first  diagonal, 30% circumflex, 40% OM, 30-40% RCA   Degenerative joint disease of shoulder 08/03/2013  Bilateral, s/p right total shoulder arthroplasty 05/29/2011 and left shoulder arthroplasty 51/88/4166  Diastolic dysfunction 12/01/158  Echo (04/04/2016): Grade I  Diverticulosis 10/07/2013  Seen on colonoscopy in 2007   Erectile dysfunction 05/07/2009  Essential hypertension 08/03/2013  had been on Lisinopril but stopped per MD  First degree AV block 03/14/2016  Gastric ulcer 10/07/2013  Seen on EGD 04/10/2006   Hemorrhoids 10/07/2013  Hyperlipidemia 10/02/2009  Paroxysmal atrial fibrillation ([place]) 07/14/2006  One episode, provoked by alcohol use disorder, briefly anticoagulated with warfarin, then switched to aspirin alone  Sciatica associated with disorder of lumbar spine 08/03/2013  Anterolisthesis with right L 4-5 nerve root compression.  Treated with epidural injections and Physical Therapy.   Seborrheic keratosis 10/07/2013  Tubular adenoma of colon  Past Surgical History: Past Surgical History: Procedure Laterality Date  CARDIAC CATHETERIZATION  2007  CARDIOVERSION    CARDIOVERSION N/A 08/04/2019  Procedure: CARDIOVERSION;  Surgeon: Fay Records, MD;  Location: [place];  Service: Cardiovascular;  Laterality: N/A;  COLONOSCOPY    CYSTOSCOPY N/A 01/04/2022  Procedure: Consuela Mimes;  Surgeon: Raynelle Bring, MD;  Location: [place];  Service: Urology;  Laterality: N/A;  INSERTION OF MESH N/A 03/11/2019  Procedure: Insertion Of Mesh;  Surgeon: Ralene Ok, MD;  Location: [place];  Service: General;  Laterality: N/A;  IR PERC CHOLECYSTOSTOMY  12/29/2021  JOINT REPLACEMENT    KNEE ARTHROSCOPY WITH MENISCAL REPAIR Right 01/30/2011  LAPAROTOMY N/A 01/04/2022  Procedure: EXPLORATORY LAPAROTOMY WITH EXTRAPERITONEAL BLADDER REPAIR;  Surgeon: Raynelle Bring, MD;  Location: [place];  Service: Urology;  Laterality: N/A;  right finger surgery    as a child  TONSILLECTOMY    TOTAL KNEE ARTHROPLASTY Left 07/28/2017  Procedure: LEFT TOTAL KNEE  ARTHROPLASTY;  Surgeon: Renette Butters, MD;  Location: [place];  Service: Orthopedics;  Laterality: Left;  TOTAL SHOULDER ARTHROPLASTY  05/29/2011  Procedure: TOTAL SHOULDER ARTHROPLASTY;  Surgeon: Metta Clines Supple;  Location: [place];  Service: Orthopedics;  Laterality: Right;  TOTAL SHOULDER ARTHROPLASTY Left 06/08/2014  DR SUPPLE  TOTAL SHOULDER ARTHROPLASTY Left 06/08/2014  Procedure: LEFT TOTAL SHOULDER ARTHROPLASTY;  Surgeon: Marin Shutter, MD;  Location: [place];  Service: Orthopedics;  Laterality: Left;  UMBILICAL HERNIA REPAIR N/A 03/11/2019  Procedure: LAPAROSCOPIC UMBILICAL HERNIA;  Surgeon: Ralene Ok, MD;  Location: [facility] [place];  Service: General;  Laterality: N/A; HPI: 76 yo admitted 7/1 with malaise, Abd pain and cholecystitis. 7/2 IR placed perc chole drain with intubation same date and sepsis. 7/8 pt with extraperitoneal bladder rupture to OR for ex lap, cystotomy with bladder repair. Intubated 7/2-7/14 PMHx: HLD, CAD, AS, HTN, gout, Afib  No data recorded  Recommendations for follow up therapy are one component of a multi-disciplinary discharge planning process, led by the attending physician.  Recommendations may be updated based on patient status, additional functional criteria and insurance authorization. Assessment / Plan / Recommendation   01/17/2022  10:20 AM  Clinical Impressions Clinical Impression Pt presents with oropharyngeal dysphagia characterized by reduction in bolus cohesion, tongue base retraction, and pharyngeal constriction. He demonstrated vallecular residue, and posterior pharyngeal wall residue. Residue was improved with reduced bolus sizes and with a liquid wash. Penetration (PAS 5) and aspiration (PAS 7, 8) were noted with thin liquids. Throat clearing and delayed coughing were inconsistently noted. Prompted coughing did propel the aspirate above the vocal folds, but was ineffective in expelling it from the larynx and it was subsequently aspirated again. Penetration and aspiration  were eliminated with use of a chin tuck posture and avoidance of straws. A regular texture diet with thin liquids is recommended at this time with strict observance of swallowing precautions. SLP will follow for dysphagia treatment. SLP Visit Diagnosis Dysphagia, oropharyngeal phase (R13.12) Impact on safety and function Mild aspiration risk     01/17/2022  10:20 AM Treatment Recommendations Treatment Recommendations Therapy as outlined in treatment plan below     01/17/2022  10:20 AM Prognosis Prognosis for Safe Diet Advancement Good   01/17/2022  10:20 AM Diet Recommendations SLP Diet Recommendations Regular solids;Thin liquid Liquid Administration via Cup;No straw Medication Administration Whole meds with puree Compensations Slow rate;Small sips/bites;Follow solids with liquid;Chin tuck Postural Changes Seated upright at 90 degrees     01/17/2022  10:20 AM Other Recommendations Oral Care Recommendations Oral care BID Follow Up Recommendations Skilled nursing-short term rehab (<3 hours/day) Functional Status Assessment Patient has had a recent decline in their functional status and demonstrates the ability to make significant improvements in function in a reasonable and predictable amount of time.   01/17/2022  10:20 AM Frequency and Duration  Speech Therapy Frequency (ACUTE ONLY) min 2x/week Treatment Duration 2 weeks     01/17/2022  10:20 AM Oral Phase Oral Phase Impaired Oral - Nectar Cup Decreased bolus cohesion;Premature spillage Oral - Nectar Straw Decreased bolus cohesion;Premature spillage Oral - Thin Cup Decreased bolus cohesion;Premature spillage Oral - Thin Straw Decreased bolus cohesion;Premature spillage Oral - Puree WFL Oral - Regular [facility]    01/17/2022  10:20 AM Pharyngeal Phase Pharyngeal Phase Impaired Pharyngeal- Nectar Cup Reduced tongue base retraction;Pharyngeal residue - valleculae;Pharyngeal residue - posterior pharnyx;Reduced pharyngeal peristalsis Pharyngeal- Nectar Straw Reduced tongue base  retraction;Pharyngeal residue - valleculae;Pharyngeal residue - posterior pharnyx;Reduced pharyngeal peristalsis Pharyngeal- Thin Cup Reduced tongue base retraction;Pharyngeal residue - valleculae;Pharyngeal residue - posterior pharnyx;Reduced pharyngeal peristalsis;Penetration/Aspiration before swallow;Penetration/Aspiration during swallow Pharyngeal Material enters airway, CONTACTS cords and not ejected out;Material enters airway, passes BELOW cords and not ejected out despite cough attempt by patient;Material enters airway, passes BELOW cords without attempt by patient to eject out (silent aspiration) Pharyngeal- Thin Straw Reduced tongue base retraction;Pharyngeal residue - valleculae;Pharyngeal residue - posterior pharnyx;Reduced pharyngeal peristalsis;Penetration/Aspiration during swallow;Penetration/Aspiration before swallow Pharyngeal Material enters airway, CONTACTS cords and not ejected out;Material enters airway, passes BELOW cords without attempt by patient to eject out (silent aspiration);Material enters airway, passes BELOW cords and not ejected out despite cough attempt by patient Pharyngeal- Puree Reduced tongue base retraction;Pharyngeal residue - valleculae;Pharyngeal residue - posterior pharnyx;Reduced pharyngeal peristalsis Pharyngeal- Regular Reduced tongue base retraction;Pharyngeal residue - valleculae;Pharyngeal residue - posterior pharnyx;Reduced pharyngeal peristalsis Pharyngeal- Pill Reduced tongue base retraction;Pharyngeal residue - valleculae;Pharyngeal residue - posterior pharnyx;Reduced pharyngeal peristalsis    01/17/2022  10:20 AM Cervical Esophageal Phase  Cervical Esophageal Phase [facility] Shanika I. Hardin Negus, [place], [place] Office number [phone] Pager [place] 01/17/2022, 10:32 AM  Assessment: Bladder rupture Wound dehiscence  Procedure(s): CYSTOSCOPY EXPLORATORY LAPAROTOMY WITH EXTRAPERITONEAL BLADDER  REPAIR, 14 Days Post-Op  doing well.  Plan: CT of the abdomen and pelvis with IV contrast and CT cystogram pending.  If there is evidence of fascial dehiscence, he will need a return to the operating room.  On examination he does not have obvious fascial dehiscence.  If this is confirmed, he will need wound care consultation.  Maintain Foley catheter for now.   Link Snuffer, MD Urology 01/18/2022, 5:33 AM

## 2022-01-18 NOTE — Progress Notes (Signed)
ANTICOAGULATION CONSULT NOTE - Follow Up Consult  Pharmacy Consult for Warfarin Indication: atrial fibrillation  No Known Allergies  Patient Measurements: Height: '5\' 6"'$  (167.6 cm) Weight: 77.3 kg (170 lb 6.7 oz) IBW/kg (Calculated) : 63.8  Vital Signs: Temp: 97.4 F (36.3 C) (07/22 0908) Temp Source: Oral (07/22 0908) BP: 134/57 (07/22 0908) Pulse Rate: 85 (07/22 0908)  Labs: Recent Labs    01/16/22 0601 01/17/22 0425 01/18/22 0140  HGB 10.3* 10.0* 9.7*  HCT 30.7* 30.6* 29.0*  PLT 305 304 267  LABPROT 17.2* 19.4* 23.4*  INR 1.4* 1.7* 2.1*  CREATININE 1.24 1.14 1.29*    Estimated Creatinine Clearance: 49.2 mL/min (A) (by C-G formula based on SCr of 1.29 mg/dL (H)).   Medications:  Medications Prior to Admission  Medication Sig Dispense Refill Last Dose   allopurinol (ZYLOPRIM) 100 MG tablet TAKE 2 TABLETS EVERY DAY (DOSE INCREASE) (Patient taking differently: Take 200 mg by mouth daily.) 180 tablet 3 12/27/2021   atorvastatin (LIPITOR) 40 MG tablet TAKE 1 TABLET EVERY DAY (Patient taking differently: Take 40 mg by mouth daily.) 90 tablet 3 12/27/2021   carvedilol (COREG) 6.25 MG tablet TAKE 1 TABLET TWICE DAILY WITH MEALS (NEED MD APPOINTMENT) (Patient taking differently: Take 6.25 mg by mouth 2 (two) times daily with a meal.) 180 tablet 3 12/28/2021 at 07:30   furosemide (LASIX) 20 MG tablet TAKE 1 TABLET EVERY DAY (Patient taking differently: Take 20 mg by mouth daily.) 90 tablet 3 12/27/2021   losartan (COZAAR) 50 MG tablet TAKE 1 TABLET EVERY DAY (Patient taking differently: Take 50 mg by mouth daily.) 90 tablet 3 12/28/2021   naproxen sodium (ALEVE) 220 MG tablet Take 440 mg by mouth daily as needed (pain).   12/27/2021   warfarin (COUMADIN) 5 MG tablet Take 1-2 tablets by mouth daily or as directed by Coumadin clinic (Patient taking differently: Take 2.5-5 mg by mouth daily. 2.5 mg daily on Sunday, Tuesday, Thursday, Saturday 5 mg daily on Monday, Wednesday, Friday) 90  tablet 0 12/28/2021 at 07:30   Scheduled:   atorvastatin  40 mg Oral Daily   carvedilol  6.25 mg Oral BID WC   Chlorhexidine Gluconate Cloth  6 each Topical Daily   feeding supplement  237 mL Oral TID BM   finasteride  5 mg Oral Daily   Gerhardt's butt cream   Topical BID   mirabegron ER  25 mg Oral Daily   multivitamin with minerals  1 tablet Oral Daily   senna-docusate  1 tablet Oral QHS   Infusions:   sodium chloride 75 mL/hr at 01/18/22 0723   PRN: acetaminophen, albuterol, docusate sodium, lip balm, ondansetron (ZOFRAN) IV, mouth rinse, polyethylene glycol  Assessment: 76 yo M with history of Afib on warfarin PTA. Admit INR was 7.3 s/p IV Vit K '10mg'$  on 7/1 and admitted with cholecysititis now s/p percutaneous drain placement. Pt's bladder perforated on 7/7 and is now s/p surgical repair on 7/8 and has been on continuous bladder irrigation (CBI) through 7/12. CBI d/c'd and urology ok with resuming anticoagulation but hematuria increased and IV heparin stopped 7/16. Lovenox for VTE prophylaxis started. Urology obtained imaging to determine if additional surgical interventions were needed. No other plans for surgery at this time, so lovenox 40 mg was discontinued and pharmacy consulted to resume warfarin for Afib.   INR 2.1 after '5mg'$  x1 on 7/20. H/H, plt stable. Home dose 2.'5mg'$  daily except '5mg'$  MWF  Goal of Therapy:  INR 2-3 Monitor platelets by anticoagulation  protocol: Yes   Plan:  Warfarin 2.5 mg today per his PTA schedule F/u INR and CBC daily CTM for s/sx of bleeding   Varney Daily, PharmD PGY1 Pharmacy Resident  Please check AMION for all [facility] pharmacy phone numbers After 10:00 PM call main pharmacy [phone]

## 2022-01-18 NOTE — Progress Notes (Signed)
Subjective:   Summary: [name] is a 76 y.o. year old male currently admitted on the IMTS HD#21 for septic shock secondary to acute cholecystitis and respiratory failure.  Overnight Events: - Patient with improved mental status this morning though he still endorses confusion - He still denies any abdominal pain, fevers, nausea, vomiting, chest pain or trouble breathing - Pending discharge planning to SNF  Objective:  Vital signs in last 24 hours: Vitals:   01/17/22 1714 01/18/22 0448 01/18/22 0500 01/18/22 0908  BP: 137/61 (!) 121/105  (!) 134/57  Pulse: 86 86  85  Resp: '18 15  18  '$ Temp: (!) 97.5 F (36.4 C) 98.1 F (36.7 C)  (!) 97.4 F (36.3 C)  TempSrc: Oral Oral  Oral  SpO2: 99% 99%  97%  Weight:   77.3 kg   Height:      Remains afebrile Supplemental O2: 97 to 99% on room air Hemodynamically stable overnight Physical Exam:  Constitutional: Chronically ill-appearing elderly man lying in bed in no acute distress.  Pleasant and responsive to questions. Cardiovascular: Nontachycardic and irregular rhythm, 2 out of 6 systolic murmur in the upper sternal border  pulmonary/Chest: Normal work of breathing on room air, lung sounds absent in bilateral lung bases, no crackles or wheezing abdominal: soft, non-tender, non-distended.  Percutaneous biliary drain with bilious drainage similar to yesterday.  Urinary catheter draining clear urine.  Lower abdominal incision covered with bandage and packed with wet-to-dry dressings, minimal tenderness to palpation around wound Skin: Cold and dry Extremities: No lower extremity edema noted   Filed Weights   01/14/22 0355 01/17/22 0436 01/18/22 0500  Weight: 82.1 kg 78.9 kg 77.3 kg     Intake/Output Summary (Last 24 hours) at 01/18/2022 1111 Last data filed at 01/18/2022 0657 Gross per 24 hour  Intake 976.79 ml  Output 450 ml  Net 526.79 ml   Net IO Since Admission: -44,896.7 mL [01/18/22 1111]  Biliary  drain output total output 175 cc over last 24 hours Pertinent Labs:    Latest Ref Rng & Units 01/18/2022    1:40 AM 01/17/2022    4:25 AM 01/16/2022    6:01 AM  CBC  WBC 4.0 - 10.5 K/uL 9.4  11.3  12.6   Hemoglobin 13.0 - 17.0 g/dL 9.7  10.0  10.3   Hematocrit 39.0 - 52.0 % 29.0  30.6  30.7   Platelets 150 - 400 K/uL 267  304  305        Latest Ref Rng & Units 01/18/2022    1:40 AM 01/17/2022    4:25 AM 01/16/2022    6:01 AM  CMP  Glucose 70 - 99 mg/dL 95  96  95   BUN 8 - 23 mg/dL '16  17  21   '$ Creatinine 0.61 - 1.24 mg/dL 1.29  1.14  1.24   Sodium 135 - 145 mmol/L 137  140  138   Potassium 3.5 - 5.1 mmol/L 3.7  3.7  3.5   Chloride 98 - 111 mmol/L 104  105  104   CO2 22 - 32 mmol/L '26  25  24   '$ Calcium 8.9 - 10.3 mg/dL 7.7  8.0  8.1    INR 2.1 Imaging: Interpretation as follows: CT cystogram abdomen pelvis (01/18/2022): Showing contrast extravasation from the anterior bladder wall.   Assessment/Plan:   Principal Problem:   Acute cholecystitis Active Problems:  Acute respiratory failure with hypoxia (HCC)   Encounter for biliary drainage tube placement   Hematuria   Extraperitoneal rupture of bladder   Leukocytosis   Pressure injury of skin   Patient Summary: [name] is a 76 y.o. male with hx of CAD, aortic stenosis, atrial fibrillation who presented with nausea, vomiting, abdominal pain, dyspnea and admitted for septic shock secondary to acute cholecystitis with associated respiratory failure.  He was initially intubated with percutaneous biliary drain placed with subsequent bladder rupture requiring surgical repair on July 8.  Extubated on July 13 and transferred to MTS on July 15.  Symptoms resolved with no signs of acute infection, patient stable without need to return to the OR per urology.   #Perforated bladder status post surgical repair #Gross hematuria Patient was noted to have worsening renal function with hematuria noted on 7/4 status post in and out  cath. Found to have a ruptured bladder on CT imaging 7/7 and was taken to the OR for an exploratory laparotomy and bladder repair. Pelvic drain was discontinued on 7/14.  Hemoglobin 9.7 this morning, no gross hematuria.  CT abdomen pelvis cystogram this morning noted to have anterior bladder wall contrast extravasation, urology would like to treat conservatively with wound care and maintain Foley. - Urology following, recommended to continue Foley and twice daily wet to dry dressing changes.  No wound VAC recommended as this can cause permanent fistula.  No plans for operative management at this point - Continue finasteride - Foley in place.  Closely monitor UOP --Trend CBC  #Sepsis secondary to acute cholecystitis s/p percutaneous drain placement, resolved Leukocytosis resolved at 9.7 today, patient remains afebrile. IR recommends drain remain in place for 6 weeks with IR follow-up and possible removal during surgery. -IR is following, recs as above -Continue 3 times daily flushes with 5 cc normal saline -Follow drain output every shift -General surgery signed off 7/10, recommended outpatient follow-up -Trend fever curve and WBC  #Acute hypoxic respiratory failure Satting well overnight on room air.  Denies any dyspnea at rest on room air, mentions he is able to ambulate but sometimes needs oxygen. Ambulation to yesterday revealed need for 4 L nasal cannula upon exertion.  If patient has worsening respiratory status and appears volume overloaded, will consider resuming diuresis in the future. -Continue pulmonary hygiene - Albuterol nebs every 6 as needed -We will attempt to remain on room air at rest with SPO2 goal greater than 92%.  Up to 4 L nasal cannula as needed while ambulating.  #Acute metabolic encephalopathy, resolved  Patient had ICU/septic delirium which improved. He was receiving Seroquel and Klonopin which was discontinued 7/14. Patient able to answer all orientation questions  appropriately.  Mild confusion yesterday, alert and oriented this morning. -Delirium precautions  Permanent Valvular A-fib on Coumadin Supra therapeutic INR, resolved   Patient noted to be in A-fib but never in RVR on home Coreg 6.25 twice daily.  INR therapeutic this morning, though Coumadin held yesterday.  We will continue Coumadin because patient is not going to the OR - Continue home Coreg 6.25 mg twice daily - Coumadin per pharmacy consult.  Currently therapeutic level.  Hypertension CAD Hyperlipidemia Moderate aortic valve stenosis Home medications include carvedilol, losartan furosemide and atorvastatin.  Blood pressures well managed while still holding home losartan and Lasix.  Patient is euvolemic today, no diuresis needed. - Coreg 6.25 mg BID as above, hold others. --Continue atorvastatin 40 mg daily   AKI: Creatinine 1.29 today, which  is within his baseline range.  Volume status today reassuring, no diuresis planned. Trend BMP. Hypokalemia: Resolved Gout: Holding home allopurinol for now.  Consider restarting on discharge but might need to cover for acute flare.  Diet: Normal IVF: None VTE: Coumadin per pharmacy  code: Full code PT/OT recs: PT/OT recommended CIR however patient's insurance did not cover this, now pending SNF placement.   TOC recs: SNF placement Family Update:   Dispo: Patient medically stable pending physician to SNF and wound care  Linus Galas, MD PGY-1 Internal Medicine Resident Please contact the on call pager after 5 pm and on weekends at [phone].

## 2022-01-18 NOTE — TOC Progression Note (Addendum)
Transition of Care [facility]) - Progression Note    Patient Details  Name: [name] MRN: [id number] Date of Birth: Aug 12, 1945  Transition of Care [facility]) CM/SW [place], [place] Phone Number: 01/18/2022, 11:13 AM  Clinical Narrative:     SW informed pt potentially ready for d/c tomorrow.   SW spoke with Tammy ([facility] [phone]) confirmed pt is on weekend admissions list.    Expected Discharge Plan: Long Term Acute Care (LTAC) Barriers to Discharge: Continued Medical Work up, Requiring sitter/restraints, Orthoptist and Services Expected Discharge Plan: [place] (LTAC)       Living arrangements for the past 2 months: Single Family Home                                       Social Determinants of Health (SDOH) Interventions    Readmission Risk Interventions     No data to display

## 2022-01-18 NOTE — Progress Notes (Signed)
CT scan reviewed.  Fascia appears intact.  No bowel dehiscence.  However, he does have a extraperitoneal bladder leak with communication to the incision.  Do not recommend any surgical intervention at this time.  Continue twice daily wet-to-dry dressings by nursing.  Do not recommend wound VAC.  Continue Foley catheter.  Hopefully this will resolve with time.

## 2022-01-18 NOTE — TOC Progression Note (Addendum)
Transition of Care [facility]) - Progression Note    Patient Details  Name: [name] MRN: [id number] Date of Birth: Jul 06, 1945  Transition of Care [facility] [place]) CM/SW Contact  Bartholomew Crews, RN Phone Number: [phone] 01/18/2022, 4:22 PM  Clinical Narrative:     Notified by MD of family wanting to bring patient home. Spoke with patient's spouse, [name], at [phone] to discuss post acute transition. Marjoline confirmed desire for patient to return home rather that go to SNF. One adult daughter lives with patient/spouse and another lives nearby. Family to transport patient home in private vehicle.   Discussed DME needs. Patient has standard walker, but needs RW and 3-N-1. Referral sent to [facility] for delivery to the room. Patient will need DME orders.   Discussed HH needs for PT/OT/RN. Offered agency choice. Referral pending acceptance.   UPDATE 1652: Referral for [facility] PT/OT/RN accepted by [facility]. Bedside nursing to teach family caregiver wound care to support successful transition home. Spoke with spouse on the phone to advise of accepting agency.   Expected Discharge Plan: [place] Barriers to Discharge: Continued Medical Work up  Expected Discharge Plan and Services Expected Discharge Plan: [place] In-house Referral: Clinical Social Work Discharge Planning Services: CM Consult Post Acute Care Choice: Home Health, Durable Medical Equipment Living arrangements for the past 2 months: Single Family Home                 DME Arranged: 3-N-1, Walker rolling DME Agency: [facility] Date DME Agency Contacted: 01/18/22 Time DME Agency Contacted: [phone] Representative spoke with at DME Agency: [employer] Arranged: PT, OT, RN           Social Determinants of Health ([place]) Interventions    Readmission Risk Interventions     No data to display

## 2022-01-18 NOTE — Progress Notes (Signed)
Nurse called CT for update on ordered CT scan. Spoke with CT tech who stated they will send for transport. Pt resting in bed, foley emptied prior to transport. Double IV's intact and easy to flush. Awaiting for CT scan.

## 2022-01-19 LAB — CBC
HCT: 32.2 % — ABNORMAL LOW (ref 39.0–52.0)
Hemoglobin: 10.7 g/dL — ABNORMAL LOW (ref 13.0–17.0)
MCH: 31.2 pg (ref 26.0–34.0)
MCHC: 33.2 g/dL (ref 30.0–36.0)
MCV: 93.9 fL (ref 80.0–100.0)
Platelets: 277 10*3/uL (ref 150–400)
RBC: 3.43 MIL/uL — ABNORMAL LOW (ref 4.22–5.81)
RDW: 14.9 % (ref 11.5–15.5)
WBC: 10.2 10*3/uL (ref 4.0–10.5)
nRBC: 0 % (ref 0.0–0.2)

## 2022-01-19 LAB — BASIC METABOLIC PANEL
Anion gap: 9 (ref 5–15)
BUN: 13 mg/dL (ref 8–23)
CO2: 21 mmol/L — ABNORMAL LOW (ref 22–32)
Calcium: 7.6 mg/dL — ABNORMAL LOW (ref 8.9–10.3)
Chloride: 106 mmol/L (ref 98–111)
Creatinine, Ser: 1.11 mg/dL (ref 0.61–1.24)
GFR, Estimated: >60 mL/min (ref >60)
Glucose, Bld: 77 mg/dL (ref 70–99)
Potassium: 3.1 mmol/L — ABNORMAL LOW (ref 3.5–5.1)
Sodium: 136 mmol/L (ref 135–145)

## 2022-01-19 LAB — PROTIME-INR
INR: 2.3 — ABNORMAL HIGH (ref 0.8–1.2)
Prothrombin Time: 24.8 seconds — ABNORMAL HIGH (ref 11.4–15.2)

## 2022-01-19 MED ORDER — POTASSIUM CHLORIDE CRYS ER 20 MEQ PO TBCR
40.0000 meq | EXTENDED_RELEASE_TABLET | Freq: Two times a day (BID) | ORAL | Status: AC
Start: 2022-01-19 — End: 2022-01-19
  Administered 2022-01-19 (×2): 40 meq via ORAL
  Filled 2022-01-19 (×2): qty 2

## 2022-01-19 MED ORDER — WARFARIN SODIUM 2.5 MG PO TABS
2.5000 mg | ORAL_TABLET | Freq: Once | ORAL | Status: AC
Start: 2022-01-19 — End: 2022-01-19
  Administered 2022-01-19: 2.5 mg via ORAL
  Filled 2022-01-19: qty 1

## 2022-01-19 NOTE — Progress Notes (Signed)
RW and 3-in-1 BSC delivered to bedside.

## 2022-01-19 NOTE — Progress Notes (Signed)
Subjective:   Summary: [name] is a 76 y.o. year old male currently admitted on the IMTS HD#22 for septic shock secondary to acute cholecystitis and respiratory failure.  Overnight Events: - Discussed disposition options with patient and his family and decided that patient wants to be discharged home with home health resources.  Family can help with wound care/Foley care and percutaneous biliary drain.  Provided education on these matters. - Patient with repeat ambulation test this morning, required 2 L of oxygen to saturate 90%. - Urology saw patient this morning, recs below, amenable to discharge - Patient reporting only occasional pains around his abdominal wound with no other symptoms including fevers, nausea or vomiting, diarrhea, chest pain, or trouble breathing Objective:  Vital signs in last 24 hours: Vitals:   01/18/22 2124 01/19/22 0412 01/19/22 0417 01/19/22 0952  BP: 134/67 139/74  133/67  Pulse: 78 96  85  Resp: '16 18  20  '$ Temp: 98 F (36.7 C) 98.1 F (36.7 C)  98.2 F (36.8 C)  TempSrc: Oral Oral  Oral  SpO2: 96% 100%  99%  Weight:   77.9 kg   Height:      Remains afebrile Supplemental O2: 99 to 100% on room air Hemodynamically stable overnight Physical Exam:  Constitutional: Chronically ill-appearing elderly man lying in bed in no acute distress.  Pleasant and responsive to questions. Cardiovascular: Nontachycardic and irregular rhythm, 2 out of 6 systolic murmur in the upper sternal border  pulmonary/Chest: Normal work of breathing on room air, no crackles or wheezing abdominal: soft, non-tender, non-distended.  Percutaneous biliary drain with bilious drainage similar to yesterday.  Urinary catheter draining clear urine.  Lower abdominal incision covered with bandage and packed with wet-to-dry Kerlix, minimal tenderness to palpation around wound Skin: Cold and dry Extremities: No lower extremity edema noted   Filed Weights    01/17/22 0436 01/18/22 0500 01/19/22 0417  Weight: 78.9 kg 77.3 kg 77.9 kg     Intake/Output Summary (Last 24 hours) at 01/19/2022 1329 Last data filed at 01/19/2022 1008 Gross per 24 hour  Intake 600 ml  Output 1495 ml  Net -895 ml    Net IO Since Admission: -46,241.7 mL [01/19/22 1329]  Biliary drain output total output 195 cc over last 24 hours Pertinent Labs:    Latest Ref Rng & Units 01/19/2022    2:50 AM 01/18/2022    1:40 AM 01/17/2022    4:25 AM  CBC  WBC 4.0 - 10.5 K/uL 10.2  9.4  11.3   Hemoglobin 13.0 - 17.0 g/dL 10.7  9.7  10.0   Hematocrit 39.0 - 52.0 % 32.2  29.0  30.6   Platelets 150 - 400 K/uL 277  267  304        Latest Ref Rng & Units 01/19/2022    2:50 AM 01/18/2022    1:40 AM 01/17/2022    4:25 AM  CMP  Glucose 70 - 99 mg/dL 77  95  96   BUN 8 - 23 mg/dL '13  16  17   '$ Creatinine 0.61 - 1.24 mg/dL 1.11  1.29  1.14   Sodium 135 - 145 mmol/L 136  137  140   Potassium 3.5 - 5.1 mmol/L 3.1  3.7  3.7   Chloride 98 - 111 mmol/L 106  104  105   CO2 22 - 32 mmol/L 21  26  25  Calcium 8.9 - 10.3 mg/dL 7.6  7.7  8.0    INR 2.3 Imaging: Interpretation as follows: CT cystogram abdomen pelvis (01/18/2022): Showing contrast extravasation from the anterior bladder wall.   Assessment/Plan:   Principal Problem:   Acute cholecystitis Active Problems:   Acute respiratory failure with hypoxia (HCC)   Encounter for biliary drainage tube placement   Hematuria   Extraperitoneal rupture of bladder   Leukocytosis   Pressure injury of skin   Patient Summary: [name] is a 76 y.o. male with hx of CAD, aortic stenosis, atrial fibrillation who presented with nausea, vomiting, abdominal pain, dyspnea and admitted for septic shock secondary to acute cholecystitis with associated respiratory failure.  He was initially intubated with percutaneous biliary drain placed with subsequent bladder rupture requiring surgical repair on July 8.  Extubated on July 13 and  transferred to MTS on July 15.  Patient now stable with minimal symptoms, pending return to home with extensive home health resources.   #Perforated bladder status post surgical repair #Gross hematuria Patient was noted to have worsening renal function with hematuria noted on 7/4 status post in and out cath. Found to have a ruptured bladder on CT imaging 7/7 and was taken to the OR for an exploratory laparotomy and bladder repair. Pelvic drain was discontinued on 7/14.  Hemoglobin 9.7 this morning, no gross hematuria.  CT abdomen pelvis cystogram this morning noted to have anterior bladder wall contrast extravasation, urology would like to treat conservatively with wound care and Foley maintenance for 2 to 3 weeks until repeat cystogram. - Urology following, recommended to continue Foley and twice daily wet to dry dressing changes and they can reevaluate in 2 to 3 weeks.  No wound VAC recommended as this can cause permanent fistula.  No plans for operative management at this point - Continue finasteride - Foley in place.  Closely monitor UOP.  Will provide family with Foley care instructions --Trend CBC  #Sepsis secondary to acute cholecystitis s/p percutaneous drain placement, resolved Leukocytosis resolved at 9.7 today, patient remains afebrile. IR recommends drain remain in place for 6 weeks with IR follow-up and possible removal during surgery. -IR is following, recs as above -Continue 3 times daily flushes with 5 cc normal saline.  Provide family with drain care instructions. -Follow drain output every shift -General surgery signed off 7/10, recommended outpatient follow-up -Trend fever curve and WBC  #Acute hypoxic respiratory failure Satting well overnight on room air.  Denies any dyspnea at rest on room air.  Ambulation test today reveals patient needed 2 L of oxygen for ambulation, but not at rest.  If patient has worsening respiratory status and appears volume overloaded, will consider  resuming diuresis in the future. -Continue pulmonary hygiene - Albuterol nebs every 6 as needed -We will attempt to remain on room air at rest with SPO2 goal greater than 92%.  Up to 2 L nasal cannula as needed while ambulating.  DME Home oxygen ordered  #Acute metabolic encephalopathy, resolved  Patient had ICU/septic delirium which improved. He was receiving Seroquel and Klonopin which was discontinued 7/14. Patient able to answer all orientation questions appropriately.  Patient more alert this morning, hopefully this will continue to improve after patient returns home.  Permanent Valvular A-fib on Coumadin Supra therapeutic INR, resolved   Patient noted to be in A-fib but never in RVR on home Coreg 6.25 twice daily.  INR therapeutic this morning.  We will continue Coumadin because patient is not going to  the OR - Continue home Coreg 6.25 mg twice daily - Coumadin per pharmacy consult.  Currently therapeutic level.  Instructed family to do outpatient INR checks.  Hypertension CAD Hyperlipidemia Moderate aortic valve stenosis Home medications include carvedilol, losartan furosemide and atorvastatin.  Blood pressures well managed while still holding home losartan and Lasix.  Patient is euvolemic today, no diuresis needed. - Coreg 6.25 mg BID as above, hold others. --Continue atorvastatin 40 mg daily   AKI: Creatinine 1.1 today, which is within his baseline range.  Volume status today reassuring, no diuresis planned. Trend BMP. Hypokalemia: K 3.1 this morning, repleted Gout: Holding home allopurinol for now.  Consider restarting on discharge but might need to cover for acute flare.  Diet: Normal IVF: None VTE: Coumadin per pharmacy  code: Full code PT/OT recs: PT/OT recommended CIR however patient's insurance did not cover this, now pending SNF placement.   TOC recs: Home health PT/OT/RN, DME orders done Family Update: Discussed discharge planning with family extensively including  Foley/wound/drain education.  Dispo: Patient medically stable, can likely return home with home health resources tomorrow  Linus Galas, MD PGY-1 Internal Medicine Resident Please contact the on call pager after 5 pm and on weekends at [phone].

## 2022-01-19 NOTE — Consult Note (Signed)
[facility] Nurse Consult Note: Reason for Consult:Surgical wound dehiscence. Urology MD (Dr. Garen Lah) has ordered twice daily wound care with NS dampened gauze. NPWT is NOT indicated as it will result in a potential fistula formation. Patient is to follow up with Urology in 2-3 weeks. Wound type:Surgical complication  Pressure Injury POA: N/A Measurement:Not measured today Wound bed:per Urology note Drainage (amount, consistency, odor) Serous Periwound: intact Dressing procedure/placement/frequency: Dr. Garen Lah has recommended and ordered twice daily wound care with ND dampened gauze and I concur with that recommendation. I have added for Bedside Nursing to teach wound care to patient/family prior to discharge and to document progress. Recommend consideration of an abdominal binder if Urology agrees.  [facility] nursing team will not follow, but will remain available to this patient, the nursing and medical teams.  Please re-consult if needed.  Thank you for inviting [place] to participate in this patient's Plan of Care.  Maudie Flakes, MSN, RN, CNS, [place], Serita Grammes, [employer], [employer] phone:  [phone]

## 2022-01-19 NOTE — Progress Notes (Signed)
SATURATION QUALIFICATIONS: (This note is used to comply with regulatory documentation for home oxygen)  Patient Saturations on Room Air at Rest = 98%  Patient Saturations on Room Air while Ambulating = 83%  Patient Saturations on 2 Liters of oxygen while Ambulating = 90%  Please briefly explain why patient needs home oxygen:

## 2022-01-19 NOTE — Progress Notes (Addendum)
Mobility Specialist Criteria Algorithm Info.   01/19/22 1100  Mobility  Activity Ambulated with assistance in hallway;Transferred from bed to chair (to chair after ambulation)  Range of Motion/Exercises Active;All extremities  Level of Assistance Contact guard assist, steadying assist  Assistive Device Front wheel walker  Distance Ambulated (ft) 70 ft  Activity Response Tolerated well   Patient received in supine eager to participate in mobility. Ambulated min guard with slow steady gait. Progressing well with mobility as he was able to extend distance ambulated. Distance limited second dyspnea and fatigue. Monitored SpO2 while ambulating in which oximeter with inconsistent reading due to poor pleth. With a good signal SpO2 reading <88%, requiring 2LO2 to maintain an oxygen saturation >90%. Returned to room without complaint or incident. Was left in recliner chair with all needs met, call bell in reach.   01/19/2022 11:46 AM   , CMS, BS EXP Acute Rehabilitation Services  Phone:[phone] Office: [phone]  

## 2022-01-19 NOTE — Progress Notes (Signed)
15 Days Post-Op   Subjective/Chief Complaint:  1 - Bladder Rupture - s/p open repair of bladder rupture 01/03/22 by Alinda Money. JP Cr 1.4 7/13 (same as serum). JP removed 7/15. Urine remains clear yellow today  No acute events overnight. CT demonstrated small anterior leak into midline wound. It appears the fascia is largely intact. Catheter continues to drain well, creatinine and urine output improved this AM.   Objective: Vital signs in last 24 hours: Temp:  [97.4 F (36.3 C)-98.2 F (36.8 C)] 98.1 F (36.7 C) (07/23 0412) Pulse Rate:  [78-96] 96 (07/23 0412) Resp:  [16-18] 18 (07/23 0412) BP: (134-139)/(57-74) 139/74 (07/23 0412) SpO2:  [96 %-100 %] 100 % (07/23 0412) Weight:  [77.9 kg] 77.9 kg (07/23 0417) Last BM Date : 01/16/22  Intake/Output from previous day: 07/22 0701 - 07/23 0700 In: -  Out: 9417 [Urine:1550; Drains:195] Intake/Output this shift: Total I/O In: 120 [P.O.:120] Out: -   Stigmata of chonic disease, frail, no acute distress Some use of accessory muscles with breathing Obese abdomen, midline incision open packed with wet to dry dressing. Chole drain I place with dark green non-foul fluid.  Foley with clear urine, no clots, yellow  Lab Results:  Recent Labs    01/18/22 0140 01/19/22 0250  WBC 9.4 10.2  HGB 9.7* 10.7*  HCT 29.0* 32.2*  PLT 267 277    BMET Recent Labs    01/18/22 0140 01/19/22 0250  NA 137 136  K 3.7 3.1*  CL 104 106  CO2 26 21*  GLUCOSE 95 77  BUN 16 13  CREATININE 1.29* 1.11  CALCIUM 7.7* 7.6*    PT/INR Recent Labs    01/18/22 0140 01/19/22 0250  LABPROT 23.4* 24.8*  INR 2.1* 2.3*    ABG No results for input(s): "PHART", "HCO3" in the last 72 hours.  Invalid input(s): "PCO2", "PO2"  Studies/Results: Personally reviewed  Anti-infectives: Anti-infectives (From admission, onward)    Start     Dose/Rate Route Frequency Ordered Stop   01/17/22 1700  cefTRIAXone (ROCEPHIN) 1 g in sodium chloride 0.9 % 100 mL  IVPB  Status:  Discontinued        1 g 200 mL/hr over 30 Minutes Intravenous Every 24 hours 01/17/22 1614 01/17/22 1627   01/17/22 1700  piperacillin-tazobactam (ZOSYN) IVPB 3.375 g  Status:  Discontinued        3.375 g 12.5 mL/hr over 240 Minutes Intravenous Every 8 hours 01/17/22 1645 01/18/22 1044   01/04/22 0900  cefTRIAXone (ROCEPHIN) 1 g in sodium chloride 0.9 % 100 mL IVPB        1 g 200 mL/hr over 30 Minutes Intravenous Every 24 hours 01/04/22 0807 01/11/22 1930   01/01/22 1400  cefTRIAXone (ROCEPHIN) 2 g in sodium chloride 0.9 % 100 mL IVPB  Status:  Discontinued        2 g 200 mL/hr over 30 Minutes Intravenous Every 24 hours 01/01/22 0842 01/03/22 0755   12/28/21 1600  piperacillin-tazobactam (ZOSYN) IVPB 3.375 g  Status:  Discontinued        3.375 g 12.5 mL/hr over 240 Minutes Intravenous Every 8 hours 12/28/21 1419 01/01/22 0842   12/28/21 1600  cefOXitin (MEFOXIN) 2 g in sodium chloride 0.9 % 100 mL IVPB  Status:  Discontinued        2 g 200 mL/hr over 30 Minutes Intravenous To Radiology 12/28/21 1509 12/29/21 1437   12/28/21 1256  vancomycin variable dose per unstable renal function (pharmacist dosing)  Status:  Discontinued         Does not apply See admin instructions 12/28/21 1256 12/28/21 1419   12/28/21 1015  piperacillin-tazobactam (ZOSYN) IVPB 3.375 g        3.375 g 100 mL/hr over 30 Minutes Intravenous  Once 12/28/21 1001 12/28/21 1029   12/28/21 1015  vancomycin (VANCOREADY) IVPB 1750 mg/350 mL        1,750 mg 175 mL/hr over 120 Minutes Intravenous  Once 12/28/21 1003 12/28/21 1254       Assessment/Plan:  1 - Bladder Rupture - now s/p repair with persistent anterior leak on recent CT cystogram. - Continue foley catheter for drainage and spontaneous healing of small anterior defect 2 - Midline dehiscence - On exam and CT scan, appears fascia is largely intact.  - continue wet-to-dry dressings, changed qshift - no additional urologic intervention  anticipated.  Greatly appreciate IM team comanagment.    Florentina Addison 01/19/2022

## 2022-01-19 NOTE — Progress Notes (Signed)
ANTICOAGULATION CONSULT NOTE - Follow Up Consult  Pharmacy Consult for Warfarin Indication: atrial fibrillation  No Known Allergies  Patient Measurements: Height: '5\' 6"'$  (167.6 cm) Weight: 77.9 kg (171 lb 11.8 oz) IBW/kg (Calculated) : 63.8  Vital Signs: Temp: 98.1 F (36.7 C) (07/23 0412) Temp Source: Oral (07/23 0412) BP: 139/74 (07/23 0412) Pulse Rate: 96 (07/23 0412)  Labs: Recent Labs    01/17/22 0425 01/18/22 0140 01/19/22 0250  HGB 10.0* 9.7* 10.7*  HCT 30.6* 29.0* 32.2*  PLT 304 267 277  LABPROT 19.4* 23.4* 24.8*  INR 1.7* 2.1* 2.3*  CREATININE 1.14 1.29* 1.11     Estimated Creatinine Clearance: 57.3 mL/min (by C-G formula based on SCr of 1.11 mg/dL).   Medications:  Medications Prior to Admission  Medication Sig Dispense Refill Last Dose   allopurinol (ZYLOPRIM) 100 MG tablet TAKE 2 TABLETS EVERY DAY (DOSE INCREASE) (Patient taking differently: Take 200 mg by mouth daily.) 180 tablet 3 12/27/2021   atorvastatin (LIPITOR) 40 MG tablet TAKE 1 TABLET EVERY DAY (Patient taking differently: Take 40 mg by mouth daily.) 90 tablet 3 12/27/2021   carvedilol (COREG) 6.25 MG tablet TAKE 1 TABLET TWICE DAILY WITH MEALS (NEED MD APPOINTMENT) (Patient taking differently: Take 6.25 mg by mouth 2 (two) times daily with a meal.) 180 tablet 3 12/28/2021 at 07:30   furosemide (LASIX) 20 MG tablet TAKE 1 TABLET EVERY DAY (Patient taking differently: Take 20 mg by mouth daily.) 90 tablet 3 12/27/2021   losartan (COZAAR) 50 MG tablet TAKE 1 TABLET EVERY DAY (Patient taking differently: Take 50 mg by mouth daily.) 90 tablet 3 12/28/2021   naproxen sodium (ALEVE) 220 MG tablet Take 440 mg by mouth daily as needed (pain).   12/27/2021   warfarin (COUMADIN) 5 MG tablet Take 1-2 tablets by mouth daily or as directed by Coumadin clinic (Patient taking differently: Take 2.5-5 mg by mouth daily. 2.5 mg daily on Sunday, Tuesday, Thursday, Saturday 5 mg daily on Monday, Wednesday, Friday) 90 tablet 0  12/28/2021 at 07:30   Scheduled:   atorvastatin  40 mg Oral Daily   carvedilol  6.25 mg Oral BID WC   Chlorhexidine Gluconate Cloth  6 each Topical Daily   feeding supplement  237 mL Oral TID BM   finasteride  5 mg Oral Daily   Gerhardt's butt cream   Topical BID   mirabegron ER  25 mg Oral Daily   multivitamin with minerals  1 tablet Oral Daily   potassium chloride  40 mEq Oral BID   senna-docusate  1 tablet Oral QHS   Warfarin - Pharmacist Dosing Inpatient   Does not apply q1600   Infusions:    PRN: acetaminophen, albuterol, docusate sodium, lip balm, ondansetron (ZOFRAN) IV, mouth rinse, polyethylene glycol  Assessment: 76 yo M with history of Afib on warfarin PTA. Admit INR was 7.3 s/p IV Vit K '10mg'$  on 7/1 and admitted with cholecysititis now s/p percutaneous drain placement. Pt's bladder perforated on 7/7 and is now s/p surgical repair on 7/8 and has been on continuous bladder irrigation (CBI) through 7/12. CBI d/c'd and urology ok with resuming anticoagulation but hematuria increased and IV heparin stopped 7/16. Lovenox for VTE prophylaxis started. Urology obtained imaging to determine if additional surgical interventions were needed. No other plans for surgery at this time, so lovenox 40 mg was discontinued and pharmacy consulted to resume warfarin for Afib.   INR 2.3 after 2.5 mg on 7/22 and no dose the previous day. H/H, plt  stable. Home dose 2.'5mg'$  daily except '5mg'$  MWF.  Goal of Therapy:  INR 2-3 Monitor platelets by anticoagulation protocol: Yes   Plan:  Warfarin 2.5 mg today per his PTA schedule F/u INR and CBC daily CTM for s/sx of bleeding   Varney Daily, PharmD PGY2 Pharmacy Resident  Please check AMION for all [facility] pharmacy phone numbers After 10:00 PM call main pharmacy [phone]

## 2022-01-19 NOTE — Progress Notes (Signed)
Wound care and dressing change teaching provided to pt and wife present at bedside. Appropriate questions asked.  Reviewed signs and symptoms of surgical site infection with teach back.

## 2022-01-20 ENCOUNTER — Other Ambulatory Visit (HOSPITAL_COMMUNITY): Payer: Self-pay

## 2022-01-20 LAB — BASIC METABOLIC PANEL
Anion gap: 12 (ref 5–15)
BUN: 13 mg/dL (ref 8–23)
CO2: 17 mmol/L — ABNORMAL LOW (ref 22–32)
Calcium: 7.8 mg/dL — ABNORMAL LOW (ref 8.9–10.3)
Chloride: 108 mmol/L (ref 98–111)
Creatinine, Ser: 1.06 mg/dL (ref 0.61–1.24)
GFR, Estimated: >60 mL/min (ref >60)
Glucose, Bld: 77 mg/dL (ref 70–99)
Potassium: 4.4 mmol/L (ref 3.5–5.1)
Sodium: 137 mmol/L (ref 135–145)

## 2022-01-20 LAB — CBC
HCT: 30.8 % — ABNORMAL LOW (ref 39.0–52.0)
Hemoglobin: 10.5 g/dL — ABNORMAL LOW (ref 13.0–17.0)
MCH: 31.8 pg (ref 26.0–34.0)
MCHC: 34.1 g/dL (ref 30.0–36.0)
MCV: 93.3 fL (ref 80.0–100.0)
Platelets: 237 10*3/uL (ref 150–400)
RBC: 3.3 MIL/uL — ABNORMAL LOW (ref 4.22–5.81)
RDW: 14.6 % (ref 11.5–15.5)
WBC: 8.5 10*3/uL (ref 4.0–10.5)
nRBC: 0 % (ref 0.0–0.2)

## 2022-01-20 LAB — PROTIME-INR
INR: 2.4 — ABNORMAL HIGH (ref 0.8–1.2)
Prothrombin Time: 26 seconds — ABNORMAL HIGH (ref 11.4–15.2)

## 2022-01-20 MED ORDER — CARVEDILOL 6.25 MG PO TABS
6.2500 mg | ORAL_TABLET | Freq: Two times a day (BID) | ORAL | 5 refills | Status: DC
  Filled 2022-01-20: qty 30, 15d supply, fill #0

## 2022-01-20 MED ORDER — WARFARIN SODIUM 5 MG PO TABS
ORAL_TABLET | ORAL | 5 refills | Status: DC
  Filled 2022-01-20: qty 30, 60d supply, fill #0

## 2022-01-20 MED ORDER — SODIUM CHLORIDE 0.9% FLUSH
5.0000 mL | Freq: Every day | INTRAVENOUS | 1 refills | Status: DC
  Filled 2022-01-20: qty 300, 30d supply, fill #0

## 2022-01-20 MED ORDER — ENSURE ENLIVE PO LIQD
237.0000 mL | Freq: Three times a day (TID) | ORAL | 12 refills | Status: DC
  Filled 2022-01-20: qty 237, 1d supply, fill #0

## 2022-01-20 MED ORDER — WARFARIN SODIUM 5 MG PO TABS
ORAL_TABLET | ORAL | 5 refills | Status: DC
  Filled 2022-01-20: qty 30, fill #0

## 2022-01-20 MED ORDER — ATORVASTATIN CALCIUM 40 MG PO TABS
40.0000 mg | ORAL_TABLET | Freq: Every day | ORAL | 5 refills | Status: DC
  Filled 2022-01-20: qty 30, 30d supply, fill #0

## 2022-01-20 MED ORDER — FINASTERIDE 5 MG PO TABS
5.0000 mg | ORAL_TABLET | Freq: Every day | ORAL | 5 refills | Status: AC
  Filled 2022-01-20: qty 30, 30d supply, fill #0

## 2022-01-20 MED ORDER — MIRABEGRON ER 25 MG PO TB24
25.0000 mg | ORAL_TABLET | Freq: Every day | ORAL | 5 refills | Status: DC
  Filled 2022-01-20: qty 30, 30d supply, fill #0

## 2022-01-20 NOTE — TOC Progression Note (Signed)
Transition of Care [facility]) - Progression Note    Patient Details  Name: [name] MRN: [id number] Date of Birth: 04-04-1946  [facility] CM/SW Contact  Katelyn Broadnax, Edson Snowball, RN Phone Number: 01/20/2022, 12:01 PM  Clinical Narrative:     Ordered home oxygen with [employer]   Expected Discharge Plan: [place] Barriers to Discharge: Continued Medical Work up  Expected Discharge Plan and Services Expected Discharge Plan: [place] In-house Referral: Clinical Social Work Discharge Planning Services: CM Consult Post Acute Care Choice: Home Health, Durable Medical Equipment Living arrangements for the past 2 months: Single Family Home                 DME Arranged: 3-N-1, [place] rolling DME Agency: [employer] Date DME Agency Contacted: 01/18/22 Time DME Agency Contacted: [phone] Representative spoke with at DME Agency: [place]: PT, OT, RN [place] Agency: [place] Date [place]: 01/18/22 Time [place]: 1651 Representative spoke with at [place]: [place] ([place]) Interventions    Readmission Risk Interventions     No data to display

## 2022-01-20 NOTE — Progress Notes (Signed)
Speech Language Pathology Treatment: Dysphagia  Patient Details Name: [name] MRN: [id number] DOB: Jul 29, 1945 Today's Date: 01/20/2022 Time: 4248-1443 SLP Time Calculation (min) (ACUTE ONLY): 10 min  Assessment / Plan / Recommendation Clinical Impression  Therapy session after MBS Friday with family present. When asked, pt recalled swallow test but unable to state strategy of chin tuck. When cued he stated it "sounded familiar". He had straws in room and therapist reviewed results of MBS and recommendations for cup sips and clinical reasoning for chin tuck. Needed moderate cues fading to mild for implementation of chin tuck. No s/s aspiration present. Recommend continue thin liquids with chin tuck, regular texture. Aspiration of MBS likely to prolonged intubation and risk decreases as decreased time lapses from extubation. Recommend home health ST. Goals met for acute care.    HPI HPI: 76 yo admitted 7/1 with malaise, Abd pain and cholecystitis. 7/2 IR placed perc chole drain with intubation same date and sepsis. 7/8 pt with extraperitoneal bladder rupture to OR for ex lap, cystotomy with bladder repair. Intubated 7/2-7/14 PMHx: HLD, CAD, AS, HTN, gout, Afib      SLP Plan  Continue with current plan of care      Recommendations for follow up therapy are one component of a multi-disciplinary discharge planning process, led by the attending physician.  Recommendations may be updated based on patient status, additional functional criteria and insurance authorization.    Recommendations  Diet recommendations: Regular;Thin liquid Liquids provided via: Cup;No straw Medication Administration: Whole meds with puree Supervision: Patient able to self feed Compensations: Slow rate;Small sips/bites;Follow solids with liquid;Chin tuck Postural Changes and/or Swallow Maneuvers: Seated upright 90 degrees                Oral Care Recommendations: Oral care BID Follow Up Recommendations:  Home health SLP Assistance recommended at discharge: Frequent or constant Supervision/Assistance SLP Visit Diagnosis: Dysphagia, oropharyngeal phase (R13.12) Plan: Continue with current plan of care           Houston Siren  01/20/2022, 11:20 AM

## 2022-01-20 NOTE — Progress Notes (Signed)
ANTICOAGULATION CONSULT NOTE - Follow Up Consult  Pharmacy Consult for Warfarin Indication: atrial fibrillation  No Known Allergies  Patient Measurements: Height: '5\' 6"'$  (167.6 cm) Weight: 80.7 kg (178 lb) IBW/kg (Calculated) : 63.8  Vital Signs: Temp: 97.8 F (36.6 C) (07/24 0749) Temp Source: Oral (07/24 0749) BP: 135/96 (07/24 0749) Pulse Rate: 56 (07/24 0749)  Labs: Recent Labs    01/18/22 0140 01/19/22 0250 01/20/22 0118  HGB 9.7* 10.7* 10.5*  HCT 29.0* 32.2* 30.8*  PLT 267 277 237  LABPROT 23.4* 24.8* 26.0*  INR 2.1* 2.3* 2.4*  CREATININE 1.29* 1.11 1.06     Estimated Creatinine Clearance: 61.1 mL/min (by C-G formula based on SCr of 1.06 mg/dL).   Medications:  Medications Prior to Admission  Medication Sig Dispense Refill Last Dose   allopurinol (ZYLOPRIM) 100 MG tablet TAKE 2 TABLETS EVERY DAY (DOSE INCREASE) (Patient taking differently: Take 200 mg by mouth daily.) 180 tablet 3 12/27/2021   atorvastatin (LIPITOR) 40 MG tablet TAKE 1 TABLET EVERY DAY (Patient taking differently: Take 40 mg by mouth daily.) 90 tablet 3 12/27/2021   carvedilol (COREG) 6.25 MG tablet TAKE 1 TABLET TWICE DAILY WITH MEALS (NEED MD APPOINTMENT) (Patient taking differently: Take 6.25 mg by mouth 2 (two) times daily with a meal.) 180 tablet 3 12/28/2021 at 07:30   furosemide (LASIX) 20 MG tablet TAKE 1 TABLET EVERY DAY (Patient taking differently: Take 20 mg by mouth daily.) 90 tablet 3 12/27/2021   losartan (COZAAR) 50 MG tablet TAKE 1 TABLET EVERY DAY (Patient taking differently: Take 50 mg by mouth daily.) 90 tablet 3 12/28/2021   naproxen sodium (ALEVE) 220 MG tablet Take 440 mg by mouth daily as needed (pain).   12/27/2021   warfarin (COUMADIN) 5 MG tablet Take 1-2 tablets by mouth daily or as directed by Coumadin clinic (Patient taking differently: Take 2.5-5 mg by mouth daily. 2.5 mg daily on Sunday, Tuesday, Thursday, Saturday 5 mg daily on Monday, Wednesday, Friday) 90 tablet 0 12/28/2021  at 07:30   Scheduled:   atorvastatin  40 mg Oral Daily   carvedilol  6.25 mg Oral BID WC   Chlorhexidine Gluconate Cloth  6 each Topical Daily   feeding supplement  237 mL Oral TID BM   finasteride  5 mg Oral Daily   Gerhardt's butt cream   Topical BID   mirabegron ER  25 mg Oral Daily   multivitamin with minerals  1 tablet Oral Daily   senna-docusate  1 tablet Oral QHS   Warfarin - Pharmacist Dosing Inpatient   Does not apply q1600    PRN: acetaminophen, albuterol, docusate sodium, lip balm, ondansetron (ZOFRAN) IV, mouth rinse, polyethylene glycol  Assessment: 76 yo M with history of Afib on warfarin PTA. Admit INR was 7.3 s/p IV Vit K '10mg'$  on 7/1 and admitted with cholecysititis now s/p percutaneous drain placement. Pt's bladder perforated on 7/7 and is now s/p surgical repair on 7/8 and has been on continuous bladder irrigation (CBI) through 7/12. CBI d/c'd and urology ok with resuming anticoagulation but hematuria increased and IV heparin stopped 7/16. Lovenox for VTE prophylaxis started. Urology obtained imaging to determine if additional surgical interventions were needed. No other plans for surgery at this time, so lovenox 40 mg was discontinued and pharmacy consulted to resume warfarin for Afib.   INR 2.4. H/H, plt stable.  Home dose 2.'5mg'$  daily except '5mg'$  MWF.  Goal of Therapy:  INR 2-3 Monitor platelets by anticoagulation protocol: Yes   Plan:  For discharge, suggest warfarin 2.5 mg daily Patient has INR follow up on 7/27   Benetta Spar, PharmD, BCPS, [facility] Clinical Pharmacist  Please check AMION for all [place] phone numbers After 10:00 PM, call [place] [phone]

## 2022-01-20 NOTE — Discharge Summary (Signed)
Name: [name] MRN: [id number] DOB: [birth year] 76 y.o. PCP: Axel Filler, MD  Date of Admission: 12/28/2021  9:32 AM Date of Discharge:   01/20/2022 Attending Physician: Dr. Evette Doffing  Discharge Diagnosis: Principal Problem:   Acute cholecystitis Active Problems:   Acute respiratory failure with hypoxia ([place])   Encounter for biliary drainage tube placement   Hematuria   Extraperitoneal rupture of bladder   Leukocytosis   Pressure injury of skin    Discharge Medications: Allergies as of 01/20/2022   No Known Allergies      Medication List     STOP taking these medications    allopurinol 100 MG tablet Commonly known as: ZYLOPRIM   furosemide 20 MG tablet Commonly known as: LASIX   losartan 50 MG tablet Commonly known as: COZAAR   naproxen sodium 220 MG tablet Commonly known as: ALEVE       TAKE these medications    atorvastatin 40 MG tablet Commonly known as: LIPITOR Take 1 tablet (40 mg total) by mouth daily.   BD PosiFlush 0.9 % Soln injection Generic drug: sodium chloride flush Flush 5 mLs by Intracatheter route daily.   carvedilol 6.25 MG tablet Commonly known as: COREG Take 1 tablet (6.25 mg total) by mouth 2 (two) times daily with a meal. What changed: See the new instructions.   feeding supplement Liqd Take 237 mLs by mouth 3 (three) times daily between meals.   finasteride 5 MG tablet Commonly known as: PROSCAR Take 1 tablet (5 mg total) by mouth daily. Start taking on: January 21, 2022   Myrbetriq 25 MG Tb24 tablet Generic drug: mirabegron ER Take 1 tablet (25 mg total) by mouth daily. Start taking on: January 21, 2022   warfarin 5 MG tablet Commonly known as: COUMADIN Take as directed. If you are unsure how to take this medication, talk to your nurse or doctor. Original instructions: Take 0.5 mg tablets (2.5 mg total) daily until follow up with INR clinic What changed: additional instructions                Durable Medical Equipment  (From admission, onward)           Start     Ordered   01/19/22 1343  For home use only DME oxygen  Once       Question Answer Comment  Length of Need Lifetime   Mode or (Route) Nasal cannula   Liters per Minute 2   Frequency Continuous (stationary and portable oxygen unit needed)   Oxygen conserving device Yes   Oxygen delivery system Gas      01/19/22 1342   01/18/22 1729  For home use only DME Walker rolling  Once       Question Answer Comment  Walker: With [place]   Patient needs a walker to treat with the following condition Bladder rupture   Patient needs a walker to treat with the following condition Wound disruption, post-op, skin   Patient needs a walker to treat with the following condition Sepsis [facility])   Patient needs a walker to treat with the following condition Cholecystitis   Patient needs a walker to treat with the following condition Respiratory failure ([place])      01/18/22 1730   01/18/22 1726  For home use only DME 3 n 1  Once        01/18/22 1730              Discharge Care Instructions  (  From admission, onward)           Start     Ordered   01/20/22 0000  Discharge wound care:       Comments: Continue wound care as instructed   01/20/22 1529            Disposition and follow-up:   [name] was discharged from [facility] in Stable condition.  At the hospital follow up visit please address:  1.  Follow-up:  a.  Acute cholecystitis: Status post cholecystectomy drain placed by IR. Please ensure drain is stable in place and being managed appropriately. Ensure patient follows up with IR for drain check.    b.  Acute hypoxic respiratory failure: Patient discharged on 2 L [place]. Assess respiratory status and re-evaluate need for home oxygen.  Also reassess his need for his as needed Lasix.   c.  Perforated bladder: S/p repair by urology. Discharged home with a Foley and to  continue wet-to-dry dressing on abdominal wound. Check abdominal wound and ensure patient is getting appropriate wound care and has follow-up with urology for cystogram in 3 weeks.   d.  Valvular A-fib: INR at discharge was 2.4. Patient discharged home on warfarin 2.5 mg.  Assess for any bleeding and ensure patient follows up with INR clinic.   e.  Hypertension: Patient blood pressure remained stable on Coreg alone.  Please check blood pressure and consider restarting his losartan if appropriate.   2.  Labs / imaging needed at time of follow-up: CBC, BMP, PT-INR  3.  Pending labs/ test needing follow-up: None  Follow-up Appointments:  Follow-up Information     Jesusita Oka, MD Follow up on 02/20/2022.   Specialty: Surgery Why: 8:30am, Arrive 30 minutes prior to your appointment time, Please bring your insurance card and photo ID Contact information: [place] [address] [phone]         Criselda Peaches, MD Follow up in 4 week(s).   Specialties: Interventional Radiology, Radiology Why: For drain check Contact information: [place] [address] [phone]         [facility] [place] Follow up.   Specialty: [place] Why: someone from the office will call to schedule home health visits Contact information: [place] [address] [phone]          [facility] Follow up on 01/23/2022.   Why: Please go to this hospital follow up appointment at 9:45 am. Arrive 15 minutes before your appointment Contact information: [address] [place] [phone]        [place] Office. Go on 01/24/2022.   Specialty: Cardiology Why: Go to anti-coagulation office for INR check at 2:30 pm. Contact information: [address], Between  [place] by problem list: [name] is a 76 y.o. male with hx of CAD, aortic stenosis, atrial fibrillation who presented with nausea, vomiting, abdominal pain, dyspnea and admitted for septic shock secondary to acute cholecystitis with associated respiratory failure.  He was initially intubated with percutaneous biliary drain placed with subsequent bladder rupture requiring surgical repair on July 8.  Extubated on July 13 and transferred to IMTS on July 15.  Symptoms and leukocytosis resolving with  decreasing oxygen requirement.  He was evaluated by PT and OT and was deemed to be appropriate for acute inpatient rehab in [facility]. patient and family however decided to go home with home health PT/OT.  #Perforated bladder status post surgical repair #Gross hematuria Patient was noted to have worsening renal function with hematuria noted on 7/4 status post in and out cath. Found to have a ruptured bladder on CT imaging 7/7 and was taken to the OR for an exploratory laparotomy and bladder repair. Pelvic drain was discontinued on 7/14. Hemoglobin stable status post drain removal and hematuria cleared by 7/18.  Anticoagulation with heparin was held until 7/19, when he was restarted on Coumadin.  He was on prophylactic Lovenox in the interim. He had a CT cystogram on 7/22 that showed a very small leak on the anterior aspect with contrast in the wound but no fascial dehiscence. Urology recommended wet-to-dry dressing. Patient was discharged on 7/24 with Foley in place and to continue wet-to-dry dressing at home. Urology will follow-up for outpatient cystogram in 3 weeks after discharge.  #Sepsis secondary to acute cholecystitis s/p percutaneous drain placement, resolved Patient presented in septic shock requiring pressor support. He was too critically ill to tolerate surgery, so IR placed a percutaneous cholecystostomy tube was placed on 7/2 and patient  completed 7 days of IV ceftriaxone. Patient weaned off pressor support on 7/7.  Patient reported no abdominal pain, fever, nausea, or vomiting after being transition to the IMTS and has been able to tolerate increased p.o. intake. He remained afebrile and hemodynamically stable. His leukocytosis is slowly resolving and bilious drainage from percutaneous drain decreasing over time.  IR to reassess drain following discharge and drain removal scheduled either simultaneously with subsequent cholecystectomy or following cap trial if surgery deems patient to not be a candidate for cholecystectomy.  #Acute hypoxic respiratory failure Patient initially intubated after presenting in septic shock, extubated on July 13. Respiratory exam and oxygen requirement decreased over time with Lasix diuresis and pulmonary hygiene.  Patient saturated 91% at rest and 80% while ambulating on room air and 88% on 4 L nasal cannula while ambulating, cleared for home oxygen use at this time. Ambulation with pulse ox was repeated and patient only required 2 L [place] to maintain O2 sat at 90%.  Patient was discharged home with 2 L of supplemental oxygen. Patient's need for supplemental oxygen will be evaluated in the outpatient.  #Acute metabolic encephalopathy, resolved  Patient had ICU/septic delirium which improved. He was receiving Seroquel and Klonopin which was discontinued 7/14. Patient able to answer all orientation questions appropriately.  #Permanent Valvular A-fib on Coumadin #Supra therapeutic INR, resolved   Left atrial appendage noted on CT scan though with likely artifact is no correlation on echocardiogram. On admission patient presented with a supra therapeutic INR at 7.3. This improved to 1.9 last checked on 7/4. Heparin initially started but subsequently discontinued due to worsening of gross hematuria.  Patient noted to be in A-fib but never in RVR on home Coreg. Coumadin was restarted on 7/19 after cessation of  hematuria. Patient was continued on 2.5 mg of Coumadin for the rest of his hospitalization. At discharge, INR was 2.4. Patient was discharged home on Coumadin 2.5 mg with instructions to follow-up with INR clinic for re-evaluation.  #Hypertension #CAD #Hyperlipidemia #Moderate aortic valve stenosis Home medications include carvedilol, losartan furosemide and atorvastatin. Patient diuresed with Lasix to improve respiratory status throughout admission. Of note he was euvolemic from 7/18 to 7/19 and  was not given Lasix. His home losartan was not restarted as of 7/19 because his blood pressures were l well managed on just carvedilol. Patient's losartan and Lasix were held at discharge. These medications can be restarted at outpatient setting if necessary.  #AKI Patient presented with likely prerenal AKI versus ATN in the context of septic shock with initial creatinine 3.08 creatinine subsequently normalized over the course of approximately 1 week with aggressive initial fluid resuscitation and pressor support. AKI resolved on 7/13.  #Gout Home allopurinol held. Consider restarting on discharge but might need to cover for acute flare.    Subjective: Patient evaluated at bedside laying comfortably in bed. Patient states she felt fine and had no acute complaint. Discussed plan for discharge home with home health later today. Patient informed team that he has a written set up at home.   Discharge Vitals:   BP (!) 135/96 (BP Location: Left Arm)   Pulse (!) 56   Temp 97.8 F (36.6 C) (Oral)   Resp 18   Ht '5\' 6"'$  (1.676 m)   Wt 80.7 kg   SpO2 95%   BMI 28.73 kg/m  Discharge exam: General: Pleasant, chronically ill-appearing elderly man in bed. No acute distress. CV: Regular rate.  Irregular rhythm. II/VI systolic murmur.  No LE edema Pulmonary: Lungs CTAB. Normal effort. No wheezing or rales. Abdominal: Soft, NT/ND. Normal BS. Perc biliary drain w/ bilious drainage. Lower abdomen incision packed  with wet-to-dry dressing, mild ttp around wound.  GU: Foly catheter with clear urine.  Extremities: Radial and DP pulses 2+ and symmetric. Normal ROM. Skin: Warm and dry. No obvious rash or lesions. Neuro: A&Ox3. Moves all extremities. Normal sensation. No focal deficit. Psych: Normal mood and affect   Pertinent Labs, Studies, and Procedures:     Latest Ref Rng & Units 01/20/2022    1:18 AM 01/19/2022    2:50 AM 01/18/2022    1:40 AM  CBC  WBC 4.0 - 10.5 K/uL 8.5  10.2  9.4   Hemoglobin 13.0 - 17.0 g/dL 10.5  10.7  9.7   Hematocrit 39.0 - 52.0 % 30.8  32.2  29.0   Platelets 150 - 400 K/uL 237  277  267        Latest Ref Rng & Units 01/20/2022    1:18 AM 01/19/2022    2:50 AM 01/18/2022    1:40 AM  CMP  Glucose 70 - 99 mg/dL 77  77  95   BUN 8 - 23 mg/dL '13  13  16   '$ Creatinine 0.61 - 1.24 mg/dL 1.06  1.11  1.29   Sodium 135 - 145 mmol/L 137  136  137   Potassium 3.5 - 5.1 mmol/L 4.4  3.1  3.7   Chloride 98 - 111 mmol/L 108  106  104   CO2 22 - 32 mmol/L '17  21  26   '$ Calcium 8.9 - 10.3 mg/dL 7.8  7.6  7.7     DG CHEST PORT 1 VIEW  Result Date: 12/29/2021 CLINICAL DATA:  Central line placement. EXAM: PORTABLE CHEST 1 VIEW COMPARISON:  Radiograph 45 minutes ago, CT yesterday FINDINGS: New left internal jugular central venous catheter tip overlies the atrial caval junction. No pneumothorax. Endotracheal tube tip just below the clavicular heads. Tip and side port of the enteric tube below the diaphragm in the stomach. Persistent low lung volumes. Stable heart size and mediastinal contours, bibasilar atelectasis. Possible developing pleural effusions. Cholecystostomy tube in the right upper quadrant.  Dense aortic atherosclerosis. IMPRESSION: 1. New left internal jugular central venous catheter with tip overlying the atrial caval junction. No pneumothorax. 2. Persistent low lung volumes with bibasilar atelectasis and possible developing pleural effusions. Electronically Signed   By: Keith Rake M.D.   On: 12/29/2021 19:57   DG CHEST PORT 1 VIEW  Result Date: 12/29/2021 CLINICAL DATA:  [id number]; enteric tube placement EXAM: PORTABLE CHEST 1 VIEW; portable abdomen one view COMPARISON:  CT dated December 28, 2021 FINDINGS: The cardiomediastinal silhouette is unchanged in contour.ETT tip terminates 2.5 Cm above the carina. Small LEFT pleural effusion. Bibasilar opacities likely atelectasis. No pneumothorax. Atherosclerotic calcifications throughout the aorta. Enteric tube tip and side port project over the distal stomach. Percutaneous cholecystectomy tube projects over the RIGHT abdomen. New gaseous dilation of multiple loops of small bowel, likely ileus given recent surgical intervention. Status post bilateral shoulder arthroplasty. IMPRESSION: 1. Enteric tube tip and side port project over the distal stomach. 2. ETT tip terminates between the thoracic inlet and the carina. 3. New gaseous dilation of loops of small bowel. This likely reflect ileus given recent percutaneous cholecystectomy tube placement. If persistent concern for obstruction, recommend dedicated CT or serial KUBs. 4. Small LEFT pleural effusion. Electronically Signed   By: Valentino Saxon M.D.   On: 12/29/2021 19:10   DG Abd Portable 1V  Result Date: 12/29/2021 CLINICAL DATA:  [id number]; enteric tube placement EXAM: PORTABLE CHEST 1 VIEW; portable abdomen one view COMPARISON:  CT dated December 28, 2021 FINDINGS: The cardiomediastinal silhouette is unchanged in contour.ETT tip terminates 2.5 Cm above the carina. Small LEFT pleural effusion. Bibasilar opacities likely atelectasis. No pneumothorax. Atherosclerotic calcifications throughout the aorta. Enteric tube tip and side port project over the distal stomach. Percutaneous cholecystectomy tube projects over the RIGHT abdomen. New gaseous dilation of multiple loops of small bowel, likely ileus given recent surgical intervention. Status post bilateral shoulder arthroplasty.  IMPRESSION: 1. Enteric tube tip and side port project over the distal stomach. 2. ETT tip terminates between the thoracic inlet and the carina. 3. New gaseous dilation of loops of small bowel. This likely reflect ileus given recent percutaneous cholecystectomy tube placement. If persistent concern for obstruction, recommend dedicated CT or serial KUBs. 4. Small LEFT pleural effusion. Electronically Signed   By: Valentino Saxon M.D.   On: 12/29/2021 19:10   IR Perc Cholecystostomy  Result Date: 12/29/2021 INDICATION: 76 year old male with acute cholecystitis and active sepsis. He is currently not a surgical candidate and presents for percutaneous cholecystostomy tube placement. Incidentally, the interventional radiology suite is currently down. Therefore, we will proceed using ultrasound guidance only out of necessity. EXAM: CHOLECYSTOSTOMY MEDICATIONS: Patient currently receiving intravenous Zosyn. No additional antibiotic prophylaxis employed. ANESTHESIA/SEDATION: Moderate (conscious) sedation was employed during this procedure. A total of Versed 0.5 mg and Fentanyl 25 mcg was administered intravenously. Moderate Sedation Time: 11 minutes. The patient's level of consciousness and vital signs were monitored continuously by radiology nursing throughout the procedure under my direct supervision. FLUOROSCOPY TIME:  None. COMPLICATIONS: None immediate. PROCEDURE: Informed written consent was obtained from the patient after a thorough discussion of the procedural risks, benefits and alternatives. All questions were addressed. Maximal Sterile Barrier Technique was utilized including caps, mask, sterile gowns, sterile gloves, sterile drape, hand hygiene and skin antiseptic. A timeout was performed prior to the initiation of the procedure. Ultrasound was used to interrogate the right upper quadrant. The gallbladder is distended. There is a small amount of perihepatic ascites. A suitable  skin entry site was selected  and marked. Local anesthesia was attained by infiltration with 1% lidocaine. A small dermatotomy was made. Under real-time ultrasound guidance, a 21 gauge Chiba needle was advanced through a short transhepatic course and into the gallbladder lumen. Under real-time ultrasound guidance, the 0.021 wire was coiled in the gallbladder lumen. The needle was removed. Under real-time ultrasound guidance, the Accustick transitional sheath was advanced over the wire and into the gallbladder lumen. The stylet and wire were removed. There was return of dark black bile. A 0.035 wire was coiled within the gallbladder lumen. Again, utilizing real time ultrasound guidance, the percutaneous tract was dilated to 10 [place] and a [place] all-purpose drainage catheter was carefully advanced in the gallbladder lumen. Small amount of bile was aspirated and sent for culture. The drain was then connected to gravity bag drainage and secured to the skin with 0 Prolene suture and an adhesive fixation device. The patient tolerated the procedure well. IMPRESSION: Successful placement of a percutaneous transhepatic cholecystostomy tube using ultrasound imaging guidance. Samples of bile were sent for Gram stain and culture. Electronically Signed   By: Jacqulynn Cadet M.D.   On: 12/29/2021 16:06   ECHOCARDIOGRAM COMPLETE  Result Date: 12/29/2021    ECHOCARDIOGRAM REPORT   Patient Name:   DEMARQUS JOCSON Date of Exam: 12/29/2021 Medical Rec #:  [id number]         Height:       66.0 in Accession #:    [id number]        Weight:       191.4 lb Date of Birth:  Jan 30, 1946         BSA:          1.963 m Patient Age:    42 years          BP:           107/67 mmHg Patient Gender: M                 HR:           106 bpm. Exam Location:  Inpatient Procedure: 2D Echo Indications:    Atrial fibrillation  History:        Patient has prior history of Echocardiogram examinations, most                 recent 08/29/2021. CAD, Aortic Valve Disease,  Arrythmias:Atrial                 Fibrillation, Signs/Symptoms:Murmur; Risk Factors:Hypertension                 and Dyslipidemia.  Sonographer:    [name] RDCS Referring Phys: [place]  1. Left ventricular ejection fraction, by estimation, is 60 to 65%. The left ventricle has normal function. The left ventricle has no regional wall motion abnormalities. Left ventricular diastolic parameters are indeterminate.  2. Right ventricular systolic function is normal. The right ventricular size is normal.  3. Left atrial size was moderately dilated.  4. The mitral valve is normal in structure. Mild mitral valve regurgitation. No evidence of mitral stenosis.  5. The aortic valve is calcified. Aortic valve regurgitation is mild. Moderate aortic valve stenosis. Comparison(s): No significant change from prior study. FINDINGS  Left Ventricle: Left ventricular ejection fraction, by estimation, is 60 to 65%. The left ventricle has normal function. The left ventricle has no regional wall motion abnormalities. The left ventricular internal cavity size was  normal in size. There is  no left ventricular hypertrophy. Left ventricular diastolic parameters are indeterminate. Right Ventricle: The right ventricular size is normal. Right ventricular systolic function is normal. Left Atrium: Left atrial size was moderately dilated. Right Atrium: Right atrial size was normal in size. Pericardium: There is no evidence of pericardial effusion. Mitral Valve: The mitral valve is normal in structure. Mild mitral annular calcification. Mild mitral valve regurgitation. No evidence of mitral valve stenosis. Tricuspid Valve: The tricuspid valve is normal in structure. Tricuspid valve regurgitation is mild . No evidence of tricuspid stenosis. Aortic Valve: The aortic valve is calcified. Aortic valve regurgitation is mild. Moderate aortic stenosis is present. Aortic valve mean gradient measures 17.3 mmHg. Aortic valve  peak gradient measures 30.6 mmHg. Aortic valve area, by VTI measures 1.11 cm. Pulmonic Valve: The pulmonic valve was normal in structure. Pulmonic valve regurgitation is trivial. No evidence of pulmonic stenosis. Aorta: The aortic root is normal in size and structure. Venous: The inferior vena cava was not well visualized. IAS/Shunts: The interatrial septum was not well visualized.  LEFT VENTRICLE PLAX 2D LVIDd:         3.90 cm LVIDs:         2.70 cm LV PW:         1.10 cm LV IVS:        1.00 cm LVOT diam:     2.20 cm LV SV:         51 LV SV Index:   26 LVOT Area:     3.80 cm  RIGHT VENTRICLE RV S prime:     10.60 cm/s TAPSE (M-mode): 1.1 cm LEFT ATRIUM           Index        RIGHT ATRIUM           Index LA diam:      5.10 cm 2.60 cm/m   RA Area:     16.50 cm LA Vol (A4C): 80.6 ml 41.07 ml/m  RA Volume:   39.10 ml  19.92 ml/m  AORTIC VALVE AV Area (Vmax):    1.05 cm AV Area (Vmean):   1.00 cm AV Area (VTI):     1.11 cm AV Vmax:           276.67 cm/s AV Vmean:          195.000 cm/s AV VTI:            0.455 m AV Peak Grad:      30.6 mmHg AV Mean Grad:      17.3 mmHg LVOT Vmax:         76.13 cm/s LVOT Vmean:        51.433 cm/s LVOT VTI:          0.133 m LVOT/AV VTI ratio: 0.29  AORTA Ao Root diam: 3.30 cm Ao Asc diam:  3.30 cm TRICUSPID VALVE TR Peak grad:   43.6 mmHg TR Vmax:        330.00 cm/s  SHUNTS Systemic VTI:  0.13 m Systemic Diam: 2.20 cm Kirk Ruths MD Electronically signed by Kirk Ruths MD Signature Date/Time: 12/29/2021/2:08:02 PM    Final    DG Chest Port 1 View  Result Date: 12/29/2021 CLINICAL DATA:  Pulmonary edema EXAM: PORTABLE CHEST 1 VIEW COMPARISON:  Portable exam 1201 hours compared to 08/04/2013 FINDINGS: Enlargement of cardiac silhouette. Atherosclerotic calcification aorta. Decreased lung volumes with bibasilar atelectasis and central peribronchial thickening. No definite infiltrate, pleural effusion, or pneumothorax.  Osseous demineralization with BILATERAL shoulder prostheses.  IMPRESSION: Enlargement of cardiac silhouette with bibasilar atelectasis. Aortic Atherosclerosis (ICD10-I70.0). Electronically Signed   By: Lavonia Dana M.D.   On: 12/29/2021 12:15   US Abdomen Limited RUQ (LIVER/GB)  Result Date: 12/28/2021 CLINICAL DATA:  Follow-up from CT a chest, abdomen and pelvis that showed findings consistent with acute cholecystitis. Patient being evaluated for chest and abdominal pain and GI bleeding. EXAM: ULTRASOUND ABDOMEN LIMITED RIGHT UPPER QUADRANT COMPARISON:  Current CTA chest, abdomen and pelvis. FINDINGS: Gallbladder: Gallbladder moderately distended. Wall measures 5 mm in thickness. Trace pericholecystic fluid around the fundus. No defined stones, but dependent sludge is evident. No sonographic Murphy's sign. Common bile duct: Diameter: 4-5 mm. Liver: No focal lesion identified. Within normal limits in parenchymal echogenicity. Portal vein is patent on color Doppler imaging with normal direction of blood flow towards the liver. Other: None. IMPRESSION: 1. Sonographic findings correlate with the CT findings. Gallbladder is distended with mild wall thickening. There is a small amount of pericholecystic fluid. No defined stone, but dependent sludge is evident. Findings support acute cholecystitis in the proper clinical setting. If clinical findings are equivocal, consider follow-up HIDA scan. Electronically Signed   By: Lajean Manes M.D.   On: 12/28/2021 11:41   CT Angio Chest/Abd/Pel for Dissection W and/or W/WO  Result Date: 12/28/2021 CLINICAL DATA:  Acute aortic syndrome suspected, chest and abdominal pain, GI bleed EXAM: CT ANGIOGRAPHY CHEST, ABDOMEN AND PELVIS TECHNIQUE: Non-contrast CT of the chest was initially obtained. Multidetector CT imaging through the chest, abdomen and pelvis was performed using the standard protocol during bolus administration of intravenous contrast. Multiplanar reconstructed images and MIPs were obtained and reviewed to evaluate the vascular  anatomy. RADIATION DOSE REDUCTION: This exam was performed according to the departmental dose-optimization program which includes automated exposure control, adjustment of the mA and/or kV according to patient size and/or use of iterative reconstruction technique. CONTRAST:  14m OMNIPAQUE IOHEXOL 350 MG/ML SOLN IV COMPARISON:  Noncontrast CT chest 09/06/2007 FINDINGS: CTA CHEST FINDINGS Cardiovascular: Atherosclerotic calcifications aorta, proximal great vessels and coronary arteries. Aorta normal caliber. Heart size normal. No pericardial effusion. Question thrombus versus unopacified blood within LEFT atrial appendage. Normal aortic enhancement without dissection. Pulmonary arteries patent without evidence of pulmonary embolism. Mediastinum/Nodes: Esophagus unremarkable. Few normal sized mediastinal lymph nodes. Base of cervical region normal appearance. No thoracic adenopathy. Lungs/Pleura: Dependent atelectasis posterior lower lobes. Lungs otherwise clear. No pulmonary infiltrate, pleural effusion, or pneumothorax. Musculoskeletal: BILATERAL shoulder prostheses. No acute osseous findings. Review of the MIP images confirms the above findings. CTA ABDOMEN AND PELVIS FINDINGS VASCULAR Aorta: Aorta normal caliber without aneurysm or dissection. Celiac: Minimal plaque at origin.  No significant narrowing. SMA: Mild plaque at origin.  Less than 50% narrowing. Renals: Plaque at origins of BILATERAL renal arteries, greater D% RIGHT, less than 50% LEFT. Accessory LEFT renal artery. IMA: Patent Inflow: Atherosclerotic calcifications with narrowing of RIGHT greater than LEFT external iliac arteries. Veins: Unremarkable Review of the MIP images confirms the above findings. NON-VASCULAR Hepatobiliary: Liver unremarkable. Gallbladder distended with dependent calculi, wall thickening and pericholecystic infiltrative changes suspicious for acute cholecystitis. No biliary dilatation. Pancreas: Normal appearance Spleen: Normal  appearance Adrenals/Urinary Tract: Adrenal glands normal appearance. Small BILATERAL renal cysts; no follow-up imaging recommended. Tiny nonobstructing calculus upper pole RIGHT kidney. No solid renal mass, hydronephrosis, hydroureter, or ureteral calcification. Bladder unremarkable. Stomach/Bowel: Diverticulosis of descending and sigmoid colon without evidence of diverticulitis. Normal appendix. Stomach and bowel loops unremarkable. Lymphatic: No adenopathy  Reproductive: Prostatic enlargement with gland measuring 5.8 x 5.6 x 5.3 cm (volume = 90 cm3) Other: No free air or free fluid.  No hernia. Musculoskeletal: Degenerative disc disease changes lumbar spine. Review of the MIP images confirms the above findings. IMPRESSION: No evidence of aortic aneurysm or dissection. Extensive atherosclerotic calcifications as above including coronary arteries. Question thrombus versus unopacified blood within LEFT atrial appendage; can assess by echocardiography. Cholelithiasis with calculi, wall thickening and surrounding inflammatory changes highly suspicious for acute cholecystitis. Distal colonic diverticulosis without evidence of diverticulitis. Prostatic enlargement. Aortic Atherosclerosis (ICD10-I70.0). Electronically Signed   By: Lavonia Dana M.D.   On: 12/28/2021 10:44     Discharge Instructions: Discharge Instructions     Call MD for:  persistant dizziness or light-headedness   Complete by: As directed    Call MD for:  persistant nausea and vomiting   Complete by: As directed    Call MD for:  severe uncontrolled pain   Complete by: As directed    Diet - low sodium heart healthy   Complete by: As directed    Discharge wound care:   Complete by: As directed    Continue wound care as instructed   Increase activity slowly   Complete by: As directed          Discharge Instructions      Mr. Mirarchi, It was a pleasure taking care of you at [facility] were admitted for cholecystitis and  required a stay in the ICU. You were too sick to go to surgery so a drain was placed in. You had a bladder rupture that required repair by urology. We are discharging you home now that you are doing better. You are going home with the foley, drain in your gallbladder, and supplemental oxygen. Please follow the following instructions:   1) Continue wound care and drain care as instructed  2) Follow up with the internal medicine clinic, urology, IR and surgery as scheduled 3) Take warfarin 2.5 mg daily until follow up with the INR clinic on Friday at 2:30 pm.  4) Continue taking all your medications as prescribed  Take care,  Dr. Linwood Dibbles, MD, [facility] Stay Proper nutrition can help your body recover from illness and injury.   Foods and beverages high in protein, vitamins, and minerals help rebuild muscle loss, promote healing, & reduce fall risk.   In addition to eating healthy foods, a nutrition shake is an easy, delicious way to get the nutrition you need during and after your hospital stay  It is recommended that you continue to drink 2 bottles per day of: Ensure Plus for at least 1 month (30 days) after your hospital stay   Tips for adding a nutrition shake into your routine: As allowed, drink one with vitamins or medications instead of water or juice Enjoy one as a tasty mid-morning or afternoon snack Drink cold or make a milkshake out of it Drink one instead of milk with cereal or snacks Use as a coffee creamer   Available at the following grocery stores and pharmacies:           * [facility] [phone]  For COUPONS visit: www.ensure.com/join or [email or web address]   Suggested Substitutions Ensure Plus = Boost Plus = Carnation Breakfast Essentials = Boost  Compact Ensure Active Clear = Boost Breeze Glucerna Shake = Boost Glucose Control = Carnation Breakfast Essentials SUGAR FREE        Signed: Lacinda Axon, MD 01/20/2022, 3:49 PM   Pager: [phone]

## 2022-01-20 NOTE — Progress Notes (Addendum)
Physical Therapy Treatment Patient Details Name: [name] MRN: [id number] DOB: Feb 24, 1946 Today's Date: 01/20/2022   History of Present Illness 76 yo admitted 7/1 with malaise, Abd pain and cholecystitis. 7/2 IR placed perc chole drain with intubation same date and sepsis. 7/8 pt with extraperitoneal bladder rupture to OR for ex lap, cystotomy with bladder repair. PMHx: HLD, CAD, AS, HTN, gout, Afib    PT Comments    Pt admitted with above diagnosis. Pt was able to ambulate with RW and needfor 2LO2 and progressed distance today.  Continues to need standing rest breaks and cues for pursed lip breathing with pts' family demonstrating that they can cue pt appropriately. Pt's d/c plan and eequipment updated as daughter and wife have decided to care for pt at home. Will continue seeing pt until he is d/c'd.  Pt currently with functional limitations due to the deficits listed below (see PT Problem List). Pt will benefit from skilled PT to increase their independence and safety with mobility to allow discharge to the venue listed below.      Recommendations for follow up therapy are one component of a multi-disciplinary discharge planning process, led by the attending physician.  Recommendations may be updated based on patient status, additional functional criteria and insurance authorization.  Follow Up Recommendations  Home health PT     Assistance Recommended at Discharge Frequent or constant Supervision/Assistance  Patient can return home with the following Two people to help with walking and/or transfers;Two people to help with bathing/dressing/bathroom;Direct supervision/assist for medications management;Assistance with feeding;Assist for transportation;Help with stairs or ramp for entrance;Direct supervision/assist for financial management;Assistance with cooking/housework   Equipment Recommendations  Rolling walker (2 wheels);BSC/3in1, home O2   Recommendations for Other Services        Precautions / Restrictions Precautions Precautions: Fall;Other (comment) Precaution Comments: lap chole drain,  penile wounds Restrictions Weight Bearing Restrictions: No     Mobility  Bed Mobility Overal bed mobility: Needs Assistance Bed Mobility: Rolling, Sit to Supine Rolling: Min guard   Supine to sit: Min guard     General bed mobility comments: Pt needed min guard assist to come to EOB needing no assist to initiate LEs to EOB.    Transfers Overall transfer level: Needs assistance Equipment used: Rolling walker (2 wheels) Transfers: Sit to/from Stand Sit to Stand: Min guard           General transfer comment: Steadying assist and cues for hand placement from bedside with RW.  Slow to rise.    Ambulation/Gait Ambulation/Gait assistance: Min guard Gait Distance (Feet): 200 Feet Assistive device: Rolling walker (2 wheels) Gait Pattern/deviations: Step-through pattern, Decreased stride length, Trunk flexed, Wide base of support, Drifts right/left   Gait velocity interpretation: <1.31 ft/sec, indicative of household ambulator   General Gait Details: Pt needs assist to steer RW at times  and for sequencing steps and RW. Daughter and wife present and used gait belt to assist pt.  Pt needs standing rest breaks and cues for PLB.  Pt on no oxygen on arrival with sats 91%.  Pt needed 2LO2 with ambulation with sats appeared to maintain >86%.  Once back to room, sats on RA > 90% and O2 removed.  Continued to encourage incentive spirometer and had pt practice and educated him to perform 10 x hour.   Stairs Stairs: Yes Stairs assistance: Min guard Stair Management: Backwards, With walker, Step to pattern Number of Stairs: 2 General stair comments: Demonstrated up and down stairs wtih daughter  and wife and they demonstrrate ability to assist pt.   Wheelchair Mobility    Modified Rankin (Stroke Patients Only)       Balance Overall balance assessment: Needs  assistance Sitting-balance support: Bilateral upper extremity supported, Feet supported, No upper extremity supported Sitting balance-Leahy Scale: Fair Sitting balance - Comments: min guard assist, quick to fatigue Postural control: Posterior lean Standing balance support: Bilateral upper extremity supported Standing balance-Leahy Scale: Poor Standing balance comment: reliant on UE support in standing                            Cognition Arousal/Alertness: Awake/alert Behavior During Therapy: WFL for tasks assessed/performed Overall Cognitive Status: Impaired/Different from baseline Area of Impairment: Attention, Memory, Following commands, Safety/judgement, Problem solving, Awareness                 Orientation Level: Disoriented to, Time, Situation Current Attention Level: Sustained Memory: Decreased short-term memory Following Commands: Follows one step commands consistently, Follows one step commands with increased time Safety/Judgement: Decreased awareness of safety, Decreased awareness of deficits Awareness: Intellectual Problem Solving: Slow processing, Requires verbal cues, Requires tactile cues General Comments: pleasant, participatory, some decreased insight into (balance) deficits. follows commands consistently but also benefits from safety cues for DME use        Exercises General Exercises - Lower Extremity Ankle Circles/Pumps: AROM, Both, 10 reps, Seated Long Arc Quad: AROM, Both, 10 reps, Seated Other Exercises Other Exercises: Incentive spirometer x 10 reps an hour    General Comments        Pertinent Vitals/Pain Pain Assessment Pain Assessment: No/denies pain    Home Living                          Prior Function            PT Goals (current goals can now be found in the care plan section) Acute Rehab PT Goals Patient Stated Goal: be able to return home Progress towards PT goals: Progressing toward goals     Frequency    Min 3X/week      PT Plan Discharge plan needs to be updated    Co-evaluation              AM-PAC PT "6 Clicks" Mobility   Outcome Measure  Help needed turning from your back to your side while in a flat bed without using bedrails?: A Little Help needed moving from lying on your back to sitting on the side of a flat bed without using bedrails?: A Little Help needed moving to and from a bed to a chair (including a wheelchair)?: A Little Help needed standing up from a chair using your arms (e.g., wheelchair or bedside chair)?: A Little Help needed to walk in hospital room?: A Little Help needed climbing 3-5 steps with a railing? : A Lot 6 Click Score: 17    End of Session Equipment Utilized During Treatment: Gait belt;Oxygen Activity Tolerance: Patient limited by fatigue Patient left: with family/visitor present;in chair;with call bell/phone within reach;with chair alarm set Nurse Communication: Mobility status PT Visit Diagnosis: Difficulty in walking, not elsewhere classified (R26.2);Muscle weakness (generalized) (M62.81);Other abnormalities of gait and mobility (R26.89)     Time: 1517-6160 PT Time Calculation (min) (ACUTE ONLY): 35 min  Charges:  $Gait Training: 8-22 mins $Therapeutic Exercise: 8-22 mins  [facility] M,PT Acute Rehab Services [place] 01/20/2022, 1:56 PM

## 2022-01-20 NOTE — Progress Notes (Signed)
Referring Physician(s): General surgery  Supervising Physician: Aletta Edouard  Patient Status:  [facility] - In-pt  Chief Complaint:  22 days s/p cholecystostomy drain placement  Subjective:  Excited to be discharging home today with home health Wife plus another family member at bedside. Wife (also a Marine scientist) expresses confidence in daily flushing of drain.   Allergies: Patient has no known allergies.  Medications: Prior to Admission medications   Medication Sig Start Date End Date Taking? Authorizing Provider  allopurinol (ZYLOPRIM) 100 MG tablet TAKE 2 TABLETS EVERY DAY (DOSE INCREASE) Patient taking differently: Take 200 mg by mouth daily. 05/27/21  Yes Axel Filler, MD  atorvastatin (LIPITOR) 40 MG tablet TAKE 1 TABLET EVERY DAY Patient taking differently: Take 40 mg by mouth daily. 08/14/21  Yes Axel Filler, MD  carvedilol (COREG) 6.25 MG tablet TAKE 1 TABLET TWICE DAILY WITH MEALS (NEED MD APPOINTMENT) Patient taking differently: Take 6.25 mg by mouth 2 (two) times daily with a meal. 11/19/21  Yes Crenshaw, Denice Bors, MD  furosemide (LASIX) 20 MG tablet TAKE 1 TABLET EVERY DAY Patient taking differently: Take 20 mg by mouth daily. 11/19/21  Yes Lelon Perla, MD  losartan (COZAAR) 50 MG tablet TAKE 1 TABLET EVERY DAY Patient taking differently: Take 50 mg by mouth daily. 03/20/21  Yes Lelon Perla, MD  naproxen sodium (ALEVE) 220 MG tablet Take 440 mg by mouth daily as needed (pain).   Yes [provider]  sodium chloride flush (NS) 0.9 % SOLN 5 mLs by Intracatheter route daily. 01/20/22  Yes Kash Mothershead, PA  warfarin (COUMADIN) 5 MG tablet Take 1-2 tablets by mouth daily or as directed by Coumadin clinic Patient taking differently: Take 2.5-5 mg by mouth daily. 2.5 mg daily on Sunday, Tuesday, Thursday, Saturday 5 mg daily on Monday, Wednesday, Friday 11/19/21  Yes Crenshaw, Denice Bors, MD     Vital Signs: BP (!) 135/96 (BP Location:  Left Arm)   Pulse (!) 56   Temp 97.8 F (36.6 C) (Oral)   Resp 18   Ht '5\' 6"'$  (1.676 m)   Wt 178 lb (80.7 kg)   SpO2 95%   BMI 28.73 kg/m   Physical Exam Constitutional:      General: He is not in acute distress.    Appearance: He is ill-appearing.  HENT:     Mouth/Throat:     Pharynx: Oropharynx is clear.  Eyes:     Extraocular Movements: Extraocular movements intact.  Cardiovascular:     Rate and Rhythm: Bradycardia present.  Pulmonary:     Effort: Pulmonary effort is normal.  Skin:    General: Skin is warm and dry.  Neurological:     General: No focal deficit present.     Mental Status: He is alert and oriented to person, place, and time.  Psychiatric:        Mood and Affect: Mood normal.        Behavior: Behavior normal.   Drain Location: RUQ Size: Fr size: 10 Fr Date of placement: 12/29/21  Currently to: Drain collection device: gravity 24 hour output:  Output by Drain (mL) 01/18/22 0700 - 01/18/22 1459 01/18/22 1500 - 01/18/22 2259 01/18/22 2300 - 01/19/22 0659 01/19/22 0700 - 01/19/22 1459 01/19/22 1500 - 01/19/22 2259 01/19/22 2300 - 01/20/22 0659 01/20/22 0700 - 01/20/22 1136  Biliary Tube Cook slip-coat 10.2 Fr. RUQ  125 70   175 50   Current examination: Flushes/aspirates easily.  Insertion site unremarkable. Suture  in place. Dressed appropriately.   Imaging: CT CYSTOGRAM ABD/PELVIS  Result Date: 01/18/2022 CLINICAL DATA:  Bladder perforation with recent repair. New wound dehiscence. EXAM: CT CYSTOGRAM (CT ABDOMEN AND PELVIS WITH CONTRAST) TECHNIQUE: Multi-detector CT imaging through the abdomen and pelvis was performed after dilute contrast had been introduced into the bladder for the purposes of performing CT cystography. RADIATION DOSE REDUCTION: This exam was performed according to the departmental dose-optimization program which includes automated exposure control, adjustment of the mA and/or kV according to patient size and/or use of iterative  reconstruction technique. CONTRAST:  65m OMNIPAQUE IOHEXOL 300 MG/ML SOLN, 1022mOMNIPAQUE IOHEXOL 300 MG/ML SOLN COMPARISON:  CT cystogram 01/04/2022.  Abdomen pelvis CT 01/03/2022. FINDINGS: Lower chest: Coronary artery calcification is evident. Dependent atelectasis. Hepatobiliary: No suspicious focal abnormality within the liver parenchyma. Percutaneous cholecystostomy tube again noted. No intrahepatic or extrahepatic biliary dilation. Pancreas: No focal mass lesion. No dilatation of the main duct. No intraparenchymal cyst. No peripancreatic edema. Spleen: No splenomegaly. No focal mass lesion. Adrenals/Urinary Tract: No adrenal nodule or mass. Tiny hypodensities in the right kidney are too small to characterize but likely benign cyst. There is no hydronephrosis. No hydroureter. Bladder is filled with gas and contrast material. Although no imaging was performed before administration of bladder contrast, there is apparent dissection of contrast material from the anterior bladder wall (79/3) with contrast material visible in the deep aspect of the midline wound packing. The linear focus of contrast extravasation from the anterior bladder wall is well demonstrated on sagittal image 96/7 with dissection through the rectus sheath into the deep midline wound visible on image 100/7. Foley catheter is visualized in the bladder lumen Stomach/Bowel: Stomach is unremarkable. No gastric wall thickening. No evidence of outlet obstruction. Duodenum is normally positioned as is the ligament of Treitz. Duodenal diverticulum noted. No small bowel wall thickening. No small bowel dilatation. The terminal ileum is normal. The appendix is normal. No gross colonic mass. No colonic wall thickening. Diverticular changes are noted in the left colon without evidence of diverticulitis. Vascular/Lymphatic: There is moderate atherosclerotic calcification of the abdominal aorta without aneurysm. There is no gastrohepatic or hepatoduodenal  ligament lymphadenopathy. No retroperitoneal or mesenteric lymphadenopathy. No pelvic sidewall lymphadenopathy. Reproductive: Prostate gland is enlarged. Other: No intraperitoneal free fluid. Much of the extraperitoneal fluid seen on the study from 01/04/2022 has resolved in the interval. No evidence for rim enhancing fluid collection in the pelvis to suggest abscess. Musculoskeletal: No worrisome lytic or sclerotic osseous abnormality. IMPRESSION: 1. Linear focus of contrast extravasation from the anterior bladder wall is best appreciated on sagittal reconstructions. Contrast material is seen tracking through the midline low rectus sheath into the deep aspect of the patient's open midline wound. As such, imaging features are compatible with bladder leak arising from the anterior bladder wall. 2. Much of the extraperitoneal fluid seen on the previous study has resolved. No substantial intraperitoneal free fluid on today's study. No rim enhancing fluid collection to suggest the presence of an abscess. 3. Percutaneous cholecystostomy tube again noted. Electronically Signed   By: ErMisty Stanley.D.   On: 01/18/2022 07:30   DG Swallowing Func-Speech Pathology  Result Date: 01/17/2022 Table formatting from the original result was not included. Objective Swallowing Evaluation: Type of Study: MBS-Modified Barium Swallow Study  Patient Details Name: PhXAIVER ROSKELLEYRN: [id number] of Birth: 2/January 31, 1947oday's Date: 01/17/2022 Time: SLP Start Time (ACUTE ONLY): 097829SLP Stop Time (ACUTE ONLY): 0940 SLP Time Calculation (min) (ACUTE ONLY): 20  min Past Medical History: Past Medical History: Diagnosis Date  Allergic rhinitis 08/08/2013  takes Loratadine daily   Aortic stenosis 09/08/2011  With mild aortic regurgitation, mean gradient 13 mmHg   Cataract   right but immature  Complication of anesthesia   stopped breathing - during shoulder surgery 2009-2010  Coronary artery disease 10/02/2009  Cardiac cath (October 2007): 20%  left main, 60% mid LAD, 60-70% distal LAD, 30-40% first diagonal, 30% circumflex, 40% OM, 30-40% RCA   Degenerative joint disease of shoulder 08/03/2013  Bilateral, s/p right total shoulder arthroplasty 05/29/2011 and left shoulder arthroplasty 97/35/3299  Diastolic dysfunction 24/07/6832  Echo (04/04/2016): Grade I  Diverticulosis 10/07/2013  Seen on colonoscopy in 2007   Erectile dysfunction 05/07/2009  Essential hypertension 08/03/2013  had been on Lisinopril but stopped per MD  First degree AV block 03/14/2016  Gastric ulcer 10/07/2013  Seen on EGD 04/10/2006   Hemorrhoids 10/07/2013  Hyperlipidemia 10/02/2009  Paroxysmal atrial fibrillation ([place]) 07/14/2006  One episode, provoked by alcohol use disorder, briefly anticoagulated with warfarin, then switched to aspirin alone  Sciatica associated with disorder of lumbar spine 08/03/2013  Anterolisthesis with right L 4-5 nerve root compression.  Treated with epidural injections and Physical Therapy.   Seborrheic keratosis 10/07/2013  Tubular adenoma of colon  Past Surgical History: Past Surgical History: Procedure Laterality Date  CARDIAC CATHETERIZATION  2007  CARDIOVERSION    CARDIOVERSION N/A 08/04/2019  Procedure: CARDIOVERSION;  Surgeon: Fay Records, MD;  Location: [place];  Service: Cardiovascular;  Laterality: N/A;  COLONOSCOPY    CYSTOSCOPY N/A 01/04/2022  Procedure: Consuela Mimes;  Surgeon: Raynelle Bring, MD;  Location: [place];  Service: Urology;  Laterality: N/A;  INSERTION OF MESH N/A 03/11/2019  Procedure: Insertion Of Mesh;  Surgeon: Ralene Ok, MD;  Location: [place];  Service: General;  Laterality: N/A;  IR PERC CHOLECYSTOSTOMY  12/29/2021  JOINT REPLACEMENT    KNEE ARTHROSCOPY WITH MENISCAL REPAIR Right 01/30/2011  LAPAROTOMY N/A 01/04/2022  Procedure: EXPLORATORY LAPAROTOMY WITH EXTRAPERITONEAL BLADDER REPAIR;  Surgeon: Raynelle Bring, MD;  Location: [place];  Service: Urology;  Laterality: N/A;  right finger surgery    as a child  TONSILLECTOMY    TOTAL KNEE  ARTHROPLASTY Left 07/28/2017  Procedure: LEFT TOTAL KNEE ARTHROPLASTY;  Surgeon: Renette Butters, MD;  Location: [place];  Service: Orthopedics;  Laterality: Left;  TOTAL SHOULDER ARTHROPLASTY  05/29/2011  Procedure: TOTAL SHOULDER ARTHROPLASTY;  Surgeon: Metta Clines Supple;  Location: [place];  Service: Orthopedics;  Laterality: Right;  TOTAL SHOULDER ARTHROPLASTY Left 06/08/2014  DR SUPPLE  TOTAL SHOULDER ARTHROPLASTY Left 06/08/2014  Procedure: LEFT TOTAL SHOULDER ARTHROPLASTY;  Surgeon: Marin Shutter, MD;  Location: [place];  Service: Orthopedics;  Laterality: Left;  UMBILICAL HERNIA REPAIR N/A 03/11/2019  Procedure: LAPAROSCOPIC UMBILICAL HERNIA;  Surgeon: Ralene Ok, MD;  Location: [place];  Service: General;  Laterality: N/A; HPI: 76 yo admitted 7/1 with malaise, Abd pain and cholecystitis. 7/2 IR placed perc chole drain with intubation same date and sepsis. 7/8 pt with extraperitoneal bladder rupture to OR for ex lap, cystotomy with bladder repair. Intubated 7/2-7/14 PMHx: HLD, CAD, AS, HTN, gout, Afib  No data recorded  Recommendations for follow up therapy are one component of a multi-disciplinary discharge planning process, led by the attending physician.  Recommendations may be updated based on patient status, additional functional criteria and insurance authorization. Assessment / Plan / Recommendation   01/17/2022  10:20 AM Clinical Impressions Clinical Impression Pt presents with oropharyngeal dysphagia characterized by reduction  in bolus cohesion, tongue base retraction, and pharyngeal constriction. He demonstrated vallecular residue, and posterior pharyngeal wall residue. Residue was improved with reduced bolus sizes and with a liquid wash. Penetration (PAS 5) and aspiration (PAS 7, 8) were noted with thin liquids. Throat clearing and delayed coughing were inconsistently noted. Prompted coughing did propel the aspirate above the vocal folds, but was ineffective in expelling it from the larynx and it was  subsequently aspirated again. Penetration and aspiration were eliminated with use of a chin tuck posture and avoidance of straws. A regular texture diet with thin liquids is recommended at this time with strict observance of swallowing precautions. SLP will follow for dysphagia treatment. SLP Visit Diagnosis Dysphagia, oropharyngeal phase (R13.12) Impact on safety and function Mild aspiration risk     01/17/2022  10:20 AM Treatment Recommendations Treatment Recommendations Therapy as outlined in treatment plan below     01/17/2022  10:20 AM Prognosis Prognosis for Safe Diet Advancement Good   01/17/2022  10:20 AM Diet Recommendations SLP Diet Recommendations Regular solids;Thin liquid Liquid Administration via Cup;No straw Medication Administration Whole meds with puree Compensations Slow rate;Small sips/bites;Follow solids with liquid;Chin tuck Postural Changes Seated upright at 90 degrees     01/17/2022  10:20 AM Other Recommendations Oral Care Recommendations Oral care BID Follow Up Recommendations Skilled nursing-short term rehab (<3 hours/day) Functional Status Assessment Patient has had a recent decline in their functional status and demonstrates the ability to make significant improvements in function in a reasonable and predictable amount of time.   01/17/2022  10:20 AM Frequency and Duration  Speech Therapy Frequency (ACUTE ONLY) min 2x/week Treatment Duration 2 weeks     01/17/2022  10:20 AM Oral Phase Oral Phase Impaired Oral - Nectar Cup Decreased bolus cohesion;Premature spillage Oral - Nectar Straw Decreased bolus cohesion;Premature spillage Oral - Thin Cup Decreased bolus cohesion;Premature spillage Oral - Thin Straw Decreased bolus cohesion;Premature spillage Oral - Puree WFL Oral - Regular [facility]    01/17/2022  10:20 AM Pharyngeal Phase Pharyngeal Phase Impaired Pharyngeal- Nectar Cup Reduced tongue base retraction;Pharyngeal residue - valleculae;Pharyngeal residue - posterior pharnyx;Reduced pharyngeal  peristalsis Pharyngeal- Nectar Straw Reduced tongue base retraction;Pharyngeal residue - valleculae;Pharyngeal residue - posterior pharnyx;Reduced pharyngeal peristalsis Pharyngeal- Thin Cup Reduced tongue base retraction;Pharyngeal residue - valleculae;Pharyngeal residue - posterior pharnyx;Reduced pharyngeal peristalsis;Penetration/Aspiration before swallow;Penetration/Aspiration during swallow Pharyngeal Material enters airway, CONTACTS cords and not ejected out;Material enters airway, passes BELOW cords and not ejected out despite cough attempt by patient;Material enters airway, passes BELOW cords without attempt by patient to eject out (silent aspiration) Pharyngeal- Thin Straw Reduced tongue base retraction;Pharyngeal residue - valleculae;Pharyngeal residue - posterior pharnyx;Reduced pharyngeal peristalsis;Penetration/Aspiration during swallow;Penetration/Aspiration before swallow Pharyngeal Material enters airway, CONTACTS cords and not ejected out;Material enters airway, passes BELOW cords without attempt by patient to eject out (silent aspiration);Material enters airway, passes BELOW cords and not ejected out despite cough attempt by patient Pharyngeal- Puree Reduced tongue base retraction;Pharyngeal residue - valleculae;Pharyngeal residue - posterior pharnyx;Reduced pharyngeal peristalsis Pharyngeal- Regular Reduced tongue base retraction;Pharyngeal residue - valleculae;Pharyngeal residue - posterior pharnyx;Reduced pharyngeal peristalsis Pharyngeal- Pill Reduced tongue base retraction;Pharyngeal residue - valleculae;Pharyngeal residue - posterior pharnyx;Reduced pharyngeal peristalsis    01/17/2022  10:20 AM Cervical Esophageal Phase  Cervical Esophageal Phase [facility] Shanika I. Hardin Negus, [place], [place] Office number [phone] Pager [facility] 01/17/2022, 10:32 AM                      Labs:  CBC:  Recent Labs    01/17/22 0425 01/18/22 0140  01/19/22 0250 01/20/22 0118  WBC 11.3* 9.4 10.2 8.5  HGB 10.0* 9.7* 10.7* 10.5*  HCT 30.6* 29.0* 32.2* 30.8*  PLT 304 267 277 237    COAGS: Recent Labs    01/17/22 0425 01/18/22 0140 01/19/22 0250 01/20/22 0118  INR 1.7* 2.1* 2.3* 2.4*    BMP: Recent Labs    01/17/22 0425 01/18/22 0140 01/19/22 0250 01/20/22 0118  NA 140 137 136 137  K 3.7 3.7 3.1* 4.4  CL 105 104 106 108  CO2 25 26 21* 17*  GLUCOSE 96 95 77 77  BUN '17 16 13 13  '$ CALCIUM 8.0* 7.7* 7.6* 7.8*  CREATININE 1.14 1.29* 1.11 1.06  GFRNONAA >60 58* >60 >60    LIVER FUNCTION TESTS: Recent Labs    12/28/21 0943 01/02/22 0431 01/06/22 0920 01/12/22 0116  BILITOT 2.3* 1.0 1.2 1.1  AST '28 24 21 '$ 35  ALT '13 11 14 18  '$ ALKPHOS 67 43 46 46  PROT 7.0 6.2* 5.7* 6.1*  ALBUMIN 3.0* 2.3* 2.3* 2.3*    Assessment and Plan:  Acute cholecystitis --22 days s/p cholecystostomy tube --draining well --patient expects imminent discharge --Saline flushes sent to [place] wife how to flush drain who expresses confidence in performing herself at home.  She plans to keep up with dressing changes as well. --follow up order placed --Encouraged securing surgical follow up appointment as well.    Electronically Signed: Pasty Spillers, PA 01/20/2022, 11:32 AM   I spent a total of 25 Minutes at the the patient's bedside AND on the patient's hospital floor or unit, greater than 50% of which was counseling/coordinating care for chole tube.

## 2022-01-20 NOTE — Progress Notes (Addendum)
16 Days Post-Op   Subjective/Chief Complaint:  1 - Bladder Rupture - s/p open repair of bladder rupture 01/03/22 by Alinda Money. JP Cr 1.4 7/13 (same as serum). JP removed 7/15. Urine remains clear yellow today  No acute events overnight. Wound well appearing this AM, packed with wet to dry. Foley continues to drain clear yellow urine.   Objective: Vital signs in last 24 hours: Temp:  [97.5 F (36.4 C)-98.2 F (36.8 C)] 97.8 F (36.6 C) (07/24 0749) Pulse Rate:  [56-87] 56 (07/24 0749) Resp:  [18-24] 18 (07/24 0749) BP: (133-149)/(63-96) 135/96 (07/24 0749) SpO2:  [95 %-99 %] 95 % (07/24 0749) Weight:  [80.7 kg] 80.7 kg (07/24 0500) Last BM Date : 01/16/22  Intake/Output from previous day: 07/23 0701 - 07/24 0700 In: 1085 [P.O.:1080] Out: 26 [Urine:900; Drains:175] Intake/Output this shift: Total I/O In: 220 [P.O.:220] Out: 250 [Urine:200; Drains:50]  Stigmata of chonic disease, frail, no acute distress Some use of accessory muscles with breathing Obese abdomen, midline incision open packed with wet to dry dressing. Chole drain I place with dark green non-foul fluid.  Foley with clear urine, no clots, yellow  Lab Results:  Recent Labs    01/19/22 0250 01/20/22 0118  WBC 10.2 8.5  HGB 10.7* 10.5*  HCT 32.2* 30.8*  PLT 277 237    BMET Recent Labs    01/19/22 0250 01/20/22 0118  NA 136 137  K 3.1* 4.4  CL 106 108  CO2 21* 17*  GLUCOSE 77 77  BUN 13 13  CREATININE 1.11 1.06  CALCIUM 7.6* 7.8*    PT/INR Recent Labs    01/19/22 0250 01/20/22 0118  LABPROT 24.8* 26.0*  INR 2.3* 2.4*    ABG No results for input(s): "PHART", "HCO3" in the last 72 hours.  Invalid input(s): "PCO2", "PO2"  Studies/Results: Personally reviewed  Anti-infectives: Anti-infectives (From admission, onward)    Start     Dose/Rate Route Frequency Ordered Stop   01/17/22 1700  cefTRIAXone (ROCEPHIN) 1 g in sodium chloride 0.9 % 100 mL IVPB  Status:  Discontinued        1  g 200 mL/hr over 30 Minutes Intravenous Every 24 hours 01/17/22 1614 01/17/22 1627   01/17/22 1700  piperacillin-tazobactam (ZOSYN) IVPB 3.375 g  Status:  Discontinued        3.375 g 12.5 mL/hr over 240 Minutes Intravenous Every 8 hours 01/17/22 1645 01/18/22 1044   01/04/22 0900  cefTRIAXone (ROCEPHIN) 1 g in sodium chloride 0.9 % 100 mL IVPB        1 g 200 mL/hr over 30 Minutes Intravenous Every 24 hours 01/04/22 0807 01/11/22 1930   01/01/22 1400  cefTRIAXone (ROCEPHIN) 2 g in sodium chloride 0.9 % 100 mL IVPB  Status:  Discontinued        2 g 200 mL/hr over 30 Minutes Intravenous Every 24 hours 01/01/22 0842 01/03/22 0755   12/28/21 1600  piperacillin-tazobactam (ZOSYN) IVPB 3.375 g  Status:  Discontinued        3.375 g 12.5 mL/hr over 240 Minutes Intravenous Every 8 hours 12/28/21 1419 01/01/22 0842   12/28/21 1600  cefOXitin (MEFOXIN) 2 g in sodium chloride 0.9 % 100 mL IVPB  Status:  Discontinued        2 g 200 mL/hr over 30 Minutes Intravenous To Radiology 12/28/21 1509 12/29/21 1437   12/28/21 1256  vancomycin variable dose per unstable renal function (pharmacist dosing)  Status:  Discontinued  Does not apply See admin instructions 12/28/21 1256 12/28/21 1419   12/28/21 1015  piperacillin-tazobactam (ZOSYN) IVPB 3.375 g        3.375 g 100 mL/hr over 30 Minutes Intravenous  Once 12/28/21 1001 12/28/21 1029   12/28/21 1015  vancomycin (VANCOREADY) IVPB 1750 mg/350 mL        1,750 mg 175 mL/hr over 120 Minutes Intravenous  Once 12/28/21 1003 12/28/21 1254       Assessment/Plan:  1 - Bladder Rupture - now s/p repair with persistent anterior leak on recent CT cystogram. - Will plan to manage with conservative management. Very small hole which we expect will resolve following period of decompression with foley catheter 2 - Midline dehiscence - On exam and CT scan, appears fascia is largely intact.  - continue wet-to-dry dressings, changed BID - No further inpatient  urologic needs at this time. We will coordinate repeat imaging and clinic follow-up in a few weeks for wound check and possible catheter removal. - Please no wound vac  Greatly appreciate IM team comanagment.    Florentina Addison 01/20/2022  I have seen and examined the patient and agree with the above assessment and plan.  Reviewed CT cystogram performed on Saturday with very small leak on the anterior aspect with contrast in the wound. No fascial dehiscence.  -Pt clinically looks great without pain, fevers.  -Wound is healing well with wet to dry dressings  -recommend foley catheter to gravity drainage. Will arrange for cystogram in the office in 3 weeks and followup with [place]. This has been ordered. -Continue wet to dry dressings. Do NOT proceed with a wound vac as this would lead to vesicocutaneous fistula.  Matt R. [facility]  Pager: [phone]

## 2022-01-20 NOTE — Progress Notes (Signed)
Discharge instructions given with wife and daughter at bedside. Wound care/dressing change supplies given (enough for 5 days). Pt transported off unit via [place] with all  belongings, including DME. He remains stable at baseline.

## 2022-01-20 NOTE — Discharge Instructions (Addendum)
Mr. Muzquiz, It was a pleasure taking care of you at [facility] were admitted for cholecystitis and required a stay in the ICU. You were too sick to go to surgery so a drain was placed in. You had a bladder rupture that required repair by urology. We are discharging you home now that you are doing better. You are going home with the foley, drain in your gallbladder, and supplemental oxygen. Please follow the following instructions:   1) Continue wound care and drain care as instructed  2) Follow up with the internal medicine clinic, urology, IR and surgery as scheduled 3) Take warfarin 2.5 mg daily until follow up with the INR clinic on Friday at 2:30 pm.  4) Continue taking all your medications as prescribed  Take care,  Dr. Linwood Dibbles, MD, [facility] Stay Proper nutrition can help your body recover from illness and injury.   Foods and beverages high in protein, vitamins, and minerals help rebuild muscle loss, promote healing, & reduce fall risk.   In addition to eating healthy foods, a nutrition shake is an easy, delicious way to get the nutrition you need during and after your hospital stay  It is recommended that you continue to drink 2 bottles per day of: Ensure Plus for at least 1 month (30 days) after your hospital stay   Tips for adding a nutrition shake into your routine: As allowed, drink one with vitamins or medications instead of water or juice Enjoy one as a tasty mid-morning or afternoon snack Drink cold or make a milkshake out of it Drink one instead of milk with cereal or snacks Use as a coffee creamer   Available at the following grocery stores and pharmacies:           * [facility] [phone]            For COUPONS visit: www.ensure.com/join or  [email or web address]-[email or web address]   Suggested Substitutions Ensure Plus = Boost Plus = Carnation Breakfast Essentials = Boost Compact Ensure Active Clear = Boost Breeze Glucerna Shake = Boost Glucose Control = Carnation Breakfast Essentials SUGAR FREE

## 2022-01-20 NOTE — Progress Notes (Signed)
Nutrition Follow-up RD working remotely.   DOCUMENTATION CODES:   Not applicable  INTERVENTION:  - continue Ensure TID. - will add smart phrase to AVS to encourage patient to drink 2 bottles of Ensure Plus, or similar, for at least 1 month after d/c,   NUTRITION DIAGNOSIS:   Increased nutrient needs related to acute illness, wound healing as evidenced by estimated needs. -updated, ongoing  GOAL:   Patient will meet greater than or equal to 90% of their needs -minimally met on average  MONITOR:   PO intake, Supplement acceptance, Labs, Weight trends, Skin   ASSESSMENT:   76 year old male who presented to the ED on 7/01 with abdominal pain. PMH of PAF, HLD, CAD, HTN. Pt admitted with sepsis secondary to acute cholecystitis.  Biliary tube placed on 7/2. Patient was extubated on 7/13. MBS done on 7/21.  Diet advanced from NPO to Heart Healthy, thin liquids on 7/22 and since that time he has been eating 50-75% at meals and accepting Ensure ~75% of the time offered.   Weight has been fluctuating throughout hospitalization and weight was -7 lb from 7/18-7/21 and then stable since that time.   Generalized edema documented in the edema section of flow sheet today; mild pitting to all extremities had been documented on 7/21.   Radiology PA's note from late morning today outlines that patient's is excited to be d/c home with Home Health today.   TOC note indicates patient's Home Health agency will be [facility].    Labs reviewed; Ca: 7.8 mg/dl. Medications reviewed; 1 tablet multivitamin with minerals/day, 40 mEq Klor-Con x2 doses 7/23, 1 tablet senokot/day.   Diet Order:   Diet Order             Diet Heart Room service appropriate? Yes; Fluid consistency: Thin  Diet effective now                   EDUCATION NEEDS:   No education needs have been identified at this time  Skin:  Skin Assessment: Skin Integrity Issues: Skin Integrity Issues:: Stage II, Other  (Comment) Stage II: bilateral buttocks (newly documented 7/18) Other: MASD to bilateral perineal area and MARSI to L upper face (both newly documented on 7/13)  Last BM:  7/24 (type 6 x1, small amount) per secure chat discussion with RN  Height:   Ht Readings from Last 1 Encounters:  01/08/22 '5\' 6"'  (1.676 m)    Weight:   Wt Readings from Last 1 Encounters:  01/20/22 80.7 kg     BMI:  Body mass index is 28.73 kg/m.  Estimated Nutritional Needs:  Kcal:  1900-2100 Protein:  105-125 grams Fluid:  >/= 1.8 L     Jarome Matin, MS, RD, LDN, CNSC Registered Dietitian II Inpatient Clinical Nutrition RD pager # and on-call/weekend pager # available in [facility]

## 2022-01-20 NOTE — Plan of Care (Signed)

## 2022-01-21 ENCOUNTER — Telehealth: Payer: Self-pay

## 2022-01-21 NOTE — Patient Outreach (Signed)
  Care Coordination TOC Note Transition Care Management Follow-up Telephone Call Date of discharge and from where[facility]-01/20/22 How have you been since you were released from the hospital? Per spouse, patient is doing great, he slept well last night and his appetite is much better.   Any questions or concerns? No-Patient and spouse each have a daughter who is a Marine scientist and his daughter is coming this afternoon to assist him with walking.  Items Reviewed: Did the pt receive and understand the discharge instructions provided? Yes  Medications obtained and verified? Yes  Other? No  Any new allergies since your discharge? No  Dietary orders reviewed? Yes Do you have support at home? Yes -spouse and children  Home Care and Equipment/Supplies: Were home health services ordered? yes If so, what is the name of the agency? [facility]  Has the agency set up a time to come to the patient's home? yes Were any new equipment or medical supplies ordered?  Yes: Walker, Oxygen What is the name of the medical supply agency? [facility] Were you able to get the supplies/equipment? yes Do you have any questions related to the use of the equipment or supplies? No  Functional Questionnaire: (I = Independent and D = Dependent) ADLs: D  Bathing/Dressing- D  Meal Prep- D  Eating- I  Maintaining continence- D  Transferring/Ambulation- D  Managing Meds- D  Follow up appointments reviewed:  PCP Hospital f/u appt confirmed? Yes  Scheduled to see Dr. Irene Limbo [facility]) on 01/23/22 @ 0945. [facility] f/u appt confirmed? Yes  Scheduled to see Dr. Bobbye Morton on 02/20/22 @ 0830. Are transportation arrangements needed? No  If their condition worsens, is the pt aware to call PCP or go to the Emergency Dept.? Yes Was the patient provided with contact information for the PCP's office or ED? Yes Was to pt encouraged to call back with questions or concerns? Yes  SDOH assessments and interventions  completed:   Yes  Care Coordination Interventions Activated:  Yes Care Coordination Interventions:  Referred for Care Coordination Services:  RN Care Coordinator  Encounter Outcome:  Pt. Visit Completed

## 2022-01-21 NOTE — Patient Outreach (Signed)
  Care Coordination TOC Note Transition Care Management Unsuccessful Follow-up Telephone Call  Date of discharge and from where:  [name] 12/28/21-01/20/22  Attempts:  1st Attempt  Reason for unsuccessful TCM follow-up call:  Left voice message

## 2022-01-22 ENCOUNTER — Telehealth: Payer: Self-pay | Admitting: *Deleted

## 2022-01-22 DIAGNOSIS — E785 Hyperlipidemia, unspecified: Secondary | ICD-10-CM | POA: Diagnosis not present

## 2022-01-22 DIAGNOSIS — I350 Nonrheumatic aortic (valve) stenosis: Secondary | ICD-10-CM | POA: Diagnosis not present

## 2022-01-22 DIAGNOSIS — I4821 Permanent atrial fibrillation: Secondary | ICD-10-CM | POA: Diagnosis not present

## 2022-01-22 DIAGNOSIS — J9601 Acute respiratory failure with hypoxia: Secondary | ICD-10-CM | POA: Diagnosis not present

## 2022-01-22 DIAGNOSIS — Z4803 Encounter for change or removal of drains: Secondary | ICD-10-CM | POA: Diagnosis not present

## 2022-01-22 DIAGNOSIS — M170 Bilateral primary osteoarthritis of knee: Secondary | ICD-10-CM | POA: Diagnosis not present

## 2022-01-22 DIAGNOSIS — Z48815 Encounter for surgical aftercare following surgery on the digestive system: Secondary | ICD-10-CM | POA: Diagnosis not present

## 2022-01-22 DIAGNOSIS — I10 Essential (primary) hypertension: Secondary | ICD-10-CM | POA: Diagnosis not present

## 2022-01-22 DIAGNOSIS — I2510 Atherosclerotic heart disease of native coronary artery without angina pectoris: Secondary | ICD-10-CM | POA: Diagnosis not present

## 2022-01-22 NOTE — Chronic Care Management (AMB) (Signed)
  Care Management   Outreach Note  01/22/2022 Name: [name] MRN: [id number] DOB: 1945/08/28  An unsuccessful telephone outreach was attempted today. The patient was referred to the case management team for assistance with care management and care coordination.   Follow Up Plan:  The care management team will reach out to the patient again over the next 2 days.  If patient returns call to provider office, please advise to call [place]* at [phone].*  [place]  Direct Dial: [phone]

## 2022-01-23 ENCOUNTER — Other Ambulatory Visit: Payer: Self-pay | Admitting: Urology

## 2022-01-23 ENCOUNTER — Other Ambulatory Visit: Payer: Self-pay | Admitting: Student

## 2022-01-23 ENCOUNTER — Telehealth: Payer: Self-pay

## 2022-01-23 ENCOUNTER — Ambulatory Visit (INDEPENDENT_AMBULATORY_CARE_PROVIDER_SITE_OTHER): Payer: Medicare HMO | Admitting: Cardiology

## 2022-01-23 ENCOUNTER — Telehealth: Payer: Self-pay | Admitting: *Deleted

## 2022-01-23 ENCOUNTER — Ambulatory Visit (INDEPENDENT_AMBULATORY_CARE_PROVIDER_SITE_OTHER): Payer: Medicare HMO | Admitting: Student

## 2022-01-23 ENCOUNTER — Ambulatory Visit: Payer: Medicare HMO

## 2022-01-23 VITALS — BP 106/63 | HR 87 | Temp 97.5°F | Ht 66.0 in | Wt 171.8 lb

## 2022-01-23 DIAGNOSIS — Z5181 Encounter for therapeutic drug level monitoring: Secondary | ICD-10-CM

## 2022-01-23 DIAGNOSIS — N3289 Other specified disorders of bladder: Secondary | ICD-10-CM

## 2022-01-23 DIAGNOSIS — Z09 Encounter for follow-up examination after completed treatment for conditions other than malignant neoplasm: Secondary | ICD-10-CM | POA: Diagnosis not present

## 2022-01-23 DIAGNOSIS — J9601 Acute respiratory failure with hypoxia: Secondary | ICD-10-CM

## 2022-01-23 DIAGNOSIS — I4891 Unspecified atrial fibrillation: Secondary | ICD-10-CM

## 2022-01-23 DIAGNOSIS — K810 Acute cholecystitis: Secondary | ICD-10-CM

## 2022-01-23 DIAGNOSIS — Z4682 Encounter for fitting and adjustment of non-vascular catheter: Secondary | ICD-10-CM

## 2022-01-23 DIAGNOSIS — Z87891 Personal history of nicotine dependence: Secondary | ICD-10-CM | POA: Diagnosis not present

## 2022-01-23 NOTE — Telephone Encounter (Signed)
done

## 2022-01-23 NOTE — Telephone Encounter (Signed)
Received call from Katherine Mantle, RN with [facility]. Requesting VO for [place] 1 week 1, 2 week 2, and 1 week 6 for post-op wound care and catheter care. Verbal auth given. Will route to PCP for agreement/denial.

## 2022-01-23 NOTE — Patient Instructions (Addendum)
Thank you for coming into the clinic today! Glad to see you're feeling better!  Here's a quick summary of everything we talked about:  Please make an appointment for interventional radiology and urology for follow up We're checking some labs today, I will call you with the results when they're available For your Warfarin management, you don't need to go to your appointment tomorrow, but please give them a call and set up an appointment in the correct timeline.  If you have any questions please call the clinic at [phone]  Best, Dr. Marlene Lard Devoiry Corriher

## 2022-01-23 NOTE — Assessment & Plan Note (Signed)
Patient has a catheter and is excreting about 400 mL of urine a day according to his caretakers.  His caretakers also changed the wound daily, and have said that there is no leakage, drainage, bleeding, or recent fevers since discharge.  He is currently on finasteride 5 mg, and Mirabegron 25 mg.  He needs to schedule his follow-up with urology/surgery sometime within the next 2 weeks.  Plan: - Await urology follow-up

## 2022-01-23 NOTE — Assessment & Plan Note (Addendum)
Patient currently has drain in place, caretakers today it is draining about 200 mL a day.  Patient needs to set up appointment within next 4 weeks for interventional radiology in order to check/remove drain.  Plan: - Await interventional radiology appointment. -Plan for gallbladder removal

## 2022-01-23 NOTE — Telephone Encounter (Signed)
Sounds great.  Thank you. 

## 2022-01-23 NOTE — Progress Notes (Signed)
CC: Hospital F/U  HPI:  [name] is a 76 y.o. male living with a history stated below and presents today for a hospital follow up. Please see problem based assessment and plan for additional details.  Past Medical History:  Diagnosis Date   Allergic rhinitis 08/08/2013   takes Loratadine daily    Aortic stenosis 09/08/2011   With mild aortic regurgitation, mean gradient 13 mmHg    Cataract    right but immature   Complication of anesthesia    stopped breathing - during shoulder surgery 2009-2010   Coronary artery disease 10/02/2009   Cardiac cath (October 2007): 20% left main, 60% mid LAD, 60-70% distal LAD, 30-40% first diagonal, 30% circumflex, 40% OM, 30-40% RCA    Degenerative joint disease of shoulder 08/03/2013   Bilateral, s/p right total shoulder arthroplasty 05/29/2011 and left shoulder arthroplasty 11/94/1740   Diastolic dysfunction 81/09/4816   Echo (04/04/2016): Grade I   Diverticulosis 10/07/2013   Seen on colonoscopy in 2007    Erectile dysfunction 05/07/2009   Essential hypertension 08/03/2013   had been on Lisinopril but stopped per MD   First degree AV block 03/14/2016   Gastric ulcer 10/07/2013   Seen on EGD 04/10/2006    Hemorrhoids 10/07/2013   Hyperlipidemia 10/02/2009   Paroxysmal atrial fibrillation ([place]) 07/14/2006   One episode, provoked by alcohol use disorder, briefly anticoagulated with warfarin, then switched to aspirin alone   Sciatica associated with disorder of lumbar spine 08/03/2013   Anterolisthesis with right L 4-5 nerve root compression.  Treated with epidural injections and Physical Therapy.    Seborrheic keratosis 10/07/2013   Tubular adenoma of colon     Current Outpatient Medications on File Prior to Visit  Medication Sig Dispense Refill   atorvastatin (LIPITOR) 40 MG tablet Take 1 tablet (40 mg total) by mouth daily. 30 tablet 5   carvedilol (COREG) 6.25 MG tablet Take 1 tablet (6.25 mg total) by mouth 2 (two) times daily with a meal. 30  tablet 5   feeding supplement (ENSURE ENLIVE / ENSURE PLUS) LIQD Take 237 mLs by mouth 3 (three) times daily between meals. 237 mL 12   finasteride (PROSCAR) 5 MG tablet Take 1 tablet (5 mg total) by mouth daily. 30 tablet 5   mirabegron ER (MYRBETRIQ) 25 MG TB24 tablet Take 1 tablet (25 mg total) by mouth daily. 30 tablet 5   sodium chloride flush (NS) 0.9 % SOLN Flush 5 mLs by Intracatheter route daily. 300 mL 1   warfarin (COUMADIN) 5 MG tablet Take 0.5 mg tablets (2.5 mg total) daily until follow up with INR clinic 30 tablet 5   No current facility-administered medications on file prior to visit.    Family History  Problem Relation Age of Onset   Coronary artery disease Father 107       Died of myocardial infarction   Bradycardia Mother 43       Requiring pacemeaker placement   Coronary artery disease Brother 50       Died of myocardial infarction   Obesity Daughter    Healthy Son     Social History   Socioeconomic History   Marital status: Married    Spouse name: Not on file   Number of children: Not on file   Years of education: Not on file   Highest education level: Not on file  Occupational History   Not on file  Tobacco Use   Smoking status: Former    Packs/day:  1.50    Years: 42.00    Total pack years: 63.00    Types: Cigarettes    Start date: 53    Quit date: 07/14/2010    Years since quitting: 11.5   Smokeless tobacco: Never   Tobacco comments:    quit smoking about 91yr ago  Vaping Use   Vaping Use: Never used  Substance and Sexual Activity   Alcohol use: No    Comment: no alcohol since 07   Drug use: No   Sexual activity: Never    Birth control/protection: None  Other Topics Concern   Not on [place]  Former [place], early years/pre   Social Determinants of Health   Financial Resource Strain: Not on file  Food Insecurity: Not on file  Transportation  Needs: Not on file  Physical Activity: Not on file  Stress: Not on file  Social Connections: Not on file  Intimate Partner Violence: Not on file    Review of Systems: ROS negative except for what is noted on the assessment and plan.  Vitals:   01/23/22 1019  BP: 106/63  Pulse: 87  Temp: (!) 97.5 F (36.4 C)  TempSrc: Oral  SpO2: 100%  Weight: 171 lb 12.8 oz (77.9 kg)  Height: '5\' 6"'$  (1.676 m)    Physical Exam: Constitutional: well-appearing male, sitting in a wheelchair with biliary stent and catheter,  in no acute distress HENT: normocephalic atraumatic, mucous membranes moist Eyes: conjunctiva non-erythematous Neck: supple Cardiovascular: regular rate and rhythm, no m/r/g Pulmonary/Chest: normal work of breathing on room air, lungs clear to auscultation bilaterally Abdominal: soft, non-tender, non-distended MSK: normal bulk and tone Neurological: alert & oriented x 3, 5/5 strength in bilateral upper and lower extremities, normal gait Skin: warm and dry  Assessment & Plan:   Acute cholecystitis Patient currently has drain in place, caretakers today it is draining about 200 mL a day.  Patient needs to set up appointment within next 4 weeks for interventional radiology in order to check/remove drain.  Plan: - Await interventional radiology appointment. -Plan for gallbladder removal  Atrial fibrillation ([facility] Patient is currently on warfarin 2.5 mg, and was restarted on it after his discharge.  He initially had an appointment with the anticoagulation clinic for tomorrow, but given he received a recent INR of 2.4, I called the clinic and they said they did not need to see him tomorrow.  Plan:  - Advised patient and caretakers to call clinic back at their convenience to set up a normal routine schedule for INR checks.  Extraperitoneal rupture of bladder Patient has a catheter and is excreting about 400 mL of urine a day according to his caretakers.  His caretakers also  changed the wound daily, and have said that there is no leakage, drainage, bleeding, or recent fevers since discharge.  He is currently on finasteride 5 mg, and Mirabegron 25 mg.  He needs to schedule his follow-up with urology/surgery sometime within the next 2 weeks.  Plan: - Await urology follow-up  Acute respiratory failure with hypoxia ([facility] Patient presented the emergency department with septic shock secondary to acute cholecystitis on July 1.  He was also in respiratory failure at the time.  He was then intubated.  On July 7, hematuria was noted as well as AKI.  A CT scan was done which showed a ruptured bladder which was then repaired.  Patient was then discharged  on Monday.  Patient seen with Dr. Hayden Rasmussen Sorah Falkenstein, M.D. [facility], PGY-1 Phone: [phone] Date 01/23/2022 Time 2:24 PM

## 2022-01-23 NOTE — Assessment & Plan Note (Signed)
Patient is currently on warfarin 2.5 mg, and was restarted on it after his discharge.  He initially had an appointment with the anticoagulation clinic for tomorrow, but given he received a recent INR of 2.4, I called the clinic and they said they did not need to see him tomorrow.  Plan:  - Advised patient and caretakers to call clinic back at their convenience to set up a normal routine schedule for INR checks.

## 2022-01-23 NOTE — Assessment & Plan Note (Addendum)
Patient presented the emergency department with septic shock secondary to acute cholecystitis on July 1.  He was also in respiratory failure at the time.  He was then intubated.  On July 7, hematuria was noted as well as AKI.  A CT scan was done which showed a ruptured bladder which was then repaired.  Patient was then discharged on Monday.

## 2022-01-24 LAB — BMP8+ANION GAP
Anion Gap: 17 mmol/L (ref 10.0–18.0)
BUN/Creatinine Ratio: 14 (ref 10–24)
BUN: 15 mg/dL (ref 8–27)
CO2: 18 mmol/L — ABNORMAL LOW (ref 20–29)
Calcium: 8.5 mg/dL — ABNORMAL LOW (ref 8.6–10.2)
Chloride: 101 mmol/L (ref 96–106)
Creatinine, Ser: 1.1 mg/dL (ref 0.76–1.27)
Glucose: 106 mg/dL — ABNORMAL HIGH (ref 70–99)
Potassium: 4.3 mmol/L (ref 3.5–5.2)
Sodium: 136 mmol/L (ref 134–144)
eGFR: 70 mL/min/{1.73_m2} (ref >59)

## 2022-01-25 NOTE — Progress Notes (Signed)
Internal Medicine Clinic Attending  I saw and evaluated the patient.  I personally confirmed the key portions of the history and exam documented by Dr. Nooruddin and I reviewed pertinent patient test results.  The assessment, diagnosis, and plan were formulated together and I agree with the documentation in the resident's note.  

## 2022-01-27 ENCOUNTER — Telehealth: Payer: Self-pay | Admitting: *Deleted

## 2022-01-27 NOTE — Telephone Encounter (Signed)
Call from [place] PT with [facility] requesting verbal order "Increase stamina/re-conditioning twice a week x 3 weeks then once a week x 5 weeks". She stated pt is not wearing oxygen and O2 sat was 98% thru out the session and they will continue to watch him. VO given - sent to doctor for approval or denial. Thanks

## 2022-01-28 ENCOUNTER — Ambulatory Visit (INDEPENDENT_AMBULATORY_CARE_PROVIDER_SITE_OTHER): Payer: Medicare HMO

## 2022-01-28 DIAGNOSIS — Z5181 Encounter for therapeutic drug level monitoring: Secondary | ICD-10-CM | POA: Diagnosis not present

## 2022-01-28 DIAGNOSIS — I4819 Other persistent atrial fibrillation: Secondary | ICD-10-CM

## 2022-01-28 LAB — POCT INR: INR: 3.1 — AB (ref 2.0–3.0)

## 2022-01-28 NOTE — Patient Instructions (Signed)
continue taking warfarin 1/2 tablet daily.  Remain consistent with leafy veggies. Recheck INR in 3 weeks. Coumadin Clinic [phone]. Eat greens tonight.

## 2022-01-28 NOTE — Telephone Encounter (Signed)
Sounds appropriate. Thank you.

## 2022-01-29 ENCOUNTER — Other Ambulatory Visit: Payer: Self-pay

## 2022-01-29 NOTE — Patient Outreach (Signed)
[facility]) Care Management  01/29/2022  MCKALE HAFFEY [birth year] [phone]   Telephone call to patient for care coordination.  Spoke with patient and explained the reason for the call.  Patient states he has people coming out to the home and has nurses in the family.  He declined further nurse outreach at this time.    Plan: RN CM will close case.    Jone Baseman, RN, MSN [facility] Care Management Care Management Coordinator Direct Line [phone] Toll Free: [phone]  Fax: [phone]

## 2022-01-29 NOTE — Chronic Care Management (AMB) (Signed)
  Care Coordination   Note   01/29/2022 Name: [name] MRN: [id number] DOB: 27-Sep-1945  [name] is a 76 y.o. year old male who sees Evette Doffing, Mallie Mussel, MD for primary care. I reached out to [name] by phone today to offer care coordination services.  [name] was given information about Care Coordination services today including:   The Care Coordination services include support from the care team which includes your Nurse Coordinator, Clinical Social Worker, or Pharmacist.  The Care Coordination team is here to help remove barriers to the health concerns and goals most important to you. Care Coordination services are voluntary, and the patient may decline or stop services at any time by request to their care team member.   Care Coordination Consent Status: [name] agreed to services and verbal consent obtained.   Follow up plan:  Telephone appointment with care coordination team member scheduled for:  01/29/22  Encounter Outcome:  Pt. Scheduled  [place]  Direct Dial: [phone]

## 2022-01-31 ENCOUNTER — Emergency Department (HOSPITAL_COMMUNITY)
Admission: EM | Admit: 2022-01-31 | Discharge: 2022-02-01 | Disposition: A | Discharge status: Home / Self Care | Payer: Medicare HMO | Attending: Emergency Medicine | Admitting: Emergency Medicine

## 2022-01-31 ENCOUNTER — Ambulatory Visit
Admission: EM | Admit: 2022-01-31 | Discharge: 2022-01-31 | Disposition: A | Discharge status: Home / Self Care | Payer: Medicare HMO

## 2022-01-31 ENCOUNTER — Other Ambulatory Visit: Payer: Self-pay

## 2022-01-31 DIAGNOSIS — N3289 Other specified disorders of bladder: Secondary | ICD-10-CM | POA: Diagnosis not present

## 2022-01-31 DIAGNOSIS — R319 Hematuria, unspecified: Secondary | ICD-10-CM | POA: Diagnosis not present

## 2022-01-31 DIAGNOSIS — Z7902 Long term (current) use of antithrombotics/antiplatelets: Secondary | ICD-10-CM | POA: Diagnosis not present

## 2022-01-31 DIAGNOSIS — N3001 Acute cystitis with hematuria: Secondary | ICD-10-CM | POA: Insufficient documentation

## 2022-01-31 DIAGNOSIS — K760 Fatty (change of) liver, not elsewhere classified: Secondary | ICD-10-CM | POA: Diagnosis not present

## 2022-01-31 DIAGNOSIS — N281 Cyst of kidney, acquired: Secondary | ICD-10-CM | POA: Diagnosis not present

## 2022-01-31 LAB — CBC WITH DIFFERENTIAL/PLATELET
Abs Immature Granulocytes: 0.07 10*3/uL (ref 0.00–0.07)
Basophils Absolute: 0.1 10*3/uL (ref 0.0–0.1)
Basophils Relative: 1 %
Eosinophils Absolute: 0.7 10*3/uL — ABNORMAL HIGH (ref 0.0–0.5)
Eosinophils Relative: 5 %
HCT: 32.2 % — ABNORMAL LOW (ref 39.0–52.0)
Hemoglobin: 10.4 g/dL — ABNORMAL LOW (ref 13.0–17.0)
Immature Granulocytes: 1 %
Lymphocytes Relative: 27 %
Lymphs Abs: 3.5 10*3/uL (ref 0.7–4.0)
MCH: 31 pg (ref 26.0–34.0)
MCHC: 32.3 g/dL (ref 30.0–36.0)
MCV: 96.1 fL (ref 80.0–100.0)
Monocytes Absolute: 0.7 10*3/uL (ref 0.1–1.0)
Monocytes Relative: 6 %
Neutro Abs: 8 10*3/uL — ABNORMAL HIGH (ref 1.7–7.7)
Neutrophils Relative %: 60 %
Platelets: 199 10*3/uL (ref 150–400)
RBC: 3.35 MIL/uL — ABNORMAL LOW (ref 4.22–5.81)
RDW: 16.1 % — ABNORMAL HIGH (ref 11.5–15.5)
WBC: 13.1 10*3/uL — ABNORMAL HIGH (ref 4.0–10.5)
nRBC: 0 % (ref 0.0–0.2)

## 2022-01-31 LAB — URINALYSIS, ROUTINE W REFLEX MICROSCOPIC
Bilirubin Urine: NEGATIVE
Glucose, UA: NEGATIVE mg/dL
Ketones, ur: NEGATIVE mg/dL
Nitrite: NEGATIVE
Protein, ur: 100 mg/dL — AB
RBC / HPF: >50 RBC/hpf — ABNORMAL HIGH (ref 0–5)
Specific Gravity, Urine: 1.019 (ref 1.005–1.030)
WBC, UA: >50 WBC/hpf — ABNORMAL HIGH (ref 0–5)
pH: 5 (ref 5.0–8.0)

## 2022-01-31 LAB — COMPREHENSIVE METABOLIC PANEL
ALT: 29 U/L (ref 0–44)
AST: 38 U/L (ref 15–41)
Albumin: 2.7 g/dL — ABNORMAL LOW (ref 3.5–5.0)
Alkaline Phosphatase: 53 U/L (ref 38–126)
Anion gap: 6 (ref 5–15)
BUN: 18 mg/dL (ref 8–23)
CO2: 24 mmol/L (ref 22–32)
Calcium: 8.7 mg/dL — ABNORMAL LOW (ref 8.9–10.3)
Chloride: 107 mmol/L (ref 98–111)
Creatinine, Ser: 1.03 mg/dL (ref 0.61–1.24)
GFR, Estimated: >60 mL/min (ref >60)
Glucose, Bld: 112 mg/dL — ABNORMAL HIGH (ref 70–99)
Potassium: 3.9 mmol/L (ref 3.5–5.1)
Sodium: 137 mmol/L (ref 135–145)
Total Bilirubin: 0.9 mg/dL (ref 0.3–1.2)
Total Protein: 6.3 g/dL — ABNORMAL LOW (ref 6.5–8.1)

## 2022-01-31 NOTE — ED Provider Triage Note (Signed)
Emergency Medicine Provider Triage Evaluation Note  KAIROS PANETTA , a 76 y.o. male  was evaluated in triage.  Pt complains of bloody urine. States he has had urinary catheter in place s/p bladder rupture during CBI while admitted for sepsis 2/2 cholecystitis. States he has been doing well until 2 days ago when began having sensation that his bladder was not emptying fully. States that he then had some cloudy urine which has progressed to pink tinged urine. Denies abdominal pain, fevers, nausea.   Review of Systems  Positive:  Negative:   Physical Exam  BP 126/69   Pulse 95   Temp 98.4 F (36.9 C)   Resp 16   SpO2 95%  Gen:   Awake, no distress   Resp:  Normal effort  MSK:   Moves extremities without difficulty  Other:  Abdomen rounded, soft, mild TTP. Urinary catheter with pink/red tinged urine  Medical Decision Making  Medically screening exam initiated at 8:35 PM.  Appropriate orders placed.  KAZUO DURNIL was informed that the remainder of the evaluation will be completed by another provider, this initial triage assessment does not replace that evaluation, and the importance of remaining in the ED until their evaluation is complete.     Mickie Hillier, PA-C 01/31/22 2039

## 2022-01-31 NOTE — ED Triage Notes (Signed)
Pt via POV c/o bloody urine first noticed yesterday. Per note pt requires hospital admission discharged with urinary catheter after bladder rupture. Denies abdominal pain, fevers, or trauma/injury.

## 2022-01-31 NOTE — ED Provider Notes (Signed)
[facility]    CSN: [id number] Arrival date & time: 01/31/22  1716      History   Chief Complaint Chief Complaint  Patient presents with   foley complication    HPI [name] is a 76 y.o. male.   Patient here today with daughter for evaluation of discolored urine that she first noticed today.  By today's visit he also has significant blood in his catheter and urine collection bag.  He was discharged from hospital about 10 days ago after a 09-XIP stay with complications including bladder rupture.  The history is provided by the patient.    Past Medical History:  Diagnosis Date   Allergic rhinitis 08/08/2013   takes Loratadine daily    Aortic stenosis 09/08/2011   With mild aortic regurgitation, mean gradient 13 mmHg    Cataract    right but immature   Complication of anesthesia    stopped breathing - during shoulder surgery 2009-2010   Coronary artery disease 10/02/2009   Cardiac cath (October 2007): 20% left main, 60% mid LAD, 60-70% distal LAD, 30-40% first diagonal, 30% circumflex, 40% OM, 30-40% RCA    Degenerative joint disease of shoulder 08/03/2013   Bilateral, s/p right total shoulder arthroplasty 05/29/2011 and left shoulder arthroplasty 38/25/0539   Diastolic dysfunction 76/12/3417   Echo (04/04/2016): Grade I   Diverticulosis 10/07/2013   Seen on colonoscopy in 2007    Erectile dysfunction 05/07/2009   Essential hypertension 08/03/2013   had been on Lisinopril but stopped per MD   First degree AV block 03/14/2016   Gastric ulcer 10/07/2013   Seen on EGD 04/10/2006    Hemorrhoids 10/07/2013   Hyperlipidemia 10/02/2009   Paroxysmal atrial fibrillation ([place]) 07/14/2006   One episode, provoked by alcohol use disorder, briefly anticoagulated with warfarin, then switched to aspirin alone   Sciatica associated with disorder of lumbar spine 08/03/2013   Anterolisthesis with right L 4-5 nerve root compression.  Treated with epidural injections and Physical  Therapy.    Seborrheic keratosis 10/07/2013   Tubular adenoma of colon     Patient Active Problem List   Diagnosis Date Noted   Pressure injury of skin 01/15/2022   Acute respiratory failure with hypoxia ([place]) 01/11/2022   Encounter for biliary drainage tube placement 01/11/2022   Hematuria 01/11/2022   Extraperitoneal rupture of bladder 01/11/2022   Leukocytosis 01/11/2022   Acute cholecystitis 12/28/2021   Dyspnea on exertion 07/29/2021   Acquired thrombophilia ([place]) 04/22/2019   Encounter for therapeutic drug monitoring 03/28/2019   Atrial fibrillation ([place]) 03/04/2019   Flat foot, acquired, left 01/24/2019   Gout 01/24/2019   Prediabetes 01/24/2019   Osteoarthritis of both knees 12/12/2014   Tubular adenoma 10/07/2013   Seborrheic keratosis 10/07/2013   Healthcare maintenance 10/07/2013   Allergic rhinitis 08/08/2013   Essential hypertension 08/03/2013   Sciatica associated with disorder of lumbar spine 08/03/2013   Aortic stenosis 09/08/2011   Hyperlipidemia 10/02/2009   CAD (coronary artery disease) 10/02/2009    Past Surgical History:  Procedure Laterality Date   CARDIAC CATHETERIZATION  2007   CARDIOVERSION     CARDIOVERSION N/A 08/04/2019   Procedure: CARDIOVERSION;  Surgeon: Fay Records, MD;  Location: [place];  Service: Cardiovascular;  Laterality: N/A;   COLONOSCOPY     CYSTOSCOPY N/A 01/04/2022   Procedure: Consuela Mimes;  Surgeon: Raynelle Bring, MD;  Location: [place];  Service: Urology;  Laterality: N/A;   INSERTION OF MESH N/A 03/11/2019   Procedure: Insertion Of  Mesh;  Surgeon: Ralene Ok, MD;  Location: [place];  Service: General;  Laterality: N/A;   IR PERC CHOLECYSTOSTOMY  12/29/2021   JOINT REPLACEMENT     KNEE ARTHROSCOPY WITH MENISCAL REPAIR Right 01/30/2011   LAPAROTOMY N/A 01/04/2022   Procedure: EXPLORATORY LAPAROTOMY WITH EXTRAPERITONEAL BLADDER REPAIR;  Surgeon: Raynelle Bring, MD;  Location: [place];  Service: Urology;  Laterality: N/A;   right  finger surgery     as a child   TONSILLECTOMY     TOTAL KNEE ARTHROPLASTY Left 07/28/2017   Procedure: LEFT TOTAL KNEE ARTHROPLASTY;  Surgeon: Renette Butters, MD;  Location: [place];  Service: Orthopedics;  Laterality: Left;   TOTAL SHOULDER ARTHROPLASTY  05/29/2011   Procedure: TOTAL SHOULDER ARTHROPLASTY;  Surgeon: Metta Clines Supple;  Location: [place];  Service: Orthopedics;  Laterality: Right;   TOTAL SHOULDER ARTHROPLASTY Left 06/08/2014   DR SUPPLE   TOTAL SHOULDER ARTHROPLASTY Left 06/08/2014   Procedure: LEFT TOTAL SHOULDER ARTHROPLASTY;  Surgeon: Marin Shutter, MD;  Location: [place];  Service: Orthopedics;  Laterality: Left;   UMBILICAL HERNIA REPAIR N/A 03/11/2019   Procedure: LAPAROSCOPIC UMBILICAL HERNIA;  Surgeon: Ralene Ok, MD;  Location: [place];  Service: General;  Laterality: N/A;       Home Medications    Prior to Admission medications   Medication Sig Start Date End Date Taking? Authorizing Provider  atorvastatin (LIPITOR) 40 MG tablet Take 1 tablet (40 mg total) by mouth daily. 01/20/22   Lacinda Axon, MD  carvedilol (COREG) 6.25 MG tablet Take 1 tablet (6.25 mg total) by mouth 2 (two) times daily with a meal. 01/20/22   Amponsah, Charisse March, MD  feeding supplement (ENSURE ENLIVE / ENSURE PLUS) LIQD Take 237 mLs by mouth 3 (three) times daily between meals. 01/20/22   Lacinda Axon, MD  finasteride (PROSCAR) 5 MG tablet Take 1 tablet (5 mg total) by mouth daily. 01/21/22   Lacinda Axon, MD  mirabegron ER (MYRBETRIQ) 25 MG TB24 tablet Take 1 tablet (25 mg total) by mouth daily. 01/21/22   Lacinda Axon, MD  sodium chloride flush (NS) 0.9 % SOLN Flush 5 mLs by Intracatheter route daily. 01/20/22   Boisseau, Angie Fava, PA  warfarin (COUMADIN) 5 MG tablet Take 0.5 mg tablets (2.5 mg total) daily until follow up with INR clinic 01/20/22   Lacinda Axon, MD    Family History Family History  Problem Relation Age of Onset   Coronary artery disease  Father 81       Died of myocardial infarction   Bradycardia Mother 5       Requiring pacemeaker placement   Coronary artery disease Brother 56       Died of myocardial infarction   Obesity Daughter    Healthy Son     Social History Social History   Tobacco Use   Smoking status: Former    Packs/day: 1.50    Years: 42.00    Total pack years: 63.00    Types: Cigarettes    Start date: 37    Quit date: 07/14/2010    Years since quitting: 11.5   Smokeless tobacco: Never   Tobacco comments:    quit smoking about 24yr ago  Vaping Use   Vaping Use: Never used  Substance Use Topics   Alcohol use: No    Comment: no alcohol since 07   Drug use: No     Allergies   Patient has no known allergies.  Review of Systems Review of Systems  Constitutional:  Negative for chills and fever.  Eyes:  Negative for discharge and redness.  Genitourinary:  Positive for hematuria.     Physical Exam Triage Vital Signs ED Triage Vitals  Enc Vitals Group     BP 01/31/22 1822 (!) 148/82     Pulse Rate 01/31/22 1822 90     Resp 01/31/22 1822 16     Temp 01/31/22 1822 98.9 F (37.2 C)     Temp Source 01/31/22 1822 Oral     SpO2 01/31/22 1822 96 %     Weight --      Height --      Head Circumference --      Peak Flow --      Pain Score 01/31/22 1831 0     Pain Loc --      Pain Edu? --      Excl. in [place]? --    No data found.  Updated Vital Signs BP (!) 148/82 (BP Location: Left Arm)   Pulse 90   Temp 98.9 F (37.2 C) (Oral)   Resp 16   SpO2 96%       Physical Exam Vitals and nursing note reviewed.  Constitutional:      General: He is not in acute distress.    Appearance: He is not ill-appearing.     Comments: Patient appears frail  HENT:     Head: Normocephalic and atraumatic.  Eyes:     Conjunctiva/sclera: Conjunctivae normal.  Cardiovascular:     Rate and Rhythm: Normal rate.  Pulmonary:     Effort: Pulmonary effort is normal.  Neurological:     Mental Status:  He is alert.  Psychiatric:        Mood and Affect: Mood normal.        Behavior: Behavior normal.        Thought Content: Thought content normal.      UC Treatments / Results  Labs (all labs ordered are listed, but only abnormal results are displayed) Labs Reviewed - No data to display  EKG   Radiology No results found.  Procedures Procedures (including critical care time)  Medications Ordered in UC Medications - No data to display  Initial Impression / Assessment and Plan / UC Course  I have reviewed the triage vital signs and the nursing notes.  Pertinent labs & imaging results that were available during my care of the patient were reviewed by me and considered in my medical decision making (see chart for details).    Recommended further evaluation in the emergency department given recent admission and bladder complications.  Daughter is agreeable to same and will transport him via POV.   Final Clinical Impressions(s) / UC Diagnoses   Final diagnoses:  Hematuria, unspecified type  Extraperitoneal rupture of bladder   Discharge Instructions   None    ED Prescriptions   None    PDMP not reviewed this encounter.   Francene Finders, PA-C 01/31/22 1843

## 2022-01-31 NOTE — ED Triage Notes (Signed)
Pt c/o urine changing from a yellow-ish sediment to reddish discoloration from indwelling catheter. Blood started today. Will see urology 1 week from today.

## 2022-02-01 ENCOUNTER — Emergency Department (HOSPITAL_COMMUNITY): Payer: Medicare HMO

## 2022-02-01 DIAGNOSIS — N281 Cyst of kidney, acquired: Secondary | ICD-10-CM | POA: Diagnosis not present

## 2022-02-01 DIAGNOSIS — K760 Fatty (change of) liver, not elsewhere classified: Secondary | ICD-10-CM | POA: Diagnosis not present

## 2022-02-01 LAB — PROTIME-INR
INR: 2.1 — ABNORMAL HIGH (ref 0.8–1.2)
Prothrombin Time: 23.7 seconds — ABNORMAL HIGH (ref 11.4–15.2)

## 2022-02-01 MED ORDER — CEPHALEXIN 500 MG PO CAPS: 500.0000 mg | ORAL_CAPSULE | Freq: Two times a day (BID) | ORAL | 0 refills | Status: DC

## 2022-02-01 MED ORDER — SODIUM CHLORIDE 0.9 % IV SOLN
1.0000 g | Freq: Once | INTRAVENOUS | Status: AC
  Administered 2022-02-01: 1 g via INTRAVENOUS
  Filled 2022-02-01: qty 10

## 2022-02-01 MED ORDER — IOHEXOL 350 MG/ML SOLN
100.0000 mL | Freq: Once | INTRAVENOUS | Status: AC | PRN
  Administered 2022-02-01: 100 mL via INTRAVENOUS

## 2022-02-01 NOTE — ED Provider Notes (Signed)
[facility] EMERGENCY DEPARTMENT Provider Note   CSN: [id number] Arrival date & time: 01/31/22  2008     History  Chief Complaint  Patient presents with   Hematuria    [name] is a 76 y.o. male.  Patient presents to the emergency department for evaluation of hematuria.  Patient has had a complex recent past medical history.  The was hospitalized for cholecystitis, received a cholecystostomy.  During that hospital stay he was noted to have a bladder rupture which was surgically repaired.  Patient was discharged with Foley catheter in place.  Patient reports that he started noticing right red blood in his urine tonight.  No abdominal pain noted.       Home Medications Prior to Admission medications   Medication Sig Start Date End Date Taking? Authorizing Provider  cephALEXin (KEFLEX) 500 MG capsule Take 1 capsule (500 mg total) by mouth 2 (two) times daily. 02/01/22  Yes Amanii Snethen, Gwenyth Allegra, MD  atorvastatin (LIPITOR) 40 MG tablet Take 1 tablet (40 mg total) by mouth daily. 01/20/22   Lacinda Axon, MD  carvedilol (COREG) 6.25 MG tablet Take 1 tablet (6.25 mg total) by mouth 2 (two) times daily with a meal. 01/20/22   Amponsah, Charisse March, MD  feeding supplement (ENSURE ENLIVE / ENSURE PLUS) LIQD Take 237 mLs by mouth 3 (three) times daily between meals. 01/20/22   Lacinda Axon, MD  finasteride (PROSCAR) 5 MG tablet Take 1 tablet (5 mg total) by mouth daily. 01/21/22   Lacinda Axon, MD  mirabegron ER (MYRBETRIQ) 25 MG TB24 tablet Take 1 tablet (25 mg total) by mouth daily. 01/21/22   Lacinda Axon, MD  sodium chloride flush (NS) 0.9 % SOLN Flush 5 mLs by Intracatheter route daily. 01/20/22   Boisseau, Angie Fava, PA  warfarin (COUMADIN) 5 MG tablet Take 0.5 mg tablets (2.5 mg total) daily until follow up with INR clinic 01/20/22   Lacinda Axon, MD      Allergies    Patient has no known allergies.    Review of Systems   Review of  Systems  Physical Exam Updated Vital Signs BP 127/66   Pulse 90   Temp 98.6 F (37 C) (Oral)   Resp 19   Ht '5\' 6"'$  (1.676 m)   Wt 78 kg   SpO2 98%   BMI 27.76 kg/m  Physical Exam Vitals and nursing note reviewed.  Constitutional:      General: He is not in acute distress.    Appearance: He is well-developed.  HENT:     Head: Normocephalic and atraumatic.     Mouth/Throat:     Mouth: Mucous membranes are moist.  Eyes:     General: Vision grossly intact. Gaze aligned appropriately.     Extraocular Movements: Extraocular movements intact.     Conjunctiva/sclera: Conjunctivae normal.  Cardiovascular:     Rate and Rhythm: Normal rate and regular rhythm.     Pulses: Normal pulses.     Heart sounds: Normal heart sounds, S1 normal and S2 normal. No murmur heard.    No friction rub. No gallop.  Pulmonary:     Effort: Pulmonary effort is normal. No respiratory distress.     Breath sounds: Normal breath sounds.  Abdominal:     Palpations: Abdomen is soft.     Tenderness: There is no abdominal tenderness. There is no guarding or rebound.     Hernia: No hernia is present.  Musculoskeletal:  General: No swelling.     Cervical back: Full passive range of motion without pain, normal range of motion and neck supple. No pain with movement, spinous process tenderness or muscular tenderness. Normal range of motion.     Right lower leg: No edema.     Left lower leg: No edema.  Skin:    General: Skin is warm and dry.     Capillary Refill: Capillary refill takes less than 2 seconds.     Findings: No ecchymosis, erythema, lesion or wound.     Comments: Abdominal wall defect  Neurological:     Mental Status: He is alert and oriented to person, place, and time.     GCS: GCS eye subscore is 4. GCS verbal subscore is 5. GCS motor subscore is 6.     Cranial Nerves: Cranial nerves 2-12 are intact.     Sensory: Sensation is intact.     Motor: Motor function is intact. No weakness or  abnormal muscle tone.     Coordination: Coordination is intact.  Psychiatric:        Mood and Affect: Mood normal.        Speech: Speech normal.        Behavior: Behavior normal.     ED Results / Procedures / Treatments   Labs (all labs ordered are listed, but only abnormal results are displayed) Labs Reviewed  COMPREHENSIVE METABOLIC PANEL - Abnormal; Notable for the following components:      Result Value   Glucose, Bld 112 (*)    Calcium 8.7 (*)    Total Protein 6.3 (*)    Albumin 2.7 (*)    All other components within normal limits  CBC WITH DIFFERENTIAL/PLATELET - Abnormal; Notable for the following components:   WBC 13.1 (*)    RBC 3.35 (*)    Hemoglobin 10.4 (*)    HCT 32.2 (*)    RDW 16.1 (*)    Neutro Abs 8.0 (*)    Eosinophils Absolute 0.7 (*)    All other components within normal limits  URINALYSIS, ROUTINE W REFLEX MICROSCOPIC - Abnormal; Notable for the following components:   Color, Urine RED (*)    APPearance CLOUDY (*)    Hgb urine dipstick LARGE (*)    Protein, ur 100 (*)    Leukocytes,Ua MODERATE (*)    RBC / HPF >50 (*)    WBC, UA >50 (*)    Bacteria, UA FEW (*)    All other components within normal limits  PROTIME-INR - Abnormal; Notable for the following components:   Prothrombin Time 23.7 (*)    INR 2.1 (*)    All other components within normal limits  URINE CULTURE    EKG None  Radiology CT ABDOMEN PELVIS W CONTRAST  Result Date: 02/01/2022 CLINICAL DATA:  Acute abdominal pain. Hematuria. Recent bladder rupture. EXAM: CT ABDOMEN AND PELVIS WITH CONTRAST TECHNIQUE: Multidetector CT imaging of the abdomen and pelvis was performed using the standard protocol following bolus administration of intravenous contrast. RADIATION DOSE REDUCTION: This exam was performed according to the departmental dose-optimization program which includes automated exposure control, adjustment of the mA and/or kV according to patient size and/or use of iterative  reconstruction technique. CONTRAST:  177m OMNIPAQUE IOHEXOL 350 MG/ML SOLN COMPARISON:  CT cystogram 01/18/2022, most recent abdominopelvic CT 01/03/2022 FINDINGS: Lower chest: Dependent hypoventilatory change. There are coronary artery calcifications. No pleural effusion. Hepatobiliary: Mild subjective hepatic steatosis. No focal hepatic lesion or intrahepatic collection. Percutaneous cholecystostomy tube  decompresses the gallbladder. No fluid collection along the course of the tube. No adjacent inflammation. No biliary dilatation. Pancreas: No ductal dilatation or inflammation. Spleen: Normal in size without focal abnormality. Adrenals/Urinary Tract: No adrenal nodule. No hydronephrosis. Homogeneous renal enhancement with symmetric excretion on delayed phase imaging. Calcification in the upper right kidney may be a nonobstructing stone or parenchymal calcification. Small right renal cysts and low-density renal lesions too small to accurately characterize. These are coronal further follow-up. Foley catheter decompresses the urinary bladder. Collapse bladder with possible wall thickening. Mild stranding adjacent to the bladder extending towards abdominal wall defect. No perivesicular collection. Stomach/Bowel: Decompressed stomach. Duodenal diverticulum. No small bowel obstruction or inflammation. Normal appendix. Moderate colonic stool burden. Diffuse colonic diverticulosis without focal diverticulitis. Vascular/Lymphatic: Advanced aortic atherosclerosis, no aneurysm. No acute vascular findings. There is small left external iliac nodes, subcentimeter. No enlarged lymph nodes in the abdomen or pelvis. Reproductive: Enlarged prostate spans 6.1 cm transverse. Other: Lower abdominal wall defect with packing material. No free air or free fluid. Musculoskeletal: Stable degenerative change in the lumbar spine. Avascular necrosis of the right femoral head. No acute osseous findings. IMPRESSION: 1. Collapsed urinary  bladder around Foley catheter with mild bladder wall thickening. Mild stranding adjacent to the bladder extending towards abdominal wall defect. Patient with known bladder rupture, which was assessed on CT cystogram most recently 01/18/2022. No perivesicular collection. 2. Percutaneous cholecystostomy tube decompresses the gallbladder. No fluid collection along the course of the tube. 3. Colonic diverticulosis without diverticulitis. 4. Enlarged prostate. 5. Avascular necrosis of the right femoral head. Aortic Atherosclerosis (ICD10-I70.0). Electronically Signed   By: Keith Rake M.D.   On: 02/01/2022 02:48    Procedures Procedures    Medications Ordered in ED Medications  cefTRIAXone (ROCEPHIN) 1 g in sodium chloride 0.9 % 100 mL IVPB (has no administration in time range)  iohexol (OMNIPAQUE) 350 MG/ML injection 100 mL (100 mLs Intravenous Contrast Given 02/01/22 0224)    ED Course/ Medical Decision Making/ A&P                           Medical Decision Making Amount and/or Complexity of Data Reviewed Labs: ordered. Radiology: ordered.  Risk Prescription drug management.   Patient presents with hematuria.  Patient does have complicating feature of recent bladder rupture.  He is on Coumadin because of valvular A-fib.  INR was checked and it is actually slightly subtherapeutic.  Initiation of antibiotics will likely increase the INR.  Urinalysis c/w infection.   Patient underwent CT scan to further evaluate.  No obvious abnormality noted on the CT scan, both bladder and gallbladder are decompressed indicating that drainage tubes are in place and there is no acute pathology.  Exam does not reveal any tenderness.  Patient will be discharged with antibiotics, follow-up with urology.  Return to the ER for any decreased urine output indicating Foley catheter blockage.        Final Clinical Impression(s) / ED Diagnoses Final diagnoses:  Acute cystitis with hematuria    Rx / DC  Orders ED Discharge Orders          Ordered    cephALEXin (KEFLEX) 500 MG capsule  2 times daily        02/01/22 0257              Orpah Greek, MD 02/01/22 [phone]

## 2022-02-01 NOTE — ED Notes (Addendum)
Patient verbalizes understanding of discharge instructions. Opportunity for questioning and answers were provided. Armband removed by staff, pt discharged from ED. wheeled out to lobby

## 2022-02-02 LAB — URINE CULTURE: Culture: >=100000 — AB

## 2022-02-03 ENCOUNTER — Other Ambulatory Visit: Payer: Self-pay | Admitting: Cardiology

## 2022-02-03 ENCOUNTER — Telehealth (HOSPITAL_BASED_OUTPATIENT_CLINIC_OR_DEPARTMENT_OTHER): Payer: Self-pay | Admitting: *Deleted

## 2022-02-03 ENCOUNTER — Telehealth: Payer: Self-pay | Admitting: *Deleted

## 2022-02-03 DIAGNOSIS — J9601 Acute respiratory failure with hypoxia: Secondary | ICD-10-CM

## 2022-02-03 NOTE — Telephone Encounter (Signed)
Call from pt who stated he was discharged from the hospital with oxygen. And he has not used it. His O2 sats are 98-99% on RA. He was unable to tell me which company the oxygen is from but was told they need an order faxed to them stating to discontinue oxygen. Fax# [phone].

## 2022-02-03 NOTE — Telephone Encounter (Signed)
Order placed. Thank you.

## 2022-02-03 NOTE — Telephone Encounter (Signed)
Please place order for Home Use Only DME Oxygen and write in comments, "Please d/c home oxygen." Will then notify [employer] to p/u the oxygen equipment. Thank you.

## 2022-02-03 NOTE — Telephone Encounter (Signed)
Post ED Visit - Positive Culture Follow-up: Unsuccessful Patient Follow-up  Culture assessed and recommendations reviewed by:  '[x]'$  Adria Dill, Pharm.D. '[]'$  Heide Guile, Pharm.D., BCPS AQ-ID '[]'$  Parks Neptune, Pharm.D., BCPS '[]'$  Alycia Rossetti, Pharm.D., BCPS '[]'$  [place], [place].D., BCPS, AAHIVP '[]'$  Legrand Como, Pharm.D., BCPS, AAHIVP '[]'$  Wynell Balloon, PharmD '[]'$  Vincenza Hews, PharmD, BCPS  Positive urine culture  Pt prescribed Cephalexin Plan: Stop Cephalexin. Symptom check. If symptoms persist, add Fluconazole '200mg'$  PO daily x 14 days  Unable to contact patient letter will be sent to address on file  [name] 02/03/2022, 4:17 PM

## 2022-02-03 NOTE — Progress Notes (Signed)
ED Antimicrobial Stewardship Positive Culture Follow Up   [name] is an 76 y.o. male who presented to [facility] on 01/31/2022 with a chief complaint of hematuria. Patient had a recent hospitalization for cholecystitis, during which he had bladder rupture, now s/p surgical repair. Patient was discharged with a foley in place, which was present when the patient presented to the ED. Notably patient is on Coumadin for Atrial fibrillation, though INR was subtherapeutic at the time of the encounter. Patient did not report dysuria or any other urinary symptoms at the time of his ED visit.   UA (via clean cattch -although foley reportedly in place): RBC>50, WBC>50, nitrate negative   Chief Complaint  Patient presents with   Hematuria    Recent Results (from the past 720 hour(s))  Culture, Respiratory w Gram Stain     Status: None   Collection Time: 01/06/22 12:59 PM   Specimen: Tracheal Aspirate; Respiratory  Result Value Ref Range Status   Specimen Description TRACHEAL ASPIRATE  Final   Special Requests NONE  Final   Gram Stain   Final    ABUNDANT WBC PRESENT, PREDOMINANTLY PMN RARE BUDDING YEAST SEEN RARE GRAM NEGATIVE RODS Performed at [facility], [place] [address], [place], [place] [address]    Culture FEW CANDIDA ALBICANS RARE STAPHYLOCOCCUS AUREUS   Final   Report Status 01/10/2022 FINAL  Final   Organism ID, Bacteria STAPHYLOCOCCUS AUREUS  Final      Susceptibility   Staphylococcus aureus - MIC*    CIPROFLOXACIN <=0.5 SENSITIVE Sensitive     ERYTHROMYCIN 0.5 SENSITIVE Sensitive     GENTAMICIN <=0.5 SENSITIVE Sensitive     OXACILLIN 0.5 SENSITIVE Sensitive     TETRACYCLINE 2 SENSITIVE Sensitive     VANCOMYCIN 1 SENSITIVE Sensitive     TRIMETH/SULFA <=10 SENSITIVE Sensitive     CLINDAMYCIN <=0.25 SENSITIVE Sensitive     RIFAMPIN <=0.5 SENSITIVE Sensitive     Inducible Clindamycin NEGATIVE Sensitive     * RARE STAPHYLOCOCCUS AUREUS  Culture, blood (Routine X 2) w  Reflex to ID Panel     Status: None   Collection Time: 01/06/22  4:59 PM   Specimen: BLOOD  Result Value Ref Range Status   Specimen Description BLOOD RIGHT ANTECUBITAL  Final   Special Requests   Final    BOTTLES DRAWN AEROBIC ONLY Blood Culture results may not be optimal due to an inadequate volume of blood received in culture bottles   Culture   Final    NO GROWTH 5 DAYS Performed at [facility], [place] [address] [place], [place] [address]    Report Status 01/11/2022 FINAL  Final  Culture, blood (Routine X 2) w Reflex to ID Panel     Status: None   Collection Time: 01/06/22  5:03 PM   Specimen: BLOOD RIGHT FOREARM  Result Value Ref Range Status   Specimen Description BLOOD RIGHT FOREARM  Final   Special Requests   Final    BOTTLES DRAWN AEROBIC AND ANAEROBIC Blood Culture results may not be optimal due to an inadequate volume of blood received in culture bottles   Culture   Final    NO GROWTH 5 DAYS Performed at [facility], [place] [address], [place],  [address]    Report Status 01/11/2022 FINAL  Final  Urine Culture     Status: Abnormal   Collection Time: 01/31/22  8:40 PM   Specimen: Urine, Clean Catch  Result Value Ref  Range Status   Specimen Description URINE, CLEAN CATCH  Final   Special Requests   Final    NONE Performed at [facility], [place] [address], [place], [place] [address]    Culture >=100,000 COLONIES/mL YEAST (A)  Final   Report Status 02/02/2022 FINAL  Final    '[x]'$  Treated with cephalexin, organism resistant to prescribed antimicrobial '[]'$  Patient discharged originally without antimicrobial agent and treatment is now indicated  New antibiotic prescription: STOP cephalexin. Ask patient if urinary symptoms/hematuria are still present. If symptoms are still present, START fluconazole 200 mg daily PO x 14 days.   ED Provider: Sharyn Lull, PA-C    Adria Dill, PharmD PGY-2 Infectious Diseases Resident  02/03/2022 11:13 AM  Monday -  Friday phone -  [phone] Saturday - Sunday phone - [phone]

## 2022-02-03 NOTE — Telephone Encounter (Signed)
CM sent to [place] to discontinue home oxygen.

## 2022-02-07 DIAGNOSIS — S3720XA Unspecified injury of bladder, initial encounter: Secondary | ICD-10-CM | POA: Diagnosis not present

## 2022-02-07 DIAGNOSIS — K5730 Diverticulosis of large intestine without perforation or abscess without bleeding: Secondary | ICD-10-CM | POA: Diagnosis not present

## 2022-02-07 DIAGNOSIS — N400 Enlarged prostate without lower urinary tract symptoms: Secondary | ICD-10-CM | POA: Diagnosis not present

## 2022-02-11 ENCOUNTER — Ambulatory Visit (INDEPENDENT_AMBULATORY_CARE_PROVIDER_SITE_OTHER): Payer: Medicare HMO | Admitting: *Deleted

## 2022-02-11 DIAGNOSIS — I4819 Other persistent atrial fibrillation: Secondary | ICD-10-CM

## 2022-02-11 DIAGNOSIS — Z5181 Encounter for therapeutic drug level monitoring: Secondary | ICD-10-CM | POA: Diagnosis not present

## 2022-02-11 LAB — POCT INR: INR: 1.5 — AB (ref 2.0–3.0)

## 2022-02-11 NOTE — Patient Instructions (Signed)
Description   Today and tomorrow take 1 tablet ('5mg'$ ) of Warfarin then continue taking warfarin 1/2 tablet (2.'5mg'$ ) daily. Remain consistent with leafy veggies. Recheck INR in 10 days. Coumadin Clinic [phone]. Eat greens tonight.

## 2022-02-13 DIAGNOSIS — R3914 Feeling of incomplete bladder emptying: Secondary | ICD-10-CM | POA: Diagnosis not present

## 2022-02-14 ENCOUNTER — Ambulatory Visit (HOSPITAL_COMMUNITY)
Admission: RE | Admit: 2022-02-14 | Discharge: 2022-02-14 | Disposition: A | Discharge status: Home / Self Care | Payer: Medicare HMO | Source: Ambulatory Visit | Attending: Interventional Radiology | Admitting: Interventional Radiology

## 2022-02-14 ENCOUNTER — Other Ambulatory Visit (HOSPITAL_COMMUNITY): Payer: Self-pay | Admitting: Interventional Radiology

## 2022-02-14 DIAGNOSIS — K810 Acute cholecystitis: Secondary | ICD-10-CM

## 2022-02-14 DIAGNOSIS — Z4682 Encounter for fitting and adjustment of non-vascular catheter: Secondary | ICD-10-CM

## 2022-02-14 DIAGNOSIS — Z434 Encounter for attention to other artificial openings of digestive tract: Secondary | ICD-10-CM | POA: Insufficient documentation

## 2022-02-14 HISTORY — PX: IR EXCHANGE BILIARY DRAIN: IMG6046

## 2022-02-14 MED ORDER — IOHEXOL 300 MG/ML  SOLN
25.0000 mL | Freq: Once | INTRAMUSCULAR | Status: AC | PRN
  Administered 2022-02-14: 10 mL

## 2022-02-14 MED ORDER — LIDOCAINE HCL 1 % IJ SOLN
INTRAMUSCULAR | Status: AC
  Filled 2022-02-14: qty 20

## 2022-02-20 ENCOUNTER — Ambulatory Visit (INDEPENDENT_AMBULATORY_CARE_PROVIDER_SITE_OTHER): Payer: Medicare HMO

## 2022-02-20 ENCOUNTER — Telehealth: Payer: Self-pay

## 2022-02-20 DIAGNOSIS — I4819 Other persistent atrial fibrillation: Secondary | ICD-10-CM | POA: Diagnosis not present

## 2022-02-20 DIAGNOSIS — K819 Cholecystitis, unspecified: Secondary | ICD-10-CM | POA: Diagnosis not present

## 2022-02-20 DIAGNOSIS — Z5181 Encounter for therapeutic drug level monitoring: Secondary | ICD-10-CM

## 2022-02-20 LAB — POCT INR: INR: 2 (ref 2.0–3.0)

## 2022-02-20 NOTE — Patient Instructions (Signed)
continue taking warfarin 1/2 tablet (2.'5mg'$ ) daily. Remain consistent with leafy veggies. Recheck INR in 4 weeks. Coumadin Clinic [phone].

## 2022-02-20 NOTE — Telephone Encounter (Signed)
Patient stated he does not need the oxygen tanks anymore and is requesting for someone to "come and pick the tanks up"

## 2022-02-24 ENCOUNTER — Encounter: Payer: Self-pay | Admitting: Student in an Organized Health Care Education/Training Program

## 2022-02-24 ENCOUNTER — Ambulatory Visit (INDEPENDENT_AMBULATORY_CARE_PROVIDER_SITE_OTHER): Payer: Medicare HMO | Admitting: Student in an Organized Health Care Education/Training Program

## 2022-02-24 VITALS — BP 139/59 | HR 74 | Temp 97.7°F | Ht 66.0 in | Wt 171.3 lb

## 2022-02-24 DIAGNOSIS — N3289 Other specified disorders of bladder: Secondary | ICD-10-CM

## 2022-02-24 DIAGNOSIS — B3741 Candidal cystitis and urethritis: Secondary | ICD-10-CM | POA: Diagnosis not present

## 2022-02-24 DIAGNOSIS — Z4682 Encounter for fitting and adjustment of non-vascular catheter: Secondary | ICD-10-CM

## 2022-02-24 DIAGNOSIS — N390 Urinary tract infection, site not specified: Secondary | ICD-10-CM | POA: Diagnosis not present

## 2022-02-24 NOTE — Assessment & Plan Note (Signed)
Patient continues to have a percutaneous cholecystostomy tube in place, no longer hooked up to continuous suction bulb.  He is feeling well, no ileocolic symptoms with eating.  He saw Dr. Bobbye Morton with [facility] surgery and they are tentatively planning for cholecystectomy with drain removal at that time in the near future.  His functional status is improving very nicely at home.  I think his functional status is adequate at this point to undergo surgery.  He has valvular atrial fibrillation anticoagulated on warfarin for primary prevention of stroke, can discontinue warfarin 4 days prior to surgery, no need for bridging therapy given low risk for ischemic event.

## 2022-02-24 NOTE — Assessment & Plan Note (Addendum)
Being managed with [facility], seems to be improving nicely.  Foley catheter was removed last week, Myrbetriq was discontinued at that time which is very reasonable.  He had some urinary symptoms and was in emergency department about 2 weeks ago, urine culture showed yeast probably colonization from the catheter.  He is having pretty minimal symptoms of urinary urgency and frequency, will recheck urinalysis right now.  His abdominal wound is healing very nicely by secondary intention, it is approximately a 2 cm round deep wound with no signs of infection.  We will continue with finasteride 5 mg daily.

## 2022-02-24 NOTE — Progress Notes (Signed)
Established Patient Office Visit  Subjective   Patient ID: [name], male    DOB: [birth year]  Age: 76 y.o. MRN: [id number]   HPI  76 year old person here for follow-up from recent hospitalization for acute cholecystitis complicated by sepsis and bladder rupture.  I was involved as his attending during this hospitalization.  He is doing extremely well since discharge, being well cared for by his family.  Wife is with him today.  He continues with home physical therapy, currently able to walk independently outside the house without issues.  No chest pain, angina, or dyspnea on exertion.  Has not tried to walk up steps, can do his own grocery shopping, drove himself to clinic today.  Uses a cane to steady himself for mobility.  No longer needing supplemental oxygen at home, ambulatory pulse oximetry was normal here in the clinic.  Good adherence with his medications, adherent with INR checks which are being managed by cardiology clinic.  Has had good follow-up with [facility], Foley catheter has been removed.  His wife and daughter are doing wound care for his suprapubic wound which seems to be healing nicely, no drainage, no discomfort in that area.    Objective:     BP (!) 139/59 (BP Location: Left Arm, Patient Position: Sitting, Cuff Size: Small)   Pulse 74   Temp 97.7 F (36.5 C) (Oral)   Ht '5\' 6"'$  (1.676 m)   Wt 171 lb 4.8 oz (77.7 kg)   SpO2 98%   BMI 27.65 kg/m    Physical Exam  General: Well-appearing man, no distress Neck: No thyromegaly, no lymphadenopathy Heart: Regular rate and rhythm, 3 out of 6 early systolic murmur Lungs: Unlabored, clear throughout Abd: Soft and nontender, 2 cm round deep wound at the suprapubic incision, no erythema, no discharge, no granulation tissue Ext: Sarcopenia, no edema Skin: Senile purpura and telogen effluvium present    Assessment & Plan:   Problem List Items Addressed This Visit       Unprioritized   Encounter for  biliary drainage tube placement    Patient continues to have a percutaneous cholecystostomy tube in place, no longer hooked up to continuous suction bulb.  He is feeling well, no ileocolic symptoms with eating.  He saw Dr. Bobbye Morton with [facility] surgery and they are tentatively planning for cholecystectomy with drain removal at that time in the near future.  His functional status is improving very nicely at home.  I think his functional status is adequate at this point to undergo surgery.  He has valvular atrial fibrillation anticoagulated on warfarin for primary prevention of stroke, can discontinue warfarin 4 days prior to surgery, no need for bridging therapy given low risk for ischemic event.      Extraperitoneal rupture of bladder    Being managed with [facility], seems to be improving nicely.  Foley catheter was removed last week, Myrbetriq was discontinued at that time which is very reasonable.  He had some urinary symptoms and was in emergency department about 2 weeks ago, urine culture showed yeast probably colonization from the catheter.  He is having pretty minimal symptoms of urinary urgency and frequency, will recheck urinalysis right now.  His abdominal wound is healing very nicely by secondary intention, it is approximately a 2 cm round deep wound with no signs of infection.  We will continue with finasteride 5 mg daily.      Other Visit Diagnoses     Candida cystitis    -  Primary   Relevant Orders   Urinalysis, Reflex Microscopic   Culture, Urine       No follow-ups on file.    Axel Filler, MD

## 2022-02-24 NOTE — Patient Instructions (Signed)
Thank you for seeing [place] today [name]! We are glad that you are doing well. Your BP today was 139/59.  We are not making any changes to your medications today. Please let us know if you have any issues getting your prescriptions.   We will get a urine culture today to see if you have a bladder infection and will call you with results.

## 2022-02-24 NOTE — Addendum Note (Signed)
Addended by: Lalla Brothers T on: 02/24/2022 01:53 PM   Modules accepted: Orders

## 2022-02-25 DIAGNOSIS — N401 Enlarged prostate with lower urinary tract symptoms: Secondary | ICD-10-CM | POA: Diagnosis not present

## 2022-02-25 DIAGNOSIS — R3914 Feeling of incomplete bladder emptying: Secondary | ICD-10-CM | POA: Diagnosis not present

## 2022-02-25 DIAGNOSIS — R351 Nocturia: Secondary | ICD-10-CM | POA: Diagnosis not present

## 2022-02-25 LAB — MICROSCOPIC EXAMINATION
Bacteria, UA: NONE SEEN
Casts: NONE SEEN /lpf
Epithelial Cells (non renal): NONE SEEN /hpf (ref 0–10)
RBC, Urine: >30 /hpf — AB (ref 0–2)
WBC, UA: >30 /hpf — AB (ref 0–5)

## 2022-02-25 LAB — URINALYSIS, ROUTINE W REFLEX MICROSCOPIC
Bilirubin, UA: NEGATIVE
Glucose, UA: NEGATIVE
Nitrite, UA: NEGATIVE
RBC, UA: NEGATIVE
Specific Gravity, UA: 1.021 (ref 1.005–1.030)
Urobilinogen, Ur: 1 mg/dL (ref 0.2–1.0)
pH, UA: 6 (ref 5.0–7.5)

## 2022-02-26 LAB — URINE CULTURE

## 2022-02-26 NOTE — Telephone Encounter (Signed)
Please see phone note from 02/03/22. CM was sent to [employer] that order was written to d/c home oxygen.   CM sent today for order status update.

## 2022-02-27 MED ORDER — SILODOSIN 8 MG PO CAPS: 8.0000 mg | ORAL_CAPSULE | Freq: Every day | ORAL | 3 refills | Status: AC

## 2022-02-27 NOTE — Addendum Note (Signed)
Addended by: Lalla Brothers T on: 02/27/2022 08:58 AM   Modules accepted: Orders

## 2022-03-04 ENCOUNTER — Other Ambulatory Visit: Payer: Medicare HMO

## 2022-03-04 NOTE — Progress Notes (Signed)
HPI: FU NICM improved by most recent echocardiogram and AS. He also has a history of coronary artery disease with cardiomyopathy out of proportion, alcohol use, now resolved and paroxysmal atrial fibrillation. Cardiac catheterization in October of 2007 showed a 20% left main, a 60% mid LAD and a 60-70% distal LAD. There was a 30-40% first diagonal. There was a 30% circumflex and a 40% OM. There was a 30-40% right coronary artery. Ejection fraction was 25-30%. Abdominal ultrasound in February 2012 showed no aneurysm. ABIs October 2017 normal. Patient found to have recurrent atrial fibrillation September 2020 that was asymptomatic. Pt underwent successful cardioversion February 2021. Last echocardiogram July 2023 showed normal LV function, moderate left atrial enlargement, mild mitral regurgitation, mild aortic insufficiency and moderate aortic stenosis (mean gradient 17.3 mmHg with valve area 1.1 cm).  Recently discharged following prolonged admission for acute cholecystitis managed with percutaneous drain, acute hypoxic respiratory failure and perforated bladder.  Since he was last seen, he has mild dyspnea on exertion but no orthopnea, PND, pedal edema, exertional chest pain, palpitations, syncope or bleeding.  Current Outpatient Medications  Medication Sig Dispense Refill   atorvastatin (LIPITOR) 40 MG tablet Take 1 tablet (40 mg total) by mouth daily. 30 tablet 5   carvedilol (COREG) 6.25 MG tablet Take 1 tablet (6.25 mg total) by mouth 2 (two) times daily with a meal. 30 tablet 5   feeding supplement (ENSURE ENLIVE / ENSURE PLUS) LIQD Take 237 mLs by mouth 3 (three) times daily between meals. 237 mL 12   finasteride (PROSCAR) 5 MG tablet Take 1 tablet (5 mg total) by mouth daily. 30 tablet 5   silodosin (RAPAFLO) 8 MG CAPS capsule Take 1 capsule (8 mg total) by mouth daily with breakfast. 30 capsule 3   sodium chloride flush (NS) 0.9 % SOLN Flush 5 mLs by Intracatheter route daily. 300 mL 1    warfarin (COUMADIN) 5 MG tablet TAKE 1/2 TABLET TO 1 TABLET DAILY BY MOUTH OR AS DIRECTED BY COUMADIN CLINIC 80 tablet 1   No current facility-administered medications for this visit.     Past Medical History:  Diagnosis Date   Allergic rhinitis 08/08/2013   takes Loratadine daily    Aortic stenosis 09/08/2011   With mild aortic regurgitation, mean gradient 13 mmHg    Cataract    right but immature   Complication of anesthesia    stopped breathing - during shoulder surgery 2009-2010   Coronary artery disease 10/02/2009   Cardiac cath (October 2007): 20% left main, 60% mid LAD, 60-70% distal LAD, 30-40% first diagonal, 30% circumflex, 40% OM, 30-40% RCA    Degenerative joint disease of shoulder 08/03/2013   Bilateral, s/p right total shoulder arthroplasty 05/29/2011 and left shoulder arthroplasty 53/29/9242   Diastolic dysfunction 68/08/4194   Echo (04/04/2016): Grade I   Diverticulosis 10/07/2013   Seen on colonoscopy in 2007    Erectile dysfunction 05/07/2009   Essential hypertension 08/03/2013   had been on Lisinopril but stopped per MD   First degree AV block 03/14/2016   Gastric ulcer 10/07/2013   Seen on EGD 04/10/2006    Hemorrhoids 10/07/2013   Hyperlipidemia 10/02/2009   Paroxysmal atrial fibrillation ([place]) 07/14/2006   One episode, provoked by alcohol use disorder, briefly anticoagulated with warfarin, then switched to aspirin alone   Sciatica associated with disorder of lumbar spine 08/03/2013   Anterolisthesis with right L 4-5 nerve root compression.  Treated with epidural injections and Physical Therapy.    Seborrheic  keratosis 10/07/2013   Tubular adenoma of colon     Past Surgical History:  Procedure Laterality Date   CARDIAC CATHETERIZATION  2007   CARDIOVERSION     CARDIOVERSION N/A 08/04/2019   Procedure: CARDIOVERSION;  Surgeon: Fay Records, MD;  Location: [place];  Service: Cardiovascular;  Laterality: N/A;   COLONOSCOPY     CYSTOSCOPY N/A 01/04/2022   Procedure:  Consuela Mimes;  Surgeon: Raynelle Bring, MD;  Location: [place];  Service: Urology;  Laterality: N/A;   INSERTION OF MESH N/A 03/11/2019   Procedure: Insertion Of Mesh;  Surgeon: Ralene Ok, MD;  Location: [place];  Service: General;  Laterality: N/A;   IR EXCHANGE BILIARY DRAIN  02/14/2022   IR PERC CHOLECYSTOSTOMY  12/29/2021   JOINT REPLACEMENT     KNEE ARTHROSCOPY WITH MENISCAL REPAIR Right 01/30/2011   LAPAROTOMY N/A 01/04/2022   Procedure: EXPLORATORY LAPAROTOMY WITH EXTRAPERITONEAL BLADDER REPAIR;  Surgeon: Raynelle Bring, MD;  Location: [place];  Service: Urology;  Laterality: N/A;   right finger surgery     as a child   TONSILLECTOMY     TOTAL KNEE ARTHROPLASTY Left 07/28/2017   Procedure: LEFT TOTAL KNEE ARTHROPLASTY;  Surgeon: Renette Butters, MD;  Location: [place];  Service: Orthopedics;  Laterality: Left;   TOTAL SHOULDER ARTHROPLASTY  05/29/2011   Procedure: TOTAL SHOULDER ARTHROPLASTY;  Surgeon: Metta Clines Supple;  Location: [place];  Service: Orthopedics;  Laterality: Right;   TOTAL SHOULDER ARTHROPLASTY Left 06/08/2014   DR SUPPLE   TOTAL SHOULDER ARTHROPLASTY Left 06/08/2014   Procedure: LEFT TOTAL SHOULDER ARTHROPLASTY;  Surgeon: Marin Shutter, MD;  Location: [place];  Service: Orthopedics;  Laterality: Left;   UMBILICAL HERNIA REPAIR N/A 03/11/2019   Procedure: LAPAROSCOPIC UMBILICAL HERNIA;  Surgeon: Ralene Ok, MD;  [facility];  Service: General;  Laterality: N/A;    Social History   Socioeconomic History   Marital status: Married    Spouse name: Not on file   Number of children: 2   Years of education: Not on file   Highest education level: Not on file  Occupational History   Occupation: Retired  Tobacco Use   Smoking status: Former    Packs/day: 1.50    Years: 42.00    Total pack years: 63.00    Types: Cigarettes    Start date: 1970    Quit date: 07/14/2010    Years since quitting: 11.6   Smokeless tobacco: Never   Tobacco comments:    quit smoking about  31yr ago  Vaping Use   Vaping Use: Never used  Substance and Sexual Activity   Alcohol use: No    Comment: no alcohol since 07   Drug use: No   Sexual activity: Never    Birth control/protection: None  Other Topics Concern   Not on file  [place]  Former [employer], early years/pre   Social Determinants of Health   Financial Resource Strain: Not on file  Food Insecurity: Not on file  Transportation Needs: Not on file  Physical Activity: Not on file  Stress: Not on file  Social Connections: Not on file  Intimate Partner Violence: Not on file    Family History  Problem Relation Age of Onset   Bradycardia Mother 915      Requiring pacemeaker placement   Coronary artery disease Father 640      Died of myocardial infarction  Coronary artery disease Brother 75       Died of myocardial infarction   Obesity Daughter    Healthy Son    Colon cancer Neg Hx    Esophageal cancer Neg Hx    Stomach cancer Neg Hx    Colon polyps Neg Hx     ROS: no fevers or chills, productive cough, hemoptysis, dysphasia, odynophagia, melena, hematochezia, dysuria, hematuria, rash, seizure activity, orthopnea, PND, pedal edema, claudication. Remaining systems are negative.  Physical Exam: Well-developed well-nourished in no acute distress.  Skin is warm and dry.  HEENT is normal.  Neck is supple.  Chest is clear to auscultation with normal expansion.  Cardiovascular exam is irregular.  3/6 systolic murmur left sternal border.  S2 is diminished. Abdominal exam nontender or distended. No masses palpated. Extremities show no edema. neuro grossly intact   A/P  1 aortic stenosis-moderate on most recent echocardiogram.  We will plan follow-up study in July 2024.  2 permanent atrial fibrillation-continue carvedilol and Coumadin with goal INR 2-3.  He has declined DOAC's due to expense.  3 coronary artery  disease-continue statin.  No aspirin given need for Coumadin.  4 hypertension-blood pressure controlled.  Continue present medications.  5 hyperlipidemia-continue statin.  6 previous cardiomyopathy-LV function normal on most recent echocardiogram.  7 preoperative evaluation prior to cholecystectomy-patient denies exertional chest pain.  Prior to his recent hospitalization he had excellent functional capacity with no chest pain.  His aortic stenosis is moderate on most recent echocardiogram.  He may proceed with surgery without further cardiac evaluation.  Kirk Ruths, MD

## 2022-03-06 ENCOUNTER — Telehealth: Payer: Self-pay | Admitting: Gastroenterology

## 2022-03-06 NOTE — Telephone Encounter (Signed)
I have some follow-up slots open 9/12 so you can schedule him with me then and will sort it out

## 2022-03-06 NOTE — Telephone Encounter (Signed)
Dr. Carlean Purl,  Can you please take a look at this urgent referral for a ERCP and advise scheduling.  Thanks

## 2022-03-06 NOTE — Telephone Encounter (Signed)
Pt was made aware of referral and Dr. Carlean Purl recommendations: Pt scheduled for 03/11/2022 at 8:50 to see Dr. Carlean Purl: Pt made aware: Address Provided; Phone number provided:  Pt verbalized understanding with all questions answered.

## 2022-03-06 NOTE — Telephone Encounter (Signed)
Urgent referral in [place] for ERCP, Planning laparoscopic cholecystectomy will need GI there for ERCP.  Records in [place].  Please advise scheduling?

## 2022-03-06 NOTE — Telephone Encounter (Signed)
Alyse Low this can go to Dr Carlean Purl, Caralyn Guile or Lyndel Safe as well as Dr Rush Landmark.  Dr Rush Landmark may not have any appts that are urgent.  Can this go to one of the other docs?

## 2022-03-06 NOTE — Telephone Encounter (Signed)
The patient has a cholecystostomy tube and choledocholithiasis and Dr. Bobbye Morton of [facility] is planning a cholecystectomy. As long as the tube is in gallbladder I do not think this is urgent but understand about trying to get it coordinated sooner rather than later.  There is also so some ? Of coordinating with urology.  So this is complicated and will require an office visit - could be an APP I suppose but needs OV to be seen and then we can try to arrange an ERCP   Patient also takes warfarin     This may be one where purple MD or yellow MD does it then lap chole - I am yellow late October and am willing to do it then so if they can see an APP this month that is a possibility

## 2022-03-06 NOTE — Telephone Encounter (Signed)
I agree, reasonable for evaluation in clinic.  If he is a high-risk individual and needs his procedures in the hospital as an observation/inpatient status, then coordination with surgery and whomever is the Yellow/Purple ERCP MD for that week will be key.  Especially if this will be combined together vs if it can be done as 2 separate procedures.  Recommendation, if 2 separate procedures that GI go first, so that the cholecystostomy tube stay in place until cholecystectomy is performed and we know the biliary tree has been accessed for stone removal.  CG, Let me know how I can be of assistance after your clinic visit if necessary. Thanks. GM

## 2022-03-10 NOTE — Progress Notes (Unsigned)
[name] 76 y.o. Jan 10, 1946 [id number]  Assessment & Plan:   Encounter Diagnoses  Name Primary?   Cholelithiasis with choledocholithiasis Yes   On warfarin therapy    PAF (paroxysmal atrial fibrillation) (HCC)    Aortic valve stenosis, etiology of cardiac valve disease unspecified    ERCP with common bile duct stone removal is appropriate prior to cholecystectomy.  Dr. Bobbye Morton is hoping to coordinate this and do it on the same day.  The patient is at higher risk of complication and problems due to his cardiac history and need for anticoagulation.  He is seeing cardiology in 2 days.  He would need to hold his warfarin 5 days prior to ERCP and presumably cholecystectomy as well.  He may need preoperative cardiology evaluation, defer to Dr. Stanford Breed.  He did have an echocardiogram in July with stable cardiac function.  Once we see preoperative cardiology evaluation and clearance to hold warfarin we will try to schedule for the week of October 23 when I have hospital time and Dr. Bobbye Morton does as well.  We have had some preliminary communication about this and I will follow-up to try to arrange this.  I have reviewed the risks benefits and indications of endoscopic procedures that include but are not limited to bleeding, infection, reactions to anesthesia, cardiac complications, stroke off warfarin in his case and the rare but real risk of pancreatitis due to ERCP as well.  Patient understands and agrees to proceed.  I appreciate the opportunity to care for this patient. CC: Axel Filler, MD Dr. Reather Laurence Dr. Kirk Ruths   Subjective:   Chief Complaint: Common bile duct stones  HPI 76 year old white man with a history of percutaneous cholecystostomy tube treatment of cholecystitis who is referred by Dr. Bobbye Morton for evaluation and treatment of common bile duct stones found at cholangiogram. Cholangiogram via cholecystostomy tube 02/14/2022 IMPRESSION: 1. Two  small filling defects present within the distal common bile duct consistent with nonobstructing choledocholithiasis. 2. Restored patency of the cystic duct. 3. Patent ampulla of Vater. 4. Successful cholecystostomy tube exchange.   He was hospitalized in July 2020 for with cholecystitis and the following issues:   a.  Acute cholecystitis: Status post cholecystectomy drain placed by IR. Please ensure drain is stable in place and being managed appropriately. Ensure patient follows up with IR for drain check.                          b.  Acute hypoxic respiratory failure: Patient discharged on 2 L [place]. Assess respiratory status and re-evaluate need for home oxygen.  Also reassess his need for his as needed Lasix.               c.  Perforated bladder: S/p repair by urology. Discharged home with a Foley and to continue wet-to-dry dressing on abdominal wound. Check abdominal wound and ensure patient is getting appropriate wound care and has follow-up with urology for cystogram in 3 weeks.               d.  Valvular A-fib: INR at discharge was 2.4. Patient discharged home on warfarin 2.5 mg.  Assess for any bleeding and ensure patient follows up with INR clinic.               e.  Hypertension: Patient blood pressure remained stable on Coreg alone.  Please check blood pressure and consider restarting his losartan if appropriate.  He has seen Dr. Bobbye Morton in follow-up and she plans cholecystectomy but wants bile duct cleared of stones prior.  He does not have any abdominal pain at this time no fever or chills.  His cholecystostomy tube is capped.  He knows to open the drain if he has fever pain etc.  His bladder problems are improving.  His Foley catheter is out.  He is due to see Dr. Stanford Breed for preoperative clearance in 2 days.  He takes warfarin for a history of paroxysmal atrial fibrillation he also has aortic stenosis and diastolic dysfunction.  He is able to lie flat without dyspnea he is not  describing any significant dyspnea problems or chest pain and he does not have edema.    Remote history of colon adenoma 2007 details unavailable Path report reviewed.  Echocardiogram 12/29/2021 Left ventricular ejection fraction, by estimation, is 60 to 65%. The left ventricle has normal function. The left ventricle has no regional wall motion abnormalities. Left ventricular diastolic parameters are indeterminate. 1. 2. Right ventricular systolic function is normal. The right ventricular size is normal. 3. Left atrial size was moderately dilated. The mitral valve is normal in structure. Mild mitral valve regurgitation. No evidence of mitral stenosis. 4. The aortic valve is calcified. Aortic valve regurgitation is mild. Moderate aortic valve stenosis. 5. Comparison(s): No significant change from prior study.   No Known Allergies Current Meds  Medication Sig   atorvastatin (LIPITOR) 40 MG tablet Take 1 tablet (40 mg total) by mouth daily.   carvedilol (COREG) 6.25 MG tablet Take 1 tablet (6.25 mg total) by mouth 2 (two) times daily with a meal.   finasteride (PROSCAR) 5 MG tablet Take 1 tablet (5 mg total) by mouth daily.   silodosin (RAPAFLO) 8 MG CAPS capsule Take 1 capsule (8 mg total) by mouth daily with breakfast.   warfarin (COUMADIN) 5 MG tablet TAKE 1/2 TABLET TO 1 TABLET DAILY BY MOUTH OR AS DIRECTED BY COUMADIN CLINIC   Past Medical History:  Diagnosis Date   Allergic rhinitis 08/08/2013   takes Loratadine daily    Aortic stenosis 09/08/2011   With mild aortic regurgitation, mean gradient 13 mmHg    Cataract    right but immature   Complication of anesthesia    stopped breathing - during shoulder surgery 2009-2010   Coronary artery disease 10/02/2009   Cardiac cath (October 2007): 20% left main, 60% mid LAD, 60-70% distal LAD, 30-40% first diagonal, 30% circumflex, 40% OM, 30-40% RCA    Degenerative joint disease of shoulder 08/03/2013   Bilateral, s/p right total shoulder  arthroplasty 05/29/2011 and left shoulder arthroplasty 63/06/6008   Diastolic dysfunction 93/08/3555   Echo (04/04/2016): Grade I   Diverticulosis 10/07/2013   Seen on colonoscopy in 2007    Erectile dysfunction 05/07/2009   Essential hypertension 08/03/2013   had been on Lisinopril but stopped per MD   First degree AV block 03/14/2016   Gastric ulcer 10/07/2013   Seen on EGD 04/10/2006    Hemorrhoids 10/07/2013   Hyperlipidemia 10/02/2009   Paroxysmal atrial fibrillation ([place]) 07/14/2006   One episode, provoked by alcohol use disorder, briefly anticoagulated with warfarin, then switched to aspirin alone   Sciatica associated with disorder of lumbar spine 08/03/2013   Anterolisthesis with right L 4-5 nerve root compression.  Treated with epidural injections and Physical Therapy.    Seborrheic keratosis 10/07/2013   Tubular adenoma of colon    Past Surgical History:  Procedure Laterality Date   CARDIAC CATHETERIZATION  2007   CARDIOVERSION     CARDIOVERSION N/A 08/04/2019   Procedure: CARDIOVERSION;  Surgeon: Fay Records, MD;  Location: [facility] ENDOSCOPY;  Service: Cardiovascular;  Laterality: N/A;   COLONOSCOPY     CYSTOSCOPY N/A 01/04/2022   Procedure: Consuela Mimes;  Surgeon: Raynelle Bring, MD;  Location: [place];  Service: Urology;  Laterality: N/A;   INSERTION OF MESH N/A 03/11/2019   Procedure: Insertion Of Mesh;  Surgeon: Ralene Ok, MD;  Location: [place];  Service: General;  Laterality: N/A;   IR EXCHANGE BILIARY DRAIN  02/14/2022   IR PERC CHOLECYSTOSTOMY  12/29/2021   JOINT REPLACEMENT     KNEE ARTHROSCOPY WITH MENISCAL REPAIR Right 01/30/2011   LAPAROTOMY N/A 01/04/2022   Procedure: EXPLORATORY LAPAROTOMY WITH EXTRAPERITONEAL BLADDER REPAIR;  Surgeon: Raynelle Bring, MD;  Location: [place];  Service: Urology;  Laterality: N/A;   right finger surgery     as a child   TONSILLECTOMY     TOTAL KNEE ARTHROPLASTY Left 07/28/2017   Procedure: LEFT TOTAL KNEE ARTHROPLASTY;  Surgeon: Renette Butters,  MD;  Location: [place];  Service: Orthopedics;  Laterality: Left;   TOTAL SHOULDER ARTHROPLASTY  05/29/2011   Procedure: TOTAL SHOULDER ARTHROPLASTY;  Surgeon: Metta Clines Supple;  Location: [place];  Service: Orthopedics;  Laterality: Right;   TOTAL SHOULDER ARTHROPLASTY Left 06/08/2014   DR SUPPLE   TOTAL SHOULDER ARTHROPLASTY Left 06/08/2014   Procedure: LEFT TOTAL SHOULDER ARTHROPLASTY;  Surgeon: Marin Shutter, MD;  Location: [place];  Service: Orthopedics;  Laterality: Left;   UMBILICAL HERNIA REPAIR N/A 03/11/2019   Procedure: LAPAROSCOPIC UMBILICAL HERNIA;  Surgeon: Ralene Ok, MD;  Location: [place];  Service: General;  Laterality: N/A;   [place] wife and dog.  Former Teacher, early years/pre.   family history includes Bradycardia (age of onset: 62) in his mother; Coronary artery disease (age of onset: 58) in his brother; Coronary artery disease (age of onset: 69) in his father; Healthy in his son; Obesity in his daughter.   Review of Systems See HPI all other review of systems appear negative at this time.  Objective:   Physical Exam '@BP'$  116/64   Pulse (!) 57   Ht '5\' 6"'$  (1.676 m)   Wt 168 lb 3.2 oz (76.3 kg)   SpO2 98%   BMI 27.15 kg/m @  General:  Elderly white man in no acute distress Eyes:  anicteric. Lungs: Clear to auscultation bilaterally. Heart:   S1S2, there is a musical 2/6 to 3/6 systolic murmur heard throughout the precordium best at the left and right upper sternal borders.  I do not hear it in the neck.  No gallops. Abdomen: Protuberant, soft, non-tender, no hepatosplenomegaly, hernia, or mass and BS+.  There is a cholecystostomy tube in place in the right upper quadrant bandaged and capped.  There is a bandage in the low midline from bladder repair surgery.  Extremities:   no edema, cyanosis or clubbing Neuro:  A&O x 3.  Psych:  appropriate mood and  Affect.   Data  Reviewed: See HPI

## 2022-03-11 ENCOUNTER — Ambulatory Visit: Payer: Medicare HMO | Admitting: Internal Medicine

## 2022-03-11 ENCOUNTER — Encounter: Payer: Self-pay | Admitting: Internal Medicine

## 2022-03-11 VITALS — BP 116/64 | HR 57 | Ht 66.0 in | Wt 168.2 lb

## 2022-03-11 DIAGNOSIS — I480 Paroxysmal atrial fibrillation: Secondary | ICD-10-CM | POA: Diagnosis not present

## 2022-03-11 DIAGNOSIS — K8070 Calculus of gallbladder and bile duct without cholecystitis without obstruction: Secondary | ICD-10-CM | POA: Diagnosis not present

## 2022-03-11 DIAGNOSIS — Z7901 Long term (current) use of anticoagulants: Secondary | ICD-10-CM

## 2022-03-11 DIAGNOSIS — I350 Nonrheumatic aortic (valve) stenosis: Secondary | ICD-10-CM | POA: Diagnosis not present

## 2022-03-11 NOTE — Patient Instructions (Signed)
Hopefully we will get your ERCP done the week  of October 23rd. We will be in touch about plans.  Good luck with your cardiology visit 03/13/2022.   I appreciate the opportunity to care for you. Silvano Rusk, MD, [facility]

## 2022-03-13 ENCOUNTER — Encounter: Payer: Self-pay | Admitting: Cardiology

## 2022-03-13 ENCOUNTER — Ambulatory Visit: Payer: Medicare HMO | Attending: Cardiology | Admitting: Cardiology

## 2022-03-13 VITALS — BP 134/58 | HR 81 | Ht 66.0 in | Wt 168.0 lb

## 2022-03-13 DIAGNOSIS — E785 Hyperlipidemia, unspecified: Secondary | ICD-10-CM

## 2022-03-13 DIAGNOSIS — Z01810 Encounter for preprocedural cardiovascular examination: Secondary | ICD-10-CM | POA: Diagnosis not present

## 2022-03-13 DIAGNOSIS — I350 Nonrheumatic aortic (valve) stenosis: Secondary | ICD-10-CM | POA: Diagnosis not present

## 2022-03-13 DIAGNOSIS — I2510 Atherosclerotic heart disease of native coronary artery without angina pectoris: Secondary | ICD-10-CM | POA: Diagnosis not present

## 2022-03-13 DIAGNOSIS — I4821 Permanent atrial fibrillation: Secondary | ICD-10-CM

## 2022-03-13 DIAGNOSIS — I10 Essential (primary) hypertension: Secondary | ICD-10-CM

## 2022-03-13 NOTE — Patient Instructions (Signed)
  Follow-Up: At [facility], you and your health needs are our priority.  As part of our continuing mission to provide you with exceptional heart care, we have created designated Provider Care Teams.  These Care Teams include your primary Cardiologist (physician) and Advanced Practice Providers (APPs -  Physician Assistants and Nurse Practitioners) who all work together to provide you with the care you need, when you need it.  We recommend signing up for the patient portal called "MyChart".  Sign up information is provided on this After Visit Summary.  MyChart is used to connect with patients for Virtual Visits (Telemedicine).  Patients are able to view lab/test results, encounter notes, upcoming appointments, etc.  Non-urgent messages can be sent to your provider as well.   To learn more about what you can do with MyChart, go to [email or web address].    Your next appointment:   6 month(s)  The format for your next appointment:   In Person  Provider:   Brian Crenshaw, MD    

## 2022-03-17 ENCOUNTER — Telehealth: Payer: Self-pay | Admitting: Internal Medicine

## 2022-03-17 NOTE — Telephone Encounter (Signed)
[name] - this man needs to be set up for ERCP on Oct 23 at [facility] hospital to follow already scheduled procedure.  I am the [facility] hospital doctor that week and should be able to schedule that - he has common bile duct stones. You may have to call the endoscopy unit before you call scheduling.  Needs to hold warfarin 5 days before  He has received cardiology clearance 03/13/22 fpr cholecystectomy so ERCP ok also.   They did not specifically mention warfarin holding so we can check with the pharmacist w/ [facility]  I am ccing his surgeon Dr. Bobbye Morton who wants to do a lap chole after I do the ERCP.  If we need to do it on a different day that week for her purposes that should be ok we just have to coordinate with endoscopy, etc

## 2022-03-17 NOTE — Telephone Encounter (Signed)
I would like Unasyn 1.5 g IV on call

## 2022-03-18 ENCOUNTER — Telehealth: Payer: Self-pay | Admitting: *Deleted

## 2022-03-18 NOTE — Telephone Encounter (Signed)
Sounds good. Thank you

## 2022-03-18 NOTE — Telephone Encounter (Signed)
Call from [place].  Patient is experiencing Allergy  type symptoms after mowing grass on Saturday.  HR 88 blood pressure 138/98.  Irregular pulse.  No dizziness.  Oxygen Sats 94-97 %.  Vitals within normal limits for patient. Lungs sound good,  No problems.  Plan to discharge patient from PT today. Daughter who is a Marine scientist has started patient on Claritin.  Nurses will continue their visits with patient.  Linwood Dibbles can be reached at [phone].

## 2022-03-19 ENCOUNTER — Other Ambulatory Visit: Payer: Self-pay

## 2022-03-19 ENCOUNTER — Telehealth: Payer: Self-pay

## 2022-03-19 DIAGNOSIS — K8070 Calculus of gallbladder and bile duct without cholecystitis without obstruction: Secondary | ICD-10-CM

## 2022-03-19 MED ORDER — AMPICILLIN-SULBACTAM SODIUM 1.5 (1-0.5) G IJ SOLR: 1.5000 g | Freq: Once | INTRAMUSCULAR | Status: DC

## 2022-03-19 NOTE — Telephone Encounter (Signed)
[name] from [facility] called requesting verbal orders and to request a albuterol inhaler for patient having SOB and wheezing please return clara phone call it is secure to lvm [phone]

## 2022-03-19 NOTE — Telephone Encounter (Signed)
Pt was scheduled for an ERCP at [facility] on 04/21/2022 at 10:00 AM with Dr. Carlean Purl: Case ID number [id number]:  Pt made aware Prep instructions were sent to pt via my chart: Pt made aware:  Ambulatory referral to GI placed in Epic: Message sent to pharmacist w/ [place] to hold warfarin 5 days before Unasyn 1.5 g IV on call ordered Pt verbalized understanding with all questions answered.

## 2022-03-19 NOTE — Telephone Encounter (Signed)
[name] Pre-operative Risk Assessment     [name] July 29, 1945 [id number]  Procedure: ERCP Anesthesia type:  MAC Procedure Date: 04/21/2022 Provider: Dr. Silvano Rusk  Type of Clearance needed: Pharmacy  Medication(s) needing held: Warfarin   Length of time for medication to be held: 5 days before  Please review request and advise by either responding to this message or by sending your response to the fax # provided below.  Thank you,  [facility]  Phone: [phone] Fax: [phone] Attention: Gillermina Hu RN

## 2022-03-20 ENCOUNTER — Ambulatory Visit: Payer: Medicare HMO | Attending: Cardiology | Admitting: *Deleted

## 2022-03-20 DIAGNOSIS — I4819 Other persistent atrial fibrillation: Secondary | ICD-10-CM

## 2022-03-20 DIAGNOSIS — Z5181 Encounter for therapeutic drug level monitoring: Secondary | ICD-10-CM | POA: Diagnosis not present

## 2022-03-20 LAB — POCT INR: INR: 2.6 (ref 2.0–3.0)

## 2022-03-20 NOTE — Telephone Encounter (Signed)
Received call from [place], Linton Hall from [facility]. Requesting VO for [place] 1 week 4 for surgical wound care and assessment of biliary drain. Verbal auth given. Will route to PCP for agreement/denial.  Also, since patient has no hx of asthma or COPD, advised patient schedules an appt to be evaluated for breathing difficulties. Per [employer], he was offered an appt but declined. [name] did say he tried his daughter's albuterol inhaler and it did not work.

## 2022-03-20 NOTE — Telephone Encounter (Signed)
Thank you. I agree with the VO. Not sure what to make of the breathing concern. He is still working through a lot of deconditioning. Would reinforce to make an appointment with me in clinic if breathing concern worsens.

## 2022-03-20 NOTE — Patient Instructions (Signed)
Description   Continue taking warfarin 1/2 tablet (2.'5mg'$ ) daily. Remain consistent with leafy veggies. Recheck INR in 4 weeks-prior to surgery date. Coumadin Clinic [phone].

## 2022-03-20 NOTE — Telephone Encounter (Signed)
RTC to [place].  Message left on Voice mail  that Clinics had called to see what orders she needed.   All to patient to see why he needs the Inhaler.  Patient stated is not wheezing today.  Comes and goes.   Was a suggestion from PT.   Was wheezing when she suggested the Albuterol Inhaler.  Stated was told to get Claritin has taken 3 for 3 days.  Is ok today.  No shortness of breath, wheezing, fevers or cough.

## 2022-03-24 NOTE — Telephone Encounter (Signed)
   Patient Name: [name]  DOB: [birth year] MRN: [id number]  Primary Cardiologist: Kirk Ruths, MD  Chart reviewed as part of pre-operative protocol coverage. Given past medical history and time since last visit, based on ACC/AHA guidelines, [name] would be at acceptable risk for the planned procedure without further cardiovascular testing.   The patient was cleared for surgery during a recent office visit with Dr. Stanford Breed on 03/13/2022.   Per office protocol, patient can hold warfarin for 5 days prior to procedure. Patient will not need bridging with Lovenox (enoxaparin) around procedure.   I will route this recommendation, as well as note from recent OV to the requesting party via [place] fax function and remove from pre-op pool.  Please call with questions.  Lenna Sciara, NP 03/24/2022, 1:56 PM

## 2022-03-24 NOTE — Telephone Encounter (Signed)
Patient with diagnosis of A Fib on warfarin for anticoagulation.    Procedure: 04/21/22 Date of procedure: ERCP   CHA2DS2-VASc Score = 3  This indicates a 3.2% annual risk of stroke. The patient's score is based upon: CHF History: 0 HTN History: 1 Diabetes History: 0 Stroke History: 0 Vascular Disease History: 1 Age Score: 1 Gender Score: 0     CrCl 68 mL/min Platelet count 199K  Per office protocol, patient can hold warfarin for 5 days prior to procedure.    Patient will not need bridging with Lovenox (enoxaparin) around procedure.  **This guidance is not considered finalized until pre-operative APP has relayed final recommendations.**

## 2022-03-26 ENCOUNTER — Telehealth: Payer: Self-pay | Admitting: Internal Medicine

## 2022-03-26 NOTE — Telephone Encounter (Signed)
Pt made aware that Primary Cardiologist: Kirk Ruths, MD  office  stated Per office protocol, patient can hold warfarin for 5 days prior to procedure: Pt verbalized understanding with all questions answered.

## 2022-03-26 NOTE — Telephone Encounter (Signed)
Inbound call from patient returning call. Please advise.  Thank you  

## 2022-03-26 NOTE — Telephone Encounter (Signed)
Pt stated that he Called [facility] and spoke to the nurse there and they notified him to just leave the drain out for now and if he does start to develop abdominal pain then go to the ER for evaluation:

## 2022-03-26 NOTE — Telephone Encounter (Signed)
Spoke with [place]  from [facility] who stated that she went to change the dressing on pt Biliary  drain and noticed that it wa sout and lookded like it had been out for several day: Pt last dressing change was last week:  Pt contacted and was notified for him to call [facility] and speak with his Surgeon in regard with what they have pt to do: Pt was given number to [facility]: [phone]: Pt verbalized understanding with all questions answered.

## 2022-03-26 NOTE — Telephone Encounter (Signed)
Inbound call from patient and [place]  from [facility].  She stated that she went to change the dressing on patients cholecystostomy tube and noticed that it was out. [place] is requesting a call back to discuss. Please advise.   [phone]

## 2022-04-09 ENCOUNTER — Other Ambulatory Visit: Payer: Self-pay

## 2022-04-09 ENCOUNTER — Telehealth: Payer: Self-pay

## 2022-04-09 DIAGNOSIS — K8070 Calculus of gallbladder and bile duct without cholecystitis without obstruction: Secondary | ICD-10-CM

## 2022-04-09 NOTE — Telephone Encounter (Signed)
Order was placed for ERCP on Oct 23rd to be done in t [place] by Dr. Lewanda Rife with scheduling was notified of order:   Pt was scheduled for an ERCP at [facility] starting at 0730 AM Case # [id number]: Lap Chole by Dr. Bobbye Morton to follow ERCP in OR: Pt notified to arrive at [facility] at 6:00 AM  Pt made aware that ERCP is scheduled at [facility] starting at 0730 AM to be done by Dr. Carlean Purl followed by Dr Bobbye Morton who is going to remove his gallbladder in the OR. Pt stated that he has a pre op visit at [facility] 04/15/2022 Pt verbalized understanding with all questions answered.

## 2022-04-11 ENCOUNTER — Other Ambulatory Visit (HOSPITAL_COMMUNITY): Payer: Medicare HMO

## 2022-04-14 NOTE — Pre-Procedure Instructions (Signed)
Surgical Instructions    Your procedure is scheduled on Monday, October 23.  Report to [facility] Main Entrance "A" at 5:30 A.M., then check in with the Admitting office.  Call this number if you have problems the morning of surgery:  [phone]   If you have any questions prior to your surgery date call [phone]: Open Monday-Friday 8am-4pm If you experience any cold or flu symptoms such as cough, fever, chills, shortness of breath, etc. between now and your scheduled surgery, please notify us at the above number     Remember:  Do not eat after midnight the night before your surgery  You may drink clear liquids until 4:30AM the morning of your surgery.   Clear liquids allowed are: Water, Non-Citrus Juices (without pulp), Carbonated Beverages, Clear Tea, Black Coffee ONLY (NO MILK, CREAM OR POWDERED CREAMER of any kind), and Gatorade  Do not drink anything that is colored red or purple.     Take these medicines the morning of surgery with A SIP OF WATER:  carvedilol (COREG)  atorvastatin (LIPITOR)  finasteride (PROSCAR) silodosin (RAPAFLO) loratadine (CLARITIN) 10  if needed acetaminophen (TYLENOL) if needed  Follow your surgeon's instructions on when to stop Coumadin (Warfarin) and Lovenox.  If no instructions were given by your surgeon then you will need to call the office to get those instructions.    As of today, STOP taking any Aspirin (unless otherwise instructed by your surgeon) Aleve, Naproxen, Ibuprofen, Motrin, Advil, Goody's, BC's, all herbal medications, fish oil, and all vitamins.             [place] is not responsible for any belongings or valuables.    Do NOT Smoke (Tobacco/Vaping)  24 hours prior to your procedure  If you use a CPAP at night, you may bring your mask for your overnight stay.   Contacts, glasses, hearing aids, dentures or partials may not be worn into surgery, please bring cases for these belongings   For patients admitted to the  hospital, discharge time will be determined by your treatment team.   Patients discharged the day of surgery will not be allowed to drive home, and someone needs to stay with them for 24 hours.   SURGICAL WAITING ROOM VISITATION Patients having surgery or a procedure may have no more than 2 support people in the waiting area - these visitors may rotate.   Children under the age of 29 must have an adult with them who is not the patient. If the patient needs to stay at the hospital during part of their recovery, the visitor guidelines for inpatient rooms apply. Pre-op nurse will coordinate an appropriate time for 1 support person to accompany patient in pre-op.  This support person may not rotate.   Please refer to the [facility] website for the visitor guidelines for Inpatients (after your surgery is over and you are in a regular room).    Special instructions:    Oral Hygiene is also important to reduce your risk of infection.  Remember - BRUSH YOUR TEETH THE MORNING OF SURGERY WITH YOUR REGULAR TOOTHPASTE   [place]- Preparing For Surgery  Before surgery, you can play an important role. Because skin is not sterile, your skin needs to be as free of germs as possible. You can reduce the number of germs on your skin by washing with CHG (chlorahexidine gluconate) Soap before surgery.  CHG is an antiseptic cleaner which kills germs and bonds with the skin to continue killing germs  even after washing.     Please do not use if you have an allergy to CHG or antibacterial soaps. If your skin becomes reddened/irritated stop using the CHG.  Do not shave (including legs and underarms) for at least 48 hours prior to first CHG shower. It is OK to shave your face.  Please follow these instructions carefully.     Shower the NIGHT BEFORE SURGERY and the MORNING OF SURGERY with CHG Soap.   If you chose to wash your hair, wash your hair first as usual with your normal shampoo. After you shampoo,  rinse your hair and body thoroughly to remove the shampoo.  Then [employer] and genitals (private parts) with your normal soap and rinse thoroughly to remove soap.  After that Use CHG Soap as you would any other liquid soap. You can apply CHG directly to the skin and wash gently with a scrungie or a clean washcloth.   Apply the CHG Soap to your body ONLY FROM THE NECK DOWN.  Do not use on open wounds or open sores. Avoid contact with your eyes, ears, mouth and genitals (private parts). Wash Face and genitals (private parts)  with your normal soap.   Wash thoroughly, paying special attention to the area where your surgery will be performed.  Thoroughly rinse your body with warm water from the neck down.  DO NOT shower/wash with your normal soap after using and rinsing off the CHG Soap.  Pat yourself dry with a CLEAN TOWEL.  Wear CLEAN PAJAMAS to bed the night before surgery  Place CLEAN SHEETS on your bed the night before your surgery  DO NOT SLEEP WITH PETS.   Day of Surgery:  Take a shower with CHG soap. Wear Clean/Comfortable clothing the morning of surgery Do not wear jewelry or makeup. Do not wear lotions, powders, perfumes/cologne or deodorant. Do not shave 48 hours prior to surgery.  Men may shave face and neck. Do not bring valuables to the hospital. Do not wear nail polish, gel polish, artificial nails, or any other type of covering on natural nails (fingers and toes) If you have artificial nails or gel coating that need to be removed by a nail salon, please have this removed prior to surgery. Artificial nails or gel coating may interfere with anesthesia's ability to adequately monitor your vital signs.  Remember to brush your teeth WITH YOUR REGULAR TOOTHPASTE.    If you received a COVID test during your pre-op visit, it is requested that you wear a mask when out in public, stay away from anyone that may not be feeling well, and notify your surgeon if you develop symptoms.  If you have been in contact with anyone that has tested positive in the last 10 days, please notify your surgeon.    Please read over the following fact sheets that you were given.

## 2022-04-15 ENCOUNTER — Encounter (HOSPITAL_COMMUNITY): Payer: Self-pay

## 2022-04-15 ENCOUNTER — Ambulatory Visit: Payer: Medicare HMO | Admitting: Cardiology

## 2022-04-15 ENCOUNTER — Ambulatory Visit (INDEPENDENT_AMBULATORY_CARE_PROVIDER_SITE_OTHER): Payer: Medicare HMO

## 2022-04-15 ENCOUNTER — Other Ambulatory Visit: Payer: Self-pay

## 2022-04-15 ENCOUNTER — Encounter
Admission: RE | Admit: 2022-04-15 | Discharge: 2022-04-15 | Disposition: A | Discharge status: Home / Self Care | Payer: Medicare HMO | Source: Ambulatory Visit | Attending: Surgery | Admitting: Surgery

## 2022-04-15 VITALS — BP 149/87 | HR 55 | Temp 97.9°F | Resp 18 | Ht 66.0 in | Wt 165.7 lb

## 2022-04-15 DIAGNOSIS — I4819 Other persistent atrial fibrillation: Secondary | ICD-10-CM | POA: Insufficient documentation

## 2022-04-15 DIAGNOSIS — Z01818 Encounter for other preprocedural examination: Secondary | ICD-10-CM | POA: Insufficient documentation

## 2022-04-15 DIAGNOSIS — Z5181 Encounter for therapeutic drug level monitoring: Secondary | ICD-10-CM | POA: Diagnosis not present

## 2022-04-15 DIAGNOSIS — I10 Essential (primary) hypertension: Secondary | ICD-10-CM | POA: Diagnosis not present

## 2022-04-15 LAB — BASIC METABOLIC PANEL
Anion gap: 9 (ref 5–15)
BUN: 10 mg/dL (ref 8–23)
CO2: 26 mmol/L (ref 22–32)
Calcium: 9.4 mg/dL (ref 8.9–10.3)
Chloride: 106 mmol/L (ref 98–111)
Creatinine, Ser: 0.9 mg/dL (ref 0.61–1.24)
GFR, Estimated: >60 mL/min (ref >60)
Glucose, Bld: 92 mg/dL (ref 70–99)
Potassium: 3.4 mmol/L — ABNORMAL LOW (ref 3.5–5.1)
Sodium: 141 mmol/L (ref 135–145)

## 2022-04-15 LAB — CBC
HCT: 37.3 % — ABNORMAL LOW (ref 39.0–52.0)
Hemoglobin: 12.2 g/dL — ABNORMAL LOW (ref 13.0–17.0)
MCH: 30.8 pg (ref 26.0–34.0)
MCHC: 32.7 g/dL (ref 30.0–36.0)
MCV: 94.2 fL (ref 80.0–100.0)
Platelets: 175 10*3/uL (ref 150–400)
RBC: 3.96 MIL/uL — ABNORMAL LOW (ref 4.22–5.81)
RDW: 13.9 % (ref 11.5–15.5)
WBC: 8.8 10*3/uL (ref 4.0–10.5)
nRBC: 0 % (ref 0.0–0.2)

## 2022-04-15 LAB — POCT INR: INR: 2.2 (ref 2.0–3.0)

## 2022-04-15 NOTE — Patient Instructions (Signed)
Continue taking warfarin 1/2 tablet (2.'5mg'$ ) daily. Remain consistent with leafy veggies. Recheck INR in 2 weeks. Coumadin Clinic [phone]. [place] Bl[facility] surgery 10/23;  HOLD 10/18-10/22

## 2022-04-15 NOTE — Pre-Procedure Instructions (Signed)
Surgical Instructions    Your procedure is scheduled on Monday October 23.  Report to [facility] Main Entrance "A" at 5:30 A.M., then check in with the Admitting office.  Call this number if you have problems the morning of surgery:  [phone]  If you have any questions prior to your surgery date call [phone]: Open Monday-Friday 8am-4pm  If you experience any cold or flu symptoms such as cough, fever, chills, shortness of breath, etc. between now and your scheduled surgery, please notify us at the above number     Remember:  Do not eat or drink after midnight the night before your surgery    Take these medicines the morning of surgery with A SIP OF WATER:  atorvastatin (LIPITOR)  carvedilol (COREG) finasteride (PROSCAR) silodosin (RAPAFLO)  IF NEEDED: acetaminophen (TYLENOL) loratadine (CLARITIN)  Follow your surgeon's instructions on when to stop warfarin (COUMADIN) and Lovenox.  If no instructions were given by your surgeon then you will need to call the office to get those instructions.    As of today, STOP taking any Aspirin (unless otherwise instructed by your surgeon) Aleve, Naproxen, Ibuprofen, Motrin, Advil, Goody's, BC's, all herbal medications, fish oil, and all vitamins.          Do NOT Smoke (Tobacco/Vaping)  24 hours prior to your procedure  If you use a CPAP at night, you may bring your mask for your overnight stay.   Contacts, glasses, hearing aids, dentures or partials may not be worn into surgery, please bring cases for these belongings   For patients admitted to the hospital, discharge time will be determined by your treatment team.   Patients discharged the day of surgery will not be allowed to drive home, and someone needs to stay with them for 24 hours.   SURGICAL WAITING ROOM VISITATION Patients having surgery or a procedure may have no more than 2 support people in the waiting area - these visitors may rotate.   Children under the age of 89  must have an adult with them who is not the patient. If the patient needs to stay at the hospital during part of their recovery, the visitor guidelines for inpatient rooms apply. Pre-op nurse will coordinate an appropriate time for 1 support person to accompany patient in pre-op.  This support person may not rotate.   Please refer to the [facility] website for the visitor guidelines for Inpatients (after your surgery is over and you are in a regular room).    Special instructions:    Oral Hygiene is also important to reduce your risk of infection.  Remember - BRUSH YOUR TEETH THE MORNING OF SURGERY WITH YOUR REGULAR TOOTHPASTE   [place]- Preparing For Surgery  Before surgery, you can play an important role. Because skin is not sterile, your skin needs to be as free of germs as possible. You can reduce the number of germs on your skin by washing with CHG (chlorahexidine gluconate) Soap before surgery.  CHG is an antiseptic cleaner which kills germs and bonds with the skin to continue killing germs even after washing.     Please do not use if you have an allergy to CHG or antibacterial soaps. If your skin becomes reddened/irritated stop using the CHG.  Do not shave (including legs and underarms) for at least 48 hours prior to first CHG shower. It is OK to shave your face.  Please follow these instructions carefully.     Shower the [employer] SURGERY and the  MORNING OF SURGERY with CHG Soap.   If you chose to wash your hair, wash your hair first as usual with your normal shampoo. After you shampoo, rinse your hair and body thoroughly to remove the shampoo.  Then [employer] and genitals (private parts) with your normal soap and rinse thoroughly to remove soap.  After that Use CHG Soap as you would any other liquid soap. You can apply CHG directly to the skin and wash gently with a scrungie or a clean washcloth.   Apply the CHG Soap to your body ONLY FROM THE NECK DOWN.  Do not use on  open wounds or open sores. Avoid contact with your eyes, ears, mouth and genitals (private parts). Wash Face and genitals (private parts)  with your normal soap.   Wash thoroughly, paying special attention to the area where your surgery will be performed.  Thoroughly rinse your body with warm water from the neck down.  DO NOT shower/wash with your normal soap after using and rinsing off the CHG Soap.  Pat yourself dry with a CLEAN TOWEL.  Wear CLEAN PAJAMAS to bed the night before surgery  Place CLEAN SHEETS on your bed the night before your surgery  DO NOT SLEEP WITH PETS.   Day of Surgery:  Take a shower with CHG soap. Wear Clean/Comfortable clothing the morning of surgery Remember to brush your teeth WITH YOUR REGULAR TOOTHPASTE. Do not wear jewelry. Do not wear lotions, powders, cologne or deodorant. Men may shave face and neck. Do not bring valuables to the hospital. [facility] is not responsible for any belongings or valuables.   If you received a COVID test during your pre-op visit, it is requested that you wear a mask when out in public, stay away from anyone that may not be feeling well, and notify your surgeon if you develop symptoms. If you have been in contact with anyone that has tested positive in the last 10 days, please notify your surgeon.    Please read over the following fact sheets that you were given.

## 2022-04-15 NOTE — Progress Notes (Signed)
PCP - Axel Filler Cardiologist - Claris Pong  PPM/ICD - denies  Chest x-ray - 01/08/22 EKG - 01/07/22 Stress Test - denies ECHO - 12/29/21 Cardiac Cath - 2007  Sleep Study - denies CPAP - n/a  No diabetes.  Follow your surgeon's instructions on when to stop Coumadin (Warfarin) and Lovenox.  If no instructions were given by your surgeon then you will need to call the office to get those instructions.     As of today, STOP taking any Aspirin (unless otherwise instructed by your surgeon) Aleve, Naproxen, Ibuprofen, Motrin, Advil, Goody's, BC's, all herbal medications, fish oil, and all vitamins.  ERAS Protcol - no PRE-SURGERY Ensure or G2- n/a  COVID TEST- no   Anesthesia review: yes- recent hospitalization and cardiac history  Patient denies shortness of breath, fever, cough and chest pain at PAT appointment   All instructions explained to the patient, with a verbal understanding of the material. Patient agrees to go over the instructions while at home for a better understanding. Patient also instructed to self quarantine after being tested for COVID-19. The opportunity to ask questions was provided.

## 2022-04-16 NOTE — Anesthesia Preprocedure Evaluation (Addendum)
Anesthesia Evaluation  Patient identified by MRN, date of birth, ID band Patient awake  General Assessment Comment:Current URI with cough covid test pending  Reviewed: Allergy & Precautions, NPO status , Patient's Chart, lab work & pertinent test results  Airway Mallampati: III  TM Distance: <3 FB Neck ROM: Full    Dental no notable dental hx.    Pulmonary neg pulmonary ROS, former smoker,    Pulmonary exam normal breath sounds clear to auscultation       Cardiovascular hypertension, Normal cardiovascular exam+ dysrhythmias Atrial Fibrillation + Valvular Problems/Murmurs AS and AI  Rhythm:Regular Rate:Normal     Neuro/Psych negative neurological ROS  negative psych ROS   GI/Hepatic Neg liver ROS,   Endo/Other  negative endocrine ROS  Renal/GU negative Renal ROS  negative genitourinary   Musculoskeletal negative musculoskeletal ROS (+)   Abdominal   Peds negative pediatric ROS (+)  Hematology negative hematology ROS (+)   Anesthesia Other Findings   Reproductive/Obstetrics negative OB ROS                            Anesthesia Physical Anesthesia Plan  ASA: 3  Anesthesia Plan: General   Post-op Pain Management: Minimal or no pain anticipated   Induction: Intravenous  PONV Risk Score and Plan: 2 and Ondansetron, Dexamethasone and Treatment may vary due to age or medical condition  Airway Management Planned: Oral ETT  Additional Equipment:   Intra-op Plan:   Post-operative Plan: Extubation in OR  Informed Consent: I have reviewed the patients History and Physical, chart, labs and discussed the procedure including the risks, benefits and alternatives for the proposed anesthesia with the patient or authorized representative who has indicated his/her understanding and acceptance.     Dental advisory given  Plan Discussed with: CRNA and Surgeon  Anesthesia Plan Comments: (PAT  note by Karoline Caldwell, PA-C: Follows with cardiology for history of NICMimproved by most recent echocardiogramand AS. He also has a history of coronary artery disease with cardiomyopathy out of proportion,alcohol use, now resolved and paroxysmal atrial fibrillation. Pt underwent successful cardioversion February 2021.Last echocardiogram July 2023 showed normal LV function, moderate left atrial enlargement, mild mitral regurgitation, mild aortic insufficiency and moderate aortic stenosis (mean gradient 17.3 mmHg with valve area 1.1 cm). Last seen by Dr. Stanford Breed 03/13/2022.  Per note, "preoperative evaluation prior to cholecystectomy-patient denies exertional chest pain. Prior to his recent hospitalization he had excellent functional capacity with no chest pain. His aortic stenosis is moderate on most recent echocardiogram. He may proceed with surgery without further cardiac evaluation."  Recently prolonged admission 7/1 through 01/20/2022 for acute cholecystitis managed with percutaneous drain, acute hypoxic respiratory failure and perforated bladder. He did require intubation for progressive hypoxemia, extubated 01/09/22. Was doing well when seen in followup by PCP Dr. Lalla Brothers on 02/24/22. Per note, " Patient continues to have a percutaneous cholecystostomy tube in place, no longer hooked up to continuous suction bulb.  He is feeling well, no ileocolic symptoms with eating.  He saw Dr. Bobbye Morton with [facility] surgery and they are tentatively planning for cholecystectomy with drain removal at that time in the near future.  His functional status is improving very nicely at home.  I think his functional status is adequate at this point to undergo surgery.  He has valvular atrial fibrillation anticoagulated on warfarin for primary prevention of stroke, can discontinue warfarin 4 days prior to surgery, no need for bridging therapy given low  risk for ischemic event."  Per Coumadin clinic note  04/15/2022, patient holding Coumadin 10/18-10/22.  Preop labs reviewed, mild anemia hemoglobin 12.2, mild hypokalemia potassium 3.4, otherwise unremarkable.  TTE 12/29/21: 1. Left ventricular ejection fraction, by estimation, is 60 to 65%. The  left ventricle has normal function. The left ventricle has no regional  wall motion abnormalities. Left ventricular diastolic parameters are  indeterminate.  2. Right ventricular systolic function is normal. The right ventricular  size is normal.  3. Left atrial size was moderately dilated.  4. The mitral valve is normal in structure. Mild mitral valve  regurgitation. No evidence of mitral stenosis.  5. The aortic valve is calcified. Aortic valve regurgitation is mild.  Moderate aortic valve stenosis.   )       Anesthesia Quick Evaluation

## 2022-04-16 NOTE — Progress Notes (Signed)
Anesthesia Chart Review:  Follows with cardiology for history of NICM improved by most recent echocardiogram and AS. He also has a history of coronary artery disease with cardiomyopathy out of proportion, alcohol use, now resolved and paroxysmal atrial fibrillation. Pt underwent successful cardioversion February 2021. Last echocardiogram July 2023 showed normal LV function, moderate left atrial enlargement, mild mitral regurgitation, mild aortic insufficiency and moderate aortic stenosis (mean gradient 17.3 mmHg with valve area 1.1 cm).  Last seen by Dr. Stanford Breed 03/13/2022.  Per note, "preoperative evaluation prior to cholecystectomy-patient denies exertional chest pain.  Prior to his recent hospitalization he had excellent functional capacity with no chest pain.  His aortic stenosis is moderate on most recent echocardiogram.  He may proceed with surgery without further cardiac evaluation."  Recently prolonged admission 7/1 through 01/20/2022 for acute cholecystitis managed with percutaneous drain, acute hypoxic respiratory failure and perforated bladder. He did require intubation for progressive hypoxemia, extubated 01/09/22. Was doing well when seen in followup by PCP Dr. Lalla Brothers on 02/24/22. Per note, " Patient continues to have a percutaneous cholecystostomy tube in place, no longer hooked up to continuous suction bulb.  He is feeling well, no ileocolic symptoms with eating.  He saw Dr. Bobbye Morton with [facility] surgery and they are tentatively planning for cholecystectomy with drain removal at that time in the near future.  His functional status is improving very nicely at home.  I think his functional status is adequate at this point to undergo surgery.  He has valvular atrial fibrillation anticoagulated on warfarin for primary prevention of stroke, can discontinue warfarin 4 days prior to surgery, no need for bridging therapy given low risk for ischemic event."  Per Coumadin clinic note  04/15/2022, patient holding Coumadin 10/18-10/22.  Preop labs reviewed, mild anemia hemoglobin 12.2, mild hypokalemia potassium 3.4, otherwise unremarkable.  TTE 12/29/21:  1. Left ventricular ejection fraction, by estimation, is 60 to 65%. The  left ventricle has normal function. The left ventricle has no regional  wall motion abnormalities. Left ventricular diastolic parameters are  indeterminate.   2. Right ventricular systolic function is normal. The right ventricular  size is normal.   3. Left atrial size was moderately dilated.   4. The mitral valve is normal in structure. Mild mitral valve  regurgitation. No evidence of mitral stenosis.   5. The aortic valve is calcified. Aortic valve regurgitation is mild.  Moderate aortic valve stenosis.     Wynonia Musty [facility] Short Stay Center/Anesthesiology Phone [phone] 04/16/2022 2:08 PM

## 2022-04-21 ENCOUNTER — Other Ambulatory Visit: Payer: Self-pay

## 2022-04-21 ENCOUNTER — Ambulatory Visit (HOSPITAL_COMMUNITY): Payer: Medicare HMO | Admitting: Certified Registered"

## 2022-04-21 ENCOUNTER — Encounter (HOSPITAL_COMMUNITY): Payer: Self-pay | Admitting: Surgery

## 2022-04-21 ENCOUNTER — Encounter (HOSPITAL_COMMUNITY)
Admission: AD | Disposition: A | Discharge status: Home Health | Payer: Self-pay | Source: Home / Self Care | Attending: Surgery

## 2022-04-21 ENCOUNTER — Ambulatory Visit (HOSPITAL_COMMUNITY): Payer: Medicare HMO

## 2022-04-21 ENCOUNTER — Inpatient Hospital Stay (HOSPITAL_COMMUNITY): Payer: Medicare HMO

## 2022-04-21 ENCOUNTER — Inpatient Hospital Stay (HOSPITAL_COMMUNITY)
Admission: AD | Admit: 2022-04-21 | Discharge: 2022-04-24 | DRG: 417 | Disposition: A | Discharge status: Home Health | Payer: Medicare HMO | Attending: Surgery | Admitting: Surgery

## 2022-04-21 ENCOUNTER — Ambulatory Visit (HOSPITAL_COMMUNITY): Payer: Medicare HMO | Admitting: Physician Assistant

## 2022-04-21 DIAGNOSIS — Z87891 Personal history of nicotine dependence: Secondary | ICD-10-CM | POA: Diagnosis not present

## 2022-04-21 DIAGNOSIS — Z419 Encounter for procedure for purposes other than remedying health state, unspecified: Secondary | ICD-10-CM

## 2022-04-21 DIAGNOSIS — K8050 Calculus of bile duct without cholangitis or cholecystitis without obstruction: Secondary | ICD-10-CM | POA: Diagnosis not present

## 2022-04-21 DIAGNOSIS — Z96652 Presence of left artificial knee joint: Secondary | ICD-10-CM | POA: Diagnosis present

## 2022-04-21 DIAGNOSIS — Z8249 Family history of ischemic heart disease and other diseases of the circulatory system: Secondary | ICD-10-CM

## 2022-04-21 DIAGNOSIS — Z4659 Encounter for fitting and adjustment of other gastrointestinal appliance and device: Secondary | ICD-10-CM | POA: Diagnosis not present

## 2022-04-21 DIAGNOSIS — K317 Polyp of stomach and duodenum: Secondary | ICD-10-CM | POA: Diagnosis present

## 2022-04-21 DIAGNOSIS — I350 Nonrheumatic aortic (valve) stenosis: Secondary | ICD-10-CM | POA: Diagnosis not present

## 2022-04-21 DIAGNOSIS — E785 Hyperlipidemia, unspecified: Secondary | ICD-10-CM | POA: Diagnosis present

## 2022-04-21 DIAGNOSIS — K8064 Calculus of gallbladder and bile duct with chronic cholecystitis without obstruction: Principal | ICD-10-CM | POA: Diagnosis present

## 2022-04-21 DIAGNOSIS — I10 Essential (primary) hypertension: Secondary | ICD-10-CM

## 2022-04-21 DIAGNOSIS — E43 Unspecified severe protein-calorie malnutrition: Secondary | ICD-10-CM | POA: Diagnosis not present

## 2022-04-21 DIAGNOSIS — I480 Paroxysmal atrial fibrillation: Secondary | ICD-10-CM | POA: Diagnosis not present

## 2022-04-21 DIAGNOSIS — N529 Male erectile dysfunction, unspecified: Secondary | ICD-10-CM | POA: Diagnosis present

## 2022-04-21 DIAGNOSIS — J309 Allergic rhinitis, unspecified: Secondary | ICD-10-CM | POA: Diagnosis present

## 2022-04-21 DIAGNOSIS — Z96612 Presence of left artificial shoulder joint: Secondary | ICD-10-CM | POA: Diagnosis present

## 2022-04-21 DIAGNOSIS — Z79899 Other long term (current) drug therapy: Secondary | ICD-10-CM | POA: Diagnosis not present

## 2022-04-21 DIAGNOSIS — Z7901 Long term (current) use of anticoagulants: Secondary | ICD-10-CM

## 2022-04-21 DIAGNOSIS — J90 Pleural effusion, not elsewhere classified: Secondary | ICD-10-CM | POA: Diagnosis not present

## 2022-04-21 DIAGNOSIS — I2510 Atherosclerotic heart disease of native coronary artery without angina pectoris: Secondary | ICD-10-CM | POA: Diagnosis present

## 2022-04-21 DIAGNOSIS — Z20822 Contact with and (suspected) exposure to covid-19: Secondary | ICD-10-CM | POA: Diagnosis present

## 2022-04-21 DIAGNOSIS — Z96611 Presence of right artificial shoulder joint: Secondary | ICD-10-CM | POA: Diagnosis present

## 2022-04-21 DIAGNOSIS — J9621 Acute and chronic respiratory failure with hypoxia: Secondary | ICD-10-CM | POA: Diagnosis present

## 2022-04-21 DIAGNOSIS — I509 Heart failure, unspecified: Secondary | ICD-10-CM | POA: Diagnosis not present

## 2022-04-21 DIAGNOSIS — R059 Cough, unspecified: Secondary | ICD-10-CM | POA: Diagnosis not present

## 2022-04-21 DIAGNOSIS — Z6829 Body mass index (BMI) 29.0-29.9, adult: Secondary | ICD-10-CM

## 2022-04-21 DIAGNOSIS — K8070 Calculus of gallbladder and bile duct without cholecystitis without obstruction: Secondary | ICD-10-CM

## 2022-04-21 DIAGNOSIS — R0602 Shortness of breath: Secondary | ICD-10-CM | POA: Diagnosis not present

## 2022-04-21 DIAGNOSIS — K8010 Calculus of gallbladder with chronic cholecystitis without obstruction: Secondary | ICD-10-CM | POA: Diagnosis not present

## 2022-04-21 DIAGNOSIS — Z9049 Acquired absence of other specified parts of digestive tract: Principal | ICD-10-CM

## 2022-04-21 HISTORY — PX: ERCP: SHX5425

## 2022-04-21 HISTORY — PX: INTRAOPERATIVE CHOLANGIOGRAM: SHX5230

## 2022-04-21 HISTORY — PX: CHOLECYSTECTOMY: SHX55

## 2022-04-21 LAB — GLUCOSE, CAPILLARY
Glucose-Capillary: 119 mg/dL — ABNORMAL HIGH (ref 70–99)
Glucose-Capillary: 89 mg/dL (ref 70–99)

## 2022-04-21 LAB — COMPREHENSIVE METABOLIC PANEL
ALT: 35 U/L (ref 0–44)
AST: 106 U/L — ABNORMAL HIGH (ref 15–41)
Albumin: 3.1 g/dL — ABNORMAL LOW (ref 3.5–5.0)
Alkaline Phosphatase: 63 U/L (ref 38–126)
Anion gap: 8 (ref 5–15)
BUN: 12 mg/dL (ref 8–23)
CO2: 23 mmol/L (ref 22–32)
Calcium: 8.4 mg/dL — ABNORMAL LOW (ref 8.9–10.3)
Chloride: 109 mmol/L (ref 98–111)
Creatinine, Ser: 0.94 mg/dL (ref 0.61–1.24)
GFR, Estimated: >60 mL/min (ref >60)
Glucose, Bld: 109 mg/dL — ABNORMAL HIGH (ref 70–99)
Potassium: 3.6 mmol/L (ref 3.5–5.1)
Sodium: 140 mmol/L (ref 135–145)
Total Bilirubin: 0.9 mg/dL (ref 0.3–1.2)
Total Protein: 6 g/dL — ABNORMAL LOW (ref 6.5–8.1)

## 2022-04-21 LAB — POCT I-STAT 7, (LYTES, BLD GAS, ICA,H+H)
Acid-base deficit: 4 mmol/L — ABNORMAL HIGH (ref 0.0–2.0)
Bicarbonate: 21.4 mmol/L (ref 20.0–28.0)
Calcium, Ion: 1.23 mmol/L (ref 1.15–1.40)
HCT: 31 % — ABNORMAL LOW (ref 39.0–52.0)
Hemoglobin: 10.5 g/dL — ABNORMAL LOW (ref 13.0–17.0)
O2 Saturation: 98 %
Potassium: 3.8 mmol/L (ref 3.5–5.1)
Sodium: 143 mmol/L (ref 135–145)
TCO2: 23 mmol/L (ref 22–32)
pCO2 arterial: 39.5 mmHg (ref 32–48)
pH, Arterial: 7.342 — ABNORMAL LOW (ref 7.35–7.45)
pO2, Arterial: 114 mmHg — ABNORMAL HIGH (ref 83–108)

## 2022-04-21 LAB — TYPE AND SCREEN
ABO/RH(D): B NEG
Antibody Screen: NEGATIVE

## 2022-04-21 LAB — MRSA NEXT GEN BY PCR, NASAL: MRSA by PCR Next Gen: NOT DETECTED

## 2022-04-21 LAB — SARS CORONAVIRUS 2 BY RT PCR: SARS Coronavirus 2 by RT PCR: NEGATIVE

## 2022-04-21 SURGERY — ERCP, WITH INTERVENTION IF INDICATED
Anesthesia: General

## 2022-04-21 SURGERY — LAPAROSCOPIC CHOLECYSTECTOMY
Anesthesia: General | Site: Mouth

## 2022-04-21 MED ORDER — MIDAZOLAM HCL 2 MG/2ML IJ SOLN
INTRAMUSCULAR | Status: AC
  Filled 2022-04-21: qty 2

## 2022-04-21 MED ORDER — SODIUM CHLORIDE 0.9 % IV SOLN: INTRAVENOUS | Status: DC

## 2022-04-21 MED ORDER — LIDOCAINE 2% (20 MG/ML) 5 ML SYRINGE
INTRAMUSCULAR | Status: DC | PRN
  Administered 2022-04-21: 100 mg via INTRAVENOUS

## 2022-04-21 MED ORDER — IOHEXOL 300 MG/ML  SOLN
INTRAMUSCULAR | Status: DC | PRN
  Administered 2022-04-21: 34 mL

## 2022-04-21 MED ORDER — ROCURONIUM BROMIDE 10 MG/ML (PF) SYRINGE
PREFILLED_SYRINGE | INTRAVENOUS | Status: AC
  Filled 2022-04-21: qty 10

## 2022-04-21 MED ORDER — PROPOFOL 500 MG/50ML IV EMUL
INTRAVENOUS | Status: DC | PRN
  Administered 2022-04-21: 35 ug/kg/min via INTRAVENOUS

## 2022-04-21 MED ORDER — VASOPRESSIN 20 UNIT/ML IV SOLN
INTRAVENOUS | Status: AC
  Filled 2022-04-21: qty 1

## 2022-04-21 MED ORDER — VASOPRESSIN 20 UNIT/ML IV SOLN
INTRAVENOUS | Status: DC | PRN
  Administered 2022-04-21 (×6): 2 [IU] via INTRAVENOUS

## 2022-04-21 MED ORDER — ORAL CARE MOUTH RINSE: 15.0000 mL | OROMUCOSAL | Status: DC | PRN

## 2022-04-21 MED ORDER — ACETAMINOPHEN 10 MG/ML IV SOLN
1000.0000 mg | Freq: Four times a day (QID) | INTRAVENOUS | Status: AC
  Administered 2022-04-21 – 2022-04-22 (×3): 1000 mg via INTRAVENOUS
  Filled 2022-04-21 (×4): qty 100

## 2022-04-21 MED ORDER — PHENYLEPHRINE HCL-NACL 20-0.9 MG/250ML-% IV SOLN
0.0000 ug/min | INTRAVENOUS | Status: AC
  Administered 2022-04-21: 60 ug/min via INTRAVENOUS
  Filled 2022-04-21: qty 250

## 2022-04-21 MED ORDER — VITAL HIGH PROTEIN PO LIQD
1000.0000 mL | ORAL | Status: DC
  Administered 2022-04-21 – 2022-04-23 (×2): 1000 mL

## 2022-04-21 MED ORDER — ACETAMINOPHEN 500 MG PO TABS: 1000.0000 mg | ORAL_TABLET | ORAL | Status: DC

## 2022-04-21 MED ORDER — 0.9 % SODIUM CHLORIDE (POUR BTL) OPTIME
TOPICAL | Status: DC | PRN
  Administered 2022-04-21: 1000 mL

## 2022-04-21 MED ORDER — PROSOURCE TF20 ENFIT COMPATIBL EN LIQD
60.0000 mL | Freq: Every day | ENTERAL | Status: DC
  Administered 2022-04-22 – 2022-04-24 (×3): 60 mL
  Filled 2022-04-21 (×3): qty 60

## 2022-04-21 MED ORDER — LACTATED RINGERS IV SOLN: INTRAVENOUS | Status: DC

## 2022-04-21 MED ORDER — ONDANSETRON HCL 4 MG/2ML IJ SOLN
INTRAMUSCULAR | Status: DC | PRN
  Administered 2022-04-21: 4 mg via INTRAVENOUS

## 2022-04-21 MED ORDER — GLUCAGON HCL RDNA (DIAGNOSTIC) 1 MG IJ SOLR
INTRAMUSCULAR | Status: AC
  Filled 2022-04-21: qty 1

## 2022-04-21 MED ORDER — CHLORHEXIDINE GLUCONATE CLOTH 2 % EX PADS: 6.0000 | MEDICATED_PAD | Freq: Once | CUTANEOUS | Status: DC

## 2022-04-21 MED ORDER — METHOCARBAMOL 1000 MG/10ML IJ SOLN
750.0000 mg | Freq: Four times a day (QID) | INTRAVENOUS | Status: DC
  Administered 2022-04-21 – 2022-04-22 (×2): 750 mg via INTRAVENOUS
  Filled 2022-04-21 (×3): qty 7.5

## 2022-04-21 MED ORDER — SODIUM CHLORIDE 0.9 % IV SOLN: INTRAVENOUS | Status: DC | PRN

## 2022-04-21 MED ORDER — CEFAZOLIN SODIUM-DEXTROSE 2-4 GM/100ML-% IV SOLN: 2.0000 g | INTRAVENOUS | Status: DC

## 2022-04-21 MED ORDER — NOREPINEPHRINE 4 MG/250ML-% IV SOLN: 2.0000 ug/min | INTRAVENOUS | Status: DC

## 2022-04-21 MED ORDER — FENTANYL 2500MCG IN NS 250ML (10MCG/ML) PREMIX INFUSION
25.0000 ug/h | INTRAVENOUS | Status: DC
  Administered 2022-04-21: 25 ug/h via INTRAVENOUS
  Filled 2022-04-21: qty 250

## 2022-04-21 MED ORDER — POLYETHYLENE GLYCOL 3350 17 G PO PACK: 17.0000 g | PACK | Freq: Every day | ORAL | Status: DC

## 2022-04-21 MED ORDER — MORPHINE SULFATE (PF) 2 MG/ML IV SOLN: 2.0000 mg | INTRAVENOUS | Status: DC | PRN

## 2022-04-21 MED ORDER — ONDANSETRON 4 MG PO TBDP: 4.0000 mg | ORAL_TABLET | Freq: Four times a day (QID) | ORAL | Status: DC | PRN

## 2022-04-21 MED ORDER — SODIUM CHLORIDE 0.9 % IV SOLN: 250.0000 mL | INTRAVENOUS | Status: DC

## 2022-04-21 MED ORDER — ORAL CARE MOUTH RINSE: 15.0000 mL | Freq: Once | OROMUCOSAL | Status: AC

## 2022-04-21 MED ORDER — SUCCINYLCHOLINE CHLORIDE 200 MG/10ML IV SOSY
PREFILLED_SYRINGE | INTRAVENOUS | Status: AC
  Filled 2022-04-21: qty 10

## 2022-04-21 MED ORDER — BUPIVACAINE-EPINEPHRINE (PF) 0.25% -1:200000 IJ SOLN
INTRAMUSCULAR | Status: AC
  Filled 2022-04-21: qty 30

## 2022-04-21 MED ORDER — PROPOFOL 1000 MG/100ML IV EMUL
0.0000 ug/kg/min | INTRAVENOUS | Status: DC
  Administered 2022-04-21: 40 ug/kg/min via INTRAVENOUS
  Administered 2022-04-21: 25 ug/kg/min via INTRAVENOUS
  Administered 2022-04-22: 20 ug/kg/min via INTRAVENOUS
  Filled 2022-04-21 (×2): qty 100

## 2022-04-21 MED ORDER — PROPOFOL 10 MG/ML IV BOLUS
INTRAVENOUS | Status: DC | PRN
  Administered 2022-04-21: 100 mg via INTRAVENOUS

## 2022-04-21 MED ORDER — FENTANYL CITRATE (PF) 250 MCG/5ML IJ SOLN
INTRAMUSCULAR | Status: DC | PRN
  Administered 2022-04-21 (×3): 50 ug via INTRAVENOUS

## 2022-04-21 MED ORDER — FENTANYL CITRATE (PF) 100 MCG/2ML IJ SOLN: 25.0000 ug | INTRAMUSCULAR | Status: DC | PRN

## 2022-04-21 MED ORDER — PHENYLEPHRINE HCL-NACL 20-0.9 MG/250ML-% IV SOLN
INTRAVENOUS | Status: DC | PRN
  Administered 2022-04-21: 50 ug/min via INTRAVENOUS

## 2022-04-21 MED ORDER — FENTANYL CITRATE (PF) 250 MCG/5ML IJ SOLN
INTRAMUSCULAR | Status: AC
  Filled 2022-04-21: qty 5

## 2022-04-21 MED ORDER — BUPIVACAINE-EPINEPHRINE (PF) 0.25% -1:200000 IJ SOLN
INTRAMUSCULAR | Status: DC | PRN
  Administered 2022-04-21: 30 mL via PERINEURAL

## 2022-04-21 MED ORDER — ONDANSETRON HCL 4 MG/2ML IJ SOLN: 4.0000 mg | Freq: Four times a day (QID) | INTRAMUSCULAR | Status: DC | PRN

## 2022-04-21 MED ORDER — CHLORHEXIDINE GLUCONATE CLOTH 2 % EX PADS
6.0000 | MEDICATED_PAD | Freq: Every day | CUTANEOUS | Status: DC
  Administered 2022-04-21 – 2022-04-23 (×4): 6 via TOPICAL

## 2022-04-21 MED ORDER — CHLORHEXIDINE GLUCONATE 0.12 % MT SOLN
15.0000 mL | Freq: Once | OROMUCOSAL | Status: AC
  Administered 2022-04-21: 15 mL via OROMUCOSAL
  Filled 2022-04-21: qty 15

## 2022-04-21 MED ORDER — ROCURONIUM BROMIDE 10 MG/ML (PF) SYRINGE
PREFILLED_SYRINGE | INTRAVENOUS | Status: DC | PRN
  Administered 2022-04-21 (×2): 50 mg via INTRAVENOUS

## 2022-04-21 MED ORDER — DEXAMETHASONE SODIUM PHOSPHATE 10 MG/ML IJ SOLN
INTRAMUSCULAR | Status: DC | PRN
  Administered 2022-04-21: 10 mg via INTRAVENOUS

## 2022-04-21 MED ORDER — SUGAMMADEX SODIUM 200 MG/2ML IV SOLN
INTRAVENOUS | Status: DC | PRN
  Administered 2022-04-21: 200 mg via INTRAVENOUS

## 2022-04-21 MED ORDER — ADULT MULTIVITAMIN LIQUID CH
15.0000 mL | Freq: Every day | ORAL | Status: DC
  Administered 2022-04-22 – 2022-04-23 (×2): 15 mL
  Filled 2022-04-21 (×2): qty 15

## 2022-04-21 MED ORDER — SUCCINYLCHOLINE CHLORIDE 200 MG/10ML IV SOSY
PREFILLED_SYRINGE | INTRAVENOUS | Status: DC | PRN
  Administered 2022-04-21: 100 mg via INTRAVENOUS

## 2022-04-21 MED ORDER — ACETAMINOPHEN 10 MG/ML IV SOLN: 1000.0000 mg | Freq: Once | INTRAVENOUS | Status: DC | PRN

## 2022-04-21 MED ORDER — FENTANYL BOLUS VIA INFUSION
25.0000 ug | INTRAVENOUS | Status: DC | PRN
  Administered 2022-04-21 – 2022-04-22 (×4): 50 ug via INTRAVENOUS

## 2022-04-21 MED ORDER — ACETAMINOPHEN 500 MG PO TABS: 1000.0000 mg | ORAL_TABLET | Freq: Four times a day (QID) | ORAL | Status: DC

## 2022-04-21 MED ORDER — CEFAZOLIN SODIUM-DEXTROSE 2-4 GM/100ML-% IV SOLN
INTRAVENOUS | Status: AC
  Filled 2022-04-21: qty 100

## 2022-04-21 MED ORDER — DOCUSATE SODIUM 100 MG PO CAPS
100.0000 mg | ORAL_CAPSULE | Freq: Two times a day (BID) | ORAL | Status: DC
  Administered 2022-04-22 – 2022-04-24 (×4): 100 mg via ORAL
  Filled 2022-04-21 (×5): qty 1

## 2022-04-21 MED ORDER — SODIUM CHLORIDE 0.9 % IR SOLN
Status: DC | PRN
  Administered 2022-04-21: 1

## 2022-04-21 MED ORDER — ALBUMIN HUMAN 5 % IV SOLN: INTRAVENOUS | Status: DC | PRN

## 2022-04-21 MED ORDER — METHOCARBAMOL 750 MG PO TABS
750.0000 mg | ORAL_TABLET | Freq: Four times a day (QID) | ORAL | Status: DC
  Administered 2022-04-21 – 2022-04-24 (×9): 750 mg via ORAL
  Filled 2022-04-21 (×16): qty 1

## 2022-04-21 MED ORDER — ALBUTEROL SULFATE HFA 108 (90 BASE) MCG/ACT IN AERS
INHALATION_SPRAY | RESPIRATORY_TRACT | Status: DC | PRN
  Administered 2022-04-21 (×2): 6 via RESPIRATORY_TRACT

## 2022-04-21 MED ORDER — GLUCAGON HCL RDNA (DIAGNOSTIC) 1 MG IJ SOLR
INTRAMUSCULAR | Status: DC | PRN
  Administered 2022-04-21 (×2): .25 mg via INTRAVENOUS

## 2022-04-21 MED ORDER — ONDANSETRON HCL 4 MG/2ML IJ SOLN
INTRAMUSCULAR | Status: AC
  Filled 2022-04-21: qty 2

## 2022-04-21 MED ORDER — OXYCODONE HCL 5 MG PO TABS: 5.0000 mg | ORAL_TABLET | Freq: Once | ORAL | Status: DC | PRN

## 2022-04-21 MED ORDER — FENTANYL CITRATE PF 50 MCG/ML IJ SOSY: 25.0000 ug | PREFILLED_SYRINGE | Freq: Once | INTRAMUSCULAR | Status: DC

## 2022-04-21 MED ORDER — OXYCODONE HCL 5 MG/5ML PO SOLN: 5.0000 mg | ORAL | Status: DC | PRN

## 2022-04-21 MED ORDER — CEFAZOLIN SODIUM-DEXTROSE 2-3 GM-%(50ML) IV SOLR
INTRAVENOUS | Status: DC | PRN
  Administered 2022-04-21: 2 g via INTRAVENOUS

## 2022-04-21 MED ORDER — PROPOFOL 1000 MG/100ML IV EMUL: 0.0000 ug/kg/min | INTRAVENOUS | Status: DC

## 2022-04-21 MED ORDER — LIDOCAINE 2% (20 MG/ML) 5 ML SYRINGE
INTRAMUSCULAR | Status: AC
  Filled 2022-04-21: qty 5

## 2022-04-21 MED ORDER — DICLOFENAC SUPPOSITORY 100 MG
RECTAL | Status: AC
  Filled 2022-04-21: qty 1

## 2022-04-21 MED ORDER — ORAL CARE MOUTH RINSE
15.0000 mL | OROMUCOSAL | Status: DC
  Administered 2022-04-21 – 2022-04-22 (×6): 15 mL via OROMUCOSAL

## 2022-04-21 MED ORDER — STERILE WATER FOR IRRIGATION IR SOLN
Status: DC | PRN
  Administered 2022-04-21: 1000 mL

## 2022-04-21 MED ORDER — ENOXAPARIN SODIUM 40 MG/0.4ML IJ SOSY
40.0000 mg | PREFILLED_SYRINGE | INTRAMUSCULAR | Status: DC
  Administered 2022-04-22 – 2022-04-23 (×2): 40 mg via SUBCUTANEOUS
  Filled 2022-04-21 (×2): qty 0.4

## 2022-04-21 MED ORDER — PROPOFOL 10 MG/ML IV BOLUS
INTRAVENOUS | Status: AC
  Filled 2022-04-21: qty 20

## 2022-04-21 MED ORDER — ONDANSETRON HCL 4 MG/2ML IJ SOLN: 4.0000 mg | Freq: Once | INTRAMUSCULAR | Status: DC | PRN

## 2022-04-21 MED ORDER — LACTATED RINGERS IV SOLN: INTRAVENOUS | Status: DC | PRN

## 2022-04-21 MED ORDER — INDOMETHACIN 50 MG RE SUPP
RECTAL | Status: AC
  Filled 2022-04-21: qty 2

## 2022-04-21 MED ORDER — OXYCODONE HCL 5 MG/5ML PO SOLN: 5.0000 mg | Freq: Once | ORAL | Status: DC | PRN

## 2022-04-21 SURGICAL SUPPLY — 52 items
ADH SKN CLS APL DERMABOND .7 (GAUZE/BANDAGES/DRESSINGS) ×2
APL PRP STRL LF DISP 70% ISPRP (MISCELLANEOUS) ×2
APPLIER CLIP 5 13 M/L LIGAMAX5 (MISCELLANEOUS) ×2
APR CLP MED LRG 5 ANG JAW (MISCELLANEOUS) ×2
BAG SPEC RTRVL 10 TROC 200 (ENDOMECHANICALS) ×2
BLADE CLIPPER SURG (BLADE) IMPLANT
CANISTER SUCT 3000ML PPV (MISCELLANEOUS) ×2 IMPLANT
CATH COUDE FOLEY 2W 5CC 18FR (CATHETERS) IMPLANT
CHLORAPREP W/TINT 26 (MISCELLANEOUS) ×2 IMPLANT
CLIP APPLIE 5 13 M/L LIGAMAX5 (MISCELLANEOUS) ×2 IMPLANT
COVER MAYO STAND STRL (DRAPES) IMPLANT
COVER SURGICAL LIGHT HANDLE (MISCELLANEOUS) ×2 IMPLANT
DERMABOND ADVANCED .7 DNX12 (GAUZE/BANDAGES/DRESSINGS) ×2 IMPLANT
DRAPE C-ARM 42X120 X-RAY (DRAPES) IMPLANT
ELECT CAUTERY BLADE 6.4 (BLADE) ×2 IMPLANT
ELECT REM PT RETURN 9FT ADLT (ELECTROSURGICAL) ×2
ELECTRODE REM PT RTRN 9FT ADLT (ELECTROSURGICAL) ×2 IMPLANT
GLOVE BIO SURGEON STRL SZ 6.5 (GLOVE) ×2 IMPLANT
GLOVE BIOGEL PI IND STRL 6 (GLOVE) ×2 IMPLANT
GOWN STRL REUS W/ TWL LRG LVL3 (GOWN DISPOSABLE) ×6 IMPLANT
GOWN STRL REUS W/TWL LRG LVL3 (GOWN DISPOSABLE) ×6
GRASPER SUT TROCAR 14GX15 (MISCELLANEOUS) IMPLANT
IRRIG SUCT STRYKERFLOW 2 WTIP (MISCELLANEOUS) ×2
IRRIGATION SUCT STRKRFLW 2 WTP (MISCELLANEOUS) IMPLANT
KIT BASIN OR (CUSTOM PROCEDURE TRAY) ×2 IMPLANT
KIT TURNOVER KIT B (KITS) ×2 IMPLANT
NDL INSUFFLATION 14GA 120MM (NEEDLE) IMPLANT
NEEDLE INSUFFLATION 14GA 120MM (NEEDLE) ×2 IMPLANT
NS IRRIG 1000ML POUR BTL (IV SOLUTION) ×2 IMPLANT
PAD ARMBOARD 7.5X6 YLW CONV (MISCELLANEOUS) ×2 IMPLANT
PENCIL BUTTON HOLSTER BLD 10FT (ELECTRODE) ×2 IMPLANT
POUCH RETRIEVAL ECOSAC 10 (ENDOMECHANICALS) ×2 IMPLANT
POUCH RETRIEVAL ECOSAC 10MM (ENDOMECHANICALS) ×2
SCISSORS LAP 5X35 DISP (ENDOMECHANICALS) ×2 IMPLANT
SET CHOLANGIOGRAPH 5 50 .035 (SET/KITS/TRAYS/PACK) IMPLANT
SET IRRIG TUBING LAPAROSCOPIC (IRRIGATION / IRRIGATOR) IMPLANT
SET TUBE SMOKE EVAC HIGH FLOW (TUBING) ×2 IMPLANT
SLEEVE ENDOPATH XCEL 5M (ENDOMECHANICALS) ×4 IMPLANT
SLEEVE Z-THREAD 5X100MM (TROCAR) IMPLANT
SUT MNCRL AB 4-0 PS2 18 (SUTURE) ×2 IMPLANT
SUT VIC AB 0 UR5 27 (SUTURE) IMPLANT
SUT VICRYL 0 AB UR-6 (SUTURE) IMPLANT
SYR 3ML LL SCALE MARK (SYRINGE) IMPLANT
TOWEL GREEN STERILE FF (TOWEL DISPOSABLE) ×2 IMPLANT
TRAY CATH INTERMITTENT SS 16FR (CATHETERS) IMPLANT
TRAY FOLEY MTR SLVR 16FR STAT (SET/KITS/TRAYS/PACK) IMPLANT
TRAY LAPAROSCOPIC MC (CUSTOM PROCEDURE TRAY) ×2 IMPLANT
TROCAR XCEL BLUNT TIP 100MML (ENDOMECHANICALS) ×2 IMPLANT
TROCAR Z THREAD OPTICAL 12X100 (TROCAR) IMPLANT
TROCAR Z-THREAD OPTICAL 5X100M (TROCAR) ×2 IMPLANT
WARMER LAPAROSCOPE (MISCELLANEOUS) ×2 IMPLANT
WATER STERILE IRR 1000ML POUR (IV SOLUTION) ×2 IMPLANT

## 2022-04-21 NOTE — OR Nursing (Signed)
Attempted an in and out cath on patient at the end of case was a 16 foley wouldn't pass through tip of penis. CRNA decide to keep patient intubated. So decide to do indwelling 14 fr catheter wouldn't pass through tip of penis either. Try a 12 fr as well would not go through. Called Dr. Bobbye Morton at 1101am for a coude foley she attempted 47 fr with out success. Charge nurse Ewell Poe called Urology for consult in the PACU at 1113am.

## 2022-04-21 NOTE — Progress Notes (Signed)
Previously stated pt with 7.5ETT but pt currently has 7.0 ETT.

## 2022-04-21 NOTE — Progress Notes (Signed)
RT attempted times 2 to place radial aline in RT radial and was unable to advance catheter.

## 2022-04-21 NOTE — Procedures (Signed)
Cortrak  Tube Type:  Cortrak - 43 inches Tube Location:  Left nare Initial Placement:  Stomach Secured by: Bridle Technique Used to Measure Tube Placement:  Marking at nare/corner of mouth Cortrak Secured At:  71 cm   Cortrak Tube Team Note:  Consult received to place a Cortrak feeding tube.   X-ray is required, abdominal x-ray has been ordered by the Cortrak team. Please confirm tube placement before using the Cortrak tube.   If the tube becomes dislodged please keep the tube and contact the Cortrak team at [email or web address] for replacement.  If after hours and replacement cannot be delayed, place a NG tube and confirm placement with an abdominal x-ray.    Koleen Distance MS, RD, LDN Please refer to [facility] for RD and/or RD on-call/weekend/after hours pager

## 2022-04-21 NOTE — Progress Notes (Signed)
Pt received in PACU from Anesthesia. 7.5 ETT at 21cm in place. Pt placed on full support vent settings. Pink tape removed and Hollister tube holder placed on pt and ETT resecured at 21cm.

## 2022-04-21 NOTE — Transfer of Care (Signed)
Immediate Anesthesia Transfer of Care Note  Patient: RUDRANSH BELLANCA  Procedure(s) Performed: LAPAROSCOPIC CHOLECYSTECTOMY (Abdomen) ENDOSCOPIC RETROGRADE CHOLANGIOPANCREATOGRAPHY (ERCP) (Mouth)  Patient Location: PACU  Anesthesia Type:General  Level of Consciousness: Patient remains intubated per anesthesia plan  Airway & Oxygen Therapy: Patient remains intubated per anesthesia plan and Patient placed on Ventilator (see vital sign flow sheet for setting)  Post-op Assessment: Post -op Vital signs reviewed and stable  Post vital signs: stable  Last Vitals:  Vitals Value Taken Time  BP 121/67 04/21/22 1130  Temp    Pulse 50 04/21/22 1133  Resp 15 04/21/22 1133  SpO2 98 % 04/21/22 1133  Vitals shown include unvalidated device data.  Last Pain:  Vitals:   04/21/22 0639  TempSrc:   PainSc: 0-No pain         Complications: No notable events documented.

## 2022-04-21 NOTE — Anesthesia Postprocedure Evaluation (Signed)
Anesthesia Post Note  Patient: [name]  Procedure(s) Performed: LAPAROSCOPIC CHOLECYSTECTOMY (Abdomen) ENDOSCOPIC RETROGRADE CHOLANGIOPANCREATOGRAPHY (ERCP) (Mouth)     Patient location during evaluation: SICU Anesthesia Type: General Level of consciousness: sedated Pain management: pain level controlled Vital Signs Assessment: post-procedure vital signs reviewed and stable Respiratory status: patient remains intubated per anesthesia plan Cardiovascular status: stable Postop Assessment: no apparent nausea or vomiting Anesthetic complications: no Comments: Patient had saturations intra-op of 90-91%, unable to use high levels of PEEP because of mild hypotension. Did not meet criteria for extubation in the OR. He had hypoxic respiratory failure in July requiring intubation. Will go to the unit intubated and try to extubate later this afternoon   No notable events documented.  Last Vitals:  Vitals:   04/21/22 1130 04/21/22 1145  BP: 121/67 112/63  Pulse: (!) 59 (!) 55  Resp: 14 14  Temp: (!) 36.2 C   SpO2: 100% 100%    Last Pain:  Vitals:   04/21/22 0639  TempSrc:   PainSc: 0-No pain                 Dyasia Firestine S

## 2022-04-21 NOTE — H&P (Signed)
[name] is an 76 y.o. male.   HPI: 40M with h/o non-obstructing choledocholithiasis. The patient has had no hospitalizations, doctors visits, ER visits, surgeries, or newly diagnosed allergies since being seen in the office.  Plan for concomitant ERCP and lap chole.   Past Medical History:  Diagnosis Date   Allergic rhinitis 08/08/2013   takes Loratadine daily    Aortic stenosis 09/08/2011   With mild aortic regurgitation, mean gradient 13 mmHg    Cataract    right but immature   CHF (congestive heart failure) ([place]) 3976   Complication of anesthesia    stopped breathing - during shoulder surgery 2009-2010   Coronary artery disease 10/02/2009   Cardiac cath (October 2007): 20% left main, 60% mid LAD, 60-70% distal LAD, 30-40% first diagonal, 30% circumflex, 40% OM, 30-40% RCA    Degenerative joint disease of shoulder 08/03/2013   Bilateral, s/p right total shoulder arthroplasty 05/29/2011 and left shoulder arthroplasty 73/41/9379   Diastolic dysfunction 02/40/9735   Echo (04/04/2016): Grade I   Diverticulosis 10/07/2013   Seen on colonoscopy in 2007    Erectile dysfunction 05/07/2009   Essential hypertension 08/03/2013   had been on Lisinopril but stopped per MD   First degree AV block 03/14/2016   Gastric ulcer 10/07/2013   Seen on EGD 04/10/2006    Hemorrhoids 10/07/2013   Hyperlipidemia 10/02/2009   Paroxysmal atrial fibrillation ([place]) 07/14/2006   One episode, provoked by alcohol use disorder, briefly anticoagulated with warfarin, then switched to aspirin alone   Sciatica associated with disorder of lumbar spine 08/03/2013   Anterolisthesis with right L 4-5 nerve root compression.  Treated with epidural injections and Physical Therapy.    Seborrheic keratosis 10/07/2013   Tubular adenoma of colon     Past Surgical History:  Procedure Laterality Date   CARDIAC CATHETERIZATION  2007   CARDIOVERSION     CARDIOVERSION N/A 08/04/2019   Procedure: CARDIOVERSION;   Surgeon: Fay Records, MD;  Location: [facility];  Service: Cardiovascular;  Laterality: N/A;   COLONOSCOPY     CYSTOSCOPY N/A 01/04/2022   Procedure: Consuela Mimes;  Surgeon: Raynelle Bring, MD;  Location: [facility];  Service: Urology;  Laterality: N/A;   EYE SURGERY Bilateral 2022   bilateral cataracts   INSERTION OF MESH N/A 03/11/2019   Procedure: Insertion Of Mesh;  Surgeon: Ralene Ok, MD;  Location: [facility];  Service: General;  Laterality: N/A;   IR EXCHANGE BILIARY DRAIN  02/14/2022   IR PERC CHOLECYSTOSTOMY  12/29/2021   JOINT REPLACEMENT     KNEE ARTHROSCOPY WITH MENISCAL REPAIR Right 01/30/2011   LAPAROTOMY N/A 01/04/2022   Procedure: EXPLORATORY LAPAROTOMY WITH EXTRAPERITONEAL BLADDER REPAIR;  Surgeon: Raynelle Bring, MD;  Location: [facility];  Service: Urology;  Laterality: N/A;   right finger surgery     as a child   TONSILLECTOMY     TOTAL KNEE ARTHROPLASTY Left 07/28/2017   Procedure: LEFT TOTAL KNEE ARTHROPLASTY;  Surgeon: Renette Butters, MD;  Location: [facility];  Service: Orthopedics;  Laterality: Left;   TOTAL SHOULDER ARTHROPLASTY  05/29/2011   Procedure: TOTAL SHOULDER ARTHROPLASTY;  Surgeon: Metta Clines Supple;  Location: [facility];  Service: Orthopedics;  Laterality: Right;   TOTAL SHOULDER ARTHROPLASTY Left 06/08/2014   DR SUPPLE   TOTAL SHOULDER ARTHROPLASTY Left 06/08/2014   Procedure: LEFT TOTAL SHOULDER ARTHROPLASTY;  Surgeon: Marin Shutter, MD;  Location: [facility];  Service: Orthopedics;  Laterality: Left;   UMBILICAL HERNIA REPAIR N/A 03/11/2019  Procedure: LAPAROSCOPIC UMBILICAL HERNIA;  Surgeon: Ralene Ok, MD;  Location: [facility];  Service: General;  Laterality: N/A;    Family History  Problem Relation Age of Onset   Bradycardia Mother 37       Requiring pacemeaker placement   Coronary artery disease Father 51       Died of myocardial infarction   Coronary artery disease Brother 25       Died of myocardial infarction   Obesity Daughter    Healthy Son     Colon cancer Neg Hx    Esophageal cancer Neg Hx    Stomach cancer Neg Hx    Colon polyps Neg Hx     Social History:  reports that he quit smoking about 11 years ago. His smoking use included cigarettes. He started smoking about 53 years ago. He has a 63.00 pack-year smoking history. He has never used smokeless tobacco. He reports that he does not drink alcohol and does not use drugs.  Allergies: No Known Allergies  Medications: I have reviewed the patient's current medications.  No results found for this or any previous visit (from the past 48 hour(s)).  No results found.  ROS 10 point review of systems is negative except as listed above in HPI.   Physical Exam Blood pressure (!) 156/89, pulse 91, temperature 97.7 F (36.5 C), temperature source Oral, resp. rate 18, height '5\' 6"'$  (1.676 m), weight 74.8 kg, SpO2 91 %. Constitutional: well-developed, well-nourished HEENT: pupils equal, round, reactive to light, 98m b/l, moist conjunctiva, external inspection of ears and nose normal, hearing intact Oropharynx: normal oropharyngeal mucosa, normal dentition Neck: no thyromegaly, trachea midline, no midline cervical tenderness to palpation Chest: breath sounds equal bilaterally, normal respiratory effort, no midline or lateral chest wall tenderness to palpation/deformity Abdomen: soft, NT, no bruising, no hepatosplenomegaly GU: normal male genitalia  Back: no wounds, no thoracic/lumbar spine tenderness to palpation, no thoracic/lumbar spine stepoffs Rectal: deferred Extremities: 2+ radial and pedal pulses bilaterally, intact motor and sensation bilateral UE and LE, no peripheral edema MSK: normal gait/station, no clubbing/cyanosis of fingers/toes, normal ROM of all four extremities Skin: warm, dry, no rashes Psych: normal memory, normal mood/affect     Assessment/Plan: 65M with non-obstructing choledocholithiasis. Plan for ERCP and lap chole. Informed consent was obtained after  detailed explanation of risks, including bleeding, infection, biloma, hematoma, injury to common bile duct, need for IOC to delineate anatomy, and need for conversion to open procedure. All questions answered to the patient's satisfaction. Pending COVID swab due to active respiratory symptoms.    AJesusita Oka MD General and [facility]

## 2022-04-21 NOTE — Progress Notes (Signed)
Initial Nutrition Assessment  DOCUMENTATION CODES:   Severe malnutrition in context of acute illness/injury  INTERVENTION:   Once appropriate for tube feeds, recommend:   Vital HP '@50ml'$ /hr- Initiate at 13m/hr and increase by 162mhr q 8 hours until goal rate is reached.   ProSource TF 20- Give 6056maily via tube, each supplement provides 80kcal and 20g of protein.   Free water flushes 49m11m hours to maintain tube patency   Propofol: 17.95 ml/hr- provides 474kcal/day   Regimen provides 1754kcal/day, 125g/day protein and 1183ml44m of free water.   Liquid MVI daily via tube   Pt at high refeed risk; recommend monitor potassium, magnesium and phosphorus labs daily until stable  NUTRITION DIAGNOSIS:   Severe Malnutrition related to acute illness as evidenced by moderate fat depletion, moderate muscle depletion, percent weight loss.  GOAL:   Provide needs based on ASPEN/SCCM guidelines  MONITOR:   Vent status, Labs, Weight trends, Skin, I & O's, TF tolerance  REASON FOR ASSESSMENT:   Ventilator    ASSESSMENT:   76 y/81male with h/o HLD, CAD, HTN, gout, Afib s/p cardioversion, CHF, PUD, umbilical hernia s/p mesh repair 03/2019 and recent admission for gangrenous cholecystitis requiring IR perc. drain complicated by extraperitoneal bladder rupture with significant clot in the bladder s/p exploratory laparotomy with cystotomy, evacuation of bladder clot and bladder repair 01/04/22 who is now admitted with choledocholithiasis s/p laparoscopic cholecystectomy with intra-operative cholangiogram and adhesiolysis 10/23.  Pt s/p cortrak tube placement today with tip noted in distal stomach  Spoke with pt's son at bedside. Son reports pt with good appetite and oral intake at baseline. Son reports that patient has been having issues with his gallbladder since July and reports that his oral intake has been decreased since July but reports that it has improved some over the past few  weeks. Son reports that patient has had weight loss since July. Per chart, pt is down 25lbs(13%) over the past 3 months with the majority of weight loss occurring in July after his hospital stay. This is significant weight loss. Pt s/p cortrak tube placement today. Plan is to initiate tube feeds today. Pt is at high refeed risk.   Medications reviewed and include: colace, lovenox, miralax, LRS '@75ml'$ /hr, neo-synephrine, propofol    Labs reviewed: K 3.4(L) Cbgs- 119  Patient is currently intubated on ventilator support MV: 6.4 L/min Temp (24hrs), Avg:97.4 F (36.3 C), Min:97.2 F (36.2 C), Max:97.7 F (36.5 C)  Propofol: 17.95 ml/hr- provides 474kcal/day   MAP- >65mmH64mNUTRITION - FOCUSED PHYSICAL EXAM:  Flowsheet Row Most Recent Value  Orbital Region Moderate depletion  Upper Arm Region Moderate depletion  Thoracic and Lumbar Region Mild depletion  Buccal Region No depletion  Temple Region Moderate depletion  Clavicle Bone Region Mild depletion  Clavicle and Acromion Bone Region Mild depletion  Scapular Bone Region Moderate depletion  Dorsal Hand Unable to assess  Patellar Region Moderate depletion  Anterior Thigh Region Moderate depletion  Posterior Calf Region Severe depletion  Edema (RD Assessment) None  Hair Reviewed  Eyes Reviewed  Mouth Reviewed  Skin Reviewed   Diet Order:   Diet Order             Diet NPO time specified  Diet effective now                  EDUCATION NEEDS:   No education needs have been identified at this time  Skin:  Skin Assessment: Reviewed RN Assessment (incisions abdomen)  Last BM:  pta  Height:   Ht Readings from Last 1 Encounters:  04/21/22 '5\' 6"'$  (1.676 m)    Weight:   Wt Readings from Last 1 Encounters:  04/21/22 74.8 kg    Ideal Body Weight:  64.5 kg  BMI:  Body mass index is 26.63 kg/m.  Estimated Nutritional Needs:   Kcal:  1473kcal/day  Protein:  110-125g/day  Fluid:  1.7-1.9L/day  Koleen Distance  MS, RD, LDN Please refer to [facility] for RD and/or RD on-call/weekend/after hours pager

## 2022-04-21 NOTE — Consult Note (Signed)
Urology Consult   Reason for consult: difficult foley  History of Present Illness: [name] is a 76 y.o. who was admitted to the ICU intubated earlier today after lap chole with adhesiolysis. OR staff attempted foley placement and was unable to place catheter.  Mr Vasques has a history of an open repair of bladder injury in July and was last seen in our clinic around 2 months ago.    Past Medical History:  Diagnosis Date   Allergic rhinitis 08/08/2013   takes Loratadine daily    Aortic stenosis 09/08/2011   With mild aortic regurgitation, mean gradient 13 mmHg    Cataract    right but immature   CHF (congestive heart failure) ([place]) 6808   Complication of anesthesia    stopped breathing - during shoulder surgery 2009-2010   Coronary artery disease 10/02/2009   Cardiac cath (October 2007): 20% left main, 60% mid LAD, 60-70% distal LAD, 30-40% first diagonal, 30% circumflex, 40% OM, 30-40% RCA    Degenerative joint disease of shoulder 08/03/2013   Bilateral, s/p right total shoulder arthroplasty 05/29/2011 and left shoulder arthroplasty 81/03/3158   Diastolic dysfunction 45/85/9292   Echo (04/04/2016): Grade I   Diverticulosis 10/07/2013   Seen on colonoscopy in 2007    Erectile dysfunction 05/07/2009   Essential hypertension 08/03/2013   had been on Lisinopril but stopped per MD   First degree AV block 03/14/2016   Gastric ulcer 10/07/2013   Seen on EGD 04/10/2006    Hemorrhoids 10/07/2013   Hyperlipidemia 10/02/2009   Paroxysmal atrial fibrillation ([place]) 07/14/2006   One episode, provoked by alcohol use disorder, briefly anticoagulated with warfarin, then switched to aspirin alone   Sciatica associated with disorder of lumbar spine 08/03/2013   Anterolisthesis with right L 4-5 nerve root compression.  Treated with epidural injections and Physical Therapy.    Seborrheic keratosis 10/07/2013   Tubular adenoma of colon     Past Surgical History:  Procedure Laterality  Date   CARDIAC CATHETERIZATION  2007   CARDIOVERSION     CARDIOVERSION N/A 08/04/2019   Procedure: CARDIOVERSION;  Surgeon: Fay Records, MD;  Location: [facility];  Service: Cardiovascular;  Laterality: N/A;   COLONOSCOPY     CYSTOSCOPY N/A 01/04/2022   Procedure: Consuela Mimes;  Surgeon: Raynelle Bring, MD;  Location: [facility];  Service: Urology;  Laterality: N/A;   EYE SURGERY Bilateral 2022   bilateral cataracts   INSERTION OF MESH N/A 03/11/2019   Procedure: Insertion Of Mesh;  Surgeon: Ralene Ok, MD;  Location: [facility];  Service: General;  Laterality: N/A;   IR EXCHANGE BILIARY DRAIN  02/14/2022   IR PERC CHOLECYSTOSTOMY  12/29/2021   JOINT REPLACEMENT     KNEE ARTHROSCOPY WITH MENISCAL REPAIR Right 01/30/2011   LAPAROTOMY N/A 01/04/2022   Procedure: EXPLORATORY LAPAROTOMY WITH EXTRAPERITONEAL BLADDER REPAIR;  Surgeon: Raynelle Bring, MD;  Location: [facility];  Service: Urology;  Laterality: N/A;   right finger surgery     as a child   TONSILLECTOMY     TOTAL KNEE ARTHROPLASTY Left 07/28/2017   Procedure: LEFT TOTAL KNEE ARTHROPLASTY;  Surgeon: Renette Butters, MD;  Location: [facility];  Service: Orthopedics;  Laterality: Left;   TOTAL SHOULDER ARTHROPLASTY  05/29/2011   Procedure: TOTAL SHOULDER ARTHROPLASTY;  Surgeon: Metta Clines Supple;  Location: [facility];  Service: Orthopedics;  Laterality: Right;   TOTAL SHOULDER ARTHROPLASTY Left 06/08/2014   DR SUPPLE   TOTAL SHOULDER ARTHROPLASTY Left 06/08/2014   Procedure: LEFT TOTAL  SHOULDER ARTHROPLASTY;  Surgeon: Marin Shutter, MD;  Location: [facility];  Service: Orthopedics;  Laterality: Left;   UMBILICAL HERNIA REPAIR N/A 03/11/2019   Procedure: LAPAROSCOPIC UMBILICAL HERNIA;  Surgeon: Ralene Ok, MD;  Location: [facility];  Service: General;  Laterality: N/A;    Current Hospital Medications:  Home Meds:  No current facility-administered medications on file prior to encounter.   Current Outpatient Medications on File Prior to Encounter   Medication Sig Dispense Refill   acetaminophen (TYLENOL) 500 MG tablet Take 1,000 mg by mouth in the morning and at bedtime.     atorvastatin (LIPITOR) 40 MG tablet Take 1 tablet (40 mg total) by mouth daily. 30 tablet 5   carvedilol (COREG) 6.25 MG tablet Take 1 tablet (6.25 mg total) by mouth 2 (two) times daily with a meal. 30 tablet 5   docusate sodium (COLACE) 100 MG capsule Take 100 mg by mouth daily.     ePHEDrine HCl (PRIMATENE PO) Take 1 tablet by mouth daily as needed (wheezing).     finasteride (PROSCAR) 5 MG tablet Take 1 tablet (5 mg total) by mouth daily. 30 tablet 5   loratadine (CLARITIN) 10 MG tablet Take 10 mg by mouth daily as needed (wheezing).     silodosin (RAPAFLO) 8 MG CAPS capsule Take 1 capsule (8 mg total) by mouth daily with breakfast. 30 capsule 3   warfarin (COUMADIN) 5 MG tablet TAKE 1/2 TABLET TO 1 TABLET DAILY BY MOUTH OR AS DIRECTED BY COUMADIN CLINIC (Patient taking differently: Take 2.5 mg by mouth daily. TAKE 1/2 TABLET TO 1 TABLET DAILY BY MOUTH OR AS DIRECTED BY COUMADIN CLINIC) 80 tablet 1   feeding supplement (ENSURE ENLIVE / ENSURE PLUS) LIQD Take 237 mLs by mouth 3 (three) times daily between meals. (Patient not taking: Reported on 04/11/2022) 237 mL 12   sodium chloride flush (NS) 0.9 % SOLN Flush 5 mLs by Intracatheter route daily. (Patient not taking: Reported on 04/11/2022) 300 mL 1     Scheduled Meds:  docusate sodium  100 mg Oral BID   [START ON 04/22/2022] enoxaparin (LOVENOX) injection  40 mg Subcutaneous Q24H   fentaNYL (SUBLIMAZE) injection  25 mcg Intravenous Once   methocarbamol  750 mg Oral QID   polyethylene glycol  17 g Per Tube Daily   Continuous Infusions:  acetaminophen     ceFAZolin     fentaNYL infusion INTRAVENOUS     lactated ringers     methocarbamol (ROBAXIN) IV     phenylephrine (NEO-SYNEPHRINE) Adult infusion     propofol (DIPRIVAN) infusion     PRN Meds:.ceFAZolin, fentaNYL, morphine injection, ondansetron **OR**  ondansetron (ZOFRAN) IV, oxyCODONE  Allergies: No Known Allergies  Family History  Problem Relation Age of Onset   Bradycardia Mother 73       Requiring pacemeaker placement   Coronary artery disease Father 58       Died of myocardial infarction   Coronary artery disease Brother 39       Died of myocardial infarction   Obesity Daughter    Healthy Son    Colon cancer Neg Hx    Esophageal cancer Neg Hx    Stomach cancer Neg Hx    Colon polyps Neg Hx     Social History:  reports that he quit smoking about 11 years ago. His smoking use included cigarettes. He started smoking about 53 years ago. He has a 63.00 pack-year smoking history. He has never used smokeless tobacco. He reports  that he does not drink alcohol and does not use drugs.  ROS: A complete review of systems was performed.  All systems are negative except for pertinent findings as noted.  Physical Exam:  Vital signs in last 24 hours: Temp:  [97.2 F (36.2 C)-97.7 F (36.5 C)] 97.6 F (36.4 C) (10/23 1229) Pulse Rate:  [47-91] 47 (10/23 1330) Resp:  [14-28] 28 (10/23 1330) BP: (104-156)/(51-89) 124/67 (10/23 1330) SpO2:  [91 %-100 %] 100 % (10/23 1330) FiO2 (%):  [60 %-100 %] 60 % (10/23 1220) Weight:  [74.8 kg] 74.8 kg (10/23 0553) Constitutional:  Alert and oriented, No acute distress Cardiovascular: Regular rate and rhythm Respiratory: Normal respiratory effort, Lungs clear bilaterally GI: Abdomen is soft, nontender, nondistended, no abdominal masses GU: No CVA tenderness Neurologic: Grossly intact, no focal deficits Psychiatric: Normal mood and affect  Laboratory Data:  No results for input(s): "WBC", "HGB", "HCT", "PLT" in the last 72 hours.  No results for input(s): "NA", "K", "CL", "GLUCOSE", "BUN", "CALCIUM", "CREATININE" in the last 72 hours.  Invalid input(s): "CO3"   Results for orders placed or performed during the hospital encounter of 04/21/22 (from the past 24 hour(s))  SARS Coronavirus 2  by RT PCR (hospital order, performed in [facility] hospital lab) *cepheid single result test* Anterior Nasal Swab     Status: None   Collection Time: 04/21/22  6:51 AM   Specimen: Anterior Nasal Swab  Result Value Ref Range   SARS Coronavirus 2 by RT PCR NEGATIVE NEGATIVE  Type and screen [place]     Status: None   Collection Time: 04/21/22  9:31 AM  Result Value Ref Range   ABO/RH(D) B NEG    Antibody Screen NEG    Sample Expiration      04/24/2022,2359 Performed at [facility], [place] [address] [place], [place] [address]   Glucose, capillary     Status: Abnormal   Collection Time: 04/21/22 12:22 PM  Result Value Ref Range   Glucose-Capillary 119 (H) 70 - 99 mg/dL   Recent Results (from the past 240 hour(s))  SARS Coronavirus 2 by RT PCR (hospital order, performed in [facility] hospital lab) *cepheid single result test* Anterior Nasal Swab     Status: None   Collection Time: 04/21/22  6:51 AM   Specimen: Anterior Nasal Swab  Result Value Ref Range Status   SARS Coronavirus 2 by RT PCR NEGATIVE NEGATIVE Final    Comment: (NOTE) SARS-CoV-2 target nucleic acids are NOT DETECTED.  The SARS-CoV-2 RNA is generally detectable in upper and lower respiratory specimens during the acute phase of infection. The lowest concentration of SARS-CoV-2 viral copies this assay can detect is 250 copies / mL. A negative result does not preclude SARS-CoV-2 infection and should not be used as the sole basis for treatment or other patient management decisions.  A negative result may occur with improper specimen collection / handling, submission of specimen other than nasopharyngeal swab, presence of viral mutation(s) within the areas targeted by this assay, and inadequate number of viral copies (<250 copies / mL). A negative result must be combined with clinical observations, patient history, and epidemiological information.  Fact Sheet for Patients:    https://www.patel.info/  Fact Sheet for Healthcare Providers: https://hall.com/  This test is not yet approved or  cleared by the [place] FDA and has been authorized for detection and/or diagnosis of SARS-CoV-2 by FDA under an Emergency Use Authorization (EUA).  This EUA will remain  in effect (meaning this test can be used) for the duration of the COVID-19 declaration under Section 564(b)(1) of the Act, 21 U.S.C. section 360bbb-3(b)(1), unless the authorization is terminated or revoked sooner.  Performed at [facility], [place] [address], [place], [place] [address]     Renal Function: Recent Labs    04/15/22 8288  CREATININE 0.90   Estimated Creatinine Clearance: 65 mL/min (by C-G formula based on SCr of 0.9 mg/dL).  Radiologic Imaging: DG Cholangiogram Operative  Result Date: 04/21/2022 CLINICAL DATA:  76 year old male undergoing laparoscopic cholecystectomy with intraoperative cholangiogram. EXAM: INTRAOPERATIVE CHOLANGIOGRAM TECHNIQUE: Cholangiographic images from the C-arm fluoroscopic device were submitted for interpretation post-operatively. Please see the procedural report for the amount of contrast and the fluoroscopy time utilized. FLUOROSCOPY: Radiation Exposure Index (as provided by the fluoroscopic device): 26.25 mGy Kerma reference air kerma COMPARISON:  None Available. FINDINGS: Intraoperative cholangiogram with multiple cine clips and images submitted for review. The images demonstrate cannulation of the cystic duct remanent and opacification of the biliary tree. Multiple small filling defects are present in the distal common bile duct consistent with choledocholithiasis. Filling defects are nonocclusive is contrast material does passed through the ampulla and into the duodenum. IMPRESSION: Positive for choledocholithiasis. Several small nonocclusive and stones are present in the distal common duct. Electronically  Signed   By: Jacqulynn Cadet M.D.   On: 04/21/2022 11:14   DG C-Arm 1-60 Min  Result Date: 04/21/2022 CLINICAL DATA:  Attempted ERCP. EXAM: DG C-ARM 1-60 MIN; DG C-ARM 1-60 MIN-NO REPORT CONTRAST:  None. FLUOROSCOPY: Radiation Exposure Index (if provided by the fluoroscopic device): 5.06 mGy. Number of Acquired Spot Images: 1. COMPARISON:  CT scan of February 01, 2022. FINDINGS: Single intraoperative fluoroscopic image was obtained of the right upper quadrant of the abdomen. This demonstrates endoscopy scope in the expected position of the duodenum. IMPRESSION: Fluoroscopic guidance provided during attempted ERCP. Electronically Signed   By: Marijo Conception M.D.   On: 04/21/2022 10:16   DG C-Arm 1-60 Min-No Report  Result Date: 04/21/2022 Fluoroscopy was utilized by the requesting physician.  No radiographic interpretation.    I independently reviewed the above imaging studies.  Procedure The patient was prepped and draped in the usual sterile fashion. I attempted to insert a 16 [place] coud catheter but met resistance just inside of the meatus.  Ultimately used a hemostat inserted the true meatus to dilate the fossa navicularis.  Thereafter I was able to easily insert a 16 [place] coud catheter.  The balloon was inflated with 10 cc sterile water and it was connected to the appropriate collection bag.  There is immediate return of clear yellow urine.  Impression/Recommendation 76 yo M s/p insertion of foley catheter.   -Foley catheter should remain in place until 10/26, at which time it may be removed per primary team. -please call with any other questions.  Donald Pore MD 04/21/2022, 2:33 PM  [facility]  Pager: [phone]

## 2022-04-21 NOTE — Anesthesia Procedure Notes (Signed)
Procedure Name: Intubation Date/Time: 04/21/2022 8:14 AM  Performed by: Lavell Luster, CRNAPre-anesthesia Checklist: Patient identified, Emergency Drugs available, Suction available, Patient being monitored and Timeout performed Patient Re-evaluated:Patient Re-evaluated prior to induction Oxygen Delivery Method: Circle system utilized Preoxygenation: Pre-oxygenation with 100% oxygen Induction Type: IV induction Ventilation: Mask ventilation without difficulty Laryngoscope Size: Mac and 4 Grade View: Grade II Tube type: Oral Tube size: 7.5 mm Number of attempts: 1 Airway Equipment and Method: Stylet Placement Confirmation: ETT inserted through vocal cords under direct vision, positive ETCO2 and breath sounds checked- equal and bilateral Secured at: 21 cm Tube secured with: Tape Dental Injury: Teeth and Oropharynx as per pre-operative assessment

## 2022-04-21 NOTE — Op Note (Signed)
   Operative Note  Date: 04/21/2022  Procedure: laparoscopic cholecystectomy with intra-operative cholangiogram, adhesiolysis x68mn  Pre-op diagnosis: choledocholithiasis Post-op diagnosis: same  Indication and clinical history: The patient is a 76y.o. year old male with choledocholithiasis  Surgeon: AJesusita Oka MD Assistant: WDema Severin MD  Anesthesiologist: RKalman Shan MD Anesthesia: General  Findings:  Specimen: gallbladder EBL: <5cc Drains/Implants: none  Disposition: PACU to ICU - intubated and hemodynamically stable.  Description of procedure: The patient was positioned supine on the operating room table. Time-out was performed verifying correct patient, procedure, signature of informed consent, and administration of pre-operative antibiotics. General anesthetic induction and intubation were uneventful. The procedure began with an ERCP by Dr. GCarlean Purl please see separate operative note. The ERCP was unsuccessful for cannulation of the CBD due to papilla being within a duodenal diverticulum. The abdomen was then prepped and draped in the usual sterile fashion. Abdominal entry was performed using a Veress needle in the left upper quadrant. After establishing pneumoperitoneum, which the patient tolerated well, the abdominal cavity was inspected and no injury of any intra-abdominal structures was identified. Significant adhesions were noted which were lysed using a combination of blunt dissection and electrocautery. Additional ports were placed under direct visualization and using local anesthetic: two 532mports in the right subcostal region and a 47m37mort in the right peri-umbilical region and a 47mm547mrt in the left mid-abdomen. The patient was re-positioned to reverse Trendelenburg and right side up. Additional adhesiolysis was performed to expose the gallbladder, which was then retracted cephalad. The infundibulum was identified and retracted toward the right lower quadrant. The peritoneum  was incised over the infundibulum and the triangle of Calot dissected to expose the critical view of safety. A clip was placed on the cystic duct close to the neck of the gallbladder. A nick was made in the cystic duct and a cholangiogram catheter threaded. A cholangiogram was obtained and showed good flow of bile into the duodenum, an intact biliary tree, and two filling defects. These filling defects were unable to be cleared even after glucagon administration and vigorous flushing of the duct. The cystic artery and cystic duct were identified as two structures entering the gallbladder, and both were doubly clipped and divided. The gallbladder was dissected off the liver bed using electrocautery and hemostasis of the liver bed was confirmed prior to separation of the final peritoneal attachments of the gallbladder to the liver bed. The gallbladder fossa was irrigated and fluid returned clear. After transection of the final peritoneal attachments, the gallbladder was placed in an endoscopic specimen retrieval bag, removed via the umbilical port site, and sent to pathology as a permanent specimen. The gallbladder fossa was inspected confirming hemostasis, the absence of bile leakage from the cystic duct stump, and correct placement of clips on the cystic artery and cystic duct stumps. The abdomen was desufflated and the fascia of the umbilical port site was closed using the previously placed stay sutures. Additional local anesthetic was administered at the umbilical port site.  The skin of all incisions was closed with 4-0 monocryl. Sterile dressings were applied. All sponge and instrument counts were correct at the conclusion of the procedure. The patient was awakened from anesthesia, extubated uneventfully, and transported to the PACU and then to ICU - intubated and hemodynamically stable.. There were no complications.     AyesJesusita Oka General and [facility]is[facility]

## 2022-04-21 NOTE — Progress Notes (Signed)
Pt transported from PACU bay 6 to 2M10 on full support vent settings. RT and RN x2 accompanied pt without complication. Report given to ICU RT.

## 2022-04-21 NOTE — H&P (Signed)
[facility] History and Physical   Primary Care Physician:  Axel Filler, MD   Reason for Procedure:   CBD stones  Plan:    ERCP and remove CBD stones     HPI: [name] is a 76 y.o. male here for ERCP and lap chole - has perc chole tube and known CBD stones. Has had a cough and is getting a COVID test.  No other c/o   Past Medical History:  Diagnosis Date   Allergic rhinitis 08/08/2013   takes Loratadine daily    Aortic stenosis 09/08/2011   With mild aortic regurgitation, mean gradient 13 mmHg    Cataract    right but immature   CHF (congestive heart failure) ([place]) 1308   Complication of anesthesia    stopped breathing - during shoulder surgery 2009-2010   Coronary artery disease 10/02/2009   Cardiac cath (October 2007): 20% left main, 60% mid LAD, 60-70% distal LAD, 30-40% first diagonal, 30% circumflex, 40% OM, 30-40% RCA    Degenerative joint disease of shoulder 08/03/2013   Bilateral, s/p right total shoulder arthroplasty 05/29/2011 and left shoulder arthroplasty 65/78/4696   Diastolic dysfunction 29/52/8413   Echo (04/04/2016): Grade I   Diverticulosis 10/07/2013   Seen on colonoscopy in 2007    Erectile dysfunction 05/07/2009   Essential hypertension 08/03/2013   had been on Lisinopril but stopped per MD   First degree AV block 03/14/2016   Gastric ulcer 10/07/2013   Seen on EGD 04/10/2006    Hemorrhoids 10/07/2013   Hyperlipidemia 10/02/2009   Paroxysmal atrial fibrillation ([place]) 07/14/2006   One episode, provoked by alcohol use disorder, briefly anticoagulated with warfarin, then switched to aspirin alone   Sciatica associated with disorder of lumbar spine 08/03/2013   Anterolisthesis with right L 4-5 nerve root compression.  Treated with epidural injections and Physical Therapy.    Seborrheic keratosis 10/07/2013   Tubular adenoma of colon     Past Surgical History:  Procedure Laterality Date   CARDIAC CATHETERIZATION   2007   CARDIOVERSION     CARDIOVERSION N/A 08/04/2019   Procedure: CARDIOVERSION;  Surgeon: Fay Records, MD;  Location: [facility];  Service: Cardiovascular;  Laterality: N/A;   COLONOSCOPY     CYSTOSCOPY N/A 01/04/2022   Procedure: Consuela Mimes;  Surgeon: Raynelle Bring, MD;  Location: [facility];  Service: Urology;  Laterality: N/A;   EYE SURGERY Bilateral 2022   bilateral cataracts   INSERTION OF MESH N/A 03/11/2019   Procedure: Insertion Of Mesh;  Surgeon: Ralene Ok, MD;  Location: [facility];  Service: General;  Laterality: N/A;   IR EXCHANGE BILIARY DRAIN  02/14/2022   IR PERC CHOLECYSTOSTOMY  12/29/2021   JOINT REPLACEMENT     KNEE ARTHROSCOPY WITH MENISCAL REPAIR Right 01/30/2011   LAPAROTOMY N/A 01/04/2022   Procedure: EXPLORATORY LAPAROTOMY WITH EXTRAPERITONEAL BLADDER REPAIR;  Surgeon: Raynelle Bring, MD;  Location: [place] [facility];  Service: Urology;  Laterality: N/A;   right finger surgery     as a child   TONSILLECTOMY     TOTAL KNEE ARTHROPLASTY Left 07/28/2017   Procedure: LEFT TOTAL KNEE ARTHROPLASTY;  Surgeon: Renette Butters, MD;  Location: [facility];  Service: Orthopedics;  Laterality: Left;   TOTAL SHOULDER ARTHROPLASTY  05/29/2011   Procedure: TOTAL SHOULDER ARTHROPLASTY;  Surgeon: Metta Clines Supple;  Location: [facility];  Service: Orthopedics;  Laterality: Right;   TOTAL SHOULDER ARTHROPLASTY Left 06/08/2014   DR SUPPLE   TOTAL SHOULDER ARTHROPLASTY Left 06/08/2014  Procedure: LEFT TOTAL SHOULDER ARTHROPLASTY;  Surgeon: Marin Shutter, MD;  Location: [facility] [facility];  Service: Orthopedics;  Laterality: Left;   UMBILICAL HERNIA REPAIR N/A 03/11/2019   Procedure: LAPAROSCOPIC UMBILICAL HERNIA;  Surgeon: Ralene Ok, MD;  Location: [place];  Service: General;  Laterality: N/A;    Prior to Admission medications   Medication Sig Start Date End Date Taking? Authorizing Provider  acetaminophen (TYLENOL) 500 MG tablet Take 1,000 mg by mouth in the morning and at bedtime.   Yes [provider]  atorvastatin (LIPITOR) 40 MG tablet Take 1 tablet (40 mg total) by mouth daily. 01/20/22  Yes Lacinda Axon, MD  carvedilol (COREG) 6.25 MG tablet Take 1 tablet (6.25 mg total) by mouth 2 (two) times daily with a meal. 01/20/22  Yes Amponsah, Charisse March, MD  docusate sodium (COLACE) 100 MG capsule Take 100 mg by mouth daily.   Yes [provider]  ePHEDrine HCl (PRIMATENE PO) Take 1 tablet by mouth daily as needed (wheezing).   Yes [provider]  finasteride (PROSCAR) 5 MG tablet Take 1 tablet (5 mg total) by mouth daily. 01/21/22  Yes Lacinda Axon, MD  loratadine (CLARITIN) 10 MG tablet Take 10 mg by mouth daily as needed (wheezing).   Yes [provider]  silodosin (RAPAFLO) 8 MG CAPS capsule Take 1 capsule (8 mg total) by mouth daily with breakfast. 02/27/22  Yes Axel Filler, MD  warfarin (COUMADIN) 5 MG tablet TAKE 1/2 TABLET TO 1 TABLET DAILY BY MOUTH OR AS DIRECTED BY COUMADIN CLINIC Patient taking differently: Take 2.5 mg by mouth daily. TAKE 1/2 TABLET TO 1 TABLET DAILY BY MOUTH OR AS DIRECTED BY COUMADIN CLINIC 02/03/22  Yes Crenshaw, Denice Bors, MD  feeding supplement (ENSURE ENLIVE / ENSURE PLUS) LIQD Take 237 mLs by mouth 3 (three) times daily between meals. Patient not taking: Reported on 04/11/2022 01/20/22   Lacinda Axon, MD  sodium chloride flush (NS) 0.9 % SOLN Flush 5 mLs by Intracatheter route daily. Patient not taking: Reported on 04/11/2022 01/20/22   Pasty Spillers, PA    Current Facility-Administered Medications  Medication Dose Route Frequency Provider Last Rate Last Admin   0.9 %  sodium chloride infusion   Intravenous Continuous Gatha Mayer, MD       acetaminophen (TYLENOL) tablet 1,000 mg  1,000 mg Oral On Call to OR Jesusita Oka, MD       ceFAZolin (ANCEF) 2-4 GM/100ML-% IVPB            ceFAZolin (ANCEF) IVPB 2g/100 mL premix  2 g Intravenous On Call to [place], MD        Chlorhexidine Gluconate Cloth 2 % PADS 6 each  6 each Topical Once Jesusita Oka, MD       And   Chlorhexidine Gluconate Cloth 2 % PADS 6 each  6 each Topical Once Jesusita Oka, MD       lactated ringers infusion   Intravenous Continuous Ellender, Karyl Kinnier, MD        Allergies as of 03/19/2022   (No Known Allergies)    Family History  Problem Relation Age of Onset   Bradycardia Mother 67       Requiring pacemeaker placement   Coronary artery disease Father 6       Died of myocardial infarction   Coronary artery disease Brother 44       Died of myocardial infarction   Obesity  Daughter    Healthy Son    Colon cancer Neg Hx    Esophageal cancer Neg Hx    Stomach cancer Neg Hx    Colon polyps Neg Hx     Social History   Socioeconomic History   Marital status: Married    Spouse name: Marjolin- Margie   Number of children: 2   Years of education: Not on file   Highest education level: Not on file  Occupational History   Occupation: Retired  Tobacco Use   Smoking status: Former    Packs/day: 1.50    Years: 42.00    Total pack years: 63.00    Types: Cigarettes    Start date: 1970    Quit date: 07/14/2010    Years since quitting: 11.7   Smokeless tobacco: Never   Tobacco comments:    quit smoking about 72yr ago  Vaping Use   Vaping Use: Never used  Substance and Sexual Activity   Alcohol use: No    Comment: no alcohol since 07   Drug use: No   Sexual activity: Never    Birth control/protection: None  Other Topics Concern   Not on file  [place].  Former CTeacher, early years/pre   Social Determinants of Health   Financial Resource Strain: Not on file  Food Insecurity: Not on file  Transportation Needs: Not on file  Physical Activity: Not on file  Stress: Not on file  Social Connections: Not on file  Intimate Partner Violence: Not on file    Review of Systems:  All  other review of systems negative except as mentioned in the HPI.  Physical Exam: Vital signs BP (!) 156/89   Pulse 91   Temp 97.7 F (36.5 C) (Oral)   Resp 18   Ht '5\' 6"'$  (1.676 m)   Wt 74.8 kg   SpO2 91%   BMI 26.63 kg/m   General:   Alert,  Well-developed, well-nourished, pleasant and cooperative in NAD Lungs:  Clear throughout to auscultation.   Heart:  Regular rate and rhythm; no murmurs, clicks, rubs,  or gallops. Abdomen:  Soft, nontender and nondistended. Normal bowel sounds.   Neuro/Psych:  Alert and cooperative. Normal mood and affect. A and O x 3   '@Christel Bai'$  ESimonne Maffucci MD, FA[facility]zo [facility] [phone](pager) 04/21/2022 7:32 AM@

## 2022-04-22 ENCOUNTER — Encounter (HOSPITAL_COMMUNITY): Payer: Self-pay | Admitting: Surgery

## 2022-04-22 LAB — CBC
HCT: 37.4 % — ABNORMAL LOW (ref 39.0–52.0)
Hemoglobin: 11.7 g/dL — ABNORMAL LOW (ref 13.0–17.0)
MCH: 30.5 pg (ref 26.0–34.0)
MCHC: 31.3 g/dL (ref 30.0–36.0)
MCV: 97.7 fL (ref 80.0–100.0)
Platelets: 133 10*3/uL — ABNORMAL LOW (ref 150–400)
RBC: 3.83 MIL/uL — ABNORMAL LOW (ref 4.22–5.81)
RDW: 14 % (ref 11.5–15.5)
WBC: 7.9 10*3/uL (ref 4.0–10.5)
nRBC: 0 % (ref 0.0–0.2)

## 2022-04-22 LAB — COMPREHENSIVE METABOLIC PANEL
ALT: 29 U/L (ref 0–44)
AST: 71 U/L — ABNORMAL HIGH (ref 15–41)
Albumin: 3.1 g/dL — ABNORMAL LOW (ref 3.5–5.0)
Alkaline Phosphatase: 61 U/L (ref 38–126)
Anion gap: 7 (ref 5–15)
BUN: 14 mg/dL (ref 8–23)
CO2: 22 mmol/L (ref 22–32)
Calcium: 8.7 mg/dL — ABNORMAL LOW (ref 8.9–10.3)
Chloride: 112 mmol/L — ABNORMAL HIGH (ref 98–111)
Creatinine, Ser: 1 mg/dL (ref 0.61–1.24)
GFR, Estimated: >60 mL/min (ref >60)
Glucose, Bld: 80 mg/dL (ref 70–99)
Potassium: 4.1 mmol/L (ref 3.5–5.1)
Sodium: 141 mmol/L (ref 135–145)
Total Bilirubin: 0.6 mg/dL (ref 0.3–1.2)
Total Protein: 6 g/dL — ABNORMAL LOW (ref 6.5–8.1)

## 2022-04-22 LAB — SURGICAL PATHOLOGY

## 2022-04-22 LAB — MAGNESIUM: Magnesium: 1.8 mg/dL (ref 1.7–2.4)

## 2022-04-22 LAB — PHOSPHORUS: Phosphorus: 4.9 mg/dL — ABNORMAL HIGH (ref 2.5–4.6)

## 2022-04-22 LAB — GLUCOSE, CAPILLARY
Glucose-Capillary: 132 mg/dL — ABNORMAL HIGH (ref 70–99)
Glucose-Capillary: 133 mg/dL — ABNORMAL HIGH (ref 70–99)
Glucose-Capillary: 70 mg/dL (ref 70–99)
Glucose-Capillary: 74 mg/dL (ref 70–99)
Glucose-Capillary: 95 mg/dL (ref 70–99)

## 2022-04-22 LAB — TRIGLYCERIDES: Triglycerides: 131 mg/dL (ref <150)

## 2022-04-22 MED ORDER — FINASTERIDE 5 MG PO TABS
5.0000 mg | ORAL_TABLET | Freq: Every day | ORAL | Status: DC
  Administered 2022-04-22 – 2022-04-24 (×3): 5 mg via ORAL
  Filled 2022-04-22 (×3): qty 1

## 2022-04-22 MED ORDER — ORAL CARE MOUTH RINSE: 15.0000 mL | OROMUCOSAL | Status: DC | PRN

## 2022-04-22 MED ORDER — TAMSULOSIN HCL 0.4 MG PO CAPS
0.4000 mg | ORAL_CAPSULE | Freq: Every day | ORAL | Status: DC
  Administered 2022-04-22 – 2022-04-24 (×3): 0.4 mg via ORAL
  Filled 2022-04-22 (×3): qty 1

## 2022-04-22 MED ORDER — ORAL CARE MOUTH RINSE
15.0000 mL | OROMUCOSAL | Status: DC
  Administered 2022-04-22 – 2022-04-24 (×6): 15 mL via OROMUCOSAL

## 2022-04-22 MED ORDER — CARVEDILOL 3.125 MG PO TABS
3.1250 mg | ORAL_TABLET | Freq: Two times a day (BID) | ORAL | Status: DC
  Administered 2022-04-22 – 2022-04-24 (×6): 3.125 mg via ORAL
  Filled 2022-04-22 (×7): qty 1

## 2022-04-22 NOTE — Evaluation (Signed)
Occupational Therapy Evaluation Patient Details Name: [name] MRN: [id number] DOB: [birth year] Today's Date: 04/22/2022   History of Present Illness 76 yo admitted 10/23 due to Choledocholithiasis - s/p lap chole with unsuccessful IOC and unsuccessful ERCP 10/23 remained intubated with foley placed post op. Extubated 10/24. PMhx: HLD, CAD, AS, HTN, gout, Afib, bladder repair   Clinical Impression   PTA, pt lives with family, typically Modified Independent with ADLs/mobility at home. Pt appears fairly close to baseline with min guard for transfers, Setup for UB ADL and Supervision for LB ADLs. Emphasis on breathing techniques with fair carryover, energy conservation with ADLs, pulse ox for O2 monitoring at home, and gradual improvement in activity tolerance. Pt's wife present, family able to assist prn at home and no OT follow up needed at DC.  Pt with questionable SpO2 readings during session with O2 90-93% on 3 L O2 and on RA, desats to 85% on RA though quickly recovers with pursed lip breathing to 90%. While pleth fairly stable, pt also noted with very cold digits.     Recommendations for follow up therapy are one component of a multi-disciplinary discharge planning process, led by the attending physician.  Recommendations may be updated based on patient status, additional functional criteria and insurance authorization.   Follow Up Recommendations  No OT follow up    Assistance Recommended at Discharge PRN  Patient can return home with the following Assistance with cooking/housework    Functional Status Assessment  Patient has had a recent decline in their functional status and demonstrates the ability to make significant improvements in function in a reasonable and predictable amount of time.  Equipment Recommendations  None recommended by OT    Recommendations for Other Services       Precautions / Restrictions Precautions Precautions: Other (comment) Precaution  Comments: monitor O2 Restrictions Weight Bearing Restrictions: No      Mobility Bed Mobility               General bed mobility comments: up in chair    Transfers Overall transfer level: Needs assistance Equipment used: None Transfers: Sit to/from Stand Sit to Stand: Supervision                  Balance Overall balance assessment: Mild deficits observed, not formally tested                                         ADL either performed or assessed with clinical judgement   ADL Overall ADL's : Needs assistance/impaired Eating/Feeding: Independent   Grooming: Set up;Standing   Upper Body Bathing: Set up;Sitting   Lower Body Bathing: Supervison/ safety;Sit to/from stand;Sitting/lateral leans   Upper Body Dressing : Set up;Sitting   Lower Body Dressing: Supervision/safety;Sitting/lateral leans;Sit to/from stand   Toilet Transfer: Min guard;Ambulation   Toileting- Clothing Manipulation and Hygiene: Supervision/safety;Sit to/from stand;Sitting/lateral lean       Functional mobility during ADLs: Min guard General ADL Comments: Pt with questionable cardiopulmonary deficits but overall moving well, denies concerns regarding ADLs/mobility at home. Discussed energy conservation strategies, pulse ox to monitor O2 prn     Vision Baseline Vision/History: 1 Wears glasses Ability to See in Adequate Light: 0 Adequate Patient Visual Report: No change from baseline Vision Assessment?: No apparent visual deficits     Perception     Praxis      Pertinent Vitals/Pain Pain  Assessment Pain Assessment: No/denies pain     Hand Dominance Right   Extremity/Trunk Assessment Upper Extremity Assessment Upper Extremity Assessment: Overall WFL for tasks assessed   Lower Extremity Assessment Lower Extremity Assessment: Defer to PT evaluation   Cervical / Trunk Assessment Cervical / Trunk Assessment: Normal   Communication  Communication Communication: No difficulties   Cognition Arousal/Alertness: Awake/alert Behavior During Therapy: WFL for tasks assessed/performed Overall Cognitive Status: Within Functional Limits for tasks assessed                                       General Comments  Wife present    Exercises     Shoulder Instructions      Home Living Family/patient expects to be discharged to:: Private residence Living Arrangements: Spouse/significant other;Children Available Help at Discharge: Family;Available 24 hours/day Type of Home: House Home Access: Stairs to enter [employer] of Steps: 1   Home Layout: One level     Bathroom Shower/Tub: Occupational psychologist: Standard Bathroom Accessibility: Yes How Accessible: Accessible via walker Home Equipment: [place] (2 wheels);BSC/3in1;Cane - single point;Shower seat   Additional Comments: still going to the gym      Prior Functioning/Environment Prior Level of Function : Independent/Modified Independent                        OT Problem List: Cardiopulmonary status limiting activity;Decreased activity tolerance;Impaired balance (sitting and/or standing)      OT Treatment/Interventions: Self-care/ADL training;Therapeutic exercise;Energy conservation;DME and/or AE instruction;Therapeutic activities;Patient/family education    OT Goals(Current goals can be found in the care plan section) Acute Rehab OT Goals Patient Stated Goal: hopeful to go home by tomorrow, not need home O2 OT Goal Formulation: With patient/family Time For Goal Achievement: 05/06/22 Potential to Achieve Goals: Good  OT Frequency: Min 2X/week    Co-evaluation              AM-PAC OT "6 Clicks" Daily Activity     Outcome Measure Help from another person eating meals?: None Help from another person taking care of personal grooming?: A Little Help from another person toileting, which includes using  toliet, bedpan, or urinal?: A Little Help from another person bathing (including washing, rinsing, drying)?: A Little Help from another person to put on and taking off regular upper body clothing?: A Little Help from another person to put on and taking off regular lower body clothing?: A Little 6 Click Score: 19   End of Session Equipment Utilized During Treatment: Oxygen Nurse Communication: Mobility status;Other (comment) (O2)  Activity Tolerance: Patient tolerated treatment well Patient left: in chair;with call bell/phone within reach;with family/visitor present  OT Visit Diagnosis: Other abnormalities of gait and mobility (R26.89)                Time: 6283-1517 OT Time Calculation (min): 17 min Charges:  OT General Charges $OT Visit: 1 Visit OT Evaluation $OT Eval Moderate Complexity: 1 Mod  Malachy Chamber, OTR/L Acute Rehab Services Office: [phone]   Layla Maw 04/22/2022, 1:03 PM

## 2022-04-22 NOTE — Evaluation (Signed)
Physical Therapy Evaluation Patient Details Name: [name] MRN: [id number] DOB: 04-25-1946 Today's Date: 04/22/2022  History of Present Illness  76 yo admitted 10/23 due to Choledocholithiasis - s/p lap chole with unsuccessful IOC and unsuccessful ERCP 10/23 remained intubated with foley placed post op. Extubated 10/24. PMhx: HLD, CAD, AS, HTN, gout, Afib, bladder repair  Clinical Impression  Pt very pleasant and eager to move. Pt not on home O2 and required 3L throughout gait to maintain SpO2 90-94%. Pt with sore knee limiting gait but has needed DME at home. Pt with decreased activity tolerance and cardiopulmonary function who will benefit from acute therapy to maximize mobility, safety and function. Encouraged mobility with nursing staff.   HR 93-140 Audible wheezing with gait 127/80 SpO2 90-94% on RA        Recommendations for follow up therapy are one component of a multi-disciplinary discharge planning process, led by the attending physician.  Recommendations may be updated based on patient status, additional functional criteria and insurance authorization.  Follow Up Recommendations No PT follow up      Assistance Recommended at Discharge PRN  Patient can return home with the following  A little help with walking and/or transfers    Equipment Recommendations None recommended by PT  Recommendations for Other Services       Functional Status Assessment Patient has had a recent decline in their functional status and demonstrates the ability to make significant improvements in function in a reasonable and predictable amount of time.     Precautions / Restrictions Precautions Precautions: Other (comment) Precaution Comments: watch sats Restrictions Weight Bearing Restrictions: No      Mobility  Bed Mobility Overal bed mobility: Needs Assistance Bed Mobility: Supine to Sit     Supine to sit: Min guard, HOB elevated     General bed mobility comments: HOB  20 degrees with guarding for lines and safety    Transfers Overall transfer level: Needs assistance   Transfers: Sit to/from Stand Sit to Stand: Min guard           General transfer comment: guarding for lines    Ambulation/Gait Ambulation/Gait assistance: Min guard Gait Distance (Feet): 200 Feet Assistive device: IV Pole, None Gait Pattern/deviations: Step-through pattern, Decreased stride length   Gait velocity interpretation: >2.62 ft/sec, indicative of community ambulatory   General Gait Details: pt with decreased speed, reporting stiff painful right knee. Pt walked 41' without AD and remainder with LUE on IV pole for additional support, no LOB  Stairs            Wheelchair Mobility    Modified Rankin (Stroke Patients Only)       Balance Overall balance assessment: Mild deficits observed, not formally tested                                           Pertinent Vitals/Pain Pain Assessment Pain Assessment: 0-10 Pain Score: 4  Pain Location: Rt knee Pain Descriptors / Indicators: Aching, Sore Pain Intervention(s): Limited activity within patient's tolerance, Monitored during session, Repositioned    Home Living Family/patient expects to be discharged to:: Private residence Living Arrangements: Spouse/significant other;Children Available Help at Discharge: Family;Available 24 hours/day Type of Home: House Home Access: Stairs to enter   [employer] of Steps: 1   Home Layout: One level Home Equipment: Conservation officer, nature (2 wheels);BSC/3in1;Cane - single point;Shower seat Additional  Comments: still going to the gym    Prior Function Prior Level of Function : Independent/Modified Independent                     Hand Dominance        Extremity/Trunk Assessment   Upper Extremity Assessment Upper Extremity Assessment: Overall WFL for tasks assessed    Lower Extremity Assessment Lower Extremity Assessment: Overall  WFL for tasks assessed    Cervical / Trunk Assessment Cervical / Trunk Assessment: Normal  Communication   Communication: No difficulties  Cognition Arousal/Alertness: Awake/alert Behavior During Therapy: WFL for tasks assessed/performed Overall Cognitive Status: Within Functional Limits for tasks assessed                                          General Comments      Exercises     Assessment/Plan    PT Assessment Patient needs continued PT services  PT Problem List Decreased mobility;Decreased activity tolerance;Cardiopulmonary status limiting activity       PT Treatment Interventions Gait training;Functional mobility training;Therapeutic activities;Patient/family education;Therapeutic exercise;DME instruction    PT Goals (Current goals can be found in the Care Plan section)  Acute Rehab PT Goals Patient Stated Goal: return home PT Goal Formulation: With patient/family Time For Goal Achievement: 04/29/22 Potential to Achieve Goals: Good    Frequency Min 3X/week     Co-evaluation               AM-PAC PT "6 Clicks" Mobility  Outcome Measure Help needed turning from your back to your side while in a flat bed without using bedrails?: None Help needed moving from lying on your back to sitting on the side of a flat bed without using bedrails?: None Help needed moving to and from a bed to a chair (including a wheelchair)?: A Little Help needed standing up from a chair using your arms (e.g., wheelchair or bedside chair)?: A Little Help needed to walk in hospital room?: A Little Help needed climbing 3-5 steps with a railing? : A Little 6 Click Score: 20    End of Session Equipment Utilized During Treatment: Gait belt;Oxygen Activity Tolerance: Patient tolerated treatment well Patient left: in chair;with call bell/phone within reach;with family/visitor present Nurse Communication: Mobility status PT Visit Diagnosis: Other abnormalities of gait and  mobility (R26.89);Difficulty in walking, not elsewhere classified (R26.2)    Time: 1497-0263 PT Time Calculation (min) (ACUTE ONLY): 24 min   Charges:   PT Evaluation $PT Eval Moderate Complexity: 1 Mod PT Treatments $Therapeutic Activity: 8-22 mins        [name], PT Acute Rehabilitation Services Office: [phone]   Sandy Salaam Normon Pettijohn 04/22/2022, 11:53 AM

## 2022-04-22 NOTE — Procedures (Signed)
Extubation Procedure Note  Patient Details:   Name: URI TURNBOUGH DOB: March 15, 1946 MRN: [id number]   Airway Documentation:    Vent end date: 04/22/22 Vent end time: 0917   Evaluation  O2 sats: stable throughout Complications: No apparent complications Patient did tolerate procedure well. Bilateral Breath Sounds: Clear   Yes  Patient extubated per MD order. Positive cuff leak. No stridor noted. Vitals are stable on 3L [place]. RN at bedside.  Herbie Saxon Treina Arscott 04/22/2022, 9:17 AM

## 2022-04-22 NOTE — Op Note (Signed)
[facility] Patient Name: [name] Procedure Date : 04/21/2022 MRN: [id number] Attending MD: Gatha Mayer , MD Date of Birth: 1945-07-06 CSN: [id number] Age: 76 Admit Type: Inpatient Procedure:                ERCP Indications:              Bile duct stone(s), For therapy of bile duct                            stone(s) Providers:                Gatha Mayer, MD, Doristine Johns, RN, Darliss Cheney, Technician Referring MD:              Medicines:                General Anesthesia, Ancef 9150 mg IV Complications:            No immediate complications. Estimated Blood Loss:     Estimated blood loss: none. Procedure:                Pre-Anesthesia Assessment:                           - Prior to the procedure, a History and Physical                            was performed, and patient medications and                            allergies were reviewed. The patient's tolerance of                            previous anesthesia was also reviewed. The risks                            and benefits of the procedure and the sedation                            options and risks were discussed with the patient.                            All questions were answered, and informed consent                            was obtained. Prior Anticoagulants: The patient                            last took Coumadin (warfarin) 5 days prior to the                            procedure. ASA Grade Assessment: III - A patient  with severe systemic disease. After reviewing the                            risks and benefits, the patient was deemed in                            satisfactory condition to undergo the procedure.                           After obtaining informed consent, the scope was                            passed under direct vision. Throughout the                            procedure, the patient's blood pressure, pulse,  and                            oxygen saturations were monitored continuously. The                            [id number]-Q190V ([id number]) Olympus duodenoscope was                            introduced through the mouth, and used to inject                            contrast into and used to locate the major papilla.                            The ERCP was technically difficult and complex due                            to unusual anatomy. The patient tolerated the                            procedure well. Scope In: Scope Out: Findings:      Esophagus not seen well. A few diminutive to small innocent-apperaing       proximal gastric polyps were seen in stomach.      Duodenum revealed a large periampullary diverticulum and the papilla was       within, intermittently draining bile.      Scout film showed percutaneous cholecystotomy tube.      Despite manipulating the papilla into the duodenal lumen the position       was very difficult to work with - used long position, smaller caliber       sphincterotome + wire and also moved to left lateral decubitis but was       not able to cannulate after 1 hour of attempts so procedure aborted. Impression:               - A few small proximal gastric polyps,                            innocent-appearing. Resection not  attempted.                           - Periampullary diverticulum with papilla insoide -                            unable to cannulate despite repositioning the                            papilla by scope and catheter manipualtion                           - Images were taken but resolution off - too bright                            so not able to visualize anything Recommendation:           - Await outcome of laparoscopic cholecystectomy and                            IOC                           Consider elective reassessment of gastric polyps                            though small and innocent in appearance                            I have updated wife re: unsuccessful ERCP Procedure Code(s):        --- Professional ---                           [phone], Endoscopic retrograde                            cholangiopancreatography (ERCP); diagnostic,                            including collection of specimen(s) by brushing or                            washing, when performed (separate procedure) Diagnosis Code(s):        --- Professional ---                           K31.7, Polyp of stomach and duodenum                           K80.50, Calculus of bile duct without cholangitis                            or cholecystitis without obstruction CPT copyright 2019 [employer]. All rights reserved. The codes documented in this report are preliminary and upon coder review may  be revised to meet current compliance requirements. Gatha Mayer, MD 04/21/2022 9:58:25 AM This report has been  signed electronically. Number of Addenda: 0

## 2022-04-22 NOTE — Progress Notes (Signed)
General Surgery Follow Up Note  Subjective:    Overnight Issues:   Objective:  Vital signs for last 24 hours: Temp:  [94.6 F (34.8 C)-98.4 F (36.9 C)] 98.4 F (36.9 C) (10/24 0736) Pulse Rate:  [47-78] 67 (10/24 0816) Resp:  [13-28] 14 (10/24 0816) BP: (96-152)/(47-87) 97/57 (10/24 0630) SpO2:  [90 %-100 %] 99 % (10/24 0816) FiO2 (%):  [40 %-100 %] 40 % (10/24 0816) Weight:  [79.3 kg] 79.3 kg (10/24 0500)  Hemodynamic parameters for last 24 hours:    Intake/Output from previous day: 10/23 0701 - 10/24 0700 In: 2861.7 [I.V.:2400.7; NG/GT:161; IV Piggyback:300] Out: 500 [Urine:500]  Intake/Output this shift: No intake/output data recorded.  Vent settings for last 24 hours: Vent Mode: PRVC FiO2 (%):  [40 %-100 %] 40 % Set Rate:  [14 bmp] 14 bmp Vt Set:  [510 mL] 510 mL PEEP:  [5 cmH20-10 cmH20] 5 cmH20 Plateau Pressure:  [14 cmH20-20 cmH20] 14 cmH20  Physical Exam:  Gen: comfortable, no distress Neuro: non-focal exam HEENT: PERRL Neck: supple CV: RRR Pulm: unlabored breathing Abd: soft, NT, incisions cdi GU: clear yellow urine Extr: wwp, no edema   Results for orders placed or performed during the hospital encounter of 04/21/22 (from the past 24 hour(s))  Type and screen [place]     Status: None   Collection Time: 04/21/22  9:31 AM  Result Value Ref Range   ABO/RH(D) B NEG    Antibody Screen NEG    Sample Expiration      04/24/2022,2359 Performed at [facility], [address], [place], [place] [address]   Glucose, capillary     Status: Abnormal   Collection Time: 04/21/22 12:22 PM  Result Value Ref Range   Glucose-Capillary 119 (H) 70 - 99 mg/dL  I-STAT 7, (LYTES, BLD GAS, ICA, H+H)     Status: Abnormal   Collection Time: 04/21/22  3:16 PM  Result Value Ref Range   pH, Arterial 7.342 (L) 7.35 - 7.45   pCO2 arterial 39.5 32 - 48 mmHg   pO2, Arterial 114 (H) 83 - 108 mmHg   Bicarbonate 21.4 20.0 - 28.0 mmol/L   TCO2  23 22 - 32 mmol/L   O2 Saturation 98 %   Acid-base deficit 4.0 (H) 0.0 - 2.0 mmol/L   Sodium 143 135 - 145 mmol/L   Potassium 3.8 3.5 - 5.1 mmol/L   Calcium, Ion 1.23 1.15 - 1.40 mmol/L   HCT 31.0 (L) 39.0 - 52.0 %   Hemoglobin 10.5 (L) 13.0 - 17.0 g/dL   Collection site RADIAL, ALLEN'S TEST ACCEPTABLE    Drawn by RT    Sample type ARTERIAL   Comprehensive metabolic panel     Status: Abnormal   Collection Time: 04/21/22  5:12 PM  Result Value Ref Range   Sodium 140 135 - 145 mmol/L   Potassium 3.6 3.5 - 5.1 mmol/L   Chloride 109 98 - 111 mmol/L   CO2 23 22 - 32 mmol/L   Glucose, Bld 109 (H) 70 - 99 mg/dL   BUN 12 8 - 23 mg/dL   Creatinine, Ser 0.94 0.61 - 1.24 mg/dL   Calcium 8.4 (L) 8.9 - 10.3 mg/dL   Total Protein 6.0 (L) 6.5 - 8.1 g/dL   Albumin 3.1 (L) 3.5 - 5.0 g/dL   AST 106 (H) 15 - 41 U/L   ALT 35 0 - 44 U/L   Alkaline Phosphatase 63 38 - 126 U/L  Total Bilirubin 0.9 0.3 - 1.2 mg/dL   GFR, Estimated >60 >60 mL/min   Anion gap 8 5 - 15  MRSA Next Gen by PCR, Nasal     Status: None   Collection Time: 04/21/22  6:42 PM   Specimen: Nasal Mucosa; Nasal Swab  Result Value Ref Range   MRSA by PCR Next Gen NOT DETECTED NOT DETECTED  Glucose, capillary     Status: None   Collection Time: 04/21/22 11:44 PM  Result Value Ref Range   Glucose-Capillary 89 70 - 99 mg/dL  Glucose, capillary     Status: None   Collection Time: 04/22/22  3:42 AM  Result Value Ref Range   Glucose-Capillary 74 70 - 99 mg/dL  Comprehensive metabolic panel     Status: Abnormal   Collection Time: 04/22/22  5:54 AM  Result Value Ref Range   Sodium 141 135 - 145 mmol/L   Potassium 4.1 3.5 - 5.1 mmol/L   Chloride 112 (H) 98 - 111 mmol/L   CO2 22 22 - 32 mmol/L   Glucose, Bld 80 70 - 99 mg/dL   BUN 14 8 - 23 mg/dL   Creatinine, Ser 1.00 0.61 - 1.24 mg/dL   Calcium 8.7 (L) 8.9 - 10.3 mg/dL   Total Protein 6.0 (L) 6.5 - 8.1 g/dL   Albumin 3.1 (L) 3.5 - 5.0 g/dL   AST 71 (H) 15 - 41 U/L   ALT 29  0 - 44 U/L   Alkaline Phosphatase 61 38 - 126 U/L   Total Bilirubin 0.6 0.3 - 1.2 mg/dL   GFR, Estimated >60 >60 mL/min   Anion gap 7 5 - 15  CBC     Status: Abnormal   Collection Time: 04/22/22  5:54 AM  Result Value Ref Range   WBC 7.9 4.0 - 10.5 K/uL   RBC 3.83 (L) 4.22 - 5.81 MIL/uL   Hemoglobin 11.7 (L) 13.0 - 17.0 g/dL   HCT 37.4 (L) 39.0 - 52.0 %   MCV 97.7 80.0 - 100.0 fL   MCH 30.5 26.0 - 34.0 pg   MCHC 31.3 30.0 - 36.0 g/dL   RDW 14.0 11.5 - 15.5 %   Platelets 133 (L) 150 - 400 K/uL   nRBC 0.0 0.0 - 0.2 %  Triglycerides     Status: None   Collection Time: 04/22/22  5:54 AM  Result Value Ref Range   Triglycerides 131 <150 mg/dL  Phosphorus     Status: Abnormal   Collection Time: 04/22/22  5:54 AM  Result Value Ref Range   Phosphorus 4.9 (H) 2.5 - 4.6 mg/dL  Magnesium     Status: None   Collection Time: 04/22/22  5:54 AM  Result Value Ref Range   Magnesium 1.8 1.7 - 2.4 mg/dL  Glucose, capillary     Status: None   Collection Time: 04/22/22  7:32 AM  Result Value Ref Range   Glucose-Capillary 70 70 - 99 mg/dL    Assessment & Plan: The plan of care was discussed with the bedside nurse for the day, who is in agreement with this plan and no additional concerns were raised.   Present on Admission:  Choledocholithiasis    LOS: 1 day   Additional comments:I reviewed the patient's new clinical lab test results.   and I reviewed the patients new imaging test results.    Choledocholithiasis - s/p lap chole with unsuccessful IOC and unsuccessful ERCP 10/23. Tbili normal, so expect stones continue to be non-obstructive. Will  plan for outpatient labs and discussion about risks/benefits of repeat ERCP VDRF - PSV, extubation today AF - on coumadin at home, will plan for extended hold of coumadin with LMWH bridge as o/p H/o bladder repair - s/p Foley placement by Urology immediately post-op, required urethral dilation, plan for Foley to stay until 10/26 at minimum FEN - TF,  adv to goal vs diet if extubated DVT - SCDs, LMWH Dispo - ICU   Critical Care Total Time: 35 minutes  Jesusita Oka, MD Trauma & General Surgery Please use [email or web address] to contact on call provider  04/22/2022  *Care during the described time interval was provided by me. I have reviewed this patient's available data, including medical history, events of note, physical examination and test results as part of my evaluation.

## 2022-04-22 NOTE — Progress Notes (Signed)
Patient's son at bedside and MD Bobbye Morton updated patient's son with plan of care.   Patient currently weaning on ventilator.  If patient tolerates ventilator wean, plan to extubate this morning and if all goes well transfer out of ICU today and hopeful discharge home 04/23/2022.  Patient's son called male family member on speaker phone with RN in the room and updated on plan of care.  Male family member stated "he is definitely leaving tomorrow.  If all is okay, he can leave AMA tonight".   Patient nodded "yes" to leaving AMA.     Patient's son discussed with RN that patient had previous long hospitalization in July and patient is eager to get of of the hospital.   MD Lovick notified of family's conversation.

## 2022-04-23 ENCOUNTER — Other Ambulatory Visit (HOSPITAL_COMMUNITY): Payer: Self-pay

## 2022-04-23 ENCOUNTER — Other Ambulatory Visit: Payer: Self-pay

## 2022-04-23 LAB — COMPREHENSIVE METABOLIC PANEL
ALT: 84 U/L — ABNORMAL HIGH (ref 0–44)
AST: 265 U/L — ABNORMAL HIGH (ref 15–41)
Albumin: 2.7 g/dL — ABNORMAL LOW (ref 3.5–5.0)
Alkaline Phosphatase: 130 U/L — ABNORMAL HIGH (ref 38–126)
Anion gap: 5 (ref 5–15)
BUN: 14 mg/dL (ref 8–23)
CO2: 24 mmol/L (ref 22–32)
Calcium: 8.4 mg/dL — ABNORMAL LOW (ref 8.9–10.3)
Chloride: 109 mmol/L (ref 98–111)
Creatinine, Ser: 0.77 mg/dL (ref 0.61–1.24)
GFR, Estimated: >60 mL/min (ref >60)
Glucose, Bld: 120 mg/dL — ABNORMAL HIGH (ref 70–99)
Potassium: 3.5 mmol/L (ref 3.5–5.1)
Sodium: 138 mmol/L (ref 135–145)
Total Bilirubin: 1.2 mg/dL (ref 0.3–1.2)
Total Protein: 5.7 g/dL — ABNORMAL LOW (ref 6.5–8.1)

## 2022-04-23 LAB — GLUCOSE, CAPILLARY
Glucose-Capillary: 102 mg/dL — ABNORMAL HIGH (ref 70–99)
Glucose-Capillary: 116 mg/dL — ABNORMAL HIGH (ref 70–99)
Glucose-Capillary: 110 mg/dL — ABNORMAL HIGH (ref 70–99)
Glucose-Capillary: 100 mg/dL — ABNORMAL HIGH (ref 70–99)
Glucose-Capillary: 122 mg/dL — ABNORMAL HIGH (ref 70–99)
Glucose-Capillary: 121 mg/dL — ABNORMAL HIGH (ref 70–99)

## 2022-04-23 LAB — MAGNESIUM: Magnesium: 1.7 mg/dL (ref 1.7–2.4)

## 2022-04-23 LAB — PHOSPHORUS: Phosphorus: 2.9 mg/dL (ref 2.5–4.6)

## 2022-04-23 MED ORDER — DOCUSATE SODIUM 100 MG PO CAPS
100.0000 mg | ORAL_CAPSULE | Freq: Two times a day (BID) | ORAL | 1 refills | Status: AC
  Filled 2022-04-23: qty 60, 30d supply, fill #0

## 2022-04-23 MED ORDER — METHOCARBAMOL 750 MG PO TABS
750.0000 mg | ORAL_TABLET | Freq: Four times a day (QID) | ORAL | 1 refills | Status: DC
  Filled 2022-04-23: qty 120, 30d supply, fill #0

## 2022-04-23 MED ORDER — ACETAMINOPHEN 500 MG PO TABS
1000.0000 mg | ORAL_TABLET | Freq: Four times a day (QID) | ORAL | 3 refills | Status: AC
  Filled 2022-04-23: qty 60, 8d supply, fill #0

## 2022-04-23 MED ORDER — ADULT MULTIVITAMIN W/MINERALS CH
1.0000 | ORAL_TABLET | Freq: Every day | ORAL | Status: DC
  Filled 2022-04-23: qty 1

## 2022-04-23 MED ORDER — LORATADINE 10 MG PO TABS
10.0000 mg | ORAL_TABLET | Freq: Every day | ORAL | Status: DC
  Administered 2022-04-23 – 2022-04-24 (×2): 10 mg via ORAL
  Filled 2022-04-23 (×2): qty 1

## 2022-04-23 MED ORDER — PEPTAMEN 1.5 CAL PO LIQD
1000.0000 mL | ORAL | Status: DC
  Administered 2022-04-23 – 2022-04-24 (×2): 1000 mL
  Filled 2022-04-23 (×3): qty 1000

## 2022-04-23 MED ORDER — IBUPROFEN 600 MG PO TABS
600.0000 mg | ORAL_TABLET | Freq: Four times a day (QID) | ORAL | 1 refills | Status: DC
  Filled 2022-04-23: qty 120, 30d supply, fill #0

## 2022-04-23 MED ORDER — ACETAMINOPHEN 500 MG PO TABS
1000.0000 mg | ORAL_TABLET | Freq: Four times a day (QID) | ORAL | Status: DC
  Administered 2022-04-23 – 2022-04-24 (×3): 1000 mg via ORAL
  Filled 2022-04-23 (×4): qty 2

## 2022-04-23 MED ORDER — TAMSULOSIN HCL 0.4 MG PO CAPS
0.4000 mg | ORAL_CAPSULE | Freq: Every day | ORAL | 2 refills | Status: DC
  Filled 2022-04-23: qty 30, 30d supply, fill #0

## 2022-04-23 MED ORDER — ENOXAPARIN SODIUM 80 MG/0.8ML IJ SOSY
1.0000 mg/kg | PREFILLED_SYRINGE | Freq: Two times a day (BID) | INTRAMUSCULAR | 0 refills | Status: DC
  Filled 2022-04-23: qty 22.4, 14d supply, fill #0

## 2022-04-23 MED ORDER — ENOXAPARIN SODIUM 40 MG/0.4ML IJ SOSY
40.0000 mg | PREFILLED_SYRINGE | INTRAMUSCULAR | Status: DC
  Administered 2022-04-24: 40 mg via SUBCUTANEOUS
  Filled 2022-04-23: qty 0.4

## 2022-04-23 NOTE — Progress Notes (Signed)
Received pt from icu, pt alert and oriented x4, sitting at bedside eating dinner, vs wnl as charted. Tube feeds continue as ordered.

## 2022-04-23 NOTE — Progress Notes (Addendum)
   Patient Name: [name] Date of Encounter: 04/23/2022, 11:06 AM    Subjective  Off vent since yesterday - for dc No abdominal pain   Objective  BP (!) 154/78   Pulse 79   Temp 97.9 F (36.6 C) (Axillary)   Resp 20   Ht '5\' 6"'$  (1.676 m)   Wt 79.3 kg   SpO2 94%   BMI 28.22 kg/m  Chronically ill wm Nasoenteric tube in place  Recent Labs  Lab 04/21/22 1712 04/22/22 0554 04/23/22 0741  AST 106* 71* 265*  ALT 35 29 84*  ALKPHOS 63 61 130*  BILITOT 0.9 0.6 1.2  PROT 6.0* 6.0* 5.7*  ALBUMIN 3.1* 3.1* 2.7*        Assessment and Plan  Choledocholithiasis - failed ERCP 10/23 - periampullary diverticulum S/p lap chole and percutaneous cholecystostomy tube removal 10/23  He does not have biliary obstruction so far but LFT;s are up some   I will arrange GI f/u and come up with a plan to reattempt ERCP, most likely - we may want him to improve health status further before scheduling ERCP given prolonged post-op ventilator requirement  Gatha Mayer, MD, [facility] See Shea Evans on call - gastroenterology for best contact person 04/23/2022 11:06 AM   Addendum  Communication w/ Dr. Bobbye Morton re: LFT increase - agree could represent the start of problems with CBD obstruction from stones but could also be post-op issue and unrelated. Will f/u tomorrow and see where things are going. I could try ERCP again though not ideal option but not sure Dr. Rush Landmark could be available  Also has small gastric polyps that might need biopsy next procedure

## 2022-04-23 NOTE — Discharge Instructions (Addendum)
For one week, take the lovenox injections (shots) INSTEAD of coumadin. Please have your labs drawn on October 31st or November 1st. Call the office [phone]) to discuss your lab results and whether to restart coumadin or continue lovenox. Keep your primary care doctor appointment. Reschedule your coumadin clinic (INR check) appointment to three days after restarting coumadin. If you have trouble urinating, please call your Urologist.    [facility]  LAPAROSCOPIC SURGERY: POST OP INSTRUCTIONS Always review your discharge instruction sheet given to you by the facility where your surgery was performed. IF YOU HAVE DISABILITY OR FAMILY LEAVE FORMS, YOU MUST BRING THEM TO THE OFFICE FOR PROCESSING.   DO NOT GIVE THEM TO YOUR DOCTOR.  PAIN CONTROL  Pain regimen: take over-the-counter tylenol (acetaminophen) '1000mg'$  every six hours, the prescription ibuprofen ('600mg'$ ) every six hours and the robaxin (methocarbamol) '750mg'$  every six hours. With all three of these, you should be taking something every two hours. Example: tylenol ( acetaminophen) at 8am, ibuprofen at 10am, robaxin (methocarbamol) at 12pm, tylenol (acetaminophen) again at 2pm, ibuprofen again at 4pm, robaxin (methocarbamol) at 6pm. You also have a prescription for oxycodone, which should be taken if the tylenol (acetaminophen), ibuprofen, and robaxin (methocarbamol) are not enough to control your pain. You may take the oxycodone as frequently as every four hours as needed, but if you are taking the other medications as above, you should not need the oxycodone this frequently. You have also been given a prescription for colace (docusate) which is a stool softener. Please take this as prescribed because the oxycodone can cause constipation and the colace (docusate) will minimize or prevent constipation. Do not drive while taking or under the influence of the oxycodone as it is a narcotic medication. Use ice packs to help  control pain. If you need a refill on your pain medication, please contact your pharmacy.  They will contact our office to request authorization. Prescriptions will not be filled after 5pm or on week-ends.  HOME MEDICATIONS Take your usually prescribed medications unless otherwise directed.  DIET You should follow a light diet the first few days after arrival home.  Be sure to include lots of fluids daily.   CONSTIPATION It is common to experience some constipation after surgery and if you are taking pain medication.  Increasing fluid intake and taking a stool softener (such as Colace) will usually help or prevent this problem from occurring.  A mild laxative (Milk of Magnesia or Miralax) should be taken according to package instructions if there are no bowel movements after 48 hours.  WOUND/INCISION CARE Most patients will experience some swelling and bruising in the area of the incisions.  Ice packs will help.  Swelling and bruising can take several days to resolve.  May shower beginning 04/22/2022.  Do not peel off or scrub skin glue. May allow warm soapy water to run over incision, then rinse and pat dry.  Do not soak in any water (tubs, hot tubs, pools, lakes, oceans) for one week.   ACTIVITIES You may resume regular (light) daily activities beginning the next day--such as daily self-care, walking, climbing stairs--gradually increasing activities as tolerated.  You may have sexual intercourse when it is comfortable.   No lifting greater than 5 pounds for six weeks.  You may drive when you are no longer taking narcotic pain medication, you can comfortably wear a seatbelt, and you can safely maneuver your car and apply brakes.  FOLLOW-UP You should see your doctor in  the office for a follow-up appointment approximately 2-3 weeks after your surgery.  You should have been given your post-op/follow-up appointment when your surgery was scheduled.  If you did not receive a post-op/follow-up  appointment, make sure that you call for this appointment within a day or two after you arrive home to insure a convenient appointment time.  WHEN TO CALL YOUR DOCTOR: Fever over 101.5 Inability to urinate Continued bleeding from incision. Increased pain, redness, or drainage from the incision. Increasing abdominal pain  The clinic staff is available to answer your questions during regular business hours.  Please don't hesitate to call and ask to speak to one of the nurses for clinical concerns.  If you have a medical emergency, go to the nearest emergency room or call 911.  A surgeon from [facility] is always on call at the hospital. [address], [place], [place], [place]  [address] ? [address], [place], [place]   [address] [phone] ? [phone] ? FAX [phone] Web site: [email or web address]

## 2022-04-23 NOTE — Progress Notes (Signed)
Occupational Therapy Treatment Patient Details Name: [name] MRN: [id number] DOB: Feb 07, 1946 Today's Date: 04/23/2022   History of present illness 76 yo admitted 10/23 due to Choledocholithiasis - s/p lap chole with unsuccessful IOC and unsuccessful ERCP 10/23 remained intubated with foley placed post op. Extubated 10/24. PMhx: HLD, CAD, AS, HTN, gout, Afib, bladder repair   OT comments  Per MD request, pt seen for additional OT session prior to planned DC home today. Pt mobilizing well in room using RW w/ assist only for line mgmt. Pt able to maintain sats fairly well in low 90s on 1 L O2 with brief desats to 87/88%. Emphasis on breathing techniques, energy conservation strategies (handout provided for pt/daughter to review), and monitoring O2 sats with pulse ox at home (especially as pt does not plan to use home O2). Anticipate good progress with continued mobility without need for OT follow-up at DC.    Recommendations for follow up therapy are one component of a multi-disciplinary discharge planning process, led by the attending physician.  Recommendations may be updated based on patient status, additional functional criteria and insurance authorization.    Follow Up Recommendations  No OT follow up    Assistance Recommended at Discharge PRN  Patient can return home with the following  Assistance with cooking/housework   Equipment Recommendations  None recommended by OT    Recommendations for Other Services      Precautions / Restrictions Precautions Precautions: Fall Precaution Comments: monitor O2 Restrictions Weight Bearing Restrictions: No       Mobility Bed Mobility               General bed mobility comments: received after ambulating with PT    Transfers Overall transfer level: Needs assistance Equipment used: Rolling walker (2 wheels) Transfers: Sit to/from Stand Sit to Stand: Supervision           General transfer comment: from recliner      Balance Overall balance assessment: Needs assistance Sitting-balance support: No upper extremity supported Sitting balance-Leahy Scale: Normal     Standing balance support: No upper extremity supported Standing balance-Leahy Scale: Fair                             ADL either performed or assessed with clinical judgement   ADL Overall ADL's : Needs assistance/impaired     Grooming: Supervision/safety;Standing;Wash/dry face Grooming Details (indicate cue type and reason): no LOB, good mindfulness of lines                             Functional mobility during ADLs: Min guard;Rolling walker (2 wheels) General ADL Comments: Reinforced energy conservation strategies (handout provided), breathing techniques. Pt able to mobilize well with RW in room, bend to pick items up from floor (though needed cues to avoid holding breath).    Extremity/Trunk Assessment Upper Extremity Assessment Upper Extremity Assessment: Overall WFL for tasks assessed   Lower Extremity Assessment Lower Extremity Assessment: Defer to PT evaluation        Vision   Vision Assessment?: No apparent visual deficits   Perception     Praxis      Cognition Arousal/Alertness: Awake/alert Behavior During Therapy: WFL for tasks assessed/performed Overall Cognitive Status: Within Functional Limits for tasks assessed  Exercises      Shoulder Instructions       General Comments Daughter entering at end of session. Pt reports he does not plan to wear O2 at home - at the very least, educated pt to use pulse ox to monitor sats at home, implement seated rest breaks/pursed lip breathing for recovery to maintain low 90s if possible and notify MDs if O2 does not sustain at a safe level.    Pertinent Vitals/ Pain       Pain Assessment Pain Assessment: No/denies pain  Home Living Family/patient expects to be discharged to:: Private  residence Living Arrangements: Spouse/significant other;Children                                      Prior Functioning/Environment              Frequency  Min 2X/week        Progress Toward Goals  OT Goals(current goals can now be found in the care plan section)  Progress towards OT goals: Progressing toward goals  Acute Rehab OT Goals Patient Stated Goal: go home today, not wear O2 OT Goal Formulation: With patient/family Time For Goal Achievement: 05/06/22 Potential to Achieve Goals: Good ADL Goals Pt Will Transfer to Toilet: with modified independence;ambulating Additional ADL Goal #1: Pt to demo ability to implement pursed lip breathing independently prn during ADLs/mobility to maintain SpO2 sats Additional ADL Goal #2: Pt to improve standing activity tolerance > 10 min during functional tasks without rest break  Plan Discharge plan remains appropriate    Co-evaluation                 AM-PAC OT "6 Clicks" Daily Activity     Outcome Measure   Help from another person eating meals?: None Help from another person taking care of personal grooming?: A Little Help from another person toileting, which includes using toliet, bedpan, or urinal?: A Little Help from another person bathing (including washing, rinsing, drying)?: A Little Help from another person to put on and taking off regular upper body clothing?: A Little Help from another person to put on and taking off regular lower body clothing?: A Little 6 Click Score: 19    End of Session Equipment Utilized During Treatment: Oxygen;Gait belt;Rolling walker (2 wheels)  OT Visit Diagnosis: Other abnormalities of gait and mobility (R26.89)   Activity Tolerance Patient tolerated treatment well   Patient Left in chair;with call bell/phone within reach;with family/visitor present   Nurse Communication Mobility status;Other (comment) (O2)        Time: 4580-9983 OT Time Calculation (min):  21 min  Charges: OT General Charges $OT Visit: 1 Visit OT Treatments $Self Care/Home Management : 8-22 mins  [name], OTR/L Acute Rehab Services Office: [phone]   [name] 04/23/2022, 11:34 AM

## 2022-04-23 NOTE — Progress Notes (Addendum)
Nutrition Follow-up  DOCUMENTATION CODES:   Severe malnutrition in context of acute illness/injury  INTERVENTION:  Advance to new goal regimen via Cortrak: -Initiate Peptamen 1.5 at 45 mL/hour and after 8 hours advance to goal rate of 55 mL/hour x 24 hours (1320 mL goal daily volume). -Continue PROSource TF20 60 mL once daily per tube. -Provides: 2060 kcal, 110 grams of protein, 1014 mL H2O daily.  Continue to monitor potassium, phosphorus, and magnesium daily and replace as needed as patient remains at risk for refeeding syndrome.  Will change liquid multivitamin per tube to adult multivitamin tablet PO once daily. New goal TF regimen meets 100% RDIs for vitamins/minerals but pt would benefit from long term multivitamin supplementation with current nutritional status.  Continue regular diet as tolerated.  Provide Carnation Breakfast Essentials (chocolate) in whole milk po TID with trays, each supplement provides 290 kcal and 13 grams of protein.  Provide Magic cup vanilla po BID with lunch and dinner, each supplement provides 290 kcal and 9 grams of protein.  NUTRITION DIAGNOSIS:   Severe Malnutrition related to acute illness as evidenced by moderate fat depletion, moderate muscle depletion, percent weight loss.  Ongoing.  GOAL:   Provide needs based on ASPEN/SCCM guidelines  Progressing.  MONITOR:   Vent status, Labs, Weight trends, Skin, I & O's, TF tolerance  REASON FOR ASSESSMENT:   Ventilator    ASSESSMENT:   76 y/o male with h/o HLD, CAD, HTN, gout, Afib s/p cardioversion, CHF, PUD, umbilical hernia s/p mesh repair 03/2019 and recent admission for gangrenous cholecystitis requiring IR perc. drain complicated by extraperitoneal bladder rupture with significant clot in the bladder s/p exploratory laparotomy with cystotomy, evacuation of bladder clot and bladder repair 01/04/22 who is now admitted with choledocholithiasis s/p laparoscopic cholecystectomy with  intra-operative cholangiogram and adhesiolysis 10/23.  10/23: s/p laparoscopic cholecystectomy with intra-operative cholangiogram (unsuccessful per MD note), adhesiolysis x 30 minutes; s/p unsuccessful ERCP; Cortrak tube placed 10/24: extubated; diet advanced to regular  Met with patient and his daughter at bedside. He reports his appetite remains decreased. So far today he has been able to tolerate bites of [place] toast and bacon at breakfast and 100% of egg salad sandwich with lemonade at lunch. RD estimating pt has had approximately 376 kcal and 14 grams of protein. Pt endorses some abdominal pain and nausea. Denies emesis. He is unsure of when his last BM was but endorses he is passing flatus today. Pt reports he told MD he would be unable to drink Ensure as he has not tolerated them before. He reports plan is to continue enteral nutrition via TF at this time due to this in addition to eating regular diet. After discussing oral nutrition supplement options available, patient is willing to try [employer] in whole milk and Magic Cup with trays.  Most recent wt of 79.3 kg on 04/22/22 is +4.5 kg from wt documented on admission. Question if admission wt was reported, but also noted positive I/Os since admission. Will continue to monitor wt trend.  Enteral Access: Cortrak placed 10/23 in left nare secured at 71 cm with bridle; terminates in distal stomach per abdominal x-ray 10/23  TF regimen: Vital High Protein running at 40 mL/hour (at time of RD assessment so has not yet advanced to goal of 50 mL/hour) + PROSource TF20 60 mL daily per tube Provides: 1040 kcal, 104 grams of protein, 806 mL H2O daily  UOP: 1385 mL or 0.7 mL/kg/hr  I/O: +4173.2 mL since admission  Medications reviewed and include: carvedilol, Colace, liquid multivitamin, LR at 75 mL/hour  Labs reviewed: CBG 102-133 past 24 hrs, AST 265, ALT 84, Total bilirubin 1.2 (trending up). Potassium, phosphorus, and  magnesium WNL.  Initially pt had order for discharge today that has now been discontinued. Noted plan per MD note is to continue tube feeds in addition to regular diet in setting of malnutrition status at baseline. Per MD note staying one more day to monitor total bilirubin trend. Will re-estimate nutrition needs as patient now s/p extubation.  Diet Order:   Diet Order             Diet regular Room service appropriate? Yes; Fluid consistency: Thin  Diet effective now                   EDUCATION NEEDS:   No education needs have been identified at this time  Skin:  Skin Assessment: Reviewed RN Assessment (incisions abdomen)  Last BM:  04/21/22 per chart (no BM characteristics documented)  Height:   Ht Readings from Last 1 Encounters:  04/21/22 '5\' 6"'  (1.676 m)    Weight:   Wt Readings from Last 1 Encounters:  04/22/22 79.3 kg   Ideal Body Weight:  64.5 kg  BMI:  Body mass index is 28.22 kg/m.  Estimated Nutritional Needs:   Kcal:  2000-2200  Protein:  110-125g/day  Fluid:  2-2.2 L/day  Loanne Drilling, MS, RD, LDN, CNSC Pager number available on Amion

## 2022-04-23 NOTE — Progress Notes (Signed)
General Surgery Follow Up Note  Subjective:    Overnight Issues:   Objective:  Vital signs for last 24 hours: Temp:  [97.7 F (36.5 C)-98.8 F (37.1 C)] 97.7 F (36.5 C) (10/25 1117) Pulse Rate:  [74-96] 83 (10/25 1300) Resp:  [17-30] 21 (10/25 1300) BP: (127-155)/(59-90) 148/70 (10/25 1300) SpO2:  [89 %-97 %] 93 % (10/25 1300)  Hemodynamic parameters for last 24 hours:    Intake/Output from previous day: 10/24 0701 - 10/25 0700 In: 3067.8 [P.O.:350; I.V.:1818.6; NG/GT:899.2] Out: 1385 [Urine:1385]  Intake/Output this shift: Total I/O In: 1282.7 [P.O.:360; I.V.:450; NG/GT:472.7] Out: 750 [Urine:750]  Vent settings for last 24 hours:    Physical Exam:  Gen: comfortable, no distress Neuro: non-focal exam HEENT: PERRL Neck: supple CV: RRR Pulm: unlabored breathing Abd: soft, NT, incisions cdi GU: clear yellow urine, foley Extr: wwp, no edema   Results for orders placed or performed during the hospital encounter of 04/21/22 (from the past 24 hour(s))  Glucose, capillary     Status: Abnormal   Collection Time: 04/22/22  7:51 PM  Result Value Ref Range   Glucose-Capillary 133 (H) 70 - 99 mg/dL  Glucose, capillary     Status: Abnormal   Collection Time: 04/22/22 11:43 PM  Result Value Ref Range   Glucose-Capillary 132 (H) 70 - 99 mg/dL  Glucose, capillary     Status: Abnormal   Collection Time: 04/23/22  3:39 AM  Result Value Ref Range   Glucose-Capillary 102 (H) 70 - 99 mg/dL  Glucose, capillary     Status: Abnormal   Collection Time: 04/23/22  7:39 AM  Result Value Ref Range   Glucose-Capillary 116 (H) 70 - 99 mg/dL  Phosphorus     Status: None   Collection Time: 04/23/22  7:41 AM  Result Value Ref Range   Phosphorus 2.9 2.5 - 4.6 mg/dL  Magnesium     Status: None   Collection Time: 04/23/22  7:41 AM  Result Value Ref Range   Magnesium 1.7 1.7 - 2.4 mg/dL  Comprehensive metabolic panel     Status: Abnormal   Collection Time: 04/23/22  7:41 AM   Result Value Ref Range   Sodium 138 135 - 145 mmol/L   Potassium 3.5 3.5 - 5.1 mmol/L   Chloride 109 98 - 111 mmol/L   CO2 24 22 - 32 mmol/L   Glucose, Bld 120 (H) 70 - 99 mg/dL   BUN 14 8 - 23 mg/dL   Creatinine, Ser 0.77 0.61 - 1.24 mg/dL   Calcium 8.4 (L) 8.9 - 10.3 mg/dL   Total Protein 5.7 (L) 6.5 - 8.1 g/dL   Albumin 2.7 (L) 3.5 - 5.0 g/dL   AST 265 (H) 15 - 41 U/L   ALT 84 (H) 0 - 44 U/L   Alkaline Phosphatase 130 (H) 38 - 126 U/L   Total Bilirubin 1.2 0.3 - 1.2 mg/dL   GFR, Estimated >60 >60 mL/min   Anion gap 5 5 - 15  Glucose, capillary     Status: Abnormal   Collection Time: 04/23/22 11:13 AM  Result Value Ref Range   Glucose-Capillary 110 (H) 70 - 99 mg/dL    Assessment & Plan: The plan of care was discussed with the bedside nurse for the day, who is in agreement with this plan and no additional concerns were raised.   Present on Admission:  Choledocholithiasis    LOS: 2 days   Additional comments:I reviewed the patient's new clinical lab test results.  and I reviewed the patients new imaging test results.    Choledocholithiasis - s/p lap chole with unsuccessful IOC and unsuccessful ERCP 10/23. Tbili normal, but uptrending, so will keep one additional day and recheck labs in AM. GI engaged for possible need for repeat ERCP based on labwork. VDRF - extubated, mild hypoxia with ambulation, continue PT/OT AF - on coumadin at home, will plan for extended hold of coumadin with LMWH bridge as o/p H/o bladder repair - s/p Foley placement by Urology immediately post-op, required urethral dilation, plan for Foley to stay until 10/26 at minimum FEN - TF and reg diet due to malnourished state DVT - SCDs, LMWH Dispo - TTF     Richard Oka, MD Trauma & General Surgery Please use [email or web address] to contact on call provider  04/23/2022  *Care during the described time interval was provided by me. I have reviewed this patient's available data, including medical history,  events of note, physical examination and test results as part of my evaluation.

## 2022-04-23 NOTE — Progress Notes (Signed)
   I have made him NPO and turned off TF's both after MN  while we wait to see what happens with LFT's in setting of CBD stones.  Gatha Mayer, MD, [facility] See Shea Evans on call - gastroenterology for best contact person 04/23/2022 5:16 PM

## 2022-04-23 NOTE — TOC Transition Note (Signed)
Discharge medications (6) are being stored in the Transitions of Care [facility]) Pharmacy on the second floor until patient is ready for discharge.

## 2022-04-23 NOTE — TOC Initial Note (Addendum)
Transition of Care [facility]) - Initial/Assessment Note    Patient Details  Name: [name] MRN: [id number] Date of Birth: 1946/02/22  Transition of Care [facility]) CM/SW Contact:    Sharin Mons, RN Phone Number: 04/23/2022, 12:33 PM  Clinical Narrative:                 NCM @ bedside with pt and daughter, Asencion Partridge to discuss d/c planning needs. Pt gives ok for NCM to discuss with daughter. NCM shared  home  oxygen need. Daughter stated he doesn't need home oxygen.States this has happen before. States home oxygen was setup and pt didn't use. Daughter states he will be fine going home without oxygen and they will not provide a credit card to secure  home oxygen. Pt in agreement with daughter , MD made aware.   TOC team will continue to monitor and assist with needs...   04/23/2022 1500 Pt resides with wife. PTA  states  active with [facility]. Pt agreeable to continued services @ d/c with provider. Orders placed for resumption of service, RN,PT,OT. DME @ home , cane.  Expected Discharge Plan: Home/Self Care Barriers to Discharge: Continued Medical Work up   Patient Goals and CMS Choice     Choice offered to / list presented to : Patient  Expected Discharge Plan and Services Expected Discharge Plan: Home/Self Care         Expected Discharge Date: 04/23/22                                    Prior Living Arrangements/Services                       Activities of Daily Living Home Assistive Devices/Equipment: Kasandra Knudsen (specify quad or straight), Eyeglasses, Dentures (specify type) ADL Screening (condition at time of admission) Patient's cognitive ability adequate to safely complete daily activities?: Yes Is the patient deaf or have difficulty hearing?: Yes Does the patient have difficulty seeing, even when wearing glasses/contacts?: Yes Does the patient have difficulty concentrating, remembering, or making decisions?: No Patient able to express  need for assistance with ADLs?: Yes Does the patient have difficulty dressing or bathing?: No Independently performs ADLs?: Yes (appropriate for developmental age) Does the patient have difficulty walking or climbing stairs?: Yes Weakness of Legs: None Weakness of Arms/Hands: None  Permission Sought/Granted                  Emotional Assessment              Admission diagnosis:  Status post cholecystectomy [Z90.49] Choledocholithiasis [K80.50] Patient Active Problem List   Diagnosis Date Noted   Status post cholecystectomy 04/21/2022   Choledocholithiasis 04/21/2022   Encounter for biliary drainage tube placement 01/11/2022   Extraperitoneal rupture of bladder 01/11/2022   Acquired thrombophilia ([place]) 04/22/2019   Encounter for therapeutic drug monitoring 03/28/2019   Atrial fibrillation ([place]) 03/04/2019   Flat foot, acquired, left 01/24/2019   Gout 01/24/2019   Prediabetes 01/24/2019   Osteoarthritis of both knees 12/12/2014   Tubular adenoma 10/07/2013   Seborrheic keratosis 10/07/2013   Healthcare maintenance 10/07/2013   Allergic rhinitis 08/08/2013   Essential hypertension 08/03/2013   Sciatica associated with disorder of lumbar spine 08/03/2013   Aortic stenosis 09/08/2011   Hyperlipidemia 10/02/2009   CAD (coronary artery disease) 10/02/2009   PCP:  Axel Filler, MD Pharmacy:  [place], [place] [address] Phone: [phone] Fax: [phone]  [place] [place] - [address] [place]) [place] [address] Phone: [phone] Fax: [phone]  [facility] [address] [place] [address] Phone: [phone] Fax: [phone]     Social Determinants of Health (SDOH) Interventions    Readmission Risk Interventions     No data to display

## 2022-04-23 NOTE — Progress Notes (Signed)
Physical Therapy Treatment Patient Details Name: [name] MRN: [id number] DOB: 28-Jul-1945 Today's Date: 04/23/2022   History of Present Illness 76 yo admitted 10/23 due to Choledocholithiasis - s/p lap chole with unsuccessful IOC and unsuccessful ERCP 10/23 remained intubated with foley placed post op. Extubated 10/24. PMhx: HLD, CAD, AS, HTN, gout, Afib, bladder repair    PT Comments    Pt making good progress. He is ambulating 200' steadily and transfers with supervision for lines.  Pt does have significant wheezing with activity - reports this is his baseline.  He did require 1 L O2 to maintain sats >90% with activity. Encouraged ambulation every 1-2 hours at home during day, rest break as needed, and pursed lip breathing.  O2: Pt reports has had O2 at home in past but did not use.  Would prefer not have at home.  He was 94% rest on 2 L.  Placed on RA and sats 90%  on RA.  Ambulated on RA with sats dropping to 86% (did have pt stop and relax hand for good pleth), placed on 1 L and recovered within 1 min.  Completed ambulation on 1 L with sats 90%.  Left on 1 L at rest sitting sats 96%  SATURATION QUALIFICATIONS: (This note is used to comply with regulatory documentation for home oxygen)  Patient Saturations on Room Air at Rest = 90%  Patient Saturations on Room Air while Ambulating = 86%  Patient Saturations on 1 Liters of oxygen while Ambulating = 90%  Please briefly explain why patient needs home oxygen:To maintain oxygen sats >90% with ambulation.   Recommendations for follow up therapy are one component of a multi-disciplinary discharge planning process, led by the attending physician.  Recommendations may be updated based on patient status, additional functional criteria and insurance authorization.  Follow Up Recommendations  No PT follow up     Assistance Recommended at Discharge PRN  Patient can return home with the following A little help with walking and/or  transfers   Equipment Recommendations  None recommended by PT    Recommendations for Other Services       Precautions / Restrictions Precautions Precautions: Fall Precaution Comments: monitor O2 Restrictions Weight Bearing Restrictions: No     Mobility  Bed Mobility Overal bed mobility: Needs Assistance Bed Mobility: Supine to Sit     Supine to sit: Supervision, HOB elevated     General bed mobility comments: supervision for lines    Transfers Overall transfer level: Needs assistance Equipment used: None Transfers: Sit to/from Stand Sit to Stand: Supervision           General transfer comment: guarding for lines    Ambulation/Gait Ambulation/Gait assistance: Supervision Gait Distance (Feet): 200 Feet Assistive device: Rolling walker (2 wheels), None Gait Pattern/deviations: Step-through pattern, Decreased stride length Gait velocity: decreased     General Gait Details: Utilized RW in hallway to assist with line management but pt taking a couple steps without AD.  Able to take side steps and backward steps.  See general comments for vitals   Stairs             Wheelchair Mobility    Modified Rankin (Stroke Patients Only)       Balance Overall balance assessment: Needs assistance Sitting-balance support: No upper extremity supported Sitting balance-Leahy Scale: Normal     Standing balance support: No upper extremity supported Standing balance-Leahy Scale: Fair  Cognition Arousal/Alertness: Awake/alert Behavior During Therapy: WFL for tasks assessed/performed Overall Cognitive Status: Within Functional Limits for tasks assessed                                          Exercises      General Comments General comments (skin integrity, edema, etc.): Pt reports has had O2 at home in past but did not use.  Would prefer not have at home.  He was 94% rest on 2 L.  Placed on RA and  sats 90%  on RA.  Ambulated on RA with sats dropping to 86% (did have pt stop and relax hand for good pleth), placed on 1 L and recovered within 1 min.  Completed ambulation on 1 L with sats 90%.  Left on 1 L at rest sitting sats 96%      Pertinent Vitals/Pain Pain Assessment Pain Assessment: No/denies pain    Home Living Family/patient expects to be discharged to:: Private residence Living Arrangements: Spouse/significant other;Children                      Prior Function            PT Goals (current goals can now be found in the care plan section) Progress towards PT goals: Progressing toward goals    Frequency    Min 3X/week      PT Plan Current plan remains appropriate    Co-evaluation              AM-PAC PT "6 Clicks" Mobility   Outcome Measure  Help needed turning from your back to your side while in a flat bed without using bedrails?: None Help needed moving from lying on your back to sitting on the side of a flat bed without using bedrails?: None Help needed moving to and from a bed to a chair (including a wheelchair)?: A Little Help needed standing up from a chair using your arms (e.g., wheelchair or bedside chair)?: A Little Help needed to walk in hospital room?: A Little Help needed climbing 3-5 steps with a railing? : A Little 6 Click Score: 20    End of Session Equipment Utilized During Treatment: Gait belt;Oxygen Activity Tolerance: Patient tolerated treatment well Patient left: in bed (sitting EOB with OT) Nurse Communication: Mobility status PT Visit Diagnosis: Other abnormalities of gait and mobility (R26.89);Difficulty in walking, not elsewhere classified (R26.2)     Time: 3428-7681 PT Time Calculation (min) (ACUTE ONLY): 14 min  Charges:  $Gait Training: 8-22 mins                     Abran Richard, PT Acute Rehab [facility] [place] 04/23/2022, 10:39 AM

## 2022-04-24 ENCOUNTER — Inpatient Hospital Stay (HOSPITAL_COMMUNITY): Payer: Medicare HMO

## 2022-04-24 LAB — MAGNESIUM: Magnesium: 1.8 mg/dL (ref 1.7–2.4)

## 2022-04-24 LAB — COMPREHENSIVE METABOLIC PANEL
ALT: 68 U/L — ABNORMAL HIGH (ref 0–44)
AST: 122 U/L — ABNORMAL HIGH (ref 15–41)
Albumin: 2.8 g/dL — ABNORMAL LOW (ref 3.5–5.0)
Alkaline Phosphatase: 121 U/L (ref 38–126)
Anion gap: 9 (ref 5–15)
BUN: 16 mg/dL (ref 8–23)
CO2: 23 mmol/L (ref 22–32)
Calcium: 8.7 mg/dL — ABNORMAL LOW (ref 8.9–10.3)
Chloride: 110 mmol/L (ref 98–111)
Creatinine, Ser: 0.68 mg/dL (ref 0.61–1.24)
GFR, Estimated: >60 mL/min (ref >60)
Glucose, Bld: 101 mg/dL — ABNORMAL HIGH (ref 70–99)
Potassium: 3.7 mmol/L (ref 3.5–5.1)
Sodium: 142 mmol/L (ref 135–145)
Total Bilirubin: 0.8 mg/dL (ref 0.3–1.2)
Total Protein: 5.9 g/dL — ABNORMAL LOW (ref 6.5–8.1)

## 2022-04-24 LAB — GLUCOSE, CAPILLARY
Glucose-Capillary: 101 mg/dL — ABNORMAL HIGH (ref 70–99)
Glucose-Capillary: 104 mg/dL — ABNORMAL HIGH (ref 70–99)
Glucose-Capillary: 113 mg/dL — ABNORMAL HIGH (ref 70–99)
Glucose-Capillary: 107 mg/dL — ABNORMAL HIGH (ref 70–99)

## 2022-04-24 LAB — PHOSPHORUS: Phosphorus: 3.2 mg/dL (ref 2.5–4.6)

## 2022-04-24 MED ORDER — ALBUTEROL SULFATE (2.5 MG/3ML) 0.083% IN NEBU
2.5000 mg | INHALATION_SOLUTION | Freq: Once | RESPIRATORY_TRACT | Status: AC
  Administered 2022-04-24: 2.5 mg via RESPIRATORY_TRACT
  Filled 2022-04-24: qty 3

## 2022-04-24 MED ORDER — ENOXAPARIN SODIUM 40 MG/0.4ML IJ SOSY: 40.0000 mg | PREFILLED_SYRINGE | INTRAMUSCULAR | Status: DC

## 2022-04-24 MED ORDER — FUROSEMIDE 10 MG/ML IJ SOLN
40.0000 mg | Freq: Once | INTRAMUSCULAR | Status: AC
  Administered 2022-04-24: 40 mg via INTRAVENOUS
  Filled 2022-04-24: qty 4

## 2022-04-24 NOTE — TOC Progression Note (Signed)
Transition of Care [facility]) - Progression Note    Patient Details  Name: [name] MRN: [id number] Date of Birth: 10-May-1946  Transition of Care [facility]) CM/SW Contact  Sharin Mons, RN Phone Number: 04/24/2022, 3:16 PM  Clinical Narrative:  Order noted for d/c. Pt now agreeable to d/cing to home with home oxygen device.   NCM spoke with pt @ bedside regarding home DME oxygen. Pt states will d/c to home with oxygen, and  requesting small portable tank also. Choice offered. Pt without provider preference. Referral made with Zack/ [employer] for home oxygen and for POC evaluation. [facility]. Resumption of home health services will begin (RN,PT,OT) once d/c, SOC within  48 hrs.  Pt states without Rx MED concerns.  Wife to provide transportation to home.  Expected Discharge Plan: [place] Barriers to Discharge: Continued Medical Work up  Expected Discharge Plan and Services Expected Discharge Plan: [place]   Discharge Planning Services: CM Consult     Expected Discharge Date: 04/24/22               DME Arranged: Oxygen DME Agency: [employer] Date DME Agency Contacted: 04/24/22 Time DME Agency Contacted: [phone] Representative spoke with at DME Agency: Zack             Social Determinants of Health ([place]) Interventions    Readmission Risk Interventions     No data to display

## 2022-04-24 NOTE — Progress Notes (Signed)
[facility] contacted regarding patient's expiratory wheezing and SOB with exertion. Patient denies difficulty breathing and chest pain. Patient on 2L oxygen [place]. Oxygen saturation is 90-92%. Will continue to monitor.

## 2022-04-24 NOTE — Progress Notes (Signed)
Patient NPO since MN. Tube feed stopped and disconnected at MN. Per GI, NPO and TF off for another attempt to complete an ERCP

## 2022-04-24 NOTE — Progress Notes (Signed)
   Patient Name: [name] Date of Encounter: 04/24/2022, 9:15 AM    Subjective  RN reported wheezing overnight - on 2 L [place] Mild abd pain   Objective  BP (!) 158/102 (BP Location: Right Arm)   Pulse 92   Temp (!) 97.5 F (36.4 C) (Axillary)   Resp 17   Ht '5\' 6"'$  (1.676 m)   Wt 82.5 kg   SpO2 94%   BMI 29.36 kg/m  NAD Abdominal breather Abd soft and minimally tender at incisions  Recent Labs  Lab 04/21/22 1712 04/22/22 0554 04/23/22 0741 04/24/22 0356  AST 106* 71* 265* 122*  ALT 35 29 84* 68*  ALKPHOS 63 61 130* 121  BILITOT 0.9 0.6 1.2 0.8  PROT 6.0* 6.0* 5.7* 5.9*  ALBUMIN 3.1* 3.1* 2.7* 2.8*       Assessment and Plan  Choledocholithiasis - do not think it is a clinical problem right now No sxs and LFT's going down so suspect that was related to lap chole itself Will plan to arrange an outpt ERCP  Gatha Mayer, MD, [facility] See Shea Evans on call - gastroenterology for best contact person 04/24/2022 9:15 AM

## 2022-04-24 NOTE — Care Management Important Message (Signed)
Important Message  Patient Details  Name: [name] MRN: [id number] Date of Birth: Feb 06, 1946   Medicare Important Message Given:  Yes     Hannah Beat 04/24/2022, 12:43 PM

## 2022-04-24 NOTE — Progress Notes (Signed)
Mobility Specialist Progress Note   04/24/22 1410  Mobility  Activity Ambulated with assistance in hallway  Level of Assistance Contact guard assist, steadying assist  Assistive Device Front wheel walker  Distance Ambulated (ft) 260 ft  Activity Response Tolerated well  $Mobility charge 1 Mobility   Pre Mobility: 101 HR, 94% SpO2 on 1LO2 During Mobility: 118 HR, 92% SpO2 on 2LO2 Post Mobility: 107 HR, 96% SpO2 on 1LO2  Received sitting EOB w/o complaint but having present sounds of wheezing w/ no expressed SOB. Pt floating at 87% SpO2 w/ 1LO2 while ambulating, temporarily requiring 2LO2(per advisement of RN) to maintain >92% SpO2. Returned back to EOB w/o fault but pt c/o R knee pain once seated. Placed back on 1LO2 in the room and left w/ social worker present   [place] Specialist Acute Rehab Office:  [phone]

## 2022-04-24 NOTE — Progress Notes (Signed)
GI specialist on call contacted regarding patient's worsening wheezing. Wheeze with expiration. Denies CP. Sob with exertion. O2 sat 90-91% on 2L.

## 2022-04-24 NOTE — Progress Notes (Signed)
Hospitalist contacted regarding patient's expiratory wheezing and SOB with exertion. Denies CP. Oxygen Sat 90-91% on 2L. Will continue to monitor.

## 2022-04-25 ENCOUNTER — Telehealth: Payer: Self-pay

## 2022-04-25 ENCOUNTER — Telehealth: Payer: Self-pay | Admitting: Cardiology

## 2022-04-25 NOTE — Telephone Encounter (Signed)
Patient states he has been off Coumadin due to recent procedure. He would like to know if he needs to keep 10/31 coumadin appointment or reschedule.

## 2022-04-25 NOTE — Patient Outreach (Signed)
  Care Coordination TOC Note Transition Care Management Follow-up Telephone Call Date of discharge and from where: 04/24/22-[place] Dx:s/p cholecystectomy How have you been since you were released from the hospital? Patient reported he is "doing great.: Denies any GI sxs or concerns at present. He has some soreness to surgical site but knows that it to be expected. Incision site looks good per pt report. He ate a good breakfast. States he has been walking around house. Any questions or concerns? Yes-Patient wants to know why he has to take Flomax and Robaxin. Discussed purpose of meds. Patient does not feel like he needs them and encouraged him to discuss this with MD. He voiced understanding.   Items Reviewed: Did the pt receive and understand the discharge instructions provided? Yes  Medications obtained and verified? Yes  Other? Yes  Any new allergies since your discharge? No  Dietary orders reviewed? Yes Do you have support at home? Yes   Home Care and Equipment/Supplies: Were home health services ordered? yes If so, what is the name of the agency? [facility]  Has the agency set up a time to come to the patient's home? No-patient had services prior to admission-he is aware to contact agency if they do not call him within 4hrs post discharge Were any new equipment or medical supplies ordered?  Yes: oxygen What is the name of the medical supply agency? [employer] Were you able to get the supplies/equipment? yes Do you have any questions related to the use of the equipment or supplies? No-States he has not had to use oxygen since returning home and tried to tell staff at hospital that he did not need it  Functional Questionnaire: (I = Independent and D = Dependent) ADLs: I  Bathing/Dressing- I  Meal Prep- I  Eating- I  Maintaining continence- I  Transferring/Ambulation- I  Managing Meds- I  Follow up appointments reviewed:  PCP Hospital f/u appt confirmed? Yes  Scheduled to  see Dr. Evette Doffing on 05/05/22 @ 9:15am. [facility] f/u appt confirmed?  Patient provided with surgeon contact info and will call office to schedule follow up appt . Are transportation arrangements needed? No -Patient denies any transportation issues-states family will take him If their condition worsens, is the pt aware to call PCP or go to the Emergency Dept.? Yes Was the patient provided with contact information for the PCP's office or ED? Yes Was to pt encouraged to call back with questions or concerns? Yes  SDOH assessments and interventions completed:   Yes  Care Coordination Interventions Activated:  Yes   Care Coordination Interventions:  Education provided    Encounter Outcome:  Pt. Visit Completed    Enzo Montgomery, RN,BSN,CCM [facility] Telephonic Care Management Coordinator Direct Phone: [phone] Toll Free: [phone] Fax: [phone]

## 2022-04-25 NOTE — Telephone Encounter (Signed)
Called and spoke to pt. Pt had question concerning his warfarin/lovenox management.   Pt stated he has not been taking his warfarin and is on lovenox and is wondering if he needs to come in on 04/29/2022 to have INR checked at the coumadin clinic.   Informed pt that he does not need to come to coumadin appointment on 04/29/2022 and cancelled appointment. Informed pt that he does not need to have his INR checked until his warfarin has been restarted and he has been on it for about 5 days. Informed him that lovenox would not have an affect on (increasing) his  INR.   Informed pt that lovenox keeps him protected from having a stroke or a blood clot once warfarin is resumed because it starts working once administered while warfarin is an anticoagulant that is slow acting and takes a while to build up in his system. That is why sometimes people will have to be on lovenox and warfarin after a procedure because while the warfarin is building up in the system lovenox is keeping the patient protected.   Advised if he was told to stay of the warfarin for an extended period of time that he will need to call his surgeon Dr. Bobbye Morton concerning his warfarin/lovenox.

## 2022-04-25 NOTE — Telephone Encounter (Signed)
Attempted to call pt back. Lmom to call clinic back.

## 2022-04-28 ENCOUNTER — Telehealth: Payer: Self-pay

## 2022-04-28 ENCOUNTER — Telehealth: Payer: Self-pay | Admitting: Student in an Organized Health Care Education/Training Program

## 2022-04-28 ENCOUNTER — Other Ambulatory Visit: Payer: Self-pay

## 2022-04-28 DIAGNOSIS — K831 Obstruction of bile duct: Secondary | ICD-10-CM

## 2022-04-28 DIAGNOSIS — K317 Polyp of stomach and duodenum: Secondary | ICD-10-CM

## 2022-04-28 NOTE — Telephone Encounter (Signed)
Agree  Thank you

## 2022-04-28 NOTE — Telephone Encounter (Signed)
The pt has been scheduled for ERCP EGD for 06/02/22 at 12:45 pm.  Left message on machine to call back

## 2022-04-28 NOTE — Patient Outreach (Signed)
  Care Coordination   Follow Up Visit Note   04/28/2022 Name: [name] MRN: [id number] DOB: 26-Apr-1946   Voicemail message received from patient. Return call paled to patient. Patient states he thinks he has it "straightened out but just wanted to make sure." He has called and cancelled Coumadin clinic appt schedule for tomorrow since he is not taking Coumadin at present-taking Lovenox. He made office aware med changed while in hospital. Advised patient that this was the correct thing to do. He will call clinic back and reschedule appt if and when Coumadin gets resumed by MD. He denies any other RN CM needs or concerns at this time.     Care Coordination Interventions Activated:  Yes  Care Coordination Interventions:  Yes, provided   Follow up plan: No further intervention required.   Encounter Outcome:  Pt. Visit Completed   Enzo Montgomery, RN,BSN,CCM [facility] Telephonic Care Management Coordinator Direct Phone: [phone] Toll Free: [phone] Fax: [phone]

## 2022-04-28 NOTE — Telephone Encounter (Signed)
[facility] requesting a Verbal Order for [place]  PT pt Resumption of care with Frequency of 1 time a week for 4 weeks.  Please call [facility] PT call back number [phone]

## 2022-04-28 NOTE — Telephone Encounter (Signed)
-----   Message from Irving Copas., MD sent at 04/27/2022  3:13 AM EDT ----- Regarding: RE: needs ERCP CG, Happy to give this a try.  Patient will need to be prepared that if he has difficulty being extubated as he did with this event, he will likely need to be hospitalized after.  Reading your note, as no pictures got placed in your note, makes this look like it will still be tough to get in, but another day and another try may be enough.  He needs to be aware that percutaneous biliary drainage may be required as well if unsuccessful.  Thanks. GM  Jania Steinke, Please offer next ERCP time slot with me and put a formal EGD on here as well so give me an extra 15 minutes to evaluate the upper GI tract. Thanks. GM  ----- Message ----- From: Gatha Mayer, MD Sent: 04/25/2022  10:08 AM EDT To: Timothy Lasso, RN; Irving Copas., MD Subject: needs ERCP                                     Gbe and Taysen Bushart,  This man has CBD stones - I failed at ERCP (periampullary tic) this week  He had lap chole and perc chole tube removed   He will need an outpatient ERCP attempt and he also has some small gastric polyps that would be reasonable to bx vs snare  He does take warfarin and we had a clearance to hold that should still be ok I think  Dr. Bobbye Morton is seeing him in a week and rechecking LFT's and keeping warfarin on hold for now but unless he flares up next available ERCP w/ GM should be fine  Patient aware we would be contacting him at some point  Thank you  Glendell Docker

## 2022-04-28 NOTE — Telephone Encounter (Signed)
Returned call to [place], [place] with [facility]. Requesting VO to continue HH PT 1 week 4 to work on balance, strengthening, and gait training. Verbal auth given. Will route to PCP for agreement/denial.

## 2022-04-29 ENCOUNTER — Other Ambulatory Visit: Payer: Self-pay | Admitting: Cardiology

## 2022-04-29 ENCOUNTER — Ambulatory Visit: Payer: Medicare HMO

## 2022-04-29 DIAGNOSIS — I10 Essential (primary) hypertension: Secondary | ICD-10-CM

## 2022-04-29 NOTE — Telephone Encounter (Signed)
ERCP scheduled, pt instructed and medications reviewed.  Patient instructions mailed to home and sent to My Chart.  Patient to call with any questions or concerns.  

## 2022-04-29 NOTE — Telephone Encounter (Signed)
Left message on machine to call back  

## 2022-04-30 ENCOUNTER — Other Ambulatory Visit: Payer: Self-pay | Admitting: *Deleted

## 2022-04-30 DIAGNOSIS — I10 Essential (primary) hypertension: Secondary | ICD-10-CM

## 2022-04-30 NOTE — Discharge Summary (Signed)
Patient ID: [name] [id number] 12-Sep-1945 76 y.o.  Admit date: 04/21/2022 Discharge date: 04/30/2022  Admitting Diagnosis: Choledocholithiasis   Discharge Diagnosis Patient Active Problem List   Diagnosis Date Noted   Status post cholecystectomy 04/21/2022   Choledocholithiasis 04/21/2022   Encounter for biliary drainage tube placement 01/11/2022   Extraperitoneal rupture of bladder 01/11/2022   Acquired thrombophilia ([place]) 04/22/2019   Encounter for therapeutic drug monitoring 03/28/2019   Atrial fibrillation ([place]) 03/04/2019   Flat foot, acquired, left 01/24/2019   Gout 01/24/2019   Prediabetes 01/24/2019   Osteoarthritis of both knees 12/12/2014   Tubular adenoma 10/07/2013   Seborrheic keratosis 10/07/2013   Healthcare maintenance 10/07/2013   Allergic rhinitis 08/08/2013   Essential hypertension 08/03/2013   Sciatica associated with disorder of lumbar spine 08/03/2013   Aortic stenosis 09/08/2011   Hyperlipidemia 10/02/2009   CAD (coronary artery disease) 10/02/2009    Consultants GI  Reason for Admission: choledocholithiasis  Procedures ERCP, lap chole with [place] Hospital Course:  86M s/p lap chole and ERCP. He stayed intubated for 24h post-op for pulmonary reasons. Unfortunately, despite ERCP and IOC, neither I nor GI was able to clear the stones in his CBD. Plan is for repeat CMP/Tbili in one week and outpatient decision-making by GI regarding repeat ERCP if the stone remain non-obstructing. He would be high risk for repeat ERCP due to his pulmonary status and his anatomy is complex, so anticipating a difficult ERCP. He is being sent with a course of therapeutic lovenox for a week until his labs come back and then plan to restart coumadin if labs remain stable.   His sats dropped with PT and his O2 requirement is increased even at rest. He is insistent on discharge. He is very wheezy, although he states this is his baseline (it is not). He was  previously refusing home O2, but I have convinced him to go home with O2 on the day of discharge. He's gotten some albuterol nebs and a dose of lasix before discharge. Incidentally, he had to have his urethral meatus dilated by Urology post-op and his Foley was d/c'd with spontaneous void before discharge.  Physical Exam: Gen: comfortable, no distress Neuro: non-focal exam HEENT: PERRL Neck: supple CV: RRR Pulm: unlabored breathing Abd: soft, NT GU: clear yellow urine Extr: wwp, no edema   Allergies as of 04/24/2022   No Known Allergies      Medication List     STOP taking these medications    BD PosiFlush 0.9 % Soln injection Generic drug: sodium chloride flush   warfarin 5 MG tablet Commonly known as: COUMADIN       TAKE these medications    Acetaminophen Extra Strength 500 MG Tabs Take 2 tablets (1,000 mg total) by mouth 4 (four) times daily. What changed: when to take this   atorvastatin 40 MG tablet Commonly known as: LIPITOR Take 1 tablet (40 mg total) by mouth daily.   carvedilol 6.25 MG tablet Commonly known as: COREG Take 1 tablet (6.25 mg total) by mouth 2 (two) times daily with a meal.   docusate sodium 100 MG capsule Commonly known as: COLACE Take 1 capsule (100 mg total) by mouth 2 (two) times daily. What changed: when to take this   enoxaparin 80 MG/0.8ML injection Commonly known as: LOVENOX Inject 0.8 mLs (80 mg total) into the skin every 12 (twelve) hours for 28 doses.   feeding supplement Liqd Take 237 mLs by mouth 3 (three) times daily  between meals.   finasteride 5 MG tablet Commonly known as: PROSCAR Take 1 tablet (5 mg total) by mouth daily.   ibuprofen 600 MG tablet Commonly known as: ADVIL Take 1 tablet (600 mg total) by mouth 4 (four) times daily.   loratadine 10 MG tablet Commonly known as: CLARITIN Take 10 mg by mouth daily as needed (wheezing).   methocarbamol 750 MG tablet Commonly known as: ROBAXIN Take 1 tablet  (750 mg total) by mouth 4 (four) times daily.   PRIMATENE PO Take 1 tablet by mouth daily as needed (wheezing).   silodosin 8 MG Caps capsule Commonly known as: RAPAFLO Take 1 capsule (8 mg total) by mouth daily with breakfast.   tamsulosin 0.4 MG Caps capsule Commonly known as: FLOMAX Take 1 capsule (0.4 mg total) by mouth daily.          Follow-up Information     Axel Filler, MD Follow up.   Specialty: Internal Medicine Contact information: [place] [address] [phone]         [facility] Follow up.   Specialty: [place] Why: [facility] will resume home health services within 48 hours post discharge Contact information: [address] [place] [address] [phone]         [facility] Follow up.   Why: Home oxygen device is provided by [employer]. Questions please call [phone]. Contact information: [address] [place] [address] [phone]                  Signed: Jesusita Oka, [facility] 04/30/2022, 9:04 PM

## 2022-05-05 ENCOUNTER — Ambulatory Visit (INDEPENDENT_AMBULATORY_CARE_PROVIDER_SITE_OTHER): Payer: Medicare HMO | Admitting: Student in an Organized Health Care Education/Training Program

## 2022-05-05 ENCOUNTER — Encounter: Payer: Self-pay | Admitting: Student in an Organized Health Care Education/Training Program

## 2022-05-05 VITALS — BP 160/75 | HR 67 | Temp 97.5°F | Ht 66.0 in | Wt 171.6 lb

## 2022-05-05 DIAGNOSIS — I4891 Unspecified atrial fibrillation: Secondary | ICD-10-CM | POA: Diagnosis not present

## 2022-05-05 DIAGNOSIS — I10 Essential (primary) hypertension: Secondary | ICD-10-CM | POA: Diagnosis not present

## 2022-05-05 DIAGNOSIS — Z23 Encounter for immunization: Secondary | ICD-10-CM

## 2022-05-05 DIAGNOSIS — Z87891 Personal history of nicotine dependence: Secondary | ICD-10-CM | POA: Diagnosis not present

## 2022-05-05 DIAGNOSIS — K8050 Calculus of bile duct without cholangitis or cholecystitis without obstruction: Secondary | ICD-10-CM | POA: Diagnosis not present

## 2022-05-05 DIAGNOSIS — J439 Emphysema, unspecified: Secondary | ICD-10-CM | POA: Insufficient documentation

## 2022-05-05 DIAGNOSIS — N3289 Other specified disorders of bladder: Secondary | ICD-10-CM

## 2022-05-05 NOTE — Assessment & Plan Note (Signed)
Patient with a history of emphysema due to prior tobacco use.  Last pulmonary function testing showed minimal airway obstruction and no significant response to albuterol, but it did show air trapping and reduced diffusion capacity.  He has had issues with hypoxia following hospitalizations, wore supplemental oxygen for a period but dislikes using it at home.  I offered him supplemental oxygen again today but he declined.  He understands the risks of transient hypoxemia.  Does not like having the machines or the tubings in his house.  Supplemental oxygen was arranged after last discharge but he turned away when it was delivered.  No wheezing on exam today, I did offer him LAMA inhaler but he declined this as well, said it has not helped in the past.  Overall I think most of his dyspnea is from deconditioning with underlying structural lung disease.  Will take a while to improve conditioning.  He is working with PT at home.  Current functional status is acceptable to him.

## 2022-05-05 NOTE — Assessment & Plan Note (Signed)
Stable heart rate today.  He has valvular A-fib and is anticoagulated with Coumadin for primary stroke prevention.  Was on Lovenox for a period postoperatively but, discontinued this and now transition back to Coumadin.  He does home INR checks and dose is managed by the Coumadin clinic with cardiology.

## 2022-05-05 NOTE — Assessment & Plan Note (Addendum)
Surgical scar is healing well over the bladder.  He had urethral dilation in late October and Foley catheter removed.  He reports emptying his bladder easily.  Seems to be doing well from this issue.  We will continue with finasteride and silodosin.

## 2022-05-05 NOTE — Assessment & Plan Note (Signed)
Blood pressure a little elevated today.  We will continue with losartan 50 mg daily and carvedilol 6.25 mg twice daily.  These are the doses that he used prior to recent acute illness.  We will see how he does with next procedure in early December, if blood pressure is still elevated after that then may add on another agent.

## 2022-05-05 NOTE — Progress Notes (Signed)
Established Patient Office Visit  Subjective   Patient ID: [name], male    DOB: [birth year]  Age: 76 y.o. MRN: [id number]  Chief Complaint  Patient presents with   Follow-up    HPI  76 year old person here for follow-up after recent hospitalization for laparoscopic cholecystectomy.  During the procedure he was unable to have full clearance of the common bile duct with ERCP.  He was hospitalized for a few days, discharged back to home with a few outstanding issues.  Continue to have significant dyspnea with minimal exertion, declined using supplemental oxygen since being at home.  Currently is able to walk around the house with the use of a cane.  Says he can go to the end of the driveway but has to walk slowly back.  Uses a wheelchair for any activity outside the house.  No cough, no chest pressure or pain, no fevers or chills.  Denies any abdominal pain, had some soreness at the incision sites.  Denies any postprandial pain.  Urinating normally with no dysuria.  Eating and drinking well.  Working with PT at home and reports making some improvements.  Reports good adherence with his medications.    Objective:     BP (!) 160/75 (BP Location: Right Arm, Patient Position: Sitting, Cuff Size: Small)   Pulse 67   Temp (!) 97.5 F (36.4 C) (Oral)   Ht '5\' 6"'$  (1.676 m)   Wt 171 lb 9.6 oz (77.8 kg)   SpO2 97%   BMI 27.70 kg/m    Physical Exam  General: Chronically ill-appearing man, no distress gets up to the exam table easily CV: Irregular rate, 3 out of 6 early systolic murmur Lungs: Clear throughout with no wheezing Abdomen: Well-healing laparoscopic incisions, well-healing suprapubic incision, no tenderness to palpation, normal bowel sounds Extremities: Sarcopenia, no lower extremity edema Skin: Diffuse senile purpura on his arms Neuro: Alert, conversational, full strength throughout, wide-based gait    Assessment & Plan:   Problem List Items Addressed This Visit        High   Essential hypertension - Primary (Chronic)    Blood pressure a little elevated today.  We will continue with losartan 50 mg daily and carvedilol 6.25 mg twice daily.  These are the doses that he used prior to recent acute illness.  We will see how he does with next procedure in early December, if blood pressure is still elevated after that then may add on another agent.      Relevant Medications   warfarin (COUMADIN) 5 MG tablet   Atrial fibrillation (H[facility]) (Chronic)    Stable heart rate today.  He has valvular A-fib and is anticoagulated with Coumadin for primary stroke prevention.  Was on Lovenox for a period postoperatively but, discontinued this and now transition back to Coumadin.  He does home INR checks and dose is managed by the Coumadin clinic with cardiology.      Relevant Medications   warfarin (COUMADIN) 5 MG tablet   Emphysema of lung ([place])    Patient with a history of emphysema due to prior tobacco use.  Last pulmonary function testing showed minimal airway obstruction and no significant response to albuterol, but it did show air trapping and reduced diffusion capacity.  He has had issues with hypoxia following hospitalizations, wore supplemental oxygen for a period but dislikes using it at home.  I offered him supplemental oxygen again today but he declined.  He understands the risks of transient hypoxemia.  Does not like having the machines or the tubings in his house.  Supplemental oxygen was arranged after last discharge but he turned away when it was delivered.  No wheezing on exam today, I did offer him LAMA inhaler but he declined this as well, said it has not helped in the past.  Overall I think most of his dyspnea is from deconditioning with underlying structural lung disease.  Will take a while to improve conditioning.  He is working with PT at home.  Current functional status is acceptable to him.        Unprioritized   Extraperitoneal rupture of bladder     Surgical scar is healing well over the bladder.  He had urethral dilation in late October and Foley catheter removed.  He reports emptying his bladder easily.  Seems to be doing well from this issue.  We will continue with finasteride and silodosin.       Choledocholithiasis    Complicated history of cholecystitis resulting in prolonged hospitalization in July followed by percutaneous cholecystostomy.  He completed cholecystectomy 10/23 but unfortunately was unable to have successful ERCP removal of common bile duct stones due to complex anatomy.  After discharge she has done well, regaining strength, acceptable functional status at home.  Not having any biliary colic symptoms, eating and drinking well.  Abdominal exam today is reassuring, surgical scars are healing well.  Will check CBC, CMP and total bilirubin today.  He has a follow-up ERCP planned for December 4 with Dr. Rush Landmark to try to remove the biliary stone.      Relevant Orders   CMP14 + Anion Gap   CBC no Diff    Return in about 6 weeks (around 06/16/2022).    Axel Filler, MD

## 2022-05-05 NOTE — Assessment & Plan Note (Signed)
Complicated history of cholecystitis resulting in prolonged hospitalization in July followed by percutaneous cholecystostomy.  He completed cholecystectomy 10/23 but unfortunately was unable to have successful ERCP removal of common bile duct stones due to complex anatomy.  After discharge she has done well, regaining strength, acceptable functional status at home.  Not having any biliary colic symptoms, eating and drinking well.  Abdominal exam today is reassuring, surgical scars are healing well.  Will check CBC, CMP and total bilirubin today.  He has a follow-up ERCP planned for December 4 with Dr. Rush Landmark to try to remove the biliary stone.

## 2022-05-06 ENCOUNTER — Ambulatory Visit: Payer: Medicare HMO | Attending: Cardiology

## 2022-05-06 DIAGNOSIS — Z5181 Encounter for therapeutic drug level monitoring: Secondary | ICD-10-CM | POA: Diagnosis not present

## 2022-05-06 DIAGNOSIS — I4819 Other persistent atrial fibrillation: Secondary | ICD-10-CM

## 2022-05-06 DIAGNOSIS — I10 Essential (primary) hypertension: Secondary | ICD-10-CM | POA: Diagnosis not present

## 2022-05-06 LAB — CMP14 + ANION GAP
ALT: 12 IU/L (ref 0–44)
AST: 19 IU/L (ref 0–40)
Albumin/Globulin Ratio: 1.4 (ref 1.2–2.2)
Albumin: 3.8 g/dL (ref 3.8–4.8)
Alkaline Phosphatase: 102 IU/L (ref 44–121)
Anion Gap: 14 mmol/L (ref 10.0–18.0)
BUN/Creatinine Ratio: 14 (ref 10–24)
BUN: 12 mg/dL (ref 8–27)
Bilirubin Total: 0.3 mg/dL (ref 0.0–1.2)
CO2: 23 mmol/L (ref 20–29)
Calcium: 9.2 mg/dL (ref 8.6–10.2)
Chloride: 108 mmol/L — ABNORMAL HIGH (ref 96–106)
Creatinine, Ser: 0.88 mg/dL (ref 0.76–1.27)
Globulin, Total: 2.7 g/dL (ref 1.5–4.5)
Glucose: 88 mg/dL (ref 70–99)
Potassium: 4.1 mmol/L (ref 3.5–5.2)
Sodium: 145 mmol/L — ABNORMAL HIGH (ref 134–144)
Total Protein: 6.5 g/dL (ref 6.0–8.5)
eGFR: 90 mL/min/{1.73_m2} (ref >59)

## 2022-05-06 LAB — CBC
Hematocrit: 34.4 % — ABNORMAL LOW (ref 37.5–51.0)
Hemoglobin: 10.9 g/dL — ABNORMAL LOW (ref 13.0–17.7)
MCH: 29.8 pg (ref 26.6–33.0)
MCHC: 31.7 g/dL (ref 31.5–35.7)
MCV: 94 fL (ref 79–97)
Platelets: 272 10*3/uL (ref 150–450)
RBC: 3.66 x10E6/uL — ABNORMAL LOW (ref 4.14–5.80)
RDW: 13.6 % (ref 11.6–15.4)
WBC: 7.9 10*3/uL (ref 3.4–10.8)

## 2022-05-06 LAB — POCT INR: INR: 1.7 — AB (ref 2.0–3.0)

## 2022-05-06 NOTE — Patient Instructions (Addendum)
TAKE 1 TABLET TODAY AND WEDNESDAY then Continue taking warfarin 1/2 tablet (2.'5mg'$ ) daily. Remain consistent with leafy veggies. Recheck INR in 2 weeks. Coumadin Clinic [phone]. ERCP 12/4 HOLDING COUMADIN 11/29-12/3

## 2022-05-07 ENCOUNTER — Telehealth: Payer: Self-pay

## 2022-05-07 LAB — BASIC METABOLIC PANEL
BUN/Creatinine Ratio: 14 (ref 10–24)
BUN: 11 mg/dL (ref 8–27)
CO2: 29 mmol/L (ref 20–29)
Calcium: 8.9 mg/dL (ref 8.6–10.2)
Chloride: 106 mmol/L (ref 96–106)
Creatinine, Ser: 0.78 mg/dL (ref 0.76–1.27)
Glucose: 90 mg/dL (ref 70–99)
Potassium: 3.7 mmol/L (ref 3.5–5.2)
Sodium: 146 mmol/L — ABNORMAL HIGH (ref 134–144)
eGFR: 94 mL/min/{1.73_m2} (ref >59)

## 2022-05-07 NOTE — Telephone Encounter (Signed)
-----   Message from Timothy Lasso, RN sent at 04/25/2022 10:33 AM EDT ----- Regarding: FW: needs ERCP  ----- Message ----- From: Gatha Mayer, MD Sent: 04/25/2022  10:08 AM EDT To: Timothy Lasso, RN; Irving Copas., MD Subject: needs ERCP                                     Gbe and Joshu Furukawa,  This man has CBD stones - I failed at ERCP (periampullary tic) this week  He had lap chole and perc chole tube removed   He will need an outpatient ERCP attempt and he also has some small gastric polyps that would be reasonable to bx vs snare  He does take warfarin and we had a clearance to hold that should still be ok I think  Dr. Bobbye Morton is seeing him in a week and rechecking LFT's and keeping warfarin on hold for now but unless he flares up next available ERCP w/ GM should be fine  Patient aware we would be contacting him at some point  Thank you  Glendell Docker

## 2022-05-07 NOTE — Telephone Encounter (Signed)
The pt has been scheduled and instructed for 12/4

## 2022-05-09 ENCOUNTER — Encounter: Payer: Self-pay | Admitting: *Deleted

## 2022-05-19 ENCOUNTER — Telehealth: Payer: Self-pay | Admitting: *Deleted

## 2022-05-19 ENCOUNTER — Ambulatory Visit: Payer: Medicare HMO | Attending: Cardiology | Admitting: *Deleted

## 2022-05-19 DIAGNOSIS — Z5181 Encounter for therapeutic drug level monitoring: Secondary | ICD-10-CM

## 2022-05-19 DIAGNOSIS — I4819 Other persistent atrial fibrillation: Secondary | ICD-10-CM | POA: Diagnosis not present

## 2022-05-19 LAB — POCT INR: INR: 2.3 (ref 2.0–3.0)

## 2022-05-19 NOTE — Telephone Encounter (Signed)
Pt agreeable to plan of care for tele pre op appt 05/27/22 @ 9 am. Med rec and consent are done.

## 2022-05-19 NOTE — Telephone Encounter (Signed)
   Name: [name]  DOB: Jul 27, 1945  MRN: [id number]  Primary Cardiologist: Kirk Ruths, MD   Preoperative team, please contact this patient and set up a phone call appointment for further preoperative risk assessment. Please obtain consent and complete medication review. Thank you for your help.  I confirm that guidance regarding antiplatelet and oral anticoagulation therapy has been completed and, if necessary, noted below.  Pharmacy has provided recommendations for holding anticoagulation.   Deberah Pelton, NP 05/19/2022, 1:21 PM [place]

## 2022-05-19 NOTE — Telephone Encounter (Signed)
Please advise on holding Coumadin prior to ERCP.  Procedure scheduled for 06/02/2022.  Thank you for your help.  Jossie Ng. Natalea Sutliff NP-C     05/19/2022, 11:42 AM [facility] [place] [address] Office [phone] Fax [phone]

## 2022-05-19 NOTE — Telephone Encounter (Signed)
Patient was seen in Coumadin Clinic this morning.  Clinic aware or procedure and appropriate dosing information was given

## 2022-05-19 NOTE — Telephone Encounter (Signed)
Pt agreeable to plan of care for tele pre op appt 05/27/22 @ 9 am. Med rec and consent are done.     Patient Consent for Virtual Visit        [name] has provided verbal consent on 05/19/2022 for a virtual visit (video or telephone).   CONSENT FOR VIRTUAL VISIT FOR:  [name]  By participating in this virtual visit I agree to the following:  I hereby voluntarily request, consent and authorize [facility] and its employed or contracted physicians, physician assistants, nurse practitioners or other licensed health care professionals (the Practitioner), to provide me with telemedicine health care services (the "Services") as deemed necessary by the treating Practitioner. I acknowledge and consent to receive the Services by the Practitioner via telemedicine. I understand that the telemedicine visit will involve communicating with the Practitioner through live audiovisual communication technology and the disclosure of certain medical information by electronic transmission. I acknowledge that I have been given the opportunity to request an in-person assessment or other available alternative prior to the telemedicine visit and am voluntarily participating in the telemedicine visit.  I understand that I have the right to withhold or withdraw my consent to the use of telemedicine in the course of my care at any time, without affecting my right to future care or treatment, and that the Practitioner or I may terminate the telemedicine visit at any time. I understand that I have the right to inspect all information obtained and/or recorded in the course of the telemedicine visit and may receive copies of available information for a reasonable fee.  I understand that some of the potential risks of receiving the Services via telemedicine include:  Delay or interruption in medical evaluation due to technological equipment failure or disruption; Information transmitted may not be  sufficient (e.g. poor resolution of images) to allow for appropriate medical decision making by the Practitioner; and/or  In rare instances, security protocols could fail, causing a breach of personal health information.  Furthermore, I acknowledge that it is my responsibility to provide information about my medical history, conditions and care that is complete and accurate to the best of my ability. I acknowledge that Practitioner's advice, recommendations, and/or decision may be based on factors not within their control, such as incomplete or inaccurate data provided by me or distortions of diagnostic images or specimens that may result from electronic transmissions. I understand that the practice of medicine is not an exact science and that Practitioner makes no warranties or guarantees regarding treatment outcomes. I acknowledge that a copy of this consent can be made available to me via my patient portal ([facility]), or I can request a printed copy by calling the office of [facility].    I understand that my insurance will be billed for this visit.   I have read or had this consent read to me. I understand the contents of this consent, which adequately explains the benefits and risks of the Services being provided via telemedicine.  I have been provided ample opportunity to ask questions regarding this consent and the Services and have had my questions answered to my satisfaction. I give my informed consent for the services to be provided through the use of telemedicine in my medical care

## 2022-05-19 NOTE — Patient Instructions (Addendum)
Description   Continue taking warfarin 1/2 tablet (2.'5mg'$ ) daily. Remain consistent with leafy veggies. Recheck INR in 1 week post procedure. [facility] [phone]. ERCP 12/4 HOLDING COUMADIN 11/29-12/3    Last dose 11/28 for upcoming procedure. After procedure take an extra 1/2 tablet for 2 days then resume normal dose.

## 2022-05-19 NOTE — Telephone Encounter (Signed)
Patient with diagnosis of atrial fibrillation on warfarin for anticoagulation.    Procedure:   ERCP   Date of Surgery:  Clearance 06/02/22       CHA2DS2-VASc Score = 4   This indicates a 4.8% annual risk of stroke. The patient's score is based upon: CHF History: 1 HTN History: 1 Diabetes History: 0 Stroke History: 0 Vascular Disease History: 1 Age Score: 1 Gender Score: 0    CrCl 91 Platelet count 272  Per office protocol, patient can hold warfarin for 5 days prior to procedure.   Patient will not need bridging with Lovenox (enoxaparin) around procedure.  **This guidance is not considered finalized until pre-operative APP has relayed final recommendations.**

## 2022-05-19 NOTE — Telephone Encounter (Signed)
   Pre-operative Risk Assessment    Patient Name: [name]  DOB: 05-05-1946 MRN: [id number]      Request for Surgical Clearance    Procedure:   ERCP  Date of Surgery:  Clearance 06/02/22                                 Surgeon:  DR. MANSOURATY Surgeon's Group or Practice Name:  [facility] Phone number:  [phone] Fax number:  [phone]   Type of Clearance Requested:   - Medical  - Pharmacy:  Hold Warfarin (Coumadin)     Type of Anesthesia:  MAC   Additional requests/questions:    [name]   05/19/2022, 11:30 AM

## 2022-05-26 ENCOUNTER — Encounter (HOSPITAL_COMMUNITY): Payer: Self-pay | Admitting: Gastroenterology

## 2022-05-27 ENCOUNTER — Ambulatory Visit: Payer: Medicare HMO | Attending: Cardiology | Admitting: Physician Assistant

## 2022-05-27 DIAGNOSIS — Z01810 Encounter for preprocedural cardiovascular examination: Secondary | ICD-10-CM

## 2022-05-27 NOTE — Progress Notes (Signed)
Virtual Visit via Telephone Note   Because of [name] co-morbid illnesses, he is at least at moderate risk for complications without adequate follow up.  This format is felt to be most appropriate for this patient at this time.  The patient did not have access to video technology/had technical difficulties with video requiring transitioning to audio format only (telephone).  All issues noted in this document were discussed and addressed.  No physical exam could be performed with this format.  Please refer to the patient's chart for his consent to telehealth for [facility].  Evaluation Performed:  Preoperative cardiovascular risk assessment _____________   Date:  05/27/2022   Patient ID:  [name], DOB 09/26/1945, MRN [id number] Patient Location:  Home Provider location:   Office  Primary Care Provider:  Axel Filler, MD Primary Cardiologist:  Kirk Ruths, MD  Chief Complaint / Patient Profile   76 y.o. y/o male with a h/o nonobstructive CAD, CHF, PAF on coumadin, and AS who is pending ERCP and presents today for telephonic preoperative cardiovascular risk assessment.  Past Medical History    Past Medical History:  Diagnosis Date   Allergic rhinitis 08/08/2013   takes Loratadine daily    Aortic stenosis 09/08/2011   With mild aortic regurgitation, mean gradient 13 mmHg    Cataract    right but immature   CHF (congestive heart failure) ([place]) 3149   Complication of anesthesia    stopped breathing - during shoulder surgery 2009-2010   Coronary artery disease 10/02/2009   Cardiac cath (October 2007): 20% left main, 60% mid LAD, 60-70% distal LAD, 30-40% first diagonal, 30% circumflex, 40% OM, 30-40% RCA    Degenerative joint disease of shoulder 08/03/2013   Bilateral, s/p right total shoulder arthroplasty 05/29/2011 and left shoulder arthroplasty 70/26/3785   Diastolic dysfunction 88/50/2774   Echo (04/04/2016): Grade I    Diverticulosis 10/07/2013   Seen on colonoscopy in 2007    Erectile dysfunction 05/07/2009   Essential hypertension 08/03/2013   had been on Lisinopril but stopped per MD   First degree AV block 03/14/2016   Gastric ulcer 10/07/2013   Seen on EGD 04/10/2006    Hemorrhoids 10/07/2013   Hyperlipidemia 10/02/2009   Paroxysmal atrial fibrillation ([place]) 07/14/2006   One episode, provoked by alcohol use disorder, briefly anticoagulated with warfarin, then switched to aspirin alone   Sciatica associated with disorder of lumbar spine 08/03/2013   Anterolisthesis with right L 4-5 nerve root compression.  Treated with epidural injections and Physical Therapy.    Seborrheic keratosis 10/07/2013   Tubular adenoma of colon    Past Surgical History:  Procedure Laterality Date   CARDIAC CATHETERIZATION  2007   CARDIOVERSION     CARDIOVERSION N/A 08/04/2019   Procedure: CARDIOVERSION;  Surgeon: Fay Records, MD;  Location: [facility] ENDOSCOPY;  Service: Cardiovascular;  Laterality: N/A;   CHOLECYSTECTOMY N/A 04/21/2022   Procedure: LAPAROSCOPIC CHOLECYSTECTOMY;  Surgeon: Jesusita Oka, MD;  Location: [facility];  Service: General;  Laterality: N/A;   COLONOSCOPY     CYSTOSCOPY N/A 01/04/2022   Procedure: Consuela Mimes;  Surgeon: Raynelle Bring, MD;  Location: [facility];  Service: Urology;  Laterality: N/A;   ERCP N/A 04/21/2022   Procedure: ATTEMPTED ENDOSCOPIC RETROGRADE CHOLANGIOPANCREATOGRAPHY (ERCP);  Surgeon: Gatha Mayer, MD;  Location: [facility];  Service: Gastroenterology;  Laterality: N/A;   EYE SURGERY Bilateral 2022   bilateral cataracts   INSERTION OF MESH N/A 03/11/2019   Procedure: Insertion Of  Mesh;  Surgeon: Ralene Ok, MD;  Location: [facility];  Service: General;  Laterality: N/A;   INTRAOPERATIVE CHOLANGIOGRAM N/A 04/21/2022   Procedure: INTRAOPERATIVE CHOLANGIOGRAM;  Surgeon: Jesusita Oka, MD;  Location: [facility];  Service: General;  Laterality: N/A;   IR EXCHANGE BILIARY DRAIN  02/14/2022    IR PERC CHOLECYSTOSTOMY  12/29/2021   JOINT REPLACEMENT     KNEE ARTHROSCOPY WITH MENISCAL REPAIR Right 01/30/2011   LAPAROTOMY N/A 01/04/2022   Procedure: EXPLORATORY LAPAROTOMY WITH EXTRAPERITONEAL BLADDER REPAIR;  Surgeon: Raynelle Bring, MD;  Location: [facility];  Service: Urology;  Laterality: N/A;   right finger surgery     as a child   TONSILLECTOMY     TOTAL KNEE ARTHROPLASTY Left 07/28/2017   Procedure: LEFT TOTAL KNEE ARTHROPLASTY;  Surgeon: Renette Butters, MD;  Location: [facility];  Service: Orthopedics;  Laterality: Left;   TOTAL SHOULDER ARTHROPLASTY  05/29/2011   Procedure: TOTAL SHOULDER ARTHROPLASTY;  Surgeon: Metta Clines Supple;  Location: [facility];  Service: Orthopedics;  Laterality: Right;   TOTAL SHOULDER ARTHROPLASTY Left 06/08/2014   DR SUPPLE   TOTAL SHOULDER ARTHROPLASTY Left 06/08/2014   Procedure: LEFT TOTAL SHOULDER ARTHROPLASTY;  Surgeon: Marin Shutter, MD;  Location: [facility];  Service: Orthopedics;  Laterality: Left;   UMBILICAL HERNIA REPAIR N/A 03/11/2019   Procedure: LAPAROSCOPIC UMBILICAL HERNIA;  Surgeon: Ralene Ok, MD;  Location: [facility];  Service: General;  Laterality: N/A;    Allergies  No Known Allergies  History of Present Illness    [name] is a 76 y.o. male who presents via audio/video conferencing for a telehealth visit today.  Pt was last seen in cardiology clinic on 03/13/22 by Dr. Stanford Breed.  At that time KORVER GRAYBEAL was cleared to proceed with laparoscopic cholecystectomy.  The patient is now pending procedure as outlined above. Since his last visit, he underwent lap chole with prolonged hospitalization. He is now pending ERCP. He is recovering from hospitalization with stable SOB. He denies angina and symptoms of CHF exacerbation.  Home Medications    Prior to Admission medications   Medication Sig Start Date End Date Taking? Authorizing Provider  acetaminophen (TYLENOL) 500 MG tablet Take 2 tablets (1,000 mg total) by mouth 4  (four) times daily. 04/23/22   Jesusita Oka, MD  allopurinol (ZYLOPRIM) 100 MG tablet Take 200 mg by mouth daily. 04/30/22   [provider]  atorvastatin (LIPITOR) 40 MG tablet Take 1 tablet (40 mg total) by mouth daily. 01/20/22   Lacinda Axon, MD  carvedilol (COREG) 6.25 MG tablet Take 1 tablet (6.25 mg total) by mouth 2 (two) times daily with a meal. 01/20/22   Amponsah, Charisse March, MD  docusate sodium (COLACE) 100 MG capsule Take 1 capsule (100 mg total) by mouth 2 (two) times daily. 04/23/22   Jesusita Oka, MD  ePHEDrine HCl (PRIMATENE PO) Take 1 tablet by mouth daily as needed (wheezing).    [provider]  finasteride (PROSCAR) 5 MG tablet Take 1 tablet (5 mg total) by mouth daily. 01/21/22   Lacinda Axon, MD  ibuprofen (ADVIL) 600 MG tablet Take 1 tablet (600 mg total) by mouth 4 (four) times daily. 04/23/22   Jesusita Oka, MD  loratadine (CLARITIN) 10 MG tablet Take 10 mg by mouth daily as needed (wheezing).    [provider]  losartan (COZAAR) 50 MG tablet TAKE 1 TABLET EVERY DAY 04/30/22   Crenshaw, Denice Bors, MD  methocarbamol (ROBAXIN)  750 MG tablet Take 1 tablet (750 mg total) by mouth 4 (four) times daily. 04/23/22   Jesusita Oka, MD  silodosin (RAPAFLO) 8 MG CAPS capsule Take 1 capsule (8 mg total) by mouth daily with breakfast. 02/27/22   Axel Filler, MD  warfarin (COUMADIN) 5 MG tablet Take by mouth. 04/30/22   [provider]    Physical Exam    Vital Signs:  ZACARI STIFF does not have vital signs available for review today.  Given telephonic nature of communication, physical exam is limited. AAOx3. NAD. Normal affect.  Speech and respirations are unlabored.  Accessory Clinical Findings    None  Assessment & Plan    1.  Preoperative Cardiovascular Risk Assessment:  He has a history of nonobstructive CAD and CHF. He does not require insulin and creatinine below 2. According to the RCRI, he has a  6.6% risk of MACE. Prior to gallbladder issues, he was exercising at the gym without angina. He was recommended to proceed with lap chole at his 02/2022 appointment with Dr. Stanford Breed. There has been no interval change in his symptoms or cardiac history since that time. He reports symptoms, ongoing SOB, more consistent with deconditioning from prolonged hospitalization. AS stable on last echo.  He denies symptoms of volume overload.  Therefore, based on ACC/AHA guidelines, the patient would be at acceptable risk for the planned procedure without further cardiovascular testing. He understands his risk and agrees to proceed.  The patient was advised that if he develops new symptoms prior to surgery to contact our office to arrange for a follow-up visit, and he verbalized understanding.    2. Chronic coumadin therapy  Procedure:   ERCP Date of Surgery:  Clearance 06/02/22     CHA2DS2-VASc Score = 4   This indicates a 4.8% annual risk of stroke. The patient's score is based upon: CHF History: 1 HTN History: 1 Diabetes History: 0 Stroke History: 0 Vascular Disease History: 1 Age Score: 1 Gender Score: 0    CrCl 91 Platelet count 272   Per office protocol, patient can hold warfarin for 5 days prior to procedure.   Patient will not need bridging with Lovenox (enoxaparin) around procedure.    A copy of this note will be routed to requesting surgeon.  Time:   Today, I have spent 14 minutes with the patient with telehealth technology discussing medical history, symptoms, and management plan.     Tami Lin Versia Mignogna, PA  05/27/2022, 9:55 AM

## 2022-06-02 ENCOUNTER — Ambulatory Visit (HOSPITAL_BASED_OUTPATIENT_CLINIC_OR_DEPARTMENT_OTHER): Payer: Medicare HMO | Admitting: Anesthesiology

## 2022-06-02 ENCOUNTER — Other Ambulatory Visit: Payer: Self-pay

## 2022-06-02 ENCOUNTER — Ambulatory Visit (HOSPITAL_COMMUNITY): Payer: Medicare HMO | Admitting: Anesthesiology

## 2022-06-02 ENCOUNTER — Observation Stay (HOSPITAL_COMMUNITY)
Admission: RE | Admit: 2022-06-02 | Discharge: 2022-06-03 | Disposition: A | Discharge status: Home / Self Care | Payer: Medicare HMO | Attending: Internal Medicine | Admitting: Internal Medicine

## 2022-06-02 ENCOUNTER — Encounter (HOSPITAL_COMMUNITY)
Admission: RE | Disposition: A | Discharge status: Home / Self Care | Payer: Self-pay | Source: Home / Self Care | Attending: Family Medicine

## 2022-06-02 ENCOUNTER — Ambulatory Visit (HOSPITAL_COMMUNITY): Payer: Medicare HMO

## 2022-06-02 ENCOUNTER — Encounter (HOSPITAL_COMMUNITY): Payer: Self-pay | Admitting: Gastroenterology

## 2022-06-02 DIAGNOSIS — E7800 Pure hypercholesterolemia, unspecified: Secondary | ICD-10-CM

## 2022-06-02 DIAGNOSIS — J9601 Acute respiratory failure with hypoxia: Secondary | ICD-10-CM | POA: Diagnosis not present

## 2022-06-02 DIAGNOSIS — I10 Essential (primary) hypertension: Secondary | ICD-10-CM | POA: Diagnosis not present

## 2022-06-02 DIAGNOSIS — Z96612 Presence of left artificial shoulder joint: Secondary | ICD-10-CM | POA: Insufficient documentation

## 2022-06-02 DIAGNOSIS — K8050 Calculus of bile duct without cholangitis or cholecystitis without obstruction: Secondary | ICD-10-CM | POA: Diagnosis not present

## 2022-06-02 DIAGNOSIS — K838 Other specified diseases of biliary tract: Secondary | ICD-10-CM

## 2022-06-02 DIAGNOSIS — R339 Retention of urine, unspecified: Secondary | ICD-10-CM | POA: Diagnosis not present

## 2022-06-02 DIAGNOSIS — Z9049 Acquired absence of other specified parts of digestive tract: Secondary | ICD-10-CM | POA: Diagnosis not present

## 2022-06-02 DIAGNOSIS — I4891 Unspecified atrial fibrillation: Secondary | ICD-10-CM | POA: Diagnosis present

## 2022-06-02 DIAGNOSIS — I2510 Atherosclerotic heart disease of native coronary artery without angina pectoris: Secondary | ICD-10-CM | POA: Diagnosis not present

## 2022-06-02 DIAGNOSIS — I25119 Atherosclerotic heart disease of native coronary artery with unspecified angina pectoris: Secondary | ICD-10-CM | POA: Diagnosis not present

## 2022-06-02 DIAGNOSIS — K317 Polyp of stomach and duodenum: Secondary | ICD-10-CM | POA: Diagnosis not present

## 2022-06-02 DIAGNOSIS — R0602 Shortness of breath: Secondary | ICD-10-CM | POA: Diagnosis not present

## 2022-06-02 DIAGNOSIS — R338 Other retention of urine: Secondary | ICD-10-CM

## 2022-06-02 DIAGNOSIS — Z87891 Personal history of nicotine dependence: Secondary | ICD-10-CM | POA: Insufficient documentation

## 2022-06-02 DIAGNOSIS — R932 Abnormal findings on diagnostic imaging of liver and biliary tract: Secondary | ICD-10-CM | POA: Insufficient documentation

## 2022-06-02 DIAGNOSIS — K2289 Other specified disease of esophagus: Secondary | ICD-10-CM | POA: Diagnosis not present

## 2022-06-02 DIAGNOSIS — Z96652 Presence of left artificial knee joint: Secondary | ICD-10-CM | POA: Insufficient documentation

## 2022-06-02 DIAGNOSIS — I509 Heart failure, unspecified: Secondary | ICD-10-CM | POA: Diagnosis not present

## 2022-06-02 DIAGNOSIS — Z96611 Presence of right artificial shoulder joint: Secondary | ICD-10-CM | POA: Diagnosis not present

## 2022-06-02 DIAGNOSIS — K315 Obstruction of duodenum: Secondary | ICD-10-CM

## 2022-06-02 DIAGNOSIS — Z9889 Other specified postprocedural states: Secondary | ICD-10-CM | POA: Insufficient documentation

## 2022-06-02 DIAGNOSIS — I480 Paroxysmal atrial fibrillation: Secondary | ICD-10-CM | POA: Insufficient documentation

## 2022-06-02 DIAGNOSIS — E785 Hyperlipidemia, unspecified: Secondary | ICD-10-CM | POA: Diagnosis present

## 2022-06-02 DIAGNOSIS — J449 Chronic obstructive pulmonary disease, unspecified: Secondary | ICD-10-CM | POA: Diagnosis not present

## 2022-06-02 DIAGNOSIS — Z79899 Other long term (current) drug therapy: Secondary | ICD-10-CM | POA: Insufficient documentation

## 2022-06-02 DIAGNOSIS — M1990 Unspecified osteoarthritis, unspecified site: Secondary | ICD-10-CM | POA: Diagnosis not present

## 2022-06-02 DIAGNOSIS — K319 Disease of stomach and duodenum, unspecified: Secondary | ICD-10-CM | POA: Diagnosis not present

## 2022-06-02 DIAGNOSIS — K2970 Gastritis, unspecified, without bleeding: Secondary | ICD-10-CM

## 2022-06-02 DIAGNOSIS — I110 Hypertensive heart disease with heart failure: Secondary | ICD-10-CM | POA: Diagnosis not present

## 2022-06-02 DIAGNOSIS — K831 Obstruction of bile duct: Secondary | ICD-10-CM

## 2022-06-02 DIAGNOSIS — I4819 Other persistent atrial fibrillation: Secondary | ICD-10-CM | POA: Diagnosis present

## 2022-06-02 HISTORY — PX: SPHINCTEROTOMY: SHX5544

## 2022-06-02 HISTORY — PX: PANCREATIC STENT PLACEMENT: SHX5539

## 2022-06-02 HISTORY — PX: POLYPECTOMY: SHX5525

## 2022-06-02 HISTORY — PX: ENDOSCOPIC RETROGRADE CHOLANGIOPANCREATOGRAPHY (ERCP) WITH PROPOFOL: SHX5810

## 2022-06-02 HISTORY — PX: BIOPSY: SHX5522

## 2022-06-02 HISTORY — PX: HEMOSTASIS CLIP PLACEMENT: SHX6857

## 2022-06-02 HISTORY — PX: ESOPHAGOGASTRODUODENOSCOPY (EGD) WITH PROPOFOL: SHX5813

## 2022-06-02 SURGERY — ENDOSCOPIC RETROGRADE CHOLANGIOPANCREATOGRAPHY (ERCP) WITH PROPOFOL
Anesthesia: General

## 2022-06-02 MED ORDER — SUGAMMADEX SODIUM 200 MG/2ML IV SOLN
INTRAVENOUS | Status: DC | PRN
  Administered 2022-06-02: 150 mg via INTRAVENOUS

## 2022-06-02 MED ORDER — FINASTERIDE 5 MG PO TABS
5.0000 mg | ORAL_TABLET | Freq: Every day | ORAL | Status: DC
  Administered 2022-06-03: 5 mg via ORAL
  Filled 2022-06-02 (×2): qty 1

## 2022-06-02 MED ORDER — DEXAMETHASONE SODIUM PHOSPHATE 10 MG/ML IJ SOLN
INTRAMUSCULAR | Status: DC | PRN
  Administered 2022-06-02: 5 mg via INTRAVENOUS

## 2022-06-02 MED ORDER — SUCRALFATE 1 G PO TABS: 1.0000 g | ORAL_TABLET | Freq: Two times a day (BID) | ORAL | 0 refills | Status: DC

## 2022-06-02 MED ORDER — FUROSEMIDE 10 MG/ML IJ SOLN
10.0000 mg | Freq: Once | INTRAMUSCULAR | Status: AC
  Administered 2022-06-02: 10 mg via INTRAVENOUS

## 2022-06-02 MED ORDER — LACTATED RINGERS IV SOLN
INTRAVENOUS | Status: DC
  Administered 2022-06-02: 10 mL via INTRAVENOUS

## 2022-06-02 MED ORDER — ORAL CARE MOUTH RINSE: 15.0000 mL | OROMUCOSAL | Status: DC | PRN

## 2022-06-02 MED ORDER — IPRATROPIUM-ALBUTEROL 0.5-2.5 (3) MG/3ML IN SOLN
RESPIRATORY_TRACT | Status: AC
  Administered 2022-06-02: 3 mL via RESPIRATORY_TRACT
  Filled 2022-06-02: qty 3

## 2022-06-02 MED ORDER — INDOMETHACIN 50 MG RE SUPP
RECTAL | Status: AC
  Filled 2022-06-02: qty 2

## 2022-06-02 MED ORDER — CHLORHEXIDINE GLUCONATE CLOTH 2 % EX PADS
6.0000 | MEDICATED_PAD | Freq: Every day | CUTANEOUS | Status: DC
  Administered 2022-06-03: 6 via TOPICAL

## 2022-06-02 MED ORDER — GLUCAGON HCL RDNA (DIAGNOSTIC) 1 MG IJ SOLR
INTRAMUSCULAR | Status: AC
  Filled 2022-06-02: qty 1

## 2022-06-02 MED ORDER — IPRATROPIUM-ALBUTEROL 0.5-2.5 (3) MG/3ML IN SOLN: 3.0000 mL | Freq: Four times a day (QID) | RESPIRATORY_TRACT | Status: DC

## 2022-06-02 MED ORDER — LIDOCAINE HCL (CARDIAC) PF 100 MG/5ML IV SOSY
PREFILLED_SYRINGE | INTRAVENOUS | Status: DC | PRN
  Administered 2022-06-02: 100 mg via INTRAVENOUS

## 2022-06-02 MED ORDER — PANTOPRAZOLE SODIUM 40 MG PO TBEC: 40.0000 mg | DELAYED_RELEASE_TABLET | Freq: Two times a day (BID) | ORAL | 6 refills | Status: DC

## 2022-06-02 MED ORDER — CIPROFLOXACIN IN D5W 400 MG/200ML IV SOLN
INTRAVENOUS | Status: AC
  Filled 2022-06-02: qty 200

## 2022-06-02 MED ORDER — PROPOFOL 10 MG/ML IV BOLUS
INTRAVENOUS | Status: DC | PRN
  Administered 2022-06-02: 100 mg via INTRAVENOUS

## 2022-06-02 MED ORDER — FUROSEMIDE 10 MG/ML IJ SOLN: 40.0000 mg | Freq: Once | INTRAMUSCULAR | Status: AC

## 2022-06-02 MED ORDER — FUROSEMIDE 10 MG/ML IJ SOLN
INTRAMUSCULAR | Status: AC
  Administered 2022-06-02: 40 mg via INTRAVENOUS
  Filled 2022-06-02: qty 4

## 2022-06-02 MED ORDER — GLUCAGON HCL RDNA (DIAGNOSTIC) 1 MG IJ SOLR
INTRAMUSCULAR | Status: DC | PRN
  Administered 2022-06-02 (×5): .25 mg via INTRAVENOUS

## 2022-06-02 MED ORDER — SODIUM CHLORIDE 0.9 % IV SOLN: INTRAVENOUS | Status: DC

## 2022-06-02 MED ORDER — FUROSEMIDE 10 MG/ML IJ SOLN
INTRAMUSCULAR | Status: AC
  Filled 2022-06-02: qty 4

## 2022-06-02 MED ORDER — TAMSULOSIN HCL 0.4 MG PO CAPS
0.4000 mg | ORAL_CAPSULE | Freq: Every day | ORAL | Status: DC
  Administered 2022-06-03: 0.4 mg via ORAL
  Filled 2022-06-02 (×2): qty 1

## 2022-06-02 MED ORDER — FUROSEMIDE 10 MG/ML IJ SOLN
INTRAMUSCULAR | Status: DC | PRN
  Administered 2022-06-02: 10 mg via INTRAMUSCULAR

## 2022-06-02 MED ORDER — FENTANYL CITRATE (PF) 100 MCG/2ML IJ SOLN
INTRAMUSCULAR | Status: AC
  Filled 2022-06-02: qty 2

## 2022-06-02 MED ORDER — FENTANYL CITRATE (PF) 100 MCG/2ML IJ SOLN
INTRAMUSCULAR | Status: DC | PRN
  Administered 2022-06-02: 100 ug via INTRAVENOUS

## 2022-06-02 MED ORDER — WARFARIN SODIUM 5 MG PO TABS: 2.5000 mg | ORAL_TABLET | Freq: Every day | ORAL | Status: DC

## 2022-06-02 MED ORDER — ROCURONIUM BROMIDE 10 MG/ML (PF) SYRINGE
PREFILLED_SYRINGE | INTRAVENOUS | Status: DC | PRN
  Administered 2022-06-02: 60 mg via INTRAVENOUS

## 2022-06-02 MED ORDER — CIPROFLOXACIN IN D5W 400 MG/200ML IV SOLN
INTRAVENOUS | Status: DC | PRN
  Administered 2022-06-02: 400 mg via INTRAVENOUS

## 2022-06-02 MED ORDER — PHENYLEPHRINE 80 MCG/ML (10ML) SYRINGE FOR IV PUSH (FOR BLOOD PRESSURE SUPPORT)
PREFILLED_SYRINGE | INTRAVENOUS | Status: DC | PRN
  Administered 2022-06-02: 80 ug via INTRAVENOUS
  Administered 2022-06-02: 240 ug via INTRAVENOUS

## 2022-06-02 MED ORDER — PHENYLEPHRINE HCL-NACL 20-0.9 MG/250ML-% IV SOLN
INTRAVENOUS | Status: DC | PRN
  Administered 2022-06-02: 40 ug/min via INTRAVENOUS

## 2022-06-02 MED ORDER — INDOMETHACIN 50 MG RE SUPP
RECTAL | Status: DC | PRN
  Administered 2022-06-02: 100 mg via RECTAL

## 2022-06-02 MED ORDER — ONDANSETRON HCL 4 MG/2ML IJ SOLN
INTRAMUSCULAR | Status: DC | PRN
  Administered 2022-06-02: 4 mg via INTRAVENOUS

## 2022-06-02 SURGICAL SUPPLY — 15 items

## 2022-06-02 NOTE — Anesthesia Procedure Notes (Signed)
Procedure Name: Intubation Date/Time: 06/02/2022 1:58 PM  Performed by: Raenette Rover, CRNAPre-anesthesia Checklist: Patient identified, Emergency Drugs available, Suction available and Patient being monitored Patient Re-evaluated:Patient Re-evaluated prior to induction Oxygen Delivery Method: Circle system utilized Preoxygenation: Pre-oxygenation with 100% oxygen Induction Type: IV induction Ventilation: Mask ventilation without difficulty and Oral airway inserted - appropriate to patient size Laryngoscope Size: Sabra Heck and 2 Grade View: Grade I Tube type: Oral Tube size: 7.5 mm Number of attempts: 1 Airway Equipment and Method: Stylet and Oral airway Placement Confirmation: ETT inserted through vocal cords under direct vision, positive ETCO2 and breath sounds checked- equal and bilateral Secured at: 22 cm Tube secured with: Tape Dental Injury: Teeth and Oropharynx as per pre-operative assessment

## 2022-06-02 NOTE — ED Notes (Deleted)
History and Physical    Patient: [name]:[id number] DOB: 1945-12-12 DOA: 06/02/2022 DOS: the patient was seen and examined on 06/03/2022 PCP: Axel Filler, MD  Patient coming from: Home  Chief Complaint: No chief complaint on file.  HPI: [name] is a 76 y.o. male with medical history significant of atrial fibrillation on Coumadin, HTN, emphysema, CAD, HLD, pre-diabetes, choledocholithiasis and bladder repair s/p repair who we are being consulted by [facility] Dr. Rush Landmark for acute hypoxic respiratory failure following ERCP.   Pt has had a complicated GI course in the past few months regarding his gallbladder. In July 2023, he presented with septic shock secondary to acute cholecystitis, non-obstructing choledocholithiasis. He was too ill to tolerate surgery and was discharged with percutaneous chole tube. Hospitalization at that time also complicated by bladder rupture s/p repair with urology. He subsequently followed up in October and had lap chole and ERCP. He required 24 hrs intubation post-op due to respiratory distress. Despite ERCP and IOC, either general surgery nor GI was able to clear stones in his CBP. He presented today to have repeat ERCP subsequently requiring biliary cannulation and pancreatic duct stent.   Post-operatively had increasing oxygen requirement. CXR with bilateral opacity concerning for effusion vs atelectasis. He was based on non-rebreather 15L with O2 93-94% but was progressively able to wean down to 5-7L simple mask following '20mg'$  of IV Lasix.PCCM was consulted by GI and was evaluated by Dr. Verlee Monte who felt hypoxic seems to be related to post-op atelectasis and to admit to stepdown with hospitalist.   Pt alert and was at 94% O2 saturation on 5L [place]. States he feels fine otherwise and no shortness of breath or cough. Having urinary retention following Lasix.    Review of Systems: As mentioned in the history of present illness. All  other systems reviewed and are negative. Past Medical History:  Diagnosis Date   Allergic rhinitis 08/08/2013   takes Loratadine daily    Aortic stenosis 09/08/2011   With mild aortic regurgitation, mean gradient 13 mmHg    Cataract    right but immature   CHF (congestive heart failure) ([place]) 8119   Complication of anesthesia    stopped breathing - during shoulder surgery 2009-2010   Coronary artery disease 10/02/2009   Cardiac cath (October 2007): 20% left main, 60% mid LAD, 60-70% distal LAD, 30-40% first diagonal, 30% circumflex, 40% OM, 30-40% RCA    Degenerative joint disease of shoulder 08/03/2013   Bilateral, s/p right total shoulder arthroplasty 05/29/2011 and left shoulder arthroplasty 14/78/2956   Diastolic dysfunction 21/30/8657   Echo (04/04/2016): Grade I   Diverticulosis 10/07/2013   Seen on colonoscopy in 2007    Erectile dysfunction 05/07/2009   Essential hypertension 08/03/2013   had been on Lisinopril but stopped per MD   First degree AV block 03/14/2016   Gastric ulcer 10/07/2013   Seen on EGD 04/10/2006    Hemorrhoids 10/07/2013   Hyperlipidemia 10/02/2009   Paroxysmal atrial fibrillation ([place]) 07/14/2006   One episode, provoked by alcohol use disorder, briefly anticoagulated with warfarin, then switched to aspirin alone   Sciatica associated with disorder of lumbar spine 08/03/2013   Anterolisthesis with right L 4-5 nerve root compression.  Treated with epidural injections and Physical Therapy.    Seborrheic keratosis 10/07/2013   Tubular adenoma of colon    Past Surgical History:  Procedure Laterality Date   CARDIAC CATHETERIZATION  2007   CARDIOVERSION     CARDIOVERSION N/A 08/04/2019  Procedure: CARDIOVERSION;  Surgeon: Fay Records, MD;  Location: [facility];  Service: Cardiovascular;  Laterality: N/A;   CHOLECYSTECTOMY N/A 04/21/2022   Procedure: LAPAROSCOPIC CHOLECYSTECTOMY;  Surgeon: Jesusita Oka, MD;  Location: [place];  Service: General;   Laterality: N/A;   COLONOSCOPY     CYSTOSCOPY N/A 01/04/2022   Procedure: CYSTOSCOPY;  Surgeon: Raynelle Bring, MD;  Location: [place];  Service: Urology;  Laterality: N/A;   ERCP N/A 04/21/2022   Procedure: ATTEMPTED ENDOSCOPIC RETROGRADE CHOLANGIOPANCREATOGRAPHY (ERCP);  Surgeon: Gatha Mayer, MD;  Location: [place];  Service: Gastroenterology;  Laterality: N/A;   EYE SURGERY Bilateral 2022   bilateral cataracts   INSERTION OF MESH N/A 03/11/2019   Procedure: Insertion Of Mesh;  Surgeon: Ralene Ok, MD;  Location: [place];  Service: General;  Laterality: N/A;   INTRAOPERATIVE CHOLANGIOGRAM N/A 04/21/2022   Procedure: INTRAOPERATIVE CHOLANGIOGRAM;  Surgeon: Jesusita Oka, MD;  Location: [place];  Service: General;  Laterality: N/A;   IR EXCHANGE BILIARY DRAIN  02/14/2022   IR PERC CHOLECYSTOSTOMY  12/29/2021   JOINT REPLACEMENT     KNEE ARTHROSCOPY WITH MENISCAL REPAIR Right 01/30/2011   LAPAROTOMY N/A 01/04/2022   Procedure: EXPLORATORY LAPAROTOMY WITH EXTRAPERITONEAL BLADDER REPAIR;  Surgeon: Raynelle Bring, MD;  Location: [place];  Service: Urology;  Laterality: N/A;   right finger surgery     as a child   TONSILLECTOMY     TOTAL KNEE ARTHROPLASTY Left 07/28/2017   Procedure: LEFT TOTAL KNEE ARTHROPLASTY;  Surgeon: Renette Butters, MD;  Location: [place];  Service: Orthopedics;  Laterality: Left;   TOTAL SHOULDER ARTHROPLASTY  05/29/2011   Procedure: TOTAL SHOULDER ARTHROPLASTY;  Surgeon: Metta Clines Supple;  Location: [place];  Service: Orthopedics;  Laterality: Right;   TOTAL SHOULDER ARTHROPLASTY Left 06/08/2014   DR SUPPLE   TOTAL SHOULDER ARTHROPLASTY Left 06/08/2014   Procedure: LEFT TOTAL SHOULDER ARTHROPLASTY;  Surgeon: Marin Shutter, MD;  Location: [place];  Service: Orthopedics;  Laterality: Left;   UMBILICAL HERNIA REPAIR N/A 03/11/2019   Procedure: LAPAROSCOPIC UMBILICAL HERNIA;  Surgeon: Ralene Ok, MD;  Location: [place];  Service: General;  Laterality: N/A;   Social  History:  reports that he quit smoking about 11 years ago. His smoking use included cigarettes. He started smoking about 53 years ago. He has a 63.00 pack-year smoking history. He has never used smokeless tobacco. He reports that he does not drink alcohol and does not use drugs.  No Known Allergies  Family History  Problem Relation Age of Onset   Bradycardia Mother 65       Requiring pacemeaker placement   Coronary artery disease Father 51       Died of myocardial infarction   Coronary artery disease Brother 3       Died of myocardial infarction   Obesity Daughter    Healthy Son    Colon cancer Neg Hx    Esophageal cancer Neg Hx    Stomach cancer Neg Hx    Colon polyps Neg Hx     Prior to Admission medications   Medication Sig Start Date End Date Taking? Authorizing Provider  acetaminophen (TYLENOL) 500 MG tablet Take 2 tablets (1,000 mg total) by mouth 4 (four) times daily. 04/23/22  Yes Jesusita Oka, MD  atorvastatin (LIPITOR) 40 MG tablet Take 1 tablet (40 mg total) by mouth daily. 01/20/22  Yes Lacinda Axon, MD  carvedilol (COREG) 6.25 MG tablet Take 1 tablet (6.25 mg total)  by mouth 2 (two) times daily with a meal. 01/20/22  Yes Amponsah, Charisse March, MD  docusate sodium (COLACE) 100 MG capsule Take 1 capsule (100 mg total) by mouth 2 (two) times daily. 04/23/22  Yes Lovick, Montel Culver, MD  ePHEDrine HCl (PRIMATENE) 12.5 MG TABS Take 12.5 mg by mouth daily as needed (wheezing).   Yes [provider]  finasteride (PROSCAR) 5 MG tablet Take 1 tablet (5 mg total) by mouth daily. 01/21/22  Yes Lacinda Axon, MD  Fluticasone Propionate (GOODSENSE 24-HR ALLERGY NASAL NA) Place 1 spray into the nose daily as needed (allergies).   Yes [provider]  ibuprofen (ADVIL) 600 MG tablet Take 1 tablet (600 mg total) by mouth 4 (four) times daily. Patient taking differently: Take 600 mg by mouth every 6 (six) hours as needed for moderate pain. 04/23/22  Yes Jesusita Oka, MD  loratadine (CLARITIN) 10 MG tablet Take 10 mg by mouth daily as needed (wheezing).   Yes [provider]  losartan (COZAAR) 50 MG tablet TAKE 1 TABLET EVERY DAY 04/30/22  Yes Crenshaw, Denice Bors, MD  methocarbamol (ROBAXIN) 750 MG tablet Take 1 tablet (750 mg total) by mouth 4 (four) times daily. 04/23/22  Yes Jesusita Oka, MD  pantoprazole (PROTONIX) 40 MG tablet Take 1 tablet (40 mg total) by mouth 2 (two) times daily before a meal. 06/02/22 06/02/23 Yes Mansouraty, Telford Nab., MD  silodosin (RAPAFLO) 8 MG CAPS capsule Take 1 capsule (8 mg total) by mouth daily with breakfast. 02/27/22  Yes Axel Filler, MD  sucralfate (CARAFATE) 1 g tablet Take 1 tablet (1 g total) by mouth 2 (two) times daily for 14 days. 06/02/22 06/16/22 Yes Mansouraty, Telford Nab., MD  warfarin (COUMADIN) 5 MG tablet Take 0.5 tablets (2.5 mg total) by mouth daily. 06/03/22   Mansouraty, Telford Nab., MD    Physical Exam: Vitals:   06/02/22 2210 06/02/22 2313 06/02/22 2340 06/03/22 0000  BP: (!) 153/99 (!) 161/109 (!) 168/108 (!) 186/78  Pulse: 83 86 78 85  Resp: (!) '22 18 19 '$ (!) 22  Temp:  (!) 97.4 F (36.3 C) (!) 96.6 F (35.9 C)   TempSrc:   Rectal   SpO2: 96% (!) 82% 90% 97%  Weight:   74.6 kg   Height:   '5\' 6"'$  (1.676 m)    Constitutional: NAD, calm, comfortable, pale appearing elderly male sitting upright in bed Eyes: lids and conjunctivae normal ENMT: Mucous membranes are moist.  Neck: normal, supple Respiratory: clear to auscultation bilaterally, no wheezing, no crackles. Normal respiratory effort. No accessory muscle use.  On 5 L via nasal cannula. Cardiovascular: Regular rate and rhythm, systolic flow murmur.  No extremity edema.  Abdomen: firm mildly distended abdomen with no tenderness, bowel sounds positive.  Musculoskeletal: no clubbing / cyanosis. No joint deformity upper and lower extremities. Good ROM, no contractures. Normal muscle tone.  Skin: no rashes, lesions,  ulcers. No induration Neurologic: CN 2-12 grossly intact.  Strength 5/5 in all 4.  Psychiatric: Normal judgment and insight. Alert and oriented x 3. Normal mood. Data Reviewed:  See HPI  Assessment and Plan: * Acute respiratory failure with hypoxia (HCC) -Acute post-op hypoxia with CXR revealing for bilateral pleural effusion and atelectasis  -Initially required 15L NRB now able to wean down to 5L via [place] following '20mg'$  IV Lasix -PCCM evaluated and recommended additional IV '40mg'$  Lasix -will follow intake and output -Goal O2 >92% -Bronchodilator PRN  Acute urinary retention -unable  to void despite IV '20mg'$  Lasix. Likely post-op atony.  -bladder scan ordered give hx of extraperitoneal bladder rupture early this year- scan showed 575m -will obtain in and out cath-RN reports that it was a difficulty insertion. Pt has hx of requiring urethral meatus dilation with urology in the past. -continue to monitor urine output -continue rapaflow and proscar  Choledocholithiasis Complicated GI course with cholecystitis in July requiring percutaneous cholecystostomy tube, subsequent cholecystectomy and failed ERCP despite abnormal IOP. -Repeat ERCP attempt today subsequently requiring sphincterotomy and pancreatic stent placement -Per GI, if patient has obstructive LFT pattern would need to pursue IR PTBD versus [facility] to continue to follow  -recommends clear liquid diet post-op and advance diet as tolerated -recommends resuming warfarin for a.fib tomorrow -continue PPI BID and carafate 1g BID  Atrial fibrillation (HCC) -rate controlled -continue beta-blocker -resume warfarin tomorrow at GI recommendation. Check INR tomorrow.   Essential hypertension -mildly elevated -will be receiving additional IV Lasix for respiratory issue -continue home antihypertensive  CAD (coronary artery disease) -to resume Coumadin for a fib tomorrow -continue  beta-blocker, statin  Hyperlipidemia -continue statin      Advance Care Planning:   Code Status: DNR -confirmed with pt at bedside  [facility]  Family Communication: none at bedside  Severity of Illness: The appropriate patient status for this patient is OBSERVATION. Observation status is judged to be reasonable and necessary in order to provide the required intensity of service to ensure the patient's safety. The patient's presenting symptoms, physical exam findings, and initial radiographic and laboratory data in the context of their medical condition is felt to place them at decreased risk for further clinical deterioration. Furthermore, it is anticipated that the patient will be medically stable for discharge from the hospital within 2 midnights of admission.   Author: COrene Desanctis DO 06/03/2022 1:54 AM  For on call review [email or web address]

## 2022-06-02 NOTE — Assessment & Plan Note (Addendum)
-  unable to void despite IV '20mg'$  Lasix. Likely post-op atony.  -bladder scan ordered give hx of extraperitoneal bladder rupture early this year- scan showed 549m -will obtain in and out cath-RN reports that it was a difficulty insertion. Pt has hx of requiring urethral meatus dilation with urology in the past. -continue to monitor urine output -continue rapaflow and proscar

## 2022-06-02 NOTE — Anesthesia Preprocedure Evaluation (Signed)
Anesthesia Evaluation  Patient identified by MRN, date of birth, ID band Patient awake    Reviewed: Allergy & Precautions, NPO status , Patient's Chart, lab work & pertinent test results, reviewed documented beta blocker date and time   History of Anesthesia Complications Negative for: history of anesthetic complications  Airway Mallampati: II  TM Distance: >3 FB Neck ROM: Full    Dental  (+) Edentulous Upper, Edentulous Lower   Pulmonary COPD, former smoker   Pulmonary exam normal        Cardiovascular hypertension, Pt. on medications and Pt. on home beta blockers + CAD and +CHF  Normal cardiovascular exam+ dysrhythmias Atrial Fibrillation   TTE 12/2021: EF 60-65%, moderate LAE, mild MR, mild AR, moderate AS    Neuro/Psych    GI/Hepatic PUD,,,  Endo/Other    Renal/GU      Musculoskeletal  (+) Arthritis ,    Abdominal   Peds  Hematology   Anesthesia Other Findings   Reproductive/Obstetrics                             Anesthesia Physical Anesthesia Plan  ASA: 3  Anesthesia Plan: General   Post-op Pain Management: Minimal or no pain anticipated   Induction: Intravenous  PONV Risk Score and Plan: 2 and Treatment may vary due to age or medical condition, Ondansetron and Dexamethasone  Airway Management Planned: Oral ETT  Additional Equipment: None  Intra-op Plan:   Post-operative Plan: Extubation in OR  Informed Consent: I have reviewed the patients History and Physical, chart, labs and discussed the procedure including the risks, benefits and alternatives for the proposed anesthesia with the patient or authorized representative who has indicated his/her understanding and acceptance.     Dental advisory given  Plan Discussed with: CRNA  Anesthesia Plan Comments:         Anesthesia Quick Evaluation

## 2022-06-02 NOTE — Assessment & Plan Note (Signed)
-  Acute post-op hypoxia with CXR revealing for bilateral pleural effusion and atelectasis  -Initially required 15L NRB now able to wean down to 5L via [place] following '20mg'$  IV Lasix -PCCM evaluated and recommended additional IV '40mg'$  Lasix -will follow intake and output -Goal O2 >92% -Bronchodilator PRN

## 2022-06-02 NOTE — Transfer of Care (Signed)
Immediate Anesthesia Transfer of Care Note  Patient: AXCEL HORSCH  Procedure(s) Performed: ENDOSCOPIC RETROGRADE CHOLANGIOPANCREATOGRAPHY (ERCP) WITH PROPOFOL ESOPHAGOGASTRODUODENOSCOPY (EGD) WITH PROPOFOL SPHINCTEROTOMY PANCREATIC STENT PLACEMENT HEMOSTASIS CLIP PLACEMENT POLYPECTOMY BIOPSY  Patient Location: Endoscopy Unit  Anesthesia Type:General  Level of Consciousness: awake, drowsy, and patient cooperative  Airway & Oxygen Therapy: Patient Spontanous Breathing and Patient connected to face mask oxygen  Post-op Assessment: Report given to RN, Post -op Vital signs reviewed and stable, and Post -op Vital signs reviewed and unstable, Anesthesiologist notified  Post vital signs: Reviewed, stable, and unstable--decreased spo2 on 10L [place]; MDA called and given report to make aware; lungs sound wet-sounding; will give Lasix and get CXR per MDA  Last Vitals:  Vitals Value Taken Time  BP 135/77 06/02/22 1640  Temp    Pulse 78 06/02/22 1644  Resp 18 06/02/22 1644  SpO2 89 % 06/02/22 1644  Vitals shown include unvalidated device data.  Last Pain:  Vitals:   06/02/22 1153  TempSrc: Tympanic  PainSc: 0-No pain         Complications: No notable events documented.

## 2022-06-02 NOTE — Assessment & Plan Note (Signed)
-  rate controlled -continue beta-blocker -resume warfarin tomorrow at GI recommendation. Check INR tomorrow.

## 2022-06-02 NOTE — Assessment & Plan Note (Addendum)
-  to resume Coumadin for a fib tomorrow -continue beta-blocker, statin

## 2022-06-02 NOTE — H&P (Signed)
GASTROENTEROLOGY PROCEDURE H&P NOTE   Primary Care Physician: Axel Filler, MD  HPI: [name] is a 76 y.o. male who presents for ERCP attempt at biliary cannulation in setting of positive MRI/MRCP and well as positive IOC, however failed biliary cannulation at recent ERCP attempt by other provider due to biliary orifice completely within a diverticulum.  EUS to be available.  Past Medical History:  Diagnosis Date   Allergic rhinitis 08/08/2013   takes Loratadine daily    Aortic stenosis 09/08/2011   With mild aortic regurgitation, mean gradient 13 mmHg    Cataract    right but immature   CHF (congestive heart failure) ([place]) 4268   Complication of anesthesia    stopped breathing - during shoulder surgery 2009-2010   Coronary artery disease 10/02/2009   Cardiac cath (October 2007): 20% left main, 60% mid LAD, 60-70% distal LAD, 30-40% first diagonal, 30% circumflex, 40% OM, 30-40% RCA    Degenerative joint disease of shoulder 08/03/2013   Bilateral, s/p right total shoulder arthroplasty 05/29/2011 and left shoulder arthroplasty 34/19/6222   Diastolic dysfunction 97/98/9211   Echo (04/04/2016): Grade I   Diverticulosis 10/07/2013   Seen on colonoscopy in 2007    Erectile dysfunction 05/07/2009   Essential hypertension 08/03/2013   had been on Lisinopril but stopped per MD   First degree AV block 03/14/2016   Gastric ulcer 10/07/2013   Seen on EGD 04/10/2006    Hemorrhoids 10/07/2013   Hyperlipidemia 10/02/2009   Paroxysmal atrial fibrillation ([place]) 07/14/2006   One episode, provoked by alcohol use disorder, briefly anticoagulated with warfarin, then switched to aspirin alone   Sciatica associated with disorder of lumbar spine 08/03/2013   Anterolisthesis with right L 4-5 nerve root compression.  Treated with epidural injections and Physical Therapy.    Seborrheic keratosis 10/07/2013   Tubular adenoma of colon    Past Surgical History:  Procedure  Laterality Date   CARDIAC CATHETERIZATION  2007   CARDIOVERSION     CARDIOVERSION N/A 08/04/2019   Procedure: CARDIOVERSION;  Surgeon: Fay Records, MD;  Location: [facility] ENDOSCOPY;  Service: Cardiovascular;  Laterality: N/A;   CHOLECYSTECTOMY N/A 04/21/2022   Procedure: LAPAROSCOPIC CHOLECYSTECTOMY;  Surgeon: Jesusita Oka, MD;  Location: [place];  Service: General;  Laterality: N/A;   COLONOSCOPY     CYSTOSCOPY N/A 01/04/2022   Procedure: Consuela Mimes;  Surgeon: Raynelle Bring, MD;  Location: [place];  Service: Urology;  Laterality: N/A;   ERCP N/A 04/21/2022   Procedure: ATTEMPTED ENDOSCOPIC RETROGRADE CHOLANGIOPANCREATOGRAPHY (ERCP);  Surgeon: Gatha Mayer, MD;  Location: [place];  Service: Gastroenterology;  Laterality: N/A;   EYE SURGERY Bilateral 2022   bilateral cataracts   INSERTION OF MESH N/A 03/11/2019   Procedure: Insertion Of Mesh;  Surgeon: Ralene Ok, MD;  Location: [place];  Service: General;  Laterality: N/A;   INTRAOPERATIVE CHOLANGIOGRAM N/A 04/21/2022   Procedure: INTRAOPERATIVE CHOLANGIOGRAM;  Surgeon: Jesusita Oka, MD;  Location: [place];  Service: General;  Laterality: N/A;   IR EXCHANGE BILIARY DRAIN  02/14/2022   IR PERC CHOLECYSTOSTOMY  12/29/2021   JOINT REPLACEMENT     KNEE ARTHROSCOPY WITH MENISCAL REPAIR Right 01/30/2011   LAPAROTOMY N/A 01/04/2022   Procedure: EXPLORATORY LAPAROTOMY WITH EXTRAPERITONEAL BLADDER REPAIR;  Surgeon: Raynelle Bring, MD;  Location: [place];  Service: Urology;  Laterality: N/A;   right finger surgery     as a child   TONSILLECTOMY     TOTAL KNEE ARTHROPLASTY Left 07/28/2017  Procedure: LEFT TOTAL KNEE ARTHROPLASTY;  Surgeon: Renette Butters, MD;  Location: [place];  Service: Orthopedics;  Laterality: Left;   TOTAL SHOULDER ARTHROPLASTY  05/29/2011   Procedure: TOTAL SHOULDER ARTHROPLASTY;  Surgeon: Metta Clines Supple;  Location: [place];  Service: Orthopedics;  Laterality: Right;   TOTAL SHOULDER ARTHROPLASTY Left 06/08/2014   DR  SUPPLE   TOTAL SHOULDER ARTHROPLASTY Left 06/08/2014   Procedure: LEFT TOTAL SHOULDER ARTHROPLASTY;  Surgeon: Marin Shutter, MD;  Location: [place];  Service: Orthopedics;  Laterality: Left;   UMBILICAL HERNIA REPAIR N/A 03/11/2019   Procedure: LAPAROSCOPIC UMBILICAL HERNIA;  Surgeon: Ralene Ok, MD;  Location: [place];  Service: General;  Laterality: N/A;   Current Facility-Administered Medications  Medication Dose Route Frequency Provider Last Rate Last Admin   lactated ringers infusion   Intravenous Continuous Mansouraty, Telford Nab., MD 10 mL/hr at 06/02/22 1244 Continued from Pre-op at 06/02/22 1244    Current Facility-Administered Medications:    lactated ringers infusion, , Intravenous, Continuous, Mansouraty, Telford Nab., MD, Last Rate: 10 mL/hr at 06/02/22 1244, Continued from Pre-op at 06/02/22 1244 No Known Allergies Family History  Problem Relation Age of Onset   Bradycardia Mother 4       Requiring pacemeaker placement   Coronary artery disease Father 53       Died of myocardial infarction   Coronary artery disease Brother 78       Died of myocardial infarction   Obesity Daughter    Healthy Son    Colon cancer Neg Hx    Esophageal cancer Neg Hx    Stomach cancer Neg Hx    Colon polyps Neg Hx    Social History   Socioeconomic History   Marital status: Married    Spouse name: MarjolinRetail banker   Number of children: 2   Years of education: Not on file   Highest education level: Not on file  Occupational History   Occupation: Retired  Tobacco Use   Smoking status: Former    Packs/day: 1.50    Years: 42.00    Total pack years: 63.00    Types: Cigarettes    Start date: 1970    Quit date: 07/14/2010    Years since quitting: 11.8   Smokeless tobacco: Never   Tobacco comments:    quit smoking about 41yr ago  Vaping Use   Vaping Use: Never used  Substance and Sexual Activity   Alcohol use: No    Comment: no alcohol since 07   Drug use: No   Sexual  activity: Never    Birth control/protection: None  Other Topics Concern   Not on file  [place]  Former CTeacher, early years/pre   Social Determinants of Health   Financial Resource Strain: Not on file  Food Insecurity: No Food Insecurity (04/25/2022)   Hunger Vital Sign    Worried About Running Out of Food in the Last Year: Never true    Ran Out of Food in the Last Year: Never true  Transportation Needs: No Transportation Needs (04/25/2022)   PRAPARE - THydrologist(Medical): No    Lack of Transportation (Non-Medical): No  Recent Concern: Transportation Needs - Unmet Transportation Needs (04/23/2022)   PRAPARE - THydrologist(Medical): Yes    Lack of Transportation (Non-Medical): Yes  Physical Activity: Not on file  Stress: Not on  file  Social Connections: Not on file  Intimate Partner Violence: Not At Risk (04/23/2022)   Humiliation, Afraid, Rape, and Kick questionnaire    Fear of Current or Ex-Partner: No    Emotionally Abused: No    Physically Abused: No    Sexually Abused: No    Physical Exam: Today's Vitals   06/02/22 1153  BP: (!) 171/95  Pulse: (!) 102  Resp: (!) 27  Temp: 97.6 F (36.4 C)  TempSrc: Tympanic  SpO2: 93%  Weight: 75.3 kg  Height: '5\' 6"'$  (1.676 m)  PainSc: 0-No pain   Body mass index is 26.79 kg/m. GEN: NAD EYE: Sclerae anicteric ENT: MMM CV: Non-tachycardic GI: Soft, NT/ND NEURO:  Alert & Oriented x 3  Lab Results: No results for input(s): "WBC", "HGB", "HCT", "PLT" in the last 72 hours. BMET No results for input(s): "NA", "K", "CL", "CO2", "GLUCOSE", "BUN", "CREATININE", "CALCIUM" in the last 72 hours. LFT No results for input(s): "PROT", "ALBUMIN", "AST", "ALT", "ALKPHOS", "BILITOT", "BILIDIR", "IBILI" in the last 72 hours. PT/INR No results for input(s): "LABPROT", "INR" in the last 72  hours.   Impression / Plan: This is a 76 y.o.male who presents for ERCP attempt at biliary cannulation in setting of positive MRI/MRCP and well as positive IOC, however failed biliary cannulation at recent ERCP attempt by other provider due to biliary orifice completely within a diverticulum.  EUS will be available.  The risks of an ERCP were discussed at length, including but not limited to the risk of perforation, bleeding, abdominal pain, post-ERCP pancreatitis (while usually mild can be severe and even life threatening).  The risks of an EUS including intestinal perforation, bleeding, infection, aspiration, and medication effects were discussed as was the possibility it may not give a definitive diagnosis if a biopsy is performed.  When a biopsy of the pancreas is done as part of the EUS, there is an additional risk of pancreatitis at the rate of about 1-2%.  It was explained that procedure related pancreatitis is typically mild, although it can be severe and even life threatening, which is why we do not perform random pancreatic biopsies and only biopsy a lesion/area we feel is concerning enough to warrant the risk.   The risks and benefits of endoscopic evaluation/treatment were discussed with the patient and/or family; these include but are not limited to the risk of perforation, infection, bleeding, missed lesions, lack of diagnosis, severe illness requiring hospitalization, as well as anesthesia and sedation related illnesses.  The patient's history has been reviewed, patient examined, no change in status, and deemed stable for procedure.  The patient and/or family is agreeable to proceed.    Justice Britain, MD [facility] Office # [phone]

## 2022-06-02 NOTE — Anesthesia Postprocedure Evaluation (Signed)
Anesthesia Post Note  Patient: [name]  Procedure(s) Performed: ENDOSCOPIC RETROGRADE CHOLANGIOPANCREATOGRAPHY (ERCP) WITH PROPOFOL ESOPHAGOGASTRODUODENOSCOPY (EGD) WITH PROPOFOL SPHINCTEROTOMY PANCREATIC STENT PLACEMENT HEMOSTASIS CLIP PLACEMENT POLYPECTOMY BIOPSY     Patient location during evaluation: PACU Anesthesia Type: General Level of consciousness: awake and alert Pain management: pain level controlled Vital Signs Assessment: post-procedure vital signs reviewed and stable Respiratory status: spontaneous breathing, nonlabored ventilation, respiratory function stable and patient connected to nasal cannula oxygen Cardiovascular status: blood pressure returned to baseline and stable Postop Assessment: no apparent nausea or vomiting Anesthetic complications: no   No notable events documented.  Last Vitals:  Vitals:   06/02/22 1845 06/02/22 1900  BP: (!) 151/92 (!) 155/86  Pulse: 93 77  Resp: 18 15  Temp:    SpO2: 92% 94%    Last Pain:  Vitals:   06/02/22 1900  TempSrc:   PainSc: 0-No pain                 Belenda Cruise P Neila Teem

## 2022-06-02 NOTE — Progress Notes (Addendum)
  Brief GI Progress Note  Patient was evaluated in the post procedure endoscopy recovery area.  He was having increasing oxygen requirements postextubation.  He was awake.  Chest x-ray stat was obtained with results suggestive of pleural effusions versus atelectasis versus pneumonia. Patient was transferred to the PACU for further monitoring and is receiving additional diuresis. Patient is on nonrebreather with oxygen saturations in the 93 to 94% range. He does not seem to have any postprocedural ERCP complications from the procedure itself. His history would surmise that his lung status is quite tenuous as it required earlier this year weeks on the ventilator and more recently days on the ventilator post cholecystectomy and ERCP attempt. Unfortunately it looks like the patient will need to come in for admission.  Observation at a minimum but may require longer period of monitoring. I am in the process of reaching out to the hospitalist service to see whether he could be a candidate for progressive unit or if the patient were to require ICU level of evaluation and then I will reach out to the ICU team. I have updated the patient's daughter and the patient's wife who remained in the recovery area. Appreciate anesthesia availability with monitoring the patient in the PACU at this time. I have paged out the [facility] admissions within the last 5 minutes.  And I am awaiting their call back.   Justice Britain, MD [facility] Office # [phone]    5:35 PM addendum Briefly spoke with [facility] hospitalist team. Based on the significant increased oxygen requirements at this time postextubation and procedure, they have asked that we reach out to [facility] critical care medicine to evaluate the patient and help [place] distinguish where the patient can be placed. I have paged [phone] and I am awaiting ICU callback so that I can discuss the case and hopefully get the patient evaluated  so we can figure out where he can go from the PACU as he will need admission.  Justice Britain, MD [facility] Office # [phone]     5:45 PM addendum Briefly spoke with ICU team and they will work on sending one of their providers to evaluate the patient.  Once the patient has been evaluated, we will decide whether the patient can be a ICU team admission versus a [facility] progressive care admission.  Justice Britain, MD [facility] Office # [phone]    6:15 PM addendum Spoke with ICU service and they have evaluated the patient.  Patient on decreasing O2 requirement 5 to 7 L simple mask. It is felt that patient will most likely be able to be admitted to [facility] for observation.  ICU service will keep on their list for consultation should something else change or develop overnight. I have paged the medicine service to see about evaluating the patient for hospitalization/observation. Awaiting callback.  Justice Britain, MD [facility] Office # [phone]

## 2022-06-02 NOTE — Op Note (Signed)
[facility] Patient Name: [name] Procedure Date: 06/02/2022 MRN: [id number] Attending MD: Justice Britain , MD, [id number] Date of Birth: 1945/09/11 CSN: [id number] Age: 76 Admit Type: Outpatient Procedure:                ERCP Indications:              Bile duct stone(s), Abnormal intraoperative                            cholangiogram, Filling defect on intraoperative                            cholangiogram, Prior failed Endoscopic Retrograde                            Cholangiopancreatography Providers:                Justice Britain, MD, Jeanella Cara, RN,                            Gloris Ham, Technician Referring MD:             Gatha Mayer, MD, Mallie Mussel Etheleen Mayhew Medicines:                General Anesthesia, Cipro 400 mg IV, Indomethacin                            100 mg PR, Glucagon 6.81 mg IV Complications:            No immediate complications. Estimated Blood Loss:     Estimated blood loss was minimal. Procedure:                Pre-Anesthesia Assessment:                           - Prior to the procedure, a History and Physical                            was performed, and patient medications and                            allergies were reviewed. The patient's tolerance of                            previous anesthesia was also reviewed. The risks                            and benefits of the procedure and the sedation                            options and risks were discussed with the patient.                            All questions were  answered, and informed consent                            was obtained. Prior Anticoagulants: The patient has                            taken Coumadin (warfarin), last dose was 5 days                            prior to procedure. ASA Grade Assessment: III - A                            patient with severe systemic disease. After                             reviewing the risks and benefits, the patient was                            deemed in satisfactory condition to undergo the                            procedure.                           After obtaining informed consent, the scope was                            passed under direct vision. Throughout the                            procedure, the patient's blood pressure, pulse, and                            oxygen saturations were monitored continuously. The                            [id number]-Q190V ([id number]) Olympus duodenoscope was                            introduced through the mouth, and used to inject                            contrast into and to cannulate the ventral                            pancreatic duct. The GIF-H190 ([id number]) Olympus                            endoscope was introduced through the mouth, and                            used to inject contrast into and used to locate the  major papilla. The ERCP was determined to be ASGE                            Difficulty Grade 3 due to challenging cannulation                            because of intradiverticular papilla and duodenal                            stenosis. Successful completion of the procedure                            was aided by performing the maneuvers documented                            (below) in this report. The patient tolerated the                            procedure. Scope In: Scope Out: Findings:      A scout film of the abdomen was obtained. Surgical clips, consistent       with a previous cholecystectomy, were seen in the area of the right       upper quadrant of the abdomen.      A standard esophagogastroduodenoscopy scope was used for the examination       of the upper gastrointestinal tract. The scope was passed under direct       vision through the upper GI tract. No gross lesions were noted in the       entire esophagus. The Z-line was  irregular and was found 41 cm from the       incisors. Multiple 3 to 11 mm semi-sessile polyps with no active       bleeding and one with stigmata of recent bleeding (erosion with heme on       it) were found in the gastric body - this polyp (that had recently bled)       was resected with cold snare polypectomy, and a single hemoclip was       placed for additional hemostasis. Segmental moderate inflammation       characterized by congestion (edema), erythema and friability was found       in the entire examined stomach otherwise- biopsied for HP evaluation. An       acquired benign-appearing, intrinsic moderate stenosis was found in the       duodenal sweep and was traversed with great care and after manipulating       slowly with the duodena scope (endoscope had no issues traversing this       area). The major papilla was located entirely within a diverticulum and       could not be visualized initially. Even with maneuvering and moving the       patient to the left lateral position and true ERCP semiprone position,       we could not visualize it. After using a sphincterotome to pull the       duodenal excess tissue inferiorly and to the left, we were able to       visualize the major papilla. The major papilla was edematous.      The  bile duct could not be cannulated with the Hydratome sphincterotome       and revolution Jagtome sphincterotome. After an extended period of time       trying both in the short position and semilong position and long       position, it was felt that moving the excess tissue would be somewhat       helpful to try to cannulate. I used 2 hemoclips to try to pull the       ampulla downwards into a position that could be visualized. This was       partially successful. The bile duct could not be cannulated with the       revolution sphincterotome after extended amounts of time even using a       straight wire and a angled wire. Decision was made to attempt a  biliary       pre-cut sphincterotomy. I proceeded with this action and made a precut       sphincterotomy measuring 10 mm in length was made with a monofilament       needle knife using a freehand technique using ERBE electrocautery. There       was no post-sphincterotomy bleeding. There seem to be more bile draining       at this point in time. However, the bile duct could not be cannulated       with the revolution Jagtome sphincterotome using both the straight and       angled wires and bowing and manipulating the sphincterotome in different       positions as well as in long and short positions. After nearly 90       minutes, I was able to deflect an angled wire inferiorly and       subsequently the short 0.025 inch revolution angled Antonietta Breach was passed       into the ventral pancreatic duct. The ventral pancreatic duct was then       deeply cannulated with the Jagtome sphincterotome. Contrast was injected       (because the cholangiogram had suggested that there was a common orifice       between the bile duct and pancreas duct for a short area to try to see       if I could visualize the biliary tree). I personally interpreted the       pancreatic duct images. Ductal flow of contrast was adequate. Image       quality was adequate. Contrast extended to the pancreatic duct.       Opacification of the ventral pancreatic duct in the head of the pancreas       was successful. Unfortunately there was no biliary contrast that was       found to opacify. Repeated attempts at biliary cannulation were not       successful while using a wire-guided approach. Decision was made to       pursue a double-wire approach. The wire was left within the pancreatic       duct. The bile duct could not be cannulated unfortunately again with the       straight and angled wires. Feeling that I had done as much precut       sphincterotomy as I could safely, I decided to place one 4 Fr by 7 cm       temporary  plastic pancreatic stent with a single external pigtail was  placed into the ventral pancreatic duct. The stent was in good position.       The bile duct could not be cannulated with a second wire alongside the       pancreas duct stent.      After nearly 2 hours, I decided that there was nothing more that I could       do today. The duodenoscope was withdrawn from the patient. Impression:               - No gross lesions in the entire esophagus. Z-line                            irregular, 41 cm from the incisors.                           - Multiple gastric polyps. With one of the polyps                            having had recent bleeding. This polyp was                            resected. And a clip was placed.                           - Gastritis. Biopsied for HP evaluation                           - Acquired duodenal stenosis noted in the D1/D2                            angle/sweep.                           - The major papilla was located entirely within a                            diverticulum. The major papilla appeared edematous.                           - Very difficult attempt at cannulation as noted                            above. Precut sphincterotomy was pursued after                            placing clips to move the ampulla out of the                            diverticulum. Even this only eventually led to a                            ventral pancreatic duct cannulation.                           - One temporary plastic pancreatic stent was placed  into the ventral pancreatic duct to decrease post                            ERCP pancreatitis risk but also to attempt further                            biliary cannulation but was not successful at                            cannulation. Moderate Sedation:      Not Applicable - Patient had care per Anesthesia. Recommendation:           - The patient will be observed post-procedure,                             until all discharge criteria are met.                           - Discharge patient to home.                           - Patient has a contact number available for                            emergencies. The signs and symptoms of potential                            delayed complications were discussed with the                            patient. Return to normal activities tomorrow.                            Written discharge instructions were provided to the                            patient.                           - Low fat diet for at least 1 week.                           - Observe patient's clinical course.                           - Await path results.                           - Protonix 40 mg twice daily for 1 month. Then 40                            mg daily thereafter.                           - Carafate 1 g twice daily for 2 weeks.                           -  Recommend follow-up with Dr. Carlean Purl and repeat                            laboratories in the next 1 to 2 weeks to include a                            CMP and CBC.                           - We will discuss with his primary                            gastroenterologist, consideration of quaternary                            referral for 1 last attempt at ERCP versus if                            patient/family would consider PTBD with                            interventional radiology with subsequent ERCP                            rendezvous.                           - Patient will need a KUB 2-view in 10-14 days to                            ensure pancreatic stent has migrated successfully.                            If still present at that time will need to be                            scheduled for EGD with stent pull.                           - Watch for pancreatitis, bleeding, perforation,                            and cholangitis.                           - Restart  Coumadin tomorrow as per anticoagulation                            clinic.                           - The findings and recommendations were discussed                            with the patient.                           -  The findings and recommendations were discussed                            with the patient's family. Procedure Code(s):        --- Professional ---                           [phone], Endoscopic retrograde                            cholangiopancreatography (ERCP); with placement of                            endoscopic stent into biliary or pancreatic duct,                            including pre- and post-dilation and guide wire                            passage, when performed, including sphincterotomy,                            when performed, each stent                           43251, Esophagogastroduodenoscopy, flexible,                            transoral; with removal of tumor(s), polyp(s), or                            other lesion(s) by snare technique Diagnosis Code(s):        --- Professional ---                           K22.89, Other specified disease of esophagus                           K31.7, Polyp of stomach and duodenum                           K29.70, Gastritis, unspecified, without bleeding                           K31.5, Obstruction of duodenum                           K80.50, Calculus of bile duct without cholangitis                            or cholecystitis without obstruction                           R93.2, Abnormal findings on diagnostic imaging of                            liver and biliary  tract                           Z98.890, Other specified postprocedural states                           K83.8, Other specified diseases of biliary tract CPT copyright 2022 American Medical Association. All rights reserved. The codes documented in this report are preliminary and upon coder review may  be revised to meet current compliance  requirements. Justice Britain, MD 06/02/2022 4:48:07 PM Number of Addenda: 0

## 2022-06-02 NOTE — Assessment & Plan Note (Signed)
-  mildly elevated -will be receiving additional IV Lasix for respiratory issue -continue home antihypertensive

## 2022-06-02 NOTE — Consult Note (Incomplete)
NAME:  [name], MRN:  [id number], DOB:  11/18/1945, LOS: 0 ADMISSION DATE:  06/02/2022, CONSULTATION DATE:  12/4 REFERRING MD: Dr. Rush Landmark, CHIEF COMPLAINT:  Hypoxia    History of Present Illness:  76 y/o M who presented to [facility] on 12/4 for planned ERCP.   The patient was admitted in July 2023 with acute cholecystitis.        Pertinent  Medical History  Gastric Ulcer Diverticulosis  Cholecystitis  HTN HLD PAF  Chronic Diastolic Dysfunction / CHF  First Degree AV Block  Aortic Stenosis  CAD  ED   Significant Hospital Events: Including procedures, antibiotic start and stop dates in addition to other pertinent events   12/4 Admit for GI / Endo, post op atelectasis   Interim History / Subjective:  ***  Objective   Blood pressure (!) 141/91, pulse 83, temperature 97.6 F (36.4 C), temperature source Temporal, resp. rate 16, height '5\' 6"'$  (1.676 m), weight 75.3 kg, SpO2 90 %.        Intake/Output Summary (Last 24 hours) at 06/02/2022 1747 Last data filed at 06/02/2022 1617 Gross per 24 hour  Intake 1000 ml  Output --  Net 1000 ml   Filed Weights   06/02/22 1153  Weight: 75.3 kg    Examination: General: *** HENT: *** Lungs: *** Cardiovascular: *** Abdomen: *** Extremities: *** Neuro: ***   Resolved Hospital Problem list     Assessment & Plan:  ***  Best Practice (right click and "Reselect all SmartList Selections" daily)  Per [facility]   Labs   CBC: No results for input(s): "WBC", "NEUTROABS", "HGB", "HCT", "MCV", "PLT" in the last 168 hours.  Basic Metabolic Panel: No results for input(s): "NA", "K", "CL", "CO2", "GLUCOSE", "BUN", "CREATININE", "CALCIUM", "MG", "PHOS" in the last 168 hours. GFR: CrCl cannot be calculated (Patient's most recent lab result is older than the maximum 21 days allowed.). No results for input(s): "PROCALCITON", "WBC", "LATICACIDVEN" in the last 168 hours.  Liver Function Tests: No results for input(s):  "AST", "ALT", "ALKPHOS", "BILITOT", "PROT", "ALBUMIN" in the last 168 hours. No results for input(s): "LIPASE", "AMYLASE" in the last 168 hours. No results for input(s): "AMMONIA" in the last 168 hours.  ABG    Component Value Date/Time   PHART 7.342 (L) 04/21/2022 1516   PCO2ART 39.5 04/21/2022 1516   PO2ART 114 (H) 04/21/2022 1516   HCO3 21.4 04/21/2022 1516   TCO2 23 04/21/2022 1516   ACIDBASEDEF 4.0 (H) 04/21/2022 1516   O2SAT 98 04/21/2022 1516     Coagulation Profile: No results for input(s): "INR", "PROTIME" in the last 168 hours.  Cardiac Enzymes: No results for input(s): "CKTOTAL", "CKMB", "CKMBINDEX", "TROPONINI" in the last 168 hours.  HbA1C: Hemoglobin A1C  Date/Time Value Ref Range Status  07/29/2021 09:49 AM 5.7 (A) 4.0 - 5.6 % Final  03/12/2020 09:20 AM 5.6 4.0 - 5.6 % Final    CBG: No results for input(s): "GLUCAP" in the last 168 hours.  Review of Systems: Positives in [place]  Gen: Denies fever, chills, weight change, fatigue, night sweats HEENT: Denies blurred vision, double vision, hearing loss, tinnitus, sinus congestion, rhinorrhea, sore throat, neck stiffness, dysphagia PULM: Denies shortness of breath, cough, sputum production, hemoptysis, wheezing CV: Denies chest pain, edema, orthopnea, paroxysmal nocturnal dyspnea, palpitations GI: Denies abdominal pain, nausea, vomiting, diarrhea, hematochezia, melena, constipation, change in bowel habits GU: Denies dysuria, hematuria, polyuria, oliguria, urethral discharge Endocrine: Denies hot or cold intolerance, polyuria, polyphagia or appetite change Derm:  Denies rash, dry skin, scaling or peeling skin change Heme: Denies easy bruising, bleeding, bleeding gums Neuro: Denies headache, numbness, weakness, slurred speech, loss of memory or consciousness  Past Medical History:  He,  has a past medical history of Allergic rhinitis (08/08/2013), Aortic stenosis (09/08/2011), Cataract, CHF (congestive heart failure)  ([place]) (9147), Complication of anesthesia, Coronary artery disease (10/02/2009), Degenerative joint disease of shoulder (82/95/6213), Diastolic dysfunction (08/65/7846), Diverticulosis (10/07/2013), Erectile dysfunction (05/07/2009), Essential hypertension (08/03/2013), First degree AV block (03/14/2016), Gastric ulcer (10/07/2013), Hemorrhoids (10/07/2013), Hyperlipidemia (10/02/2009), Paroxysmal atrial fibrillation ([place]) (07/14/2006), Sciatica associated with disorder of lumbar spine (08/03/2013), Seborrheic keratosis (10/07/2013), and Tubular adenoma of colon.   Surgical History:   Past Surgical History:  Procedure Laterality Date   CARDIAC CATHETERIZATION  2007   CARDIOVERSION     CARDIOVERSION N/A 08/04/2019   Procedure: CARDIOVERSION;  Surgeon: Fay Records, MD;  Location: [facility] ENDOSCOPY;  Service: Cardiovascular;  Laterality: N/A;   CHOLECYSTECTOMY N/A 04/21/2022   Procedure: LAPAROSCOPIC CHOLECYSTECTOMY;  Surgeon: Jesusita Oka, MD;  Location: [place];  Service: General;  Laterality: N/A;   COLONOSCOPY     CYSTOSCOPY N/A 01/04/2022   Procedure: CYSTOSCOPY;  Surgeon: Raynelle Bring, MD;  Location: [place];  Service: Urology;  Laterality: N/A;   ERCP N/A 04/21/2022   Procedure: ATTEMPTED ENDOSCOPIC RETROGRADE CHOLANGIOPANCREATOGRAPHY (ERCP);  Surgeon: Gatha Mayer, MD;  Location: [place];  Service: Gastroenterology;  Laterality: N/A;   EYE SURGERY Bilateral 2022   bilateral cataracts   INSERTION OF MESH N/A 03/11/2019   Procedure: Insertion Of Mesh;  Surgeon: Ralene Ok, MD;  Location: [place];  Service: General;  Laterality: N/A;   INTRAOPERATIVE CHOLANGIOGRAM N/A 04/21/2022   Procedure: INTRAOPERATIVE CHOLANGIOGRAM;  Surgeon: Jesusita Oka, MD;  Location: [place];  Service: General;  Laterality: N/A;   IR EXCHANGE BILIARY DRAIN  02/14/2022   IR PERC CHOLECYSTOSTOMY  12/29/2021   JOINT REPLACEMENT     KNEE ARTHROSCOPY WITH MENISCAL REPAIR Right 01/30/2011   LAPAROTOMY N/A  01/04/2022   Procedure: EXPLORATORY LAPAROTOMY WITH EXTRAPERITONEAL BLADDER REPAIR;  Surgeon: Raynelle Bring, MD;  Location: [place];  Service: Urology;  Laterality: N/A;   right finger surgery     as a child   TONSILLECTOMY     TOTAL KNEE ARTHROPLASTY Left 07/28/2017   Procedure: LEFT TOTAL KNEE ARTHROPLASTY;  Surgeon: Renette Butters, MD;  Location: [place];  Service: Orthopedics;  Laterality: Left;   TOTAL SHOULDER ARTHROPLASTY  05/29/2011   Procedure: TOTAL SHOULDER ARTHROPLASTY;  Surgeon: Metta Clines Supple;  Location: [place];  Service: Orthopedics;  Laterality: Right;   TOTAL SHOULDER ARTHROPLASTY Left 06/08/2014   DR SUPPLE   TOTAL SHOULDER ARTHROPLASTY Left 06/08/2014   Procedure: LEFT TOTAL SHOULDER ARTHROPLASTY;  Surgeon: Marin Shutter, MD;  Location: [place];  Service: Orthopedics;  Laterality: Left;   UMBILICAL HERNIA REPAIR N/A 03/11/2019   Procedure: LAPAROSCOPIC UMBILICAL HERNIA;  Surgeon: Ralene Ok, MD;  Location: [place];  Service: General;  Laterality: N/A;     Social History:   reports that he quit smoking about 11 years ago. His smoking use included cigarettes. He started smoking about 53 years ago. He has a 63.00 pack-year smoking history. He has never used smokeless tobacco. He reports that he does not drink alcohol and does not use drugs.   Family History:  His family history includes Bradycardia (age of onset: 45) in his mother; Coronary artery disease (age of onset: 60) in his brother; Coronary artery disease (age  of onset: 67) in his father; Healthy in his son; Obesity in his daughter. There is no history of Colon cancer, Esophageal cancer, Stomach cancer, or Colon polyps.   Allergies No Known Allergies   Home Medications  Prior to Admission medications   Medication Sig Start Date End Date Taking? Authorizing Provider  acetaminophen (TYLENOL) 500 MG tablet Take 2 tablets (1,000 mg total) by mouth 4 (four) times daily. 04/23/22  Yes Jesusita Oka, MD   atorvastatin (LIPITOR) 40 MG tablet Take 1 tablet (40 mg total) by mouth daily. 01/20/22  Yes Lacinda Axon, MD  carvedilol (COREG) 6.25 MG tablet Take 1 tablet (6.25 mg total) by mouth 2 (two) times daily with a meal. 01/20/22  Yes Amponsah, Charisse March, MD  docusate sodium (COLACE) 100 MG capsule Take 1 capsule (100 mg total) by mouth 2 (two) times daily. 04/23/22  Yes Lovick, Montel Culver, MD  ePHEDrine HCl (PRIMATENE) 12.5 MG TABS Take 12.5 mg by mouth daily as needed (wheezing).   Yes [provider]  finasteride (PROSCAR) 5 MG tablet Take 1 tablet (5 mg total) by mouth daily. 01/21/22  Yes Lacinda Axon, MD  Fluticasone Propionate (GOODSENSE 24-HR ALLERGY NASAL NA) Place 1 spray into the nose daily as needed (allergies).   Yes [provider]  ibuprofen (ADVIL) 600 MG tablet Take 1 tablet (600 mg total) by mouth 4 (four) times daily. Patient taking differently: Take 600 mg by mouth every 6 (six) hours as needed for moderate pain. 04/23/22  Yes Jesusita Oka, MD  loratadine (CLARITIN) 10 MG tablet Take 10 mg by mouth daily as needed (wheezing).   Yes [provider]  losartan (COZAAR) 50 MG tablet TAKE 1 TABLET EVERY DAY 04/30/22  Yes Crenshaw, Denice Bors, MD  methocarbamol (ROBAXIN) 750 MG tablet Take 1 tablet (750 mg total) by mouth 4 (four) times daily. 04/23/22  Yes Jesusita Oka, MD  pantoprazole (PROTONIX) 40 MG tablet Take 1 tablet (40 mg total) by mouth 2 (two) times daily before a meal. 06/02/22 06/02/23 Yes Mansouraty, Telford Nab., MD  silodosin (RAPAFLO) 8 MG CAPS capsule Take 1 capsule (8 mg total) by mouth daily with breakfast. 02/27/22  Yes Axel Filler, MD  sucralfate (CARAFATE) 1 g tablet Take 1 tablet (1 g total) by mouth 2 (two) times daily for 14 days. 06/02/22 06/16/22 Yes Mansouraty, Telford Nab., MD  warfarin (COUMADIN) 5 MG tablet Take 0.5 tablets (2.5 mg total) by mouth daily. 06/03/22   Mansouraty, Telford Nab., MD     Critical care  time: ***

## 2022-06-02 NOTE — Assessment & Plan Note (Signed)
continue statin

## 2022-06-02 NOTE — Progress Notes (Signed)
Due to poor lung status, Patient moved from endo PACU to OR PACU. [facility] called to make aware and at bedside in PACU. New report given to PACU RN. Patient with spo2 of 94% on nonrebreather mask prior to CRNA leaving patient's care.

## 2022-06-02 NOTE — Consult Note (Addendum)
NAME:  [name], MRN:  [id number], DOB:  09/17/1945, LOS: 0 ADMISSION DATE:  06/02/2022, CONSULTATION DATE:  06/02/22 REFERRING MD:  Mansouraty CHIEF COMPLAINT: dyspnea   History of Present Illness:  46yM with PMH as listed below, choledocholithiasis who had recent admission for lap chole with stay in ICU overnight and extubation the following day. CBD was unable to be cleared intraoperatively and plan made for ERCP as outpatient. This was attempted today, challenging technically with sphincterotomy performed and pancreatic duct cannulation. He was extubated to nasal cannula in the endoscopy recovery area and then transferred to PACU with rising O2 requirement to 12L thru NRB, saturating in mid-90s. Lasix 20 mg IV given.   Pertinent  Medical History  CAD  AS  Grade I DD  First Degree AV Block  HTN  HLD  PAF - on coumadin Seasonal Allergies  Reduced diffusing capacity, borderline obstruction, air trapping on PFT 08/13/21  Significant Hospital Events: Including procedures, antibiotic start and stop dates in addition to other pertinent events   06/02/22 ERCP with sphincterotomy, pancreatic duct cannulation  Interim History / Subjective:  Drowsy but easily arousable, doesn't feel dyspneic. Weaned to 5L conventional O2 mask  Objective   Blood pressure (!) 160/95, pulse 81, temperature 97.6 F (36.4 C), temperature source Temporal, resp. rate 18, height '5\' 6"'$  (1.676 m), weight 75.3 kg, SpO2 95 %.        Intake/Output Summary (Last 24 hours) at 06/02/2022 1807 Last data filed at 06/02/2022 1617 Gross per 24 hour  Intake 1000 ml  Output --  Net 1000 ml   Filed Weights   06/02/22 1153  Weight: 75.3 kg    Examination: General appearance: 76 y.o., male, drowsy but easily arousable Eyes: PERRL, tracking appropriately HENT: NCAT; dry MM Neck: Trachea midline; no lymphadenopathy, no JVD Lungs: diminished bl, no wheeze/crackles, with normal respiratory effort CV: RRR, no murmur   Abdomen: Soft, non-tender; non-distended, BS present  Extremities: No peripheral edema, warm Neuro: grossly nonfocal, can attend to interview with encouragement   Resolved Hospital Problem list    Assessment & Plan:   # Acute hypoxic respiratory failure Seems to have atelectasis post op especially RLL which looks down completely. At baseline teeters on O2 requirement with moderately reduced diffusing capacity but without frank emphysema/ILD radiographically.  - ok for admission to stepdown - wean O2 for saturation 92% - would give another IV lasix 40 mg  - if not voiding spontaneously in next hour or so would check bladder scan - has had extraperitoneal bladder rupture/repair earlier this year - If increased work of breathing or if less responsive, check vbg, ok to trial NIV overnight  - bronchodilators reasonable to try given air trapping, borderline obstruction - Mobilize, chair position early tomorrow  # Acute metabolic encephalopathy Just waking up from anesthetic - continue to monitor, if failing to perk up would check vbg but seems to be waking up  # Choledocholithiasis - diet recs per GI  # AF  - home warfarin - coreg  # CAD - coreg - atorva  # Gastritis  - bid ppi - carafate  # history of urinary retention - rapaflow - proscar   Best Practice (right click and "Reselect all SmartList Selections" daily)   Per [facility]  Labs   CBC: No results for input(s): "WBC", "NEUTROABS", "HGB", "HCT", "MCV", "PLT" in the last 168 hours.  Basic Metabolic Panel: No results for input(s): "NA", "K", "CL", "CO2", "GLUCOSE", "BUN", "CREATININE", "CALCIUM", "MG", "PHOS"  in the last 168 hours. GFR: CrCl cannot be calculated (Patient's most recent lab result is older than the maximum 21 days allowed.). No results for input(s): "PROCALCITON", "WBC", "LATICACIDVEN" in the last 168 hours.  Liver Function Tests: No results for input(s): "AST", "ALT", "ALKPHOS", "BILITOT", "PROT",  "ALBUMIN" in the last 168 hours. No results for input(s): "LIPASE", "AMYLASE" in the last 168 hours. No results for input(s): "AMMONIA" in the last 168 hours.  ABG    Component Value Date/Time   PHART 7.342 (L) 04/21/2022 1516   PCO2ART 39.5 04/21/2022 1516   PO2ART 114 (H) 04/21/2022 1516   HCO3 21.4 04/21/2022 1516   TCO2 23 04/21/2022 1516   ACIDBASEDEF 4.0 (H) 04/21/2022 1516   O2SAT 98 04/21/2022 1516     Coagulation Profile: No results for input(s): "INR", "PROTIME" in the last 168 hours.  Cardiac Enzymes: No results for input(s): "CKTOTAL", "CKMB", "CKMBINDEX", "TROPONINI" in the last 168 hours.  HbA1C: Hemoglobin A1C  Date/Time Value Ref Range Status  07/29/2021 09:49 AM 5.7 (A) 4.0 - 5.6 % Final  03/12/2020 09:20 AM 5.6 4.0 - 5.6 % Final    CBG: No results for input(s): "GLUCAP" in the last 168 hours.  Review of Systems:   12 point review of systems is negative except as in HPI  Past Medical History:  He,  has a past medical history of Allergic rhinitis (08/08/2013), Aortic stenosis (09/08/2011), Cataract, CHF (congestive heart failure) ([place]) (2423), Complication of anesthesia, Coronary artery disease (10/02/2009), Degenerative joint disease of shoulder (53/61/4431), Diastolic dysfunction (54/00/8676), Diverticulosis (10/07/2013), Erectile dysfunction (05/07/2009), Essential hypertension (08/03/2013), First degree AV block (03/14/2016), Gastric ulcer (10/07/2013), Hemorrhoids (10/07/2013), Hyperlipidemia (10/02/2009), Paroxysmal atrial fibrillation ([place]) (07/14/2006), Sciatica associated with disorder of lumbar spine (08/03/2013), Seborrheic keratosis (10/07/2013), and Tubular adenoma of colon.   Surgical History:   Past Surgical History:  Procedure Laterality Date   CARDIAC CATHETERIZATION  2007   CARDIOVERSION     CARDIOVERSION N/A 08/04/2019   Procedure: CARDIOVERSION;  Surgeon: Fay Records, MD;  Location: [facility] ENDOSCOPY;  Service: Cardiovascular;  Laterality:  N/A;   CHOLECYSTECTOMY N/A 04/21/2022   Procedure: LAPAROSCOPIC CHOLECYSTECTOMY;  Surgeon: Jesusita Oka, MD;  Location: [place];  Service: General;  Laterality: N/A;   COLONOSCOPY     CYSTOSCOPY N/A 01/04/2022   Procedure: CYSTOSCOPY;  Surgeon: Raynelle Bring, MD;  Location: [place];  Service: Urology;  Laterality: N/A;   ERCP N/A 04/21/2022   Procedure: ATTEMPTED ENDOSCOPIC RETROGRADE CHOLANGIOPANCREATOGRAPHY (ERCP);  Surgeon: Gatha Mayer, MD;  Location: [place];  Service: Gastroenterology;  Laterality: N/A;   EYE SURGERY Bilateral 2022   bilateral cataracts   INSERTION OF MESH N/A 03/11/2019   Procedure: Insertion Of Mesh;  Surgeon: Ralene Ok, MD;  Location: [place];  Service: General;  Laterality: N/A;   INTRAOPERATIVE CHOLANGIOGRAM N/A 04/21/2022   Procedure: INTRAOPERATIVE CHOLANGIOGRAM;  Surgeon: Jesusita Oka, MD;  Location: [place];  Service: General;  Laterality: N/A;   IR EXCHANGE BILIARY DRAIN  02/14/2022   IR PERC CHOLECYSTOSTOMY  12/29/2021   JOINT REPLACEMENT     KNEE ARTHROSCOPY WITH MENISCAL REPAIR Right 01/30/2011   LAPAROTOMY N/A 01/04/2022   Procedure: EXPLORATORY LAPAROTOMY WITH EXTRAPERITONEAL BLADDER REPAIR;  Surgeon: Raynelle Bring, MD;  Location: [place];  Service: Urology;  Laterality: N/A;   right finger surgery     as a child   TONSILLECTOMY     TOTAL KNEE ARTHROPLASTY Left 07/28/2017   Procedure: LEFT TOTAL KNEE ARTHROPLASTY;  Surgeon: Edmonia Lynch  D, MD;  Location: [place];  Service: Orthopedics;  Laterality: Left;   TOTAL SHOULDER ARTHROPLASTY  05/29/2011   Procedure: TOTAL SHOULDER ARTHROPLASTY;  Surgeon: Metta Clines Supple;  Location: [place];  Service: Orthopedics;  Laterality: Right;   TOTAL SHOULDER ARTHROPLASTY Left 06/08/2014   DR SUPPLE   TOTAL SHOULDER ARTHROPLASTY Left 06/08/2014   Procedure: LEFT TOTAL SHOULDER ARTHROPLASTY;  Surgeon: Marin Shutter, MD;  Location: [place];  Service: Orthopedics;  Laterality: Left;   UMBILICAL HERNIA REPAIR N/A  03/11/2019   Procedure: LAPAROSCOPIC UMBILICAL HERNIA;  Surgeon: Ralene Ok, MD;  Location: [place];  Service: General;  Laterality: N/A;     Social History:   reports that he quit smoking about 11 years ago. His smoking use included cigarettes. He started smoking about 53 years ago. He has a 63.00 pack-year smoking history. He has never used smokeless tobacco. He reports that he does not drink alcohol and does not use drugs.   Family History:  His family history includes Bradycardia (age of onset: 56) in his mother; Coronary artery disease (age of onset: 93) in his brother; Coronary artery disease (age of onset: 47) in his father; Healthy in his son; Obesity in his daughter. There is no history of Colon cancer, Esophageal cancer, Stomach cancer, or Colon polyps.   Allergies No Known Allergies   Home Medications  Prior to Admission medications   Medication Sig Start Date End Date Taking? Authorizing Provider  acetaminophen (TYLENOL) 500 MG tablet Take 2 tablets (1,000 mg total) by mouth 4 (four) times daily. 04/23/22  Yes Jesusita Oka, MD  atorvastatin (LIPITOR) 40 MG tablet Take 1 tablet (40 mg total) by mouth daily. 01/20/22  Yes Lacinda Axon, MD  carvedilol (COREG) 6.25 MG tablet Take 1 tablet (6.25 mg total) by mouth 2 (two) times daily with a meal. 01/20/22  Yes Amponsah, Charisse March, MD  docusate sodium (COLACE) 100 MG capsule Take 1 capsule (100 mg total) by mouth 2 (two) times daily. 04/23/22  Yes Lovick, Montel Culver, MD  ePHEDrine HCl (PRIMATENE) 12.5 MG TABS Take 12.5 mg by mouth daily as needed (wheezing).   Yes [provider]  finasteride (PROSCAR) 5 MG tablet Take 1 tablet (5 mg total) by mouth daily. 01/21/22  Yes Lacinda Axon, MD  Fluticasone Propionate (GOODSENSE 24-HR ALLERGY NASAL NA) Place 1 spray into the nose daily as needed (allergies).   Yes [provider]  ibuprofen (ADVIL) 600 MG tablet Take 1 tablet (600 mg total) by mouth 4 (four)  times daily. Patient taking differently: Take 600 mg by mouth every 6 (six) hours as needed for moderate pain. 04/23/22  Yes Jesusita Oka, MD  loratadine (CLARITIN) 10 MG tablet Take 10 mg by mouth daily as needed (wheezing).   Yes [provider]  losartan (COZAAR) 50 MG tablet TAKE 1 TABLET EVERY DAY 04/30/22  Yes Crenshaw, Denice Bors, MD  methocarbamol (ROBAXIN) 750 MG tablet Take 1 tablet (750 mg total) by mouth 4 (four) times daily. 04/23/22  Yes Jesusita Oka, MD  pantoprazole (PROTONIX) 40 MG tablet Take 1 tablet (40 mg total) by mouth 2 (two) times daily before a meal. 06/02/22 06/02/23 Yes Mansouraty, Telford Nab., MD  silodosin (RAPAFLO) 8 MG CAPS capsule Take 1 capsule (8 mg total) by mouth daily with breakfast. 02/27/22  Yes Axel Filler, MD  sucralfate (CARAFATE) 1 g tablet Take 1 tablet (1 g total) by mouth 2 (two) times daily for 14 days.  06/02/22 06/16/22 Yes Mansouraty, Telford Nab., MD  warfarin (COUMADIN) 5 MG tablet Take 0.5 tablets (2.5 mg total) by mouth daily. 06/03/22   Mansouraty, Telford Nab., MD     Critical care time: n/a

## 2022-06-02 NOTE — Assessment & Plan Note (Addendum)
Complicated GI course with cholecystitis in July requiring percutaneous cholecystostomy tube, subsequent cholecystectomy and failed ERCP despite abnormal IOP. -Repeat ERCP attempt today subsequently requiring sphincterotomy and pancreatic stent placement -Per GI, if patient has obstructive LFT pattern would need to pursue IR PTBD versus repeat ERCP at [facility] to continue to follow  -recommends clear liquid diet post-op and advance diet as tolerated -recommends resuming warfarin for a.fib tomorrow -continue PPI BID and carafate 1g BID

## 2022-06-03 ENCOUNTER — Telehealth: Payer: Self-pay

## 2022-06-03 DIAGNOSIS — K317 Polyp of stomach and duodenum: Secondary | ICD-10-CM

## 2022-06-03 DIAGNOSIS — I25119 Atherosclerotic heart disease of native coronary artery with unspecified angina pectoris: Secondary | ICD-10-CM | POA: Diagnosis not present

## 2022-06-03 DIAGNOSIS — K2970 Gastritis, unspecified, without bleeding: Secondary | ICD-10-CM | POA: Diagnosis not present

## 2022-06-03 DIAGNOSIS — K8050 Calculus of bile duct without cholangitis or cholecystitis without obstruction: Secondary | ICD-10-CM | POA: Diagnosis not present

## 2022-06-03 DIAGNOSIS — Z9889 Other specified postprocedural states: Secondary | ICD-10-CM | POA: Diagnosis not present

## 2022-06-03 DIAGNOSIS — J9601 Acute respiratory failure with hypoxia: Secondary | ICD-10-CM | POA: Diagnosis not present

## 2022-06-03 DIAGNOSIS — K831 Obstruction of bile duct: Secondary | ICD-10-CM | POA: Diagnosis not present

## 2022-06-03 DIAGNOSIS — R338 Other retention of urine: Secondary | ICD-10-CM | POA: Diagnosis not present

## 2022-06-03 DIAGNOSIS — Z9049 Acquired absence of other specified parts of digestive tract: Secondary | ICD-10-CM | POA: Diagnosis not present

## 2022-06-03 DIAGNOSIS — K838 Other specified diseases of biliary tract: Secondary | ICD-10-CM | POA: Diagnosis not present

## 2022-06-03 DIAGNOSIS — R932 Abnormal findings on diagnostic imaging of liver and biliary tract: Secondary | ICD-10-CM | POA: Diagnosis not present

## 2022-06-03 DIAGNOSIS — K315 Obstruction of duodenum: Secondary | ICD-10-CM | POA: Diagnosis not present

## 2022-06-03 DIAGNOSIS — K2289 Other specified disease of esophagus: Secondary | ICD-10-CM | POA: Diagnosis not present

## 2022-06-03 DIAGNOSIS — E7800 Pure hypercholesterolemia, unspecified: Secondary | ICD-10-CM | POA: Diagnosis not present

## 2022-06-03 LAB — COMPREHENSIVE METABOLIC PANEL
ALT: 16 U/L (ref 0–44)
ALT: 15 U/L (ref 0–44)
AST: 30 U/L (ref 15–41)
AST: 29 U/L (ref 15–41)
Albumin: 3.3 g/dL — ABNORMAL LOW (ref 3.5–5.0)
Albumin: 3.2 g/dL — ABNORMAL LOW (ref 3.5–5.0)
Alkaline Phosphatase: 91 U/L (ref 38–126)
Alkaline Phosphatase: 87 U/L (ref 38–126)
Anion gap: 10 (ref 5–15)
Anion gap: 12 (ref 5–15)
BUN: 21 mg/dL (ref 8–23)
BUN: 21 mg/dL (ref 8–23)
CO2: 26 mmol/L (ref 22–32)
CO2: 23 mmol/L (ref 22–32)
Calcium: 8.4 mg/dL — ABNORMAL LOW (ref 8.9–10.3)
Calcium: 8.3 mg/dL — ABNORMAL LOW (ref 8.9–10.3)
Chloride: 106 mmol/L (ref 98–111)
Chloride: 106 mmol/L (ref 98–111)
Creatinine, Ser: 1.11 mg/dL (ref 0.61–1.24)
Creatinine, Ser: 1.05 mg/dL (ref 0.61–1.24)
GFR, Estimated: >60 mL/min (ref >60)
GFR, Estimated: >60 mL/min (ref >60)
Glucose, Bld: 128 mg/dL — ABNORMAL HIGH (ref 70–99)
Glucose, Bld: 122 mg/dL — ABNORMAL HIGH (ref 70–99)
Potassium: 3.6 mmol/L (ref 3.5–5.1)
Potassium: 3.3 mmol/L — ABNORMAL LOW (ref 3.5–5.1)
Sodium: 142 mmol/L (ref 135–145)
Sodium: 141 mmol/L (ref 135–145)
Total Bilirubin: 1.3 mg/dL — ABNORMAL HIGH (ref 0.3–1.2)
Total Bilirubin: 1.3 mg/dL — ABNORMAL HIGH (ref 0.3–1.2)
Total Protein: 6.9 g/dL (ref 6.5–8.1)
Total Protein: 6.7 g/dL (ref 6.5–8.1)

## 2022-06-03 LAB — CBC
HCT: 39.2 % (ref 39.0–52.0)
HCT: 38.3 % — ABNORMAL LOW (ref 39.0–52.0)
Hemoglobin: 12.1 g/dL — ABNORMAL LOW (ref 13.0–17.0)
Hemoglobin: 11.7 g/dL — ABNORMAL LOW (ref 13.0–17.0)
MCH: 30.5 pg (ref 26.0–34.0)
MCH: 29.8 pg (ref 26.0–34.0)
MCHC: 30.9 g/dL (ref 30.0–36.0)
MCHC: 30.5 g/dL (ref 30.0–36.0)
MCV: 98.7 fL (ref 80.0–100.0)
MCV: 97.5 fL (ref 80.0–100.0)
Platelets: 153 10*3/uL (ref 150–400)
Platelets: 145 10*3/uL — ABNORMAL LOW (ref 150–400)
RBC: 3.97 MIL/uL — ABNORMAL LOW (ref 4.22–5.81)
RBC: 3.93 MIL/uL — ABNORMAL LOW (ref 4.22–5.81)
RDW: 15.9 % — ABNORMAL HIGH (ref 11.5–15.5)
RDW: 15.9 % — ABNORMAL HIGH (ref 11.5–15.5)
WBC: 5.3 10*3/uL (ref 4.0–10.5)
WBC: 4.9 10*3/uL (ref 4.0–10.5)
nRBC: 0 % (ref 0.0–0.2)
nRBC: 0 % (ref 0.0–0.2)

## 2022-06-03 LAB — PROTIME-INR
INR: 1.6 — ABNORMAL HIGH (ref 0.8–1.2)
Prothrombin Time: 18.7 seconds — ABNORMAL HIGH (ref 11.4–15.2)

## 2022-06-03 LAB — MRSA NEXT GEN BY PCR, NASAL: MRSA by PCR Next Gen: NOT DETECTED

## 2022-06-03 MED ORDER — DOCUSATE SODIUM 100 MG PO CAPS
100.0000 mg | ORAL_CAPSULE | Freq: Two times a day (BID) | ORAL | Status: DC
  Administered 2022-06-03 (×2): 100 mg via ORAL
  Filled 2022-06-03 (×2): qty 1

## 2022-06-03 MED ORDER — PANTOPRAZOLE SODIUM 40 MG PO TBEC: 40.0000 mg | DELAYED_RELEASE_TABLET | Freq: Every day | ORAL | Status: DC

## 2022-06-03 MED ORDER — IBUPROFEN 200 MG PO TABS: 600.0000 mg | ORAL_TABLET | Freq: Four times a day (QID) | ORAL | Status: DC | PRN

## 2022-06-03 MED ORDER — ALBUTEROL SULFATE HFA 108 (90 BASE) MCG/ACT IN AERS: 2.0000 | INHALATION_SPRAY | Freq: Four times a day (QID) | RESPIRATORY_TRACT | 2 refills | Status: DC | PRN

## 2022-06-03 MED ORDER — LOSARTAN POTASSIUM 50 MG PO TABS
50.0000 mg | ORAL_TABLET | Freq: Every day | ORAL | Status: DC
  Administered 2022-06-03: 50 mg via ORAL
  Filled 2022-06-03: qty 1

## 2022-06-03 MED ORDER — ATORVASTATIN CALCIUM 40 MG PO TABS
40.0000 mg | ORAL_TABLET | Freq: Every day | ORAL | Status: DC
  Administered 2022-06-03: 40 mg via ORAL
  Filled 2022-06-03: qty 1

## 2022-06-03 MED ORDER — WARFARIN - PHARMACIST DOSING INPATIENT: Freq: Every day | Status: DC

## 2022-06-03 MED ORDER — PREDNISONE 20 MG PO TABS
40.0000 mg | ORAL_TABLET | Freq: Every day | ORAL | Status: DC
  Administered 2022-06-03: 40 mg via ORAL
  Filled 2022-06-03: qty 2

## 2022-06-03 MED ORDER — HYDRALAZINE HCL 20 MG/ML IJ SOLN: 10.0000 mg | Freq: Four times a day (QID) | INTRAMUSCULAR | Status: DC | PRN

## 2022-06-03 MED ORDER — WARFARIN SODIUM 5 MG PO TABS
5.0000 mg | ORAL_TABLET | Freq: Once | ORAL | Status: AC
  Administered 2022-06-03: 5 mg via ORAL
  Filled 2022-06-03: qty 1

## 2022-06-03 MED ORDER — HYDRALAZINE HCL 20 MG/ML IJ SOLN
10.0000 mg | Freq: Four times a day (QID) | INTRAMUSCULAR | Status: DC | PRN
  Administered 2022-06-03: 10 mg via INTRAVENOUS
  Filled 2022-06-03: qty 1

## 2022-06-03 MED ORDER — CARVEDILOL 6.25 MG PO TABS
6.2500 mg | ORAL_TABLET | Freq: Two times a day (BID) | ORAL | Status: DC
  Administered 2022-06-03: 6.25 mg via ORAL
  Filled 2022-06-03: qty 1

## 2022-06-03 MED ORDER — PREDNISONE 20 MG PO TABS: 40.0000 mg | ORAL_TABLET | Freq: Every day | ORAL | 0 refills | Status: AC

## 2022-06-03 MED ORDER — PANTOPRAZOLE SODIUM 40 MG PO TBEC
40.0000 mg | DELAYED_RELEASE_TABLET | Freq: Two times a day (BID) | ORAL | Status: DC
  Administered 2022-06-03: 40 mg via ORAL
  Filled 2022-06-03: qty 1

## 2022-06-03 MED ORDER — LORATADINE 10 MG PO TABS
10.0000 mg | ORAL_TABLET | Freq: Every day | ORAL | Status: DC
  Administered 2022-06-03: 10 mg via ORAL
  Filled 2022-06-03: qty 1

## 2022-06-03 MED ORDER — IPRATROPIUM-ALBUTEROL 0.5-2.5 (3) MG/3ML IN SOLN
3.0000 mL | Freq: Four times a day (QID) | RESPIRATORY_TRACT | Status: DC
  Administered 2022-06-03 (×2): 3 mL via RESPIRATORY_TRACT
  Filled 2022-06-03 (×2): qty 3

## 2022-06-03 NOTE — Progress Notes (Signed)
06/03/2022   I have seen and evaluated the patient for hypoxemia postop.  S:  Rapidly improving.  Needs an IS. O2 needs down.  O: Blood pressure (!) 131/95, pulse 88, temperature 97.8 F (36.6 C), temperature source Axillary, resp. rate 20, height '5\' 6"'$  (1.676 m), weight 74.6 kg, SpO2 93 %.  No distress Lungs clear Moves all 4 ext Aox3  A:  Postop hypoxemia related to de-recruitment resolving.  P:  Encourage IS, mobility Available PRN  Erskine Emery MD [facility] Prefer epic messenger for cross cover needs If after hours, please call E-link

## 2022-06-03 NOTE — Discharge Summary (Signed)
Physician Discharge Summary   Patient: [name] MRN: [id number] DOB: [birth year]  Admit date:     06/02/2022  Discharge date: 06/03/22  Discharge Physician: Estill Cotta, MD    PCP: Axel Filler, MD   Recommendations at discharge:   Patient meets criteria for home O2 2 L.   SATURATION QUALIFICATIONS:   Patient Saturations on Room Air at Rest = 94 %   Patient Saturations on Room Air while Ambulating = 85 %   Patient Saturations on 2 Liters of oxygen while Ambulating = 90%   Please briefly explain why patient needs home oxygen: Patient needs home oxygen due to increased oxygen demands while ambulating.  Severe COPD/emphysema, hypoxia on ambulation requiring home O2            Discharge Diagnoses:    Acute respiratory failure with hypoxia ([place])    Choledocholithiasis   Acute urinary retention   Hyperlipidemia   CAD (coronary artery disease)   Essential hypertension   Atrial fibrillation (HCC)   Status post cholecystectomy   Choledocholithiasis   Hospital Course:  [name] is a 76 y.o. male with medical history significant of atrial fibrillation on Coumadin, HTN, emphysema, CAD, HLD, pre-diabetes, choledocholithiasis and bladder repair s/p repair who we are being consulted by [facility] Dr. Rush Landmark for acute hypoxic respiratory failure following ERCP.    Pt has had a complicated GI course in the past few months regarding his gallbladder. In July 2023, he presented with septic shock secondary to acute cholecystitis, non-obstructing choledocholithiasis. He was too ill to tolerate surgery and was discharged with percutaneous chole tube. Hospitalization at that time also complicated by bladder rupture s/p repair with urology. He subsequently followed up in October and had lap chole and ERCP. He required 24 hrs intubation post-op due to respiratory distress. Despite ERCP and IOC, either general surgery nor GI was able to clear stones in his CBP. He  presented today to have repeat ERCP subsequently requiring biliary cannulation and pancreatic duct stent.    Post-operatively had increasing oxygen requirement. CXR with bilateral opacity concerning for effusion vs atelectasis. He was based on non-rebreather 15L with O2 93-94% but was progressively able to wean down to 5-7L simple mask following '20mg'$  of IV Lasix.PCCM was consulted by GI and was evaluated by Dr. Verlee Monte who felt hypoxic seems to be related to post-op atelectasis  Patient was admitted for observation  Assessment and Plan: * Acute respiratory failure with hypoxia ([place]) -Acute post-op hypoxia with CXR revealing for bilateral pleural effusion and atelectasis  -Initially required 15L NRB now able to wean down to 5L via [place] after receiving Lasix 20 mg IV x1 and subsequently another 40 mg IV x1 -PCCM was consulted given history of tenuous respiratory status with underlying emphysema -Overnight patient did well, has diuresed well, O2 requirements low, on weaning off of O2, sats dropped down to 85 to 88% -Home evaluation done, qualifies for 2 L home O2 -Continue albuterol inhaler and prednisone 40 mg daily for 5 days  Acute urinary retention -unable to void despite IV '20mg'$  Lasix. Likely post-op atony.  -bladder scan ordered give hx of extraperitoneal bladder rupture early this year- scan showed 583m -Received in and out cath. Pt has hx of requiring urethral meatus dilation with urology in the past. -continue rapaflow and proscar  Choledocholithiasis Complicated GI course with cholecystitis in July requiring percutaneous cholecystostomy tube, subsequent cholecystectomy and failed ERCP despite abnormal IOP. -ERCP was attempted on 12/4 at biliary cannulation  in the setting of positive MRI/MRCP and positive IOC however failed biliary cannulation, PD stent was left in place. -Per GI, would need to pursue IR PTBD versus repeat [facility] at [facility].  -Recommended Protonix 40 mg twice  daily for 4 weeks, then daily thereafter, Carafate for 2 weeks.  Atrial fibrillation (HCC) -rate controlled -continue beta-blocker -Warfarin resumed  Essential hypertension -Resumed outpatient and antihypertensive regimen  CAD (coronary artery disease) -Resumed Coumadin -continue beta-blocker, statin  Hyperlipidemia -continue statin  Patient's wife at the bedside, in agreement with the plan.  Patient hoping to go home today, cleared from GI standpoint.  Doing better with respiratory status, O2 sats 92% on 2 L      Pain control - [place] Controlled Substance Reporting System database was reviewed. and patient was instructed, not to drive, operate heavy machinery, perform activities at heights, swimming or participation in water activities or provide baby-sitting services while on Pain, Sleep and Anxiety Medications; until their outpatient Physician has advised to do so again. Also recommended to not to take more than prescribed Pain, Sleep and Anxiety Medications.  Consultants: Pulmonary critical care, GI Procedures performed: Elective ERCP was attempted on 12/4 by GI Disposition: Home Diet recommendation:  Discharge Diet Orders (From admission, onward)     Start     Ordered   06/03/22 0000  Diet - low sodium heart healthy        06/03/22 1226         DISCHARGE MEDICATION: Allergies as of 06/03/2022   No Known Allergies      Medication List     TAKE these medications    Acetaminophen Extra Strength 500 MG Tabs Take 2 tablets (1,000 mg total) by mouth 4 (four) times daily.   albuterol 108 (90 Base) MCG/ACT inhaler Commonly known as: VENTOLIN HFA Inhale 2 puffs into the lungs every 6 (six) hours as needed for wheezing or shortness of breath.   atorvastatin 40 MG tablet Commonly known as: LIPITOR Take 1 tablet (40 mg total) by mouth daily.   carvedilol 6.25 MG tablet Commonly known as: COREG Take 1 tablet (6.25 mg total) by mouth 2 (two) times daily with  a meal.   docusate sodium 100 MG capsule Commonly known as: COLACE Take 1 capsule (100 mg total) by mouth 2 (two) times daily.   finasteride 5 MG tablet Commonly known as: PROSCAR Take 1 tablet (5 mg total) by mouth daily.   GOODSENSE 24-HR ALLERGY NASAL NA Place 1 spray into the nose daily as needed (allergies).   ibuprofen 600 MG tablet Commonly known as: ADVIL Take 1 tablet (600 mg total) by mouth 4 (four) times daily. What changed:  when to take this reasons to take this   loratadine 10 MG tablet Commonly known as: CLARITIN Take 10 mg by mouth daily as needed (wheezing).   losartan 50 MG tablet Commonly known as: COZAAR TAKE 1 TABLET EVERY DAY   methocarbamol 750 MG tablet Commonly known as: ROBAXIN Take 1 tablet (750 mg total) by mouth 4 (four) times daily.   pantoprazole 40 MG tablet Commonly known as: Protonix Take 1 tablet (40 mg total) by mouth 2 (two) times daily before a meal.   predniSONE 20 MG tablet Commonly known as: DELTASONE Take 2 tablets (40 mg total) by mouth daily before breakfast for 5 days. Start taking on: June 04, 2022   Primatene 12.5 MG Tabs Generic drug: ePHEDrine HCl Take 12.5 mg by mouth daily as needed (wheezing).  silodosin 8 MG Caps capsule Commonly known as: RAPAFLO Take 1 capsule (8 mg total) by mouth daily with breakfast.   sucralfate 1 g tablet Commonly known as: Carafate Take 1 tablet (1 g total) by mouth 2 (two) times daily for 14 days.   warfarin 5 MG tablet Commonly known as: COUMADIN Take as directed. If you are unsure how to take this medication, talk to your nurse or doctor. Original instructions: Take 0.5 tablets (2.5 mg total) by mouth daily.               Durable Medical Equipment  (From admission, onward)           Start     Ordered   06/03/22 1225  For home use only DME oxygen  Once       Question Answer Comment  Length of Need 6 Months   Mode or (Route) Nasal cannula   Liters per Minute  2   Frequency Continuous (stationary and portable oxygen unit needed)   Oxygen conserving device Yes   Oxygen delivery system Gas      06/03/22 1224            Follow-up Information     Axel Filler, MD. Schedule an appointment as soon as possible for a visit in 2 week(s).   Specialty: Internal Medicine Why: for hospital follow-up Contact information: [place] [address] [phone]         Mansouraty, Telford Nab., MD. Schedule an appointment as soon as possible for a visit in 2 week(s).   Specialties: Gastroenterology, Internal Medicine Why: for hospital follow-up, obtain labs Contact information: [place] [address] [phone]                Discharge Exam: Danley Danker Weights   06/02/22 1153 06/02/22 2340  Weight: 75.3 kg 74.6 kg   S: Weaned successfully down to 2 L this morning, then off.  No worsening shortness of breath, chest pain Per wife at the bedside he did have wheezing yesterday. No coughing, fevers or chills.  BP (!) 131/95   Pulse 88   Temp 97.8 F (36.6 C) (Axillary)   Resp 20   Ht '5\' 6"'$  (1.676 m)   Wt 74.6 kg   SpO2 93%   BMI 26.55 kg/m    Physical Exam General: Alert and oriented x 3, NAD Cardiovascular: S1 S2 clear, RRR.  Respiratory: Mild scattered wheezing bilaterally, improving Gastrointestinal: Soft, nontender, nondistended, NBS Ext: no pedal edema bilaterally Neuro: no new deficits Psych: Normal affect and demeanor, alert and oriented x3   Condition at discharge: fair  The results of significant diagnostics from this hospitalization (including imaging, microbiology, ancillary and laboratory) are listed below for reference.   Imaging Studies: DG Chest Port 1 View  Result Date: 06/02/2022 CLINICAL DATA:  Shortness of breath EXAM: PORTABLE CHEST 1 VIEW COMPARISON:  04/24/2022 FINDINGS: Transverse diameter of heart is increased. There is increased opacity in both lower lung  fields. There is no pneumothorax. There is previous arthroplasty in both shoulders. Dense calcifications are seen in thoracic aorta. IMPRESSION: Cardiomegaly. Increased density in both lower lung fields may suggest increase in bilateral pleural effusions and possibly underlying atelectasis/pneumonia. Electronically Signed   By: Elmer Picker M.D.   On: 06/02/2022 17:00   DG ERCP  Result Date: 06/02/2022 CLINICAL DATA:  Choledocholithiasis EXAM: ERCP COMPARISON:  Intraoperative cholangiogram, 04/21/2022. IR fluoroscopy, 02/14/2022. FLUOROSCOPY: Exposure Index (as provided by the  fluoroscopic device): 76.7 mGy Kerma FINDINGS: Multiple, limited oblique planar images of the RIGHT upper quadrant obtained C-arm. Images demonstrating flexible endoscopy, pancreatic duct cannulation and plastic stent placement. IMPRESSION: Fluoroscopic imaging for ERCP and pancreatic duct stent placement. For complete description of intra procedural findings, please see performing service dictation. Electronically Signed   By: Michaelle Birks M.D.   On: 06/02/2022 16:27   DG C-Arm 1-60 Min-No Report  Result Date: 06/02/2022 Fluoroscopy was utilized by the requesting physician.  No radiographic interpretation.   DG C-Arm 1-60 Min-No Report  Result Date: 06/02/2022 Fluoroscopy was utilized by the requesting physician.  No radiographic interpretation.    Microbiology: Results for orders placed or performed during the hospital encounter of 06/02/22  MRSA Next Gen by PCR, Nasal     Status: None   Collection Time: 06/02/22 10:57 PM   Specimen: Nasal Mucosa; Nasal Swab  Result Value Ref Range Status   MRSA by PCR Next Gen NOT DETECTED NOT DETECTED Final    Comment: (NOTE) The GeneXpert MRSA Assay (FDA approved for NASAL specimens only), is one component of a comprehensive MRSA colonization surveillance program. It is not intended to diagnose MRSA infection nor to guide or monitor treatment for MRSA infections. Test  performance is not FDA approved in patients less than 84 years old. Performed at [facility], [place] [address], [place], [place] [address]     Labs: CBC: Recent Labs  Lab 06/03/22 0036 06/03/22 0328  WBC 5.3 4.9  HGB 12.1* 11.7*  HCT 39.2 38.3*  MCV 98.7 97.5  PLT 153 536*   Basic Metabolic Panel: Recent Labs  Lab 06/03/22 0036 06/03/22 0328  NA 142 141  K 3.6 3.3*  CL 106 106  CO2 26 23  GLUCOSE 128* 122*  BUN 21 21  CREATININE 1.11 1.05  CALCIUM 8.4* 8.3*   Liver Function Tests: Recent Labs  Lab 06/03/22 0036 06/03/22 0328  AST 30 29  ALT 16 15  ALKPHOS 91 87  BILITOT 1.3* 1.3*  PROT 6.9 6.7  ALBUMIN 3.3* 3.2*   CBG: No results for input(s): "GLUCAP" in the last 168 hours.  Discharge time spent: greater than 30 minutes.  Signed: Estill Cotta, MD [facility] 06/03/2022

## 2022-06-03 NOTE — TOC Progression Note (Signed)
Transition of Care [facility]) - Progression Note    Patient Details  Name: [name] MRN: [id number] Date of Birth: [birth year]  Transition of Care [facility]) CM/SW Contact  Purcell Mouton, RN Phone Number: 06/03/2022, 1:35 PM  Clinical Narrative:     Pt selected [employer] for home O2 Referred given to [facility] with [employer]..   Expected Discharge Plan: Home/Self Care Barriers to Discharge: No Barriers Identified  Expected Discharge Plan and Services Expected Discharge Plan: Home/Self Care         Expected Discharge Date: 06/03/22                                     Social Determinants of Health (SDOH) Interventions    Readmission Risk Interventions     No data to display

## 2022-06-03 NOTE — Progress Notes (Signed)
SATURATION QUALIFICATIONS:  Patient Saturations on Room Air at Rest = 94 %  Patient Saturations on Room Air while Ambulating = 85 %  Patient Saturations on 2 Liters of oxygen while Ambulating = 90%  Please briefly explain why patient needs home oxygen: Patient needs home oxygen due to increased oxygen demands while ambulating.

## 2022-06-03 NOTE — Progress Notes (Signed)
ANTICOAGULATION CONSULT NOTE - Initial Consult  Pharmacy Consult for Warfarin  Indication: atrial fibrillation  No Known Allergies  Patient Measurements: Height: '5\' 6"'$  (167.6 cm) Weight: 74.6 kg (164 lb 7.4 oz) IBW/kg (Calculated) : 63.8  Vital Signs: Temp: 97.8 F (36.6 C) (12/05 0800) Temp Source: Axillary (12/05 0800) BP: 131/95 (12/05 0800) Pulse Rate: 88 (12/05 0800)  Labs: Recent Labs    06/03/22 0036 06/03/22 0328  HGB 12.1* 11.7*  HCT 39.2 38.3*  PLT 153 145*  LABPROT  --  18.7*  INR  --  1.6*  CREATININE 1.11 1.05    Estimated Creatinine Clearance: 55.7 mL/min (by C-G formula based on SCr of 1.05 mg/dL).   Medical History: Past Medical History:  Diagnosis Date   Allergic rhinitis 08/08/2013   takes Loratadine daily    Aortic stenosis 09/08/2011   With mild aortic regurgitation, mean gradient 13 mmHg    Cataract    right but immature   CHF (congestive heart failure) ([place]) 2778   Complication of anesthesia    stopped breathing - during shoulder surgery 2009-2010   Coronary artery disease 10/02/2009   Cardiac cath (October 2007): 20% left main, 60% mid LAD, 60-70% distal LAD, 30-40% first diagonal, 30% circumflex, 40% OM, 30-40% RCA    Degenerative joint disease of shoulder 08/03/2013   Bilateral, s/p right total shoulder arthroplasty 05/29/2011 and left shoulder arthroplasty 24/23/5361   Diastolic dysfunction 44/31/5400   Echo (04/04/2016): Grade I   Diverticulosis 10/07/2013   Seen on colonoscopy in 2007    Erectile dysfunction 05/07/2009   Essential hypertension 08/03/2013   had been on Lisinopril but stopped per MD   First degree AV block 03/14/2016   Gastric ulcer 10/07/2013   Seen on EGD 04/10/2006    Hemorrhoids 10/07/2013   Hyperlipidemia 10/02/2009   Paroxysmal atrial fibrillation ([place]) 07/14/2006   One episode, provoked by alcohol use disorder, briefly anticoagulated with warfarin, then switched to aspirin alone   Sciatica associated  with disorder of lumbar spine 08/03/2013   Anterolisthesis with right L 4-5 nerve root compression.  Treated with epidural injections and Physical Therapy.    Seborrheic keratosis 10/07/2013   Tubular adenoma of colon     Medications:  Medications Prior to Admission  Medication Sig Dispense Refill Last Dose   acetaminophen (TYLENOL) 500 MG tablet Take 2 tablets (1,000 mg total) by mouth 4 (four) times daily. 120 tablet 3 Past Week   atorvastatin (LIPITOR) 40 MG tablet Take 1 tablet (40 mg total) by mouth daily. 30 tablet 5 06/02/2022   carvedilol (COREG) 6.25 MG tablet Take 1 tablet (6.25 mg total) by mouth 2 (two) times daily with a meal. 30 tablet 5    docusate sodium (COLACE) 100 MG capsule Take 1 capsule (100 mg total) by mouth 2 (two) times daily. 60 capsule 1 06/02/2022   ePHEDrine HCl (PRIMATENE) 12.5 MG TABS Take 12.5 mg by mouth daily as needed (wheezing).   06/01/2022   finasteride (PROSCAR) 5 MG tablet Take 1 tablet (5 mg total) by mouth daily. 30 tablet 5 06/02/2022   Fluticasone Propionate (GOODSENSE 24-HR ALLERGY NASAL NA) Place 1 spray into the nose daily as needed (allergies).   06/02/2022   ibuprofen (ADVIL) 600 MG tablet Take 1 tablet (600 mg total) by mouth 4 (four) times daily. (Patient taking differently: Take 600 mg by mouth every 6 (six) hours as needed for moderate pain.) 120 tablet 1    loratadine (CLARITIN) 10 MG tablet Take 10 mg by  mouth daily as needed (wheezing).   06/02/2022   losartan (COZAAR) 50 MG tablet TAKE 1 TABLET EVERY DAY 90 tablet 3 06/01/2022   methocarbamol (ROBAXIN) 750 MG tablet Take 1 tablet (750 mg total) by mouth 4 (four) times daily. 120 tablet 1 Past Week   silodosin (RAPAFLO) 8 MG CAPS capsule Take 1 capsule (8 mg total) by mouth daily with breakfast. 30 capsule 3 06/02/2022   [DISCONTINUED] warfarin (COUMADIN) 5 MG tablet Take 2.5 mg by mouth daily.   05/28/2022   Scheduled:   atorvastatin  40 mg Oral Daily   carvedilol  6.25 mg Oral BID WC    Chlorhexidine Gluconate Cloth  6 each Topical Daily   docusate sodium  100 mg Oral BID   finasteride  5 mg Oral Daily   ipratropium-albuterol  3 mL Nebulization Q6H WA   loratadine  10 mg Oral Daily   losartan  50 mg Oral Daily   pantoprazole  40 mg Oral BID   predniSONE  40 mg Oral QAC breakfast   tamsulosin  0.4 mg Oral Daily   Infusions:   Assessment: 43 yoM admitted on 12/4 for acute hypoxic respiratory failure following ERCP.  PMH is significant for Afib on chronic warfarin and pharmacy is consulted to resume Warfarin dosing on 12/5.   PTA warfarin 2.5 mg daily - last dose taken on 11/29, then held prior to GI procedure.  Today, 06/03/2022:  INR 1.6 CBC: Hgb down to 11.7 (baseline 10.5-12.2) and Plt 145k No bleeding or complications reported.    Goal of Therapy:  INR 2-3 Monitor platelets by anticoagulation protocol: Yes   Plan:  Warfarin 5 mg PO, boosted dose x 1. Daily PT/INR. Monitor for signs and symptoms of bleeding.   Gretta Arab PharmD, BCPS [facility] main pharmacy [phone] 06/03/2022 8:52 AM

## 2022-06-03 NOTE — TOC Progression Note (Signed)
Transition of Care [facility]) - Progression Note    Patient Details  Name: [name] MRN: [id number] Date of Birth: 1946-03-25  Transition of Care [facility]) CM/SW Contact  Purcell Mouton, RN Phone Number: 06/03/2022, 12:28 PM  Clinical Narrative:     Pt from home with with spouse. Chart reviewed.  Expected Discharge Plan: Home/Self Care Barriers to Discharge: No Barriers Identified  Expected Discharge Plan and Services Expected Discharge Plan: Home/Self Care         Expected Discharge Date: 06/03/22                                     Social Determinants of Health (SDOH) Interventions    Readmission Risk Interventions     No data to display

## 2022-06-03 NOTE — Progress Notes (Addendum)
[facility] Progress Note  CC: Bile duct stones and respiratory distress  Subjective:  Feels fine.  Wants to go home.  Tolerating diet.  Objective:  Vital signs in last 24 hours: Temp:  [96.6 F (35.9 C)-97.8 F (36.6 C)] 97.8 F (36.6 C) (12/05 0800) Pulse Rate:  [64-102] 88 (12/05 0800) Resp:  [15-38] 20 (12/05 0800) BP: (131-186)/(74-121) 131/95 (12/05 0800) SpO2:  [85 %-98 %] 93 % (12/05 0833) Weight:  [74.6 kg-75.3 kg] 74.6 kg (12/04 2340) Last BM Date : 06/02/22 General:  Alert, Well-developed, in NAD Heart:  Regular rate and rhythm; no murmurs Pulm:  CTAB.  No W/R/R. Abdomen:  Soft, non-distended.  BS present.  Non-tender. Extremities:  Without edema. Neurologic:  Alert and oriented x 4;  grossly normal neurologically. Psych:  Alert and cooperative. Normal mood and affect.  Intake/Output from previous day: 12/04 0701 - 12/05 0700 In: 1720 [P.O.:720; I.V.:800; IV Piggyback:200] Out: 750 [Urine:750]  Lab Results: Recent Labs    06/03/22 0036 06/03/22 0328  WBC 5.3 4.9  HGB 12.1* 11.7*  HCT 39.2 38.3*  PLT 153 145*   BMET Recent Labs    06/03/22 0036 06/03/22 0328  NA 142 141  K 3.6 3.3*  CL 106 106  CO2 26 23  GLUCOSE 128* 122*  BUN 21 21  CREATININE 1.11 1.05  CALCIUM 8.4* 8.3*   LFT Recent Labs    06/03/22 0328  PROT 6.7  ALBUMIN 3.2*  AST 29  ALT 15  ALKPHOS 87  BILITOT 1.3*   PT/INR Recent Labs    06/03/22 0328  LABPROT 18.7*  INR 1.6*   DG Chest Port 1 View  Result Date: 06/02/2022 CLINICAL DATA:  Shortness of breath EXAM: PORTABLE CHEST 1 VIEW COMPARISON:  04/24/2022 FINDINGS: Transverse diameter of heart is increased. There is increased opacity in both lower lung fields. There is no pneumothorax. There is previous arthroplasty in both shoulders. Dense calcifications are seen in thoracic aorta. IMPRESSION: Cardiomegaly. Increased density in both lower lung fields may suggest increase in bilateral pleural  effusions and possibly underlying atelectasis/pneumonia. Electronically Signed   By: Elmer Picker M.D.   On: 06/02/2022 17:00   DG ERCP  Result Date: 06/02/2022 CLINICAL DATA:  Choledocholithiasis EXAM: ERCP COMPARISON:  Intraoperative cholangiogram, 04/21/2022. IR fluoroscopy, 02/14/2022. FLUOROSCOPY: Exposure Index (as provided by the fluoroscopic device): 76.7 mGy Kerma FINDINGS: Multiple, limited oblique planar images of the RIGHT upper quadrant obtained C-arm. Images demonstrating flexible endoscopy, pancreatic duct cannulation and plastic stent placement. IMPRESSION: Fluoroscopic imaging for ERCP and pancreatic duct stent placement. For complete description of intra procedural findings, please see performing service dictation. Electronically Signed   By: Michaelle Birks M.D.   On: 06/02/2022 16:27   DG C-Arm 1-60 Min-No Report  Result Date: 06/02/2022 Fluoroscopy was utilized by the requesting physician.  No radiographic interpretation.   DG C-Arm 1-60 Min-No Report  Result Date: 06/02/2022 Fluoroscopy was utilized by the requesting physician.  No radiographic interpretation.    Assessment / Plan: 76 y.o.male who presented for ERCP attempt at biliary cannulation in setting of positive MRI/MRCP and well as positive IOC, however, failed biliary cannulation at recent ERCP attempt by other provider due to biliary orifice completely within a diverticulum.  Unfortunately repeat ERCP by Dr. Rush Landmark yesterday, 12/4, was also unsuccessful.  PD stent was left in place.  Patient was admitted for respiratory distress after the procedure.  Suspect no further attempts at ERCP for now.  -Pantoprazole 40 mg  twice daily for 4 weeks then daily thereafter.  Carafate x 2 weeks. -Okay to resume Coumadin today. -Follow labs while here and then we will arrange for any further monitoring as outpatient. -Discharge when primary service feels he is ready.    LOS: 0 days   Laban Emperor. Zehr  06/03/2022, 9:26  AM   GI attending:  Respiratory issues are over. No signs of ERCP complications Going home Plan to wait and see re: CBD stones and treat if symptomatic Restarting warfarin  Gatha Mayer, MD, [facility]

## 2022-06-03 NOTE — Telephone Encounter (Signed)
-----   Message from Irving Copas., MD sent at 06/03/2022 12:12 PM EST ----- Regarding: Follow-up [name], This patient needs a 2-week KUB and CMP to be performed. You can put it under my name and then I can forwarded to Dr. Carlean Purl. Dr. Carlean Purl will discuss with the patient and family as to [facility] evaluation for repeat attempt at ERCP versus interventional radiology PTBD and subsequent ERCP rendezvous. Thanks. GM

## 2022-06-03 NOTE — Progress Notes (Signed)
Patient and daughter given discharge instructions and verbalized an understanding of all discharge instructions. Patient instructed to wear oxygen at home until he follows up with pcp. Patient initially thought he could wear oxygen as needed. This nurse explained to him and the daughter that since he did drop intermittently he would need to wear 2 liters at all times. Patient's daughter who is a nurse said that she has a spo2 monitor and would check it periodically.

## 2022-06-04 ENCOUNTER — Telehealth: Payer: Self-pay

## 2022-06-04 NOTE — H&P (Signed)
History and Physical    Patient: [name] EHU:[id number] DOB: 07/08/1945 DOA: 06/02/2022 DOS: the patient was seen and examined on 06/04/2022 PCP: Axel Filler, MD  Patient coming from: Home  Chief Complaint: No chief complaint on file.  HPI: [name] is a 76 y.o. male with medical history significant of atrial fibrillation on Coumadin, HTN, emphysema, CAD, HLD, pre-diabetes, choledocholithiasis and bladder repair s/p repair who we are being consulted by [facility] Dr. Rush Landmark for acute hypoxic respiratory failure following ERCP.    Pt has had a complicated GI course in the past few months regarding his gallbladder. In July 2023, he presented with septic shock secondary to acute cholecystitis, non-obstructing choledocholithiasis. He was too ill to tolerate surgery and was discharged with percutaneous chole tube. Hospitalization at that time also complicated by bladder rupture s/p repair with urology. He subsequently followed up in October and had lap chole and ERCP. He required 24 hrs intubation post-op due to respiratory distress. Despite ERCP and IOC, either general surgery nor GI was able to clear stones in his CBP. He presented today to have repeat ERCP subsequently requiring biliary cannulation and pancreatic duct stent.    Post-operatively had increasing oxygen requirement. CXR with bilateral opacity concerning for effusion vs atelectasis. He was based on non-rebreather 15L with O2 93-94% but was progressively able to wean down to 5-7L simple mask following '20mg'$  of IV Lasix.PCCM was consulted by GI and was evaluated by Dr. Verlee Monte who felt hypoxic seems to be related to post-op atelectasis and to admit to stepdown with hospitalist.    Pt alert and was at 94% O2 saturation on 5L [place]. States he feels fine otherwise and no shortness of breath or cough. Having urinary retention following Lasix.       Review of Systems: As mentioned in the history of present illness.  All other systems reviewed and are negative. Past Medical History:  Diagnosis Date   Allergic rhinitis 08/08/2013   takes Loratadine daily    Aortic stenosis 09/08/2011   With mild aortic regurgitation, mean gradient 13 mmHg    Cataract    right but immature   CHF (congestive heart failure) ([place]) 7858   Complication of anesthesia    stopped breathing - during shoulder surgery 2009-2010   Coronary artery disease 10/02/2009   Cardiac cath (October 2007): 20% left main, 60% mid LAD, 60-70% distal LAD, 30-40% first diagonal, 30% circumflex, 40% OM, 30-40% RCA    Degenerative joint disease of shoulder 08/03/2013   Bilateral, s/p right total shoulder arthroplasty 05/29/2011 and left shoulder arthroplasty 85/08/7739   Diastolic dysfunction 28/78/6767   Echo (04/04/2016): Grade I   Diverticulosis 10/07/2013   Seen on colonoscopy in 2007    Erectile dysfunction 05/07/2009   Essential hypertension 08/03/2013   had been on Lisinopril but stopped per MD   First degree AV block 03/14/2016   Gastric ulcer 10/07/2013   Seen on EGD 04/10/2006    Hemorrhoids 10/07/2013   Hyperlipidemia 10/02/2009   Paroxysmal atrial fibrillation ([place]) 07/14/2006   One episode, provoked by alcohol use disorder, briefly anticoagulated with warfarin, then switched to aspirin alone   Sciatica associated with disorder of lumbar spine 08/03/2013   Anterolisthesis with right L 4-5 nerve root compression.  Treated with epidural injections and Physical Therapy.    Seborrheic keratosis 10/07/2013   Tubular adenoma of colon    Past Surgical History:  Procedure Laterality Date   CARDIAC CATHETERIZATION  2007   CARDIOVERSION  CARDIOVERSION N/A 08/04/2019   Procedure: CARDIOVERSION;  Surgeon: Fay Records, MD;  Location: [facility] ENDOSCOPY;  Service: Cardiovascular;  Laterality: N/A;   CHOLECYSTECTOMY N/A 04/21/2022   Procedure: LAPAROSCOPIC CHOLECYSTECTOMY;  Surgeon: Jesusita Oka, MD;  Location: [place];  Service:  General;  Laterality: N/A;   COLONOSCOPY     CYSTOSCOPY N/A 01/04/2022   Procedure: CYSTOSCOPY;  Surgeon: Raynelle Bring, MD;  Location: [place];  Service: Urology;  Laterality: N/A;   ERCP N/A 04/21/2022   Procedure: ATTEMPTED ENDOSCOPIC RETROGRADE CHOLANGIOPANCREATOGRAPHY (ERCP);  Surgeon: Gatha Mayer, MD;  Location: [facility];  Service: Gastroenterology;  Laterality: N/A;   EYE SURGERY Bilateral 2022   bilateral cataracts   INSERTION OF MESH N/A 03/11/2019   Procedure: Insertion Of Mesh;  Surgeon: Ralene Ok, MD;  Location: [place];  Service: General;  Laterality: N/A;   INTRAOPERATIVE CHOLANGIOGRAM N/A 04/21/2022   Procedure: INTRAOPERATIVE CHOLANGIOGRAM;  Surgeon: Jesusita Oka, MD;  Location: [place];  Service: General;  Laterality: N/A;   IR EXCHANGE BILIARY DRAIN  02/14/2022   IR PERC CHOLECYSTOSTOMY  12/29/2021   JOINT REPLACEMENT     KNEE ARTHROSCOPY WITH MENISCAL REPAIR Right 01/30/2011   LAPAROTOMY N/A 01/04/2022   Procedure: EXPLORATORY LAPAROTOMY WITH EXTRAPERITONEAL BLADDER REPAIR;  Surgeon: Raynelle Bring, MD;  Location: [place];  Service: Urology;  Laterality: N/A;   right finger surgery     as a child   TONSILLECTOMY     TOTAL KNEE ARTHROPLASTY Left 07/28/2017   Procedure: LEFT TOTAL KNEE ARTHROPLASTY;  Surgeon: Renette Butters, MD;  Location: [place];  Service: Orthopedics;  Laterality: Left;   TOTAL SHOULDER ARTHROPLASTY  05/29/2011   Procedure: TOTAL SHOULDER ARTHROPLASTY;  Surgeon: Metta Clines Supple;  Location: [place];  Service: Orthopedics;  Laterality: Right;   TOTAL SHOULDER ARTHROPLASTY Left 06/08/2014   DR SUPPLE   TOTAL SHOULDER ARTHROPLASTY Left 06/08/2014   Procedure: LEFT TOTAL SHOULDER ARTHROPLASTY;  Surgeon: Marin Shutter, MD;  Location: [place];  Service: Orthopedics;  Laterality: Left;   UMBILICAL HERNIA REPAIR N/A 03/11/2019   Procedure: LAPAROSCOPIC UMBILICAL HERNIA;  Surgeon: Ralene Ok, MD;  Location: [place];  Service: General;  Laterality: N/A;    Social History:  reports that he quit smoking about 11 years ago. His smoking use included cigarettes. He started smoking about 53 years ago. He has a 63.00 pack-year smoking history. He has never used smokeless tobacco. He reports that he does not drink alcohol and does not use drugs.  No Known Allergies  Family History  Problem Relation Age of Onset   Bradycardia Mother 68       Requiring pacemeaker placement   Coronary artery disease Father 33       Died of myocardial infarction   Coronary artery disease Brother 26       Died of myocardial infarction   Obesity Daughter    Healthy Son    Colon cancer Neg Hx    Esophageal cancer Neg Hx    Stomach cancer Neg Hx    Colon polyps Neg Hx     Prior to Admission medications   Medication Sig Start Date End Date Taking? Authorizing Provider  acetaminophen (TYLENOL) 500 MG tablet Take 2 tablets (1,000 mg total) by mouth 4 (four) times daily. 04/23/22  Yes Lovick, Montel Culver, MD  albuterol (VENTOLIN HFA) 108 (90 Base) MCG/ACT inhaler Inhale 2 puffs into the lungs every 6 (six) hours as needed for wheezing or shortness of breath. 06/03/22  Yes Rai, Ripudeep K, MD  atorvastatin (LIPITOR) 40 MG tablet Take 1 tablet (40 mg total) by mouth daily. 01/20/22  Yes Lacinda Axon, MD  carvedilol (COREG) 6.25 MG tablet Take 1 tablet (6.25 mg total) by mouth 2 (two) times daily with a meal. 01/20/22  Yes Amponsah, Charisse March, MD  docusate sodium (COLACE) 100 MG capsule Take 1 capsule (100 mg total) by mouth 2 (two) times daily. 04/23/22  Yes Lovick, Montel Culver, MD  ePHEDrine HCl (PRIMATENE) 12.5 MG TABS Take 12.5 mg by mouth daily as needed (wheezing).   Yes [provider]  finasteride (PROSCAR) 5 MG tablet Take 1 tablet (5 mg total) by mouth daily. 01/21/22  Yes Lacinda Axon, MD  Fluticasone Propionate (GOODSENSE 24-HR ALLERGY NASAL NA) Place 1 spray into the nose daily as needed (allergies).   Yes [provider]  ibuprofen  (ADVIL) 600 MG tablet Take 1 tablet (600 mg total) by mouth 4 (four) times daily. Patient taking differently: Take 600 mg by mouth every 6 (six) hours as needed for moderate pain. 04/23/22  Yes Jesusita Oka, MD  loratadine (CLARITIN) 10 MG tablet Take 10 mg by mouth daily as needed (wheezing).   Yes [provider]  losartan (COZAAR) 50 MG tablet TAKE 1 TABLET EVERY DAY 04/30/22  Yes Crenshaw, Denice Bors, MD  methocarbamol (ROBAXIN) 750 MG tablet Take 1 tablet (750 mg total) by mouth 4 (four) times daily. 04/23/22  Yes Jesusita Oka, MD  pantoprazole (PROTONIX) 40 MG tablet Take 1 tablet (40 mg total) by mouth 2 (two) times daily before a meal. 06/02/22 06/02/23 Yes Mansouraty, Telford Nab., MD  silodosin (RAPAFLO) 8 MG CAPS capsule Take 1 capsule (8 mg total) by mouth daily with breakfast. 02/27/22  Yes Axel Filler, MD  sucralfate (CARAFATE) 1 g tablet Take 1 tablet (1 g total) by mouth 2 (two) times daily for 14 days. 06/02/22 06/16/22 Yes Mansouraty, Telford Nab., MD  predniSONE (DELTASONE) 20 MG tablet Take 2 tablets (40 mg total) by mouth daily before breakfast for 5 days. 06/04/22 06/09/22  Rai, Vernelle Emerald, MD  warfarin (COUMADIN) 5 MG tablet Take 0.5 tablets (2.5 mg total) by mouth daily. 06/03/22   Mansouraty, Telford Nab., MD    Physical Exam: Vitals:   06/03/22 1000 06/03/22 1100 06/03/22 1300 06/03/22 1331  BP: 137/71 (!) 151/66    Pulse: 82 90    Resp: 19 (!) 21    Temp:   98.2 F (36.8 C)   TempSrc:   Oral   SpO2: 91% 95%  (!) 3%  Weight:      Height:       Constitutional: NAD, calm, comfortable, pale appearing elderly male sitting upright in bed Eyes: lids and conjunctivae normal ENMT: Mucous membranes are moist.  Neck: normal, supple Respiratory: clear to auscultation bilaterally, no wheezing, no crackles. Normal respiratory effort. No accessory muscle use.  On 5 L via nasal cannula. Cardiovascular: Regular rate and rhythm, systolic flow murmur.  No  extremity edema.  Abdomen: firm mildly distended abdomen with no tenderness, bowel sounds positive.  Musculoskeletal: no clubbing / cyanosis. No joint deformity upper and lower extremities. Good ROM, no contractures. Normal muscle tone.  Skin: no rashes, lesions, ulcers. No induration Neurologic: CN 2-12 grossly intact.  Strength 5/5 in all 4.  Psychiatric: Normal judgment and insight. Alert and oriented x 3. Normal mood. Data Reviewed:  See HPI  Assessment and Plan: * Acute respiratory failure with hypoxia ([place]) -  Acute post-op hypoxia with CXR revealing for bilateral pleural effusion and atelectasis  -Initially required 15L NRB now able to wean down to 5L via [place] following '20mg'$  IV Lasix -PCCM evaluated and recommended additional IV '40mg'$  Lasix -will follow intake and output -Goal O2 >92% -Bronchodilator PRN  Acute urinary retention -unable to void despite IV '20mg'$  Lasix. Likely post-op atony.  -bladder scan ordered give hx of extraperitoneal bladder rupture early this year- scan showed 569m -will obtain in and out cath-RN reports that it was a difficulty insertion. Pt has hx of requiring urethral meatus dilation with urology in the past. -continue to monitor urine output -continue rapaflow and proscar  Choledocholithiasis Complicated GI course with cholecystitis in July requiring percutaneous cholecystostomy tube, subsequent cholecystectomy and failed ERCP despite abnormal IOP. -Repeat ERCP attempt today subsequently requiring sphincterotomy and pancreatic stent placement -Per GI, if patient has obstructive LFT pattern would need to pursue IR PTBD [facility] to [facility]  -recommends clear liquid diet post-op and advance diet as tolerated -recommends resuming warfarin for a.fib tomorrow -continue PPI BID and carafate 1g BID  Atrial fibrillation (H[facility]) -rate controlled -continue beta-blocker -resume warfarin tomorrow at GI  recommendation. Check INR tomorrow.   Essential hypertension -mildly elevated -will be receiving additional IV Lasix for respiratory issue -continue home antihypertensive  CAD (coronary artery disease) -to resume Coumadin for a fib tomorrow -continue beta-blocker, statin  Hyperlipidemia -continue statin      Advance Care Planning: Code Status: DNR -confirmed with pt at bedside   Consults: [facility]   Family Communication: none at bedside   Severity of Illness: The appropriate patient status for this patient is OBSERVATION. Observation status is judged to be reasonable and necessary in order to provide the required intensity of service to ensure the patient's safety. The patient's presenting symptoms, physical exam findings, and initial radiographic and laboratory data in the context of their medical condition is felt to place them at decreased risk for further clinical deterioration. Furthermore, it is anticipated that the patient will be medically stable for discharge from the hospital within 2 midnights of admission.   Author: COrene Desanctis DO 06/04/2022 1:17 AM  For on call review [email or web address]

## 2022-06-04 NOTE — Patient Outreach (Signed)
  Care Coordination TOC Note Transition Care Management Follow-up Telephone Call Date of discharge and from where: [facility] 06/03/22 How have you been since you were released from the hospital? "I feel great, I am making breakfast as we speak". Any questions or concerns? No  Items Reviewed: Did the pt receive and understand the discharge instructions provided? Yes  Medications obtained and verified? Yes  Other? No  Any new allergies since your discharge? No  Dietary orders reviewed? Yes Do you have support at home? Yes   Home Care and Equipment/Supplies: Were home health services ordered? no If so, what is the name of the agency? N/a  Has the agency set up a time to come to the patient's home? not applicable Were any new equipment or medical supplies ordered?  Yes: Oxygen What is the name of the medical supply agency? [employer] Were you able to get the supplies/equipment? yes Do you have any questions related to the use of the equipment or supplies? No  Functional Questionnaire: (I = Independent and D = Dependent) ADLs: I  Bathing/Dressing- I  Meal Prep- I  Eating- I  Maintaining continence- I  Transferring/Ambulation- I  Managing Meds- I  Follow up appointments reviewed:  PCP Hospital f/u appt confirmed? No   Specialist Hospital f/u appt confirmed? No   Are transportation arrangements needed? No  If their condition worsens, is the pt aware to call PCP or go to the Emergency Dept.? Yes Was the patient provided with contact information for the PCP's office or ED? Yes Was to pt encouraged to call back with questions or concerns? Yes  SDOH assessments and interventions completed:   Yes SDOH Interventions Today    Flowsheet Row Most Recent Value  SDOH Interventions   Housing Interventions Intervention Not Indicated  Financial Strain Interventions Intervention Not Indicated       Care Coordination Interventions:  PCP follow up appointment requested   Encounter  Outcome:  Pt. Visit Completed

## 2022-06-05 ENCOUNTER — Telehealth: Payer: Self-pay | Admitting: Internal Medicine

## 2022-06-05 ENCOUNTER — Encounter (HOSPITAL_COMMUNITY): Payer: Self-pay | Admitting: Gastroenterology

## 2022-06-05 NOTE — Telephone Encounter (Signed)
Patient is calling states hosptial said to do a F/U within two weeks and does not want to wait until next year. Please advise

## 2022-06-06 NOTE — Telephone Encounter (Signed)
Pt questioning when he should have  X ray done:  Chart Reviewed and noted below message from 06/03/2022: Pt made aware to come by office building on 06/17/2022 at have xray completed along with labs drawn. Both are completed in the basement Pt verbalized understanding with all questions answered.    Signed      ----- Message from Irving Copas., MD sent at 06/03/2022 12:12 PM EST ----- Regarding: Follow-up Patty, This patient needs a 2-week KUB and CMP to be performed. You can put it under my name and then I can forwarded to Dr. Carlean Purl. Dr. Carlean Purl will discuss with the patient and family as to [facility] evaluation for repeat attempt at ERCP versus interventional radiology PTBD and subsequent ERCP rendezvous. Thanks. GM

## 2022-06-09 LAB — SURGICAL PATHOLOGY

## 2022-06-10 ENCOUNTER — Ambulatory Visit: Payer: Medicare HMO | Attending: Internal Medicine | Admitting: *Deleted

## 2022-06-10 ENCOUNTER — Encounter: Payer: Self-pay | Admitting: Gastroenterology

## 2022-06-10 DIAGNOSIS — I4819 Other persistent atrial fibrillation: Secondary | ICD-10-CM | POA: Diagnosis not present

## 2022-06-10 DIAGNOSIS — Z5181 Encounter for therapeutic drug level monitoring: Secondary | ICD-10-CM | POA: Diagnosis not present

## 2022-06-10 LAB — POCT INR: INR: 3.1 — AB (ref 2.0–3.0)

## 2022-06-10 NOTE — Patient Instructions (Addendum)
Description   Do not take any warfarin today then continue taking warfarin 1/2 tablet (2.'5mg'$ ) daily. Remain consistent with leafy veggies. Recheck INR in 1 week. Coumadin Clinic [phone] or [phone]

## 2022-06-16 ENCOUNTER — Telehealth: Payer: Self-pay | Admitting: *Deleted

## 2022-06-16 DIAGNOSIS — M25561 Pain in right knee: Secondary | ICD-10-CM | POA: Diagnosis not present

## 2022-06-16 NOTE — Telephone Encounter (Signed)
Pharmacy, can you please comment on how long Coumadin can be held for upcoming procedure?  Thank you! 

## 2022-06-16 NOTE — Telephone Encounter (Signed)
   Pre-operative Risk Assessment    Patient Name: [name]  DOB: [birth year] MRN: [id number]      Request for Surgical Clearance  Procedure:   RIGHT TOTAL KNEE REPLACEMENT  Date of Surgery:  Clearance TBD                                 Surgeon:  DR. Arliss Journey Surgeon's Group or Practice Name:  [facility] Phone number:  [phone] EXT [phone] ATTN: [place] Fax number:  [phone]   Type of Clearance Requested:   - Medical  - Pharmacy:  Hold Warfarin (Coumadin) [place]   Type of Anesthesia:  Spinal   Additional requests/questions:    [name]   06/16/2022, 2:23 PM

## 2022-06-17 ENCOUNTER — Ambulatory Visit: Payer: Medicare HMO | Attending: Cardiology

## 2022-06-17 ENCOUNTER — Ambulatory Visit (INDEPENDENT_AMBULATORY_CARE_PROVIDER_SITE_OTHER)
Admission: RE | Admit: 2022-06-17 | Discharge: 2022-06-17 | Disposition: A | Discharge status: Home / Self Care | Payer: Medicare HMO | Source: Ambulatory Visit | Attending: Gastroenterology | Admitting: Gastroenterology

## 2022-06-17 ENCOUNTER — Other Ambulatory Visit (INDEPENDENT_AMBULATORY_CARE_PROVIDER_SITE_OTHER): Payer: Medicare HMO

## 2022-06-17 DIAGNOSIS — I4891 Unspecified atrial fibrillation: Secondary | ICD-10-CM | POA: Diagnosis not present

## 2022-06-17 DIAGNOSIS — K831 Obstruction of bile duct: Secondary | ICD-10-CM

## 2022-06-17 DIAGNOSIS — Z5181 Encounter for therapeutic drug level monitoring: Secondary | ICD-10-CM

## 2022-06-17 DIAGNOSIS — Z4659 Encounter for fitting and adjustment of other gastrointestinal appliance and device: Secondary | ICD-10-CM | POA: Diagnosis not present

## 2022-06-17 LAB — COMPREHENSIVE METABOLIC PANEL
ALT: 14 U/L (ref 0–53)
AST: 23 U/L (ref 0–37)
Albumin: 3.4 g/dL — ABNORMAL LOW (ref 3.5–5.2)
Alkaline Phosphatase: 77 U/L (ref 39–117)
BUN: 17 mg/dL (ref 6–23)
CO2: 29 mEq/L (ref 19–32)
Calcium: 8.8 mg/dL (ref 8.4–10.5)
Chloride: 106 mEq/L (ref 96–112)
Creatinine, Ser: 1.09 mg/dL (ref 0.40–1.50)
GFR: 66.7 mL/min (ref >60.00)
Glucose, Bld: 97 mg/dL (ref 70–99)
Potassium: 3.3 mEq/L — ABNORMAL LOW (ref 3.5–5.1)
Sodium: 143 mEq/L (ref 135–145)
Total Bilirubin: 1.1 mg/dL (ref 0.2–1.2)
Total Protein: 6.5 g/dL (ref 6.0–8.3)

## 2022-06-17 LAB — POCT INR: INR: 3.2 — AB (ref 2.0–3.0)

## 2022-06-17 NOTE — Patient Instructions (Signed)
Do not take any warfarin tomorrow then continue taking warfarin 1/2 tablet (2.'5mg'$ ) daily. Remain consistent with leafy veggies. Recheck INR in 2 weeks. Coumadin Clinic [phone] or [phone]

## 2022-06-17 NOTE — Telephone Encounter (Signed)
Patient with diagnosis of atrial fibrillation on warfarin for anticoagulation.    Procedure: Right TKA Date of procedure: TBD   CHA2DS2-VASc Score = 4   This indicates a 4.8% annual risk of stroke. The patient's score is based upon: CHF History: 1 HTN History: 1 Diabetes History: 0 Stroke History: 0 Vascular Disease History: 1 Age Score: 1 Gender Score: 0    CrCl 68 Platelet count 145  Per office protocol, patient can hold warfarin for 5 days prior to procedure.   Patient will not need bridging with Lovenox (enoxaparin) around procedure.  Pt next INR check at [facility] office 06/17/22 - will make Coumadin Clinic aware of need for 5 day hold  **This guidance is not considered finalized until pre-operative APP has relayed final recommendations.**

## 2022-06-17 NOTE — Telephone Encounter (Signed)
   Name: [name]  DOB: 09/05/1945  MRN: [id number]  Primary Cardiologist: Kirk Ruths, MD   Preoperative team, please contact this patient and set up a phone call appointment for further preoperative risk assessment. Please obtain consent and complete medication review. Thank you for your help.  I confirm that guidance regarding antiplatelet and oral anticoagulation therapy has been completed and, if necessary, noted below.   Per office protocol, patient can hold warfarin for 5 days prior to procedure.   Patient will not need bridging with Lovenox (enoxaparin) around procedure.   Per PharmD, pt next INR check at [facility] office 06/17/22 - will make Coumadin Clinic aware of need for 5 day hold.   Lenna Sciara, NP 06/17/2022, 12:33 PM [place]

## 2022-06-18 ENCOUNTER — Telehealth: Payer: Self-pay | Admitting: *Deleted

## 2022-06-18 NOTE — Telephone Encounter (Signed)
Pt scheduled for tele pre op appt 06/25/22 @ 10:20. Med rec and consent are done.

## 2022-06-18 NOTE — Telephone Encounter (Signed)
Pt scheduled for tele pre op appt 06/25/22 @ 10:20. Med rec and consent are done.     Patient Consent for Virtual Visit        [name] has provided verbal consent on 06/18/2022 for a virtual visit (video or telephone).   CONSENT FOR VIRTUAL VISIT FOR:  [name]  By participating in this virtual visit I agree to the following:  I hereby voluntarily request, consent and authorize [facility] and its employed or contracted physicians, physician assistants, nurse practitioners or other licensed health care professionals (the Practitioner), to provide me with telemedicine health care services (the "Services") as deemed necessary by the treating Practitioner. I acknowledge and consent to receive the Services by the Practitioner via telemedicine. I understand that the telemedicine visit will involve communicating with the Practitioner through live audiovisual communication technology and the disclosure of certain medical information by electronic transmission. I acknowledge that I have been given the opportunity to request an in-person assessment or other available alternative prior to the telemedicine visit and am voluntarily participating in the telemedicine visit.  I understand that I have the right to withhold or withdraw my consent to the use of telemedicine in the course of my care at any time, without affecting my right to future care or treatment, and that the Practitioner or I may terminate the telemedicine visit at any time. I understand that I have the right to inspect all information obtained and/or recorded in the course of the telemedicine visit and may receive copies of available information for a reasonable fee.  I understand that some of the potential risks of receiving the Services via telemedicine include:  Delay or interruption in medical evaluation due to technological equipment failure or disruption; Information transmitted may not be sufficient (e.g.  poor resolution of images) to allow for appropriate medical decision making by the Practitioner; and/or  In rare instances, security protocols could fail, causing a breach of personal health information.  Furthermore, I acknowledge that it is my responsibility to provide information about my medical history, conditions and care that is complete and accurate to the best of my ability. I acknowledge that Practitioner's advice, recommendations, and/or decision may be based on factors not within their control, such as incomplete or inaccurate data provided by me or distortions of diagnostic images or specimens that may result from electronic transmissions. I understand that the practice of medicine is not an exact science and that Practitioner makes no warranties or guarantees regarding treatment outcomes. I acknowledge that a copy of this consent can be made available to me via my patient portal ([facility]), or I can request a printed copy by calling the office of [facility].    I understand that my insurance will be billed for this visit.   I have read or had this consent read to me. I understand the contents of this consent, which adequately explains the benefits and risks of the Services being provided via telemedicine.  I have been provided ample opportunity to ask questions regarding this consent and the Services and have had my questions answered to my satisfaction. I give my informed consent for the services to be provided through the use of telemedicine in my medical care

## 2022-06-19 ENCOUNTER — Other Ambulatory Visit: Payer: Self-pay

## 2022-06-19 DIAGNOSIS — Z9889 Other specified postprocedural states: Secondary | ICD-10-CM

## 2022-06-19 DIAGNOSIS — K831 Obstruction of bile duct: Secondary | ICD-10-CM

## 2022-06-25 ENCOUNTER — Ambulatory Visit: Payer: Medicare HMO | Attending: Cardiovascular Disease | Admitting: Cardiology

## 2022-06-25 ENCOUNTER — Encounter: Payer: Self-pay | Admitting: Cardiology

## 2022-06-25 DIAGNOSIS — Z01810 Encounter for preprocedural cardiovascular examination: Secondary | ICD-10-CM | POA: Diagnosis not present

## 2022-06-25 NOTE — Progress Notes (Signed)
Virtual Visit via Telephone Note   Because of [name] co-morbid illnesses, he is at least at moderate risk for complications without adequate follow up.  This format is felt to be most appropriate for this patient at this time.  The patient did not have access to video technology/had technical difficulties with video requiring transitioning to audio format only (telephone).  All issues noted in this document were discussed and addressed.  No physical exam could be performed with this format.  Please refer to the patient's chart for his consent to telehealth for [facility].  Evaluation Performed:  Preoperative cardiovascular risk assessment _____________   Date:  06/25/2022   Patient ID:  [name], DOB 1945-11-21, MRN [id number] Patient Location:  Home Provider location:   Office  Primary Care Provider:  Axel Filler, MD Primary Cardiologist:  Kirk Ruths, MD  Chief Complaint / Patient Profile   76 y.o. y/o male with a h/o coronary artery disease, cardiomyopathy (LV normal on most recent echo), aortic stenosis, paroxysmal atrial fibrillation (coumadin secondary to cost for DOAC), HLD, HTN.  Who is pending right total knee replacement with Dr. Edmonia Lynch, date TBD, and presents today for telephonic preoperative cardiovascular risk assessment.  History of Present Illness    [name] is a 76 y.o. male who presents via audio/video conferencing for a telehealth visit today.  Pt was last seen in cardiology clinic on 03/13/22 by Dr. Stanford Breed.  At that time JAME MORRELL was doing well.  The patient is now pending procedure as outlined above. Since his last visit, he is doing well. He denies chest pain, palpitations, dyspnea, pnd, orthopnea, n, v, dizziness, syncope, edema, weight gain, or early satiety.   Past Medical History    Past Medical History:  Diagnosis Date   Allergic rhinitis 08/08/2013   takes Loratadine daily     Aortic stenosis 09/08/2011   With mild aortic regurgitation, mean gradient 13 mmHg    Cataract    right but immature   CHF (congestive heart failure) ([place]) 2841   Complication of anesthesia    stopped breathing - during shoulder surgery 2009-2010   Coronary artery disease 10/02/2009   Cardiac cath (October 2007): 20% left main, 60% mid LAD, 60-70% distal LAD, 30-40% first diagonal, 30% circumflex, 40% OM, 30-40% RCA    Degenerative joint disease of shoulder 08/03/2013   Bilateral, s/p right total shoulder arthroplasty 05/29/2011 and left shoulder arthroplasty 32/44/0102   Diastolic dysfunction 72/53/6644   Echo (04/04/2016): Grade I   Diverticulosis 10/07/2013   Seen on colonoscopy in 2007    Erectile dysfunction 05/07/2009   Essential hypertension 08/03/2013   had been on Lisinopril but stopped per MD   First degree AV block 03/14/2016   Gastric ulcer 10/07/2013   Seen on EGD 04/10/2006    Hemorrhoids 10/07/2013   Hyperlipidemia 10/02/2009   Paroxysmal atrial fibrillation ([place]) 07/14/2006   One episode, provoked by alcohol use disorder, briefly anticoagulated with warfarin, then switched to aspirin alone   Sciatica associated with disorder of lumbar spine 08/03/2013   Anterolisthesis with right L 4-5 nerve root compression.  Treated with epidural injections and Physical Therapy.    Seborrheic keratosis 10/07/2013   Tubular adenoma of colon    Past Surgical History:  Procedure Laterality Date   BIOPSY  06/02/2022   Procedure: BIOPSY;  Surgeon: Rush Landmark Telford Nab., MD;  Location: [facility];  Service: Gastroenterology;;   CARDIAC CATHETERIZATION  2007   CARDIOVERSION  CARDIOVERSION N/A 08/04/2019   Procedure: CARDIOVERSION;  Surgeon: Fay Records, MD;  Location: [facility];  Service: Cardiovascular;  Laterality: N/A;   CHOLECYSTECTOMY N/A 04/21/2022   Procedure: LAPAROSCOPIC CHOLECYSTECTOMY;  Surgeon: Jesusita Oka, MD;  Location: [place];  Service: General;   Laterality: N/A;   COLONOSCOPY     CYSTOSCOPY N/A 01/04/2022   Procedure: CYSTOSCOPY;  Surgeon: Raynelle Bring, MD;  Location: [place];  Service: Urology;  Laterality: N/A;   ENDOSCOPIC RETROGRADE CHOLANGIOPANCREATOGRAPHY (ERCP) WITH PROPOFOL N/A 06/02/2022   Procedure: ENDOSCOPIC RETROGRADE CHOLANGIOPANCREATOGRAPHY (ERCP) WITH PROPOFOL;  Surgeon: Rush Landmark Telford Nab., MD;  Location: [facility];  Service: Gastroenterology;  Laterality: N/A;   ERCP N/A 04/21/2022   Procedure: ATTEMPTED ENDOSCOPIC RETROGRADE CHOLANGIOPANCREATOGRAPHY (ERCP);  Surgeon: Gatha Mayer, MD;  Location: [place];  Service: Gastroenterology;  Laterality: N/A;   ESOPHAGOGASTRODUODENOSCOPY (EGD) WITH PROPOFOL N/A 06/02/2022   Procedure: ESOPHAGOGASTRODUODENOSCOPY (EGD) WITH PROPOFOL;  Surgeon: Rush Landmark Telford Nab., MD;  Location: [facility];  Service: Gastroenterology;  Laterality: N/A;   EYE SURGERY Bilateral 2022   bilateral cataracts   HEMOSTASIS CLIP PLACEMENT  06/02/2022   Procedure: HEMOSTASIS CLIP PLACEMENT;  Surgeon: Irving Copas., MD;  Location: [facility];  Service: Gastroenterology;;   INSERTION OF MESH N/A 03/11/2019   Procedure: Insertion Of Mesh;  Surgeon: Ralene Ok, MD;  Location: [place];  Service: General;  Laterality: N/A;   INTRAOPERATIVE CHOLANGIOGRAM N/A 04/21/2022   Procedure: INTRAOPERATIVE CHOLANGIOGRAM;  Surgeon: Jesusita Oka, MD;  Location: [place];  Service: General;  Laterality: N/A;   IR EXCHANGE BILIARY DRAIN  02/14/2022   IR PERC CHOLECYSTOSTOMY  12/29/2021   JOINT REPLACEMENT     KNEE ARTHROSCOPY WITH MENISCAL REPAIR Right 01/30/2011   LAPAROTOMY N/A 01/04/2022   Procedure: EXPLORATORY LAPAROTOMY WITH EXTRAPERITONEAL BLADDER REPAIR;  Surgeon: Raynelle Bring, MD;  Location: [place];  Service: Urology;  Laterality: N/A;   PANCREATIC STENT PLACEMENT  06/02/2022   Procedure: PANCREATIC STENT PLACEMENT;  Surgeon: Irving Copas., MD;  Location: [facility];   Service: Gastroenterology;;   POLYPECTOMY  06/02/2022   Procedure: POLYPECTOMY;  Surgeon: Irving Copas., MD;  Location: [facility];  Service: Gastroenterology;;   right finger surgery     as a child   SPHINCTEROTOMY  06/02/2022   Procedure: Joan Mayans;  Surgeon: Irving Copas., MD;  Location: [facility];  Service: Gastroenterology;;   TONSILLECTOMY     TOTAL KNEE ARTHROPLASTY Left 07/28/2017   Procedure: LEFT TOTAL KNEE ARTHROPLASTY;  Surgeon: Renette Butters, MD;  Location: [place];  Service: Orthopedics;  Laterality: Left;   TOTAL SHOULDER ARTHROPLASTY  05/29/2011   Procedure: TOTAL SHOULDER ARTHROPLASTY;  Surgeon: Metta Clines Supple;  Location: [place];  Service: Orthopedics;  Laterality: Right;   TOTAL SHOULDER ARTHROPLASTY Left 06/08/2014   DR SUPPLE   TOTAL SHOULDER ARTHROPLASTY Left 06/08/2014   Procedure: LEFT TOTAL SHOULDER ARTHROPLASTY;  Surgeon: Marin Shutter, MD;  Location: [place];  Service: Orthopedics;  Laterality: Left;   UMBILICAL HERNIA REPAIR N/A 03/11/2019   Procedure: LAPAROSCOPIC UMBILICAL HERNIA;  Surgeon: Ralene Ok, MD;  Location: [place];  Service: General;  Laterality: N/A;    Allergies  No Known Allergies  Home Medications    Prior to Admission medications   Medication Sig Start Date End Date Taking? Authorizing Provider  acetaminophen (TYLENOL) 500 MG tablet Take 2 tablets (1,000 mg total) by mouth 4 (four) times daily. 04/23/22   Jesusita Oka, MD  albuterol (VENTOLIN HFA) 108 (90 Base)  MCG/ACT inhaler Inhale 2 puffs into the lungs every 6 (six) hours as needed for wheezing or shortness of breath. 06/03/22   Rai, Vernelle Emerald, MD  atorvastatin (LIPITOR) 40 MG tablet Take 1 tablet (40 mg total) by mouth daily. 01/20/22   Lacinda Axon, MD  carvedilol (COREG) 6.25 MG tablet Take 1 tablet (6.25 mg total) by mouth 2 (two) times daily with a meal. 01/20/22   Amponsah, Charisse March, MD  docusate sodium (COLACE) 100 MG capsule Take 1  capsule (100 mg total) by mouth 2 (two) times daily. 04/23/22   Jesusita Oka, MD  ePHEDrine HCl (PRIMATENE) 12.5 MG TABS Take 12.5 mg by mouth daily as needed (wheezing).    [provider]  finasteride (PROSCAR) 5 MG tablet Take 1 tablet (5 mg total) by mouth daily. 01/21/22   Lacinda Axon, MD  Fluticasone Propionate (GOODSENSE 24-HR ALLERGY NASAL NA) Place 1 spray into the nose daily as needed (allergies).    [provider]  ibuprofen (ADVIL) 600 MG tablet Take 1 tablet (600 mg total) by mouth 4 (four) times daily. Patient taking differently: Take 600 mg by mouth every 6 (six) hours as needed for moderate pain. 04/23/22   Jesusita Oka, MD  loratadine (CLARITIN) 10 MG tablet Take 10 mg by mouth daily as needed (wheezing).    [provider]  losartan (COZAAR) 50 MG tablet TAKE 1 TABLET EVERY DAY 04/30/22   Lelon Perla, MD  methocarbamol (ROBAXIN) 750 MG tablet Take 1 tablet (750 mg total) by mouth 4 (four) times daily. 04/23/22   Jesusita Oka, MD  pantoprazole (PROTONIX) 40 MG tablet Take 1 tablet (40 mg total) by mouth 2 (two) times daily before a meal. 06/02/22 06/02/23  Mansouraty, Telford Nab., MD  silodosin (RAPAFLO) 8 MG CAPS capsule Take 1 capsule (8 mg total) by mouth daily with breakfast. 02/27/22   Axel Filler, MD  sucralfate (CARAFATE) 1 g tablet Take 1 tablet (1 g total) by mouth 2 (two) times daily for 14 days. 06/02/22 06/18/22  Mansouraty, Telford Nab., MD  warfarin (COUMADIN) 5 MG tablet Take 0.5 tablets (2.5 mg total) by mouth daily. 06/03/22   Mansouraty, Telford Nab., MD    Physical Exam    Vital Signs:  [name] does not have vital signs available for review today.  Given telephonic nature of communication, physical exam is limited. AAOx3. NAD. Normal affect.  Speech and respirations are unlabored.  Accessory Clinical Findings    None  Assessment & Plan    1.  Preoperative Cardiovascular Risk  Assessment:  According to the Revised Cardiac Risk Index (RCRI), his Perioperative Risk of Major Cardiac Event is (%): 0.4  His Functional Capacity in METs is: 6.05 according to the [employer] Activity Status Index (DASI).   Therefore, based on ACC/AHA guidelines, patient would be at acceptable risk for the planned procedure without further cardiovascular testing.  The patient was advised that if he develops new symptoms prior to surgery to contact our office to arrange for a follow-up visit, and he verbalized understanding.  Per office protocol, patient can hold warfarin for 5 days prior to procedure.  Patient will not need bridging with Lovenox (enoxaparin) around procedure.   A copy of this note will be routed to requesting surgeon.  Time:   Today, I have spent 12 minutes with the patient with telehealth technology discussing medical history, symptoms, and management plan.     Trudi Ida, NP  06/25/2022, 10:30 AM

## 2022-07-02 ENCOUNTER — Other Ambulatory Visit: Payer: Medicare HMO

## 2022-07-02 ENCOUNTER — Ambulatory Visit (INDEPENDENT_AMBULATORY_CARE_PROVIDER_SITE_OTHER)
Admission: RE | Admit: 2022-07-02 | Discharge: 2022-07-02 | Disposition: A | Discharge status: Home / Self Care | Payer: Medicare HMO | Source: Ambulatory Visit | Attending: Internal Medicine | Admitting: Internal Medicine

## 2022-07-02 ENCOUNTER — Ambulatory Visit: Payer: Medicare HMO | Attending: Cardiology

## 2022-07-02 DIAGNOSIS — J90 Pleural effusion, not elsewhere classified: Secondary | ICD-10-CM | POA: Diagnosis not present

## 2022-07-02 DIAGNOSIS — I4891 Unspecified atrial fibrillation: Secondary | ICD-10-CM

## 2022-07-02 DIAGNOSIS — Z8719 Personal history of other diseases of the digestive system: Secondary | ICD-10-CM | POA: Diagnosis not present

## 2022-07-02 DIAGNOSIS — I700 Atherosclerosis of aorta: Secondary | ICD-10-CM | POA: Diagnosis not present

## 2022-07-02 DIAGNOSIS — Z9889 Other specified postprocedural states: Secondary | ICD-10-CM | POA: Diagnosis not present

## 2022-07-02 DIAGNOSIS — K831 Obstruction of bile duct: Secondary | ICD-10-CM | POA: Diagnosis not present

## 2022-07-02 DIAGNOSIS — Z5181 Encounter for therapeutic drug level monitoring: Secondary | ICD-10-CM | POA: Diagnosis not present

## 2022-07-02 LAB — POCT INR: INR: 3.4 — AB (ref 2.0–3.0)

## 2022-07-02 NOTE — Patient Instructions (Signed)
Description   Do not take any warfarin today then continue taking warfarin 1/2 tablet (2.'5mg'$ ) daily. Remain consistent with leafy veggies (2-3 per week).  Recheck INR in 2 weeks.  Coumadin Clinic [phone]

## 2022-07-07 NOTE — H&P (Signed)
KNEE ARTHROPLASTY ADMISSION H&P  Patient ID: [name] MRN: [id number] DOB/AGE: 05-Aug-1945 77 y.o.  Chief Complaint: right knee pain.  Planned Procedure Date: 07/29/22 Medical Clearance by Dr, [name]   Cardiac Clearance by Dr. Stanford Frazier   HPI: [name] is a 77 y.o. male who presents for evaluation of OA RIGHT KNEE. The patient has a history of pain and functional disability in the right knee due to arthritis and has failed non-surgical conservative treatments for greater than 12 weeks to include NSAID's and/or analgesics, corticosteriod injections, viscosupplementation injections, supervised PT with diminished ADL's post treatment, use of assistive devices, and activity modification.  Onset of symptoms was gradual, starting 5 years ago with gradually worsening course since that time. The patient noted prior procedures on the knee to include  arthroscopy and menisectomy on the right knee.  Patient currently rates pain at 10 out of 10 with activity. Patient has night pain, worsening of pain with activity and weight bearing, and pain that interferes with activities of daily living.  Patient has evidence of subchondral sclerosis, periarticular osteophytes, joint space narrowing, and a bipartite patella  by imaging studies.  There is no active infection.  Past Medical History:  Diagnosis Date   Allergic rhinitis 08/08/2013   takes Loratadine daily    Aortic stenosis 09/08/2011   With mild aortic regurgitation, mean gradient 13 mmHg    Cataract    right but immature   CHF (congestive heart failure) ([place]) 4098   Complication of anesthesia    stopped breathing - during shoulder surgery 2009-2010   Coronary artery disease 10/02/2009   Cardiac cath (October 2007): 20% left main, 60% mid LAD, 60-70% distal LAD, 30-40% first diagonal, 30% circumflex, 40% OM, 30-40% RCA    Degenerative joint disease of shoulder 08/03/2013   Bilateral, s/p right total shoulder arthroplasty  05/29/2011 and left shoulder arthroplasty 11/91/4782   Diastolic dysfunction 95/62/1308   Echo (04/04/2016): Grade I   Diverticulosis 10/07/2013   Seen on colonoscopy in 2007    Erectile dysfunction 05/07/2009   Essential hypertension 08/03/2013   had been on Lisinopril but stopped per MD   First degree AV block 03/14/2016   Gastric ulcer 10/07/2013   Seen on EGD 04/10/2006    Hemorrhoids 10/07/2013   Hyperlipidemia 10/02/2009   Paroxysmal atrial fibrillation ([place]) 07/14/2006   One episode, provoked by alcohol use disorder, briefly anticoagulated with warfarin, then switched to aspirin alone   Sciatica associated with disorder of lumbar spine 08/03/2013   Anterolisthesis with right L 4-5 nerve root compression.  Treated with epidural injections and Physical Therapy.    Seborrheic keratosis 10/07/2013   Tubular adenoma of colon    Past Surgical History:  Procedure Laterality Date   BIOPSY  06/02/2022   Procedure: BIOPSY;  Surgeon: Rush Landmark Telford Nab., MD;  Location: [facility];  Service: Gastroenterology;;   CARDIAC CATHETERIZATION  2007   CARDIOVERSION     CARDIOVERSION N/A 08/04/2019   Procedure: CARDIOVERSION;  Surgeon: Fay Records, MD;  Location: [facility];  Service: Cardiovascular;  Laterality: N/A;   CHOLECYSTECTOMY N/A 04/21/2022   Procedure: LAPAROSCOPIC CHOLECYSTECTOMY;  Surgeon: Jesusita Oka, MD;  Location: [facility];  Service: General;  Laterality: N/A;   COLONOSCOPY     CYSTOSCOPY N/A 01/04/2022   Procedure: Consuela Mimes;  Surgeon: Raynelle Bring, MD;  Location: [place];  Service: Urology;  Laterality: N/A;   ENDOSCOPIC RETROGRADE CHOLANGIOPANCREATOGRAPHY (ERCP) WITH PROPOFOL N/A 06/02/2022   Procedure: ENDOSCOPIC RETROGRADE CHOLANGIOPANCREATOGRAPHY (ERCP) WITH  PROPOFOL;  Surgeon: Irving Copas., MD;  Location: [facility];  Service: Gastroenterology;  Laterality: N/A;   ERCP N/A 04/21/2022   Procedure: ATTEMPTED ENDOSCOPIC RETROGRADE  CHOLANGIOPANCREATOGRAPHY (ERCP);  Surgeon: Gatha Mayer, MD;  Location: [place];  Service: Gastroenterology;  Laterality: N/A;   ESOPHAGOGASTRODUODENOSCOPY (EGD) WITH PROPOFOL N/A 06/02/2022   Procedure: ESOPHAGOGASTRODUODENOSCOPY (EGD) WITH PROPOFOL;  Surgeon: Rush Landmark Telford Nab., MD;  Location: [facility];  Service: Gastroenterology;  Laterality: N/A;   EYE SURGERY Bilateral 2022   bilateral cataracts   HEMOSTASIS CLIP PLACEMENT  06/02/2022   Procedure: HEMOSTASIS CLIP PLACEMENT;  Surgeon: Irving Copas., MD;  Location: [facility];  Service: Gastroenterology;;   INSERTION OF MESH N/A 03/11/2019   Procedure: Insertion Of Mesh;  Surgeon: Ralene Ok, MD;  Location: [place];  Service: General;  Laterality: N/A;   INTRAOPERATIVE CHOLANGIOGRAM N/A 04/21/2022   Procedure: INTRAOPERATIVE CHOLANGIOGRAM;  Surgeon: Jesusita Oka, MD;  Location: [place];  Service: General;  Laterality: N/A;   IR EXCHANGE BILIARY DRAIN  02/14/2022   IR PERC CHOLECYSTOSTOMY  12/29/2021   JOINT REPLACEMENT     KNEE ARTHROSCOPY WITH MENISCAL REPAIR Right 01/30/2011   LAPAROTOMY N/A 01/04/2022   Procedure: EXPLORATORY LAPAROTOMY WITH EXTRAPERITONEAL BLADDER REPAIR;  Surgeon: Raynelle Bring, MD;  Location: [place];  Service: Urology;  Laterality: N/A;   PANCREATIC STENT PLACEMENT  06/02/2022   Procedure: PANCREATIC STENT PLACEMENT;  Surgeon: Irving Copas., MD;  Location: [facility];  Service: Gastroenterology;;   POLYPECTOMY  06/02/2022   Procedure: POLYPECTOMY;  Surgeon: Irving Copas., MD;  Location: [facility];  Service: Gastroenterology;;   right finger surgery     as a child   SPHINCTEROTOMY  06/02/2022   Procedure: Joan Mayans;  Surgeon: Irving Copas., MD;  Location: [facility];  Service: Gastroenterology;;   TONSILLECTOMY     TOTAL KNEE ARTHROPLASTY Left 07/28/2017   Procedure: LEFT TOTAL KNEE ARTHROPLASTY;  Surgeon: Renette Butters, MD;  Location: [place];   Service: Orthopedics;  Laterality: Left;   TOTAL SHOULDER ARTHROPLASTY  05/29/2011   Procedure: TOTAL SHOULDER ARTHROPLASTY;  Surgeon: Metta Clines Supple;  Location: [place];  Service: Orthopedics;  Laterality: Right;   TOTAL SHOULDER ARTHROPLASTY Left 06/08/2014   DR SUPPLE   TOTAL SHOULDER ARTHROPLASTY Left 06/08/2014   Procedure: LEFT TOTAL SHOULDER ARTHROPLASTY;  Surgeon: Marin Shutter, MD;  Location: [place];  Service: Orthopedics;  Laterality: Left;   UMBILICAL HERNIA REPAIR N/A 03/11/2019   Procedure: LAPAROSCOPIC UMBILICAL HERNIA;  Surgeon: Ralene Ok, MD;  Location: [place];  Service: General;  Laterality: N/A;   No Known Allergies Prior to Admission medications   Medication Sig Start Date End Date Taking? Authorizing Provider  acetaminophen (TYLENOL) 500 MG tablet Take 2 tablets (1,000 mg total) by mouth 4 (four) times daily. 04/23/22   Jesusita Oka, MD  albuterol (VENTOLIN HFA) 108 (90 Base) MCG/ACT inhaler Inhale 2 puffs into the lungs every 6 (six) hours as needed for wheezing or shortness of breath. 06/03/22   Rai, Vernelle Emerald, MD  atorvastatin (LIPITOR) 40 MG tablet Take 1 tablet (40 mg total) by mouth daily. 01/20/22   Lacinda Axon, MD  carvedilol (COREG) 6.25 MG tablet Take 1 tablet (6.25 mg total) by mouth 2 (two) times daily with a meal. 01/20/22   Amponsah, Charisse March, MD  docusate sodium (COLACE) 100 MG capsule Take 1 capsule (100 mg total) by mouth 2 (two) times daily. 04/23/22   Jesusita Oka, MD  ePHEDrine  HCl (PRIMATENE) 12.5 MG TABS Take 12.5 mg by mouth daily as needed (wheezing).    [provider]  finasteride (PROSCAR) 5 MG tablet Take 1 tablet (5 mg total) by mouth daily. 01/21/22   Lacinda Axon, MD  Fluticasone Propionate (GOODSENSE 24-HR ALLERGY NASAL NA) Place 1 spray into the nose daily as needed (allergies).    [provider]  ibuprofen (ADVIL) 600 MG tablet Take 1 tablet (600 mg total) by mouth 4 (four) times daily. Patient  taking differently: Take 600 mg by mouth every 6 (six) hours as needed for moderate pain. 04/23/22   Jesusita Oka, MD  loratadine (CLARITIN) 10 MG tablet Take 10 mg by mouth daily as needed (wheezing).    [provider]  losartan (COZAAR) 50 MG tablet TAKE 1 TABLET EVERY DAY 04/30/22   Lelon Perla, MD  methocarbamol (ROBAXIN) 750 MG tablet Take 1 tablet (750 mg total) by mouth 4 (four) times daily. 04/23/22   Jesusita Oka, MD  pantoprazole (PROTONIX) 40 MG tablet Take 1 tablet (40 mg total) by mouth 2 (two) times daily before a meal. 06/02/22 06/02/23  Mansouraty, Telford Nab., MD  silodosin (RAPAFLO) 8 MG CAPS capsule Take 1 capsule (8 mg total) by mouth daily with breakfast. 02/27/22   Axel Filler, MD  sucralfate (CARAFATE) 1 g tablet Take 1 tablet (1 g total) by mouth 2 (two) times daily for 14 days. 06/02/22 06/18/22  Mansouraty, Telford Nab., MD  warfarin (COUMADIN) 5 MG tablet Take 0.5 tablets (2.5 mg total) by mouth daily. 06/03/22   Mansouraty, Telford Nab., MD   Social History   Socioeconomic History   Marital status: Married    Spouse name: Marjolin- Margie   Number of children: 2   Years of education: Not on file   Highest education level: Not on file  Occupational History   Occupation: Retired  Tobacco Use   Smoking status: Former    Packs/day: 1.50    Years: 42.00    Total pack years: 63.00    Types: Cigarettes    Start date: 1970    Quit date: 07/14/2010    Years since quitting: 11.9   Smokeless tobacco: Never   Tobacco comments:    quit smoking about 29yr ago  Vaping Use   Vaping Use: Never used  Substance and Sexual Activity   Alcohol use: No    Comment: no alcohol since 07   Drug use: No   Sexual activity: Never    Birth control/protection: None  Other Topics Concern   Not on file  SMercedeswith wife and dog.  Former CTeacher, early years/pre   Social Determinants of  Health   Financial Resource Strain: Low Risk  (06/04/2022)   Overall Financial Resource Strain (CARDIA)    Difficulty of Paying Living Expenses: Not hard at all  Food Insecurity: No Food Insecurity (06/03/2022)   Hunger Vital Sign    Worried About Running Out of Food in the Last Year: Never true    Ran Out of Food in the Last Year: Never true  Transportation Needs: No Transportation Needs (06/03/2022)   PRAPARE - THydrologist(Medical): No    Lack of Transportation (Non-Medical): No  Recent Concern: Transportation Needs - Unmet Transportation Needs (04/23/2022)   PRAPARE - THydrologist(Medical): Yes    Lack of Transportation (Non-Medical): Yes  Physical Activity: Not on file  Stress: Not on file  Social Connections: Not on file   Family History  Problem Relation Age of Onset   Bradycardia Mother 54       Requiring pacemeaker placement   Coronary artery disease Father 62       Died of myocardial infarction   Coronary artery disease Brother 38       Died of myocardial infarction   Obesity Daughter    Healthy Son    Colon cancer Neg Hx    Esophageal cancer Neg Hx    Stomach cancer Neg Hx    Colon polyps Neg Hx     ROS: Currently denies lightheadedness, dizziness, Fever, chills, CP, SOB.   No personal history of DVT, PE, MI, or CVA. Full dentures All other systems have been reviewed and were otherwise currently negative with the exception of those mentioned in the HPI and as above.  Objective: Vitals: Ht: 5'3" Wt: 157 lbs Temp: 97.8 BP: 138/75 Pulse: 62 O2 95% on room air.   Physical Exam: General: Alert, NAD.  Antalgic Gait  HEENT: EOMI, Good Neck Extension  Pulm: No increased work of breathing.  Clear B/L A/P w/o crackle or wheeze but does sound somewhat diminished like he can't fully inflate his lungs. CV: Irreg irreg R. High-pitched crescendo-decrescendo midsystolic ejection murmur heard best at the RUSB radiating  to the neck. Also has a decrescendo blowing diastolic murmur heard best at the LLSB GI: soft, NT, ND. BS x 4 quadrants Neuro: CN II-XII grossly intact without focal deficit.  Sensation intact distally Skin: No lesions in the area of chief complaint MSK/Surgical Site:  + JLT. ROM 0-110.  5/5 strength in extension and flexion.  +EHL/FHL.  NVI.  Stable varus and valgus stress.    Imaging Review Plain radiographs demonstrate severe degenerative joint disease of the right knee.   The overall alignment issignificant varus. The bone quality appears to be fair for age and reported activity level.  Preoperative templating of the joint replacement has been completed, documented, and submitted to the Operating Room personnel in order to optimize intra-operative equipment management.  Assessment: OA RIGHT KNEE Active Problems:   * No active hospital problems. *   Plan: Plan for Procedure(s): TOTAL KNEE ARTHROPLASTY  The patient history, physical exam, clinical judgement of the provider and imaging are consistent with end stage degenerative joint disease and total joint arthroplasty is deemed medically necessary. The treatment options including medical management, injection therapy, and arthroplasty were discussed at length. The risks and benefits of Procedure(s): TOTAL KNEE ARTHROPLASTY were presented and reviewed.  The risks of nonoperative treatment, versus surgical intervention including but not limited to continued pain, aseptic loosening, stiffness, dislocation/subluxation, infection, bleeding, nerve injury, blood clots, cardiopulmonary complications, morbidity, mortality, among others were discussed. The patient verbalizes understanding and wishes to proceed with the plan.  Patient is being admitted for inpatient treatment for surgery, pain control, PT, prophylactic antibiotics, VTE prophylaxis, progressive ambulation, ADL's and discharge planning. He will spend the night in observation.      The patient does meet the criteria for TXA which will be used perioperatively.   He will be restarted on his Coumadin POD 1 which will be used postoperatively for DVT prophylaxis in addition to SCDs, and early ambulation. Plan for Tylenol, Mobic, oxycodone for pain.   Robaxin for muscle spasms.   Zofran for nausea and vomiting. [place] The patient is planning to be discharged home with HHPT ([facility])  and into the care of his wife Marjoline who can be reached at [phone] Follow up appt 08/13/22 at 4:15pm     Alisa Graff Office [phone] 07/07/2022 2:40 PM

## 2022-07-08 ENCOUNTER — Telehealth: Payer: Self-pay | Admitting: Internal Medicine

## 2022-07-08 DIAGNOSIS — Z4689 Encounter for fitting and adjustment of other specified devices: Secondary | ICD-10-CM

## 2022-07-08 NOTE — Telephone Encounter (Signed)
Spoke to patient He needs EGD and stent removal at [facility] on 07/22/22  Please schedule - EGD w/ propofol   He does not need to hold warfarin  Encounter Diagnosis  Name Primary?   Encounter for removal of pancreatic stent Yes

## 2022-07-09 DIAGNOSIS — M25561 Pain in right knee: Secondary | ICD-10-CM | POA: Diagnosis not present

## 2022-07-09 NOTE — Telephone Encounter (Signed)
Left message for pt to call back  °

## 2022-07-10 ENCOUNTER — Other Ambulatory Visit: Payer: Self-pay

## 2022-07-10 DIAGNOSIS — K831 Obstruction of bile duct: Secondary | ICD-10-CM

## 2022-07-10 DIAGNOSIS — Z4689 Encounter for fitting and adjustment of other specified devices: Secondary | ICD-10-CM

## 2022-07-10 NOTE — Telephone Encounter (Signed)
Patient is returning your call.  

## 2022-07-10 NOTE — Telephone Encounter (Signed)
Pt was made aware of Dr. Carlean Purl recommendations: Pt was scheduled for the EGD on 07/22/2022 at 11:00 AM at [facility]: Pt to arrive at 9:30 AM. Pt made aware: Prep instructions  were created for pt and sent to pt via my chart ( pt given my chart help number) mailed to pt, and printed for pt and left on 2nd floor for pick up. Pt made aware Ambulatory referral to GI placed:  Pt verbalized understanding with all questions answered.

## 2022-07-15 ENCOUNTER — Encounter (HOSPITAL_COMMUNITY): Payer: Self-pay | Admitting: Internal Medicine

## 2022-07-16 ENCOUNTER — Ambulatory Visit: Payer: Medicare HMO | Attending: Cardiovascular Disease

## 2022-07-16 DIAGNOSIS — I4891 Unspecified atrial fibrillation: Secondary | ICD-10-CM | POA: Diagnosis not present

## 2022-07-16 DIAGNOSIS — Z5181 Encounter for therapeutic drug level monitoring: Secondary | ICD-10-CM

## 2022-07-16 LAB — POCT INR: INR: 2.1 (ref 2.0–3.0)

## 2022-07-16 NOTE — Patient Instructions (Signed)
Description   Continue taking warfarin 1/2 tablet (2.'5mg'$ ) daily - Hold Warfarin 5 day prior to procedure on 07/29/22. Restart Warfarin post procedure (or as instructed by Surgeon)  Remain consistent with leafy veggies (2-3 per week).  Recheck INR 1 week post procedure  C[facility] [phone]

## 2022-07-16 NOTE — Patient Instructions (Addendum)
SURGICAL WAITING ROOM VISITATION Patients having surgery or a procedure may have no more than 2 support people in the waiting area - these visitors may rotate.    If the patient needs to stay at the hospital during part of their recovery, the visitor guidelines for inpatient rooms apply. Pre-op nurse will coordinate an appropriate time for 1 support person to accompany patient in pre-op.  This support person may not rotate.    Please refer to the [facility] website for the visitor guidelines for Inpatients (after your surgery is over and you are in a regular room).   Due to an increase in RSV and influenza rates and associated hospitalizations, children ages 76 and under may not visit patients in [place].     Your procedure is scheduled on: 07-29-22   Report to [facility] Main Entrance    Report to admitting at 8:10AM   Call this number if you have problems the morning of surgery [phone]   Do not eat food :After Midnight.   After Midnight you may have the following liquids until 7:30 AM DAY OF SURGERY  Water Non-Citrus Juices (without pulp, NO RED) Carbonated Beverages Black Coffee (NO MILK/CREAM OR CREAMERS, sugar ok)  Clear Tea (NO MILK/CREAM OR CREAMERS, sugar ok) regular and decaf                             Plain Jell-O (NO RED)                                           Fruit ices (not with fruit pulp, NO RED)                                     Popsicles (NO RED)                                                               Sports drinks like Gatorade (NO RED)                   The day of surgery:  Drink ONE (1) Pre-Surgery G2 at 7:30 AM the morning of surgery. Drink in one sitting. Do not sip.  This drink was given to you during your hospital  pre-op appointment visit. Nothing else to drink after completing the Pre-Surgery  G2.          If you have questions, please contact your surgeon's office.   FOLLOW  ANY ADDITIONAL PRE OP  INSTRUCTIONS YOU RECEIVED FROM YOUR SURGEON'S OFFICE!!!     Oral Hygiene is also important to reduce your risk of infection.                                    Remember - BRUSH YOUR TEETH THE MORNING OF SURGERY WITH YOUR REGULAR TOOTHPASTE   Do NOT smoke after Midnight   Take these medicines the morning of surgery with A SIP OF WATER:   Atorvastatin  Carvedilol  Finasteride  Claritin  Pantoprazole  Silodosin  Tylenol if needed  DO NOT TAKE ANY ORAL DIABETIC MEDICATIONS DAY OF YOUR SURGERY  Bring CPAP mask and tubing day of surgery.                              You may not have any metal on your body including  jewelry, and body piercing             Do not wear  lotions, powders, cologne, or deodorant              Men may shave face and neck.   Do not bring valuables to the hospital. [place].   Contacts, dentures or bridgework may not be worn into surgery.   Bring small overnight bag day of surgery.   DO NOT [place]. PHARMACY WILL DISPENSE MEDICATIONS LISTED ON YOUR MEDICATION LIST TO YOU DURING YOUR ADMISSION [place]!    Special Instructions: Bring a copy of your healthcare power of attorney and living will documents the day of surgery if you haven't scanned them before.              Please read over the following fact sheets you were given: IF [place]  If you received a COVID test during your pre-op visit  it is requested that you wear a mask when out in public, stay away from anyone that may not be feeling well and notify your surgeon if you develop symptoms. If you test positive for Covid or have been in contact with anyone that has tested positive in the last 10 days please notify you surgeon.  [place] - Preparing for Surgery Before surgery, you can play an important role.  Because skin is not sterile, your skin  needs to be as free of germs as possible.  You can reduce the number of germs on your skin by washing with CHG (chlorahexidine gluconate) soap before surgery.  CHG is an antiseptic cleaner which kills germs and bonds with the skin to continue killing germs even after washing. Please DO NOT use if you have an allergy to CHG or antibacterial soaps.  If your skin becomes reddened/irritated stop using the CHG and inform your nurse when you arrive at Short Stay. Do not shave (including legs and underarms) for at least 48 hours prior to the first CHG shower.  You may shave your face/neck.  Please follow these instructions carefully:  1.  Shower with CHG Soap the night before surgery and the  morning of surgery.  2.  If you choose to wash your hair, wash your hair first as usual with your normal  shampoo.  3.  After you shampoo, rinse your hair and body thoroughly to remove the shampoo.                             4.  Use CHG as you would any other liquid soap.  You can apply chg directly to the skin and wash.  Gently with a scrungie or clean washcloth.  5.  Apply the CHG Soap to your body ONLY FROM THE NECK DOWN.   Do   not use on face/ open  Wound or open sores. Avoid contact with eyes, ears mouth and   genitals (private parts).                       Wash face,  Genitals (private parts) with your normal soap.             6.  Wash thoroughly, paying special attention to the area where your    surgery  will be performed.  7.  Thoroughly rinse your body with warm water from the neck down.  8.  DO NOT shower/wash with your normal soap after using and rinsing off the CHG Soap.                9.  Pat yourself dry with a clean towel.            10.  Wear clean pajamas.            11.  Place clean sheets on your bed the night of your first shower and do not  sleep with pets. Day of Surgery : Do not apply any lotions/deodorants the morning of surgery.  Please wear clean clothes to the  hospital/surgery center.  FAILURE TO FOLLOW THESE INSTRUCTIONS MAY RESULT IN THE CANCELLATION OF YOUR SURGERY  PATIENT SIGNATURE_________________________________  NURSE SIGNATURE__________________________________  ________________________________________________________________________    [name]  An incentive spirometer is a tool that can help keep your lungs clear and active. This tool measures how well you are filling your lungs with each breath. Taking long deep breaths may help reverse or decrease the chance of developing breathing (pulmonary) problems (especially infection) following: A long period of time when you are unable to move or be active. BEFORE THE PROCEDURE  If the spirometer includes an indicator to show your best effort, your nurse or respiratory therapist will set it to a desired goal. If possible, sit up straight or lean slightly forward. Try not to slouch. Hold the incentive spirometer in an upright position. INSTRUCTIONS FOR USE  Sit on the edge of your bed if possible, or sit up as far as you can in bed or on a chair. Hold the incentive spirometer in an upright position. Breathe out normally. Place the mouthpiece in your mouth and seal your lips tightly around it. Breathe in slowly and as deeply as possible, raising the piston or the ball toward the top of the column. Hold your breath for 3-5 seconds or for as long as possible. Allow the piston or ball to fall to the bottom of the column. Remove the mouthpiece from your mouth and breathe out normally. Rest for a few seconds and repeat Steps 1 through 7 at least 10 times every 1-2 hours when you are awake. Take your time and take a few normal breaths between deep breaths. The spirometer may include an indicator to show your best effort. Use the indicator as a goal to work toward during each repetition. After each set of 10 deep breaths, practice coughing to be sure your lungs are clear. If you have an  incision (the cut made at the time of surgery), support your incision when coughing by placing a pillow or rolled up towels firmly against it. Once you are able to get out of bed, walk around indoors and cough well. You may stop using the incentive spirometer when instructed by your caregiver.  RISKS AND COMPLICATIONS Take your time so you do not get dizzy or light-headed. If you are in pain,  you may need to take or ask for pain medication before doing incentive spirometry. It is harder to take a deep breath if you are having pain. AFTER USE Rest and breathe slowly and easily. It can be helpful to keep track of a log of your progress. Your caregiver can provide you with a simple table to help with this. If you are using the spirometer at home, follow these instructions: [place] IF:  You are having difficultly using the spirometer. You have trouble using the spirometer as often as instructed. Your pain medication is not giving enough relief while using the spirometer. You develop fever of 100.5 F (38.1 C) or higher. SEEK IMMEDIATE MEDICAL CARE IF:  You cough up bloody sputum that had not been present before. You develop fever of 102 F (38.9 C) or greater. You develop worsening pain at or near the incision site. MAKE SURE YOU:  Understand these instructions. Will watch your condition. Will get help right away if you are not doing well or get worse. Document Released: 10/27/2006 Document Revised: 09/08/2011 Document Reviewed: 12/28/2006 [facility] Patient Information 2014 [place], [place].   ________________________________________________________________________

## 2022-07-16 NOTE — Progress Notes (Signed)
COVID Vaccine Completed:  Yes  Date of COVID positive in last 90 days:  PCP - Lalla Brothers, MD Cardiologist - Kirk Ruths, MD  Cardiac clearance in Epic dated 06-25-22 by Venia Carbon, NP  Chest x-ray - 1V 06-02-22 EKG - 01-07-22 Epic Stress Test -  ECHO - 12-29-21 Epic Cardiac Cath -  Pacemaker/ICD device last checked: Spinal Cord Stimulator:  Bowel Prep -   Sleep Study -  CPAP -   Prediabetes Fasting Blood Sugar -  Checks Blood Sugar _____ times a day  Last dose of GLP1 agonist-  N/A GLP1 instructions:  N/A   Last dose of SGLT-2 inhibitors-  N/A SGLT-2 instructions: N/A   Blood Thinner Instructions: Coumadin.  Hold x5 days, do not take after 07-23-22 Aspirin Instructions: Last Dose:  Activity level:  Can go up a flight of stairs and perform activities of daily living without stopping and without symptoms of chest pain or shortness of breath.  Able to exercise without symptoms  Unable to go up a flight of stairs without symptoms of     Anesthesia review:  CAD, aortic stenosis, HTN, Afib, CHF, cardiomyopathy  Patient denies shortness of breath, fever, cough and chest pain at PAT appointment  Patient verbalized understanding of instructions that were given to them at the PAT appointment. Patient was also instructed that they will need to review over the PAT instructions again at home before surgery.

## 2022-07-17 ENCOUNTER — Encounter (HOSPITAL_COMMUNITY)
Admission: RE | Admit: 2022-07-17 | Discharge: 2022-07-17 | Disposition: A | Discharge status: Home / Self Care | Payer: Medicare HMO | Source: Ambulatory Visit | Attending: Anesthesiology | Admitting: Anesthesiology

## 2022-07-17 DIAGNOSIS — Z01818 Encounter for other preprocedural examination: Secondary | ICD-10-CM

## 2022-07-17 DIAGNOSIS — I2510 Atherosclerotic heart disease of native coronary artery without angina pectoris: Secondary | ICD-10-CM

## 2022-07-17 DIAGNOSIS — R7303 Prediabetes: Secondary | ICD-10-CM

## 2022-07-18 NOTE — Patient Instructions (Addendum)
DUE TO COVID-19 ONLY TWO VISITORS  (aged 75 and older)  ARE ALLOWED TO COME WITH YOU AND STAY IN THE WAITING ROOM ONLY DURING PRE OP AND PROCEDURE.   **NO VISITORS ARE ALLOWED IN THE SHORT STAY AREA OR RECOVERY ROOM!!**  IF YOU WILL BE ADMITTED INTO THE HOSPITAL YOU ARE ALLOWED ONLY FOUR SUPPORT PEOPLE DURING VISITATION HOURS ONLY (7 AM -8PM)   The support person(s) must pass our screening, gel in and out, and wear a mask at all times, including in the patient's room. Patients must also wear a mask when staff or their support person are in the room. Visitors GUEST BADGE MUST BE WORN VISIBLY  One adult visitor may remain with you overnight and MUST be in the room by 8 P.M.     Your procedure is scheduled on: 07/29/22   Report to [facility] Main Entrance    Report to admitting at : 8:10 AM   Call this number if you have problems the morning of surgery [phone]   Do not eat food :After Midnight.   After Midnight you may have the following liquids until : 7:30 AM DAY OF SURGERY  Water Black Coffee (sugar ok, NO MILK/CREAM OR CREAMERS)  Tea (sugar ok, NO MILK/CREAM OR CREAMERS) regular and decaf                             Plain Jell-O (NO RED)                                           Fruit ices (not with fruit pulp, NO RED)                                     Popsicles (NO RED)                                                                  Juice: apple, WHITE grape, WHITE cranberry Sports drinks like Gatorade (NO RED)    The day of surgery:  Drink ONE (1) Pre-Surgery Clear Ensure or G2 at : 7:30 AM the morning of surgery. Drink in one sitting. Do not sip.  This drink was given to you during your hospital  pre-op appointment visit. Nothing else to drink after completing the  Pre-Surgery Clear Ensure or G2.          If you have questions, please contact your surgeon's office.  Oral Hygiene is also important to reduce your risk of infection.                                     Remember - BRUSH YOUR TEETH THE MORNING OF SURGERY WITH YOUR REGULAR TOOTHPASTE  DENTURES WILL BE REMOVED PRIOR TO SURGERY PLEASE DO NOT APPLY "Poly grip" OR ADHESIVES!!!   Do NOT smoke after Midnight   Take these medicines the morning of surgery with A SIP OF WATER: carvedilol,finasteride,claritin,pantoprazole,silodosin.Tylenol as needed.  DO NOT  TAKE ANY ORAL DIABETIC MEDICATIONS DAY OF YOUR SURGERY  Bring CPAP mask and tubing day of surgery.                              You may not have any metal on your body including hair pins, jewelry, and body piercing             Do not wear lotions, powders, perfumes/cologne, or deodorant              Men may shave face and neck.   Do not bring valuables to the hospital. [place].   Contacts, glasses, or bridgework may not be worn into surgery.   Bring small overnight bag day of surgery.   DO NOT [place]. PHARMACY WILL DISPENSE MEDICATIONS LISTED ON YOUR MEDICATION LIST TO YOU DURING YOUR ADMISSION [place]!    Patients discharged on the day of surgery will not be allowed to drive home.  Someone NEEDS to stay with you for the first 24 hours after anesthesia.   Special Instructions: Bring a copy of your healthcare power of attorney and living will documents         the day of surgery if you haven't scanned them before.              Please read over the following fact sheets you were given: IF YOU HAVE QUESTIONS ABOUT YOUR PRE-OP INSTRUCTIONS PLEASE CALL [phone]    [facility] - Preparing for Surgery Before surgery, you can play an important role.  Because skin is not sterile, your skin needs to be as free of germs as possible.  You can reduce the number of germs on your skin by washing with CHG (chlorahexidine gluconate) soap before surgery.  CHG is an antiseptic cleaner which kills germs and bonds with the skin to continue killing  germs even after washing. Please DO NOT use if you have an allergy to CHG or antibacterial soaps.  If your skin becomes reddened/irritated stop using the CHG and inform your nurse when you arrive at Short Stay. Do not shave (including legs and underarms) for at least 48 hours prior to the first CHG shower.  You may shave your face/neck. Please follow these instructions carefully:  1.  Shower with CHG Soap the night before surgery and the  morning of Surgery.  2.  If you choose to wash your hair, wash your hair first as usual with your  normal  shampoo.  3.  After you shampoo, rinse your hair and body thoroughly to remove the  shampoo.                           4.  Use CHG as you would any other liquid soap.  You can apply chg directly  to the skin and wash                       Gently with a scrungie or clean washcloth.  5.  Apply the CHG Soap to your body ONLY FROM THE NECK DOWN.   Do not use on face/ open  Wound or open sores. Avoid contact with eyes, ears mouth and genitals (private parts).                       Wash face,  Genitals (private parts) with your normal soap.             6.  Wash thoroughly, paying special attention to the area where your surgery  will be performed.  7.  Thoroughly rinse your body with warm water from the neck down.  8.  DO NOT shower/wash with your normal soap after using and rinsing off  the CHG Soap.                9.  Pat yourself dry with a clean towel.            10.  Wear clean pajamas.            11.  Place clean sheets on your bed the night of your first shower and do not  sleep with pets. Day of Surgery : Do not apply any lotions/deodorants the morning of surgery.  Please wear clean clothes to the hospital/surgery center.  FAILURE TO FOLLOW THESE INSTRUCTIONS MAY RESULT IN THE CANCELLATION OF YOUR SURGERY PATIENT SIGNATURE_________________________________  NURSE  SIGNATURE__________________________________  ________________________________________________________________________  [name]  An incentive spirometer is a tool that can help keep your lungs clear and active. This tool measures how well you are filling your lungs with each breath. Taking long deep breaths may help reverse or decrease the chance of developing breathing (pulmonary) problems (especially infection) following: A long period of time when you are unable to move or be active. BEFORE THE PROCEDURE  If the spirometer includes an indicator to show your best effort, your nurse or respiratory therapist will set it to a desired goal. If possible, sit up straight or lean slightly forward. Try not to slouch. Hold the incentive spirometer in an upright position. INSTRUCTIONS FOR USE  Sit on the edge of your bed if possible, or sit up as far as you can in bed or on a chair. Hold the incentive spirometer in an upright position. Breathe out normally. Place the mouthpiece in your mouth and seal your lips tightly around it. Breathe in slowly and as deeply as possible, raising the piston or the ball toward the top of the column. Hold your breath for 3-5 seconds or for as long as possible. Allow the piston or ball to fall to the bottom of the column. Remove the mouthpiece from your mouth and breathe out normally. Rest for a few seconds and repeat Steps 1 through 7 at least 10 times every 1-2 hours when you are awake. Take your time and take a few normal breaths between deep breaths. The spirometer may include an indicator to show your best effort. Use the indicator as a goal to work toward during each repetition. After each set of 10 deep breaths, practice coughing to be sure your lungs are clear. If you have an incision (the cut made at the time of surgery), support your incision when coughing by placing a pillow or rolled up towels firmly against it. Once you are able to get out of  bed, walk around indoors and cough well. You may stop using the incentive spirometer when instructed by your caregiver.  RISKS AND COMPLICATIONS Take your time so you do not get dizzy or light-headed. If you are in pain, you may need to take or ask  for pain medication before doing incentive spirometry. It is harder to take a deep breath if you are having pain. AFTER USE Rest and breathe slowly and easily. It can be helpful to keep track of a log of your progress. Your caregiver can provide you with a simple table to help with this. If you are using the spirometer at home, follow these instructions: [place] IF:  You are having difficultly using the spirometer. You have trouble using the spirometer as often as instructed. Your pain medication is not giving enough relief while using the spirometer. You develop fever of 100.5 F (38.1 C) or higher. SEEK IMMEDIATE MEDICAL CARE IF:  You cough up bloody sputum that had not been present before. You develop fever of 102 F (38.9 C) or greater. You develop worsening pain at or near the incision site. MAKE SURE YOU:  Understand these instructions. Will watch your condition. Will get help right away if you are not doing well or get worse. Document Released: 10/27/2006 Document Revised: 09/08/2011 Document Reviewed: 12/28/2006 [facility] Patient Information 2014 [place], [place].   ________________________________________________________________________

## 2022-07-21 ENCOUNTER — Encounter (HOSPITAL_COMMUNITY)
Admission: RE | Admit: 2022-07-21 | Discharge: 2022-07-21 | Disposition: A | Discharge status: Home / Self Care | Payer: Medicare HMO | Source: Ambulatory Visit | Attending: Orthopedic Surgery | Admitting: Orthopedic Surgery

## 2022-07-21 ENCOUNTER — Telehealth: Payer: Self-pay | Admitting: *Deleted

## 2022-07-21 ENCOUNTER — Encounter (HOSPITAL_COMMUNITY): Payer: Self-pay

## 2022-07-21 ENCOUNTER — Telehealth: Payer: Self-pay | Admitting: Internal Medicine

## 2022-07-21 ENCOUNTER — Other Ambulatory Visit: Payer: Self-pay

## 2022-07-21 VITALS — BP 123/77 | HR 77 | Temp 97.6°F | Ht 66.0 in | Wt 143.0 lb

## 2022-07-21 DIAGNOSIS — R7303 Prediabetes: Secondary | ICD-10-CM | POA: Insufficient documentation

## 2022-07-21 DIAGNOSIS — E876 Hypokalemia: Secondary | ICD-10-CM

## 2022-07-21 DIAGNOSIS — Z01812 Encounter for preprocedural laboratory examination: Secondary | ICD-10-CM | POA: Insufficient documentation

## 2022-07-21 DIAGNOSIS — I4891 Unspecified atrial fibrillation: Secondary | ICD-10-CM

## 2022-07-21 DIAGNOSIS — Z01818 Encounter for other preprocedural examination: Secondary | ICD-10-CM

## 2022-07-21 DIAGNOSIS — I2510 Atherosclerotic heart disease of native coronary artery without angina pectoris: Secondary | ICD-10-CM | POA: Insufficient documentation

## 2022-07-21 HISTORY — DX: Prediabetes: R73.03

## 2022-07-21 LAB — BASIC METABOLIC PANEL
Anion gap: 12 (ref 5–15)
BUN: 16 mg/dL (ref 8–23)
CO2: 31 mmol/L (ref 22–32)
Calcium: 8.4 mg/dL — ABNORMAL LOW (ref 8.9–10.3)
Chloride: 98 mmol/L (ref 98–111)
Creatinine, Ser: 0.9 mg/dL (ref 0.61–1.24)
GFR, Estimated: >60 mL/min (ref >60)
Glucose, Bld: 71 mg/dL (ref 70–99)
Potassium: 2.4 mmol/L — CL (ref 3.5–5.1)
Sodium: 141 mmol/L (ref 135–145)

## 2022-07-21 LAB — CBC
HCT: 43 % (ref 39.0–52.0)
Hemoglobin: 13.5 g/dL (ref 13.0–17.0)
MCH: 29.1 pg (ref 26.0–34.0)
MCHC: 31.4 g/dL (ref 30.0–36.0)
MCV: 92.7 fL (ref 80.0–100.0)
Platelets: 183 10*3/uL (ref 150–400)
RBC: 4.64 MIL/uL (ref 4.22–5.81)
RDW: 17.1 % — ABNORMAL HIGH (ref 11.5–15.5)
WBC: 9.9 10*3/uL (ref 4.0–10.5)
nRBC: 0 % (ref 0.0–0.2)

## 2022-07-21 LAB — HEMOGLOBIN A1C
Hgb A1c MFr Bld: 5.8 % — ABNORMAL HIGH (ref 4.8–5.6)
Mean Plasma Glucose: 119.76 mg/dL

## 2022-07-21 LAB — MAGNESIUM: Magnesium: 1.5 mg/dL — ABNORMAL LOW (ref 1.7–2.4)

## 2022-07-21 LAB — SURGICAL PCR SCREEN
MRSA, PCR: NEGATIVE
Staphylococcus aureus: POSITIVE — AB

## 2022-07-21 LAB — GLUCOSE, CAPILLARY: Glucose-Capillary: 93 mg/dL (ref 70–99)

## 2022-07-21 MED ORDER — POTASSIUM CHLORIDE CRYS ER 20 MEQ PO TBCR: 40.0000 meq | EXTENDED_RELEASE_TABLET | Freq: Two times a day (BID) | ORAL | 0 refills | Status: DC

## 2022-07-21 NOTE — Telephone Encounter (Signed)
PCP has addressed

## 2022-07-21 NOTE — Telephone Encounter (Signed)
I received a critical lab result for hypokalemia on this patient, he had routine pre-op labs drawn today for planned total knee replacement next week. I spoke with the patient by phone, he is feeling well. Denies any weakness, no confusion, no palpitations. He has been eating and drinking well, no vomiting. He did restart Lasix one week ago, says it was from Dr. Stanford Breed which I think is causing the hypokalemia. He denies any symptoms of volume overload currently, his respiratory status is stable.   We talked about the need to stop Lasix now. Because he is asymptomatic I don't think he needs admission for potassium repletion. Rather, I will send in oral potassium supplement and he is to take 40 mEq twice daily for 7 days. I will try to add on a Magnesium to the labs drawn earlier today and will replete that if it is also depleted. I asked him to come to our clinic on Wed or Thurs this week for repeat labs.   I see he has an EGD planned for tomorrow to remove a pancreatic stent. I will message Dr. Carlean Purl to update him about this potassium. I am not sure what type of anesthesia they were planning for but this procedure may need to be delayed until hypokalemia improves.

## 2022-07-21 NOTE — Telephone Encounter (Signed)
Received a call from [facility].  Nurse stated patient was having pre-op testing for knee replacement done today and he is having an EGD at [facility] tomorrow.  Potassium level is 2.4.  She wanted to make you aware.  Thank you.

## 2022-07-21 NOTE — Addendum Note (Signed)
Addended by: Truddie Crumble on: 07/21/2022 03:14 PM   Modules accepted: Orders

## 2022-07-21 NOTE — Telephone Encounter (Signed)
Received following message from [facility], PA-C:  Hi, This pt was seen today for preop labs. Critical Potassium of 2.4. I have also forwarded labs to surgeon, but wanted to make sure this PCP is aware for further steps before upcoming surgery.

## 2022-07-21 NOTE — Progress Notes (Signed)
Left message for [facility] Dr. Celesta Aver nurse. Pt. Was at preop for a future surgery and K+ was 2.4. Routed this to Dr. Carlean Purl also pt. Is for EGD 07-22-22.

## 2022-07-21 NOTE — Progress Notes (Addendum)
igned      COVID Vaccine Completed:  Yes   Date of COVID positive in last 90 days:   PCP - Lalla Brothers, MD Cardiologist - Kirk Ruths, MD   Cardiac clearance in Epic dated 06-25-22 by Venia Carbon, NP   Chest x-ray - 1V 06-02-22 EKG - 01-07-22 Epic Stress Test -  ECHO - 12-29-21 Epic Cardiac Cath -  Pacemaker/ICD device last checked: Spinal Cord Stimulator:   Bowel Prep -    Sleep Study -  CPAP -    Prediabetes Fasting Blood Sugar -  Checks Blood Sugar _____ times a day   Last dose of GLP1 agonist-  N/A GLP1 instructions:  N/A   Last dose of SGLT-2 inhibitors-  N/A SGLT-2 instructions: N/A     Blood Thinner Instructions: Coumadin.  Hold x5 days, do not take after 07-23-22 Aspirin Instructions: Last Dose:   Activity level:   Can go up a flight of stairs and perform activities of daily living without stopping and without symptoms of chest pain or shortness of breath.   Able to exercise without symptoms                       Anesthesia review:  CAD, aortic stenosis, HTN, Afib, CHF, cardiomyopathy   Patient denies shortness of breath, fever, cough and chest pain at PAT appointment   Patient verbalized understanding of instructions that were given to them at the PAT appointment. Patient was also instructed that they will need to review over the PAT instructions again at home before surgery.

## 2022-07-21 NOTE — Progress Notes (Signed)
PCR: + STAPH °

## 2022-07-21 NOTE — Telephone Encounter (Signed)
PCP notified via pager

## 2022-07-22 ENCOUNTER — Encounter (HOSPITAL_COMMUNITY): Payer: Self-pay | Admitting: Internal Medicine

## 2022-07-22 ENCOUNTER — Ambulatory Visit (HOSPITAL_COMMUNITY)
Admission: RE | Admit: 2022-07-22 | Discharge: 2022-07-22 | Disposition: A | Discharge status: Home / Self Care | Payer: Medicare HMO | Attending: Internal Medicine | Admitting: Internal Medicine

## 2022-07-22 ENCOUNTER — Ambulatory Visit (HOSPITAL_COMMUNITY): Payer: Medicare HMO | Admitting: Anesthesiology

## 2022-07-22 ENCOUNTER — Encounter (HOSPITAL_COMMUNITY)
Admission: RE | Disposition: A | Discharge status: Home / Self Care | Payer: Self-pay | Source: Home / Self Care | Attending: Internal Medicine

## 2022-07-22 ENCOUNTER — Ambulatory Visit (HOSPITAL_BASED_OUTPATIENT_CLINIC_OR_DEPARTMENT_OTHER): Payer: Medicare HMO | Admitting: Anesthesiology

## 2022-07-22 DIAGNOSIS — J449 Chronic obstructive pulmonary disease, unspecified: Secondary | ICD-10-CM

## 2022-07-22 DIAGNOSIS — I2510 Atherosclerotic heart disease of native coronary artery without angina pectoris: Secondary | ICD-10-CM

## 2022-07-22 DIAGNOSIS — E785 Hyperlipidemia, unspecified: Secondary | ICD-10-CM | POA: Diagnosis not present

## 2022-07-22 DIAGNOSIS — I110 Hypertensive heart disease with heart failure: Secondary | ICD-10-CM | POA: Diagnosis not present

## 2022-07-22 DIAGNOSIS — I509 Heart failure, unspecified: Secondary | ICD-10-CM | POA: Insufficient documentation

## 2022-07-22 DIAGNOSIS — Z87891 Personal history of nicotine dependence: Secondary | ICD-10-CM | POA: Insufficient documentation

## 2022-07-22 DIAGNOSIS — M1990 Unspecified osteoarthritis, unspecified site: Secondary | ICD-10-CM | POA: Diagnosis not present

## 2022-07-22 DIAGNOSIS — K831 Obstruction of bile duct: Secondary | ICD-10-CM | POA: Diagnosis not present

## 2022-07-22 DIAGNOSIS — G709 Myoneural disorder, unspecified: Secondary | ICD-10-CM | POA: Diagnosis not present

## 2022-07-22 DIAGNOSIS — I480 Paroxysmal atrial fibrillation: Secondary | ICD-10-CM | POA: Diagnosis not present

## 2022-07-22 DIAGNOSIS — Z4689 Encounter for fitting and adjustment of other specified devices: Secondary | ICD-10-CM

## 2022-07-22 DIAGNOSIS — Z4659 Encounter for fitting and adjustment of other gastrointestinal appliance and device: Secondary | ICD-10-CM

## 2022-07-22 DIAGNOSIS — I4891 Unspecified atrial fibrillation: Secondary | ICD-10-CM | POA: Insufficient documentation

## 2022-07-22 DIAGNOSIS — Z79899 Other long term (current) drug therapy: Secondary | ICD-10-CM | POA: Diagnosis not present

## 2022-07-22 HISTORY — PX: PANCREATIC STENT PLACEMENT: SHX5539

## 2022-07-22 HISTORY — PX: ESOPHAGOGASTRODUODENOSCOPY (EGD) WITH PROPOFOL: SHX5813

## 2022-07-22 SURGERY — ESOPHAGOGASTRODUODENOSCOPY (EGD) WITH PROPOFOL
Anesthesia: Monitor Anesthesia Care

## 2022-07-22 MED ORDER — PROPOFOL 10 MG/ML IV BOLUS
INTRAVENOUS | Status: AC
  Filled 2022-07-22: qty 20

## 2022-07-22 MED ORDER — LACTATED RINGERS IV SOLN: INTRAVENOUS | Status: DC | PRN

## 2022-07-22 MED ORDER — LIDOCAINE HCL (CARDIAC) PF 100 MG/5ML IV SOSY
PREFILLED_SYRINGE | INTRAVENOUS | Status: DC | PRN
  Administered 2022-07-22: 80 mg via INTRAVENOUS

## 2022-07-22 MED ORDER — SODIUM CHLORIDE 0.9 % IV SOLN: INTRAVENOUS | Status: DC

## 2022-07-22 MED ORDER — PHENYLEPHRINE HCL (PRESSORS) 10 MG/ML IV SOLN
INTRAVENOUS | Status: DC | PRN
  Administered 2022-07-22: 160 ug via INTRAVENOUS
  Administered 2022-07-22: 80 ug via INTRAVENOUS

## 2022-07-22 MED ORDER — PROPOFOL 500 MG/50ML IV EMUL
INTRAVENOUS | Status: DC | PRN
  Administered 2022-07-22: 50 mg via INTRAVENOUS
  Administered 2022-07-22: 135 ug/kg/min via INTRAVENOUS

## 2022-07-22 MED ORDER — PROPOFOL 500 MG/50ML IV EMUL
INTRAVENOUS | Status: AC
  Filled 2022-07-22: qty 50

## 2022-07-22 MED ORDER — MAGNESIUM OXIDE 400 MG PO TABS: 400.0000 mg | ORAL_TABLET | Freq: Two times a day (BID) | ORAL | 0 refills | Status: DC

## 2022-07-22 SURGICAL SUPPLY — 15 items

## 2022-07-22 NOTE — Telephone Encounter (Signed)
Add on labs also show a mild hypomagnesemia. Will prescribe Max oxide '400mg'$  to be taken twice a day if tolerable for at least 5 days. This should help with repletion of potassium. I think we will likely get follow up labs today at patient's EGD procedure. Can also get labs on Thursday in our office.

## 2022-07-22 NOTE — Anesthesia Postprocedure Evaluation (Signed)
Anesthesia Post Note  Patient: [name]  Procedure(s) Performed: ESOPHAGOGASTRODUODENOSCOPY (EGD) WITH PROPOFOL PANCREATIC STENT REMOVAL     Patient location during evaluation: PACU Anesthesia Type: MAC Level of consciousness: awake and alert and oriented Pain management: pain level controlled Vital Signs Assessment: post-procedure vital signs reviewed and stable Respiratory status: spontaneous breathing, nonlabored ventilation and respiratory function stable Cardiovascular status: stable and blood pressure returned to baseline Postop Assessment: no apparent nausea or vomiting Anesthetic complications: no   No notable events documented.  Last Vitals:  Vitals:   07/22/22 1200 07/22/22 1210  BP: (!) 127/59 (!) 135/52  Pulse:  76  Resp: 16 (!) 23  Temp:    SpO2:  100%    Last Pain:  Vitals:   07/22/22 1210  TempSrc:   PainSc: 0-No pain                 Field Staniszewski A.

## 2022-07-22 NOTE — Transfer of Care (Signed)
Immediate Anesthesia Transfer of Care Note  Patient: GAR GLANCE  Procedure(s) Performed: Procedure(s): ESOPHAGOGASTRODUODENOSCOPY (EGD) WITH PROPOFOL (N/A) PANCREATIC STENT REMOVAL  Patient Location: PACU  Anesthesia Type:MAC  Level of Consciousness:  sedated, patient cooperative and responds to stimulation  Airway & Oxygen Therapy:Patient Spontanous Breathing and Patient connected to face mask oxgen  Post-op Assessment:  Report given to PACU RN and Post -op Vital signs reviewed and stable  Post vital signs:  Reviewed and stable  Last Vitals:  Vitals:   07/22/22 1030 07/22/22 1143  BP: (!) 140/66 (!) 93/45  Pulse: 79 79  Resp: 19 (!) 32  Temp: (!) 36.2 C   SpO2: 48% 472%    Complications: No apparent anesthesia complications

## 2022-07-22 NOTE — Discharge Instructions (Signed)
I removed the stent.   Please see me as needed - if you get abdominal pains, jaundice (hope not) then contact me.  Good luck with knee surgery.  I appreciate the opportunity to care for you. Gatha Mayer, MD, FACG  YOU HAD AN ENDOSCOPIC PROCEDURE TODAY: Refer to the procedure report and other information in the discharge instructions given to you for any specific questions about what was found during the examination. If this information does not answer your questions, please call Dr. Celesta Aver office at [phone] to clarify.   YOU SHOULD EXPECT: Some feelings of bloating in the abdomen. Passage of more gas than usual. Walking can help get rid of the air that was put into your GI tract during the procedure and reduce the bloating. If you had a lower endoscopy (such as a colonoscopy or flexible sigmoidoscopy) you may notice spotting of blood in your stool or on the toilet paper. Some abdominal soreness may be present for a day or two, also.  DIET: Your first meal following the procedure should be a light meal and then it is ok to progress to your normal diet. A half-sandwich or bowl of soup is an example of a good first meal. Heavy or fried foods are harder to digest and may make you feel nauseous or bloated. Drink plenty of fluids but you should avoid alcoholic beverages for 24 hours.   ACTIVITY: Your care partner should take you home directly after the procedure. You should plan to take it easy, moving slowly for the rest of the day. You can resume normal activity the day after the procedure however YOU SHOULD NOT DRIVE, use power tools, machinery or perform tasks that involve climbing or major physical exertion for 24 hours (because of the sedation medicines used during the test).   SYMPTOMS TO REPORT IMMEDIATELY: A gastroenterologist can be reached at any hour. Please call [phone]  for any of the following symptoms:   Following upper endoscopy (EGD, EUS, ERCP, esophageal  dilation) Vomiting of blood or coffee ground material  New, significant abdominal pain  New, significant chest pain or pain under the shoulder blades  Painful or persistently difficult swallowing  New shortness of breath  Black, tarry-looking or red, bloody stools  FOLLOW UP:  If any biopsies were taken you will be contacted by phone or by letter within the next 1-3 weeks. Call [phone]  if you have not heard about the biopsies in 3 weeks.  Please also call with any specific questions about appointments or follow up tests.

## 2022-07-22 NOTE — Care Plan (Signed)
Ortho Bundle Case Management Note  Patient Details  Name: [name] MRN: [id number] Date of Birth: 09-15-1945  patient seen in the office today for H&P. will discharge to home with family to assist. has rolling walker. CPM ordered. HHPT referral to [facility]. OPPT set up with [facility] discharge instructions discussed and given to patient. CM spoke with him this afternoon and answered questions. Patient and MD in agreement with plan. Choice offered                      DME Arranged:  Walker rolling, CPM DME Agency:  [employer]  HH Arranged:  PT HH Agency:  [place]  Additional Comments: Please contact me with any questions of if this plan should need to change.  Ladell Heads,  [name]  [phone] 07/22/2022, 3:36 PM

## 2022-07-22 NOTE — Op Note (Signed)
[facility] Patient Name: [name] Procedure Date: 07/22/2022 MRN: [id number] Attending MD: Gatha Mayer , MD, [id number] Date of Birth: 04-22-1946 CSN: [id number] Age: 77 Admit Type: Outpatient Procedure:                Upper GI endoscopy Indications:              Pancreatic stent removal Providers:                Gatha Mayer, MD, Jaci Carrel, RN, Cherylynn Ridges, Technician, Arnoldo Hooker, CRNA Referring MD:              Medicines:                Monitored Anesthesia Care Complications:            No immediate complications. Estimated Blood Loss:     Estimated blood loss was minimal. Procedure:                Pre-Anesthesia Assessment:                           - Prior to the procedure, a History and Physical                            was performed, and patient medications and                            allergies were reviewed. The patient's tolerance of                            previous anesthesia was also reviewed. The risks                            and benefits of the procedure and the sedation                            options and risks were discussed with the patient.                            All questions were answered, and informed consent                            was obtained. Prior Anticoagulants: The patient                            last took Coumadin (warfarin) 1 day prior to the                            procedure. ASA Grade Assessment: III - A patient                            with severe systemic disease. After reviewing the  risks and benefits, the patient was deemed in                            satisfactory condition to undergo the procedure.                           After obtaining informed consent, the endoscope was                            passed under direct vision. Throughout the                            procedure, the patient's blood pressure, pulse, and                             oxygen saturations were monitored continuously. The                            [id number]-[id number] ([id number]) Olympus endoscope was introduced                            through the mouth, and advanced to the second part                            of duodenum. The [id number]-[id number] ([id number]) Olympus                            duodenoscope was introduced through the and                            advanced to the. The upper GI endoscopy was                            somewhat difficult. The patient tolerated the                            procedure well. Scope In: Scope Out: Findings:      Esophagus normal      Stomach - clip in gastric body from prior polypectomy      Duodenum - 2 clips at papilla - did not see stent - periampullary       diverticulum seen. Changed to duodenoscope and diverticulum seen better       and suspected the stent was there. Attempted removal w/ rat-tooth       forceps and snare unsuccessful. Changed back to gastroscope and stent       removed w/ rat-tooth forceps. Impression:               - No specimens collected.                           - Plastic stent in the duodenum w/in periampullary                            diverticulum Removed. Moderate Sedation:      Not Applicable -  Patient had care per Anesthesia. Recommendation:           - Patient has a contact number available for                            emergencies. The signs and symptoms of potential                            delayed complications were discussed with the                            patient. Return to normal activities tomorrow.                            Written discharge instructions were provided to the                            patient.                           - Resume previous diet.                           - Continue present medications.                           - Resume Coumadin (warfarin) at prior dose today.                           - Return to my office PRN. Procedure  Code(s):        --- Professional ---                           [phone], Esophagogastroduodenoscopy, flexible,                            transoral; with removal of foreign body(s) Diagnosis Code(s):        --- Professional ---                           Z46.59, Encounter for fitting and adjustment of                            other gastrointestinal appliance and device CPT copyright 2022 American Medical Association. All rights reserved. The codes documented in this report are preliminary and upon coder review may  be revised to meet current compliance requirements. Gatha Mayer, MD 07/22/2022 11:51:56 AM This report has been signed electronically. Number of Addenda: 0

## 2022-07-22 NOTE — Anesthesia Preprocedure Evaluation (Signed)
Anesthesia Evaluation  Patient identified by MRN, date of birth, ID band Patient awake    Reviewed: Allergy & Precautions, NPO status , Patient's Chart, lab work & pertinent test results, reviewed documented beta blocker date and time   History of Anesthesia Complications Negative for: history of anesthetic complications  Airway Mallampati: II  TM Distance: >3 FB Neck ROM: Full    Dental  (+) Edentulous Upper, Edentulous Lower   Pulmonary COPD, former smoker   Pulmonary exam normal breath sounds clear to auscultation       Cardiovascular hypertension, Pt. on medications and Pt. on home beta blockers + CAD and +CHF  Normal cardiovascular exam+ dysrhythmias Atrial Fibrillation  Rhythm:Regular Rate:Normal  TTE 12/2021: EF 60-65%, moderate LAE, mild MR, mild AR, moderate AS    Neuro/Psych  Neuromuscular disease  negative psych ROS   GI/Hepatic Neg liver ROS, PUD,,,S/P pancreatic stent placement   Endo/Other  negative endocrine ROS    Renal/GU negative Renal ROS  negative genitourinary   Musculoskeletal  (+) Arthritis , Osteoarthritis,    Abdominal   Peds  Hematology  (+) Blood dyscrasia Coumadin therapy   Anesthesia Other Findings   Reproductive/Obstetrics                              Anesthesia Physical Anesthesia Plan  ASA: 3  Anesthesia Plan: MAC   Post-op Pain Management: Minimal or no pain anticipated   Induction: Intravenous  PONV Risk Score and Plan: 2 and Treatment may vary due to age or medical condition and Propofol infusion  Airway Management Planned: Natural Airway and Nasal Cannula  Additional Equipment: None  Intra-op Plan:   Post-operative Plan:   Informed Consent: I have reviewed the patients History and Physical, chart, labs and discussed the procedure including the risks, benefits and alternatives for the proposed anesthesia with the patient or authorized  representative who has indicated his/her understanding and acceptance.     Dental advisory given  Plan Discussed with: CRNA and Anesthesiologist  Anesthesia Plan Comments:          Anesthesia Quick Evaluation

## 2022-07-22 NOTE — H&P (Signed)
[facility] History and Physical   Primary Care Physician:  Axel Filler, MD   Reason for Procedure:   EGD  Plan:    Remove stent     HPI: [name] is a 77 y.o. male w/ choldocholithiasia dn s/p ERCP with pancreatic stent placement, and stent needs to be removed due to retention.  Lab Results  Component Value Date   CREATININE 0.90 07/21/2022   BUN 16 07/21/2022   NA 141 07/21/2022   K 2.4 (LL) 07/21/2022   CL 98 07/21/2022   CO2 31 07/21/2022   K supplements started yesterday and furosemide held by Dr. Damita Dunnings  Past Medical History:  Diagnosis Date   Allergic rhinitis 08/08/2013   takes Loratadine daily    Aortic stenosis 09/08/2011   With mild aortic regurgitation, mean gradient 13 mmHg    Cataract    right but immature   CHF (congestive heart failure) ([place]) 4403   Complication of anesthesia    stopped breathing - during shoulder surgery 2009-2010   Coronary artery disease 10/02/2009   Cardiac cath (October 2007): 20% left main, 60% mid LAD, 60-70% distal LAD, 30-40% first diagonal, 30% circumflex, 40% OM, 30-40% RCA    Degenerative joint disease of shoulder 08/03/2013   Bilateral, s/p right total shoulder arthroplasty 05/29/2011 and left shoulder arthroplasty 47/42/5956   Diastolic dysfunction 38/75/6433   Echo (04/04/2016): Grade I   Diverticulosis 10/07/2013   Seen on colonoscopy in 2007    Erectile dysfunction 05/07/2009   Essential hypertension 08/03/2013   had been on Lisinopril but stopped per MD   First degree AV block 03/14/2016   Gastric ulcer 10/07/2013   Seen on EGD 04/10/2006    Hemorrhoids 10/07/2013   Hyperlipidemia 10/02/2009   Paroxysmal atrial fibrillation ([place]) 07/14/2006   One episode, provoked by alcohol use disorder, briefly anticoagulated with warfarin, then switched to aspirin alone   Pre-diabetes    Sciatica associated with disorder of lumbar spine 08/03/2013   Anterolisthesis with right L 4-5 nerve  root compression.  Treated with epidural injections and Physical Therapy.    Seborrheic keratosis 10/07/2013   Tubular adenoma of colon     Past Surgical History:  Procedure Laterality Date   BIOPSY  06/02/2022   Procedure: BIOPSY;  Surgeon: Rush Landmark Telford Nab., MD;  Location: [facility];  Service: Gastroenterology;;   CARDIAC CATHETERIZATION  2007   CARDIOVERSION     CARDIOVERSION N/A 08/04/2019   Procedure: CARDIOVERSION;  Surgeon: Fay Records, MD;  Location: [facility];  Service: Cardiovascular;  Laterality: N/A;   CHOLECYSTECTOMY N/A 04/21/2022   Procedure: LAPAROSCOPIC CHOLECYSTECTOMY;  Surgeon: Jesusita Oka, MD;  Location: [place];  Service: General;  Laterality: N/A;   COLONOSCOPY     CYSTOSCOPY N/A 01/04/2022   Procedure: Consuela Mimes;  Surgeon: Raynelle Bring, MD;  Location: [place];  Service: Urology;  Laterality: N/A;   ENDOSCOPIC RETROGRADE CHOLANGIOPANCREATOGRAPHY (ERCP) WITH PROPOFOL N/A 06/02/2022   Procedure: ENDOSCOPIC RETROGRADE CHOLANGIOPANCREATOGRAPHY (ERCP) WITH PROPOFOL;  Surgeon: Rush Landmark Telford Nab., MD;  Location: [facility];  Service: Gastroenterology;  Laterality: N/A;   ERCP N/A 04/21/2022   Procedure: ATTEMPTED ENDOSCOPIC RETROGRADE CHOLANGIOPANCREATOGRAPHY (ERCP);  Surgeon: Gatha Mayer, MD;  Location: [place];  Service: Gastroenterology;  Laterality: N/A;   ESOPHAGOGASTRODUODENOSCOPY (EGD) WITH PROPOFOL N/A 06/02/2022   Procedure: ESOPHAGOGASTRODUODENOSCOPY (EGD) WITH PROPOFOL;  Surgeon: Rush Landmark Telford Nab., MD;  Location: [facility];  Service: Gastroenterology;  Laterality: N/A;   EYE SURGERY Bilateral 2022   bilateral cataracts  HEMOSTASIS CLIP PLACEMENT  06/02/2022   Procedure: HEMOSTASIS CLIP PLACEMENT;  Surgeon: Irving Copas., MD;  Location: [facility];  Service: Gastroenterology;;   INSERTION OF MESH N/A 03/11/2019   Procedure: Insertion Of Mesh;  Surgeon: Ralene Ok, MD;  Location: [place];  Service: General;   Laterality: N/A;   INTRAOPERATIVE CHOLANGIOGRAM N/A 04/21/2022   Procedure: INTRAOPERATIVE CHOLANGIOGRAM;  Surgeon: Jesusita Oka, MD;  Location: [place];  Service: General;  Laterality: N/A;   IR EXCHANGE BILIARY DRAIN  02/14/2022   IR PERC CHOLECYSTOSTOMY  12/29/2021   JOINT REPLACEMENT     KNEE ARTHROSCOPY WITH MENISCAL REPAIR Right 01/30/2011   LAPAROTOMY N/A 01/04/2022   Procedure: EXPLORATORY LAPAROTOMY WITH EXTRAPERITONEAL BLADDER REPAIR;  Surgeon: Raynelle Bring, MD;  Location: [place];  Service: Urology;  Laterality: N/A;   PANCREATIC STENT PLACEMENT  06/02/2022   Procedure: PANCREATIC STENT PLACEMENT;  Surgeon: Irving Copas., MD;  Location: [facility];  Service: Gastroenterology;;   POLYPECTOMY  06/02/2022   Procedure: POLYPECTOMY;  Surgeon: Irving Copas., MD;  Location: [facility];  Service: Gastroenterology;;   right finger surgery     as a child   SPHINCTEROTOMY  06/02/2022   Procedure: Joan Mayans;  Surgeon: Irving Copas., MD;  Location: [facility];  Service: Gastroenterology;;   TONSILLECTOMY     TOTAL KNEE ARTHROPLASTY Left 07/28/2017   Procedure: LEFT TOTAL KNEE ARTHROPLASTY;  Surgeon: Renette Butters, MD;  Location: [place];  Service: Orthopedics;  Laterality: Left;   TOTAL SHOULDER ARTHROPLASTY  05/29/2011   Procedure: TOTAL SHOULDER ARTHROPLASTY;  Surgeon: Metta Clines Supple;  Location: [place];  Service: Orthopedics;  Laterality: Right;   TOTAL SHOULDER ARTHROPLASTY Left 06/08/2014   DR SUPPLE   TOTAL SHOULDER ARTHROPLASTY Left 06/08/2014   Procedure: LEFT TOTAL SHOULDER ARTHROPLASTY;  Surgeon: Marin Shutter, MD;  Location: [place];  Service: Orthopedics;  Laterality: Left;   UMBILICAL HERNIA REPAIR N/A 03/11/2019   Procedure: LAPAROSCOPIC UMBILICAL HERNIA;  Surgeon: Ralene Ok, MD;  Location: [place];  Service: General;  Laterality: N/A;    Prior to Admission medications   Medication Sig Start Date End Date Taking? Authorizing  Provider  acetaminophen (TYLENOL) 500 MG tablet Take 2 tablets (1,000 mg total) by mouth 4 (four) times daily. Patient taking differently: Take 1,000 mg by mouth every 6 (six) hours as needed for moderate pain. 04/23/22  Yes Jesusita Oka, MD  atorvastatin (LIPITOR) 40 MG tablet Take 1 tablet (40 mg total) by mouth daily. 01/20/22  Yes Lacinda Axon, MD  carvedilol (COREG) 6.25 MG tablet Take 1 tablet (6.25 mg total) by mouth 2 (two) times daily with a meal. 01/20/22  Yes Amponsah, Charisse March, MD  docusate sodium (COLACE) 100 MG capsule Take 1 capsule (100 mg total) by mouth 2 (two) times daily. 04/23/22  Yes Lovick, Montel Culver, MD  ePHEDrine HCl (PRIMATENE) 12.5 MG TABS Take 12.5 mg by mouth daily as needed (wheezing).   Yes [provider]  finasteride (PROSCAR) 5 MG tablet Take 1 tablet (5 mg total) by mouth daily. 01/21/22  Yes Lacinda Axon, MD  ibuprofen (ADVIL) 600 MG tablet Take 1 tablet (600 mg total) by mouth 4 (four) times daily. Patient taking differently: Take 600 mg by mouth every 6 (six) hours as needed for moderate pain. 04/23/22  Yes Jesusita Oka, MD  loratadine (CLARITIN) 10 MG tablet Take 10 mg by mouth daily as needed (wheezing).   Yes [provider]  losartan (COZAAR)  50 MG tablet TAKE 1 TABLET EVERY DAY 04/30/22  Yes Crenshaw, Denice Bors, MD  potassium chloride SA (KLOR-CON M) 20 MEQ tablet Take 2 tablets (40 mEq total) by mouth 2 (two) times daily for 7 days. 07/21/22 07/28/22 Yes Axel Filler, MD  silodosin (RAPAFLO) 8 MG CAPS capsule Take 1 capsule (8 mg total) by mouth daily with breakfast. 02/27/22  Yes Axel Filler, MD  warfarin (COUMADIN) 5 MG tablet Take 0.5 tablets (2.5 mg total) by mouth daily. 06/03/22  Yes Mansouraty, Telford Nab., MD  albuterol (VENTOLIN HFA) 108 (90 Base) MCG/ACT inhaler Inhale 2 puffs into the lungs every 6 (six) hours as needed for wheezing or shortness of breath. 06/03/22   Rai, Ripudeep K, MD  magnesium  oxide (MAG-OX) 400 MG tablet Take 1 tablet (400 mg total) by mouth 2 (two) times daily for 5 days. 07/22/22 07/27/22  Axel Filler, MD  methocarbamol (ROBAXIN) 750 MG tablet Take 1 tablet (750 mg total) by mouth 4 (four) times daily. Patient not taking: Reported on 07/14/2022 04/23/22   Jesusita Oka, MD  pantoprazole (PROTONIX) 40 MG tablet Take 1 tablet (40 mg total) by mouth 2 (two) times daily before a meal. Patient not taking: Reported on 07/14/2022 06/02/22 06/02/23  Mansouraty, Telford Nab., MD  sucralfate (CARAFATE) 1 g tablet Take 1 tablet (1 g total) by mouth 2 (two) times daily for 14 days. Patient not taking: Reported on 07/14/2022 06/02/22 06/18/22  Mansouraty, Telford Nab., MD    Current Facility-Administered Medications  Medication Dose Route Frequency Provider Last Rate Last Admin   0.9 %  sodium chloride infusion   Intravenous Continuous Gatha Mayer, MD       Facility-Administered Medications Ordered in Other Encounters  Medication Dose Route Frequency Provider Last Rate Last Admin   lactated ringers infusion   Intravenous Continuous PRN Key, Kristopher, CRNA   New Bag at 07/22/22 1050    Allergies as of 07/10/2022   (No Known Allergies)    Family History  Problem Relation Age of Onset   Bradycardia Mother 43       Requiring pacemeaker placement   Coronary artery disease Father 15       Died of myocardial infarction   Coronary artery disease Brother 44       Died of myocardial infarction   Obesity Daughter    Healthy Son    Colon cancer Neg Hx    Esophageal cancer Neg Hx    Stomach cancer Neg Hx    Colon polyps Neg Hx     Social History   Socioeconomic History   Marital status: Married    Spouse name: MarjolinRetail banker   Number of children: 2   Years of education: Not on file   Highest education level: Not on file  Occupational History   Occupation: Retired  Tobacco Use   Smoking status: Former    Packs/day: 1.50    Years: 42.00    Total pack  years: 63.00    Types: Cigarettes    Start date: 1970    Quit date: 07/14/2010    Years since quitting: 12.0   Smokeless tobacco: Never   Tobacco comments:    quit smoking about 33yr ago  Vaping Use   Vaping Use: Never used  Substance and Sexual Activity   Alcohol use: Not Currently    Comment: no alcohol since 07   Drug use: No   Sexual activity: Never    Birth control/protection: None  Other Topics Concern   Not on file  [place] with wife and dog.  Former Teacher, early years/pre.   Social Determinants of Health   Financial Resource Strain: Low Risk  (06/04/2022)   Overall Financial Resource Strain (CARDIA)    Difficulty of Paying Living Expenses: Not hard at all  Food Insecurity: No Food Insecurity (06/03/2022)   Hunger Vital Sign    Worried About Running Out of Food in the Last Year: Never true    Ran Out of Food in the Last Year: Never true  Transportation Needs: No Transportation Needs (06/03/2022)   PRAPARE - Hydrologist (Medical): No    Lack of Transportation (Non-Medical): No  Recent Concern: Transportation Needs - Unmet Transportation Needs (04/23/2022)   PRAPARE - Hydrologist (Medical): Yes    Lack of Transportation (Non-Medical): Yes  Physical Activity: Not on file  Stress: Not on file  Social Connections: Not on file  Intimate Partner Violence: Not At Risk (06/03/2022)   Humiliation, Afraid, Rape, and Kick questionnaire    Fear of Current or Ex-Partner: No    Emotionally Abused: No    Physically Abused: No    Sexually Abused: No    Review of Systems:  All other review of systems negative except as mentioned in the HPI.  Physical Exam: Vital signs BP (!) 140/66   Pulse 79   Temp (!) 97.2 F (36.2 C) (Temporal)   Resp 19   Ht '5\' 6"'$  (1.676 m)   Wt 64.9 kg   SpO2 96%   BMI 23.08 kg/m   General:   Alert chron ill elderly  wm Lungs:  diffuse;y decreased BS Heart:  Regular rate and rhythm; no murmurs, clicks, rubs,  or gallops. Distant sounds Abdomen:  Soft, nontender and nondistended. Normal bowel sounds.   Neuro/Psych:  Alert and cooperative. Normal mood and affect. A and O x 3   '@[name]'$  Simonne Maffucci, MD, [facility] [phone] (pager) 07/22/2022 10:58 AM@

## 2022-07-22 NOTE — Addendum Note (Signed)
Addended by: Lalla Brothers T on: 07/22/2022 09:59 AM   Modules accepted: Orders

## 2022-07-22 NOTE — Telephone Encounter (Signed)
Noted  

## 2022-07-23 ENCOUNTER — Other Ambulatory Visit: Payer: Medicare HMO

## 2022-07-23 ENCOUNTER — Encounter (HOSPITAL_COMMUNITY): Payer: Self-pay | Admitting: Internal Medicine

## 2022-07-23 LAB — POCT I-STAT, CHEM 8
BUN: 12 mg/dL (ref 8–23)
Calcium, Ion: 1.17 mmol/L (ref 1.15–1.40)
Chloride: 100 mmol/L (ref 98–111)
Creatinine, Ser: 0.8 mg/dL (ref 0.61–1.24)
Glucose, Bld: 91 mg/dL (ref 70–99)
HCT: 41 % (ref 39.0–52.0)
Hemoglobin: 13.9 g/dL (ref 13.0–17.0)
Potassium: 3.2 mmol/L — ABNORMAL LOW (ref 3.5–5.1)
Sodium: 144 mmol/L (ref 135–145)
TCO2: 29 mmol/L (ref 22–32)

## 2022-07-23 NOTE — Telephone Encounter (Signed)
I followed up with the patient by phone this morning. He had EGD yesterday with successful removal of the biliary stent and is doing well today. No complications. He has been taking oral potassium, stopped lasix. Potassium yesterday is better at 3.2. He picked up oral Magnesium yesterday and will start it today. I instructed him to take potassium for just one more day then stop, continue to hold lasix, continue magnesium. He will come into my office tomorrow for another lab check. I anticipate potassium will be improved enough that he can safely undergo knee surgery on Monday.

## 2022-07-24 ENCOUNTER — Other Ambulatory Visit (INDEPENDENT_AMBULATORY_CARE_PROVIDER_SITE_OTHER): Payer: Medicare HMO

## 2022-07-24 DIAGNOSIS — E876 Hypokalemia: Secondary | ICD-10-CM

## 2022-07-24 LAB — BASIC METABOLIC PANEL
Anion gap: 10 (ref 5–15)
BUN: 9 mg/dL (ref 8–23)
CO2: 26 mmol/L (ref 22–32)
Calcium: 8.9 mg/dL (ref 8.9–10.3)
Chloride: 106 mmol/L (ref 98–111)
Creatinine, Ser: 1.02 mg/dL (ref 0.61–1.24)
GFR, Estimated: >60 mL/min (ref >60)
Glucose, Bld: 182 mg/dL — ABNORMAL HIGH (ref 70–99)
Potassium: 4.2 mmol/L (ref 3.5–5.1)
Sodium: 142 mmol/L (ref 135–145)

## 2022-07-24 LAB — MAGNESIUM: Magnesium: 1.8 mg/dL (ref 1.7–2.4)

## 2022-07-24 NOTE — Telephone Encounter (Signed)
I spoke with [name] about blood work. His potassium and magnesium have now returned to normal. I instructed him to continue holding lasix, and to discontinue the supplemental potassium and magnesium at this time. In the future, if he restarts lasix I anticipate he will need to combine it with potassium supplementation. He seems well optimized at this point to go forward with the total knee arthroplasty next week. He has already started to hold coumadin in preparation for that surgery.

## 2022-07-24 NOTE — Addendum Note (Signed)
Addended by: Truddie Crumble on: 07/24/2022 08:29 AM   Modules accepted: Orders

## 2022-07-24 NOTE — Addendum Note (Signed)
Addended by: Lalla Brothers T on: 07/24/2022 10:42 AM   Modules accepted: Orders

## 2022-07-25 NOTE — Anesthesia Preprocedure Evaluation (Signed)
Anesthesia Evaluation  Patient identified by MRN, date of birth, ID band Patient awake    Reviewed: Allergy & Precautions, NPO status , Patient's Chart, lab work & pertinent test results  Airway Mallampati: I  TM Distance: >3 FB Neck ROM: Full    Dental  (+) Edentulous Upper, Edentulous Lower   Pulmonary COPD,  COPD inhaler, former smoker   Pulmonary exam normal        Cardiovascular hypertension, Pt. on home beta blockers and Pt. on medications + CAD and +CHF  + dysrhythmias Atrial Fibrillation + Valvular Problems/Murmurs (Moderate AS) AS  Rhythm:Regular Rate:Normal + Systolic murmurs    Neuro/Psych  Neuromuscular disease  negative psych ROS   GI/Hepatic Neg liver ROS, PUD,,,  Endo/Other  negative endocrine ROS    Renal/GU negative Renal ROS     Musculoskeletal  (+) Arthritis ,    Abdominal   Peds  Hematology  (+) Blood dyscrasia (On Warfarin)   Anesthesia Other Findings OA RIGHT KNEE  Reproductive/Obstetrics                             Anesthesia Physical Anesthesia Plan  ASA: 3  Anesthesia Plan: General and Regional   Post-op Pain Management:    Induction: Intravenous  PONV Risk Score and Plan: 2 and Ondansetron, Dexamethasone, Propofol infusion and Treatment may vary due to age or medical condition  Airway Management Planned: LMA  Additional Equipment:   Intra-op Plan:   Post-operative Plan: Extubation in OR  Informed Consent: I have reviewed the patients History and Physical, chart, labs and discussed the procedure including the risks, benefits and alternatives for the proposed anesthesia with the patient or authorized representative who has indicated his/her understanding and acceptance.       Plan Discussed with: CRNA  Anesthesia Plan Comments: (PAT note 07/25/2022)       Anesthesia Quick Evaluation

## 2022-07-25 NOTE — Progress Notes (Signed)
Anesthesia Chart Review   Case: [id number] Date/Time: 07/29/22 1025   Procedure: TOTAL KNEE ARTHROPLASTY (Right: Knee)   Anesthesia type: Spinal   Pre-op diagnosis: OA RIGHT KNEE   Location: [facility] ROOM 08 / [facility]   Surgeons: Renette Butters, MD       DISCUSSION:76 y.o. former smoker with h/o HTN, PAF, CAD, CHF, moderate aortic stenosis (mean gradient 17.3 mmHg, valve area 1.11 cm2), right knee OA scheduled for above procedure 07/29/2022 with Dr. Edmonia Lynch.   Critical potassium at PAT visit, potassium 2.4. PCP and surgeon made aware.  Pt underwent EGD and pancreatic stent removal 07/22/2022.   Potassium 4.2 on 07/24/22.   Per PCP Dr. Evette Doffing:  I spoke with [name] about blood work. His potassium and magnesium have now returned to normal. I instructed him to continue holding lasix, and to discontinue the supplemental potassium and magnesium at this time. In the future, if he restarts lasix I anticipate he will need to combine it with potassium supplementation. He seems well optimized at this point to go forward with the total knee arthroplasty next week. He has already started to hold coumadin in preparation for that surgery.   Pt last seen by cardiology 06/25/2022. Per OV note, "According to the Revised Cardiac Risk Index (RCRI), his Perioperative Risk of Major Cardiac Event is (%): 0.4 His Functional Capacity in METs is: 6.05 according to the Duke Activity Status Index (DASI).  Therefore, based on ACC/AHA guidelines, patient would be at acceptable risk for the planned procedure without further cardiovascular testing. The patient was advised that if he develops new symptoms prior to surgery to contact our office to arrange for a follow-up visit, and he verbalized understanding. Per office protocol, patient can hold warfarin for 5 days prior to procedure.  Patient will not need bridging with Lovenox (enoxaparin) around procedure."  Pt reports last dose of Coumadin 07/23/22.    Anticipate pt can proceed with planned procedure barring acute status change.   VS: There were no vitals taken for this visit.  PROVIDERS: Axel Filler, MD is PCP   Primary Cardiologist:  Kirk Ruths, MD  LABS: Labs reviewed: Acceptable for surgery. (all labs ordered are listed, but only abnormal results are displayed)  Labs Reviewed - No data to display   IMAGES:   EKG:   CV: Echo 12/29/2021  1. Left ventricular ejection fraction, by estimation, is 60 to 65%. The  left ventricle has normal function. The left ventricle has no regional  wall motion abnormalities. Left ventricular diastolic parameters are  indeterminate.   2. Right ventricular systolic function is normal. The right ventricular  size is normal.   3. Left atrial size was moderately dilated.   4. The mitral valve is normal in structure. Mild mitral valve  regurgitation. No evidence of mitral stenosis.   5. The aortic valve is calcified. Aortic valve regurgitation is mild.  Moderate aortic valve stenosis.   Comparison(s): No significant change from prior study.  Past Medical History:  Diagnosis Date   Allergic rhinitis 08/08/2013   takes Loratadine daily    Aortic stenosis 09/08/2011   With mild aortic regurgitation, mean gradient 13 mmHg    Cataract    right but immature   CHF (congestive heart failure) ([place]) 2841   Complication of anesthesia    stopped breathing - during shoulder surgery 2009-2010   Coronary artery disease 10/02/2009   Cardiac cath (October 2007): 20% left main, 60% mid LAD, 60-70% distal LAD, 30-40%  first diagonal, 30% circumflex, 40% OM, 30-40% RCA    Degenerative joint disease of shoulder 08/03/2013   Bilateral, s/p right total shoulder arthroplasty 05/29/2011 and left shoulder arthroplasty 09/32/6712   Diastolic dysfunction 45/80/9983   Echo (04/04/2016): Grade I   Diverticulosis 10/07/2013   Seen on colonoscopy in 2007    Erectile dysfunction 05/07/2009   Essential  hypertension 08/03/2013   had been on Lisinopril but stopped per MD   First degree AV block 03/14/2016   Gastric ulcer 10/07/2013   Seen on EGD 04/10/2006    Hemorrhoids 10/07/2013   Hyperlipidemia 10/02/2009   Paroxysmal atrial fibrillation ([place]) 07/14/2006   One episode, provoked by alcohol use disorder, briefly anticoagulated with warfarin, then switched to aspirin alone   Pre-diabetes    Sciatica associated with disorder of lumbar spine 08/03/2013   Anterolisthesis with right L 4-5 nerve root compression.  Treated with epidural injections and Physical Therapy.    Seborrheic keratosis 10/07/2013   Tubular adenoma of colon     Past Surgical History:  Procedure Laterality Date   BIOPSY  06/02/2022   Procedure: BIOPSY;  Surgeon: Rush Landmark Telford Nab., MD;  Location: [facility];  Service: Gastroenterology;;   CARDIAC CATHETERIZATION  2007   CARDIOVERSION     CARDIOVERSION N/A 08/04/2019   Procedure: CARDIOVERSION;  Surgeon: Fay Records, MD;  Location: [facility];  Service: Cardiovascular;  Laterality: N/A;   CHOLECYSTECTOMY N/A 04/21/2022   Procedure: LAPAROSCOPIC CHOLECYSTECTOMY;  Surgeon: Jesusita Oka, MD;  Location: [place];  Service: General;  Laterality: N/A;   COLONOSCOPY     CYSTOSCOPY N/A 01/04/2022   Procedure: Consuela Mimes;  Surgeon: Raynelle Bring, MD;  Location: [place];  Service: Urology;  Laterality: N/A;   ENDOSCOPIC RETROGRADE CHOLANGIOPANCREATOGRAPHY (ERCP) WITH PROPOFOL N/A 06/02/2022   Procedure: ENDOSCOPIC RETROGRADE CHOLANGIOPANCREATOGRAPHY (ERCP) WITH PROPOFOL;  Surgeon: Rush Landmark Telford Nab., MD;  [facility];  Service: Gastroenterology;  Laterality: N/A;   ERCP N/A 04/21/2022   Procedure: ATTEMPTED ENDOSCOPIC RETROGRADE CHOLANGIOPANCREATOGRAPHY (ERCP);  Surgeon: Gatha Mayer, MD;  Location: [place];  Service: Gastroenterology;  Laterality: N/A;   ESOPHAGOGASTRODUODENOSCOPY (EGD) WITH PROPOFOL N/A 06/02/2022   Procedure:  ESOPHAGOGASTRODUODENOSCOPY (EGD) WITH PROPOFOL;  Surgeon: Rush Landmark Telford Nab., MD;  [facility];  Service: Gastroenterology;  Laterality: N/A;   ESOPHAGOGASTRODUODENOSCOPY (EGD) WITH PROPOFOL N/A 07/22/2022   Procedure: ESOPHAGOGASTRODUODENOSCOPY (EGD) WITH PROPOFOL;  Surgeon: Gatha Mayer, MD;  [facility];  Service: Gastroenterology;  Laterality: N/A;   EYE SURGERY Bilateral 2022   bilateral cataracts   HEMOSTASIS CLIP PLACEMENT  06/02/2022   Procedure: HEMOSTASIS CLIP PLACEMENT;  Surgeon: Irving Copas., MD;  Location: [facility];  Service: Gastroenterology;;   INSERTION OF MESH N/A 03/11/2019   Procedure: Insertion Of Mesh;  Surgeon: Ralene Ok, MD;  Location: [place];  Service: General;  Laterality: N/A;   INTRAOPERATIVE CHOLANGIOGRAM N/A 04/21/2022   Procedure: INTRAOPERATIVE CHOLANGIOGRAM;  Surgeon: Jesusita Oka, MD;  Location: [place];  Service: General;  Laterality: N/A;   IR EXCHANGE BILIARY DRAIN  02/14/2022   IR PERC CHOLECYSTOSTOMY  12/29/2021   JOINT REPLACEMENT     KNEE ARTHROSCOPY WITH MENISCAL REPAIR Right 01/30/2011   LAPAROTOMY N/A 01/04/2022   Procedure: EXPLORATORY LAPAROTOMY WITH EXTRAPERITONEAL BLADDER REPAIR;  Surgeon: Raynelle Bring, MD;  Location: [place];  Service: Urology;  Laterality: N/A;   PANCREATIC STENT PLACEMENT  06/02/2022   Procedure: PANCREATIC STENT PLACEMENT;  Surgeon: Rush Landmark Telford Nab., MD;  [facility];  Service: Gastroenterology;;  PANCREATIC STENT PLACEMENT  07/22/2022   Procedure: PANCREATIC STENT REMOVAL;  Surgeon: Gatha Mayer, MD;  Location: [facility];  Service: Gastroenterology;;   POLYPECTOMY  06/02/2022   Procedure: POLYPECTOMY;  Surgeon: Irving Copas., MD;  Location: [facility];  Service: Gastroenterology;;   right finger surgery     as a child   SPHINCTEROTOMY  06/02/2022   Procedure: Joan Mayans;  Surgeon: Irving Copas., MD;  Location: [facility];   Service: Gastroenterology;;   TONSILLECTOMY     TOTAL KNEE ARTHROPLASTY Left 07/28/2017   Procedure: LEFT TOTAL KNEE ARTHROPLASTY;  Surgeon: Renette Butters, MD;  Location: [place];  Service: Orthopedics;  Laterality: Left;   TOTAL SHOULDER ARTHROPLASTY  05/29/2011   Procedure: TOTAL SHOULDER ARTHROPLASTY;  Surgeon: Metta Clines Supple;  Location: [place];  Service: Orthopedics;  Laterality: Right;   TOTAL SHOULDER ARTHROPLASTY Left 06/08/2014   DR SUPPLE   TOTAL SHOULDER ARTHROPLASTY Left 06/08/2014   Procedure: LEFT TOTAL SHOULDER ARTHROPLASTY;  Surgeon: Marin Shutter, MD;  Location: [place];  Service: Orthopedics;  Laterality: Left;   UMBILICAL HERNIA REPAIR N/A 03/11/2019   Procedure: LAPAROSCOPIC UMBILICAL HERNIA;  Surgeon: Ralene Ok, MD;  Location: [place];  Service: General;  Laterality: N/A;    MEDICATIONS: No current facility-administered medications for this encounter.    acetaminophen (TYLENOL) 500 MG tablet   albuterol (VENTOLIN HFA) 108 (90 Base) MCG/ACT inhaler   atorvastatin (LIPITOR) 40 MG tablet   carvedilol (COREG) 6.25 MG tablet   docusate sodium (COLACE) 100 MG capsule   ePHEDrine HCl (PRIMATENE) 12.5 MG TABS   finasteride (PROSCAR) 5 MG tablet   loratadine (CLARITIN) 10 MG tablet   losartan (COZAAR) 50 MG tablet   silodosin (RAPAFLO) 8 MG CAPS capsule   warfarin (COUMADIN) 5 MG tablet    [facility], PA-C [facility] Pre-Surgical Testing [phone]

## 2022-07-29 ENCOUNTER — Observation Stay (HOSPITAL_COMMUNITY): Payer: Medicare HMO

## 2022-07-29 ENCOUNTER — Observation Stay (HOSPITAL_COMMUNITY)
Admission: RE | Admit: 2022-07-29 | Discharge: 2022-07-30 | Disposition: A | Discharge status: Home Health | Payer: Medicare HMO | Attending: Orthopedic Surgery | Admitting: Orthopedic Surgery

## 2022-07-29 ENCOUNTER — Other Ambulatory Visit: Payer: Self-pay

## 2022-07-29 ENCOUNTER — Encounter (HOSPITAL_COMMUNITY)
Admission: RE | Disposition: A | Discharge status: Home Health | Payer: Self-pay | Source: Home / Self Care | Attending: Orthopedic Surgery

## 2022-07-29 ENCOUNTER — Ambulatory Visit (HOSPITAL_BASED_OUTPATIENT_CLINIC_OR_DEPARTMENT_OTHER): Payer: Medicare HMO | Admitting: Physician Assistant

## 2022-07-29 ENCOUNTER — Encounter (HOSPITAL_COMMUNITY): Payer: Self-pay | Admitting: Orthopedic Surgery

## 2022-07-29 ENCOUNTER — Ambulatory Visit (HOSPITAL_COMMUNITY): Payer: Medicare HMO | Admitting: Physician Assistant

## 2022-07-29 DIAGNOSIS — Z87891 Personal history of nicotine dependence: Secondary | ICD-10-CM | POA: Diagnosis not present

## 2022-07-29 DIAGNOSIS — Z955 Presence of coronary angioplasty implant and graft: Secondary | ICD-10-CM | POA: Insufficient documentation

## 2022-07-29 DIAGNOSIS — I509 Heart failure, unspecified: Secondary | ICD-10-CM | POA: Insufficient documentation

## 2022-07-29 DIAGNOSIS — Z96652 Presence of left artificial knee joint: Secondary | ICD-10-CM | POA: Diagnosis not present

## 2022-07-29 DIAGNOSIS — Z79899 Other long term (current) drug therapy: Secondary | ICD-10-CM | POA: Diagnosis not present

## 2022-07-29 DIAGNOSIS — I4891 Unspecified atrial fibrillation: Secondary | ICD-10-CM

## 2022-07-29 DIAGNOSIS — I110 Hypertensive heart disease with heart failure: Secondary | ICD-10-CM | POA: Insufficient documentation

## 2022-07-29 DIAGNOSIS — Z471 Aftercare following joint replacement surgery: Secondary | ICD-10-CM | POA: Diagnosis not present

## 2022-07-29 DIAGNOSIS — I2510 Atherosclerotic heart disease of native coronary artery without angina pectoris: Secondary | ICD-10-CM

## 2022-07-29 DIAGNOSIS — R7303 Prediabetes: Secondary | ICD-10-CM

## 2022-07-29 DIAGNOSIS — Z7952 Long term (current) use of systemic steroids: Secondary | ICD-10-CM | POA: Insufficient documentation

## 2022-07-29 DIAGNOSIS — Z96612 Presence of left artificial shoulder joint: Secondary | ICD-10-CM | POA: Insufficient documentation

## 2022-07-29 DIAGNOSIS — M1711 Unilateral primary osteoarthritis, right knee: Principal | ICD-10-CM | POA: Insufficient documentation

## 2022-07-29 DIAGNOSIS — G8918 Other acute postprocedural pain: Secondary | ICD-10-CM | POA: Diagnosis not present

## 2022-07-29 DIAGNOSIS — J449 Chronic obstructive pulmonary disease, unspecified: Secondary | ICD-10-CM

## 2022-07-29 DIAGNOSIS — I480 Paroxysmal atrial fibrillation: Secondary | ICD-10-CM | POA: Diagnosis not present

## 2022-07-29 DIAGNOSIS — Z96651 Presence of right artificial knee joint: Secondary | ICD-10-CM | POA: Diagnosis not present

## 2022-07-29 HISTORY — PX: TOTAL KNEE ARTHROPLASTY: SHX125

## 2022-07-29 LAB — PROTIME-INR
INR: 1.3 — ABNORMAL HIGH (ref 0.8–1.2)
Prothrombin Time: 15.8 seconds — ABNORMAL HIGH (ref 11.4–15.2)

## 2022-07-29 LAB — APTT: aPTT: 32 seconds (ref 24–36)

## 2022-07-29 SURGERY — ARTHROPLASTY, KNEE, TOTAL
Anesthesia: Regional | Site: Knee | Laterality: Right

## 2022-07-29 MED ORDER — LOSARTAN POTASSIUM 50 MG PO TABS
50.0000 mg | ORAL_TABLET | Freq: Every day | ORAL | Status: DC
  Administered 2022-07-29 – 2022-07-30 (×2): 50 mg via ORAL
  Filled 2022-07-29 (×2): qty 1

## 2022-07-29 MED ORDER — MIDAZOLAM HCL 2 MG/2ML IJ SOLN
INTRAMUSCULAR | Status: AC
  Filled 2022-07-29: qty 2

## 2022-07-29 MED ORDER — CEFAZOLIN SODIUM-DEXTROSE 1-4 GM/50ML-% IV SOLN
1.0000 g | Freq: Four times a day (QID) | INTRAVENOUS | Status: AC
  Administered 2022-07-29 (×2): 1 g via INTRAVENOUS
  Filled 2022-07-29 (×2): qty 50

## 2022-07-29 MED ORDER — FINASTERIDE 5 MG PO TABS
5.0000 mg | ORAL_TABLET | Freq: Every day | ORAL | Status: DC
  Administered 2022-07-30: 5 mg via ORAL
  Filled 2022-07-29: qty 1

## 2022-07-29 MED ORDER — WATER FOR IRRIGATION, STERILE IR SOLN
Status: DC | PRN
  Administered 2022-07-29: 2000 mL

## 2022-07-29 MED ORDER — PHENYLEPHRINE HCL-NACL 20-0.9 MG/250ML-% IV SOLN
INTRAVENOUS | Status: DC | PRN
  Administered 2022-07-29: 40 ug/min via INTRAVENOUS

## 2022-07-29 MED ORDER — LACTATED RINGERS IV SOLN: INTRAVENOUS | Status: DC

## 2022-07-29 MED ORDER — PANTOPRAZOLE SODIUM 40 MG PO TBEC
40.0000 mg | DELAYED_RELEASE_TABLET | Freq: Every day | ORAL | Status: DC
  Administered 2022-07-30: 40 mg via ORAL
  Filled 2022-07-29 (×2): qty 1

## 2022-07-29 MED ORDER — PHENOL 1.4 % MT LIQD: 1.0000 | OROMUCOSAL | Status: DC | PRN

## 2022-07-29 MED ORDER — METHOCARBAMOL 500 MG IVPB - SIMPLE MED: 500.0000 mg | Freq: Four times a day (QID) | INTRAVENOUS | Status: DC | PRN

## 2022-07-29 MED ORDER — HYDROMORPHONE HCL 1 MG/ML IJ SOLN: 0.5000 mg | INTRAMUSCULAR | Status: DC | PRN

## 2022-07-29 MED ORDER — SODIUM CHLORIDE 0.9% FLUSH
INTRAVENOUS | Status: DC | PRN
  Administered 2022-07-29: 30 mL

## 2022-07-29 MED ORDER — BUPIVACAINE HCL (PF) 0.25 % IJ SOLN
INTRAMUSCULAR | Status: DC | PRN
  Administered 2022-07-29: 30 mL

## 2022-07-29 MED ORDER — SODIUM CHLORIDE 0.9 % IR SOLN
Status: DC | PRN
  Administered 2022-07-29: 1000 mL

## 2022-07-29 MED ORDER — OXYCODONE HCL 5 MG PO TABS: 5.0000 mg | ORAL_TABLET | ORAL | Status: DC | PRN

## 2022-07-29 MED ORDER — METOCLOPRAMIDE HCL 5 MG/ML IJ SOLN: 5.0000 mg | Freq: Three times a day (TID) | INTRAMUSCULAR | Status: DC | PRN

## 2022-07-29 MED ORDER — TAMSULOSIN HCL 0.4 MG PO CAPS
0.4000 mg | ORAL_CAPSULE | Freq: Every day | ORAL | Status: DC
  Administered 2022-07-30: 0.4 mg via ORAL
  Filled 2022-07-29: qty 1

## 2022-07-29 MED ORDER — LORATADINE 10 MG PO TABS: 10.0000 mg | ORAL_TABLET | Freq: Every day | ORAL | Status: DC | PRN

## 2022-07-29 MED ORDER — POVIDONE-IODINE 10 % EX SWAB
2.0000 | Freq: Once | CUTANEOUS | Status: AC
  Administered 2022-07-29: 2 via TOPICAL

## 2022-07-29 MED ORDER — DIPHENHYDRAMINE HCL 12.5 MG/5ML PO ELIX: 12.5000 mg | ORAL_SOLUTION | ORAL | Status: DC | PRN

## 2022-07-29 MED ORDER — ACETAMINOPHEN 500 MG PO TABS
1000.0000 mg | ORAL_TABLET | Freq: Once | ORAL | Status: AC
  Administered 2022-07-29: 1000 mg via ORAL
  Filled 2022-07-29: qty 2

## 2022-07-29 MED ORDER — BUPIVACAINE LIPOSOME 1.3 % IJ SUSP
INTRAMUSCULAR | Status: AC
  Filled 2022-07-29: qty 20

## 2022-07-29 MED ORDER — MAGNESIUM CITRATE PO SOLN: 1.0000 | Freq: Once | ORAL | Status: DC | PRN

## 2022-07-29 MED ORDER — ORAL CARE MOUTH RINSE: 15.0000 mL | Freq: Once | OROMUCOSAL | Status: AC

## 2022-07-29 MED ORDER — ONDANSETRON HCL 4 MG/2ML IJ SOLN
INTRAMUSCULAR | Status: DC | PRN
  Administered 2022-07-29: 4 mg via INTRAVENOUS

## 2022-07-29 MED ORDER — POLYETHYLENE GLYCOL 3350 17 G PO PACK: 17.0000 g | PACK | Freq: Every day | ORAL | Status: DC | PRN

## 2022-07-29 MED ORDER — ONDANSETRON HCL 4 MG/2ML IJ SOLN
INTRAMUSCULAR | Status: AC
  Filled 2022-07-29: qty 2

## 2022-07-29 MED ORDER — BISACODYL 10 MG RE SUPP: 10.0000 mg | Freq: Every day | RECTAL | Status: DC | PRN

## 2022-07-29 MED ORDER — BUPIVACAINE-EPINEPHRINE (PF) 0.5% -1:200000 IJ SOLN
INTRAMUSCULAR | Status: AC
  Filled 2022-07-29: qty 1.8

## 2022-07-29 MED ORDER — BUPIVACAINE-EPINEPHRINE (PF) 0.5% -1:200000 IJ SOLN
INTRAMUSCULAR | Status: DC | PRN
  Administered 2022-07-29: 30 mL via PERINEURAL

## 2022-07-29 MED ORDER — SODIUM CHLORIDE (PF) 0.9 % IJ SOLN
INTRAMUSCULAR | Status: AC
  Filled 2022-07-29: qty 50

## 2022-07-29 MED ORDER — MIDAZOLAM HCL 2 MG/2ML IJ SOLN
1.0000 mg | INTRAMUSCULAR | Status: DC
  Administered 2022-07-29: 1 mg via INTRAVENOUS
  Filled 2022-07-29: qty 2

## 2022-07-29 MED ORDER — BUPIVACAINE HCL 0.25 % IJ SOLN
INTRAMUSCULAR | Status: AC
  Filled 2022-07-29: qty 1

## 2022-07-29 MED ORDER — CEFAZOLIN SODIUM-DEXTROSE 2-4 GM/100ML-% IV SOLN
2.0000 g | INTRAVENOUS | Status: AC
  Administered 2022-07-29: 2 g via INTRAVENOUS
  Filled 2022-07-29: qty 100

## 2022-07-29 MED ORDER — TRANEXAMIC ACID-NACL 1000-0.7 MG/100ML-% IV SOLN
1000.0000 mg | INTRAVENOUS | Status: AC
  Administered 2022-07-29: 1000 mg via INTRAVENOUS
  Filled 2022-07-29: qty 100

## 2022-07-29 MED ORDER — ALUM & MAG HYDROXIDE-SIMETH 200-200-20 MG/5ML PO SUSP: 30.0000 mL | ORAL | Status: DC | PRN

## 2022-07-29 MED ORDER — ACETAMINOPHEN 325 MG PO TABS: 325.0000 mg | ORAL_TABLET | Freq: Four times a day (QID) | ORAL | Status: DC | PRN

## 2022-07-29 MED ORDER — TRAMADOL HCL 50 MG PO TABS
50.0000 mg | ORAL_TABLET | Freq: Four times a day (QID) | ORAL | Status: DC
  Administered 2022-07-29 – 2022-07-30 (×5): 50 mg via ORAL
  Filled 2022-07-29 (×5): qty 1

## 2022-07-29 MED ORDER — ONDANSETRON HCL 4 MG/2ML IJ SOLN: 4.0000 mg | Freq: Four times a day (QID) | INTRAMUSCULAR | Status: DC | PRN

## 2022-07-29 MED ORDER — DOCUSATE SODIUM 100 MG PO CAPS
100.0000 mg | ORAL_CAPSULE | Freq: Two times a day (BID) | ORAL | Status: DC
  Administered 2022-07-29 – 2022-07-30 (×3): 100 mg via ORAL
  Filled 2022-07-29 (×3): qty 1

## 2022-07-29 MED ORDER — BUPIVACAINE LIPOSOME 1.3 % IJ SUSP
INTRAMUSCULAR | Status: DC | PRN
  Administered 2022-07-29: 20 mL

## 2022-07-29 MED ORDER — CARVEDILOL 6.25 MG PO TABS
6.2500 mg | ORAL_TABLET | Freq: Two times a day (BID) | ORAL | Status: DC
  Administered 2022-07-29 – 2022-07-30 (×2): 6.25 mg via ORAL
  Filled 2022-07-29 (×2): qty 1

## 2022-07-29 MED ORDER — BUPIVACAINE LIPOSOME 1.3 % IJ SUSP: 20.0000 mL | Freq: Once | INTRAMUSCULAR | Status: DC

## 2022-07-29 MED ORDER — FENTANYL CITRATE (PF) 100 MCG/2ML IJ SOLN
INTRAMUSCULAR | Status: DC | PRN
  Administered 2022-07-29: 50 ug via INTRAVENOUS
  Administered 2022-07-29: 25 ug via INTRAVENOUS
  Administered 2022-07-29: 50 ug via INTRAVENOUS
  Administered 2022-07-29: 25 ug via INTRAVENOUS

## 2022-07-29 MED ORDER — ARTIFICIAL TEARS OPHTHALMIC OINT
TOPICAL_OINTMENT | OPHTHALMIC | Status: AC
  Filled 2022-07-29: qty 3.5

## 2022-07-29 MED ORDER — ONDANSETRON HCL 4 MG PO TABS: 4.0000 mg | ORAL_TABLET | Freq: Four times a day (QID) | ORAL | Status: DC | PRN

## 2022-07-29 MED ORDER — PROPOFOL 10 MG/ML IV BOLUS
INTRAVENOUS | Status: DC | PRN
  Administered 2022-07-29: 150 mg via INTRAVENOUS

## 2022-07-29 MED ORDER — DEXAMETHASONE SODIUM PHOSPHATE 10 MG/ML IJ SOLN
10.0000 mg | Freq: Once | INTRAMUSCULAR | Status: AC
  Administered 2022-07-30: 10 mg via INTRAVENOUS
  Filled 2022-07-29: qty 1

## 2022-07-29 MED ORDER — POVIDONE-IODINE 10 % EX SWAB: 2.0000 | Freq: Once | CUTANEOUS | Status: DC

## 2022-07-29 MED ORDER — ONDANSETRON HCL 4 MG/2ML IJ SOLN: 4.0000 mg | Freq: Once | INTRAMUSCULAR | Status: DC | PRN

## 2022-07-29 MED ORDER — ACETAMINOPHEN 500 MG PO TABS
1000.0000 mg | ORAL_TABLET | Freq: Four times a day (QID) | ORAL | Status: AC
  Administered 2022-07-29 – 2022-07-30 (×4): 1000 mg via ORAL
  Filled 2022-07-29 (×4): qty 2

## 2022-07-29 MED ORDER — ALBUTEROL SULFATE (2.5 MG/3ML) 0.083% IN NEBU: 2.5000 mg | INHALATION_SOLUTION | Freq: Four times a day (QID) | RESPIRATORY_TRACT | Status: DC | PRN

## 2022-07-29 MED ORDER — 0.9 % SODIUM CHLORIDE (POUR BTL) OPTIME
TOPICAL | Status: DC | PRN
  Administered 2022-07-29: 1000 mL

## 2022-07-29 MED ORDER — AMISULPRIDE (ANTIEMETIC) 5 MG/2ML IV SOLN: 10.0000 mg | Freq: Once | INTRAVENOUS | Status: DC | PRN

## 2022-07-29 MED ORDER — OXYCODONE HCL 5 MG PO TABS: 10.0000 mg | ORAL_TABLET | ORAL | Status: DC | PRN

## 2022-07-29 MED ORDER — ATORVASTATIN CALCIUM 40 MG PO TABS
40.0000 mg | ORAL_TABLET | Freq: Every day | ORAL | Status: DC
  Administered 2022-07-29 – 2022-07-30 (×2): 40 mg via ORAL
  Filled 2022-07-29 (×2): qty 1

## 2022-07-29 MED ORDER — TRANEXAMIC ACID-NACL 1000-0.7 MG/100ML-% IV SOLN
1000.0000 mg | Freq: Once | INTRAVENOUS | Status: AC
  Administered 2022-07-29: 1000 mg via INTRAVENOUS
  Filled 2022-07-29: qty 100

## 2022-07-29 MED ORDER — MENTHOL 3 MG MT LOZG: 1.0000 | LOZENGE | OROMUCOSAL | Status: DC | PRN

## 2022-07-29 MED ORDER — LIDOCAINE HCL (CARDIAC) PF 100 MG/5ML IV SOSY
PREFILLED_SYRINGE | INTRAVENOUS | Status: DC | PRN
  Administered 2022-07-29: 60 mg via INTRAVENOUS

## 2022-07-29 MED ORDER — METHOCARBAMOL 500 MG PO TABS: 500.0000 mg | ORAL_TABLET | Freq: Four times a day (QID) | ORAL | Status: DC | PRN

## 2022-07-29 MED ORDER — FENTANYL CITRATE PF 50 MCG/ML IJ SOSY: 25.0000 ug | PREFILLED_SYRINGE | INTRAMUSCULAR | Status: DC | PRN

## 2022-07-29 MED ORDER — DEXAMETHASONE SODIUM PHOSPHATE 10 MG/ML IJ SOLN
INTRAMUSCULAR | Status: AC
  Filled 2022-07-29: qty 1

## 2022-07-29 MED ORDER — METOCLOPRAMIDE HCL 5 MG PO TABS: 5.0000 mg | ORAL_TABLET | Freq: Three times a day (TID) | ORAL | Status: DC | PRN

## 2022-07-29 MED ORDER — DEXAMETHASONE SODIUM PHOSPHATE 10 MG/ML IJ SOLN
8.0000 mg | Freq: Once | INTRAMUSCULAR | Status: AC
  Administered 2022-07-29: 10 mg via INTRAVENOUS

## 2022-07-29 MED ORDER — FENTANYL CITRATE (PF) 250 MCG/5ML IJ SOLN
INTRAMUSCULAR | Status: AC
  Filled 2022-07-29: qty 5

## 2022-07-29 MED ORDER — CHLORHEXIDINE GLUCONATE 0.12 % MT SOLN
15.0000 mL | Freq: Once | OROMUCOSAL | Status: AC
  Administered 2022-07-29: 15 mL via OROMUCOSAL

## 2022-07-29 MED ORDER — FENTANYL CITRATE PF 50 MCG/ML IJ SOSY
50.0000 ug | PREFILLED_SYRINGE | INTRAMUSCULAR | Status: DC
  Filled 2022-07-29: qty 2

## 2022-07-29 SURGICAL SUPPLY — 55 items
BAG COUNTER SPONGE SURGICOUNT (BAG) IMPLANT
BAG SPNG CNTER NS LX DISP (BAG)
BLADE HEX COATED 2.75 (ELECTRODE) ×1 IMPLANT
BLADE SAG 18X100X1.27 (BLADE) ×1 IMPLANT
BLADE SAGITTAL 25.0X1.37X90 (BLADE) ×1 IMPLANT
BLADE SURG 15 STRL LF DISP TIS (BLADE) ×1 IMPLANT
BLADE SURG 15 STRL SS (BLADE) ×1
BLADE SURG SZ10 CARB STEEL (BLADE) IMPLANT
BNDG CMPR MED 10X6 ELC LF (GAUZE/BANDAGES/DRESSINGS) ×1
BNDG ELASTIC 6X10 VLCR STRL LF (GAUZE/BANDAGES/DRESSINGS) ×1 IMPLANT
BOWL SMART MIX CTS (DISPOSABLE) IMPLANT
BSPLAT TIB 4 KN TRITANIUM (Knees) ×1 IMPLANT
CLSR STERI-STRIP ANTIMIC 1/2X4 (GAUZE/BANDAGES/DRESSINGS) ×1 IMPLANT
COMPONENT TRI CR RETAIN KNEE (Orthopedic Implant) IMPLANT
COVER SURGICAL LIGHT HANDLE (MISCELLANEOUS) ×1 IMPLANT
CUFF TOURN SGL QUICK 34 (TOURNIQUET CUFF) ×1
CUFF TRNQT CYL 34X4.125X (TOURNIQUET CUFF) ×1 IMPLANT
DRAPE U-SHAPE 47X51 STRL (DRAPES) ×1 IMPLANT
DRSG MEPILEX POST OP 4X12 (GAUZE/BANDAGES/DRESSINGS) ×1 IMPLANT
DURAPREP 26ML APPLICATOR (WOUND CARE) ×2 IMPLANT
GLOVE BIO SURGEON STRL SZ7.5 (GLOVE) ×2 IMPLANT
GLOVE BIOGEL PI IND STRL 7.5 (GLOVE) ×1 IMPLANT
GLOVE BIOGEL PI IND STRL 8 (GLOVE) ×1 IMPLANT
GLOVE SURG SYN 7.5  E (GLOVE) ×1
GLOVE SURG SYN 7.5 E (GLOVE) ×1 IMPLANT
GLOVE SURG SYN 7.5 PF PI (GLOVE) ×1 IMPLANT
GOWN STRL REUS W/ TWL LRG LVL3 (GOWN DISPOSABLE) ×1 IMPLANT
GOWN STRL REUS W/ TWL XL LVL3 (GOWN DISPOSABLE) ×1 IMPLANT
GOWN STRL REUS W/TWL LRG LVL3 (GOWN DISPOSABLE) ×1
GOWN STRL REUS W/TWL XL LVL3 (GOWN DISPOSABLE) ×1
HANDPIECE INTERPULSE COAX TIP (DISPOSABLE) ×1
HOLDER FOLEY CATH W/STRAP (MISCELLANEOUS) IMPLANT
IMMOBILIZER KNEE 20 (SOFTGOODS) ×1
IMMOBILIZER KNEE 20 THIGH 36 (SOFTGOODS) IMPLANT
IMMOBILIZER KNEE 22 UNIV (SOFTGOODS) IMPLANT
INSERT TIBIA BEAR SZ4 12 KNEE (Knees) IMPLANT
KNEE PATELLA ASYMMETRIC 9X29 (Knees) IMPLANT
KNEE TIBIAL COMP TRI SZ4 (Knees) IMPLANT
MANIFOLD NEPTUNE II (INSTRUMENTS) ×1 IMPLANT
NS IRRIG 1000ML POUR BTL (IV SOLUTION) ×1 IMPLANT
PACK TOTAL KNEE CUSTOM (KITS) ×1 IMPLANT
PIN FLUTED HEDLESS FIX 3.5X1/8 (PIN) IMPLANT
PROTECTOR NERVE ULNAR (MISCELLANEOUS) ×1 IMPLANT
SET HNDPC FAN SPRY TIP SCT (DISPOSABLE) ×1 IMPLANT
SPIKE FLUID TRANSFER (MISCELLANEOUS) ×1 IMPLANT
SUT MNCRL AB 3-0 PS2 18 (SUTURE) ×1 IMPLANT
SUT VIC AB 0 CT1 36 (SUTURE) ×1 IMPLANT
SUT VIC AB 1 CT1 36 (SUTURE) ×2 IMPLANT
SUT VIC AB 2-0 CT1 27 (SUTURE) ×1
SUT VIC AB 2-0 CT1 TAPERPNT 27 (SUTURE) ×1 IMPLANT
TRAY FOLEY MTR SLVR 14FR STAT (SET/KITS/TRAYS/PACK) IMPLANT
TRAY FOLEY MTR SLVR 16FR STAT (SET/KITS/TRAYS/PACK) IMPLANT
TRIA CRUCIATE RETAIN KNEE (Orthopedic Implant) ×1 IMPLANT
TUBE SUCTION HIGH CAP CLEAR NV (SUCTIONS) ×1 IMPLANT
WRAP KNEE MAXI GEL POST OP (GAUZE/BANDAGES/DRESSINGS) ×1 IMPLANT

## 2022-07-29 NOTE — Anesthesia Procedure Notes (Signed)
Procedure Name: LMA Insertion Date/Time: 07/29/2022 9:58 AM  Performed by: Randye Lobo, CRNAPre-anesthesia Checklist: Patient identified, Emergency Drugs available, Suction available and Patient being monitored Patient Re-evaluated:Patient Re-evaluated prior to induction Oxygen Delivery Method: Circle System Utilized Preoxygenation: Pre-oxygenation with 100% oxygen Induction Type: IV induction Ventilation: Mask ventilation without difficulty LMA: LMA inserted LMA Size: 4.0 Number of attempts: 1 Placement Confirmation: positive ETCO2 Tube secured with: Tape Dental Injury: Teeth and Oropharynx as per pre-operative assessment

## 2022-07-29 NOTE — Transfer of Care (Signed)
Immediate Anesthesia Transfer of Care Note  Patient: [name]  Procedure(s) Performed: TOTAL KNEE ARTHROPLASTY (Right: Knee)  Patient Location: PACU  Anesthesia Type:General  Level of Consciousness: awake, alert , oriented, patient cooperative, and responds to stimulation  Airway & Oxygen Therapy: Patient Spontanous Breathing and Patient connected to face mask oxygen  Post-op Assessment: Report given to RN, Post -op Vital signs reviewed and stable, and Patient moving all extremities  Post vital signs: Reviewed and stable  Last Vitals:  Vitals Value Taken Time  BP 129/75 07/29/22 1200  Temp 36.3 C 07/29/22 1157  Pulse 92 07/29/22 1202  Resp 30 07/29/22 1202  SpO2 92 % 07/29/22 1202  Vitals shown include unvalidated device data.  Last Pain:  Vitals:   07/29/22 0931  TempSrc:   PainSc: 0-No pain         Complications: No notable events documented.

## 2022-07-29 NOTE — Discharge Instructions (Signed)

## 2022-07-29 NOTE — Progress Notes (Signed)
Orthopedic Tech Progress Note Patient Details:  [name] 02-04-1946 [id number]  Patient ID: [name], male   DOB: 1946/01/08, 77 y.o.   MRN: [id number]  Kennis Carina 07/29/2022, 12:33 PM Cpm applied in pacu. Bone foam placed on bed

## 2022-07-29 NOTE — Anesthesia Procedure Notes (Signed)
Procedure Name: LMA Insertion Date/Time: 07/29/2022 7:58 PM  Performed by: Randye Lobo, CRNAPre-anesthesia Checklist: Patient identified, Emergency Drugs available, Suction available and Patient being monitored Patient Re-evaluated:Patient Re-evaluated prior to induction Oxygen Delivery Method: Circle System Utilized Preoxygenation: Pre-oxygenation with 100% oxygen Induction Type: IV induction Ventilation: Mask ventilation without difficulty LMA: LMA inserted LMA Size: 4.0 Number of attempts: 1 Airway Equipment and Method: Bite block Placement Confirmation: positive ETCO2 Tube secured with: Tape Dental Injury: Teeth and Oropharynx as per pre-operative assessment

## 2022-07-29 NOTE — Anesthesia Procedure Notes (Signed)
Anesthesia Regional Block: Adductor canal block   Pre-Anesthetic Checklist: , timeout performed,  Correct Patient, Correct Site, Correct Laterality,  Correct Procedure,, site marked,  Risks and benefits discussed,  Surgical consent,  Pre-op evaluation,  At surgeon's request and post-op pain management  Laterality: Right  Prep: chloraprep       Needles:  Injection technique: Single-shot  Needle Type: Echogenic Stimulator Needle     Needle Length: 9cm  Needle Gauge: 21     Additional Needles:   Procedures:,,,, ultrasound used (permanent image in chart),,    Narrative:  Start time: 07/29/2022 9:20 AM End time: 07/29/2022 9:30 AM Injection made incrementally with aspirations every 5 mL.  Performed by: Personally  Anesthesiologist: Murvin Natal, MD  Additional Notes: Functioning IV was confirmed and monitors were applied. A time-out was performed. Hand hygiene and sterile gloves were used. The thigh was placed in a frog-leg position and prepped in a sterile fashion. A 29m 21ga Arrow echogenic stimulator needle was placed using ultrasound guidance.  Negative aspiration and negative test dose prior to incremental administration of local anesthetic. The patient tolerated the procedure well.

## 2022-07-29 NOTE — Progress Notes (Signed)
ANTICOAGULATION CONSULT NOTE - Initial Consult  Pharmacy Consult for Warfarin Indication: atrial fibrillation  No Known Allergies  Patient Measurements: Height: '5\' 6"'$  (167.6 cm) Weight: 65.8 kg (145 lb) IBW/kg (Calculated) : 63.8  Vital Signs: Temp: 97.4 F (36.3 C) (01/30 1245) Temp Source: Oral (01/30 0826) BP: 154/88 (01/30 1300) Pulse Rate: 76 (01/30 1300)  Labs: Recent Labs    07/29/22 0830  APTT 32  LABPROT 15.8*  INR 1.3*    Estimated Creatinine Clearance: 57.3 mL/min (by C-G formula based on SCr of 1.02 mg/dL).   Medical History: Past Medical History:  Diagnosis Date   Allergic rhinitis 08/08/2013   takes Loratadine daily    Aortic stenosis 09/08/2011   With mild aortic regurgitation, mean gradient 13 mmHg    Cataract    right but immature   CHF (congestive heart failure) ([place]) 5784   Complication of anesthesia    stopped breathing - during shoulder surgery 2009-2010   Coronary artery disease 10/02/2009   Cardiac cath (October 2007): 20% left main, 60% mid LAD, 60-70% distal LAD, 30-40% first diagonal, 30% circumflex, 40% OM, 30-40% RCA    Degenerative joint disease of shoulder 08/03/2013   Bilateral, s/p right total shoulder arthroplasty 05/29/2011 and left shoulder arthroplasty 69/62/9528   Diastolic dysfunction 41/32/4401   Echo (04/04/2016): Grade I   Diverticulosis 10/07/2013   Seen on colonoscopy in 2007    Erectile dysfunction 05/07/2009   Essential hypertension 08/03/2013   had been on Lisinopril but stopped per MD   First degree AV block 03/14/2016   Gastric ulcer 10/07/2013   Seen on EGD 04/10/2006    Hemorrhoids 10/07/2013   Hyperlipidemia 10/02/2009   Paroxysmal atrial fibrillation ([place]) 07/14/2006   One episode, provoked by alcohol use disorder, briefly anticoagulated with warfarin, then switched to aspirin alone   Pre-diabetes    Sciatica associated with disorder of lumbar spine 08/03/2013   Anterolisthesis with right L 4-5 nerve  root compression.  Treated with epidural injections and Physical Therapy.    Seborrheic keratosis 10/07/2013   Tubular adenoma of colon     Medications:  Medications Prior to Admission  Medication Sig Dispense Refill Last Dose   acetaminophen (TYLENOL) 500 MG tablet Take 2 tablets (1,000 mg total) by mouth 4 (four) times daily. (Patient taking differently: Take 1,000 mg by mouth every 6 (six) hours as needed for moderate pain.) 120 tablet 3 07/28/2022   atorvastatin (LIPITOR) 40 MG tablet Take 1 tablet (40 mg total) by mouth daily. 30 tablet 5 07/28/2022   carvedilol (COREG) 6.25 MG tablet Take 1 tablet (6.25 mg total) by mouth 2 (two) times daily with a meal. 30 tablet 5 07/29/2022 at 0300   docusate sodium (COLACE) 100 MG capsule Take 1 capsule (100 mg total) by mouth 2 (two) times daily. 60 capsule 1 07/29/2022 at 0300   ePHEDrine HCl (PRIMATENE) 12.5 MG TABS Take 12.5 mg by mouth daily as needed (wheezing).   Past Month   finasteride (PROSCAR) 5 MG tablet Take 1 tablet (5 mg total) by mouth daily. 30 tablet 5 07/29/2022 at 0300   losartan (COZAAR) 50 MG tablet TAKE 1 TABLET EVERY DAY 90 tablet 3 07/28/2022   silodosin (RAPAFLO) 8 MG CAPS capsule Take 1 capsule (8 mg total) by mouth daily with breakfast. 30 capsule 3 07/28/2022   warfarin (COUMADIN) 5 MG tablet Take 0.5 tablets (2.5 mg total) by mouth daily.   07/24/2022   albuterol (VENTOLIN HFA) 108 (90 Base) MCG/ACT inhaler Inhale 2  puffs into the lungs every 6 (six) hours as needed for wheezing or shortness of breath. 8 g 2 More than a month   loratadine (CLARITIN) 10 MG tablet Take 10 mg by mouth daily as needed (wheezing).   More than a month   Scheduled:   bupivacaine liposome  20 mL Other Once   fentaNYL (SUBLIMAZE) injection  50-100 mcg Intravenous UD   midazolam  1-2 mg Intravenous UD   povidone-iodine  2 Application Topical Once    Assessment: 36 yoM admitted on 1/30 s/p right total knee arthroplasty.  He has a history of Afib on  warfarin which was held for surgery.  Ortho planning to resume warfarin on POD1.   INR: 1.3 Baseline CBC: Hgb and Plt WNL (1/22)  Most recent anticoag visit on 1/17:  warfarin 1/2 tablet (2.'5mg'$ ) daily.  Hold Warfarin 5 day prior to procedure, restart postop, recheck INR in 1 week.   Goal of Therapy:  INR 2-3 Monitor platelets by anticoagulation protocol: Yes   Plan:  Warfarin to resume on POD1, 1/31. Daily PT/INR. Monitor for signs and symptoms of bleeding.   Gretta Arab PharmD, BCPS [facility] pharmacy [phone] 07/29/2022 1:13 PM

## 2022-07-29 NOTE — Evaluation (Signed)
Physical Therapy Evaluation Patient Details Name: [name] MRN: [id number] DOB: 1946/01/03 Today's Date: 07/29/2022  History of Present Illness  77 y.o. male admitted 07/29/22 for R TKA. PMH: CHF, R TSA 2012, L TSA 2015.  Clinical Impression  Pt is s/p TKA resulting in the deficits listed below (see PT Problem List). Pt ambulated 68' with RW, no loss of balance. Initiated TKA HEP. Good progress expected.  Pt will benefit from skilled PT to increase their independence and safety with mobility to allow discharge to the venue listed below.         Recommendations for follow up therapy are one component of a multi-disciplinary discharge planning process, led by the attending physician.  Recommendations may be updated based on patient status, additional functional criteria and insurance authorization.  Follow Up Recommendations Follow physician's recommendations for discharge plan and follow up therapies      Assistance Recommended at Discharge Intermittent Supervision/Assistance  Patient can return home with the following  A little help with bathing/dressing/bathroom;Assistance with cooking/housework;Assist for transportation;Help with stairs or ramp for entrance    Equipment Recommendations None recommended by PT  Recommendations for Other Services       Functional Status Assessment Patient has had a recent decline in their functional status and demonstrates the ability to make significant improvements in function in a reasonable and predictable amount of time.     Precautions / Restrictions Precautions Precautions: Knee Precaution Comments: reviewed no pillow under knee Restrictions Weight Bearing Restrictions: No Other Position/Activity Restrictions: WBAT      Mobility  Bed Mobility Overal bed mobility: Modified Independent             General bed mobility comments: used rail, HOB up    Transfers Overall transfer level: Needs assistance Equipment used:  Rolling walker (2 wheels) Transfers: Sit to/from Stand Sit to Stand: Min guard           General transfer comment: VCs hand placement    Ambulation/Gait Ambulation/Gait assistance: Supervision Gait Distance (Feet): 60 Feet Assistive device: Rolling walker (2 wheels) Gait Pattern/deviations: Step-to pattern, Decreased step length - right, Decreased step length - left       General Gait Details: VCs sequencing, no loss of balance  Stairs            Wheelchair Mobility    Modified Rankin (Stroke Patients Only)       Balance Overall balance assessment: Modified Independent                                           Pertinent Vitals/Pain Pain Assessment Pain Assessment: 0-10 Pain Score: 3  Pain Location: R knee Pain Descriptors / Indicators: Sore Pain Intervention(s): Limited activity within patient's tolerance, Monitored during session, Premedicated before session, Ice applied    Home Living Family/patient expects to be discharged to:: Private residence Living Arrangements: Spouse/significant other Available Help at Discharge: Family;Available 24 hours/day Type of Home: House Home Access: Stairs to enter Entrance Stairs-Rails: Left Entrance Stairs-Number of Steps: 1   Home Layout: One level Home Equipment: Cane - single Barista (2 wheels)      Prior Function Prior Level of Function : Independent/Modified Independent             Mobility Comments: no falls in past 6 months, walks without AD in home, sometimes cane when going out  Hand Dominance   Dominant Hand: Right    Extremity/Trunk Assessment   Upper Extremity Assessment Upper Extremity Assessment: Overall WFL for tasks assessed    Lower Extremity Assessment Lower Extremity Assessment: RLE deficits/detail RLE Deficits / Details: 3/5 SLR, AAROM 5-55* knee RLE Sensation: WNL RLE Coordination: WNL    Cervical / Trunk Assessment Cervical / Trunk  Assessment: Normal  Communication   Communication: HOH  Cognition Arousal/Alertness: Awake/alert Behavior During Therapy: WFL for tasks assessed/performed Overall Cognitive Status: Within Functional Limits for tasks assessed                                          General Comments      Exercises Total Joint Exercises Ankle Circles/Pumps: AROM, Both, 10 reps, Supine Heel Slides: AAROM, Right, 15 reps, Supine   Assessment/Plan    PT Assessment Patient needs continued PT services  PT Problem List Decreased range of motion;Decreased strength;Decreased mobility;Decreased activity tolerance;Pain       PT Treatment Interventions Gait training;Therapeutic activities;Functional mobility training;Stair training;Therapeutic exercise    PT Goals (Current goals can be found in the Care Plan section)  Acute Rehab PT Goals Patient Stated Goal: return to independence PT Goal Formulation: With patient Time For Goal Achievement: 08/04/22 Potential to Achieve Goals: Good    Frequency 7X/week     Co-evaluation               AM-PAC PT "6 Clicks" Mobility  Outcome Measure Help needed turning from your back to your side while in a flat bed without using bedrails?: None Help needed moving from lying on your back to sitting on the side of a flat bed without using bedrails?: A Little Help needed moving to and from a bed to a chair (including a wheelchair)?: A Little Help needed standing up from a chair using your arms (e.g., wheelchair or bedside chair)?: A Little Help needed to walk in hospital room?: A Little Help needed climbing 3-5 steps with a railing? : A Little 6 Click Score: 19    End of Session Equipment Utilized During Treatment: Gait belt Activity Tolerance: Patient tolerated treatment well Patient left: in chair;with call bell/phone within reach;with family/visitor present Nurse Communication: Mobility status PT Visit Diagnosis: Pain;Difficulty in  walking, not elsewhere classified (R26.2) Pain - Right/Left: Right Pain - part of body: Knee    Time: 1520-1541 PT Time Calculation (min) (ACUTE ONLY): 21 min   Charges:   PT Evaluation $PT Eval Moderate Complexity: 1 Mod          Philomena Doheny PT 07/29/2022  Acute Rehabilitation Services  Office [phone]

## 2022-07-29 NOTE — Interval H&P Note (Signed)
History and Physical Interval Note:  07/29/2022 8:45 AM  [name]  has presented today for surgery, with the diagnosis of OA RIGHT KNEE.  The various methods of treatment have been discussed with the patient and family. After consideration of risks, benefits and other options for treatment, the patient has consented to  Procedure(s): TOTAL KNEE ARTHROPLASTY (Right) as a surgical intervention.  The patient's history has been reviewed, patient examined, no change in status, stable for surgery.  I have reviewed the patient's chart and labs.  Questions were answered to the patient's satisfaction.     Renette Butters

## 2022-07-30 ENCOUNTER — Encounter (HOSPITAL_COMMUNITY): Payer: Self-pay | Admitting: Orthopedic Surgery

## 2022-07-30 DIAGNOSIS — Z955 Presence of coronary angioplasty implant and graft: Secondary | ICD-10-CM | POA: Diagnosis not present

## 2022-07-30 DIAGNOSIS — I2510 Atherosclerotic heart disease of native coronary artery without angina pectoris: Secondary | ICD-10-CM | POA: Diagnosis not present

## 2022-07-30 DIAGNOSIS — I509 Heart failure, unspecified: Secondary | ICD-10-CM | POA: Diagnosis not present

## 2022-07-30 DIAGNOSIS — Z96612 Presence of left artificial shoulder joint: Secondary | ICD-10-CM | POA: Diagnosis not present

## 2022-07-30 DIAGNOSIS — Z96652 Presence of left artificial knee joint: Secondary | ICD-10-CM | POA: Diagnosis not present

## 2022-07-30 DIAGNOSIS — Z79899 Other long term (current) drug therapy: Secondary | ICD-10-CM | POA: Diagnosis not present

## 2022-07-30 DIAGNOSIS — I110 Hypertensive heart disease with heart failure: Secondary | ICD-10-CM | POA: Diagnosis not present

## 2022-07-30 DIAGNOSIS — M1711 Unilateral primary osteoarthritis, right knee: Secondary | ICD-10-CM | POA: Diagnosis not present

## 2022-07-30 DIAGNOSIS — I480 Paroxysmal atrial fibrillation: Secondary | ICD-10-CM | POA: Diagnosis not present

## 2022-07-30 LAB — PROTIME-INR
INR: 1.3 — ABNORMAL HIGH (ref 0.8–1.2)
Prothrombin Time: 15.6 seconds — ABNORMAL HIGH (ref 11.4–15.2)

## 2022-07-30 MED ORDER — METHOCARBAMOL 750 MG PO TABS: 750.0000 mg | ORAL_TABLET | Freq: Three times a day (TID) | ORAL | 0 refills | Status: DC | PRN

## 2022-07-30 MED ORDER — ONDANSETRON 4 MG PO TBDP: 4.0000 mg | ORAL_TABLET | Freq: Three times a day (TID) | ORAL | 0 refills | Status: DC | PRN

## 2022-07-30 MED ORDER — WARFARIN SODIUM 4 MG PO TABS
4.0000 mg | ORAL_TABLET | Freq: Once | ORAL | Status: DC
  Filled 2022-07-30: qty 1

## 2022-07-30 MED ORDER — WARFARIN - PHARMACIST DOSING INPATIENT: Freq: Every day | Status: DC

## 2022-07-30 MED ORDER — OXYCODONE HCL 5 MG PO TABS: 5.0000 mg | ORAL_TABLET | ORAL | 0 refills | Status: DC | PRN

## 2022-07-30 NOTE — Progress Notes (Signed)
    Subjective: Patient reports pain as mild.  Tolerating diet.  Urinating.   No CP, SOB.  Has mobilized some OOB with PT.  Objective:   VITALS:   Vitals:   07/29/22 1811 07/29/22 2123 07/30/22 0144 07/30/22 0530  BP: (!) 155/80 (!) 150/96 (!) 147/86 (!) 146/94  Pulse: 91 78 (!) 101 89  Resp:  '18 17 17  '$ Temp:  (!) 97.5 F (36.4 C) (!) 97.5 F (36.4 C) (!) 97.4 F (36.3 C)  TempSrc:  Oral Oral Oral  SpO2: 95% 95% 91% 97%  Weight:      Height:          Latest Ref Rng & Units 07/22/2022   10:23 AM 07/21/2022   11:23 AM 06/03/2022    3:28 AM  CBC  WBC 4.0 - 10.5 K/uL  9.9  4.9   Hemoglobin 13.0 - 17.0 g/dL 13.9  13.5  11.7   Hematocrit 39.0 - 52.0 % 41.0  43.0  38.3   Platelets 150 - 400 K/uL  183  145       Latest Ref Rng & Units 07/24/2022    8:26 AM 07/22/2022   10:23 AM 07/21/2022   11:23 AM  BMP  Glucose 70 - 99 mg/dL 182  91  71   BUN 8 - 23 mg/dL '9  12  16   '$ Creatinine 0.61 - 1.24 mg/dL 1.02  0.80  0.90   Sodium 135 - 145 mmol/L 142  144  141   Potassium 3.5 - 5.1 mmol/L 4.2  3.2  2.4   Chloride 98 - 111 mmol/L 106  100  98   CO2 22 - 32 mmol/L 26   31   Calcium 8.9 - 10.3 mg/dL 8.9   8.4    Intake/Output      01/30 0701 01/31 0700 01/31 0701 02/01 0700   P.O. 1020    I.V. (mL/kg) 1200 (18.2)    IV Piggyback 381.5    Total Intake(mL/kg) 2601.5 (39.5)    Urine (mL/kg/hr) 870    Stool 0    Blood 20    Total Output 890    Net +1711.5            Physical Exam: General: NAD.  Sitting up in bedside chair, calm, comfortable Resp: No increased wob Cardio: regular rate and rhythm ABD soft Neurologically intact MSK Neurovascularly intact Sensation intact distally Intact pulses distally Dorsiflexion/Plantar flexion intact Incision: dressing C/D/I   Assessment: 1 Day Post-Op  S/P Procedure(s) (LRB): TOTAL KNEE ARTHROPLASTY (Right) by Dr. Ernesta Amble. Murphy on 07/29/22  Principal Problem:   S/P total knee arthroplasty, right   Plan:  Advance  diet Up with therapy Incentive Spirometry Elevate and Apply ice  Weightbearing: WBAT RLE Insicional and dressing care: Dressings left intact until follow-up and Reinforce dressings as needed Orthopedic device(s):  CPM and bone foam Showering: Keep dressing dry VTE prophylaxis:  Coumadin to restart today  , SCDs, ambulation Pain control: Tylenol, Oxy, Dilaudid PRN Follow - up plan: 2 weeks Contact information:  Edmonia Lynch MD, Aggie Moats PA-C  Dispo: Home hopefully today if passes PT evaluation. Has HHPT set up already.      Britt Bottom, PA-C Office [phone] 07/30/2022, 11:53 AM

## 2022-07-30 NOTE — Progress Notes (Signed)
Physical Therapy Treatment Patient Details Name: [name] MRN: [id number] DOB: [birth year] Today's Date: 07/30/2022   History of Present Illness 77 y.o. male admitted 07/29/22 for R TKA. PMH: CHF, R TSA 2012, L TSA 2015.    PT Comments    POD # 1 am session Pt AxO x 3 very pleasant and eager to go home.  Assisted OOB to amb in hallway went well.  General transfer comment: VCs hand placement and safety with turns.  Pt a bit impulsive. Practiced ONE step.  General stair comments: 25% VC's on proper walker placement as well as sequencing.  Then returned to room to perform some TE's following HEP handout.  Instructed on proper tech, freq as well as use of ICE.   Addressed all mobility questions, discussed appropriate activity, educated on use of ICE.  Pt ready for D/C to home.   Recommendations for follow up therapy are one component of a multi-disciplinary discharge planning process, led by the attending physician.  Recommendations may be updated based on patient status, additional functional criteria and insurance authorization.  Follow Up Recommendations  Follow physician's recommendations for discharge plan and follow up therapies     Assistance Recommended at Discharge Intermittent Supervision/Assistance  Patient can return home with the following A little help with bathing/dressing/bathroom;Assistance with cooking/housework;Assist for transportation;Help with stairs or ramp for entrance   Equipment Recommendations  None recommended by PT    Recommendations for Other Services       Precautions / Restrictions Precautions Precautions: Knee Precaution Comments: reviewed no pillow under knee Restrictions Weight Bearing Restrictions: No RLE Weight Bearing: Weight bearing as tolerated     Mobility  Bed Mobility Overal bed mobility: Needs Assistance Bed Mobility: Supine to Sit     Supine to sit: Supervision     General bed mobility comments: pt self able to perform  SLR    Transfers Overall transfer level: Needs assistance Equipment used: Rolling walker (2 wheels) Transfers: Sit to/from Stand Sit to Stand: Supervision, Min guard           General transfer comment: VCs hand placement and safety with turns.  Pt a bit impulsive.    Ambulation/Gait Ambulation/Gait assistance: Supervision Gait Distance (Feet): 75 Feet Assistive device: Rolling walker (2 wheels) Gait Pattern/deviations: Step-to pattern, Decreased step length - right, Decreased step length - left Gait velocity: decreased     General Gait Details: tolerated a functional distance with < 25% VC's on safety with turns (slightly impulsive)   Stairs Stairs: Yes Stairs assistance: Supervision, Min guard Stair Management: No rails, Step to pattern, With walker Number of Stairs: 1 General stair comments: 25% VC's on proper walker placement as well as sequencing   Wheelchair Mobility    Modified Rankin (Stroke Patients Only)       Balance                                            Cognition Arousal/Alertness: Awake/alert Behavior During Therapy: WFL for tasks assessed/performed Overall Cognitive Status: Within Functional Limits for tasks assessed                                 General Comments: AxO x 3 very motivated and eager to D/C to home        Exercises   Total  Knee Replacement TE's following HEP handout 10 reps B LE ankle pumps 05 reps towel squeezes 05 reps knee presses 05 reps heel slides  05 reps SAQ's 05 reps SLR's 05 reps ABD Educated on use of gait belt to assist with TE's Followed by ICE    General Comments        Pertinent Vitals/Pain Pain Assessment Pain Assessment: 0-10 Pain Score: 5  Pain Location: R knee Pain Descriptors / Indicators: Aching, Discomfort, Operative site guarding Pain Intervention(s): Monitored during session, Premedicated before session, Repositioned, Ice applied    Home Living                           Prior Function            PT Goals (current goals can now be found in the care plan section) Progress towards PT goals: Progressing toward goals    Frequency    7X/week      PT Plan Current plan remains appropriate    Co-evaluation              AM-PAC PT "6 Clicks" Mobility   Outcome Measure  Help needed turning from your back to your side while in a flat bed without using bedrails?: None Help needed moving from lying on your back to sitting on the side of a flat bed without using bedrails?: A Little Help needed moving to and from a bed to a chair (including a wheelchair)?: A Little Help needed standing up from a chair using your arms (e.g., wheelchair or bedside chair)?: A Little Help needed to walk in hospital room?: A Little Help needed climbing 3-5 steps with a railing? : A Little 6 Click Score: 19    End of Session Equipment Utilized During Treatment: Gait belt Activity Tolerance: Patient tolerated treatment well Patient left: in chair;with call bell/phone within reach;with family/visitor present Nurse Communication: Mobility status PT Visit Diagnosis: Pain;Difficulty in walking, not elsewhere classified (R26.2) Pain - Right/Left: Right Pain - part of body: Knee     Time: 4193-7902 PT Time Calculation (min) (ACUTE ONLY): 28 min  Charges:  $Gait Training: 8-22 mins $Therapeutic Exercise: 8-22 mins                     Rica Koyanagi  PTA [place] Office M-F          [phone] Weekend pager [phone]

## 2022-07-30 NOTE — Op Note (Signed)
DATE OF SURGERY:  07/29/2022 TIME: 8:27 AM  PATIENT NAME:  [name]   AGE: 77 y.o.    PRE-OPERATIVE DIAGNOSIS:  OA RIGHT KNEE  POST-OPERATIVE DIAGNOSIS:  Same  PROCEDURE:  Procedure(s): TOTAL KNEE ARTHROPLASTY   SURGEON:  Renette Butters, MD   ASSISTANT:  Aggie Moats, PA-C, she was present and scrubbed throughout the case, critical for completion in a timely fashion, and for retraction, instrumentation, and closure.    OPERATIVE IMPLANTS: [employer] Triathlon CR. Press fit knee  Femur size 3, Tibia size 4, Patella size 29 3-peg oval button, with a 12 mm polyethylene insert.   PREOPERATIVE INDICATIONS:  [name] is a 77 y.o. year old male with end stage bone on bone degenerative arthritis of the knee who failed conservative treatment, including injections, antiinflammatories, activity modification, and assistive devices, and had significant impairment of their activities of daily living, and elected for Total Knee Arthroplasty.   The risks, benefits, and alternatives were discussed at length including but not limited to the risks of infection, bleeding, nerve injury, stiffness, blood clots, the need for revision surgery, cardiopulmonary complications, among others, and they were willing to proceed.   OPERATIVE DESCRIPTION:  The patient was brought to the operative room and placed in a supine position.  General anesthesia was administered.  IV antibiotics were given.  The lower extremity was prepped and draped in the usual sterile fashion.  Time out was performed.  The leg was elevated and exsanguinated and the tourniquet was inflated.  Anterior approach was performed.  The patella was everted and osteophytes were removed.  The anterior horn of the medial and lateral meniscus was removed.   The distal femur was opened with the drill and the intramedullary distal femoral cutting jig was utilized, set at 5 degrees resecting 10 mm off the distal femur.  Care was taken  to protect the collateral ligaments.  The distal femoral sizing jig was applied, taking care to avoid notching.  Then the 4-in-1 cutting jig was applied and the anterior and posterior femur was cut, along with the chamfer cuts.  All posterior osteophytes were removed.  The flexion gap was then measured and was symmetric with the extension gap.  Then the extramedullary tibial cutting jig was utilized making the appropriate cut using the anterior tibial crest as a reference building in appropriate posterior slope.  Care was taken during the cut to protect the medial and collateral ligaments.  The proximal tibia was removed along with the posterior horns of the menisci.    I completed the distal femoral preparation using the appropriate jig to prepare the box.  The patella was then measured, and cut with the saw.    The proximal tibia sized and prepared accordingly with the reamer and the punch, and then all components were trialed with the above sized poly insert.  The knee was found to have excellent balance and full motion.    The above named components were then impacted into place and Poly tibial piece and patella were inserted.  I was very happy with his stability and ROM  I performed a periarticular injection with Exparel  The knee was easily taken through a range of motion and the patella tracked well and the knee irrigated copiously and the parapatellar and subcutaneous tissue closed with vicryl, and monocryl with steri strips for the skin.  The incision was dressed with sterile gauze and the tourniquet released and the patient was awakened and returned to the  PACU in stable and satisfactory condition.  There were no complications.  Total tourniquet time was roughly 75 minutes.   POSTOPERATIVE PLAN: post op Abx, DVT px: SCD's, TED's, Early ambulation and chemical px

## 2022-07-30 NOTE — TOC Transition Note (Signed)
Transition of Care [facility]) - CM/SW Discharge Note   Patient Details  Name: [name] MRN: [id number] Date of Birth: [birth year]  [facility] CM/SW Contact:  Lennart Pall, LCSW Phone Number: 07/30/2022, 9:46 AM   Clinical Narrative:     Met briefly with pt and confirming he did receive DME to home PTA.  HHPT prearranged with [facility] via MD office.  No TOC needs.  Final next level of care: [place] Barriers to Discharge: No Barriers Identified   Patient Goals and CMS Choice      Discharge Placement                         Discharge Plan and Services Additional resources added to the After Visit Summary for                  DME Arranged: Walker rolling, CPM DME Agency: [employer]       HH Arranged: PT [place] Agency: [facility]        Social Determinants of Health (SDOH) Interventions SDOH Screenings   Food Insecurity: No Food Insecurity (07/29/2022)  Housing: Low Risk  (07/29/2022)  Transportation Needs: No Transportation Needs (07/29/2022)  Utilities: Not At Risk (07/29/2022)  Recent Concern: Utilities - At Risk (06/03/2022)  Depression (PHQ2-9): Low Risk  (05/05/2022)  Financial Resource Strain: Low Risk  (06/04/2022)  Tobacco Use: Medium Risk (07/29/2022)     Readmission Risk Interventions     No data to display

## 2022-07-30 NOTE — Discharge Summary (Signed)
Physician Discharge Summary  Patient ID: [name] MRN: [id number] DOB/AGE: 1946-05-31 77 y.o.  Admit date: 07/29/2022 Discharge date: 07/30/2022  Admission Diagnoses: right knee OA  Discharge Diagnoses:  Principal Problem:   S/P total knee arthroplasty, right   Discharged Condition: fair  Hospital Course: Patient underwent a right TKA with Dr. Percell Miller on 1/59/45 without complications and spent the night in observation for pain control and mobilization. He has passed his PT evaluation and is ready for discharge home with HHPT already set up.  Consults: None  Significant Diagnostic Studies: n/a  Treatments: IV hydration, antibiotics: Ancef, analgesia: acetaminophen, Dilaudid, Oxycodone, and Tramadol, therapies: PT and SW, and surgery: right TKA  Discharge Exam: Blood pressure (!) 146/94, pulse 89, temperature (!) 97.4 F (36.3 C), temperature source Oral, resp. rate 17, height '5\' 6"'$  (1.676 m), weight 65.8 kg, SpO2 97 %. General appearance: alert, cooperative, and no distress Head: Normocephalic, without obvious abnormality, atraumatic Resp: clear to auscultation bilaterally Cardio: no click and no rub Extremities: extremities normal, atraumatic, no cyanosis or edema Pulses:  L brachial 2+ R brachial 2+  L radial 2+ R radial 2+  L inguinal 2+ R inguinal 2+  L popliteal 2+ R popliteal 2+  L posterior tibial 2+ R posterior tibial 2+  L dorsalis pedis 2+ R dorsalis pedis 2+   Neurologic: Grossly normal Incision/Wound: c/d/i  Disposition: Discharge disposition: 01-Home or Self Care       Discharge Instructions     CPM   Complete by: As directed    Continuous passive motion machine (CPM):      Use the CPM from 0 to 90 degrees for 6 hours per day.      You may break it up into 2 or 3 sessions per day.      Use CPM for 3 weeks or until you are told to stop.   Call MD / Call 911   Complete by: As directed    If you experience chest pain or shortness of breath,  CALL 911 and be transported to the hospital emergency room.  If you develope a fever above 101 F, pus (white drainage) or increased drainage or redness at the wound, or calf pain, call your surgeon's office.   Diet - low sodium heart healthy   Complete by: As directed    Discharge instructions   Complete by: As directed    You may bear weight as tolerated. Keep your dressing on and dry until follow up. Take medicine to prevent blood clots as directed. Take pain medicine as needed with the goal of transitioning to over the counter medicines.    INSTRUCTIONS AFTER JOINT REPLACEMENT   Remove items at home which could result in a fall. This includes throw rugs or furniture in walking pathways ICE to the affected joint every three hours while awake for 30 minutes at a time, for at least the first 3-5 days, and then as needed for pain and swelling.  Continue to use ice for pain and swelling. You may notice swelling that will progress down to the foot and ankle.  This is normal after surgery.  Elevate your leg when you are not up walking on it.   Continue to use the breathing machine you got in the hospital (incentive spirometer) which will help keep your temperature down.  It is common for your temperature to cycle up and down following surgery, especially at night when you are not up moving around and exerting yourself.  The breathing machine keeps your lungs expanded and your temperature down.   DIET:  As you were doing prior to hospitalization, we recommend a well-balanced diet.  DRESSING / WOUND CARE / SHOWERING  You may shower 3 days after surgery, but keep the wounds dry during showering.  You may use an occlusive plastic wrap (Press'n Seal for example) with blue painter's tape at edges, NO SOAKING/SUBMERGING IN THE BATHTUB.  If the bandage gets wet, call the office.   ACTIVITY  Increase activity slowly as tolerated, but follow the weight bearing instructions below.   No driving for 6  weeks or until further direction given by your physician.  You cannot drive while taking narcotics.  No lifting or carrying greater than 10 lbs. until further directed by your surgeon. Avoid periods of inactivity such as sitting longer than an hour when not asleep. This helps prevent blood clots.  You may return to work once you are authorized by your doctor.    WEIGHT BEARING   Weight bearing as tolerated with assist device (walker, cane, etc) as directed, use it as long as suggested by your surgeon or therapist, typically at least 4-6 weeks.   EXERCISES  Results after joint replacement surgery are often greatly improved when you follow the exercise, range of motion and muscle strengthening exercises prescribed by your doctor. Safety measures are also important to protect the joint from further injury. Any time any of these exercises cause you to have increased pain or swelling, decrease what you are doing until you are comfortable again and then slowly increase them. If you have problems or questions, call your caregiver or physical therapist for advice.   Rehabilitation is important following a joint replacement. After just a few days of immobilization, the muscles of the leg can become weakened and shrink (atrophy).  These exercises are designed to build up the tone and strength of the thigh and leg muscles and to improve motion. Often times heat used for twenty to thirty minutes before working out will loosen up your tissues and help with improving the range of motion but do not use heat for the first two weeks following surgery (sometimes heat can increase post-operative swelling).   These exercises can be done on a training (exercise) mat, on the floor, on a table or on a bed. Use whatever works the best and is most comfortable for you.    Use music or television while you are exercising so that the exercises are a pleasant break in your day. This will make your life better with the exercises  acting as a break in your routine that you can look forward to.   Perform all exercises about fifteen times, three times per day or as directed.  You should exercise both the operative leg and the other leg as well.  Exercises include:   Quad Sets - Tighten up the muscle on the front of the thigh (Quad) and hold for 5-10 seconds.   Straight Leg Raises - With your knee straight (if you were given a brace, keep it on), lift the leg to 60 degrees, hold for 3 seconds, and slowly lower the leg.  Perform this exercise against resistance later as your leg gets stronger.  Leg Slides: Lying on your back, slowly slide your foot toward your buttocks, bending your knee up off the floor (only go as far as is comfortable). Then slowly slide your foot back down until your leg is flat on the floor again.  Angel Wings: Lying on your back spread your legs to the side as far apart as you can without causing discomfort.  Hamstring Strength:  Lying on your back, push your heel against the floor with your leg straight by tightening up the muscles of your buttocks.  Repeat, but this time bend your knee to a comfortable angle, and push your heel against the floor.  You may put a pillow under the heel to make it more comfortable if necessary.   A rehabilitation program following joint replacement surgery can speed recovery and prevent re-injury in the future due to weakened muscles. Contact your doctor or a physical therapist for more information on knee rehabilitation.    CONSTIPATION  Constipation is defined medically as fewer than three stools per week and severe constipation as less than one stool per week.  Even if you have a regular bowel pattern at home, your normal regimen is likely to be disrupted due to multiple reasons following surgery.  Combination of anesthesia, postoperative narcotics, change in appetite and fluid intake all can affect your bowels.   YOU MUST use at least one of the following options; they  are listed in order of increasing strength to get the job done.  They are all available over the counter, and you may need to use some, POSSIBLY even all of these options:    Drink plenty of fluids (prune juice may be helpful) and high fiber foods Colace 100 mg by mouth twice a day  Senokot for constipation as directed and as needed Dulcolax (bisacodyl), take with full glass of water  Miralax (polyethylene glycol) once or twice a day as needed.  If you have tried all these things and are unable to have a bowel movement in the first 3-4 days after surgery call either your surgeon or your primary doctor.    If you experience loose stools or diarrhea, hold the medications until you stool forms back up.  If your symptoms do not get better within 1 week or if they get worse, check with your doctor.  If you experience "the worst abdominal pain ever" or develop nausea or vomiting, please contact the office immediately for further recommendations for treatment.   ITCHING:  If you experience itching with your medications, try taking only a single pain pill, or even half a pain pill at a time.  You can also use Benadryl over the counter for itching or also to help with sleep.   TED HOSE STOCKINGS:  Use stockings on both legs until for at least 2 weeks or as directed by physician office. They may be removed at night for sleeping.  MEDICATIONS:  See your medication summary on the "After Visit Summary" that nursing will review with you.  You may have some home medications which will be placed on hold until you complete the course of blood thinner medication.  It is important for you to complete the blood thinner medication as prescribed.  Take medicines as prescribed.   You have several different medicines that work in different ways. - Tylenol is for mild to moderate pain. Try to take this medicine before turning to your narcotic medicines.  - Robaxin is for muscle spasms. This medicine can make you  drowsy. - Oxycodone is a narcotic pain medicine.  Take this for severe pain. This medicine can be dehydrating / constipating. - Zofran is for nausea and vomiting.  - Coumadin is to prevent blood clots after surgery.   PRECAUTIONS:  If you  experience chest pain or shortness of breath - call 911 immediately for transfer to the hospital emergency department.   If you develop a fever greater that 101 F, purulent drainage from wound, increased redness or drainage from wound, foul odor from the wound/dressing, or calf pain - CONTACT YOUR SURGEON.                                                   FOLLOW-UP APPOINTMENTS:  If you do not already have a post-op appointment, please call the office [phone] for an appointment to be seen by Dr. Percell Miller in 2 weeks.   OTHER INSTRUCTIONS:   MAKE SURE YOU:  Understand these instructions.  Get help right away if you are not doing well or get worse.    Thank you for letting us be a part of your medical care team.  It is a privilege we respect greatly.  We hope these instructions will help you stay on track for a fast and full recovery!   Do not put a pillow under the knee. Place it under the heel.   Complete by: As directed    Driving restrictions   Complete by: As directed    No driving for 2-6 weeks   Post-operative opioid taper instructions:   Complete by: As directed    POST-OPERATIVE OPIOID TAPER INSTRUCTIONS: It is important to wean off of your opioid medication as soon as possible. If you do not need pain medication after your surgery it is ok to stop day one. Opioids include: Codeine, Hydrocodone(Norco, Vicodin), Oxycodone(Percocet, oxycontin) and hydromorphone amongst others.  Long term and even short term use of opiods can cause: Increased pain response Dependence Constipation Depression Respiratory depression And more.  Withdrawal symptoms can include Flu like symptoms Nausea, vomiting And more Techniques to manage these  symptoms Hydrate well Eat regular healthy meals Stay active Use relaxation techniques(deep breathing, meditating, yoga) Do Not substitute Alcohol to help with tapering If you have been on opioids for less than two weeks and do not have pain than it is ok to stop all together.  Plan to wean off of opioids This plan should start within one week post op of your joint replacement. Maintain the same interval or time between taking each dose and first decrease the dose.  Cut the total daily intake of opioids by one tablet each day Next start to increase the time between doses. The last dose that should be eliminated is the evening dose.      TED hose   Complete by: As directed    Use stockings (TED hose) for 2 weeks on right leg(s).  You may remove them at night for sleeping.   Weight bearing as tolerated   Complete by: As directed       Allergies as of 07/30/2022   No Known Allergies      Medication List     TAKE these medications    Acetaminophen Extra Strength 500 MG Tabs Take 2 tablets (1,000 mg total) by mouth 4 (four) times daily. What changed:  when to take this reasons to take this   albuterol 108 (90 Base) MCG/ACT inhaler Commonly known as: VENTOLIN HFA Inhale 2 puffs into the lungs every 6 (six) hours as needed for wheezing or shortness of breath.   atorvastatin 40 MG tablet Commonly known as:  LIPITOR Take 1 tablet (40 mg total) by mouth daily.   carvedilol 6.25 MG tablet Commonly known as: COREG Take 1 tablet (6.25 mg total) by mouth 2 (two) times daily with a meal.   docusate sodium 100 MG capsule Commonly known as: COLACE Take 1 capsule (100 mg total) by mouth 2 (two) times daily.   finasteride 5 MG tablet Commonly known as: PROSCAR Take 1 tablet (5 mg total) by mouth daily.   loratadine 10 MG tablet Commonly known as: CLARITIN Take 10 mg by mouth daily as needed (wheezing).   losartan 50 MG tablet Commonly known as: COZAAR TAKE 1 TABLET EVERY  DAY   methocarbamol 750 MG tablet Commonly known as: Robaxin-750 Take 1 tablet (750 mg total) by mouth every 8 (eight) hours as needed for muscle spasms.   ondansetron 4 MG disintegrating tablet Commonly known as: ZOFRAN-ODT Take 1 tablet (4 mg total) by mouth every 8 (eight) hours as needed for nausea or vomiting.   oxyCODONE 5 MG immediate release tablet Commonly known as: Roxicodone Take 1 tablet (5 mg total) by mouth every 4 (four) hours as needed for severe pain.   Primatene 12.5 MG Tabs Generic drug: ePHEDrine HCl Take 12.5 mg by mouth daily as needed (wheezing).   silodosin 8 MG Caps capsule Commonly known as: RAPAFLO Take 1 capsule (8 mg total) by mouth daily with breakfast.   warfarin 5 MG tablet Commonly known as: COUMADIN Take as directed. If you are unsure how to take this medication, talk to your nurse or doctor. Original instructions: Take 0.5 tablets (2.5 mg total) by mouth daily.               Discharge Care Instructions  (From admission, onward)           Start     Ordered   07/30/22 0000  Weight bearing as tolerated        07/30/22 1350            Follow-up Information     Renette Butters, MD. Go on 08/13/2022.   Specialty: Orthopedic Surgery Why: Your appointment is scheduled for 4:15. Contact information: [address]  [place] [address] [phone]         [facility] Follow up.   Specialty: [place] Why: HHPT will provide 6 home visits prior to starting outpaitent physical therapy Contact information: [address]  [place] [address] [phone]         [facility], [place]. Go on 08/13/2022.   Why: Your outpatient physical therapy appointment is scheduled for 10:15. Please arrive at 10:00 to complete your paperwork Contact information: [facility] [place] [address] [phone]                  Signed: Alisa Graff 07/30/2022, 1:50 PM

## 2022-07-30 NOTE — Progress Notes (Signed)
ANTICOAGULATION CONSULT NOTE - follow up  Pharmacy Consult for Warfarin Indication: atrial fibrillation  No Known Allergies  Patient Measurements: Height: '5\' 6"'$  (167.6 cm) Weight: 65.8 kg (145 lb) IBW/kg (Calculated) : 63.8  Vital Signs: Temp: 97.4 F (36.3 C) (01/31 0530) Temp Source: Oral (01/31 0530) BP: 146/94 (01/31 0530) Pulse Rate: 89 (01/31 0530)  Labs: Recent Labs    07/29/22 0830 07/30/22 0323  APTT 32  --   LABPROT 15.8* 15.6*  INR 1.3* 1.3*     Estimated Creatinine Clearance: 57.3 mL/min (by C-G formula based on SCr of 1.02 mg/dL).   Medical History: Past Medical History:  Diagnosis Date   Allergic rhinitis 08/08/2013   takes Loratadine daily    Aortic stenosis 09/08/2011   With mild aortic regurgitation, mean gradient 13 mmHg    Cataract    right but immature   CHF (congestive heart failure) ([place]) 3567   Complication of anesthesia    stopped breathing - during shoulder surgery 2009-2010   Coronary artery disease 10/02/2009   Cardiac cath (October 2007): 20% left main, 60% mid LAD, 60-70% distal LAD, 30-40% first diagonal, 30% circumflex, 40% OM, 30-40% RCA    Degenerative joint disease of shoulder 08/03/2013   Bilateral, s/p right total shoulder arthroplasty 05/29/2011 and left shoulder arthroplasty 01/41/0301   Diastolic dysfunction 31/43/8887   Echo (04/04/2016): Grade I   Diverticulosis 10/07/2013   Seen on colonoscopy in 2007    Erectile dysfunction 05/07/2009   Essential hypertension 08/03/2013   had been on Lisinopril but stopped per MD   First degree AV block 03/14/2016   Gastric ulcer 10/07/2013   Seen on EGD 04/10/2006    Hemorrhoids 10/07/2013   Hyperlipidemia 10/02/2009   Paroxysmal atrial fibrillation ([place]) 07/14/2006   One episode, provoked by alcohol use disorder, briefly anticoagulated with warfarin, then switched to aspirin alone   Pre-diabetes    Sciatica associated with disorder of lumbar spine 08/03/2013   Anterolisthesis  with right L 4-5 nerve root compression.  Treated with epidural injections and Physical Therapy.    Seborrheic keratosis 10/07/2013   Tubular adenoma of colon     Medications:  Medications Prior to Admission  Medication Sig Dispense Refill Last Dose   acetaminophen (TYLENOL) 500 MG tablet Take 2 tablets (1,000 mg total) by mouth 4 (four) times daily. (Patient taking differently: Take 1,000 mg by mouth every 6 (six) hours as needed for moderate pain.) 120 tablet 3 07/28/2022   atorvastatin (LIPITOR) 40 MG tablet Take 1 tablet (40 mg total) by mouth daily. 30 tablet 5 07/28/2022   carvedilol (COREG) 6.25 MG tablet Take 1 tablet (6.25 mg total) by mouth 2 (two) times daily with a meal. 30 tablet 5 07/29/2022 at 0300   docusate sodium (COLACE) 100 MG capsule Take 1 capsule (100 mg total) by mouth 2 (two) times daily. 60 capsule 1 07/29/2022 at 0300   ePHEDrine HCl (PRIMATENE) 12.5 MG TABS Take 12.5 mg by mouth daily as needed (wheezing).   Past Month   finasteride (PROSCAR) 5 MG tablet Take 1 tablet (5 mg total) by mouth daily. 30 tablet 5 07/29/2022 at 0300   losartan (COZAAR) 50 MG tablet TAKE 1 TABLET EVERY DAY 90 tablet 3 07/28/2022   silodosin (RAPAFLO) 8 MG CAPS capsule Take 1 capsule (8 mg total) by mouth daily with breakfast. 30 capsule 3 07/28/2022   warfarin (COUMADIN) 5 MG tablet Take 0.5 tablets (2.5 mg total) by mouth daily.   07/24/2022   albuterol (VENTOLIN  HFA) 108 (90 Base) MCG/ACT inhaler Inhale 2 puffs into the lungs every 6 (six) hours as needed for wheezing or shortness of breath. 8 g 2 More than a month   loratadine (CLARITIN) 10 MG tablet Take 10 mg by mouth daily as needed (wheezing).   More than a month   Scheduled:   atorvastatin  40 mg Oral Daily   carvedilol  6.25 mg Oral BID WC   dexamethasone (DECADRON) injection  10 mg Intravenous Once   docusate sodium  100 mg Oral BID   finasteride  5 mg Oral Daily   losartan  50 mg Oral Daily   pantoprazole  40 mg Oral Daily    tamsulosin  0.4 mg Oral QPC breakfast   traMADol  50 mg Oral Q6H    Assessment: 76 yoM admitted on 1/30 s/p right total knee arthroplasty.  He has a history of Afib on warfarin which was held for surgery.  Ortho planning to resume warfarin on POD1.   INR: 1.3 Baseline CBC: Hgb and Plt WNL (1/22)  Most recent anticoag visit on 1/17:  warfarin 1/2 tablet (2.'5mg'$ ) daily.  Hold Warfarin 5 day prior to procedure, restart postop, recheck INR in 1 week.  To begin warfarin dosing today POD1, INR subtherapeutic as expected Check CBC tomorrow 2/1 No reported bleeding  Goal of Therapy:  INR 2-3 Monitor platelets by anticoagulation protocol: Yes   Plan:  Warfarin '4mg'$  today at 1600 Daily PT/INR. Monitor for signs and symptoms of bleeding.   Adrian Saran, PharmD, BCPS Secure Chat if ?s 07/30/2022 7:42 AM

## 2022-07-30 NOTE — Anesthesia Postprocedure Evaluation (Signed)
Anesthesia Post Note  Patient: [name]  Procedure(s) Performed: TOTAL KNEE ARTHROPLASTY (Right: Knee)     Patient location during evaluation: PACU Anesthesia Type: Regional and General Level of consciousness: awake Pain management: pain level controlled Vital Signs Assessment: post-procedure vital signs reviewed and stable Respiratory status: spontaneous breathing, nonlabored ventilation and respiratory function stable Cardiovascular status: blood pressure returned to baseline and stable Postop Assessment: no apparent nausea or vomiting Anesthetic complications: no   No notable events documented.  Last Vitals:  Vitals:   07/30/22 0144 07/30/22 0530  BP: (!) 147/86 (!) 146/94  Pulse: (!) 101 89  Resp: 17 17  Temp: (!) 36.4 C (!) 36.3 C  SpO2: 91% 97%    Last Pain:  Vitals:   07/30/22 0530  TempSrc: Oral  PainSc:                  Layne Lebon P Chasta Deshpande

## 2022-07-30 NOTE — Plan of Care (Signed)
  Problem: Education: ?Goal: Knowledge of the prescribed therapeutic regimen will improve ?Outcome: Progressing ?Goal: Individualized Educational Video(s) ?Outcome: Progressing ?  ?Problem: Activity: ?Goal: Ability to avoid complications of mobility impairment will improve ?Outcome: Progressing ?Goal: Range of joint motion will improve ?Outcome: Progressing ?  ?Problem: Clinical Measurements: ?Goal: Postoperative complications will be avoided or minimized ?Outcome: Progressing ?  ?Problem: Pain Management: ?Goal: Pain level will decrease with appropriate interventions ?Outcome: Progressing ?  ?Problem: Skin Integrity: ?Goal: Will show signs of wound healing ?Outcome: Progressing ?  ?Problem: Education: ?Goal: Knowledge of General Education information will improve ?Description: Including pain rating scale, medication(s)/side effects and non-pharmacologic comfort measures ?Outcome: Progressing ?  ?Problem: Health Behavior/Discharge Planning: ?Goal: Ability to manage health-related needs will improve ?Outcome: Progressing ?  ?Problem: Clinical Measurements: ?Goal: Ability to maintain clinical measurements within normal limits will improve ?Outcome: Progressing ?Goal: Will remain free from infection ?Outcome: Progressing ?Goal: Diagnostic test results will improve ?Outcome: Progressing ?Goal: Respiratory complications will improve ?Outcome: Progressing ?Goal: Cardiovascular complication will be avoided ?Outcome: Progressing ?  ?Problem: Activity: ?Goal: Risk for activity intolerance will decrease ?Outcome: Progressing ?  ?Problem: Nutrition: ?Goal: Adequate nutrition will be maintained ?Outcome: Progressing ?  ?Problem: Coping: ?Goal: Level of anxiety will decrease ?Outcome: Progressing ?  ?Problem: Elimination: ?Goal: Will not experience complications related to bowel motility ?Outcome: Progressing ?Goal: Will not experience complications related to urinary retention ?Outcome: Progressing ?  ?Problem: Pain  Managment: ?Goal: General experience of comfort will improve ?Outcome: Progressing ?  ?Problem: Safety: ?Goal: Ability to remain free from injury will improve ?Outcome: Progressing ?  ?Problem: Skin Integrity: ?Goal: Risk for impaired skin integrity will decrease ?Outcome: Progressing ?  ?

## 2022-07-31 ENCOUNTER — Telehealth: Payer: Self-pay

## 2022-07-31 DIAGNOSIS — I429 Cardiomyopathy, unspecified: Secondary | ICD-10-CM | POA: Diagnosis not present

## 2022-07-31 DIAGNOSIS — I2510 Atherosclerotic heart disease of native coronary artery without angina pectoris: Secondary | ICD-10-CM | POA: Diagnosis not present

## 2022-07-31 DIAGNOSIS — I480 Paroxysmal atrial fibrillation: Secondary | ICD-10-CM | POA: Diagnosis not present

## 2022-07-31 DIAGNOSIS — J449 Chronic obstructive pulmonary disease, unspecified: Secondary | ICD-10-CM | POA: Diagnosis not present

## 2022-07-31 DIAGNOSIS — Z471 Aftercare following joint replacement surgery: Secondary | ICD-10-CM | POA: Diagnosis not present

## 2022-07-31 DIAGNOSIS — I509 Heart failure, unspecified: Secondary | ICD-10-CM | POA: Diagnosis not present

## 2022-07-31 DIAGNOSIS — J439 Emphysema, unspecified: Secondary | ICD-10-CM | POA: Diagnosis not present

## 2022-07-31 DIAGNOSIS — I440 Atrioventricular block, first degree: Secondary | ICD-10-CM | POA: Diagnosis not present

## 2022-07-31 DIAGNOSIS — I110 Hypertensive heart disease with heart failure: Secondary | ICD-10-CM | POA: Diagnosis not present

## 2022-07-31 NOTE — Patient Outreach (Signed)
  Care Coordination TOC Note Transition Care Management Follow-up Telephone Call Date of discharge and from where: [name] How have you been since you were released from the hospital? "I am doing fine, I overdid my therapy and am in pain". Any questions or concerns? No  Items Reviewed: Did the pt receive and understand the discharge instructions provided? Yes  Medications obtained and verified? Yes  Other? No  Any new allergies since your discharge? No  Dietary orders reviewed? Yes Do you have support at home? Yes   Home Care and Equipment/Supplies: Were home health services ordered? yes If so, what is the name of the agency? [facility]  Has the agency set up a time to come to the patient's home? yes Were any new equipment or medical supplies ordered?  No What is the name of the medical supply agency? N/a Were you able to get the supplies/equipment? not applicable Do you have any questions related to the use of the equipment or supplies? No  Functional Questionnaire: (I = Independent and D = Dependent) ADLs: Needs assistance  Bathing/Dressing- Needs assistance  Meal Prep- D  Eating- I  Maintaining continence- I  Transferring/Ambulation- needs assistance  Managing Meds- I  Follow up appointments reviewed:  PCP Hospital f/u appt confirmed? No   Specialist Hospital f/u appt confirmed? Yes  Scheduled to see Dr. Percell Miller (ortho) on 08/13/22 @ 4:15. Are transportation arrangements needed? No  If their condition worsens, is the pt aware to call PCP or go to the Emergency Dept.? Yes Was the patient provided with contact information for the PCP's office or ED? Yes Was to pt encouraged to call back with questions or concerns? Yes  SDOH assessments and interventions completed:   Yes SDOH Interventions Today    Flowsheet Row Most Recent Value  SDOH Interventions   Food Insecurity Interventions Intervention Not Indicated  Housing Interventions Intervention Not  Indicated       Care Coordination Interventions:  No Care Coordination interventions needed at this time.   Encounter Outcome:  Pt. Visit Completed

## 2022-08-01 ENCOUNTER — Other Ambulatory Visit: Payer: Self-pay | Admitting: Student in an Organized Health Care Education/Training Program

## 2022-08-02 DIAGNOSIS — I480 Paroxysmal atrial fibrillation: Secondary | ICD-10-CM | POA: Diagnosis not present

## 2022-08-02 DIAGNOSIS — Z471 Aftercare following joint replacement surgery: Secondary | ICD-10-CM | POA: Diagnosis not present

## 2022-08-02 DIAGNOSIS — J449 Chronic obstructive pulmonary disease, unspecified: Secondary | ICD-10-CM | POA: Diagnosis not present

## 2022-08-02 DIAGNOSIS — I110 Hypertensive heart disease with heart failure: Secondary | ICD-10-CM | POA: Diagnosis not present

## 2022-08-02 DIAGNOSIS — I429 Cardiomyopathy, unspecified: Secondary | ICD-10-CM | POA: Diagnosis not present

## 2022-08-02 DIAGNOSIS — I440 Atrioventricular block, first degree: Secondary | ICD-10-CM | POA: Diagnosis not present

## 2022-08-02 DIAGNOSIS — J439 Emphysema, unspecified: Secondary | ICD-10-CM | POA: Diagnosis not present

## 2022-08-02 DIAGNOSIS — I2510 Atherosclerotic heart disease of native coronary artery without angina pectoris: Secondary | ICD-10-CM | POA: Diagnosis not present

## 2022-08-02 DIAGNOSIS — I509 Heart failure, unspecified: Secondary | ICD-10-CM | POA: Diagnosis not present

## 2022-08-04 DIAGNOSIS — Z471 Aftercare following joint replacement surgery: Secondary | ICD-10-CM | POA: Diagnosis not present

## 2022-08-04 DIAGNOSIS — I2510 Atherosclerotic heart disease of native coronary artery without angina pectoris: Secondary | ICD-10-CM | POA: Diagnosis not present

## 2022-08-04 DIAGNOSIS — J439 Emphysema, unspecified: Secondary | ICD-10-CM | POA: Diagnosis not present

## 2022-08-04 DIAGNOSIS — I440 Atrioventricular block, first degree: Secondary | ICD-10-CM | POA: Diagnosis not present

## 2022-08-04 DIAGNOSIS — I480 Paroxysmal atrial fibrillation: Secondary | ICD-10-CM | POA: Diagnosis not present

## 2022-08-04 DIAGNOSIS — I509 Heart failure, unspecified: Secondary | ICD-10-CM | POA: Diagnosis not present

## 2022-08-04 DIAGNOSIS — I110 Hypertensive heart disease with heart failure: Secondary | ICD-10-CM | POA: Diagnosis not present

## 2022-08-04 DIAGNOSIS — I429 Cardiomyopathy, unspecified: Secondary | ICD-10-CM | POA: Diagnosis not present

## 2022-08-04 DIAGNOSIS — J449 Chronic obstructive pulmonary disease, unspecified: Secondary | ICD-10-CM | POA: Diagnosis not present

## 2022-08-06 DIAGNOSIS — J439 Emphysema, unspecified: Secondary | ICD-10-CM | POA: Diagnosis not present

## 2022-08-06 DIAGNOSIS — I480 Paroxysmal atrial fibrillation: Secondary | ICD-10-CM | POA: Diagnosis not present

## 2022-08-06 DIAGNOSIS — I2510 Atherosclerotic heart disease of native coronary artery without angina pectoris: Secondary | ICD-10-CM | POA: Diagnosis not present

## 2022-08-06 DIAGNOSIS — I110 Hypertensive heart disease with heart failure: Secondary | ICD-10-CM | POA: Diagnosis not present

## 2022-08-06 DIAGNOSIS — Z471 Aftercare following joint replacement surgery: Secondary | ICD-10-CM | POA: Diagnosis not present

## 2022-08-06 DIAGNOSIS — I509 Heart failure, unspecified: Secondary | ICD-10-CM | POA: Diagnosis not present

## 2022-08-06 DIAGNOSIS — I429 Cardiomyopathy, unspecified: Secondary | ICD-10-CM | POA: Diagnosis not present

## 2022-08-06 DIAGNOSIS — I440 Atrioventricular block, first degree: Secondary | ICD-10-CM | POA: Diagnosis not present

## 2022-08-06 DIAGNOSIS — J449 Chronic obstructive pulmonary disease, unspecified: Secondary | ICD-10-CM | POA: Diagnosis not present

## 2022-08-07 ENCOUNTER — Ambulatory Visit: Payer: Medicare HMO | Attending: Cardiology

## 2022-08-07 DIAGNOSIS — I4891 Unspecified atrial fibrillation: Secondary | ICD-10-CM | POA: Diagnosis not present

## 2022-08-07 DIAGNOSIS — Z5181 Encounter for therapeutic drug level monitoring: Secondary | ICD-10-CM | POA: Diagnosis not present

## 2022-08-07 LAB — POCT INR: INR: 1.5 — AB (ref 2.0–3.0)

## 2022-08-07 NOTE — Patient Instructions (Addendum)
Description   Take 1 tablet today and 1 tablet tomorrow and then continue taking warfarin 1/2 tablet (2.'5mg'$ ) daily  Remain consistent with leafy veggies (2-3 per week) Protein drinks (2-3 per week) - Make Coumadin Clinic aware when you stop.  Recheck INR 1 week.  Coumadin Clinic [phone]

## 2022-08-08 DIAGNOSIS — I480 Paroxysmal atrial fibrillation: Secondary | ICD-10-CM | POA: Diagnosis not present

## 2022-08-08 DIAGNOSIS — I110 Hypertensive heart disease with heart failure: Secondary | ICD-10-CM | POA: Diagnosis not present

## 2022-08-08 DIAGNOSIS — I429 Cardiomyopathy, unspecified: Secondary | ICD-10-CM | POA: Diagnosis not present

## 2022-08-08 DIAGNOSIS — I509 Heart failure, unspecified: Secondary | ICD-10-CM | POA: Diagnosis not present

## 2022-08-08 DIAGNOSIS — I2510 Atherosclerotic heart disease of native coronary artery without angina pectoris: Secondary | ICD-10-CM | POA: Diagnosis not present

## 2022-08-08 DIAGNOSIS — J449 Chronic obstructive pulmonary disease, unspecified: Secondary | ICD-10-CM | POA: Diagnosis not present

## 2022-08-08 DIAGNOSIS — I440 Atrioventricular block, first degree: Secondary | ICD-10-CM | POA: Diagnosis not present

## 2022-08-08 DIAGNOSIS — Z471 Aftercare following joint replacement surgery: Secondary | ICD-10-CM | POA: Diagnosis not present

## 2022-08-08 DIAGNOSIS — J439 Emphysema, unspecified: Secondary | ICD-10-CM | POA: Diagnosis not present

## 2022-08-11 DIAGNOSIS — J449 Chronic obstructive pulmonary disease, unspecified: Secondary | ICD-10-CM | POA: Diagnosis not present

## 2022-08-11 DIAGNOSIS — I480 Paroxysmal atrial fibrillation: Secondary | ICD-10-CM | POA: Diagnosis not present

## 2022-08-11 DIAGNOSIS — J439 Emphysema, unspecified: Secondary | ICD-10-CM | POA: Diagnosis not present

## 2022-08-11 DIAGNOSIS — Z471 Aftercare following joint replacement surgery: Secondary | ICD-10-CM | POA: Diagnosis not present

## 2022-08-11 DIAGNOSIS — I440 Atrioventricular block, first degree: Secondary | ICD-10-CM | POA: Diagnosis not present

## 2022-08-11 DIAGNOSIS — I429 Cardiomyopathy, unspecified: Secondary | ICD-10-CM | POA: Diagnosis not present

## 2022-08-11 DIAGNOSIS — I2510 Atherosclerotic heart disease of native coronary artery without angina pectoris: Secondary | ICD-10-CM | POA: Diagnosis not present

## 2022-08-11 DIAGNOSIS — I509 Heart failure, unspecified: Secondary | ICD-10-CM | POA: Diagnosis not present

## 2022-08-11 DIAGNOSIS — I110 Hypertensive heart disease with heart failure: Secondary | ICD-10-CM | POA: Diagnosis not present

## 2022-08-13 ENCOUNTER — Telehealth: Payer: Self-pay | Admitting: Internal Medicine

## 2022-08-13 DIAGNOSIS — M1711 Unilateral primary osteoarthritis, right knee: Secondary | ICD-10-CM | POA: Diagnosis not present

## 2022-08-13 NOTE — Telephone Encounter (Signed)
Reviewed schedule for tomorrow.  To the best of my knowledge the patient should not need this appointment.  I think it was made before my last contact with him and not canceled.  I left he and his wife messages.  I am happy to see him if he feels the need but if he is not having bile duct symptoms there is no need for follow-up tomorrow he can be seen as needed.

## 2022-08-13 NOTE — Telephone Encounter (Signed)
Pt notified of Dr. Carlean Purl recommendations. Pt stated that he is not having any symptoms. Appointment was canceled. Pt made aware.  Pt verbalized understanding with all questions answered.

## 2022-08-14 DIAGNOSIS — M25661 Stiffness of right knee, not elsewhere classified: Secondary | ICD-10-CM | POA: Diagnosis not present

## 2022-08-14 DIAGNOSIS — M6281 Muscle weakness (generalized): Secondary | ICD-10-CM | POA: Diagnosis not present

## 2022-08-14 DIAGNOSIS — M1711 Unilateral primary osteoarthritis, right knee: Secondary | ICD-10-CM | POA: Diagnosis not present

## 2022-08-14 DIAGNOSIS — R262 Difficulty in walking, not elsewhere classified: Secondary | ICD-10-CM | POA: Diagnosis not present

## 2022-08-15 ENCOUNTER — Ambulatory Visit: Payer: Medicare HMO | Admitting: Internal Medicine

## 2022-08-18 ENCOUNTER — Ambulatory Visit: Payer: Medicare HMO | Attending: Cardiovascular Disease | Admitting: *Deleted

## 2022-08-18 DIAGNOSIS — I4891 Unspecified atrial fibrillation: Secondary | ICD-10-CM

## 2022-08-18 DIAGNOSIS — Z5181 Encounter for therapeutic drug level monitoring: Secondary | ICD-10-CM

## 2022-08-18 LAB — POCT INR: INR: 2.4 (ref 2.0–3.0)

## 2022-08-18 NOTE — Patient Instructions (Addendum)
Description   Continue taking warfarin 1/2 tablet (2.48m) daily. Remain consistent with leafy veggies (2-3 per week). Protein drinks (2-3 per week)-Make Coumadin Clinic aware when you stop.  Recheck INR 3 weeks per pt request. Coumadin Clinic [phone]

## 2022-08-27 DIAGNOSIS — N401 Enlarged prostate with lower urinary tract symptoms: Secondary | ICD-10-CM | POA: Diagnosis not present

## 2022-08-27 DIAGNOSIS — R3914 Feeling of incomplete bladder emptying: Secondary | ICD-10-CM | POA: Diagnosis not present

## 2022-09-03 NOTE — Progress Notes (Signed)
HPI: FU NICM improved by most recent echocardiogram and AS. He also has a history of coronary artery disease with cardiomyopathy out of proportion, alcohol use, now resolved and paroxysmal atrial fibrillation. Cardiac catheterization in October of 2007 showed a 20% left main, a 60% mid LAD and a 60-70% distal LAD. There was a 30-40% first diagonal. There was a 30% circumflex and a 40% OM. There was a 30-40% right coronary artery. Ejection fraction was 25-30%. Abdominal ultrasound in February 2012 showed no aneurysm. ABIs October 2017 normal. Patient found to have recurrent atrial fibrillation September 2020 that was asymptomatic. Pt underwent successful cardioversion February 2021. Last echocardiogram July 2023 showed normal LV function, moderate left atrial enlargement, mild mitral regurgitation, mild aortic insufficiency and moderate aortic stenosis (mean gradient 17.3 mmHg with valve area 1.1 cm). Since he was last seen, he notes some increased dyspnea on exertion since January when he had his knee replaced.  No orthopnea, PND, chest pain or syncope.  Mild edema in the right lower extremity.  Current Outpatient Medications  Medication Sig Dispense Refill   acetaminophen (TYLENOL) 500 MG tablet Take 2 tablets (1,000 mg total) by mouth 4 (four) times daily. (Patient taking differently: Take 1,000 mg by mouth every 6 (six) hours as needed for moderate pain.) 120 tablet 3   albuterol (VENTOLIN HFA) 108 (90 Base) MCG/ACT inhaler Inhale 2 puffs into the lungs every 6 (six) hours as needed for wheezing or shortness of breath. 8 g 2   allopurinol (ZYLOPRIM) 100 MG tablet Take 1 tablet (100 mg total) by mouth daily. 90 tablet 3   atorvastatin (LIPITOR) 40 MG tablet Take 1 tablet (40 mg total) by mouth daily. 30 tablet 5   carvedilol (COREG) 6.25 MG tablet Take 1 tablet (6.25 mg total) by mouth 2 (two) times daily with a meal. 30 tablet 5   docusate sodium (COLACE) 100 MG capsule Take 1 capsule (100 mg  total) by mouth 2 (two) times daily. 60 capsule 1   ePHEDrine HCl (PRIMATENE) 12.5 MG TABS Take 12.5 mg by mouth daily as needed (wheezing).     finasteride (PROSCAR) 5 MG tablet Take 1 tablet (5 mg total) by mouth daily. 30 tablet 5   loratadine (CLARITIN) 10 MG tablet Take 10 mg by mouth daily as needed (wheezing).     losartan (COZAAR) 50 MG tablet TAKE 1 TABLET EVERY DAY 90 tablet 3   methocarbamol (ROBAXIN-750) 750 MG tablet Take 1 tablet (750 mg total) by mouth every 8 (eight) hours as needed for muscle spasms. 20 tablet 0   ondansetron (ZOFRAN-ODT) 4 MG disintegrating tablet Take 1 tablet (4 mg total) by mouth every 8 (eight) hours as needed for nausea or vomiting. 15 tablet 0   oxyCODONE (ROXICODONE) 5 MG immediate release tablet Take 1 tablet (5 mg total) by mouth every 4 (four) hours as needed for severe pain. 30 tablet 0   silodosin (RAPAFLO) 8 MG CAPS capsule Take 1 capsule (8 mg total) by mouth daily with breakfast. 30 capsule 3   warfarin (COUMADIN) 5 MG tablet Take 0.5 tablets (2.5 mg total) by mouth daily.     No current facility-administered medications for this visit.     Past Medical History:  Diagnosis Date   Allergic rhinitis 08/08/2013   takes Loratadine daily    Aortic stenosis 09/08/2011   With mild aortic regurgitation, mean gradient 13 mmHg    Cataract    right but immature   CHF (congestive heart  failure) ([place]) [id number]   Complication of anesthesia    stopped breathing - during shoulder surgery 2009-2010   Coronary artery disease 10/02/2009   Cardiac cath (October 2007): 20% left main, 60% mid LAD, 60-70% distal LAD, 30-40% first diagonal, 30% circumflex, 40% OM, 30-40% RCA    Degenerative joint disease of shoulder 08/03/2013   Bilateral, s/p right total shoulder arthroplasty 05/29/2011 and left shoulder arthroplasty [id number]   Diastolic dysfunction [id number]   Echo (04/04/2016): Grade I   Diverticulosis 10/07/2013   Seen on colonoscopy in 2007    Erectile  dysfunction 05/07/2009   Essential hypertension 08/03/2013   had been on Lisinopril but stopped per MD   First degree AV block 03/14/2016   Gastric ulcer 10/07/2013   Seen on EGD 04/10/2006    Hemorrhoids 10/07/2013   Hyperlipidemia 10/02/2009   Paroxysmal atrial fibrillation ([place]) 07/14/2006   One episode, provoked by alcohol use disorder, briefly anticoagulated with warfarin, then switched to aspirin alone   Pre-diabetes    Sciatica associated with disorder of lumbar spine 08/03/2013   Anterolisthesis with right L 4-5 nerve root compression.  Treated with epidural injections and Physical Therapy.    Seborrheic keratosis 10/07/2013   Tubular adenoma of colon     Past Surgical History:  Procedure Laterality Date   BIOPSY  06/02/2022   Procedure: BIOPSY;  Surgeon: Rush Landmark Telford Nab., MD;  Location: [facility];  Service: Gastroenterology;;   CARDIAC CATHETERIZATION  2007   CARDIOVERSION     CARDIOVERSION N/A 08/04/2019   Procedure: CARDIOVERSION;  Surgeon: Fay Records, MD;  Location[facility];  Service: Cardiovascular;  Laterality: N/A;   CHOLECYSTECTOMY N/A 04/21/2022   Procedure: LAPAROSCOPIC CHOLECYSTECTOMY;  Surgeon: Jesusita Oka, MD;  Location: [place];  Service: General;  Laterality: N/A;   COLONOSCOPY     CYSTOSCOPY N/A 01/04/2022   Procedure: Consuela Mimes;  Surgeon: Raynelle Bring, MD;  Location: [place];  Service: Urology;  Laterality: N/A;   ENDOSCOPIC RETROGRADE CHOLANGIOPANCREATOGRAPHY (ERCP) WITH PROPOFOL N/A 06/02/2022   Procedure: ENDOSCOPIC RETROGRADE CHOLANGIOPANCREATOGRAPHY (ERCP) WITH PROPOFOL;  Surgeon: Rush Landmark Telford Nab., MD;  Location: [facility];  Service: Gastroenterology;  Laterality: N/A;   ERCP N/A 04/21/2022   Procedure: ATTEMPTED ENDOSCOPIC RETROGRADE CHOLANGIOPANCREATOGRAPHY (ERCP);  Surgeon: Gatha Mayer, MD;  Location: [place];  Service: Gastroenterology;  Laterality: N/A;   ESOPHAGOGASTRODUODENOSCOPY (EGD) WITH PROPOFOL N/A 06/02/2022    Procedure: ESOPHAGOGASTRODUODENOSCOPY (EGD) WITH PROPOFOL;  Surgeon: Rush Landmark Telford Nab., MD;  Location: [facility];  Service: Gastroenterology;  Laterality: N/A;   ESOPHAGOGASTRODUODENOSCOPY (EGD) WITH PROPOFOL N/A 07/22/2022   Procedure: ESOPHAGOGASTRODUODENOSCOPY (EGD) WITH PROPOFOL;  Surgeon: Gatha Mayer, MD;  Location: [facility];  Service: Gastroenterology;  Laterality: N/A;   EYE SURGERY Bilateral 2022   bilateral cataracts   HEMOSTASIS CLIP PLACEMENT  06/02/2022   Procedure: HEMOSTASIS CLIP PLACEMENT;  Surgeon: Irving Copas., MD;  Location: [facility];  Service: Gastroenterology;;   INSERTION OF MESH N/A 03/11/2019   Procedure: Insertion Of Mesh;  Surgeon: Ralene Ok, MD;  Location: [place];  Service: General;  Laterality: N/A;   INTRAOPERATIVE CHOLANGIOGRAM N/A 04/21/2022   Procedure: INTRAOPERATIVE CHOLANGIOGRAM;  Surgeon: Jesusita Oka, MD;  Location: [place];  Service: General;  Laterality: N/A;   IR EXCHANGE BILIARY DRAIN  02/14/2022   IR PERC CHOLECYSTOSTOMY  12/29/2021   JOINT REPLACEMENT     KNEE ARTHROSCOPY WITH MENISCAL REPAIR Right 01/30/2011   LAPAROTOMY N/A 01/04/2022   Procedure: EXPLORATORY LAPAROTOMY WITH EXTRAPERITONEAL BLADDER REPAIR;  Surgeon: Alinda Money,  Wynetta Emery, MD;  Location: [place];  Service: Urology;  Laterality: N/A;   PANCREATIC STENT PLACEMENT  06/02/2022   Procedure: PANCREATIC STENT PLACEMENT;  Surgeon: Rush Landmark Telford Nab., MD;  Location: [facility];  Service: Gastroenterology;;   PANCREATIC STENT PLACEMENT  07/22/2022   Procedure: PANCREATIC STENT REMOVAL;  Surgeon: Gatha Mayer, MD;  Location: [facility];  Service: Gastroenterology;;   POLYPECTOMY  06/02/2022   Procedure: POLYPECTOMY;  Surgeon: Irving Copas., MD;  Location: [facility];  Service: Gastroenterology;;   right finger surgery     as a child   SPHINCTEROTOMY  06/02/2022   Procedure: Joan Mayans;  Surgeon: Irving Copas., MD;  Location: [facility];  Service: Gastroenterology;;   TONSILLECTOMY     TOTAL KNEE ARTHROPLASTY Left 07/28/2017   Procedure: LEFT TOTAL KNEE ARTHROPLASTY;  Surgeon: Renette Butters, MD;  Location: [place];  Service: Orthopedics;  Laterality: Left;   TOTAL KNEE ARTHROPLASTY Right 07/29/2022   Procedure: TOTAL KNEE ARTHROPLASTY;  Surgeon: Renette Butters, MD;  Location: [facility];  Service: Orthopedics;  Laterality: Right;   TOTAL SHOULDER ARTHROPLASTY  05/29/2011   Procedure: TOTAL SHOULDER ARTHROPLASTY;  Surgeon: Metta Clines Supple;  Location: [place];  Service: Orthopedics;  Laterality: Right;   TOTAL SHOULDER ARTHROPLASTY Left 06/08/2014   DR SUPPLE   TOTAL SHOULDER ARTHROPLASTY Left 06/08/2014   Procedure: LEFT TOTAL SHOULDER ARTHROPLASTY;  Surgeon: Marin Shutter, MD;  Location: [place];  Service: Orthopedics;  Laterality: Left;   UMBILICAL HERNIA REPAIR N/A 03/11/2019   Procedure: LAPAROSCOPIC UMBILICAL HERNIA;  Surgeon: Ralene Ok, MD;  Location: [place];  Service: General;  Laterality: N/A;    Social History   Socioeconomic History   Marital status: Married    Spouse name: Marjolin- Margie   Number of children: 2   Years of education: Not on file   Highest education level: Not on file  Occupational History   Occupation: Retired  Tobacco Use   Smoking status: Former    Packs/day: 1.50    Years: 42.00    Additional pack years: 0.00    Total pack years: 63.00    Types: Cigarettes    Start date: 1970    Quit date: 07/14/2010    Years since quitting: 12.1   Smokeless tobacco: Never   Tobacco comments:    quit smoking about 34yr ago  Vaping Use   Vaping Use: Never used  Substance and Sexual Activity   Alcohol use: Not Currently    Comment: no alcohol since 07   Drug use: No   Sexual activity: Never    Birth control/protection: None  Other Topics Concern   Not on file  [place]   Social Determinants of Health   Financial Resource Strain: Low Risk  (06/04/2022)   Overall Financial Resource Strain (CARDIA)    Difficulty of Paying Living Expenses: Not hard at all  Food Insecurity: No Food Insecurity (07/31/2022)   Hunger Vital Sign    Worried About Running Out of Food in the Last Year: Never true    Ran Out of Food in the Last Year: Never true  Transportation Needs: No Transportation Needs (07/29/2022)   PRAPARE - THydrologist(Medical): No    Lack of Transportation (Non-Medical): No  Physical Activity: Not on file  Stress: Not on file  Social Connections: Not on file  Intimate Partner Violence: Not At Risk (07/29/2022)   Humiliation, Afraid, Rape, and Kick questionnaire    Fear of Current or Ex-Partner: No    Emotionally Abused: No    Physically Abused: No    Sexually Abused: No    Family History  Problem Relation Age of Onset   Bradycardia Mother 5       Requiring pacemeaker placement   Coronary artery disease Father 51       Died of myocardial infarction   Coronary artery disease Brother 45       Died of myocardial infarction   Obesity Daughter    Healthy Son    Colon cancer Neg Hx    Esophageal cancer Neg Hx    Stomach cancer Neg Hx    Colon polyps Neg Hx     ROS: no fevers or chills, productive cough, hemoptysis, dysphasia, odynophagia, melena, hematochezia, dysuria, hematuria, rash, seizure activity, orthopnea, PND, pedal edema, claudication. Remaining systems are negative.  Physical Exam: Well-developed well-nourished in no acute distress.  Skin is warm and dry.  HEENT is normal.  Neck is supple.  Chest is clear to auscultation with normal expansion.  Cardiovascular exam is irregular; 2/6 systolic murmur left sternal border. Abdominal exam nontender or distended. No masses palpated. Extremities show trace edema right lower extremity neuro grossly intact  ECG- personally reviewed  A/P  1 aortic  stenosis-plan follow-up echocardiogram July 2024.  Moderate on most recent study.  He notes some increased dyspnea on exertion but slowly improving since his knee was replaced.  He understands he will likely need to have his valve replaced in the future.  2 permanent atrial fibrillation-continue carvedilol and Coumadin with goal INR 2-3.  Patient previously declined DOAC's due to expense.  3 coronary artery disease-continue statin.  Patient is not on aspirin given need for anticoagulation.  4 hyperlipidemia-continue statin.  Check lipids.  Previously noted to have normal liver functions.  5 hypertension-blood pressure controlled.  Continue present medical regimen and follow-up.  6 history of cardiomyopathy-LV function has improved on most recent echocardiogram.  Kirk Ruths, MD

## 2022-09-11 ENCOUNTER — Ambulatory Visit (INDEPENDENT_AMBULATORY_CARE_PROVIDER_SITE_OTHER): Payer: Medicare HMO | Admitting: *Deleted

## 2022-09-11 ENCOUNTER — Ambulatory Visit: Payer: Medicare HMO | Attending: Cardiology | Admitting: Cardiology

## 2022-09-11 ENCOUNTER — Encounter: Payer: Self-pay | Admitting: Cardiology

## 2022-09-11 VITALS — BP 126/64 | HR 70 | Ht 66.0 in | Wt 155.0 lb

## 2022-09-11 DIAGNOSIS — I350 Nonrheumatic aortic (valve) stenosis: Secondary | ICD-10-CM | POA: Diagnosis not present

## 2022-09-11 DIAGNOSIS — Z5181 Encounter for therapeutic drug level monitoring: Secondary | ICD-10-CM

## 2022-09-11 DIAGNOSIS — I10 Essential (primary) hypertension: Secondary | ICD-10-CM

## 2022-09-11 DIAGNOSIS — I4891 Unspecified atrial fibrillation: Secondary | ICD-10-CM

## 2022-09-11 DIAGNOSIS — E785 Hyperlipidemia, unspecified: Secondary | ICD-10-CM

## 2022-09-11 DIAGNOSIS — I2510 Atherosclerotic heart disease of native coronary artery without angina pectoris: Secondary | ICD-10-CM | POA: Diagnosis not present

## 2022-09-11 LAB — LIPID PANEL
Chol/HDL Ratio: 3.1 ratio (ref 0.0–5.0)
Cholesterol, Total: 78 mg/dL — ABNORMAL LOW (ref 100–199)
HDL: 25 mg/dL — ABNORMAL LOW (ref >39)
LDL Chol Calc (NIH): 36 mg/dL (ref 0–99)
Triglycerides: 78 mg/dL (ref 0–149)
VLDL Cholesterol Cal: 17 mg/dL (ref 5–40)

## 2022-09-11 LAB — PROTIME-INR
INR: 5.7 (ref 0.9–1.2)
Prothrombin Time: 52.2 s — ABNORMAL HIGH (ref 9.1–12.0)

## 2022-09-11 LAB — POCT INR: INR: 6.2 — AB (ref 2.0–3.0)

## 2022-09-11 NOTE — Patient Instructions (Signed)
Description   Called pt and instructed to HOLD Warfarin today and tomorrow and then continue taking warfarin 1/2 tablet (2.'5mg'$ ) daily.  Remain consistent with leafy veggies (2 per week). Protein drinks (2-3 per week) -Make Coumadin Clinic aware when you stop protein drinks.  Recheck INR 1 week Coumadin Clinic [phone]

## 2022-09-11 NOTE — Patient Instructions (Signed)
  Testing/Procedures:  Your physician has requested that you have an echocardiogram. Echocardiography is a painless test that uses sound waves to create images of your heart. It provides your doctor with information about the size and shape of your heart and how well your heart's chambers and valves are working. This procedure takes approximately one hour. There are no restrictions for this procedure. Please do NOT wear cologne, perfume, aftershave, or lotions (deodorant is allowed). Please arrive 15 minutes prior to your appointment time. [address]   Follow-Up: At  [facility], you and your health needs are our priority.  As part of our continuing mission to provide you with exceptional heart care, we have created designated Provider Care Teams.  These Care Teams include your primary Cardiologist (physician) and Advanced Practice Providers (APPs -  Physician Assistants and Nurse Practitioners) who all work together to provide you with the care you need, when you need it.  We recommend signing up for the patient portal called "MyChart".  Sign up information is provided on this After Visit Summary.  MyChart is used to connect with patients for Virtual Visits (Telemedicine).  Patients are able to view lab/test results, encounter notes, upcoming appointments, etc.  Non-urgent messages can be sent to your provider as well.   To learn more about what you can do with MyChart, go to [email or web address].    Your next appointment:   6 month(s)  Provider:   Brian Crenshaw, MD      

## 2022-09-15 ENCOUNTER — Encounter: Payer: Self-pay | Admitting: *Deleted

## 2022-09-18 ENCOUNTER — Ambulatory Visit: Payer: Medicare HMO | Attending: Cardiology

## 2022-09-18 DIAGNOSIS — I4891 Unspecified atrial fibrillation: Secondary | ICD-10-CM | POA: Diagnosis not present

## 2022-09-18 DIAGNOSIS — Z5181 Encounter for therapeutic drug level monitoring: Secondary | ICD-10-CM | POA: Diagnosis not present

## 2022-09-18 LAB — POCT INR: INR: 2.4 (ref 2.0–3.0)

## 2022-09-18 NOTE — Patient Instructions (Signed)
Description   Continue taking warfarin 1/2 tablet (2.5mg ) daily.  Remain consistent with leafy veggies (3 per week).  3/21 stopped protein - make Coumadin Clinic aware if you restart.  Recheck INR 2 weeks Coumadin Clinic [phone]

## 2022-09-22 DIAGNOSIS — M1711 Unilateral primary osteoarthritis, right knee: Secondary | ICD-10-CM | POA: Diagnosis not present

## 2022-10-02 ENCOUNTER — Ambulatory Visit: Payer: Medicare HMO | Attending: Cardiology | Admitting: *Deleted

## 2022-10-02 DIAGNOSIS — I4891 Unspecified atrial fibrillation: Secondary | ICD-10-CM | POA: Diagnosis not present

## 2022-10-02 DIAGNOSIS — Z5181 Encounter for therapeutic drug level monitoring: Secondary | ICD-10-CM | POA: Diagnosis not present

## 2022-10-02 LAB — POCT INR: INR: 3.8 — AB (ref 2.0–3.0)

## 2022-10-02 NOTE — Patient Instructions (Signed)
Description   Do not take any warfarin tomorrow (already taken today's dose) then continue taking warfarin 1/2 tablet (2.5mg ) daily. Remain consistent with leafy veggies (3-4 per week).  3/21 stopped protein-make Coumadin Clinic aware if you restart.  Recheck INR 2 weeks. Coumadin Clinic [phone]

## 2022-10-16 ENCOUNTER — Ambulatory Visit: Payer: Medicare HMO | Attending: Cardiology | Admitting: *Deleted

## 2022-10-16 DIAGNOSIS — Z5181 Encounter for therapeutic drug level monitoring: Secondary | ICD-10-CM | POA: Diagnosis not present

## 2022-10-16 DIAGNOSIS — I4891 Unspecified atrial fibrillation: Secondary | ICD-10-CM | POA: Diagnosis not present

## 2022-10-16 LAB — POCT INR: INR: 3.3 — AB (ref 2.0–3.0)

## 2022-10-16 MED ORDER — WARFARIN SODIUM 2.5 MG PO TABS: ORAL_TABLET | ORAL | 0 refills | Status: DC

## 2022-10-16 NOTE — Patient Instructions (Addendum)
Description   Do not take any warfarin tomorrow (already taken today's dose) then START taking warfarin 2.5mg  (1/2 of 5mg ) daily except 1.25mg  (1/2 of 2.5mg ) on Sundays. Remain consistent with leafy veggies (3-4 per week). 2.5mg  tablet is green and 5mg  tablet is peach colored.  3/21 stopped protein-make Coumadin Clinic aware if you restart.  Recheck INR 2 weeks. Coumadin Clinic [phone]

## 2022-10-20 ENCOUNTER — Other Ambulatory Visit: Payer: Self-pay

## 2022-10-20 ENCOUNTER — Encounter: Payer: Self-pay | Admitting: Internal Medicine

## 2022-10-20 ENCOUNTER — Ambulatory Visit (INDEPENDENT_AMBULATORY_CARE_PROVIDER_SITE_OTHER): Payer: Medicare HMO | Admitting: Internal Medicine

## 2022-10-20 VITALS — BP 146/65 | HR 87 | Temp 97.5°F | Ht 66.0 in | Wt 155.8 lb

## 2022-10-20 DIAGNOSIS — H6122 Impacted cerumen, left ear: Secondary | ICD-10-CM | POA: Diagnosis not present

## 2022-10-20 DIAGNOSIS — J301 Allergic rhinitis due to pollen: Secondary | ICD-10-CM

## 2022-10-20 MED ORDER — FLUTICASONE PROPIONATE 50 MCG/ACT NA SUSP: 2.0000 | Freq: Every day | NASAL | 0 refills | Status: DC | PRN

## 2022-10-20 NOTE — Patient Instructions (Signed)
[name], itAcree a pleasure seeing you today! You endorsed feeling well today. Below are some of the things we talked about this visit. We look forward to seeing you in the follow up appointment!  Today we discussed: Your symptoms appear consistent from allergic rhinitis. I would like you to take Claritin daily, use flonase spray and use neti pot for irrigation. I am attaching instructions for the neti pot. Use sterile water for this. We will irrigate your ear today as you have significant ear wax. Follow up if symptoms get worse otherwise follow up with your PCP.   I have ordered the following labs today:  Lab Orders  No laboratory test(s) ordered today      Referrals ordered today:   Referral Orders  No referral(s) requested today     I have ordered the following medication/changed the following medications:   Stop the following medications: There are no discontinued medications.   Start the following medications: No orders of the defined types were placed in this encounter.    Follow-up: 3 months with PCP or earlier as needed.   Please make sure to arrive 15 minutes prior to your next appointment. If you arrive late, you may be asked to reschedule.   We look forward to seeing you next time. Please call our clinic at [phone] if you have any questions or concerns. The best time to call is Monday-Friday from 9am-4pm, but there is someone available 24/7. If after hours or the weekend, call the main hospital number and ask for the Internal Medicine Resident On-Call. If you need medication refills, please notify your pharmacy one week in advance and they will send [place] a request.  Thank you for letting us take part in your care. Wishing you the best!  Thank you, Gwenevere Abbot, MD

## 2022-10-20 NOTE — Progress Notes (Unsigned)
CC: Ear fullness and Throat Irritation  HPI:  [name] is a 77 y.o. with medical history of HTN, HLD, CAD, CHF, paroxysmal atrial fibrillation presenting to [facility] for acute concern of ear and throat pain.   Please see problem-based list for further details, assessments, and plans.  Past Medical History:  Diagnosis Date   Allergic rhinitis 08/08/2013   takes Loratadine daily    Aortic stenosis 09/08/2011   With mild aortic regurgitation, mean gradient 13 mmHg    Cataract    right but immature   CHF (congestive heart failure) 2007   Complication of anesthesia    stopped breathing - during shoulder surgery 2009-2010   Coronary artery disease 10/02/2009   Cardiac cath (October 2007): 20% left main, 60% mid LAD, 60-70% distal LAD, 30-40% first diagonal, 30% circumflex, 40% OM, 30-40% RCA    Degenerative joint disease of shoulder 08/03/2013   Bilateral, s/p right total shoulder arthroplasty 05/29/2011 and left shoulder arthroplasty 06/08/2014   Diastolic dysfunction 04/04/2016   Echo (04/04/2016): Grade I   Diverticulosis 10/07/2013   Seen on colonoscopy in 2007    Erectile dysfunction 05/07/2009   Essential hypertension 08/03/2013   had been on Lisinopril but stopped per MD   First degree AV block 03/14/2016   Gastric ulcer 10/07/2013   Seen on EGD 04/10/2006    Hemorrhoids 10/07/2013   Hyperlipidemia 10/02/2009   Paroxysmal atrial fibrillation 07/14/2006   One episode, provoked by alcohol use disorder, briefly anticoagulated with warfarin, then switched to aspirin alone   Pre-diabetes    Sciatica associated with disorder of lumbar spine 08/03/2013   Anterolisthesis with right L 4-5 nerve root compression.  Treated with epidural injections and Physical Therapy.    Seborrheic keratosis 10/07/2013   Tubular adenoma of colon      Current Outpatient Medications (Cardiovascular):    atorvastatin (LIPITOR) 40 MG tablet, Take 1 tablet (40 mg total) by mouth daily.    carvedilol (COREG) 6.25 MG tablet, Take 1 tablet (6.25 mg total) by mouth 2 (two) times daily with a meal.   losartan (COZAAR) 50 MG tablet, TAKE 1 TABLET EVERY DAY  Current Outpatient Medications (Respiratory):    albuterol (VENTOLIN HFA) 108 (90 Base) MCG/ACT inhaler, Inhale 2 puffs into the lungs every 6 (six) hours as needed for wheezing or shortness of breath.   ePHEDrine HCl (PRIMATENE) 12.5 MG TABS, Take 12.5 mg by mouth daily as needed (wheezing).   loratadine (CLARITIN) 10 MG tablet, Take 10 mg by mouth daily as needed (wheezing).  Current Outpatient Medications (Analgesics):    acetaminophen (TYLENOL) 500 MG tablet, Take 2 tablets (1,000 mg total) by mouth 4 (four) times daily. (Patient taking differently: Take 1,000 mg by mouth every 6 (six) hours as needed for moderate pain.)   allopurinol (ZYLOPRIM) 100 MG tablet, Take 1 tablet (100 mg total) by mouth daily.   oxyCODONE (ROXICODONE) 5 MG immediate release tablet, Take 1 tablet (5 mg total) by mouth every 4 (four) hours as needed for severe pain.  Current Outpatient Medications (Hematological):    warfarin (COUMADIN) 2.5 MG tablet, Take 1/2 tablet (1.25mg ) by mouth daily on Sundays or as directed by Anticoagulation Clinic.   warfarin (COUMADIN) 5 MG tablet, Take 0.5 tablets (2.5 mg total) by mouth daily.  Current Outpatient Medications (Other):    docusate sodium (COLACE) 100 MG capsule, Take 1 capsule (100 mg total) by mouth 2 (two) times daily.   finasteride (PROSCAR) 5 MG tablet, Take 1 tablet (5  mg total) by mouth daily.   methocarbamol (ROBAXIN-750) 750 MG tablet, Take 1 tablet (750 mg total) by mouth every 8 (eight) hours as needed for muscle spasms.   ondansetron (ZOFRAN-ODT) 4 MG disintegrating tablet, Take 1 tablet (4 mg total) by mouth every 8 (eight) hours as needed for nausea or vomiting.   silodosin (RAPAFLO) 8 MG CAPS capsule, Take 1 capsule (8 mg total) by mouth daily with breakfast.  Review of Systems:  Review of  system negative unless stated in the problem list or HPI.    Physical Exam:  Vitals:   10/20/22 1524  BP: (!) 146/65  Pulse: 87  Temp: (!) 97.5 F (36.4 C)  TempSrc: Oral  SpO2: 97%  Weight: 155 lb 12.8 oz (70.7 kg)  Height:  (1.676 m)   Physical Exam General: NAD HENT: NCAT Lungs: CTAB, no wheeze, rhonchi or rales.  Cardiovascular: Normal heart sounds, no r/m/g, 2+ pulses in all extremities. No LE edema Abdomen: No TTP, normal bowel sounds MSK: No asymmetry or muscle atrophy.  Skin: no lesions noted on exposed skin Neuro: Alert and oriented x4. CN grossly intact Psych: Normal mood and normal affect   Assessment & Plan:   No problem-specific Assessment & Plan notes found for this encounter.   See Encounters Tab for problem based charting.  Patient Discussed with Dr. Karrie Meres, MD Eligha Bridegroom. [facility] Internal Medicine Residency, PGY-2   Left ear feels full. Noticed it randomly. Had a cold prior to this. No fevers or chills. All started a couple of weeks ago. Voice muffled in the last couple of days. Hx of allergic rhinitis with exacerbation

## 2022-10-21 NOTE — Assessment & Plan Note (Addendum)
Pt presented with left ear fullness and throat irritation along with voice hoarseness. He states the fullness in his ear has been present for about 2 weeks and the throat irritation started about 2 days ago. No fevers, chills, coughing, sinus pain or other infectious symptoms. No ear or throat pain. No sick contacts. He states he has been working in the yard a lot. He has hx of allergic rhinitis with hx of exacerbations but does not take claritin, flonase or use neti pot. He states this appears similar to those episodes. Advised pt to take claritin and use flonase regularly. I advised him to use neti-pot for now and use as needed after this resolves. On exam, pt's left ear with significant cerumen impaction preventing visualization of the TM. Ear irrigation and TM without sign of infection. Advised pt to return if symptoms get worse or don't improve. Pt understanding.

## 2022-10-30 ENCOUNTER — Ambulatory Visit: Payer: Medicare HMO | Attending: Cardiology | Admitting: *Deleted

## 2022-10-30 DIAGNOSIS — Z5181 Encounter for therapeutic drug level monitoring: Secondary | ICD-10-CM

## 2022-10-30 DIAGNOSIS — I4891 Unspecified atrial fibrillation: Secondary | ICD-10-CM

## 2022-10-30 LAB — POCT INR: POC INR: 3.4

## 2022-10-30 MED ORDER — WARFARIN SODIUM 2.5 MG PO TABS: ORAL_TABLET | ORAL | 0 refills | Status: DC

## 2022-10-30 NOTE — Patient Instructions (Signed)
Description   Stop using warfarin 5mg  tabs and START using the warfarin 2.5mg  tabs only (mint green)  Hold warfarin today and START taking warfarin 1 tablet daily except for 1/2 a tablet on Sunday and Thursday.   3/21 stopped protein-make Coumadin Clinic aware if you restart.  Recheck INR 2 weeks. Coumadin Clinic [phone]

## 2022-11-01 ENCOUNTER — Other Ambulatory Visit: Payer: Self-pay | Admitting: Student in an Organized Health Care Education/Training Program

## 2022-11-01 DIAGNOSIS — E7800 Pure hypercholesterolemia, unspecified: Secondary | ICD-10-CM

## 2022-11-04 NOTE — Progress Notes (Signed)
Internal Medicine Clinic Attending  Case discussed with Dr. Khan  At the time of the visit.  We reviewed the resident's history and exam and pertinent patient test results.  I agree with the assessment, diagnosis, and plan of care documented in the resident's note.  

## 2022-11-14 ENCOUNTER — Telehealth: Payer: Self-pay | Admitting: Student in an Organized Health Care Education/Training Program

## 2022-11-14 NOTE — Telephone Encounter (Signed)
Contacted [name] to schedule their annual wellness visit. Appointment made for 11/26/2022.  [facility] Care Guide [facility] AWV TEAM Direct Dial: [phone]

## 2022-11-17 ENCOUNTER — Ambulatory Visit: Payer: Medicare HMO | Attending: Cardiology | Admitting: *Deleted

## 2022-11-17 DIAGNOSIS — Z5181 Encounter for therapeutic drug level monitoring: Secondary | ICD-10-CM | POA: Diagnosis not present

## 2022-11-17 DIAGNOSIS — I4891 Unspecified atrial fibrillation: Secondary | ICD-10-CM

## 2022-11-17 LAB — POCT INR: POC INR: 2

## 2022-11-17 NOTE — Patient Instructions (Signed)
Description   -Take 1.5 tablets of warfarin today  -Then continue taking the dose you have been taking 1 tablet daily except for 1/2 a tablet on Sundays. Recheck INR 4 weeks.  Coumadin Clinic [phone]

## 2022-12-15 ENCOUNTER — Ambulatory Visit: Payer: Medicare HMO | Attending: Cardiology

## 2022-12-15 DIAGNOSIS — Z5181 Encounter for therapeutic drug level monitoring: Secondary | ICD-10-CM

## 2022-12-15 DIAGNOSIS — I4891 Unspecified atrial fibrillation: Secondary | ICD-10-CM | POA: Diagnosis not present

## 2022-12-15 LAB — POCT INR: INR: 3.9 — AB (ref 2.0–3.0)

## 2022-12-15 NOTE — Patient Instructions (Signed)
Description   HOLD today's dose and then continue taking the dose you have been taking 1 tablet daily except for 1/2 a tablet on Sundays.  Recheck INR 2 weeks.  Coumadin Clinic [phone]

## 2022-12-21 IMAGING — MR MR LUMBAR SPINE W/O CM
4 of 5 series · 18 of 48 positions shown · non-contrast
Comparison: MRI of the lumbar spine May 02, 2006.

CLINICAL DATA: Low back pain, unspecified back pain laterality,
unspecified chronicity, unspecified whether sciatica present.

EXAM:
MRI LUMBAR SPINE WITHOUT CONTRAST
TECHNIQUE: Multiplanar, multisequence MR imaging of the lumbar spine was
performed. No intravenous contrast was administered.

[Series 5: T2 · sagittal · 4.0mm · 0.73mm/px · 6 of 17 slices shown (1 of 2)]
[im 1/17]
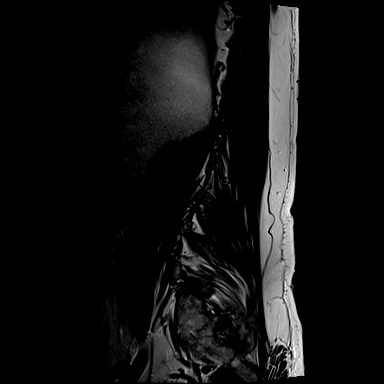
[im 4/17]
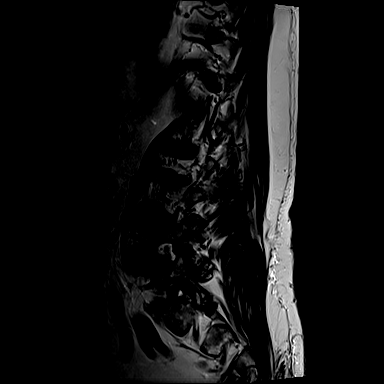
[im 7/17]
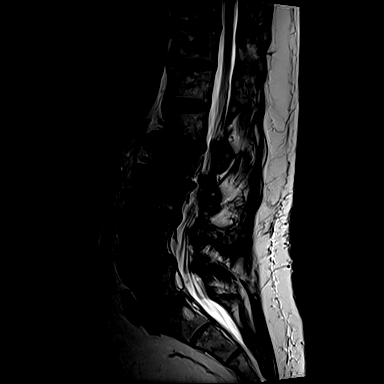
[im 10/17]
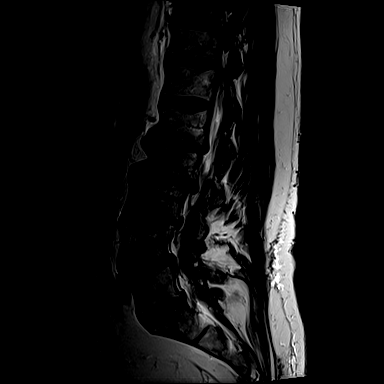
[im 13/17]
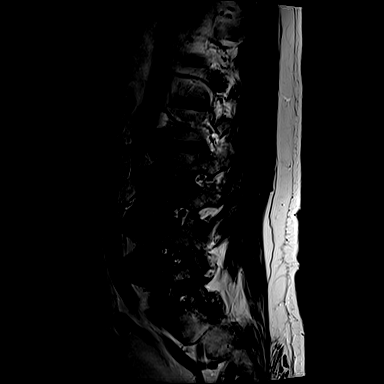
[im 17/17]
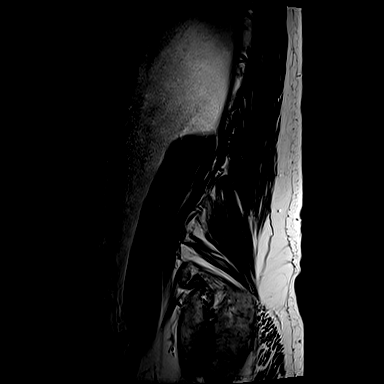

[Series 6: T1 · sagittal · 4.0mm · 0.73mm/px · 3 of 17 slices shown (1 of 2)]
[im 4/17]
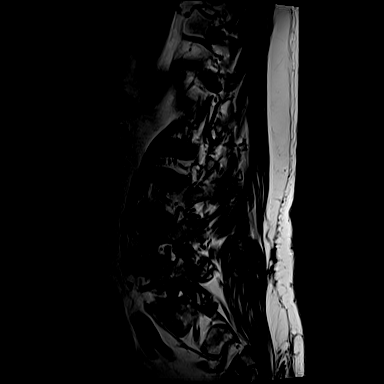
[im 10/17]
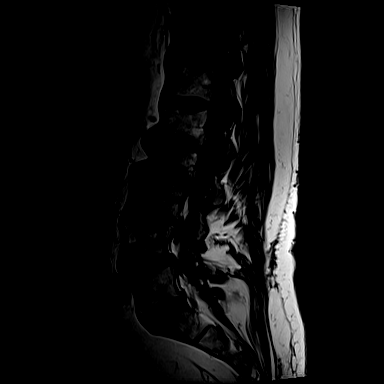
[im 17/17]
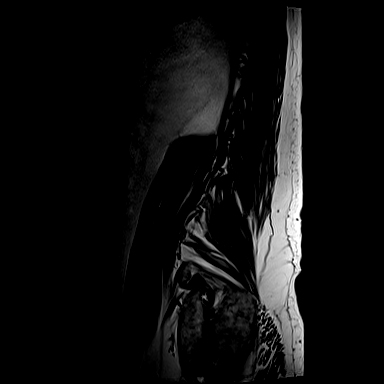

[Series 12: T2 · axial · 4.0mm · 0.28mm/px · z∈[-62,+117]mm · 6 of 41 slices shown (2 of 2)]
[im 1/41]
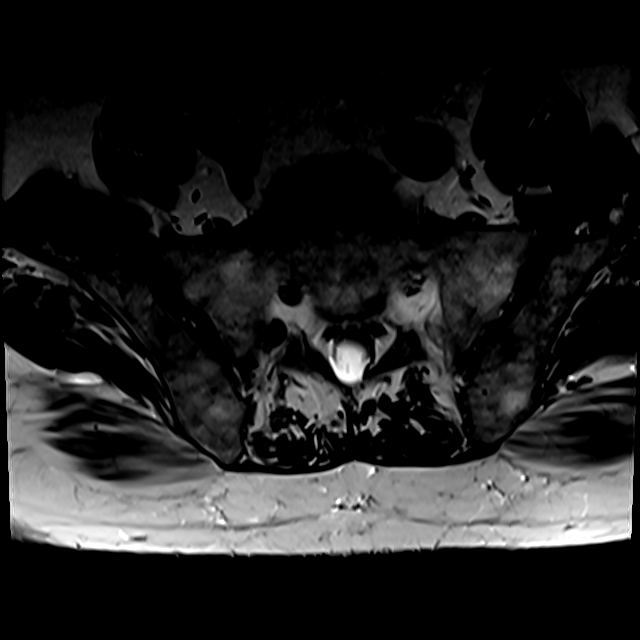
[im 6/41]
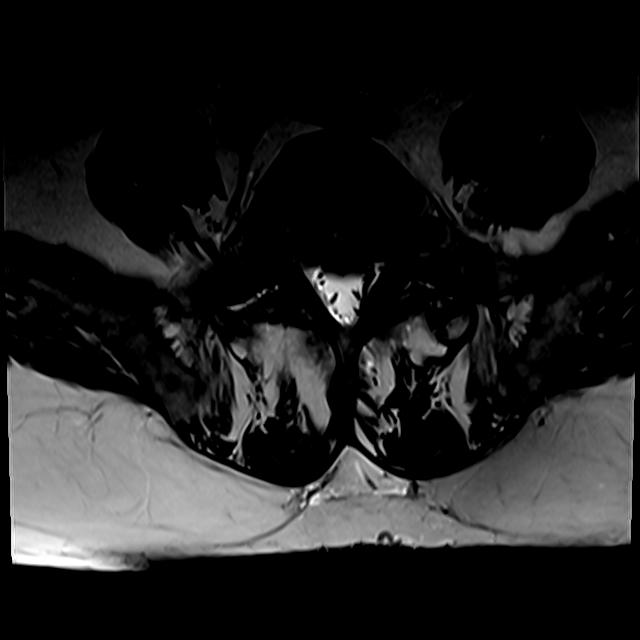
[im 12/41]
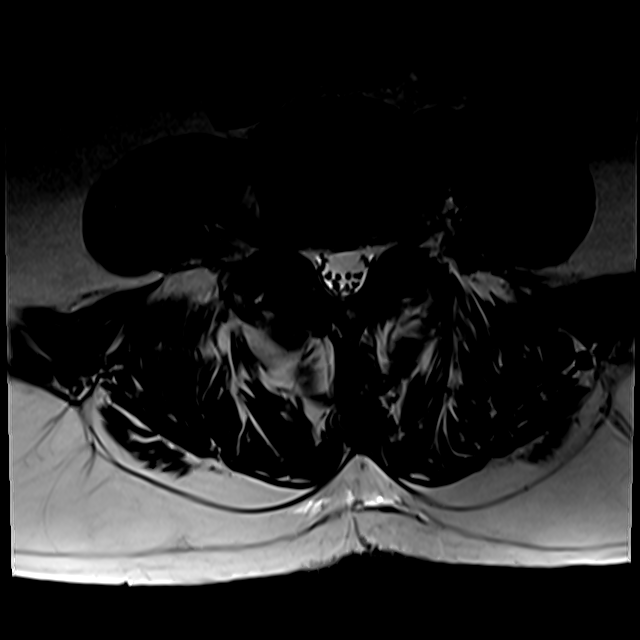
[im 18/41]
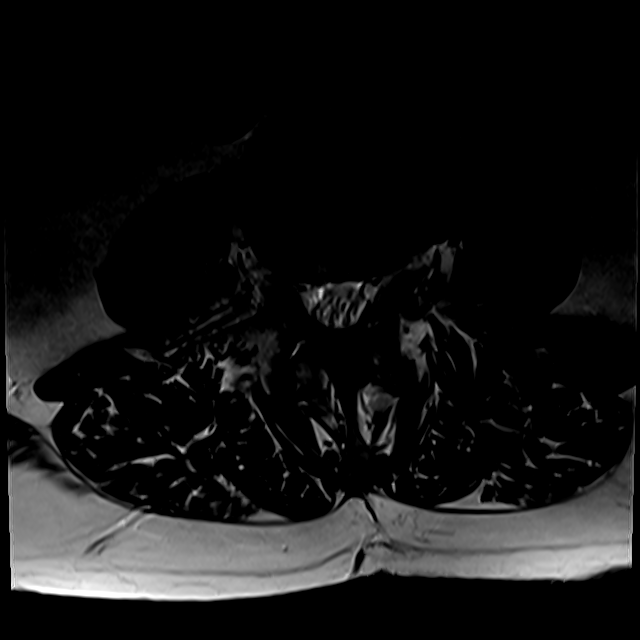
[im 21/41]
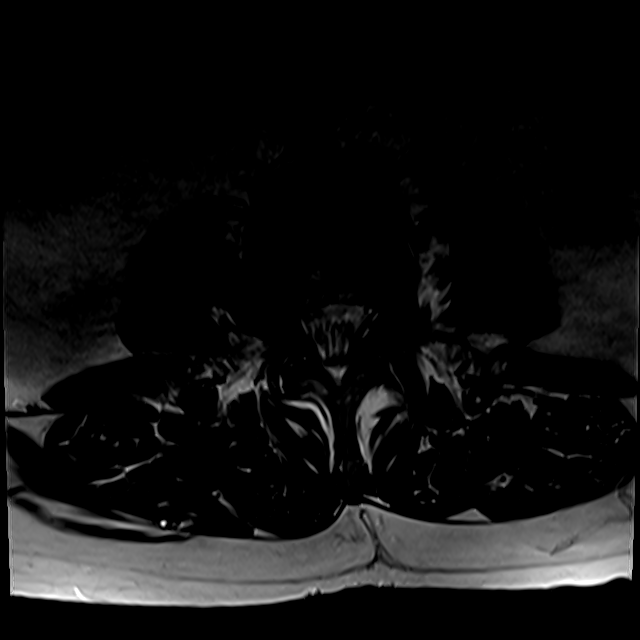
[im 35/41]
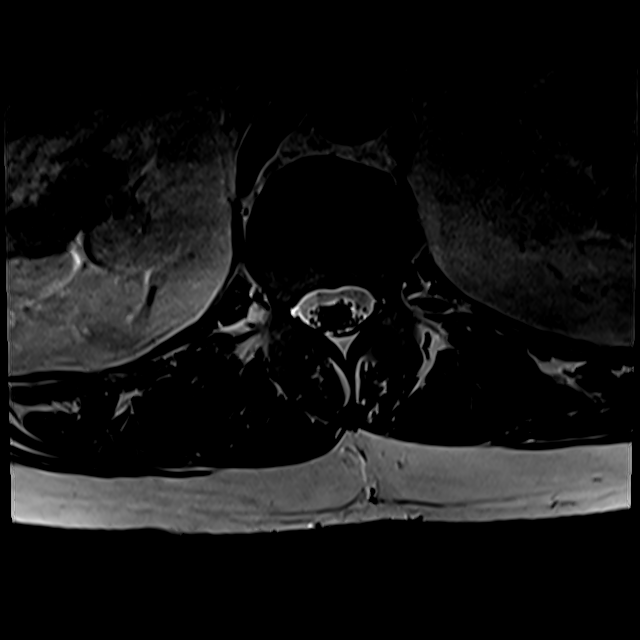

[Series 100: T1 · axial · 4.0mm · 0.28mm/px · z∈[-37,+117]mm · 3 of 41 slices shown (2 of 2)]
[im 6/41]
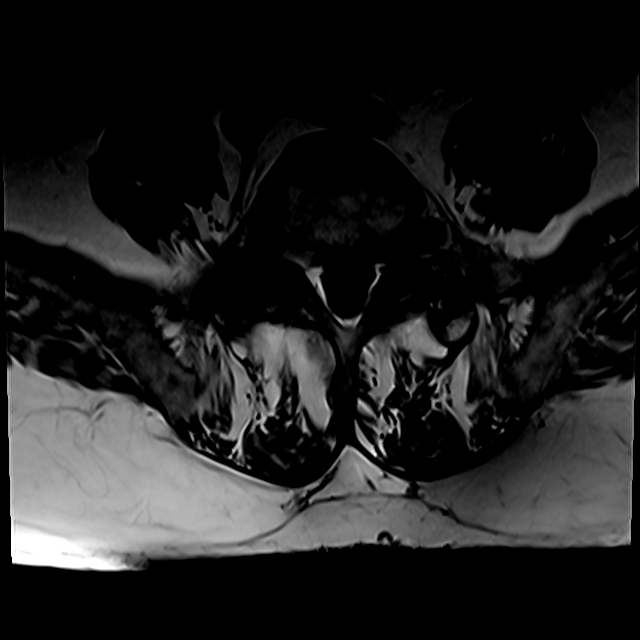
[im 21/41]
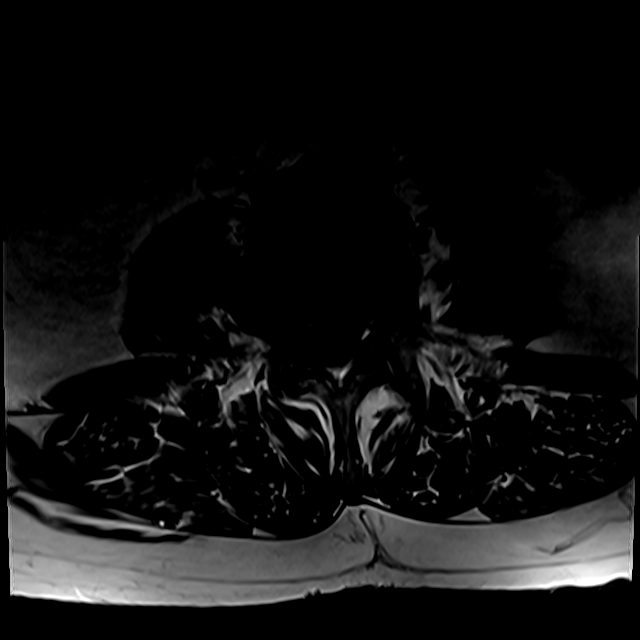
[im 35/41]
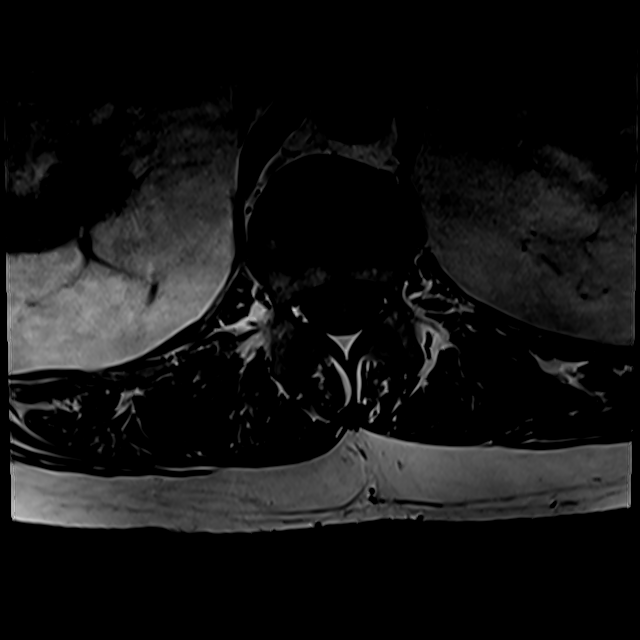

[18 of 48 positions shown; findings below may reference images not displayed]

FINDINGS: Segmentation:  Standard.

Alignment: Dextroconvex scoliosis. Trace retrolisthesis of L2 over
L3 and L3 over L4 with trace anterolisthesis of L4 over L5.

Vertebrae: No fracture, evidence of discitis, or bone lesion.
Endplate degenerative changes at L1-2, L2-3 and L3-4.

Conus medullaris and cauda equina: Conus extends to the L2 level.
Conus and cauda equina appear normal.

Paraspinal and other soft tissues: Right renal cysts.

Disc levels:

T12-L1: No spinal canal or neural foraminal stenosis.

L1-2: Loss of disc height, disc bulge and mild facet degenerative
changes resulting in mild right and moderate left neural foraminal
narrowing. No significant spinal canal stenosis.

L2-3: Loss of disc height disc bulge with associated osteophytic
component and moderate facet degenerative changes resulting in mild
spinal canal stenosis and moderate bilateral neural foraminal
narrowing.

L3-4: Disc bulge and moderate facet degenerative changes resulting
in moderate right and mild left neural foraminal narrowing. No
significant spinal canal stenosis.

L4-5: Disc bulge, prominent hypertrophic facet degenerative changes
and ligamentum flavum redundancy resulting in mild spinal canal
stenosis with narrowing of the bilateral subarticular zones,
moderate right and mild left neural foraminal narrowing.

L5-S1: Shallow disc bulge with superimposed small central disc
protrusion and prominent hypertrophic bilateral facet degenerative
changes the resulting in moderate right and moderate to severe left
neural foraminal narrowing. No significant spinal canal stenosis.
IMPRESSION: 1. Degenerative changes of the lumbar spine with advanced facet
arthropathy, particularly at L4-5 and L5-S1.
2. Multilevel neural foraminal narrowing, as described above.
3. Mild spinal canal stenosis at L2-3 and L4-5.

## 2022-12-24 ENCOUNTER — Ambulatory Visit (INDEPENDENT_AMBULATORY_CARE_PROVIDER_SITE_OTHER): Payer: Medicare HMO

## 2022-12-24 VITALS — Ht 65.0 in | Wt 150.0 lb

## 2022-12-24 DIAGNOSIS — Z0000 Encounter for general adult medical examination without abnormal findings: Secondary | ICD-10-CM | POA: Diagnosis not present

## 2022-12-24 NOTE — Patient Instructions (Signed)
[name] , Thank you for taking time to come for your Medicare Wellness Visit. I appreciate your ongoing commitment to your health goals. Please review the following plan we discussed and let me know if I can assist you in the future.   These are the goals we discussed:  Goals      Blood Pressure < 140/90     LDL CALC < 100     Patient Stated     12/24/2022, wants to get stronger     Weight (lb) < 165 lb (74.8 kg)        This is a list of the screening recommended for you and due dates:  Health Maintenance  Topic Date Due   Medicare Annual Wellness Visit  Never done   Zoster (Shingles) Vaccine (1 of 2) Never done   Colon Cancer Screening  04/11/2011   COVID-19 Vaccine (4 - 2023-24 season) 02/28/2022   Screening for Lung Cancer  12/29/2022   Flu Shot  01/29/2023   DTaP/Tdap/Td vaccine (2 - Td or Tdap) 03/14/2026   Pneumonia Vaccine  Completed   Hepatitis C Screening  Completed   HPV Vaccine  Aged Out    Advanced directives: Advance directive discussed with you today.   Conditions/risks identified: none  Next appointment: Follow up in one year for your annual wellness visit.   Preventive Care 75 Years and Older, Male  Preventive care refers to lifestyle choices and visits with your health care provider that can promote health and wellness. What does preventive care include? A yearly physical exam. This is also called an annual well check. Dental exams once or twice a year. Routine eye exams. Ask your health care provider how often you should have your eyes checked. Personal lifestyle choices, including: Daily care of your teeth and gums. Regular physical activity. Eating a healthy diet. Avoiding tobacco and drug use. Limiting alcohol use. Practicing safe sex. Taking low doses of aspirin every day. Taking vitamin and mineral supplements as recommended by your health care provider. What happens during an annual well check? The services and screenings done by your  health care provider during your annual well check will depend on your age, overall health, lifestyle risk factors, and family history of disease. Counseling  Your health care provider may ask you questions about your: Alcohol use. Tobacco use. Drug use. Emotional well-being. Home and relationship well-being. Sexual activity. Eating habits. History of falls. Memory and ability to understand (cognition). Work and work Astronomer. Screening  You may have the following tests or measurements: Height, weight, and BMI. Blood pressure. Lipid and cholesterol levels. These may be checked every 5 years, or more frequently if you are over 45 years old. Skin check. Lung cancer screening. You may have this screening every year starting at age 51 if you have a 30-pack-year history of smoking and currently smoke or have quit within the past 15 years. Fecal occult blood test (FOBT) of the stool. You may have this test every year starting at age 26. Flexible sigmoidoscopy or colonoscopy. You may have a sigmoidoscopy every 5 years or a colonoscopy every 10 years starting at age 15. Prostate cancer screening. Recommendations will vary depending on your family history and other risks. Hepatitis C blood test. Hepatitis B blood test. Sexually transmitted disease (STD) testing. Diabetes screening. This is done by checking your blood sugar (glucose) after you have not eaten for a while (fasting). You may have this done every 1-3 years. Abdominal aortic aneurysm (AAA) screening.  You may need this if you are a current or former smoker. Osteoporosis. You may be screened starting at age 32 if you are at high risk. Talk with your health care provider about your test results, treatment options, and if necessary, the need for more tests. Vaccines  Your health care provider may recommend certain vaccines, such as: Influenza vaccine. This is recommended every year. Tetanus, diphtheria, and acellular pertussis  (Tdap, Td) vaccine. You may need a Td booster every 10 years. Zoster vaccine. You may need this after age 35. Pneumococcal 13-valent conjugate (PCV13) vaccine. One dose is recommended after age 83. Pneumococcal polysaccharide (PPSV23) vaccine. One dose is recommended after age 47. Talk to your health care provider about which screenings and vaccines you need and how often you need them. This information is not intended to replace advice given to you by your health care provider. Make sure you discuss any questions you have with your health care provider. Document Released: 07/13/2015 Document Revised: 03/05/2016 Document Reviewed: 04/17/2015 Elsevier Interactive Patient Education  2017 [place] Prevention in the Home Falls can cause injuries. They can happen to people of all ages. There are many things you can do to make your home safe and to help prevent falls. What can I do on the outside of my home? Regularly fix the edges of walkways and driveways and fix any cracks. Remove anything that might make you trip as you walk through a door, such as a raised step or threshold. Trim any bushes or trees on the path to your home. Use bright outdoor lighting. Clear any walking paths of anything that might make someone trip, such as rocks or tools. Regularly check to see if handrails are loose or broken. Make sure that both sides of any steps have handrails. Any raised decks and porches should have guardrails on the edges. Have any leaves, snow, or ice cleared regularly. Use sand or salt on walking paths during winter. Clean up any spills in your garage right away. This includes oil or grease spills. What can I do in the bathroom? Use night lights. Install grab bars by the toilet and in the tub and shower. Do not use towel bars as grab bars. Use non-skid mats or decals in the tub or shower. If you need to sit down in the shower, use a plastic, non-slip stool. Keep the floor dry. Clean  up any water that spills on the floor as soon as it happens. Remove soap buildup in the tub or shower regularly. Attach bath mats securely with double-sided non-slip rug tape. Do not have throw rugs and other things on the floor that can make you trip. What can I do in the bedroom? Use night lights. Make sure that you have a light by your bed that is easy to reach. Do not use any sheets or blankets that are too big for your bed. They should not hang down onto the floor. Have a firm chair that has side arms. You can use this for support while you get dressed. Do not have throw rugs and other things on the floor that can make you trip. What can I do in the kitchen? Clean up any spills right away. Avoid walking on wet floors. Keep items that you use a lot in easy-to-reach places. If you need to reach something above you, use a strong step stool that has a grab bar. Keep electrical cords out of the way. Do not use floor polish or wax  that makes floors slippery. If you must use wax, use non-skid floor wax. Do not have throw rugs and other things on the floor that can make you trip. What can I do with my stairs? Do not leave any items on the stairs. Make sure that there are handrails on both sides of the stairs and use them. Fix handrails that are broken or loose. Make sure that handrails are as long as the stairways. Check any carpeting to make sure that it is firmly attached to the stairs. Fix any carpet that is loose or worn. Avoid having throw rugs at the top or bottom of the stairs. If you do have throw rugs, attach them to the floor with carpet tape. Make sure that you have a light switch at the top of the stairs and the bottom of the stairs. If you do not have them, ask someone to add them for you. What else can I do to help prevent falls? Wear shoes that: Do not have high heels. Have rubber bottoms. Are comfortable and fit you well. Are closed at the toe. Do not wear sandals. If you  use a stepladder: Make sure that it is fully opened. Do not climb a closed stepladder. Make sure that both sides of the stepladder are locked into place. Ask someone to hold it for you, if possible. Clearly mark and make sure that you can see: Any grab bars or handrails. First and last steps. Where the edge of each step is. Use tools that help you move around (mobility aids) if they are needed. These include: Canes. Walkers. Scooters. Crutches. Turn on the lights when you go into a dark area. Replace any light bulbs as soon as they burn out. Set up your furniture so you have a clear path. Avoid moving your furniture around. If any of your floors are uneven, fix them. If there are any pets around you, be aware of where they are. Review your medicines with your doctor. Some medicines can make you feel dizzy. This can increase your chance of falling. Ask your doctor what other things that you can do to help prevent falls. This information is not intended to replace advice given to you by your health care provider. Make sure you discuss any questions you have with your health care provider. Document Released: 04/12/2009 Document Revised: 11/22/2015 Document Reviewed: 07/21/2014 Elsevier Interactive Patient Education  2017 [employer].

## 2022-12-24 NOTE — Progress Notes (Signed)
Subjective:   [name] is a 77 y.o. male who presents for Medicare Annual/Subsequent preventive examination.  Visit Complete: Virtual  I connected with  Mike Gip on 12/24/22 by a audio enabled telemedicine application and verified that I am speaking with the correct person using two identifiers.  Patient Location: Home  Provider Location: Office/Clinic  I discussed the limitations of evaluation and management by telemedicine. The patient expressed understanding and agreed to proceed.  .  Review of Systems     Cardiac Risk Factors include: advanced age (>59men, >46 women);dyslipidemia;hypertension     Objective:    Today's Vitals   12/24/22 1529  Weight: 150 lb (68 kg)  Height: 5\' 5"  (1.651 m)   Body mass index is 24.96 kg/m.     12/24/2022    3:40 PM 10/20/2022    3:25 PM 07/29/2022    2:00 PM 07/29/2022    8:39 AM 07/21/2022   11:05 AM 06/02/2022   11:57 AM 05/05/2022    9:07 AM  Advanced Directives  Does Patient Have a Medical Advance Directive? No No No No No No No  Would patient like information on creating a medical advance directive?  No - Patient declined No - Patient declined No - Patient declined  No - Patient declined No - Patient declined    Current Medications (verified) Outpatient Encounter Medications as of 12/24/2022  Medication Sig   acetaminophen (TYLENOL) 500 MG tablet Take 2 tablets (1,000 mg total) by mouth 4 (four) times daily. (Patient taking differently: Take 1,000 mg by mouth every 6 (six) hours as needed for moderate pain.)   albuterol (VENTOLIN HFA) 108 (90 Base) MCG/ACT inhaler Inhale 2 puffs into the lungs every 6 (six) hours as needed for wheezing or shortness of breath.   atorvastatin (LIPITOR) 40 MG tablet TAKE 1 TABLET EVERY DAY   carvedilol (COREG) 6.25 MG tablet Take 1 tablet (6.25 mg total) by mouth 2 (two) times daily with a meal.   docusate sodium (COLACE) 100 MG capsule Take 1 capsule (100 mg total) by mouth 2 (two)  times daily.   ePHEDrine HCl (PRIMATENE) 12.5 MG TABS Take 12.5 mg by mouth daily as needed (wheezing).   finasteride (PROSCAR) 5 MG tablet Take 1 tablet (5 mg total) by mouth daily.   fluticasone (FLONASE) 50 MCG/ACT nasal spray Place 2 sprays into both nostrils daily as needed for allergies or rhinitis.   loratadine (CLARITIN) 10 MG tablet Take 10 mg by mouth daily as needed (wheezing).   losartan (COZAAR) 50 MG tablet TAKE 1 TABLET EVERY DAY   silodosin (RAPAFLO) 8 MG CAPS capsule Take 1 capsule (8 mg total) by mouth daily with breakfast.   warfarin (COUMADIN) 2.5 MG tablet Take 1/2 a tablet to 1 tablet by mouth daily as directed by the coumadin clinic   allopurinol (ZYLOPRIM) 100 MG tablet Take 1 tablet (100 mg total) by mouth daily. (Patient not taking: Reported on 12/24/2022)   methocarbamol (ROBAXIN-750) 750 MG tablet Take 1 tablet (750 mg total) by mouth every 8 (eight) hours as needed for muscle spasms. (Patient not taking: Reported on 12/24/2022)   ondansetron (ZOFRAN-ODT) 4 MG disintegrating tablet Take 1 tablet (4 mg total) by mouth every 8 (eight) hours as needed for nausea or vomiting. (Patient not taking: Reported on 12/24/2022)   oxyCODONE (ROXICODONE) 5 MG immediate release tablet Take 1 tablet (5 mg total) by mouth every 4 (four) hours as needed for severe pain. (Patient not taking: Reported on  12/24/2022)   No facility-administered encounter medications on file as of 12/24/2022.    Allergies (verified) Patient has no known allergies.   History: Past Medical History:  Diagnosis Date   Allergic rhinitis 08/08/2013   takes Loratadine daily    Aortic stenosis 09/08/2011   With mild aortic regurgitation, mean gradient 13 mmHg    Cataract    right but immature   CHF (congestive heart failure) (HCC) 2007   Complication of anesthesia    stopped breathing - during shoulder surgery 2009-2010   Coronary artery disease 10/02/2009   Cardiac cath (October 2007): 20% left main, 60% mid  LAD, 60-70% distal LAD, 30-40% first diagonal, 30% circumflex, 40% OM, 30-40% RCA    Degenerative joint disease of shoulder 08/03/2013   Bilateral, s/p right total shoulder arthroplasty 05/29/2011 and left shoulder arthroplasty 06/08/2014   Diastolic dysfunction 04/04/2016   Echo (04/04/2016): Grade I   Diverticulosis 10/07/2013   Seen on colonoscopy in 2007    Erectile dysfunction 05/07/2009   Essential hypertension 08/03/2013   had been on Lisinopril but stopped per MD   First degree AV block 03/14/2016   Gastric ulcer 10/07/2013   Seen on EGD 04/10/2006    Hemorrhoids 10/07/2013   Hyperlipidemia 10/02/2009   Paroxysmal atrial fibrillation (HCC) 07/14/2006   One episode, provoked by alcohol use disorder, briefly anticoagulated with warfarin, then switched to aspirin alone   Pre-diabetes    Sciatica associated with disorder of lumbar spine 08/03/2013   Anterolisthesis with right L 4-5 nerve root compression.  Treated with epidural injections and Physical Therapy.    Seborrheic keratosis 10/07/2013   Tubular adenoma of colon    Past Surgical History:  Procedure Laterality Date   BIOPSY  06/02/2022   Procedure: BIOPSY;  Surgeon: Meridee Score Netty Starring., MD;  Location: [facility];  Service: Gastroenterology;;   CARDIAC CATHETERIZATION  2007   CARDIOVERSION     CARDIOVERSION N/A 08/04/2019   Procedure: CARDIOVERSION;  Surgeon: Pricilla Riffle, MD;  Location: [facility] ENDOSCOPY;  Service: Cardiovascular;  Laterality: N/A;   CHOLECYSTECTOMY N/A 04/21/2022   Procedure: LAPAROSCOPIC CHOLECYSTECTOMY;  Surgeon: Diamantina Monks, MD;  Location: [facility] OR;  Service: General;  Laterality: N/A;   COLONOSCOPY     CYSTOSCOPY N/A 01/04/2022   Procedure: Bluford Kaufmann;  Surgeon: Heloise Purpura, MD;  Location: [facility] OR;  Service: Urology;  Laterality: N/A;   ENDOSCOPIC RETROGRADE CHOLANGIOPANCREATOGRAPHY (ERCP) WITH PROPOFOL N/A 06/02/2022   Procedure: ENDOSCOPIC RETROGRADE CHOLANGIOPANCREATOGRAPHY (ERCP) WITH  PROPOFOL;  Surgeon: Meridee Score Netty Starring., MD;  Location: [facility] ENDOSCOPY;  Service: Gastroenterology;  Laterality: N/A;   ERCP N/A 04/21/2022   Procedure: ATTEMPTED ENDOSCOPIC RETROGRADE CHOLANGIOPANCREATOGRAPHY (ERCP);  Surgeon: Iva Boop, MD;  Location: [facility] OR;  Service: Gastroenterology;  Laterality: N/A;   ESOPHAGOGASTRODUODENOSCOPY (EGD) WITH PROPOFOL N/A 06/02/2022   Procedure: ESOPHAGOGASTRODUODENOSCOPY (EGD) WITH PROPOFOL;  Surgeon: Meridee Score Netty Starring., MD;  Location: [facility] ENDOSCOPY;  Service: Gastroenterology;  Laterality: N/A;   ESOPHAGOGASTRODUODENOSCOPY (EGD) WITH PROPOFOL N/A 07/22/2022   Procedure: ESOPHAGOGASTRODUODENOSCOPY (EGD) WITH PROPOFOL;  Surgeon: Iva Boop, MD;  Location: [facility] ENDOSCOPY;  Service: Gastroenterology;  Laterality: N/A;   EYE SURGERY Bilateral 2022   bilateral cataracts   HEMOSTASIS CLIP PLACEMENT  06/02/2022   Procedure: HEMOSTASIS CLIP PLACEMENT;  Surgeon: Lemar Lofty., MD;  Location: [facility] ENDOSCOPY;  Service: Gastroenterology;;   INSERTION OF MESH N/A 03/11/2019   Procedure: Insertion Of Mesh;  Surgeon: Axel Filler, MD;  Location[facility] OR;  Service: General;  Laterality: N/A;   INTRAOPERATIVE  CHOLANGIOGRAM N/A 04/21/2022   Procedure: INTRAOPERATIVE CHOLANGIOGRAM;  Surgeon: Diamantina Monks, MD;  Location: [facility] OR;  Service: General;  Laterality: N/A;   IR EXCHANGE BILIARY DRAIN  02/14/2022   IR PERC CHOLECYSTOSTOMY  12/29/2021   JOINT REPLACEMENT     KNEE ARTHROSCOPY WITH MENISCAL REPAIR Right 01/30/2011   LAPAROTOMY N/A 01/04/2022   Procedure: EXPLORATORY LAPAROTOMY WITH EXTRAPERITONEAL BLADDER REPAIR;  Surgeon: Heloise Purpura, MD;  Location: [facility] OR;  Service: Urology;  Laterality: N/A;   PANCREATIC STENT PLACEMENT  06/02/2022   Procedure: PANCREATIC STENT PLACEMENT;  Surgeon: Meridee Score Netty Starring., MD;  Location: [facility]OSCOPY;  Service: Gastroenterology;;   PANCREATIC STENT PLACEMENT  07/22/2022   Procedure: PANCREATIC STENT REMOVAL;   Surgeon: Iva Boop, MD;  Location: [facility] ENDOSCOPY;  Service: Gastroenterology;;   POLYPECTOMY  06/02/2022   Procedure: POLYPECTOMY;  Surgeon: Lemar Lofty., MD;  Location: [facility] ENDOSCOPY;  Service: Gastroenterology;;   right finger surgery     as a child   SPHINCTEROTOMY  06/02/2022   Procedure: Dennison Mascot;  Surgeon: Lemar Lofty., MD;  Location: [facility];  Service: Gastroenterology;;   TONSILLECTOMY     TOTAL KNEE ARTHROPLASTY Left 07/28/2017   Procedure: LEFT TOTAL KNEE ARTHROPLASTY;  Surgeon: Sheral Apley, MD;  Location: [facility] OR;  Service: Orthopedics;  Laterality: Left;   TOTAL KNEE ARTHROPLASTY Right 07/29/2022   Procedure: TOTAL KNEE ARTHROPLASTY;  Surgeon: Sheral Apley, MD;  Location: [facility] ORS;  Service: Orthopedics;  Laterality: Right;   TOTAL SHOULDER ARTHROPLASTY  05/29/2011   Procedure: TOTAL SHOULDER ARTHROPLASTY;  Surgeon: Vania Rea Supple;  Location: [facility] OR;  Service: Orthopedics;  Laterality: Right;   TOTAL SHOULDER ARTHROPLASTY Left 06/08/2014   DR SUPPLE   TOTAL SHOULDER ARTHROPLASTY Left 06/08/2014   Procedure: LEFT TOTAL SHOULDER ARTHROPLASTY;  Surgeon: Senaida Lange, MD;  Location: [facility] OR;  Service: Orthopedics;  Laterality: Left;   UMBILICAL HERNIA REPAIR N/A 03/11/2019   Procedure: LAPAROSCOPIC UMBILICAL HERNIA;  Surgeon: Axel Filler, MD;  Location: [facility] OR;  Service: General;  Laterality: N/A;   Family History  Problem Relation Age of Onset   Bradycardia Mother 29       Requiring pacemeaker placement   Coronary artery disease Father 45       Died of myocardial infarction   Coronary artery disease Brother 75       Died of myocardial infarction   Obesity Daughter    Healthy Son    Colon cancer Neg Hx    Esophageal cancer Neg Hx    Stomach cancer Neg Hx    Colon polyps Neg Hx    Social History   Socioeconomic History   Marital status: Married    Spouse name: Marjolin- Margie   Number of children: 2   Years of education:  Not on file   Highest education level: Not on file  Occupational History   Occupation: Retired  Tobacco Use   Smoking status: Former    Packs/day: 1.50    Years: 42.00    Additional pack years: 0.00    Total pack years: 63.00    Types: Cigarettes    Start date: 42    Quit date: 07/14/2010    Years since quitting: 12.4   Smokeless tobacco: Never   Tobacco comments:    quit smoking about 73yrs ago  Vaping Use   Vaping Use: Never used  Substance and Sexual Activity   Alcohol use: Not Currently    Comment: no alcohol since 07  Drug use: No   Sexual activity: Never    Birth control/protection: None  Other Topics Concern   Not on file  Social History Narrative   Lives [place] with wife and dog.  Former Contractor.   Social Determinants of Health   Financial Resource Strain: Low Risk  (12/24/2022)   Overall Financial Resource Strain (CARDIA)    Difficulty of Paying Living Expenses: Not hard at all  Food Insecurity: No Food Insecurity (12/24/2022)   Hunger Vital Sign    Worried About Running Out of Food in the Last Year: Never true    Ran Out of Food in the Last Year: Never true  Transportation Needs: No Transportation Needs (12/24/2022)   PRAPARE - Administrator, Civil Service (Medical): No    Lack of Transportation (Non-Medical): No  Physical Activity: Inactive (12/24/2022)   Exercise Vital Sign    Days of Exercise per Week: 0 days    Minutes of Exercise per Session: 0 min  Stress: No Stress Concern Present (12/24/2022)   [employer] of Occupational Health - Occupational Stress Questionnaire    Feeling of Stress : Not at all  Social Connections: Moderately Isolated (12/24/2022)   Social Connection and Isolation Panel [NHANES]    Frequency of Communication with Friends and Family: Three times a week    Frequency of Social Gatherings with Friends and Family: Never    Attends Religious Services: Never    Automotive engineer or Organizations: No    Attends Engineer, structural: Never    Marital Status: Married    Tobacco Counseling Counseling given: Not Answered Tobacco comments: quit smoking about 55yrs ago   Clinical Intake:  Pre-visit preparation completed: Yes  Pain : No/denies pain     Nutritional Status: BMI of 19-24  Normal Nutritional Risks: None Diabetes: No  How often do you need to have someone help you when you read instructions, pamphlets, or other written materials from your doctor or pharmacy?: 1 - Never  Interpreter Needed?: No  Information entered by :: NAllen LPN   Activities of Daily Living    12/24/2022    3:31 PM 10/20/2022    3:26 PM  In your present state of health, do you have any difficulty performing the following activities:  Hearing? 0 0  Vision? 0 0  Difficulty concentrating or making decisions? 0 0  Walking or climbing stairs? 1 1  Comment gets winded SOB  Dressing or bathing? 0 0  Doing errands, shopping? 0 0  Preparing Food and eating ? N   Using the Toilet? N   In the past six months, have you accidently leaked urine? N   Do you have problems with loss of bowel control? N   Managing your Medications? N   Managing your Finances? N   Housekeeping or managing your Housekeeping? N     Patient Care Team: Tyson Alias, MD as PCP - General (Internal Medicine) [name] Madolyn Frieze, MD as PCP - Cardiology (Cardiology) Heloise Purpura, MD as Consulting Physician (Urology)  Indicate any recent Medical Services you may have received from other than [facility] providers in the past year (date may be approximate).     Assessment:   This is a routine wellness examination for Clayton.  Hearing/Vision screen Hearing Screening - Comments:: Denies hearing trouble Vision Screening - Comments:: No regular eye exams,  Dietary issues and exercise activities discussed:     Goals Addressed  This Visit's Progress     Patient Stated       12/24/2022, wants to get stronger       Depression Screen    12/24/2022    3:41 PM 10/20/2022    3:25 PM 05/05/2022    9:12 AM 07/29/2021    9:32 AM 03/12/2020    9:36 AM 10/20/2019   11:07 AM 10/03/2019    3:16 PM  PHQ 2/9 Scores  PHQ - 2 Score 0 0 0 0 0 0 0  PHQ- 9 Score 1 0  0       Fall Risk    12/24/2022    3:41 PM 10/20/2022    3:25 PM 05/05/2022    9:11 AM 07/29/2021    8:59 AM 03/12/2020    8:38 AM  Fall Risk   Falls in the past year? 0 0 0 0 0  Number falls in past yr: 0  0 0 0  Injury with Fall? 0  0 0 0  Risk for fall due to : Medication side effect No Fall Risks Impaired balance/gait    Follow up Falls prevention discussed;Falls evaluation completed Falls evaluation completed Falls evaluation completed Falls evaluation completed     MEDICARE RISK AT HOME:  Medicare Risk at Home - 12/24/22 1541     Any stairs in or around the home? Yes    If so, are there any without handrails? No    Home free of loose throw rugs in walkways, pet beds, electrical cords, etc? Yes    Adequate lighting in your home to reduce risk of falls? Yes    Life alert? No    Use of a cane, walker or w/c? No    Grab bars in the bathroom? No    Shower chair or bench in shower? Yes    Elevated toilet seat or a handicapped toilet? No             TIMED UP AND GO:  Was the test performed?  No    Cognitive Function:        12/24/2022    3:42 PM  6CIT Screen  What Year? 0 points  What month? 0 points  What time? 0 points  Count back from 20 0 points  Months in reverse 0 points  Repeat phrase 0 points  Total Score 0 points    Immunizations Immunization History  Administered Date(s) Administered   Fluad Quad(high Dose 65+) 04/08/2021, 05/05/2022   Influenza,inj,Quad PF,6+ Mos 03/23/2014, 03/21/2015, 03/14/2016, 05/08/2017, 03/21/2019, 03/12/2020   PFIZER(Purple Top)SARS-COV-2 Vaccination 08/26/2019, 09/20/2019, 04/28/2020   Pneumococcal Conjugate-13 07/28/2014    Pneumococcal Polysaccharide-23 05/08/2017   Tdap 03/14/2016    TDAP status: Up to date  Flu Vaccine status: Up to date  Pneumococcal vaccine status: Up to date  Covid-19 vaccine status: Information provided on how to obtain vaccines.   Qualifies for Shingles Vaccine? Yes   Zostavax completed No   Shingrix Completed?: Yes  Screening Tests Health Maintenance  Topic Date Due   Zoster Vaccines- Shingrix (1 of 2) Never done   Colonoscopy  04/11/2011   COVID-19 Vaccine (4 - 2023-24 season) 02/28/2022   Lung Cancer Screening  12/29/2022   INFLUENZA VACCINE  01/29/2023   Medicare Annual Wellness (AWV)  12/24/2023   DTaP/Tdap/Td (2 - Td or Tdap) 03/14/2026   Pneumonia Vaccine 25+ Years old  Completed   Hepatitis C Screening  Completed   HPV VACCINES  Aged Out    Health Maintenance  Health  Maintenance Due  Topic Date Due   Zoster Vaccines- Shingrix (1 of 2) Never done   Colonoscopy  04/11/2011   COVID-19 Vaccine (4 - 2023-24 season) 02/28/2022   Lung Cancer Screening  12/29/2022    Colorectal cancer screening: No longer required.   Lung Cancer Screening: (Low Dose CT Chest recommended if Age 74-80 years, 20 pack-year currently smoking OR have quit w/in 15years.) does qualify.   Lung Cancer Screening Referral: CT scan 12/28/2021  Additional Screening:  Hepatitis C Screening: does qualify; Completed 05/18/2015  Vision Screening: Recommended annual ophthalmology exams for early detection of glaucoma and other disorders of the eye. Is the patient up to date with their annual eye exam?  No  Who is the provider or what is the name of the office in which the patient attends annual eye exams? none If pt is not established with a provider, would they like to be referred to a provider to establish care? No .   Dental Screening: Recommended annual dental exams for proper oral hygiene  Diabetic Foot Exam: n/a  Community Resource Referral / Chronic Care Management: CRR required  this visit?  No   CCM required this visit?  No     Plan:     I have personally reviewed and noted the following in the patient's chart:   Medical and social history Use of alcohol, tobacco or illicit drugs  Current medications and supplements including opioid prescriptions. Patient is not currently taking opioid prescriptions. Functional ability and status Nutritional status Physical activity Advanced directives List of other physicians Hospitalizations, surgeries, and ER visits in previous 12 months Vitals Screenings to include cognitive, depression, and falls Referrals and appointments  In addition, I have reviewed and discussed with patient certain preventive protocols, quality metrics, and best practice recommendations. A written personalized care plan for preventive services as well as general preventive health recommendations were provided to patient.     Barb Merino, LPN   02/26/5620   After Visit Summary: (Pick Up) Due to this being a telephonic visit, with patients personalized plan was offered to patient and patient has requested to Pick up at office.  Nurse Notes: none

## 2022-12-29 ENCOUNTER — Ambulatory Visit: Payer: Medicare HMO | Attending: Cardiology

## 2022-12-29 DIAGNOSIS — Z5181 Encounter for therapeutic drug level monitoring: Secondary | ICD-10-CM | POA: Diagnosis not present

## 2022-12-29 DIAGNOSIS — I4891 Unspecified atrial fibrillation: Secondary | ICD-10-CM

## 2022-12-29 LAB — POCT INR: INR: 2.5 (ref 2.0–3.0)

## 2022-12-29 NOTE — Patient Instructions (Addendum)
Description   Continue taking the dose you have been taking 1 tablet daily except for 1/2 a tablet on Sundays.  Recheck INR 4 weeks.  Coumadin Clinic [phone]

## 2023-01-03 NOTE — Progress Notes (Signed)
I reviewed the AWV findings with the provider who conducted the visit. I was present in the office suite and immediately available to provide assistance and direction throughout the time the service was provided.  

## 2023-01-12 ENCOUNTER — Ambulatory Visit (HOSPITAL_COMMUNITY): Payer: Medicare HMO | Attending: Cardiology

## 2023-01-12 DIAGNOSIS — I350 Nonrheumatic aortic (valve) stenosis: Secondary | ICD-10-CM | POA: Insufficient documentation

## 2023-01-12 LAB — ECHOCARDIOGRAM COMPLETE
AR max vel: 0.67 cm2
AV Area VTI: 0.64 cm2
AV Area mean vel: 0.58 cm2
AV Mean grad: 20 mmHg
AV Peak grad: 26.1 mmHg
Ao pk vel: 2.55 m/s
Est EF: 35
MV M vel: 5.04 m/s
MV Peak grad: 101.4 mmHg
P 1/2 time: 451 msec
S' Lateral: 3.4 cm

## 2023-01-15 NOTE — Progress Notes (Unsigned)
HPI: FU NICM and AS. He also has a history of coronary artery disease with cardiomyopathy out of proportion, alcohol use, now resolved and paroxysmal atrial fibrillation. Cardiac catheterization in October of 2007 showed a 20% left main, a 60% mid LAD and a 60-70% distal LAD. There was a 30-40% first diagonal. There was a 30% circumflex and a 40% OM. There was a 30-40% right coronary artery. Ejection fraction was 25-30%. Abdominal ultrasound in February 2012 showed no aneurysm. ABIs October 2017 normal. Patient found to have recurrent atrial fibrillation September 2020 that was asymptomatic. Pt underwent successful cardioversion February 2021. Echo July 2023 showed normal LV function, moderate left atrial enlargement, mild mitral regurgitation, mild aortic insufficiency and moderate aortic stenosis (mean gradient 17.3 mmHg with valve area 1.1 cm).  Echocardiogram repeated July 2024 and showed newly reduced LV function with ejection fraction 35%, mild left ventricular hypertrophy, mild RV dysfunction, severe left atrial enlargement, moderate right atrial enlargement, mild to moderate mitral regurgitation, moderate tricuspid regurgitation, calcified aortic valve with mean gradient 20 mmHg, aortic valve area 0.64 cm suggestive of severe low-flow/low gradient aortic stenosis and mild aortic insufficiency; small pericardial effusion.  Since he was last seen, patient has developed dyspnea on exertion which is new.  He also notes mild bilateral lower extremity edema.  He denies chest pain, palpitations or syncope.  Current Outpatient Medications  Medication Sig Dispense Refill   acetaminophen (TYLENOL) 500 MG tablet Take 2 tablets (1,000 mg total) by mouth 4 (four) times daily. (Patient taking differently: Take 1,000 mg by mouth every 6 (six) hours as needed for moderate pain.) 120 tablet 3   albuterol (VENTOLIN HFA) 108 (90 Base) MCG/ACT inhaler Inhale 2 puffs into the lungs every 6 (six) hours as needed  for wheezing or shortness of breath. 8 g 2   atorvastatin (LIPITOR) 40 MG tablet TAKE 1 TABLET EVERY DAY 90 tablet 3   carvedilol (COREG) 6.25 MG tablet Take 1 tablet (6.25 mg total) by mouth 2 (two) times daily with a meal. 30 tablet 5   docusate sodium (COLACE) 100 MG capsule Take 1 capsule (100 mg total) by mouth 2 (two) times daily. 60 capsule 1   finasteride (PROSCAR) 5 MG tablet Take 1 tablet (5 mg total) by mouth daily. 30 tablet 5   fluticasone (FLONASE) 50 MCG/ACT nasal spray Place 2 sprays into both nostrils daily as needed for allergies or rhinitis. 16 g 0   loratadine (CLARITIN) 10 MG tablet Take 10 mg by mouth daily as needed (wheezing).     losartan (COZAAR) 50 MG tablet TAKE 1 TABLET EVERY DAY 90 tablet 3   warfarin (COUMADIN) 2.5 MG tablet Take 1/2 a tablet to 1 tablet by mouth daily as directed by the coumadin clinic 90 tablet 0   allopurinol (ZYLOPRIM) 100 MG tablet Take 1 tablet (100 mg total) by mouth daily. (Patient not taking: Reported on 01/22/2023) 90 tablet 3   ePHEDrine HCl (PRIMATENE) 12.5 MG TABS Take 12.5 mg by mouth daily as needed (wheezing). (Patient not taking: Reported on 01/22/2023)     methocarbamol (ROBAXIN-750) 750 MG tablet Take 1 tablet (750 mg total) by mouth every 8 (eight) hours as needed for muscle spasms. (Patient not taking: Reported on 01/22/2023) 20 tablet 0   ondansetron (ZOFRAN-ODT) 4 MG disintegrating tablet Take 1 tablet (4 mg total) by mouth every 8 (eight) hours as needed for nausea or vomiting. (Patient not taking: Reported on 01/22/2023) 15 tablet 0   oxyCODONE (ROXICODONE)  5 MG immediate release tablet Take 1 tablet (5 mg total) by mouth every 4 (four) hours as needed for severe pain. (Patient not taking: Reported on 01/22/2023) 30 tablet 0   silodosin (RAPAFLO) 8 MG CAPS capsule Take 1 capsule (8 mg total) by mouth daily with breakfast. (Patient not taking: Reported on 01/22/2023) 30 capsule 3   No current facility-administered medications for this  visit.     Past Medical History:  Diagnosis Date   Allergic rhinitis 08/08/2013   takes Loratadine daily    Aortic stenosis 09/08/2011   With mild aortic regurgitation, mean gradient 13 mmHg    Cataract    right but immature   CHF (congestive heart failure) (HCC) 2007   Complication of anesthesia    stopped breathing - during shoulder surgery 2009-2010   Coronary artery disease 10/02/2009   Cardiac cath (October 2007): 20% left main, 60% mid LAD, 60-70% distal LAD, 30-40% first diagonal, 30% circumflex, 40% OM, 30-40% RCA    Degenerative joint disease of shoulder 08/03/2013   Bilateral, s/p right total shoulder arthroplasty 05/29/2011 and left shoulder arthroplasty 06/08/2014   Diastolic dysfunction 04/04/2016   Echo (04/04/2016): Grade I   Diverticulosis 10/07/2013   Seen on colonoscopy in 2007    Erectile dysfunction 05/07/2009   Essential hypertension 08/03/2013   had been on Lisinopril but stopped per MD   First degree AV block 03/14/2016   Gastric ulcer 10/07/2013   Seen on EGD 04/10/2006    Hemorrhoids 10/07/2013   Hyperlipidemia 10/02/2009   Paroxysmal atrial fibrillation (HCC) 07/14/2006   One episode, provoked by alcohol use disorder, briefly anticoagulated with warfarin, then switched to aspirin alone   Pre-diabetes    Sciatica associated with disorder of lumbar spine 08/03/2013   Anterolisthesis with right L 4-5 nerve root compression.  Treated with epidural injections and Physical Therapy.    Seborrheic keratosis 10/07/2013   Tubular adenoma of colon     Past Surgical History:  Procedure Laterality Date   BIOPSY  06/02/2022   Procedure: BIOPSY;  Surgeon: Meridee Score Netty Starring., MD;  Location: [facility];  Service: Gastroenterology;;   CARDIAC CATHETERIZATION  2007   CARDIOVERSION     CARDIOVERSION N/A 08/04/2019   Procedure: CARDIOVERSION;  Surgeon: Pricilla Riffle, MD;  Location: [facility]COPY;  Service: Cardiovascular;  Laterality: N/A;   CHOLECYSTECTOMY  N/A 04/21/2022   Procedure: LAPAROSCOPIC CHOLECYSTECTOMY;  Surgeon: Diamantina Monks, MD;  Location: [facility] OR;  Service: General;  Laterality: N/A;   COLONOSCOPY     CYSTOSCOPY N/A 01/04/2022   Procedure: Bluford Kaufmann;  Surgeon: Heloise Purpura, MD;  Location: [facility] OR;  Service: Urology;  Laterality: N/A;   ENDOSCOPIC RETROGRADE CHOLANGIOPANCREATOGRAPHY (ERCP) WITH PROPOFOL N/A 06/02/2022   Procedure: ENDOSCOPIC RETROGRADE CHOLANGIOPANCREATOGRAPHY (ERCP) WITH PROPOFOL;  Surgeon: Meridee Score Netty Starring., MD;  Location: [facility];  Service: Gastroenterology;  Laterality: N/A;   ERCP N/A 04/21/2022   Procedure: ATTEMPTED ENDOSCOPIC RETROGRADE CHOLANGIOPANCREATOGRAPHY (ERCP);  Surgeon: Iva Boop, MD;  Location: [facility] OR;  Service: Gastroenterology;  Laterality: N/A;   ESOPHAGOGASTRODUODENOSCOPY (EGD) WITH PROPOFOL N/A 06/02/2022   Procedure: ESOPHAGOGASTRODUODENOSCOPY (EGD) WITH PROPOFOL;  Surgeon: Meridee Score Netty Starring., MD;  Location: [facility];  Service: Gastroenterology;  Laterality: N/A;   ESOPHAGOGASTRODUODENOSCOPY (EGD) WITH PROPOFOL N/A 07/22/2022   Procedure: ESOPHAGOGASTRODUODENOSCOPY (EGD) WITH PROPOFOL;  Surgeon: Iva Boop, MD;  Location: [facility];  Service: Gastroenterology;  Laterality: N/A;   EYE SURGERY Bilateral 2022   bilateral cataracts   HEMOSTASIS CLIP PLACEMENT  06/02/2022  Procedure: HEMOSTASIS CLIP PLACEMENT;  Surgeon: Lemar Lofty., MD;  Location: [facility];  Service: Gastroenterology;;   INSERTION OF MESH N/A 03/11/2019   Procedure: Insertion Of Mesh;  Surgeon: Axel Filler, MD;  Location: [facility] OR;  Service: General;  Laterality: N/A;   INTRAOPERATIVE CHOLANGIOGRAM N/A 04/21/2022   Procedure: INTRAOPERATIVE CHOLANGIOGRAM;  Surgeon: Diamantina Monks, MD;  Location: [facility] OR;  Service: General;  Laterality: N/A;   IR EXCHANGE BILIARY DRAIN  02/14/2022   IR PERC CHOLECYSTOSTOMY  12/29/2021   JOINT REPLACEMENT     KNEE ARTHROSCOPY WITH MENISCAL REPAIR  Right 01/30/2011   LAPAROTOMY N/A 01/04/2022   Procedure: EXPLORATORY LAPAROTOMY WITH EXTRAPERITONEAL BLADDER REPAIR;  Surgeon: Heloise Purpura, MD;  Location: [facility] OR;  Service: Urology;  Laterality: N/A;   PANCREATIC STENT PLACEMENT  06/02/2022   Procedure: PANCREATIC STENT PLACEMENT;  Surgeon: Meridee Score Netty Starring., MD;  Location: [facility];  Service: Gastroenterology;;   PANCREATIC STENT PLACEMENT  07/22/2022   Procedure: PANCREATIC STENT REMOVAL;  Surgeon: Iva Boop, MD;  Location: [facility];  Service: Gastroenterology;;   POLYPECTOMY  06/02/2022   Procedure: POLYPECTOMY;  Surgeon: Lemar Lofty., MD;  Location: [facility];  Service: Gastroenterology;;   right finger surgery     as a child   SPHINCTEROTOMY  06/02/2022   Procedure: Dennison Mascot;  Surgeon: Lemar Lofty., MD;  Location: [facility];  Service: Gastroenterology;;   TONSILLECTOMY     TOTAL KNEE ARTHROPLASTY Left 07/28/2017   Procedure: LEFT TOTAL KNEE ARTHROPLASTY;  Surgeon: Sheral Apley, MD;  Location: [facility] OR;  Service: Orthopedics;  Laterality: Left;   TOTAL KNEE ARTHROPLASTY Right 07/29/2022   Procedure: TOTAL KNEE ARTHROPLASTY;  Surgeon: Sheral Apley, MD;  Location: [facility] ORS;  Service: Orthopedics;  Laterality: Right;   TOTAL SHOULDER ARTHROPLASTY  05/29/2011   Procedure: TOTAL SHOULDER ARTHROPLASTY;  Surgeon: Vania Rea Supple;  Location: [facility] OR;  Service: Orthopedics;  Laterality: Right;   TOTAL SHOULDER ARTHROPLASTY Left 06/08/2014   DR SUPPLE   TOTAL SHOULDER ARTHROPLASTY Left 06/08/2014   Procedure: LEFT TOTAL SHOULDER ARTHROPLASTY;  Surgeon: Senaida Lange, MD;  Location: [facility] OR;  Service: Orthopedics;  Laterality: Left;   UMBILICAL HERNIA REPAIR N/A 03/11/2019   Procedure: LAPAROSCOPIC UMBILICAL HERNIA;  Surgeon: Axel Filler, MD;  Location: [facility] OR;  Service: General;  Laterality: N/A;    Social History   Socioeconomic History   Marital status: Married    Spouse name:  Marjolin- Margie   Number of children: 2   Years of education: Not on file   Highest education level: Not on file  Occupational History   Occupation: Retired  Tobacco Use   Smoking status: Former    Current packs/day: 0.00    Average packs/day: 1.5 packs/day for 42.0 years (63.1 ttl pk-yrs)    Types: Cigarettes    Start date: 47    Quit date: 07/14/2010    Years since quitting: 12.5   Smokeless tobacco: Never   Tobacco comments:    quit smoking about 5yrs ago  Vaping Use   Vaping status: Never Used  Substance and Sexual Activity   Alcohol use: Not Currently    Comment: no alcohol since 07   Drug use: No   Sexual activity: Never    Birth control/protection: None  Other Topics Concern   Not on file  Social History Narrative   Lives [place] with wife and dog.  Former Contractor.   Social Determinants of  Health   Financial Resource Strain: Low Risk  (12/24/2022)   Overall Financial Resource Strain (CARDIA)    Difficulty of Paying Living Expenses: Not hard at all  Food Insecurity: No Food Insecurity (12/24/2022)   Hunger Vital Sign    Worried About Running Out of Food in the Last Year: Never true    Ran Out of Food in the Last Year: Never true  Transportation Needs: No Transportation Needs (12/24/2022)   PRAPARE - Administrator, Civil Service (Medical): No    Lack of Transportation (Non-Medical): No  Physical Activity: Inactive (12/24/2022)   Exercise Vital Sign    Days of Exercise per Week: 0 days    Minutes of Exercise per Session: 0 min  Stress: No Stress Concern Present (12/24/2022)   [employer] of Occupational Health - Occupational Stress Questionnaire    Feeling of Stress : Not at all  Social Connections: Moderately Isolated (12/24/2022)   Social Connection and Isolation Panel [NHANES]    Frequency of Communication with Friends and Family: Three times a week    Frequency of Social Gatherings with Friends  and Family: Never    Attends Religious Services: Never    Database administrator or Organizations: No    Attends Banker Meetings: Never    Marital Status: Married  Catering manager Violence: Not At Risk (12/24/2022)   Humiliation, Afraid, Rape, and Kick questionnaire    Fear of Current or Ex-Partner: No    Emotionally Abused: No    Physically Abused: No    Sexually Abused: No    Family History  Problem Relation Age of Onset   Bradycardia Mother 47       Requiring pacemeaker placement   Coronary artery disease Father 81       Died of myocardial infarction   Coronary artery disease Brother 37       Died of myocardial infarction   Obesity Daughter    Healthy Son    Colon cancer Neg Hx    Esophageal cancer Neg Hx    Stomach cancer Neg Hx    Colon polyps Neg Hx     ROS: no fevers or chills, productive cough, hemoptysis, dysphasia, odynophagia, melena, hematochezia, dysuria, hematuria, rash, seizure activity, orthopnea, PND, claudication. Remaining systems are negative.  Physical Exam: Well-developed well-nourished in no acute distress.  Skin is warm and dry.  HEENT is normal.  Neck is supple.  Chest with diminished breath sounds right lower lobe Cardiovascular exam is irregular.  3/6 systolic murmur left sternal border. Abdominal exam nontender or distended. No masses palpated. Extremities show trace-1+ edema. neuro grossly intact  EKG Interpretation Date/Time:  Thursday January 22 2023 11:45:39 EDT Ventricular Rate:  73 PR Interval:    QRS Duration:  90 QT Interval:  420 QTC Calculation: 462 R Axis:   44  Text Interpretation: Atrial fibrillation with premature ventricular or aberrantly conducted complexes Low voltage QRS Cannot rule out Anterior infarct , age undetermined When compared with ECG of 07-Jan-2022 06:38, Nonspecific T wave abnormality has replaced inverted T waves in Inferior leads T wave inversion less evident in Anterolateral leads Confirmed by  [name] ([id number]) on 01/22/2023 12:30:07 PM   A/P  1 aortic stenosis/aortic insufficiency-patient has new onset dyspnea on exertion and lower extremity edema.  Follow-up echocardiogram shows newly reduced LV function and now severe aortic stenosis.  I feel that we should proceed with consideration of aortic valve replacement.  Will arrange right and left cardiac  catheterization with one of our structural cardiologist.  He understands that he will likely require TAVR.  The risk and benefits of cardiac catheterization including myocardial infarction, CVA and death discussed and he agrees to proceed.  Hold Coumadin 5 days prior to the procedure.  Given reduced LV function his risk of CVA off of anticoagulation is higher and we will therefore bridge with Lovenox.  Add aspirin 81 mg daily.  2 acute systolic congestive heart failure/cardiomyopathy-LV function had normalized but is now again reduced.  He is also volume overloaded on examination.  I have asked him to take Lasix 20 mg daily, spironolactone 12.5 mg daily and Farxiga 10 mg daily.  Check potassium and renal function in 1 week.  Continue carvedilol but discontinue losartan and treat with Entresto 24/26 twice daily.  Will titrate medications as tolerated.  Plan is cardiac catheterization to rule out obstructive coronary disease and prior to TAVR as outlined above.  Note patient also has diminished breath sounds right lower lobe.  Will check PA and lateral chest x-ray.  3 permanent atrial fibrillation-continue carvedilol and Coumadin (will hold 5 days prior to cardiac catheterization and bridge with Lovenox as outlined above) with goal INR 2-3.  He has declined DOAC's due to expense.  Check hemoglobin.  4 coronary artery disease-continue statin.    5 hypertension-blood pressure controlled.  Medication adjustments as outlined above.  6 hyperlipidemia-continue statin.  Check lipids and liver.  [name], MD

## 2023-01-15 NOTE — H&P (View-Only) (Signed)
HPI: FU NICM and AS. He also has a history of coronary artery disease with cardiomyopathy out of proportion, alcohol use, now resolved and paroxysmal atrial fibrillation. Cardiac catheterization in October of 2007 showed a 20% left main, a 60% mid LAD and a 60-70% distal LAD. There was a 30-40% first diagonal. There was a 30% circumflex and a 40% OM. There was a 30-40% right coronary artery. Ejection fraction was 25-30%. Abdominal ultrasound in February 2012 showed no aneurysm. ABIs October 2017 normal. Patient found to have recurrent atrial fibrillation September 2020 that was asymptomatic. Pt underwent successful cardioversion February 2021. Echo July 2023 showed normal LV function, moderate left atrial enlargement, mild mitral regurgitation, mild aortic insufficiency and moderate aortic stenosis (mean gradient 17.3 mmHg with valve area 1.1 cm).  Echocardiogram repeated July 2024 and showed newly reduced LV function with ejection fraction 35%, mild left ventricular hypertrophy, mild RV dysfunction, severe left atrial enlargement, moderate right atrial enlargement, mild to moderate mitral regurgitation, moderate tricuspid regurgitation, calcified aortic valve with mean gradient 20 mmHg, aortic valve area 0.64 cm suggestive of severe low-flow/low gradient aortic stenosis and mild aortic insufficiency; small pericardial effusion.  Since he was last seen, patient has developed dyspnea on exertion which is new.  He also notes mild bilateral lower extremity edema.  He denies chest pain, palpitations or syncope.  Current Outpatient Medications  Medication Sig Dispense Refill   acetaminophen (TYLENOL) 500 MG tablet Take 2 tablets (1,000 mg total) by mouth 4 (four) times daily. (Patient taking differently: Take 1,000 mg by mouth every 6 (six) hours as needed for moderate pain.) 120 tablet 3   albuterol (VENTOLIN HFA) 108 (90 Base) MCG/ACT inhaler Inhale 2 puffs into the lungs every 6 (six) hours as needed  for wheezing or shortness of breath. 8 g 2   atorvastatin (LIPITOR) 40 MG tablet TAKE 1 TABLET EVERY DAY 90 tablet 3   carvedilol (COREG) 6.25 MG tablet Take 1 tablet (6.25 mg total) by mouth 2 (two) times daily with a meal. 30 tablet 5   docusate sodium (COLACE) 100 MG capsule Take 1 capsule (100 mg total) by mouth 2 (two) times daily. 60 capsule 1   finasteride (PROSCAR) 5 MG tablet Take 1 tablet (5 mg total) by mouth daily. 30 tablet 5   fluticasone (FLONASE) 50 MCG/ACT nasal spray Place 2 sprays into both nostrils daily as needed for allergies or rhinitis. 16 g 0   loratadine (CLARITIN) 10 MG tablet Take 10 mg by mouth daily as needed (wheezing).     losartan (COZAAR) 50 MG tablet TAKE 1 TABLET EVERY DAY 90 tablet 3   warfarin (COUMADIN) 2.5 MG tablet Take 1/2 a tablet to 1 tablet by mouth daily as directed by the coumadin clinic 90 tablet 0   allopurinol (ZYLOPRIM) 100 MG tablet Take 1 tablet (100 mg total) by mouth daily. (Patient not taking: Reported on 01/22/2023) 90 tablet 3   ePHEDrine HCl (PRIMATENE) 12.5 MG TABS Take 12.5 mg by mouth daily as needed (wheezing). (Patient not taking: Reported on 01/22/2023)     methocarbamol (ROBAXIN-750) 750 MG tablet Take 1 tablet (750 mg total) by mouth every 8 (eight) hours as needed for muscle spasms. (Patient not taking: Reported on 01/22/2023) 20 tablet 0   ondansetron (ZOFRAN-ODT) 4 MG disintegrating tablet Take 1 tablet (4 mg total) by mouth every 8 (eight) hours as needed for nausea or vomiting. (Patient not taking: Reported on 01/22/2023) 15 tablet 0   oxyCODONE (ROXICODONE)  5 MG immediate release tablet Take 1 tablet (5 mg total) by mouth every 4 (four) hours as needed for severe pain. (Patient not taking: Reported on 01/22/2023) 30 tablet 0   silodosin (RAPAFLO) 8 MG CAPS capsule Take 1 capsule (8 mg total) by mouth daily with breakfast. (Patient not taking: Reported on 01/22/2023) 30 capsule 3   No current facility-administered medications for this  visit.     Past Medical History:  Diagnosis Date   Allergic rhinitis 08/08/2013   takes Loratadine daily    Aortic stenosis 09/08/2011   With mild aortic regurgitation, mean gradient 13 mmHg    Cataract    right but immature   CHF (congestive heart failure) (HCC) 2007   Complication of anesthesia    stopped breathing - during shoulder surgery 2009-2010   Coronary artery disease 10/02/2009   Cardiac cath (October 2007): 20% left main, 60% mid LAD, 60-70% distal LAD, 30-40% first diagonal, 30% circumflex, 40% OM, 30-40% RCA    Degenerative joint disease of shoulder 08/03/2013   Bilateral, s/p right total shoulder arthroplasty 05/29/2011 and left shoulder arthroplasty 06/08/2014   Diastolic dysfunction 04/04/2016   Echo (04/04/2016): Grade I   Diverticulosis 10/07/2013   Seen on colonoscopy in 2007    Erectile dysfunction 05/07/2009   Essential hypertension 08/03/2013   had been on Lisinopril but stopped per MD   First degree AV block 03/14/2016   Gastric ulcer 10/07/2013   Seen on EGD 04/10/2006    Hemorrhoids 10/07/2013   Hyperlipidemia 10/02/2009   Paroxysmal atrial fibrillation (HCC) 07/14/2006   One episode, provoked by alcohol use disorder, briefly anticoagulated with warfarin, then switched to aspirin alone   Pre-diabetes    Sciatica associated with disorder of lumbar spine 08/03/2013   Anterolisthesis with right L 4-5 nerve root compression.  Treated with epidural injections and Physical Therapy.    Seborrheic keratosis 10/07/2013   Tubular adenoma of colon     Past Surgical History:  Procedure Laterality Date   BIOPSY  06/02/2022   Procedure: BIOPSY;  Surgeon: Meridee Score Netty Starring., MD;  Location: [facility];  Service: Gastroenterology;;   CARDIAC CATHETERIZATION  2007   CARDIOVERSION     CARDIOVERSION N/A 08/04/2019   Procedure: CARDIOVERSION;  Surgeon: Pricilla Riffle, MD;  Location: [facility];  Service: Cardiovascular;  Laterality: N/A;   CHOLECYSTECTOMY  N/A 04/21/2022   Procedure: LAPAROSCOPIC CHOLECYSTECTOMY;  Surgeon: Diamantina Monks, MD;  Location: [facility] OR;  Service: General;  Laterality: N/A;   COLONOSCOPY     CYSTOSCOPY N/A 01/04/2022   Procedure: Bluford Kaufmann;  Surgeon: Heloise Purpura, MD;  Location: [facility] OR;  Service: Urology;  Laterality: N/A;   ENDOSCOPIC RETROGRADE CHOLANGIOPANCREATOGRAPHY (ERCP) WITH PROPOFOL N/A 06/02/2022   Procedure: ENDOSCOPIC RETROGRADE CHOLANGIOPANCREATOGRAPHY (ERCP) WITH PROPOFOL;  Surgeon: Meridee Score Netty Starring., MD;  Location: [facility];  Service: Gastroenterology;  Laterality: N/A;   ERCP N/A 04/21/2022   Procedure: ATTEMPTED ENDOSCOPIC RETROGRADE CHOLANGIOPANCREATOGRAPHY (ERCP);  Surgeon: Iva Boop, MD;  Location: [facility] OR;  Service: Gastroenterology;  Laterality: N/A;   ESOPHAGOGASTRODUODENOSCOPY (EGD) WITH PROPOFOL N/A 06/02/2022   Procedure: ESOPHAGOGASTRODUODENOSCOPY (EGD) WITH PROPOFOL;  Surgeon: Meridee Score Netty Starring., MD;  Location: [facility];  Service: Gastroenterology;  Laterality: N/A;   ESOPHAGOGASTRODUODENOSCOPY (EGD) WITH PROPOFOL N/A 07/22/2022   Procedure: ESOPHAGOGASTRODUODENOSCOPY (EGD) WITH PROPOFOL;  Surgeon: Iva Boop, MD;  Location: [facility];  Service: Gastroenterology;  Laterality: N/A;   EYE SURGERY Bilateral 2022   bilateral cataracts   HEMOSTASIS CLIP PLACEMENT  06/02/2022  Procedure: HEMOSTASIS CLIP PLACEMENT;  Surgeon: Lemar Lofty., MD;  Location: [facility];  Service: Gastroenterology;;   INSERTION OF MESH N/A 03/11/2019   Procedure: Insertion Of Mesh;  Surgeon: Axel Filler, MD;  Location: [facility] OR;  Service: General;  Laterality: N/A;   INTRAOPERATIVE CHOLANGIOGRAM N/A 04/21/2022   Procedure: INTRAOPERATIVE CHOLANGIOGRAM;  Surgeon: Diamantina Monks, MD;  Location: [facility] OR;  Service: General;  Laterality: N/A;   IR EXCHANGE BILIARY DRAIN  02/14/2022   IR PERC CHOLECYSTOSTOMY  12/29/2021   JOINT REPLACEMENT     KNEE ARTHROSCOPY WITH MENISCAL REPAIR  Right 01/30/2011   LAPAROTOMY N/A 01/04/2022   Procedure: EXPLORATORY LAPAROTOMY WITH EXTRAPERITONEAL BLADDER REPAIR;  Surgeon: Heloise Purpura, MD;  Location: [facility] OR;  Service: Urology;  Laterality: N/A;   PANCREATIC STENT PLACEMENT  06/02/2022   Procedure: PANCREATIC STENT PLACEMENT;  Surgeon: Meridee Score Netty Starring., MD;  Location: [facility];  Service: Gastroenterology;;   PANCREATIC STENT PLACEMENT  07/22/2022   Procedure: PANCREATIC STENT REMOVAL;  Surgeon: Iva Boop, MD;  Location: [facility];  Service: Gastroenterology;;   POLYPECTOMY  06/02/2022   Procedure: POLYPECTOMY;  Surgeon: Lemar Lofty., MD;  Location: [facility];  Service: Gastroenterology;;   right finger surgery     as a child   SPHINCTEROTOMY  06/02/2022   Procedure: Dennison Mascot;  Surgeon: Lemar Lofty., MD;  Location: [facility];  Service: Gastroenterology;;   TONSILLECTOMY     TOTAL KNEE ARTHROPLASTY Left 07/28/2017   Procedure: LEFT TOTAL KNEE ARTHROPLASTY;  Surgeon: Sheral Apley, MD;  Location: [facility] OR;  Service: Orthopedics;  Laterality: Left;   TOTAL KNEE ARTHROPLASTY Right 07/29/2022   Procedure: TOTAL KNEE ARTHROPLASTY;  Surgeon: Sheral Apley, MD;  Location: [facility] ORS;  Service: Orthopedics;  Laterality: Right;   TOTAL SHOULDER ARTHROPLASTY  05/29/2011   Procedure: TOTAL SHOULDER ARTHROPLASTY;  Surgeon: Vania Rea Supple;  Location: [facility] OR;  Service: Orthopedics;  Laterality: Right;   TOTAL SHOULDER ARTHROPLASTY Left 06/08/2014   DR SUPPLE   TOTAL SHOULDER ARTHROPLASTY Left 06/08/2014   Procedure: LEFT TOTAL SHOULDER ARTHROPLASTY;  Surgeon: Senaida Lange, MD;  Location: [facility] OR;  Service: Orthopedics;  Laterality: Left;   UMBILICAL HERNIA REPAIR N/A 03/11/2019   Procedure: LAPAROSCOPIC UMBILICAL HERNIA;  Surgeon: Axel Filler, MD;  Location: [facility] OR;  Service: General;  Laterality: N/A;    Social History   Socioeconomic History   Marital status: Married    Spouse name:  Marjolin- Margie   Number of children: 2   Years of education: Not on file   Highest education level: Not on file  Occupational History   Occupation: Retired  Tobacco Use   Smoking status: Former    Current packs/day: 0.00    Average packs/day: 1.5 packs/day for 42.0 years (63.1 ttl pk-yrs)    Types: Cigarettes    Start date: 51    Quit date: 07/14/2010    Years since quitting: 12.5   Smokeless tobacco: Never   Tobacco comments:    quit smoking about 73yrs ago  Vaping Use   Vaping status: Never Used  Substance and Sexual Activity   Alcohol use: Not Currently    Comment: no alcohol since 07   Drug use: No   Sexual activity: Never    Birth control/protection: None  Other Topics Concern   Not on file  Social History Narrative   Lives [place] with wife and dog.  Former Contractor.   Social Determinants of  Health   Financial Resource Strain: Low Risk  (12/24/2022)   Overall Financial Resource Strain (CARDIA)    Difficulty of Paying Living Expenses: Not hard at all  Food Insecurity: No Food Insecurity (12/24/2022)   Hunger Vital Sign    Worried About Running Out of Food in the Last Year: Never true    Ran Out of Food in the Last Year: Never true  Transportation Needs: No Transportation Needs (12/24/2022)   PRAPARE - Administrator, Civil Service (Medical): No    Lack of Transportation (Non-Medical): No  Physical Activity: Inactive (12/24/2022)   Exercise Vital Sign    Days of Exercise per Week: 0 days    Minutes of Exercise per Session: 0 min  Stress: No Stress Concern Present (12/24/2022)   [employer] of Occupational Health - Occupational Stress Questionnaire    Feeling of Stress : Not at all  Social Connections: Moderately Isolated (12/24/2022)   Social Connection and Isolation Panel [NHANES]    Frequency of Communication with Friends and Family: Three times a week    Frequency of Social Gatherings with Friends  and Family: Never    Attends Religious Services: Never    Database administrator or Organizations: No    Attends Banker Meetings: Never    Marital Status: Married  Catering manager Violence: Not At Risk (12/24/2022)   Humiliation, Afraid, Rape, and Kick questionnaire    Fear of Current or Ex-Partner: No    Emotionally Abused: No    Physically Abused: No    Sexually Abused: No    Family History  Problem Relation Age of Onset   Bradycardia Mother 20       Requiring pacemeaker placement   Coronary artery disease Father 74       Died of myocardial infarction   Coronary artery disease Brother 45       Died of myocardial infarction   Obesity Daughter    Healthy Son    Colon cancer Neg Hx    Esophageal cancer Neg Hx    Stomach cancer Neg Hx    Colon polyps Neg Hx     ROS: no fevers or chills, productive cough, hemoptysis, dysphasia, odynophagia, melena, hematochezia, dysuria, hematuria, rash, seizure activity, orthopnea, PND, claudication. Remaining systems are negative.  Physical Exam: Well-developed well-nourished in no acute distress.  Skin is warm and dry.  HEENT is normal.  Neck is supple.  Chest with diminished breath sounds right lower lobe Cardiovascular exam is irregular.  3/6 systolic murmur left sternal border. Abdominal exam nontender or distended. No masses palpated. Extremities show trace-1+ edema. neuro grossly intact  EKG Interpretation Date/Time:  Thursday January 22 2023 11:45:39 EDT Ventricular Rate:  73 PR Interval:    QRS Duration:  90 QT Interval:  420 QTC Calculation: 462 R Axis:   44  Text Interpretation: Atrial fibrillation with premature ventricular or aberrantly conducted complexes Low voltage QRS Cannot rule out Anterior infarct , age undetermined When compared with ECG of 07-Jan-2022 06:38, Nonspecific T wave abnormality has replaced inverted T waves in Inferior leads T wave inversion less evident in Anterolateral leads Confirmed by  [name] ([id number]) on 01/22/2023 12:30:07 PM   A/P  1 aortic stenosis/aortic insufficiency-patient has new onset dyspnea on exertion and lower extremity edema.  Follow-up echocardiogram shows newly reduced LV function and now severe aortic stenosis.  I feel that we should proceed with consideration of aortic valve replacement.  Will arrange right and left cardiac  catheterization with one of our structural cardiologist.  He understands that he will likely require TAVR.  The risk and benefits of cardiac catheterization including myocardial infarction, CVA and death discussed and he agrees to proceed.  Hold Coumadin 5 days prior to the procedure.  Given reduced LV function his risk of CVA off of anticoagulation is higher and we will therefore bridge with Lovenox.  Add aspirin 81 mg daily.  2 acute systolic congestive heart failure/cardiomyopathy-LV function had normalized but is now again reduced.  He is also volume overloaded on examination.  I have asked him to take Lasix 20 mg daily, spironolactone 12.5 mg daily and Farxiga 10 mg daily.  Check potassium and renal function in 1 week.  Continue carvedilol but discontinue losartan and treat with Entresto 24/26 twice daily.  Will titrate medications as tolerated.  Plan is cardiac catheterization to rule out obstructive coronary disease and prior to TAVR as outlined above.  Note patient also has diminished breath sounds right lower lobe.  Will check PA and lateral chest x-ray.  3 permanent atrial fibrillation-continue carvedilol and Coumadin (will hold 5 days prior to cardiac catheterization and bridge with Lovenox as outlined above) with goal INR 2-3.  He has declined DOAC's due to expense.  Check hemoglobin.  4 coronary artery disease-continue statin.    5 hypertension-blood pressure controlled.  Medication adjustments as outlined above.  6 hyperlipidemia-continue statin.  Check lipids and liver.  [name], MD

## 2023-01-22 ENCOUNTER — Ambulatory Visit
Admission: RE | Admit: 2023-01-22 | Discharge: 2023-01-22 | Disposition: A | Discharge status: Home / Self Care | Payer: Medicare HMO | Source: Ambulatory Visit | Attending: Cardiology | Admitting: Cardiology

## 2023-01-22 ENCOUNTER — Other Ambulatory Visit: Payer: Self-pay | Admitting: *Deleted

## 2023-01-22 ENCOUNTER — Telehealth: Payer: Self-pay | Admitting: Pharmacist Clinician (PhC)/ Clinical Pharmacy Specialist

## 2023-01-22 ENCOUNTER — Ambulatory Visit (INDEPENDENT_AMBULATORY_CARE_PROVIDER_SITE_OTHER): Payer: Medicare HMO | Admitting: Pharmacist Clinician (PhC)/ Clinical Pharmacy Specialist

## 2023-01-22 ENCOUNTER — Encounter: Payer: Self-pay | Admitting: Cardiology

## 2023-01-22 ENCOUNTER — Ambulatory Visit: Payer: Medicare HMO | Attending: Cardiology | Admitting: Cardiology

## 2023-01-22 VITALS — BP 142/66 | HR 71 | Ht 65.0 in | Wt 151.0 lb

## 2023-01-22 DIAGNOSIS — I2510 Atherosclerotic heart disease of native coronary artery without angina pectoris: Secondary | ICD-10-CM | POA: Diagnosis not present

## 2023-01-22 DIAGNOSIS — I5021 Acute systolic (congestive) heart failure: Secondary | ICD-10-CM

## 2023-01-22 DIAGNOSIS — J90 Pleural effusion, not elsewhere classified: Secondary | ICD-10-CM | POA: Diagnosis not present

## 2023-01-22 DIAGNOSIS — I4891 Unspecified atrial fibrillation: Secondary | ICD-10-CM | POA: Diagnosis not present

## 2023-01-22 DIAGNOSIS — E785 Hyperlipidemia, unspecified: Secondary | ICD-10-CM | POA: Diagnosis not present

## 2023-01-22 DIAGNOSIS — Z5181 Encounter for therapeutic drug level monitoring: Secondary | ICD-10-CM | POA: Diagnosis not present

## 2023-01-22 DIAGNOSIS — I350 Nonrheumatic aortic (valve) stenosis: Secondary | ICD-10-CM

## 2023-01-22 DIAGNOSIS — R0602 Shortness of breath: Secondary | ICD-10-CM

## 2023-01-22 DIAGNOSIS — I4821 Permanent atrial fibrillation: Secondary | ICD-10-CM

## 2023-01-22 LAB — POCT INR: POC INR: 2.7

## 2023-01-22 MED ORDER — SACUBITRIL-VALSARTAN 24-26 MG PO TABS
1.0000 | ORAL_TABLET | Freq: Two times a day (BID) | ORAL | Status: DC
Start: 2023-01-22 — End: 2023-04-30

## 2023-01-22 MED ORDER — DAPAGLIFLOZIN PROPANEDIOL 10 MG PO TABS
10.0000 mg | ORAL_TABLET | Freq: Every day | ORAL | Status: DC
Start: 2023-01-22 — End: 2023-02-12

## 2023-01-22 MED ORDER — FUROSEMIDE 20 MG PO TABS
40.0000 mg | ORAL_TABLET | Freq: Every day | ORAL | Status: DC
Start: 2023-01-22 — End: 2023-02-12

## 2023-01-22 MED ORDER — SPIRONOLACTONE 25 MG PO TABS
12.5000 mg | ORAL_TABLET | Freq: Every day | ORAL | 3 refills | Status: DC
Start: 2023-01-22 — End: 2023-08-28

## 2023-01-22 MED ORDER — ASPIRIN 81 MG PO TBEC
81.0000 mg | DELAYED_RELEASE_TABLET | Freq: Every day | ORAL | Status: AC
Start: 2023-01-22 — End: ?

## 2023-01-22 MED ORDER — FUROSEMIDE 20 MG PO TABS
20.0000 mg | ORAL_TABLET | Freq: Every day | ORAL | Status: DC
Start: 2023-01-22 — End: 2023-01-22

## 2023-01-22 MED ORDER — DAPAGLIFLOZIN PROPANEDIOL 10 MG PO TABS
10.0000 mg | ORAL_TABLET | Freq: Every day | ORAL | 6 refills | Status: DC
Start: 2023-01-22 — End: 2023-01-22

## 2023-01-22 MED ORDER — SACUBITRIL-VALSARTAN 24-26 MG PO TABS: 1.0000 | ORAL_TABLET | Freq: Two times a day (BID) | ORAL | 6 refills | Status: DC

## 2023-01-22 MED ORDER — ENOXAPARIN SODIUM 100 MG/ML IJ SOSY: 100.0000 mg | PREFILLED_SYRINGE | INTRAMUSCULAR | 0 refills | Status: DC

## 2023-01-22 NOTE — Addendum Note (Signed)
Addended by: Freddi Starr on: 01/22/2023 01:14 PM   Modules accepted: Orders

## 2023-01-22 NOTE — Patient Instructions (Addendum)
July 25: Last dose of warfarin.  July 26: No warfarin or enoxaparin (Lovenox).  July 27: Inject enoxaparin 100 mg in the fatty abdominal tissue at least 2 inches from the belly button in the evening, around 8pm rotate sites. No warfarin.  July 28: Inject enoxaparin in the fatty tissue around 8pm. No warfarin.  July 29:  Inject enoxaparin in the fatty tissue around 8pm. No warfarin.  July 30: No enoxaparin,  No warfarin.  July 31: Procedure Day - No enoxaparin - Resume warfarin in the evening - 1 tablet (2.5 mg)  August 1: Resume enoxaparin inject in the fatty tissue at 8 pm and take warfarin - 1.5 tablets (3.75 mg)  August 2: Inject enoxaparin in the fatty tissue at 8 pm and take warfarin - 1.5 tablets (3.75 mg)   August 3: Inject enoxaparin in the fatty tissue at 8 pm and take warfarin - 1 tablet (2.5 mg)  August 4: Inject enoxaparin in the fatty tissue at 8 pm and take warfarin - 1/2 tablet (1.25 mg)  August 5: Inject enoxaparin in the fatty tissue at 8 pm and take warfarin - 1 tablet (2.5 mg)  August 6: No injections,  warfarin appt to check INR.

## 2023-01-22 NOTE — Patient Instructions (Signed)
Medication Instructions:   STOP LOSARTAN  START ASPIRIN 81 MG ONCE DAILY  START SPIRONOLACTONE 12.5 MG ONCE DAILY=1/2 OF THE 25 MG TABLET ONCE DAILY  START FARXIGA 10 MG ONCE DAILY  START ENTRESTO 24/26 MG ONE TABLET TWICE DAILY  TAKE FUROSEMIDE 20 MG ONCE DAILY  *If you need a refill on your cardiac medications before your next appointment, please call your pharmacy*   Lab Work:  Your physician recommends that you return for lab work ON MONDAY 01/26/23-FASTING  If you have labs (blood work) drawn today and your tests are completely normal, you will receive your results only by: MyChart Message (if you have MyChart) OR A paper copy in the mail If you have any lab test that is abnormal or we need to change your treatment, we will call you to review the results.   Testing/Procedures:  A chest x-ray takes a picture of the organs and structures inside the chest, including the heart, lungs, and blood vessels. This test can show several things, including, whether the heart is enlarges; whether fluid is building up in the lungs; and whether pacemaker / defibrillator leads are still in place. Ginette Otto [id number]    [facility] A DEPT OF [place]. [facility] AT [facility] [address] [place] [address] [place] [address] Dept: [phone] Loc: [phone]  BRENTT FREAD  01/22/2023  You are scheduled for a Cardiac Catheterization on Wednesday, July 31 with Dr. Verne Carrow.  1. Please arrive at the [facility] (Main Entrance A) at [facility]: [address] [place], [place] [address] at 6:30 AM (This time is 2 hour(s) before your procedure to ensure your preparation). Free valet parking service is available. You will check in at ADMITTING. The support person will be asked to wait in the waiting room.  It is OK to have someone drop you off and come back when you are ready to be discharged.     Special note: Every effort is made to have your procedure done on time. Please understand that emergencies sometimes delay scheduled procedures.  2. Diet: Do not eat solid foods after midnight.  The patient may have clear liquids until 5am upon the day of the procedure.  3. Labs: You will need to have blood drawn on Monday 01/26/23 fasting.  4. Medication instructions in preparation for your procedure:  On the morning of your procedure, take your Aspirin 81 mg and any morning medicines NOT listed above.  You may use sips of water.  5. Plan to go home the same day, you will only stay overnight if medically necessary. 6. Bring a current list of your medications and current insurance cards. 7. You MUST have a responsible person to drive you home. 8. Someone MUST be with you the first 24 hours after you arrive home or your discharge will be delayed. 9. Please wear clothes that are easy to get on and off and wear slip-on shoes.  Thank you for allowing [place] to care for you!   -- [place] [facility]    Follow-Up: At [facility], you and your health needs are our priority.  As part of our continuing mission to provide you with exceptional heart care, we have created designated Provider Care Teams.  These Care Teams include your primary Cardiologist (physician) and Advanced Practice Providers (APPs -  Physician Assistants and Nurse Practitioners) who all work together to provide you with the care  you need, when you need it.  We recommend signing up for the patient portal called "MyChart".  Sign up information is provided on this After Visit Summary.  MyChart is used to connect with patients for Virtual Visits (Telemedicine).  Patients are able to view lab/test results, encounter notes, upcoming appointments, etc.  Non-urgent messages can be sent to your provider as well.   To learn more about what you can do with MyChart, go to [email or web address].    Your  next appointment:   3 month(s)  Provider:   [name], MD

## 2023-01-22 NOTE — Telephone Encounter (Signed)
error 

## 2023-01-26 ENCOUNTER — Ambulatory Visit: Payer: Medicare HMO

## 2023-01-26 DIAGNOSIS — I4821 Permanent atrial fibrillation: Secondary | ICD-10-CM | POA: Diagnosis not present

## 2023-01-26 DIAGNOSIS — E785 Hyperlipidemia, unspecified: Secondary | ICD-10-CM | POA: Diagnosis not present

## 2023-01-27 ENCOUNTER — Telehealth: Payer: Self-pay | Admitting: *Deleted

## 2023-01-27 NOTE — Telephone Encounter (Signed)
Cardiac Catheterization scheduled at [facility] for: Wednesday January 28, 2023 8:30 AM Arrival time [facility] Main Entrance A at: 6:30 AM  Nothing to eat after midnight prior to procedure, clear liquids until 5 AM day of procedure.  Medication instructions: -Hold: Warfarin-none 01/23/23 until post procedure/Lovenox bridge-see Anti-coag visit 01/22/23 for details Farxiga-AM of procedure Lasix -AM of procedure -Other usual morning medications can be taken with sips of water including aspirin 81 mg.  Confirmed patient has responsible adult to drive home post procedure and be with patient first 24 hours after arriving home.  Plan to go home the same day, you will only stay overnight if medically necessary.  Reviewed procedure instructions with patient.

## 2023-01-28 ENCOUNTER — Other Ambulatory Visit: Payer: Self-pay

## 2023-01-28 ENCOUNTER — Ambulatory Visit (HOSPITAL_COMMUNITY)
Admission: RE | Admit: 2023-01-28 | Discharge: 2023-01-28 | Disposition: A | Discharge status: Home / Self Care | Payer: Medicare HMO | Source: Ambulatory Visit | Attending: Cardiovascular Disease | Admitting: Cardiovascular Disease

## 2023-01-28 ENCOUNTER — Encounter (HOSPITAL_COMMUNITY)
Admission: RE | Disposition: A | Discharge status: Home / Self Care | Payer: Self-pay | Source: Ambulatory Visit | Attending: Cardiovascular Disease

## 2023-01-28 DIAGNOSIS — R0609 Other forms of dyspnea: Secondary | ICD-10-CM | POA: Diagnosis not present

## 2023-01-28 DIAGNOSIS — I4821 Permanent atrial fibrillation: Secondary | ICD-10-CM | POA: Diagnosis not present

## 2023-01-28 DIAGNOSIS — E785 Hyperlipidemia, unspecified: Secondary | ICD-10-CM | POA: Insufficient documentation

## 2023-01-28 DIAGNOSIS — I110 Hypertensive heart disease with heart failure: Secondary | ICD-10-CM | POA: Diagnosis not present

## 2023-01-28 DIAGNOSIS — Z7901 Long term (current) use of anticoagulants: Secondary | ICD-10-CM | POA: Insufficient documentation

## 2023-01-28 DIAGNOSIS — Z87891 Personal history of nicotine dependence: Secondary | ICD-10-CM | POA: Insufficient documentation

## 2023-01-28 DIAGNOSIS — I429 Cardiomyopathy, unspecified: Secondary | ICD-10-CM | POA: Diagnosis not present

## 2023-01-28 DIAGNOSIS — I2510 Atherosclerotic heart disease of native coronary artery without angina pectoris: Secondary | ICD-10-CM | POA: Diagnosis not present

## 2023-01-28 DIAGNOSIS — Z01812 Encounter for preprocedural laboratory examination: Secondary | ICD-10-CM

## 2023-01-28 DIAGNOSIS — Z79899 Other long term (current) drug therapy: Secondary | ICD-10-CM | POA: Diagnosis not present

## 2023-01-28 DIAGNOSIS — I5021 Acute systolic (congestive) heart failure: Secondary | ICD-10-CM | POA: Diagnosis not present

## 2023-01-28 DIAGNOSIS — I350 Nonrheumatic aortic (valve) stenosis: Secondary | ICD-10-CM | POA: Diagnosis not present

## 2023-01-28 DIAGNOSIS — I2584 Coronary atherosclerosis due to calcified coronary lesion: Secondary | ICD-10-CM | POA: Insufficient documentation

## 2023-01-28 HISTORY — PX: RIGHT/LEFT HEART CATH AND CORONARY ANGIOGRAPHY: CATH118266

## 2023-01-28 LAB — GLUCOSE, CAPILLARY
Glucose-Capillary: 87 mg/dL (ref 70–99)
Glucose-Capillary: 85 mg/dL (ref 70–99)

## 2023-01-28 LAB — POCT I-STAT EG7
Acid-base deficit: 6 mmol/L — ABNORMAL HIGH (ref 0.0–2.0)
Acid-base deficit: 2 mmol/L (ref 0.0–2.0)
Bicarbonate: 19 mmol/L — ABNORMAL LOW (ref 20.0–28.0)
Bicarbonate: 23 mmol/L (ref 20.0–28.0)
Calcium, Ion: 0.76 mmol/L — CL (ref 1.15–1.40)
Calcium, Ion: 1.01 mmol/L — ABNORMAL LOW (ref 1.15–1.40)
HCT: 30 % — ABNORMAL LOW (ref 39.0–52.0)
HCT: 33 % — ABNORMAL LOW (ref 39.0–52.0)
Hemoglobin: 10.2 g/dL — ABNORMAL LOW (ref 13.0–17.0)
Hemoglobin: 11.2 g/dL — ABNORMAL LOW (ref 13.0–17.0)
O2 Saturation: 64 %
O2 Saturation: 61 %
Potassium: 2.6 mmol/L — CL (ref 3.5–5.1)
Potassium: 3.1 mmol/L — ABNORMAL LOW (ref 3.5–5.1)
Sodium: 142 mmol/L (ref 135–145)
Sodium: 142 mmol/L (ref 135–145)
TCO2: 20 mmol/L — ABNORMAL LOW (ref 22–32)
TCO2: 24 mmol/L (ref 22–32)
pCO2, Ven: 36.5 mmHg — ABNORMAL LOW (ref 44–60)
pCO2, Ven: 40.4 mmHg — ABNORMAL LOW (ref 44–60)
pH, Ven: 7.325 (ref 7.25–7.43)
pH, Ven: 7.364 (ref 7.25–7.43)
pO2, Ven: 35 mmHg (ref 32–45)
pO2, Ven: 33 mmHg (ref 32–45)

## 2023-01-28 LAB — POCT I-STAT 7, (LYTES, BLD GAS, ICA,H+H)
Acid-base deficit: 2 mmol/L (ref 0.0–2.0)
Bicarbonate: 21.9 mmol/L (ref 20.0–28.0)
Calcium, Ion: 1.06 mmol/L — ABNORMAL LOW (ref 1.15–1.40)
HCT: 33 % — ABNORMAL LOW (ref 39.0–52.0)
Hemoglobin: 11.2 g/dL — ABNORMAL LOW (ref 13.0–17.0)
O2 Saturation: 90 %
Potassium: 3.3 mmol/L — ABNORMAL LOW (ref 3.5–5.1)
Sodium: 144 mmol/L (ref 135–145)
TCO2: 23 mmol/L (ref 22–32)
pCO2 arterial: 33.8 mmHg (ref 32–48)
pH, Arterial: 7.418 (ref 7.35–7.45)
pO2, Arterial: 57 mmHg — ABNORMAL LOW (ref 83–108)

## 2023-01-28 LAB — PROTIME-INR
INR: 1.4 — ABNORMAL HIGH (ref 0.8–1.2)
Prothrombin Time: 17 seconds — ABNORMAL HIGH (ref 11.4–15.2)

## 2023-01-28 SURGERY — RIGHT/LEFT HEART CATH AND CORONARY ANGIOGRAPHY
Anesthesia: LOCAL

## 2023-01-28 MED ORDER — MIDAZOLAM HCL 2 MG/2ML IJ SOLN
INTRAMUSCULAR | Status: AC
  Filled 2023-01-28: qty 2

## 2023-01-28 MED ORDER — VERAPAMIL HCL 2.5 MG/ML IV SOLN
INTRAVENOUS | Status: DC | PRN
  Administered 2023-01-28: 10 mL via INTRA_ARTERIAL

## 2023-01-28 MED ORDER — MIDAZOLAM HCL 2 MG/2ML IJ SOLN
INTRAMUSCULAR | Status: DC | PRN
  Administered 2023-01-28: 1 mg via INTRAVENOUS

## 2023-01-28 MED ORDER — SODIUM CHLORIDE 0.9 % IV SOLN: 250.0000 mL | INTRAVENOUS | Status: DC | PRN

## 2023-01-28 MED ORDER — ONDANSETRON HCL 4 MG/2ML IJ SOLN: 4.0000 mg | Freq: Four times a day (QID) | INTRAMUSCULAR | Status: DC | PRN

## 2023-01-28 MED ORDER — FENTANYL CITRATE (PF) 100 MCG/2ML IJ SOLN
INTRAMUSCULAR | Status: DC | PRN
  Administered 2023-01-28: 25 ug via INTRAVENOUS

## 2023-01-28 MED ORDER — HYDRALAZINE HCL 20 MG/ML IJ SOLN: 10.0000 mg | INTRAMUSCULAR | Status: DC | PRN

## 2023-01-28 MED ORDER — IOHEXOL 350 MG/ML SOLN
INTRAVENOUS | Status: DC | PRN
  Administered 2023-01-28: 72 mL

## 2023-01-28 MED ORDER — FUROSEMIDE 10 MG/ML IJ SOLN: 40.0000 mg | Freq: Once | INTRAMUSCULAR | Status: DC

## 2023-01-28 MED ORDER — SODIUM CHLORIDE 0.9% FLUSH: 3.0000 mL | Freq: Two times a day (BID) | INTRAVENOUS | Status: DC

## 2023-01-28 MED ORDER — ASPIRIN 81 MG PO CHEW: 81.0000 mg | CHEWABLE_TABLET | ORAL | Status: DC

## 2023-01-28 MED ORDER — SODIUM CHLORIDE 0.9 % WEIGHT BASED INFUSION
3.0000 mL/kg/h | INTRAVENOUS | Status: AC
  Administered 2023-01-28: 3 mL/kg/h via INTRAVENOUS

## 2023-01-28 MED ORDER — HEPARIN SODIUM (PORCINE) 1000 UNIT/ML IJ SOLN
INTRAMUSCULAR | Status: DC | PRN
  Administered 2023-01-28: 3500 [IU] via INTRAVENOUS

## 2023-01-28 MED ORDER — VERAPAMIL HCL 2.5 MG/ML IV SOLN
INTRAVENOUS | Status: AC
  Filled 2023-01-28: qty 2

## 2023-01-28 MED ORDER — LIDOCAINE HCL (PF) 1 % IJ SOLN
INTRAMUSCULAR | Status: DC | PRN
  Administered 2023-01-28: 6 mL via INTRADERMAL

## 2023-01-28 MED ORDER — FENTANYL CITRATE (PF) 100 MCG/2ML IJ SOLN
INTRAMUSCULAR | Status: AC
  Filled 2023-01-28: qty 2

## 2023-01-28 MED ORDER — SODIUM CHLORIDE 0.9% FLUSH: 3.0000 mL | INTRAVENOUS | Status: DC | PRN

## 2023-01-28 MED ORDER — HEPARIN SODIUM (PORCINE) 1000 UNIT/ML IJ SOLN
INTRAMUSCULAR | Status: AC
  Filled 2023-01-28: qty 10

## 2023-01-28 MED ORDER — LIDOCAINE HCL (PF) 1 % IJ SOLN
INTRAMUSCULAR | Status: AC
  Filled 2023-01-28: qty 30

## 2023-01-28 MED ORDER — LABETALOL HCL 5 MG/ML IV SOLN: 10.0000 mg | INTRAVENOUS | Status: DC | PRN

## 2023-01-28 MED ORDER — ACETAMINOPHEN 325 MG PO TABS: 650.0000 mg | ORAL_TABLET | ORAL | Status: DC | PRN

## 2023-01-28 MED ORDER — HEPARIN (PORCINE) IN NACL 1000-0.9 UT/500ML-% IV SOLN
INTRAVENOUS | Status: DC | PRN
  Administered 2023-01-28 (×2): 500 mL

## 2023-01-28 MED ORDER — SODIUM CHLORIDE 0.9 % WEIGHT BASED INFUSION: 1.0000 mL/kg/h | INTRAVENOUS | Status: DC

## 2023-01-28 SURGICAL SUPPLY — 13 items
CATH 5FR JL3.5 JR4 ANG PIG MP (CATHETERS) IMPLANT
CATH BALLN WEDGE 5F 110CM (CATHETERS) IMPLANT
CATH LAUNCHER 5F EBU3.5 (CATHETERS) IMPLANT
DEVICE RAD COMP TR BAND LRG (VASCULAR PRODUCTS) IMPLANT
GLIDESHEATH SLEND SS 6F .021 (SHEATH) IMPLANT
GUIDEWIRE .025 260CM (WIRE) IMPLANT
GUIDEWIRE INQWIRE 1.5J.035X260 (WIRE) IMPLANT
INQWIRE 1.5J .035X260CM (WIRE) ×1
KIT SYRINGE INJ CVI SPIKEX1 (MISCELLANEOUS) IMPLANT
PACK CARDIAC CATHETERIZATION (CUSTOM PROCEDURE TRAY) ×1 IMPLANT
SET ATX-X65L (MISCELLANEOUS) IMPLANT
SHEATH GLIDE SLENDER 4/5FR (SHEATH) IMPLANT
TUBING CIL FLEX 10 FLL-RA (TUBING) ×1 IMPLANT

## 2023-01-28 NOTE — Discharge Instructions (Addendum)

## 2023-01-28 NOTE — Interval H&P Note (Signed)
History and Physical Interval Note:  01/28/2023 6:58 AM  [name]  has presented today for surgery, with the diagnosis of aortic stenosis.  The various methods of treatment have been discussed with the patient and family. After consideration of risks, benefits and other options for treatment, the patient has consented to  Procedure(s): RIGHT/LEFT HEART CATH AND CORONARY ANGIOGRAPHY (N/A) as a surgical intervention.  The patient's history has been reviewed, patient examined, no change in status, stable for surgery.  I have reviewed the patient's chart and labs.  Questions were answered to the patient's satisfaction.    Cath Lab Visit (complete for each Cath Lab visit)  Clinical Evaluation Leading to the Procedure:   ACS: No.  Non-ACS:    Anginal Classification: CCS II  Anti-ischemic medical therapy: Minimal Therapy (1 class of medications)  Non-Invasive Test Results: No non-invasive testing performed  Prior CABG: No previous CABG        Verne Carrow

## 2023-01-29 ENCOUNTER — Encounter (HOSPITAL_COMMUNITY): Payer: Self-pay | Admitting: Cardiovascular Disease

## 2023-02-03 ENCOUNTER — Ambulatory Visit: Payer: Medicare HMO | Attending: Internal Medicine

## 2023-02-03 ENCOUNTER — Ambulatory Visit (HOSPITAL_COMMUNITY)
Admission: RE | Admit: 2023-02-03 | Discharge: 2023-02-03 | Disposition: A | Discharge status: Home / Self Care | Payer: Medicare HMO | Source: Ambulatory Visit | Attending: Student in an Organized Health Care Education/Training Program | Admitting: Student in an Organized Health Care Education/Training Program

## 2023-02-03 DIAGNOSIS — I350 Nonrheumatic aortic (valve) stenosis: Secondary | ICD-10-CM | POA: Insufficient documentation

## 2023-02-03 DIAGNOSIS — I517 Cardiomegaly: Secondary | ICD-10-CM | POA: Diagnosis not present

## 2023-02-03 DIAGNOSIS — I4891 Unspecified atrial fibrillation: Secondary | ICD-10-CM | POA: Diagnosis not present

## 2023-02-03 DIAGNOSIS — Z5181 Encounter for therapeutic drug level monitoring: Secondary | ICD-10-CM | POA: Diagnosis not present

## 2023-02-03 DIAGNOSIS — Z01818 Encounter for other preprocedural examination: Secondary | ICD-10-CM | POA: Diagnosis not present

## 2023-02-03 DIAGNOSIS — Z9049 Acquired absence of other specified parts of digestive tract: Secondary | ICD-10-CM | POA: Diagnosis not present

## 2023-02-03 DIAGNOSIS — I700 Atherosclerosis of aorta: Secondary | ICD-10-CM | POA: Diagnosis not present

## 2023-02-03 LAB — POCT INR: INR: 1.4 — AB (ref 2.0–3.0)

## 2023-02-03 MED ORDER — IOHEXOL 350 MG/ML SOLN
100.0000 mL | Freq: Once | INTRAVENOUS | Status: AC | PRN
  Administered 2023-02-03: 100 mL via INTRAVENOUS

## 2023-02-03 NOTE — Patient Instructions (Signed)
Today take 2 tablets then continue 1 tablet daily, except 0.5 tablet on Sunday.  INR in 2 weeks. Coumadin Clinic [phone]

## 2023-02-04 ENCOUNTER — Telehealth: Payer: Self-pay | Admitting: Cardiology

## 2023-02-04 NOTE — Telephone Encounter (Signed)
Pt c/o medication issue:  1. Name of Medication:  furosemide (LASIX) 20 MG tablet  2. How are you currently taking this medication (dosage and times per day)?   3. Are you having a reaction (difficulty breathing--STAT)?   4. What is your medication issue?    Patient states his Lasix was doubled prior to having procedures. Now that procedures are complete, patient would like to confirm it is alright to go back to normal dose. Please advise.

## 2023-02-04 NOTE — Telephone Encounter (Signed)
Patient is aware to go back to regular dose of lasix 40 mg daily

## 2023-02-06 ENCOUNTER — Other Ambulatory Visit: Payer: Self-pay | Admitting: Cardiology

## 2023-02-06 DIAGNOSIS — I25119 Atherosclerotic heart disease of native coronary artery with unspecified angina pectoris: Secondary | ICD-10-CM

## 2023-02-08 NOTE — Progress Notes (Unsigned)
[address]       [place] [address]             [phone]           [name] [facility] Medical Record #[id number] Date of Birth: [birth year]  Richard Red, MD  Chief Complaint:  AS and CAD   History of Present Illness:     Pt is a pleasant 77 yo male with a recent work up for increasing CHF and DOE. Pt has known cardiac history of CAD from cath in 2007 and then Afib that began in 2020 treated with cardioversions and anticoagulation. Pt had a difficult time last year from cholecystitis and complicated with sepsis and bladder rupture but had recovered. He had been having fluid retention and increasing DOE and was treated with lasix and has been doing better. He had a cardiomyopathy in the past with EF 25% and was also using alcohol which has since stopped. He was noted to have LFLG AS with a mean gradient in upper teens in the past but he recent echo had EF 35% with MG of and felt again to represent severe LFLG AS with a valve area of 0.67cm2. He was felt to require AVR and work up with cath revealed a highly calcified ascending aorta and 3 VCAD. Pt had CTA of chest with extensive PAD and extremely calcified ascending aorta on upper 2/3rds of aorta. Pt was sent for surgical evaluation. Pt has no CP or lightheadedness.      Past Medical History:  Diagnosis Date   Allergic rhinitis 08/08/2013   takes Loratadine daily    Aortic stenosis 09/08/2011   With mild aortic regurgitation, mean gradient 13 mmHg    Cataract    right but immature   CHF (congestive heart failure) (HCC) 2007   Complication of anesthesia    stopped breathing - during shoulder surgery 2009-2010   Coronary artery disease 10/02/2009   Cardiac cath (October 2007): 20% left main, 60% mid LAD, 60-70% distal LAD, 30-40% first diagonal, 30% circumflex, 40% OM, 30-40% RCA    Degenerative joint disease of shoulder 08/03/2013   Bilateral, s/p right  total shoulder arthroplasty 05/29/2011 and left shoulder arthroplasty 06/08/2014   Diastolic dysfunction 04/04/2016   Echo (04/04/2016): Grade I   Diverticulosis 10/07/2013   Seen on colonoscopy in 2007    Erectile dysfunction 05/07/2009   Essential hypertension 08/03/2013   had been on Lisinopril but stopped per MD   First degree AV block 03/14/2016   Gastric ulcer 10/07/2013   Seen on EGD 04/10/2006    Hemorrhoids 10/07/2013   Hyperlipidemia 10/02/2009   Paroxysmal atrial fibrillation (HCC) 07/14/2006   One episode, provoked by alcohol use disorder, briefly anticoagulated with warfarin, then switched to aspirin alone   Pre-diabetes    Sciatica associated with disorder of lumbar spine 08/03/2013   Anterolisthesis with right L 4-5 nerve root compression.  Treated with epidural injections and Physical Therapy.    Seborrheic keratosis 10/07/2013   Tubular adenoma of colon     Past Surgical History:  Procedure Laterality Date   BIOPSY  06/02/2022   Procedure: BIOPSY;  Surgeon: Meridee Score Netty Starring., MD;  Location: [facility];  Service: Gastroenterology;;   CARDIAC CATHETERIZATION  2007   CARDIOVERSION     CARDIOVERSION N/A 08/04/2019   Procedure: CARDIOVERSION;  Surgeon: Pricilla Riffle, MD;  Location: [facility];  Service: Cardiovascular;  Laterality: N/A;   CHOLECYSTECTOMY  N/A 04/21/2022   Procedure: LAPAROSCOPIC CHOLECYSTECTOMY;  Surgeon: Diamantina Monks, MD;  Location: [facility] OR;  Service: General;  Laterality: N/A;   COLONOSCOPY     CYSTOSCOPY N/A 01/04/2022   Procedure: Bluford Kaufmann;  Surgeon: Heloise Purpura, MD;  Location: [facility] OR;  Service: Urology;  Laterality: N/A;   ENDOSCOPIC RETROGRADE CHOLANGIOPANCREATOGRAPHY (ERCP) WITH PROPOFOL N/A 06/02/2022   Procedure: ENDOSCOPIC RETROGRADE CHOLANGIOPANCREATOGRAPHY (ERCP) WITH PROPOFOL;  Surgeon: Meridee Score Netty Starring., MD;  Location: [facility]COPY;  Service: Gastroenterology;  Laterality: N/A;   ERCP N/A 04/21/2022   Procedure:  ATTEMPTED ENDOSCOPIC RETROGRADE CHOLANGIOPANCREATOGRAPHY (ERCP);  Surgeon: Iva Boop, MD;  [facility];  Service: Gastroenterology;  Laterality: N/A;   ESOPHAGOGASTRODUODENOSCOPY (EGD) WITH PROPOFOL N/A 06/02/2022   Procedure: ESOPHAGOGASTRODUODENOSCOPY (EGD) WITH PROPOFOL;  Surgeon: Meridee Score Netty Starring., MD;  Location: [facility] ENDOSCOPY;  Service: Gastroenterology;  Laterality: N/A;   ESOPHAGOGASTRODUODENOSCOPY (EGD) WITH PROPOFOL N/A 07/22/2022   Procedure: ESOPHAGOGASTRODUODENOSCOPY (EGD) WITH PROPOFOL;  Surgeon: Iva Boop, MD;  Location: [facility];  Service: Gastroenterology;  Laterality: N/A;   EYE SURGERY Bilateral 2022   bilateral cataracts   HEMOSTASIS CLIP PLACEMENT  06/02/2022   Procedure: HEMOSTASIS CLIP PLACEMENT;  Surgeon: Lemar Lofty., MD;  Location: [facility];  Service: Gastroenterology;;   INSERTION OF MESH N/A 03/11/2019   Procedure: Insertion Of Mesh;  Surgeon: Axel Filler, MD;  Location: [facility] OR;  Service: General;  Laterality: N/A;   INTRAOPERATIVE CHOLANGIOGRAM N/A 04/21/2022   Procedure: INTRAOPERATIVE CHOLANGIOGRAM;  Surgeon: Diamantina Monks, MD;  Location: [facility] OR;  Service: General;  Laterality: N/A;   IR EXCHANGE BILIARY DRAIN  02/14/2022   IR PERC CHOLECYSTOSTOMY  12/29/2021   JOINT REPLACEMENT     KNEE ARTHROSCOPY WITH MENISCAL REPAIR Right 01/30/2011   LAPAROTOMY N/A 01/04/2022   Procedure: EXPLORATORY LAPAROTOMY WITH EXTRAPERITONEAL BLADDER REPAIR;  Surgeon: Heloise Purpura, MD;  Location: [facility] OR;  Service: Urology;  Laterality: N/A;   PANCREATIC STENT PLACEMENT  06/02/2022   Procedure: PANCREATIC STENT PLACEMENT;  Surgeon: Meridee Score Netty Starring., MD;  Location: [facility];  Service: Gastroenterology;;   PANCREATIC STENT PLACEMENT  07/22/2022   Procedure: PANCREATIC STENT REMOVAL;  Surgeon: Iva Boop, MD;  Location: [facility];  Service: Gastroenterology;;   POLYPECTOMY  06/02/2022   Procedure: POLYPECTOMY;  Surgeon:  Lemar Lofty., MD;  Location: [facility];  Service: Gastroenterology;;   right finger surgery     as a child   RIGHT/LEFT HEART CATH AND CORONARY ANGIOGRAPHY N/A 01/28/2023   Procedure: RIGHT/LEFT HEART CATH AND CORONARY ANGIOGRAPHY;  Surgeon: Kathleene Hazel, MD;  Location: [facility];  Service: Cardiovascular;  Laterality: N/A;   SPHINCTEROTOMY  06/02/2022   Procedure: SPHINCTEROTOMY;  Surgeon: Mansouraty, Netty Starring., MD;  Location: [facility];  Service: Gastroenterology;;   TONSILLECTOMY     TOTAL KNEE ARTHROPLASTY Left 07/28/2017   Procedure: LEFT TOTAL KNEE ARTHROPLASTY;  Surgeon: Sheral Apley, MD;  Location: [facility] OR;  Service: Orthopedics;  Laterality: Left;   TOTAL KNEE ARTHROPLASTY Right 07/29/2022   Procedure: TOTAL KNEE ARTHROPLASTY;  Surgeon: Sheral Apley, MD;  Location: [facility] ORS;  Service: Orthopedics;  Laterality: Right;   TOTAL SHOULDER ARTHROPLASTY  05/29/2011   Procedure: TOTAL SHOULDER ARTHROPLASTY;  Surgeon: Vania Rea Supple;  Location: [facility] OR;  Service: Orthopedics;  Laterality: Right;   TOTAL SHOULDER ARTHROPLASTY Left 06/08/2014   DR SUPPLE   TOTAL SHOULDER ARTHROPLASTY Left 06/08/2014   Procedure: LEFT TOTAL SHOULDER ARTHROPLASTY;  Surgeon: Senaida Lange, MD;  Location:  [facility] OR;  Service: Orthopedics;  Laterality: Left;   UMBILICAL HERNIA REPAIR N/A 03/11/2019   Procedure: LAPAROSCOPIC UMBILICAL HERNIA;  Surgeon: Axel Filler, MD;  Location: [facility] OR;  Service: General;  Laterality: N/A;    Social History   Tobacco Use  Smoking Status Former   Current packs/day: 0.00   Average packs/day: 1.5 packs/day for 42.0 years (63.1 ttl pk-yrs)   Types: Cigarettes   Start date: 31   Quit date: 07/14/2010   Years since quitting: 12.5  Smokeless Tobacco Never  Tobacco Comments   quit smoking about 78yrs ago    Social History   Substance and Sexual Activity  Alcohol Use Not Currently   Comment: no alcohol since 07    Social History    Socioeconomic History   Marital status: Married    Spouse name: Marjolin- Margie   Number of children: 2   Years of education: Not on file   Highest education level: Not on file  Occupational History   Occupation: Retired  Tobacco Use   Smoking status: Former    Current packs/day: 0.00    Average packs/day: 1.5 packs/day for 42.0 years (63.1 ttl pk-yrs)    Types: Cigarettes    Start date: 22    Quit date: 07/14/2010    Years since quitting: 12.5   Smokeless tobacco: Never   Tobacco comments:    quit smoking about 36yrs ago  Vaping Use   Vaping status: Never Used  Substance and Sexual Activity   Alcohol use: Not Currently    Comment: no alcohol since 07   Drug use: No   Sexual activity: Never    Birth control/protection: None  Other Topics Concern   Not on file  Social History Narrative   Lives [place] with wife and dog.  Former Contractor.   Social Determinants of Health   Financial Resource Strain: Low Risk  (12/24/2022)   Overall Financial Resource Strain (CARDIA)    Difficulty of Paying Living Expenses: Not hard at all  Food Insecurity: No Food Insecurity (12/24/2022)   Hunger Vital Sign    Worried About Running Out of Food in the Last Year: Never true    Ran Out of Food in the Last Year: Never true  Transportation Needs: No Transportation Needs (12/24/2022)   PRAPARE - Administrator, Civil Service (Medical): No    Lack of Transportation (Non-Medical): No  Physical Activity: Inactive (12/24/2022)   Exercise Vital Sign    Days of Exercise per Week: 0 days    Minutes of Exercise per Session: 0 min  Stress: No Stress Concern Present (12/24/2022)   [employer] of Occupational Health - Occupational Stress Questionnaire    Feeling of Stress : Not at all  Social Connections: Moderately Isolated (12/24/2022)   Social Connection and Isolation Panel [NHANES]    Frequency of Communication with Friends and  Family: Three times a week    Frequency of Social Gatherings with Friends and Family: Never    Attends Religious Services: Never    Database administrator or Organizations: No    Attends Banker Meetings: Never    Marital Status: Married  Catering manager Violence: Not At Risk (12/24/2022)   Humiliation, Afraid, Rape, and Kick questionnaire    Fear of Current or Ex-Partner: No    Emotionally Abused: No    Physically Abused: No    Sexually Abused: No    No Known Allergies  Current Outpatient Medications  Medication Sig Dispense Refill   acetaminophen (TYLENOL) 500 MG tablet Take 2 tablets (1,000 mg total) by mouth 4 (four) times daily. (Patient taking differently: Take 1,000 mg by mouth 2 (two) times daily.) 120 tablet 3   aspirin EC 81 MG tablet Take 1 tablet (81 mg total) by mouth daily. Swallow whole.     atorvastatin (LIPITOR) 40 MG tablet TAKE 1 TABLET EVERY DAY 90 tablet 3   carvedilol (COREG) 6.25 MG tablet TAKE 1 TABLET TWICE DAILY WITH MEALS (NEED MD APPOINTMENT) 180 tablet 3   dapagliflozin propanediol (FARXIGA) 10 MG TABS tablet Take 1 tablet (10 mg total) by mouth daily before breakfast. 28 tablet    docusate sodium (COLACE) 100 MG capsule Take 1 capsule (100 mg total) by mouth 2 (two) times daily. 60 capsule 1   enoxaparin (LOVENOX) 100 MG/ML injection Inject 1 mL (100 mg total) into the skin daily. 10 mL 0   finasteride (PROSCAR) 5 MG tablet Take 1 tablet (5 mg total) by mouth daily. 30 tablet 5   fluticasone (FLONASE) 50 MCG/ACT nasal spray Place 2 sprays into both nostrils daily as needed for allergies or rhinitis. 16 g 0   furosemide (LASIX) 20 MG tablet Take 2 tablets (40 mg total) by mouth daily.     loratadine (CLARITIN) 10 MG tablet Take 10 mg by mouth daily as needed (wheezing).     sacubitril-valsartan (ENTRESTO) 24-26 MG Take 1 tablet by mouth 2 (two) times daily. 28 tablet    senna-docusate (SENOKOT-S) 8.6-50 MG tablet Take 1 tablet by mouth 2 (two)  times daily.     silodosin (RAPAFLO) 8 MG CAPS capsule Take 1 capsule (8 mg total) by mouth daily with breakfast. (Patient taking differently: Take 8 mg by mouth daily as needed (urinary retention).) 30 capsule 3   spironolactone (ALDACTONE) 25 MG tablet Take 0.5 tablets (12.5 mg total) by mouth daily. 45 tablet 3   warfarin (COUMADIN) 2.5 MG tablet Take 1/2 a tablet to 1 tablet by mouth daily as directed by the coumadin clinic (Patient taking differently: Take 2.5 mg daily except Sundays take 1.25 mg daily) 90 tablet 0   No current facility-administered medications for this visit.     Family History  Problem Relation Age of Onset   Bradycardia Mother 84       Requiring pacemeaker placement   Coronary artery disease Father 41       Died of myocardial infarction   Coronary artery disease Brother 82       Died of myocardial infarction   Obesity Daughter    Healthy Son    Colon cancer Neg Hx    Esophageal cancer Neg Hx    Stomach cancer Neg Hx    Colon polyps Neg Hx        Physical Exam: Teeth in acceptable condition Lungs: clear Card:RR with 3/6 sem Ext: evidence of vericosities Neuro: intact     Diagnostic Studies & Laboratory data: I have personally reviewed the following studies and agree with the findings   TTE (12/2022) IMPRESSIONS     1. Left ventricular ejection fraction, by estimation, is 35%. The left  ventricle has moderately decreased function. The left ventricle  demonstrates global hypokinesis. There is mild concentric left ventricular  hypertrophy. Left ventricular diastolic  parameters are indeterminate.   2. Right ventricular systolic function is mildly reduced. The right  ventricular size is mildly enlarged. There is moderately elevated  pulmonary artery systolic pressure. The  estimated right ventricular  systolic pressure is 59.9 mmHg.   3. Left atrial size was severely dilated.   4. Right atrial size was moderately dilated.   5. The mitral valve  is normal in structure. Mild to moderate mitral valve  regurgitation. No evidence of mitral stenosis.   6. Tricuspid valve regurgitation is moderate.   7. The aortic valve is tricuspid. There is severe calcifcation of the  aortic valve. Aortic valve regurgitation is mild. Severe low flow/low  gradient aortic valve stenosis. Aortic valve area, by VTI measures 0.64  cm. Aortic valve mean gradient measures  20.0 mmHg.   8. The inferior vena cava is dilated in size with <50% respiratory  variability, suggesting right atrial pressure of 15 mmHg.   9. A small pericardial effusion is present.  10. The patient was in atrial fibrillation.   FINDINGS   Left Ventricle: Left ventricular ejection fraction, by estimation, is  35%. The left ventricle has moderately decreased function. The left  ventricle demonstrates global hypokinesis. The left ventricular internal  cavity size was normal in size. There is  mild concentric left ventricular hypertrophy. Left ventricular diastolic  parameters are indeterminate.   Right Ventricle: The right ventricular size is mildly enlarged. No  increase in right ventricular wall thickness. Right ventricular systolic  function is mildly reduced. There is moderately elevated pulmonary artery  systolic pressure. The tricuspid  regurgitant velocity is 3.35 m/s, and with an assumed right atrial  pressure of 15 mmHg, the estimated right ventricular systolic pressure is  59.9 mmHg.   Left Atrium: Left atrial size was severely dilated.   Right Atrium: Right atrial size was moderately dilated.   Pericardium: A small pericardial effusion is present.   Mitral Valve: The mitral valve is normal in structure. Mild to moderate  mitral valve regurgitation. No evidence of mitral valve stenosis.   Tricuspid Valve: The tricuspid valve is normal in structure. Tricuspid  valve regurgitation is moderate.   Aortic Valve: The aortic valve is tricuspid. There is severe  calcifcation  of the aortic valve. Aortic valve regurgitation is mild. Aortic  regurgitation PHT measures 451 msec. Severe aortic stenosis is present.  Aortic valve mean gradient measures 20.0  mmHg. Aortic valve peak gradient measures 26.1 mmHg. Aortic valve area, by  VTI measures 0.64 cm.   Pulmonic Valve: The pulmonic valve was normal in structure. Pulmonic valve  regurgitation is trivial.   Aorta: The aortic root is normal in size and structure.   Venous: The inferior vena cava is dilated in size with less than 50%  respiratory variability, suggesting right atrial pressure of 15 mmHg.   IAS/Shunts: No atrial level shunt detected by color flow Doppler.     LEFT VENTRICLE  PLAX 2D  LVIDd:         4.10 cm   Diastology  LVIDs:         3.40 cm   LV e' medial:    5.36 cm/s  LV PW:         1.20 cm   LV E/e' medial:  17.5  LV IVS:        1.20 cm   LV e' lateral:   9.25 cm/s  LVOT diam:     2.10 cm   LV E/e' lateral: 10.1  LV SV:         33  LV SV Index:   19  LVOT Area:     3.46 cm     RIGHT VENTRICLE  IVC  RV Basal diam:  4.90 cm    IVC diam: 2.10 cm  RV S prime:     7.23 cm/s  TAPSE (M-mode): 1.3 cm   LEFT ATRIUM              Index        RIGHT ATRIUM           Index  LA diam:        5.70 cm  3.26 cm/m   RA Area:     24.50 cm  LA Vol (A2C):   115.0 ml 65.70 ml/m  RA Volume:   72.40 ml  41.36 ml/m  LA Vol (A4C):   102.0 ml 58.27 ml/m  LA Biplane Vol: 109.0 ml 62.27 ml/m   AORTIC VALVE  AV Area (Vmax):    0.67 cm  AV Area (Vmean):   0.58 cm  AV Area (VTI):     0.64 cm  AV Vmax:           255.40 cm/s  AV Vmean:          194.200 cm/s  AV VTI:            0.518 m  AV Peak Grad:      26.1 mmHg  AV Mean Grad:      20.0 mmHg  LVOT Vmax:         49.25 cm/s  LVOT Vmean:        32.250 cm/s  LVOT VTI:          0.095 m  LVOT/AV VTI ratio: 0.18  AI PHT:            451 msec    AORTA  Ao Root diam: 3.50 cm  Ao Asc diam:  3.50 cm   MR Peak grad: 101.4 mmHg    TRICUSPID VALVE  MR Mean grad: 73.5 mmHg    TR Peak grad:   44.9 mmHg  MR Vmax:      503.50 cm/s  TR Vmax:        335.00 cm/s  MR Vmean:     409.0 cm/s  MV E velocity: 93.77 cm/s  SHUNTS                             Systemic VTI:  0.10 m                             Systemic Diam: 2.10 cm   CATH (12/2022) Conclusion      Prox RCA to Mid RCA lesion is 70% stenosed.   Dist RCA lesion is 80% stenosed.   RPDA lesion is 99% stenosed.   3rd Mrg lesion is 99% stenosed.   Prox Cx to Mid Cx lesion is 50% stenosed.   Ost LAD to Prox LAD lesion is 60% stenosed.   1st Diag lesion is 99% stenosed.   Mid LAD lesion is 99% stenosed.   Mid LAD to Dist LAD lesion is 40% stenosed.   Severe triple vessel CAD Heavily calcified proximal and mid LAD with moderate proximal stenosis and severe mid vessel stenosis. The Moderate caliber diagonal branch has a severe stenosis.  The circumflex has moderate proximal and mid vessel stenosis. The distal obtuse marginal branch is a small caliber vessel with a severe focal stenosis The large, dominant RCA has diffuse, severe, heavily calcified mid and distal vessel stenosis. The PDA  has a severe ostial stenosis Elevated right heart pressures Severe aortic stenosis  (AVA 0.75 cm2. MG 30.9 mmHg)   Recommendations: He has severe three vessel CAD with heavily calcified, diffusely diseased vessels. He also has severe aortic stenosis. If he is felt to be a candidate for surgery, I think he would be best served with CABG/surgical AVR. His aortic arch appears to be calcified on fluoroscopy. I will discharge him home today after bedrest. We will arrange pre TAVR CT scans and have him seen by one of the CT surgeons on our valve team. If he is not felt to be a candidate for surgery, he would require complex PCI with orbital atherectomy of the RCA and LAD with long segments of stenting followed by TAVR.  Given elevated pressures, will have him increase his Lasix to 40 mg po BID for  three days.    Recent Radiology Findings:   CTA (01/2023) FINDINGS: Image quality: Excellent.   Noise artifact is: Limited.   Valve Morphology: Tricuspid aortic valve with diffuse severe calcifications. Acquired fusion of the NCC/LCC. Severely restricted leaflet motion in systole. Incomplete leaflet coaptation consistent with at least moderate AI.   Aortic Valve Calcium score: 2527   Aortic annular dimension:   Phase assessed: 35%   Annular area: 498 mm2   Annular perimeter: 80.5 mm   Max diameter: 28.7 mm   Min diameter: 23.3 mm   Annular and subannular calcification: Minimal calcium under the NCC.   Membranous septum length: 6.9 mm   Optimal coplanar projection: LAO 20 CRA 13   Coronary Artery Height above Annulus:   Left Main: 17.1 mm   Right Coronary: 14.4 mm   Sinus of Valsalva Measurements:   Non-coronary: 36 mm   Right-coronary: 35 mm   Left-coronary: 37 mm   Sinus of Valsalva Height:   Non-coronary: 28.0 mm   Right-coronary: 22.1 mm   Left-coronary: 24.0 mm   Sinotubular Junction: 30 mm   Ascending Thoracic Aorta: 35 mm circumferential calcifications noted.   Coronary Arteries: Normal coronary origin. Right dominance. The study was performed without use of NTG and is insufficient for plaque evaluation. Please refer to recent cardiac catheterization for coronary assessment. Severe 3-vessel calcifications.   Cardiac Morphology:   Right Atrium: Right atrial size is dilated.   Right Ventricle: The right ventricular cavity is within normal limits.   Left Atrium: Left atrial size is dilated. LAA filling defect noted which is concerning for thrombus.   Left Ventricle: The ventricular cavity size is within normal limits.   Pulmonary arteries: Dilated pulmonary artery suggestive of pulmonary hypertension.   Pulmonary veins: Normal pulmonary venous drainage.   Pericardium: Normal thickness with no significant effusion or calcium  present.   Mitral Valve: The mitral valve is normal structure without significant calcification.   Extra-cardiac findings: Moderate right pleural effusion. Small left pleural effusion. See attached radiology report for non-cardiac structures.   IMPRESSION: 1. Tricuspid aortic valve with severe calcifications and acquired fusion of the NCC/LCC.   2. Annular measurements support a 26 mm S3 or 29 mm Evolut Pro.   3. Minimal annular calcium under the NCC.   4. Sufficient coronary to annulus distance.   5. Optimal Fluoroscopic Angle for Delivery: LAO 20 CRA 13   6. Circumferential calcifications noted in the ascending aorta.   7. LAA filling defect consistent with thrombus.   8. Dilated pulmonary artery suggestive of pulmonary hypertension.   9. Moderate right pleural effusion and small left pleural effusion.  Recent Lab Findings: Lab Results  Component Value Date   WBC 6.8 01/26/2023   HGB 10.2 (L) 01/28/2023   HGB 11.2 (L) 01/28/2023   HCT 30.0 (L) 01/28/2023   HCT 33.0 (L) 01/28/2023   PLT 124 (L) 01/26/2023   GLUCOSE 73 01/26/2023   CHOL 88 (L) 01/26/2023   TRIG 67 01/26/2023   HDL 33 (L) 01/26/2023   LDLDIRECT 89.6 03/03/2007   LDLCALC 40 01/26/2023   ALT 9 01/26/2023   AST 31 01/26/2023   NA 142 01/28/2023   NA 142 01/28/2023   K 2.6 (LL) 01/28/2023   K 3.1 (L) 01/28/2023   CL 104 01/26/2023   CREATININE 0.94 01/26/2023   BUN 25 01/26/2023   CO2 25 01/26/2023   TSH 2.624 03/22/2019   INR 1.4 (A) 02/03/2023   HGBA1C 5.8 (H) 07/21/2022      Assessment / Plan:     77 yo male with NYHA class 2 symptoms of severe LFLG AS with depressed LV function and complicated by severe ascending aortic calcification. I would not feel that surgical AVR should be considered with his level of porcelain aorta unless no other options available. He is at extremely high risk for surgical complications of bleeding and CVA. His CAD I dont believe precludes a rapid run of  tachycardia to deploy valve and his Left femoral accessible with at most shock wave. His Right carotid also an option. I discussed all of the is with his son and son in law and they understand and will await the TAVR team recommendation after discussion tomorrow. He would not be a bail out candidate for TAVR   I have spent 60 min in review of the records, viewing studies and in face to face with patient and in coordination of future care    Eugenio Hoes 02/08/2023 11:34 AM

## 2023-02-09 ENCOUNTER — Encounter: Payer: Self-pay | Admitting: Thoracic Surgery (Cardiothoracic Vascular Surgery)

## 2023-02-09 ENCOUNTER — Institutional Professional Consult (permissible substitution): Payer: Medicare HMO | Admitting: Thoracic Surgery (Cardiothoracic Vascular Surgery)

## 2023-02-09 VITALS — BP 109/65 | HR 67 | Resp 20 | Ht 66.0 in | Wt 147.0 lb

## 2023-02-09 DIAGNOSIS — I350 Nonrheumatic aortic (valve) stenosis: Secondary | ICD-10-CM

## 2023-02-09 DIAGNOSIS — I2510 Atherosclerotic heart disease of native coronary artery without angina pectoris: Secondary | ICD-10-CM

## 2023-02-09 NOTE — Progress Notes (Unsigned)
Procedure Type: Isolated AVR  PERIOPERATIVE OUTCOME ESTIMATE %  Operative Mortality 4.46%  Morbidity & Mortality 18.4%  Stroke 0.881%  Renal Failure 3.12%  Reoperation 8.01%  Prolonged Ventilation 14.1%  Deep Sternal Wound Infection 0.08%  Long Hospital Stay (>14 days) 11.2%  Short Hospital Stay (<6 days)* 27%

## 2023-02-09 NOTE — Patient Instructions (Signed)
TAVR discussion

## 2023-02-11 ENCOUNTER — Telehealth: Payer: Self-pay

## 2023-02-11 NOTE — Telephone Encounter (Signed)
Iona Coach, RN  [facility],  Mr Euresti is coming in for a coumadin visit on 8/19.  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 Please assist with coordinating lovenox bridge.  Thank you, Lauren  Received message above. Note placed on upcoming coumadin clinic appt to make RN aware of procedure on 03/03/23 for Lovenox Bridge.

## 2023-02-12 ENCOUNTER — Other Ambulatory Visit: Payer: Self-pay | Admitting: Cardiology

## 2023-02-12 ENCOUNTER — Telehealth: Payer: Self-pay | Admitting: *Deleted

## 2023-02-12 DIAGNOSIS — I4891 Unspecified atrial fibrillation: Secondary | ICD-10-CM

## 2023-02-12 DIAGNOSIS — I5021 Acute systolic (congestive) heart failure: Secondary | ICD-10-CM

## 2023-02-12 DIAGNOSIS — Z5181 Encounter for therapeutic drug level monitoring: Secondary | ICD-10-CM

## 2023-02-12 MED ORDER — DAPAGLIFLOZIN PROPANEDIOL 10 MG PO TABS
10.0000 mg | ORAL_TABLET | Freq: Every day | ORAL | Status: DC
Start: 2023-02-12 — End: 2023-03-17

## 2023-02-12 MED ORDER — FUROSEMIDE 20 MG PO TABS
ORAL_TABLET | ORAL | Status: AC
Start: 2023-02-12 — End: ?

## 2023-02-12 NOTE — Telephone Encounter (Signed)
-----   Message from Nurse Leotis Shames B sent at 02/11/2023 12:20 PM EDT ----- Regarding: [name],   I just spoke with Mr Snell and he is coming into the office on Monday for a coumadin visit.  He was started on Farxiga and initally given samples.  He said this medicine will cost him $95 per month and he cannot afford long term.  He asked me to send you a message to see if you can get additional samples.  It doesn't sound like this will be something that he can continue taking due to cost.   Thanks, Leotis Shames

## 2023-02-12 NOTE — Telephone Encounter (Signed)
Spoke with pt, aware samples and patient assistance application placed in [facility]. Patient reports his blood pressure was doing good until today, this morning 99/61 and then 91/47 and he did have some dizziness. He has drank more water and reports he feels better. He is currently taking furosemide 40 mg once daily. His weight is down to 137 lb from 152 lb and he has no swelling. He wonders how long he soul take the 40 mg of the lasix. Will forward for dr [name] review

## 2023-02-12 NOTE — Telephone Encounter (Signed)
Spoke with pt, Aware of dr crenshaw's recommendations.  °

## 2023-02-12 NOTE — Telephone Encounter (Signed)
Refill request for warfarin:  Last INR was 1.4 on 02/03/23 Next INR due 02/16/23 LOV was 01/22/23  Refill approved.

## 2023-02-16 ENCOUNTER — Ambulatory Visit: Payer: Medicare HMO | Admitting: *Deleted

## 2023-02-16 ENCOUNTER — Telehealth: Payer: Self-pay | Admitting: Cardiology

## 2023-02-16 ENCOUNTER — Other Ambulatory Visit: Payer: Self-pay

## 2023-02-16 DIAGNOSIS — I4891 Unspecified atrial fibrillation: Secondary | ICD-10-CM

## 2023-02-16 DIAGNOSIS — Z5181 Encounter for therapeutic drug level monitoring: Secondary | ICD-10-CM | POA: Diagnosis not present

## 2023-02-16 LAB — POCT INR: INR: 1.4 — AB (ref 2.0–3.0)

## 2023-02-16 NOTE — Telephone Encounter (Signed)
Spoke with Isabella Bowens pharmacist for [facility] that patient was tos top losartan 50 mg  on 01/22/23 and start entresto 24/26

## 2023-02-16 NOTE — Telephone Encounter (Signed)
Pt c/o medication issue:  1. Name of Medication: Losartan 50 MG  2. How are you currently taking this medication (dosage and times per day)?    3. Are you having a reaction (difficulty breathing--STAT)? No  4. What is your medication issue? Medication is showing as D/C'd and pharmacy is requesting a D/C order to be sent for their records. Please advise

## 2023-02-16 NOTE — Patient Instructions (Addendum)
Description   Today take 2 tablets and tomorrow take 1.5 tablets then START taking 1 tablet daily except 1.5 tablets on Fridays. Recheck INR in 1 week-needs bridge for TAVR. [facility] [phone]

## 2023-02-20 ENCOUNTER — Telehealth: Payer: Self-pay | Admitting: Cardiology

## 2023-02-20 NOTE — Telephone Encounter (Signed)
Pt would like a callback as to what time he needs to arrive for schedule Cath on 9/3. Please advise

## 2023-02-20 NOTE — Telephone Encounter (Signed)
Returned call to pt. LVM that he will be getting a call from the Cath Lab and surgery nurses to go over all his instructions prior to his procedure.

## 2023-02-23 ENCOUNTER — Ambulatory Visit: Payer: Medicare HMO | Attending: Cardiology

## 2023-02-23 DIAGNOSIS — I4891 Unspecified atrial fibrillation: Secondary | ICD-10-CM

## 2023-02-23 DIAGNOSIS — Z5181 Encounter for therapeutic drug level monitoring: Secondary | ICD-10-CM

## 2023-02-23 LAB — POCT INR: INR: 1.5 — AB (ref 2.0–3.0)

## 2023-02-23 MED ORDER — ENOXAPARIN SODIUM 100 MG/ML IJ SOSY
100.0000 mg | PREFILLED_SYRINGE | INTRAMUSCULAR | 1 refills | Status: DC
Start: 2023-02-23 — End: 2023-03-11

## 2023-02-23 NOTE — Patient Instructions (Addendum)
Description   Take 2 tablets today and then START taking 1 tablet daily except 1.5 tablets on Mondays, Wednesdays, and Fridays.  On 02/25/23 follow pre/post procedure instructions.  Recheck INR in 1 week post procedure. [facility] [phone]   8/28: Last dose of warfarin.  8/29: No warfarin or enoxaparin (Lovenox).  8/30: Inject enoxaparin 100mg  in the fatty abdominal tissue at least 2 inches from the belly button every 24 hours at 8pm. No warfarin.  8/31: Inject enoxaparin in the fatty tissue eat 8pm. No warfarin.  9/1: Inject enoxaparin in the fatty tissue at 8pm. No warfarin.  9/2: No enoxaparin. No warfarin.  9/3: Procedure Day - No enoxaparin - Resume warfarin in the evening or as directed by doctor.  9/4: Resume enoxaparin inject in the fatty tissue at 8am and take warfarin  9/5: Inject enoxaparin in the fatty tissue at 8am and take warfarin  9/6: Inject enoxaparin in the fatty tissue at 8am and take warfarin  9/7: Inject enoxaparin in the fatty tissue at 8am and take warfarin  9/8: Inject enoxaparin in the fatty tissue at 8am and take warfarin  9/9: Inject enoxaparin in the fatty tissue at 8am and warfarin appt to check INR.

## 2023-02-24 ENCOUNTER — Other Ambulatory Visit: Payer: Self-pay

## 2023-02-24 DIAGNOSIS — I350 Nonrheumatic aortic (valve) stenosis: Secondary | ICD-10-CM

## 2023-02-26 ENCOUNTER — Encounter: Payer: Self-pay | Admitting: Cardiovascular Disease

## 2023-02-26 ENCOUNTER — Ambulatory Visit: Payer: Medicare HMO | Attending: Cardiovascular Disease | Admitting: Cardiovascular Disease

## 2023-02-26 VITALS — BP 130/68 | HR 62 | Resp 96 | Ht 66.0 in | Wt 141.0 lb

## 2023-02-26 DIAGNOSIS — I350 Nonrheumatic aortic (valve) stenosis: Secondary | ICD-10-CM

## 2023-02-26 NOTE — Patient Instructions (Signed)
Medication Instructions:  Your physician recommends that you continue on your current medications as directed. Please refer to the Current Medication list given to you today.  *If you need a refill on your cardiac medications before your next appointment, please call your pharmacy*  Follow-Up: At [facility], you and your health needs are our priority.  As part of our continuing mission to provide you with exceptional heart care, we have created designated Provider Care Teams.  These Care Teams include your primary Cardiologist (physician) and Advanced Practice Providers (APPs -  Physician Assistants and Nurse Practitioners) who all work together to provide you with the care you need, when you need it.  Your next appointment:   Per structural team  Provider:   Dr Clifton James

## 2023-02-26 NOTE — Progress Notes (Signed)
Pre Surgical Assessment: 5 M Walk Test  70M=16.42ft  5 Meter Walk Test- trial 1: 6.99 seconds 5 Meter Walk Test- trial 2: 6.74 seconds 5 Meter Walk Test- trial 3: 5.85 seconds 5 Meter Walk Test Average: 6.52 seconds

## 2023-02-26 NOTE — Progress Notes (Signed)
[facility] Consult Note  Chief Complaint  Patient presents with   Follow-up    Aortic stenosis   History of Present Illness:77 yo male with history of CAD, HTN, hyperlipidemia, paroxysmal atrial fibrillation, cardiomyopathy and severe low flow/low gradient aortic stenosis who is here today to discuss possible TAVR. Echo July 2024 with LVEF=35% with global hypokinesis. Mildly reduced RV function. Mild to moderate mitral valve regurgitation. Severe low flow/low gradient aortic stenosis with AVA 0.64, mean gradient 20 mmHg, SVI 19, dimensionless index 0.18. Cardiac cath 01/28/23 with three vessel CAD. We discussed his echo and cath findings in our Structural Heat Meeting and elected to treat his CAD conservatively for now and proceed with TAVR. Cardiac CT with findings suitable for a 26 mm Edwards Sapien 3 valve. He has marginal femoral access but his left side appears to be a viable option.   He tells me today that he has had a few dizzy spells. No syncope. No chest pain or dyspnea. He lives in [place], [place] with his wife. He is retired from [employer]	[place]. He has full dentures.   Primary Care Physician: Tyson Alias, MD Primary Cardiologist: [name] Referring Cardiologist: [name]  Past Medical History:  Diagnosis Date   Allergic rhinitis 08/08/2013   takes Loratadine daily    Aortic stenosis 09/08/2011   With mild aortic regurgitation, mean gradient 13 mmHg    Cataract    right but immature   CHF (congestive heart failure) (HCC) 2007   Complication of anesthesia    stopped breathing - during shoulder surgery 2009-2010   Coronary artery disease 10/02/2009   Cardiac cath (October 2007): 20% left main, 60% mid LAD, 60-70% distal LAD, 30-40% first diagonal, 30% circumflex, 40% OM, 30-40% RCA    Degenerative joint disease of shoulder 08/03/2013   Bilateral, s/p right total shoulder arthroplasty 05/29/2011 and left shoulder arthroplasty 06/08/2014    Diastolic dysfunction 04/04/2016   Echo (04/04/2016): Grade I   Diverticulosis 10/07/2013   Seen on colonoscopy in 2007    Erectile dysfunction 05/07/2009   Essential hypertension 08/03/2013   had been on Lisinopril but stopped per MD   First degree AV block 03/14/2016   Gastric ulcer 10/07/2013   Seen on EGD 04/10/2006    Hemorrhoids 10/07/2013   Hyperlipidemia 10/02/2009   Paroxysmal atrial fibrillation (HCC) 07/14/2006   One episode, provoked by alcohol use disorder, briefly anticoagulated with warfarin, then switched to aspirin alone   Pre-diabetes    Sciatica associated with disorder of lumbar spine 08/03/2013   Anterolisthesis with right L 4-5 nerve root compression.  Treated with epidural injections and Physical Therapy.    Seborrheic keratosis 10/07/2013   Tubular adenoma of colon     Past Surgical History:  Procedure Laterality Date   BIOPSY  06/02/2022   Procedure: BIOPSY;  Surgeon: Meridee Score Netty Starring., MD;  Location: [facility];  Service: Gastroenterology;;   CARDIAC CATHETERIZATION  2007   CARDIOVERSION     CARDIOVERSION N/A 08/04/2019   Procedure: CARDIOVERSION;  Surgeon: Pricilla Riffle, MD;  Location: [facility];  Service: Cardiovascular;  Laterality: N/A;   CHOLECYSTECTOMY N/A 04/21/2022   Procedure: LAPAROSCOPIC CHOLECYSTECTOMY;  Surgeon: Diamantina Monks, MD;  Location: [facility] OR;  Service: General;  Laterality: N/A;   COLONOSCOPY     CYSTOSCOPY N/A 01/04/2022   Procedure: Bluford Kaufmann;  Surgeon: Heloise Purpura, MD;  Location: [facility] OR;  Service: Urology;  Laterality: N/A;   ENDOSCOPIC RETROGRADE CHOLANGIOPANCREATOGRAPHY (ERCP) WITH PROPOFOL N/A 06/02/2022  Procedure: ENDOSCOPIC RETROGRADE CHOLANGIOPANCREATOGRAPHY (ERCP) WITH PROPOFOL;  Surgeon: Meridee Score Netty Starring., MD;  Location: [facility];  Service: Gastroenterology;  Laterality: N/A;   ERCP N/A 04/21/2022   Procedure: ATTEMPTED ENDOSCOPIC RETROGRADE CHOLANGIOPANCREATOGRAPHY (ERCP);  Surgeon: Iva Boop, MD;  Location: [facility] OR;  Service: Gastroenterology;  Laterality: N/A;   ESOPHAGOGASTRODUODENOSCOPY (EGD) WITH PROPOFOL N/A 06/02/2022   Procedure: ESOPHAGOGASTRODUODENOSCOPY (EGD) WITH PROPOFOL;  Surgeon: Meridee Score Netty Starring., MD;  Location: [facility]COPY;  Service: Gastroenterology;  Laterality: N/A;   ESOPHAGOGASTRODUODENOSCOPY (EGD) WITH PROPOFOL N/A 07/22/2022   Procedure: ESOPHAGOGASTRODUODENOSCOPY (EGD) WITH PROPOFOL;  Surgeon: Iva Boop, MD;  Location: [facility];  Service: Gastroenterology;  Laterality: N/A;   EYE SURGERY Bilateral 2022   bilateral cataracts   HEMOSTASIS CLIP PLACEMENT  06/02/2022   Procedure: HEMOSTASIS CLIP PLACEMENT;  Surgeon: Lemar Lofty., MD;  Location: [facility];  Service: Gastroenterology;;   INSERTION OF MESH N/A 03/11/2019   Procedure: Insertion Of Mesh;  Surgeon: Axel Filler, MD;  Location: [facility] OR;  Service: General;  Laterality: N/A;   INTRAOPERATIVE CHOLANGIOGRAM N/A 04/21/2022   Procedure: INTRAOPERATIVE CHOLANGIOGRAM;  Surgeon: Diamantina Monks, MD;  Location: [facility] OR;  Service: General;  Laterality: N/A;   IR EXCHANGE BILIARY DRAIN  02/14/2022   IR PERC CHOLECYSTOSTOMY  12/29/2021   JOINT REPLACEMENT     KNEE ARTHROSCOPY WITH MENISCAL REPAIR Right 01/30/2011   LAPAROTOMY N/A 01/04/2022   Procedure: EXPLORATORY LAPAROTOMY WITH EXTRAPERITONEAL BLADDER REPAIR;  Surgeon: Heloise Purpura, MD;  Location: [facility] OR;  Service: Urology;  Laterality: N/A;   PANCREATIC STENT PLACEMENT  06/02/2022   Procedure: PANCREATIC STENT PLACEMENT;  Surgeon: Meridee Score Netty Starring., MD;  Location: [facility];  Service: Gastroenterology;;   PANCREATIC STENT PLACEMENT  07/22/2022   Procedure: PANCREATIC STENT REMOVAL;  Surgeon: Iva Boop, MD;  Location: [facility];  Service: Gastroenterology;;   POLYPECTOMY  06/02/2022   Procedure: POLYPECTOMY;  Surgeon: Lemar Lofty., MD;  Location: [facility];  Service: Gastroenterology;;   right finger  surgery     as a child   RIGHT/LEFT HEART CATH AND CORONARY ANGIOGRAPHY N/A 01/28/2023   Procedure: RIGHT/LEFT HEART CATH AND CORONARY ANGIOGRAPHY;  Surgeon: Kathleene Hazel, MD;  Location: [facility];  Service: Cardiovascular;  Laterality: N/A;   SPHINCTEROTOMY  06/02/2022   Procedure: SPHINCTEROTOMY;  Surgeon: Mansouraty, Netty Starring., MD;  Location: [facility];  Service: Gastroenterology;;   TONSILLECTOMY     TOTAL KNEE ARTHROPLASTY Left 07/28/2017   Procedure: LEFT TOTAL KNEE ARTHROPLASTY;  Surgeon: Sheral Apley, MD;  Location: [facility] OR;  Service: Orthopedics;  Laterality: Left;   TOTAL KNEE ARTHROPLASTY Right 07/29/2022   Procedure: TOTAL KNEE ARTHROPLASTY;  Surgeon: Sheral Apley, MD;  Location: [facility] ORS;  Service: Orthopedics;  Laterality: Right;   TOTAL SHOULDER ARTHROPLASTY  05/29/2011   Procedure: TOTAL SHOULDER ARTHROPLASTY;  Surgeon: Vania Rea Supple;  Location: [facility] OR;  Service: Orthopedics;  Laterality: Right;   TOTAL SHOULDER ARTHROPLASTY Left 06/08/2014   DR SUPPLE   TOTAL SHOULDER ARTHROPLASTY Left 06/08/2014   Procedure: LEFT TOTAL SHOULDER ARTHROPLASTY;  Surgeon: Senaida Lange, MD;  Location: [facility] OR;  Service: Orthopedics;  Laterality: Left;   UMBILICAL HERNIA REPAIR N/A 03/11/2019   Procedure: LAPAROSCOPIC UMBILICAL HERNIA;  Surgeon: Axel Filler, MD;  Location: [facility] OR;  Service: General;  Laterality: N/A;    Current Outpatient Medications  Medication Sig Dispense Refill   acetaminophen (TYLENOL) 500 MG tablet Take 2 tablets (1,000 mg total) by mouth 4 (four)  times daily. (Patient taking differently: Take 1,000 mg by mouth 2 (two) times daily.) 120 tablet 3   aspirin EC 81 MG tablet Take 1 tablet (81 mg total) by mouth daily. Swallow whole.     atorvastatin (LIPITOR) 40 MG tablet TAKE 1 TABLET EVERY DAY 90 tablet 3   carvedilol (COREG) 6.25 MG tablet TAKE 1 TABLET TWICE DAILY WITH MEALS (NEED MD APPOINTMENT) 180 tablet 3   dapagliflozin propanediol  (FARXIGA) 10 MG TABS tablet Take 1 tablet (10 mg total) by mouth daily before breakfast. 21 tablet    docusate sodium (COLACE) 100 MG capsule Take 1 capsule (100 mg total) by mouth 2 (two) times daily. 60 capsule 1   enoxaparin (LOVENOX) 100 MG/ML injection Inject 1 mL (100 mg total) into the skin daily. 10 mL 0   enoxaparin (LOVENOX) 100 MG/ML injection Inject 1 mL (100 mg total) into the skin daily. 10 mL 1   finasteride (PROSCAR) 5 MG tablet Take 1 tablet (5 mg total) by mouth daily. 30 tablet 5   fluticasone (FLONASE) 50 MCG/ACT nasal spray Place 2 sprays into both nostrils daily as needed for allergies or rhinitis. 16 g 0   furosemide (LASIX) 20 MG tablet 1 tablet daily as needed for weight gain of 3 lbs in one day     loratadine (CLARITIN) 10 MG tablet Take 10 mg by mouth daily as needed (wheezing).     sacubitril-valsartan (ENTRESTO) 24-26 MG Take 1 tablet by mouth 2 (two) times daily. 28 tablet    senna-docusate (SENOKOT-S) 8.6-50 MG tablet Take 1 tablet by mouth 2 (two) times daily.     silodosin (RAPAFLO) 8 MG CAPS capsule Take 1 capsule (8 mg total) by mouth daily with breakfast. (Patient taking differently: Take 8 mg by mouth daily as needed (urinary retention).) 30 capsule 3   spironolactone (ALDACTONE) 25 MG tablet Take 0.5 tablets (12.5 mg total) by mouth daily. 45 tablet 3   warfarin (COUMADIN) 2.5 MG tablet TAKE 1/2 TO 1 TABLET DAILY AS DIRECTED BY THE COUMADIN CLINIC 90 tablet 3   No current facility-administered medications for this visit.    No Known Allergies  Social History   Socioeconomic History   Marital status: Married    Spouse name: Marjolin- Margie   Number of children: 2   Years of education: Not on file   Highest education level: Not on file  Occupational History   Occupation: Retired  Tobacco Use   Smoking status: Former    Current packs/day: 0.00    Average packs/day: 1.5 packs/day for 42.0 years (63.1 ttl pk-yrs)    Types: Cigarettes    Start date:  1    Quit date: 07/14/2010    Years since quitting: 12.6   Smokeless tobacco: Never   Tobacco comments:    quit smoking about 28yrs ago  Vaping Use   Vaping status: Never Used  Substance and Sexual Activity   Alcohol use: Not Currently    Comment: no alcohol since 07   Drug use: No   Sexual activity: Never    Birth control/protection: None  Other Topics Concern   Not on file  Social History Narrative   Lives [place] with wife and dog.  Former Contractor.   Social Determinants of Health   Financial Resource Strain: Low Risk  (12/24/2022)   Overall Financial Resource Strain (CARDIA)    Difficulty of Paying Living Expenses: Not hard at all  Food Insecurity: No  Food Insecurity (12/24/2022)   Hunger Vital Sign    Worried About Running Out of Food in the Last Year: Never true    Ran Out of Food in the Last Year: Never true  Transportation Needs: No Transportation Needs (12/24/2022)   PRAPARE - Administrator, Civil Service (Medical): No    Lack of Transportation (Non-Medical): No  Physical Activity: Inactive (12/24/2022)   Exercise Vital Sign    Days of Exercise per Week: 0 days    Minutes of Exercise per Session: 0 min  Stress: No Stress Concern Present (12/24/2022)   [employer] of Occupational Health - Occupational Stress Questionnaire    Feeling of Stress : Not at all  Social Connections: Moderately Isolated (12/24/2022)   Social Connection and Isolation Panel [NHANES]    Frequency of Communication with Friends and Family: Three times a week    Frequency of Social Gatherings with Friends and Family: Never    Attends Religious Services: Never    Database administrator or Organizations: No    Attends Banker Meetings: Never    Marital Status: Married  Catering manager Violence: Not At Risk (12/24/2022)   Humiliation, Afraid, Rape, and Kick questionnaire    Fear of Current or Ex-Partner: No     Emotionally Abused: No    Physically Abused: No    Sexually Abused: No    Family History  Problem Relation Age of Onset   Bradycardia Mother 47       Requiring pacemeaker placement   Coronary artery disease Father 65       Died of myocardial infarction   Coronary artery disease Brother 30       Died of myocardial infarction   Obesity Daughter    Healthy Son    Colon cancer Neg Hx    Esophageal cancer Neg Hx    Stomach cancer Neg Hx    Colon polyps Neg Hx     Review of Systems:  As stated in the HPI and otherwise negative.   BP 130/68   Pulse 62   Resp (!) 96   Ht 5\' 6"  (1.676 m)   Wt 64 kg   BMI 22.76 kg/m   Physical Examination: General: Well developed, well nourished, NAD  HEENT: OP clear, mucus membranes moist  SKIN: warm, dry. No rashes. Neuro: No focal deficits  Musculoskeletal: Muscle strength 5/5 all ext  Psychiatric: Mood and affect normal  Neck: No JVD, no carotid bruits, no thyromegaly, no lymphadenopathy.  Lungs:Clear bilaterally, no wheezes, rhonci, crackles Cardiovascular: Regular rate and rhythm.  Loud, harsh, late peaking systolic murmur.  Abdomen:Soft. Bowel sounds present. Non-tender.  Extremities: No lower extremity edema. Pulses are 2 + in the bilateral DP/PT.  EKG:  EKG is not ordered today. The ekg ordered today demonstrates   Echo 01/12/23:  1. Left ventricular ejection fraction, by estimation, is 35%. The left  ventricle has moderately decreased function. The left ventricle  demonstrates global hypokinesis. There is mild concentric left ventricular  hypertrophy. Left ventricular diastolic  parameters are indeterminate.   2. Right ventricular systolic function is mildly reduced. The right  ventricular size is mildly enlarged. There is moderately elevated  pulmonary artery systolic pressure. The estimated right ventricular  systolic pressure is 59.9 mmHg.   3. Left atrial size was severely dilated.   4. Right atrial size was moderately  dilated.   5. The mitral valve is normal in structure. Mild to moderate mitral valve  regurgitation.  No evidence of mitral stenosis.   6. Tricuspid valve regurgitation is moderate.   7. The aortic valve is tricuspid. There is severe calcifcation of the  aortic valve. Aortic valve regurgitation is mild. Severe low flow/low  gradient aortic valve stenosis. Aortic valve area, by VTI measures 0.64  cm. Aortic valve mean gradient measures  20.0 mmHg.   8. The inferior vena cava is dilated in size with <50% respiratory  variability, suggesting right atrial pressure of 15 mmHg.   9. A small pericardial effusion is present.  10. The patient was in atrial fibrillation.   FINDINGS   Left Ventricle: Left ventricular ejection fraction, by estimation, is  35%. The left ventricle has moderately decreased function. The left  ventricle demonstrates global hypokinesis. The left ventricular internal  cavity size was normal in size. There is  mild concentric left ventricular hypertrophy. Left ventricular diastolic  parameters are indeterminate.   Right Ventricle: The right ventricular size is mildly enlarged. No  increase in right ventricular wall thickness. Right ventricular systolic  function is mildly reduced. There is moderately elevated pulmonary artery  systolic pressure. The tricuspid  regurgitant velocity is 3.35 m/s, and with an assumed right atrial  pressure of 15 mmHg, the estimated right ventricular systolic pressure is  59.9 mmHg.   Left Atrium: Left atrial size was severely dilated.   Right Atrium: Right atrial size was moderately dilated.   Pericardium: A small pericardial effusion is present.   Mitral Valve: The mitral valve is normal in structure. Mild to moderate  mitral valve regurgitation. No evidence of mitral valve stenosis.   Tricuspid Valve: The tricuspid valve is normal in structure. Tricuspid  valve regurgitation is moderate.   Aortic Valve: The aortic valve is  tricuspid. There is severe calcifcation  of the aortic valve. Aortic valve regurgitation is mild. Aortic  regurgitation PHT measures 451 msec. Severe aortic stenosis is present.  Aortic valve mean gradient measures 20.0  mmHg. Aortic valve peak gradient measures 26.1 mmHg. Aortic valve area, by  VTI measures 0.64 cm.   Pulmonic Valve: The pulmonic valve was normal in structure. Pulmonic valve  regurgitation is trivial.   Aorta: The aortic root is normal in size and structure.   Venous: The inferior vena cava is dilated in size with less than 50%  respiratory variability, suggesting right atrial pressure of 15 mmHg.   IAS/Shunts: No atrial level shunt detected by color flow Doppler.     LEFT VENTRICLE  PLAX 2D  LVIDd:         4.10 cm   Diastology  LVIDs:         3.40 cm   LV e' medial:    5.36 cm/s  LV PW:         1.20 cm   LV E/e' medial:  17.5  LV IVS:        1.20 cm   LV e' lateral:   9.25 cm/s  LVOT diam:     2.10 cm   LV E/e' lateral: 10.1  LV SV:         33  LV SV Index:   19  LVOT Area:     3.46 cm     RIGHT VENTRICLE            IVC  RV Basal diam:  4.90 cm    IVC diam: 2.10 cm  RV S prime:     7.23 cm/s  TAPSE (M-mode): 1.3 cm   LEFT ATRIUM  Index        RIGHT ATRIUM           Index  LA diam:        5.70 cm  3.26 cm/m   RA Area:     24.50 cm  LA Vol (A2C):   115.0 ml 65.70 ml/m  RA Volume:   72.40 ml  41.36 ml/m  LA Vol (A4C):   102.0 ml 58.27 ml/m  LA Biplane Vol: 109.0 ml 62.27 ml/m   AORTIC VALVE  AV Area (Vmax):    0.67 cm  AV Area (Vmean):   0.58 cm  AV Area (VTI):     0.64 cm  AV Vmax:           255.40 cm/s  AV Vmean:          194.200 cm/s  AV VTI:            0.518 m  AV Peak Grad:      26.1 mmHg  AV Mean Grad:      20.0 mmHg  LVOT Vmax:         49.25 cm/s  LVOT Vmean:        32.250 cm/s  LVOT VTI:          0.095 m  LVOT/AV VTI ratio: 0.18  AI PHT:            451 msec    AORTA  Ao Root diam: 3.50 cm  Ao Asc diam:  3.50 cm    MR Peak grad: 101.4 mmHg   TRICUSPID VALVE  MR Mean grad: 73.5 mmHg    TR Peak grad:   44.9 mmHg  MR Vmax:      503.50 cm/s  TR Vmax:        335.00 cm/s  MR Vmean:     409.0 cm/s  MV E velocity: 93.77 cm/s  SHUNTS                             Systemic VTI:  0.10 m                             Systemic Diam: 2.10 cm   Cardiac cath 01/28/23: Prox RCA to Mid RCA lesion is 70% stenosed.   Dist RCA lesion is 80% stenosed.   RPDA lesion is 99% stenosed.   3rd Mrg lesion is 99% stenosed.   Prox Cx to Mid Cx lesion is 50% stenosed.   Ost LAD to Prox LAD lesion is 60% stenosed.   1st Diag lesion is 99% stenosed.   Mid LAD lesion is 99% stenosed.   Mid LAD to Dist LAD lesion is 40% stenosed.   Severe triple vessel CAD Heavily calcified proximal and mid LAD with moderate proximal stenosis and severe mid vessel stenosis. The Moderate caliber diagonal branch has a severe stenosis.  The circumflex has moderate proximal and mid vessel stenosis. The distal obtuse marginal branch is a small caliber vessel with a severe focal stenosis The large, dominant RCA has diffuse, severe, heavily calcified mid and distal vessel stenosis. The PDA has a severe ostial stenosis Elevated right heart pressures Severe aortic stenosis  (AVA 0.75 cm2. MG 30.9 mmHg)   Recommendations: He has severe three vessel CAD with heavily calcified, diffusely diseased vessels. He also has severe aortic stenosis. If he is felt to be a candidate for surgery, I think  he would be best served with CABG/surgical AVR. His aortic arch appears to be calcified on fluoroscopy. I will discharge him home today after bedrest. We will arrange pre TAVR CT scans and have him seen by one of the CT surgeons on our valve team. If he is not felt to be a candidate for surgery, he would require complex PCI with orbital atherectomy of the RCA and LAD with long segments of stenting followed by TAVR.  Given elevated pressures, will have him increase his Lasix to  40 mg po BID for three days.    Indications  Coronary artery disease involving native coronary artery of native heart without angina pectoris [I25.10 (ICD-10-CM)]  Severe aortic stenosis [I35.0 (ICD-10-CM)]   Procedural Details  Technical Details Indication: Moderate CAD by cath in 2007. Severe aortic stenosis.   Procedure: The risks, benefits, complications, treatment options, and expected outcomes were discussed with the patient. The patient and/or family concurred with the proposed plan, giving informed consent. The patient was sedated with Versed and Fentanyl. The IV catheter in the right antecubital vein was changed for a 5 [place] sheath. Right heart catheterization performed with a balloon tipped catheter. The right wrist was prepped and draped in a sterile fashion. 1% lidocaine was used for local anesthesia. Using the modified Seldinger access technique, a 5 French sheath was placed in the right radial artery using u/s guidance. 3 mg Verapamil was given through the sheath. Weight based IV heparin was given. Standard diagnostic catheters were used to perform selective coronary angiography. I engaged the left main with a 5 Fr EBU 3.5 guiding catheter. The JR4 catheter was used to measure LV pressures. All catheter exchanges were performed over an exchange length guidewire.   The sheath was removed from the right radial artery and a hemostasis band was applied at the arteriotomy site on the right wrist.      Estimated blood loss <50 mL.   During this procedure medications were administered to achieve and maintain moderate conscious sedation while the patient's heart rate, blood pressure, and oxygen saturation were continuously monitored and I was present face-to-face 100% of this time.   Medications (Filter: Administrations occurring from [phone] to 0917 on 01/28/23) fentaNYL (SUBLIMAZE) injection (mcg)  Total dose: 25 mcg Date/Time Rate/Dose/Volume Action   01/28/23 0833 25 mcg Given    midazolam (VERSED) injection (mg)  Total dose: 1 mg Date/Time Rate/Dose/Volume Action   01/28/23 0833 1 mg Given   Heparin (Porcine) in NaCl 1000-0.9 UT/500ML-% SOLN (mL)  Total volume: 1,000 mL Date/Time Rate/Dose/Volume Action   01/28/23 0833 500 mL Given   0833 500 mL Given   lidocaine (PF) (XYLOCAINE) 1 % injection (mL)  Total volume: 6 mL Date/Time Rate/Dose/Volume Action   01/28/23 0840 6 mL Given   Radial Cocktail/Verapamil only (mL)  Total volume: 10 mL Date/Time Rate/Dose/Volume Action   01/28/23 0846 10 mL Given   heparin sodium (porcine) injection (Units)  Total dose: 3,500 Units Date/Time Rate/Dose/Volume Action   01/28/23 0853 3,500 Units Given   iohexol (OMNIPAQUE) 350 MG/ML injection (mL)  Total volume: 72 mL Date/Time Rate/Dose/Volume Action   01/28/23 0914 72 mL Given    Sedation Time  Sedation Time Physician-1: 35 minutes 57 seconds Radiation/Fluoro  Fluoro time: 9.2 (min) DAP: 56213 (mGycm2) Cumulative Air Kerma: 458 (mGy) Complications  Complications documented before study signed (01/28/2023  9:40 AM)   No complications were associated with this study.  Documented by Meryl Crutch, EMT - 01/28/2023  9:22 AM  Coronary Findings  Diagnostic Dominance: Right Left Anterior Descending  Vessel is large.  Ost LAD to Prox LAD lesion is 60% stenosed. The lesion is calcified.  Mid LAD lesion is 99% stenosed. The lesion is calcified.  Mid LAD to Dist LAD lesion is 40% stenosed.    First Diagonal Branch  1st Diag lesion is 99% stenosed.    Left Circumflex  Vessel is large.  Prox Cx to Mid Cx lesion is 50% stenosed.    Third Obtuse Marginal Branch  3rd Mrg lesion is 99% stenosed.    Right Coronary Artery  Vessel is large.  Prox RCA to Mid RCA lesion is 70% stenosed. The lesion is calcified.  Dist RCA lesion is 80% stenosed. The lesion is calcified.    Right Posterior Descending Artery  RPDA lesion is 99% stenosed.    Intervention    No interventions have been documented.   Coronary Diagrams  Diagnostic Dominance: Right  Intervention   Implants   No implant documentation for this case.   Syngo Images   Show images for CARDIAC CATHETERIZATION Images on Long Term Storage   Show images for Brach, Pitzen to Procedure Log  Procedure Log   Link to Procedure Log  Procedure Log    Hemo Data  Flowsheet Row Most Recent Value  Fick Cardiac Output 4.97 L/min  Fick Cardiac Output Index 2.89 (L/min)/BSA  Aortic Mean Gradient 30.91 mmHg  Aortic Peak Gradient 28.4 mmHg  Aortic Valve Area 0.75  Aortic Value Area Index 0.44 cm2/BSA  RA A Wave 13 mmHg  RA V Wave 13 mmHg  RA Mean 11 mmHg  RV Systolic Pressure 61 mmHg  RV Diastolic Pressure 3 mmHg  RV EDP 18 mmHg  PA Systolic Pressure 63 mmHg  PA Diastolic Pressure 26 mmHg  PA Mean 39 mmHg  PW A Wave 30 mmHg  PW V Wave 28 mmHg  PW Mean 28 mmHg  AO Systolic Pressure 106 mmHg  AO Diastolic Pressure 44 mmHg  AO Mean 74 mmHg  LV Systolic Pressure 152 mmHg  LV Diastolic Pressure 10 mmHg  LV EDP 15 mmHg  AOp Systolic Pressure 120 mmHg  AOp Diastolic Pressure 51 mmHg  AOp Mean Pressure 77 mmHg  LVp Systolic Pressure 147 mmHg  LVp Diastolic Pressure 13 mmHg  LVp EDP Pressure 17 mmHg  QP/QS 1  TPVR Index 13.51 HRUI  TSVR Index 25.63 HRUI  PVR SVR Ratio 0.17  TPVR/TSVR Ratio 0.53   Cardiac CT 02/03/23: Valve Morphology: Tricuspid aortic valve with diffuse severe calcifications. Acquired fusion of the NCC/LCC. Severely restricted leaflet motion in systole. Incomplete leaflet coaptation consistent with at least moderate AI.   Aortic Valve Calcium score: 2527   Aortic annular dimension:   Phase assessed: 35%   Annular area: 498 mm2   Annular perimeter: 80.5 mm   Max diameter: 28.7 mm   Min diameter: 23.3 mm   Annular and subannular calcification: Minimal calcium under the NCC.   Membranous septum length: 6.9 mm   Optimal coplanar  projection: LAO 20 CRA 13   Coronary Artery Height above Annulus:   Left Main: 17.1 mm   Right Coronary: 14.4 mm   Sinus of Valsalva Measurements:   Non-coronary: 36 mm   Right-coronary: 35 mm   Left-coronary: 37 mm   Sinus of Valsalva Height:   Non-coronary: 28.0 mm   Right-coronary: 22.1 mm   Left-coronary: 24.0 mm   Sinotubular Junction: 30 mm   Ascending Thoracic Aorta: 35 mm  circumferential calcifications noted.   Coronary Arteries: Normal coronary origin. Right dominance. The study was performed without use of NTG and is insufficient for plaque evaluation. Please refer to recent cardiac catheterization for coronary assessment. Severe 3-vessel calcifications.   Cardiac Morphology:   Right Atrium: Right atrial size is dilated.   Right Ventricle: The right ventricular cavity is within normal limits.   Left Atrium: Left atrial size is dilated. LAA filling defect noted which is concerning for thrombus.   Left Ventricle: The ventricular cavity size is within normal limits.   Pulmonary arteries: Dilated pulmonary artery suggestive of pulmonary hypertension.   Pulmonary veins: Normal pulmonary venous drainage.   Pericardium: Normal thickness with no significant effusion or calcium present.   Mitral Valve: The mitral valve is normal structure without significant calcification.   Extra-cardiac findings: Moderate right pleural effusion. Small left pleural effusion. See attached radiology report for non-cardiac structures.   IMPRESSION: 1. Tricuspid aortic valve with severe calcifications and acquired fusion of the NCC/LCC.   2. Annular measurements support a 26 mm S3 or 29 mm Evolut Pro.   3. Minimal annular calcium under the NCC.   4. Sufficient coronary to annulus distance.   5. Optimal Fluoroscopic Angle for Delivery: LAO 20 CRA 13   6. Circumferential calcifications noted in the ascending aorta.   7. LAA filling defect consistent with  thrombus.   8. Dilated pulmonary artery suggestive of pulmonary hypertension.   9. Moderate right pleural effusion and small left pleural effusion.  CTA chest/abdomen/pelvis 02/03/23: CTA CHEST FINDINGS   Cardiovascular: Cardiomegaly. No pericardial effusion. Normal caliber thoracic aorta with severe atherosclerotic disease. Aortic valve thickening and calcifications. Severe left main and three-vessel coronary artery calcifications.   Mediastinum/Nodes: Esophagus and thyroid are unremarkable. No enlarged lymph nodes seen in the chest.   Lungs/Pleura: Central airways are patent. Right middle lobe atelectasis. Moderate right and small left pleural effusions.   Musculoskeletal: Bilateral total shoulder arthroplasties. No aggressive appearing osseous lesions.   CTA ABDOMEN AND PELVIS FINDINGS   Hepatobiliary: No focal liver abnormality is seen. Status post cholecystectomy. No biliary dilatation.   Pancreas: Unremarkable. No pancreatic ductal dilatation or surrounding inflammatory changes.   Spleen: Normal in size without focal abnormality.   Adrenals/Urinary Tract: Bilateral adrenal glands are unremarkable. No hydronephrosis or nephrolithiasis. Numerous bilateral low-attenuation renal lesions, largest are compatible with simple cysts, others are too small to accurately characterize, no specific follow-up imaging is necessary. Bladder is unremarkable.   Stomach/Bowel: Stomach is within normal limits. Appendix appears normal. Small duodenal diverticulum. Severe diverticulosis of the sigmoid colon. No evidence of bowel wall thickening, distention, or inflammatory changes.   Vascular/lymphatic: Normal caliber abdominal aorta with severe atherosclerotic disease. No enlarged lymph nodes seen in the abdomen or pelvis.   Reproductive: Prostatomegaly.   Other: Small locules of gas seen in the right anterior abdominal wall. No abdominopelvic ascites.   Musculoskeletal: Moderate  to severe degenerative disc disease of the lumbar spine. No acute or significant osseous findings.   VASCULAR MEASUREMENTS PERTINENT TO TAVR:   AORTA:   Minimal Aortic Diameter-12.1 mm   Severity of Aortic Calcification-severe   RIGHT PELVIS:   Right Common Iliac Artery -   Minimal Diameter-4.3 mm   Tortuosity-moderate   Calcification-severe   Right External Iliac Artery -   Minimal Diameter-3.1 mm   Tortuosity-severe   Calcification-severe   Right Common Femoral Artery -   Minimal Diameter-4.8 mm   Tortuosity-mild   Calcification-severe   LEFT PELVIS:  Left Common Iliac Artery -   Minimal Diameter-5.1 mm   Tortuosity-mild   Calcification-severe   Left External Iliac Artery -   Minimal Diameter-4.2 mm   Tortuosity-moderate   Calcification-severe   Left Common Femoral Artery -   Minimal Diameter-4.3 mm   Tortuosity-none   Calcification-severe   Review of the MIP images confirms the above findings.   IMPRESSION: Vascular:   1. Vascular findings and measurements pertinent to potential TAVR procedure, as detailed above. 2. Thickening and calcification of the aortic valve, compatible with reported clinical history of aortic stenosis. 3. Severe aortoiliac atherosclerosis. Left main and 3 vessel coronary artery disease.   Nonvascular:   1. Small locules of gas seen in the right anterior abdominal wall, likely due to subcutaneous injection. Recommend clinical correlation. 2. Moderate right and small left pleural effusions right middle lobe atelectasis. 3. Complete opacification of the left maxillary sinus, likely due to chronic sinusitis.  Recent Labs: 07/24/2022: Magnesium 1.8 01/26/2023: ALT 9; BUN 25; Creatinine, Ser 0.94; Platelets 124 01/28/2023: Hemoglobin 10.2; Hemoglobin 11.2; Potassium 2.6; Potassium 3.1; Sodium 142; Sodium 142    Wt Readings from Last 3 Encounters:  02/26/23 64 kg  02/09/23 66.7 kg  01/28/23 63.5 kg     Assessment and Plan:   1. Severe Aortic Valve Stenosis: He has severe, stage D2 low flow/low gradient aortic valve stenosis. He has NYHA class 2 symptoms. I have personally reviewed the echo images. The aortic valve is thickened and calcified with limited leaflet mobility. I think he would benefit from AVR. Given advanced age, he is not a good candidate for conventional AVR by surgical approach. I think he is a good candidate for TAVR.   I have reviewed the natural history of aortic stenosis with the patient and their family members  who are present today. We have discussed the limitations of medical therapy and the poor prognosis associated with symptomatic aortic stenosis. We have reviewed potential treatment options, including palliative medical therapy, conventional surgical aortic valve replacement, and transcatheter aortic valve replacement. We discussed treatment options in the context of the patient's specific comorbid medical conditions.   Risks and benefits of the valve procedure are reviewed with the patient. We will plan to proceed with placement of a 26 mm Edwards Sapien 3 valve from the left femoral approach on 03/03/23.     Labs/ tests ordered today include:  No orders of the defined types were placed in this encounter.  Disposition:   F/U will be arranged with the structural team  Signed, Verne Carrow, MD, [facility] 02/26/2023 2:52 PM    [facility] [address] [place], [place], [place]  [address] Phone: [phone]; Fax: [phone]

## 2023-02-27 ENCOUNTER — Ambulatory Visit (HOSPITAL_COMMUNITY)
Admission: RE | Admit: 2023-02-27 | Discharge: 2023-02-27 | Disposition: A | Discharge status: Home / Self Care | Payer: Medicare HMO | Source: Ambulatory Visit | Attending: Cardiovascular Disease | Admitting: Cardiovascular Disease

## 2023-02-27 ENCOUNTER — Other Ambulatory Visit: Payer: Self-pay

## 2023-02-27 ENCOUNTER — Encounter (HOSPITAL_COMMUNITY)
Admission: RE | Admit: 2023-02-27 | Discharge: 2023-02-27 | Disposition: A | Discharge status: Home / Self Care | Payer: Medicare HMO | Source: Ambulatory Visit | Attending: Cardiovascular Disease | Admitting: Cardiovascular Disease

## 2023-02-27 DIAGNOSIS — Z96612 Presence of left artificial shoulder joint: Secondary | ICD-10-CM | POA: Diagnosis not present

## 2023-02-27 DIAGNOSIS — I350 Nonrheumatic aortic (valve) stenosis: Secondary | ICD-10-CM

## 2023-02-27 DIAGNOSIS — Z48812 Encounter for surgical aftercare following surgery on the circulatory system: Secondary | ICD-10-CM | POA: Diagnosis not present

## 2023-02-27 DIAGNOSIS — Z01818 Encounter for other preprocedural examination: Secondary | ICD-10-CM | POA: Diagnosis not present

## 2023-02-27 DIAGNOSIS — Z1152 Encounter for screening for COVID-19: Secondary | ICD-10-CM | POA: Diagnosis not present

## 2023-02-27 DIAGNOSIS — R918 Other nonspecific abnormal finding of lung field: Secondary | ICD-10-CM | POA: Diagnosis not present

## 2023-02-27 DIAGNOSIS — Z96611 Presence of right artificial shoulder joint: Secondary | ICD-10-CM | POA: Diagnosis not present

## 2023-02-27 LAB — CBC
HCT: 38.6 % — ABNORMAL LOW (ref 39.0–52.0)
Hemoglobin: 12.5 g/dL — ABNORMAL LOW (ref 13.0–17.0)
MCH: 30.8 pg (ref 26.0–34.0)
MCHC: 32.4 g/dL (ref 30.0–36.0)
MCV: 95.1 fL (ref 80.0–100.0)
Platelets: 115 10*3/uL — ABNORMAL LOW (ref 150–400)
RBC: 4.06 MIL/uL — ABNORMAL LOW (ref 4.22–5.81)
RDW: 13.7 % (ref 11.5–15.5)
WBC: 8 10*3/uL (ref 4.0–10.5)
nRBC: 0 % (ref 0.0–0.2)

## 2023-02-27 LAB — URINALYSIS, ROUTINE W REFLEX MICROSCOPIC
Bacteria, UA: NONE SEEN
Bilirubin Urine: NEGATIVE
Glucose, UA: >=500 mg/dL — AB
Hgb urine dipstick: NEGATIVE
Ketones, ur: NEGATIVE mg/dL
Leukocytes,Ua: NEGATIVE
Nitrite: NEGATIVE
Protein, ur: NEGATIVE mg/dL
Specific Gravity, Urine: 1.014 (ref 1.005–1.030)
pH: 5 (ref 5.0–8.0)

## 2023-02-27 LAB — COMPREHENSIVE METABOLIC PANEL
ALT: 14 U/L (ref 0–44)
AST: 27 U/L (ref 15–41)
Albumin: 3.3 g/dL — ABNORMAL LOW (ref 3.5–5.0)
Alkaline Phosphatase: 77 U/L (ref 38–126)
Anion gap: 9 (ref 5–15)
BUN: 17 mg/dL (ref 8–23)
CO2: 24 mmol/L (ref 22–32)
Calcium: 9 mg/dL (ref 8.9–10.3)
Chloride: 104 mmol/L (ref 98–111)
Creatinine, Ser: 0.8 mg/dL (ref 0.61–1.24)
GFR, Estimated: >60 mL/min (ref >60)
Glucose, Bld: 86 mg/dL (ref 70–99)
Potassium: 3.3 mmol/L — ABNORMAL LOW (ref 3.5–5.1)
Sodium: 137 mmol/L (ref 135–145)
Total Bilirubin: 0.9 mg/dL (ref 0.3–1.2)
Total Protein: 7.1 g/dL (ref 6.5–8.1)

## 2023-02-27 LAB — SURGICAL PCR SCREEN
MRSA, PCR: NEGATIVE
Staphylococcus aureus: POSITIVE — AB

## 2023-02-27 LAB — TYPE AND SCREEN
ABO/RH(D): B NEG
Antibody Screen: NEGATIVE

## 2023-02-27 LAB — SARS CORONAVIRUS 2 (TAT 6-24 HRS): SARS Coronavirus 2: NEGATIVE

## 2023-02-27 LAB — PROTIME-INR
INR: 1.3 — ABNORMAL HIGH (ref 0.8–1.2)
Prothrombin Time: 16.9 seconds — ABNORMAL HIGH (ref 11.4–15.2)

## 2023-02-27 MED ORDER — POTASSIUM CHLORIDE 2 MEQ/ML IV SOLN
80.0000 meq | INTRAVENOUS | Status: DC
  Filled 2023-02-27: qty 40

## 2023-02-27 MED ORDER — HEPARIN 30,000 UNITS/1000 ML (OHS) CELLSAVER SOLUTION
Status: DC
  Filled 2023-02-27: qty 1000

## 2023-02-27 MED ORDER — CEFAZOLIN SODIUM-DEXTROSE 2-4 GM/100ML-% IV SOLN
2.0000 g | INTRAVENOUS | Status: AC
  Administered 2023-03-03: 2 g via INTRAVENOUS
  Filled 2023-02-27: qty 100

## 2023-02-27 MED ORDER — MAGNESIUM SULFATE 50 % IJ SOLN
40.0000 meq | INTRAMUSCULAR | Status: DC
  Filled 2023-02-27: qty 9.85

## 2023-02-27 MED ORDER — NOREPINEPHRINE 4 MG/250ML-% IV SOLN
0.0000 ug/min | INTRAVENOUS | Status: AC
  Administered 2023-03-03: 2 ug/min via INTRAVENOUS
  Administered 2023-03-03: 1 ug/min via INTRAVENOUS
  Filled 2023-02-27: qty 250

## 2023-02-27 MED ORDER — DEXMEDETOMIDINE HCL IN NACL 400 MCG/100ML IV SOLN
0.1000 ug/kg/h | INTRAVENOUS | Status: AC
  Administered 2023-03-03: 1 ug/kg/h via INTRAVENOUS
  Filled 2023-02-27: qty 100

## 2023-02-27 NOTE — Progress Notes (Signed)
Patient signed all consents at PAT lab appointment. CHG soap and instructions were given to patient. CHG surgical prep reviewed with patient and all questions answered.  Patients chart send to anesthesia for review.  Pt endorses a runny/stuffy nose related to seasonal allergies. He denies any respiratory illness/infection in the last two months.

## 2023-03-02 NOTE — Anesthesia Preprocedure Evaluation (Signed)
Anesthesia Evaluation  Patient identified by MRN, date of birth, ID band Patient awake    Reviewed: Allergy & Precautions, H&P , NPO status , Patient's Chart, lab work & pertinent test results  Airway Mallampati: II  TM Distance: >3 FB Neck ROM: Full    Dental no notable dental hx. (+) Edentulous Upper, Edentulous Lower   Pulmonary COPD, former smoker   Pulmonary exam normal breath sounds clear to auscultation       Cardiovascular hypertension, + CAD and +CHF  Normal cardiovascular exam+ dysrhythmias Atrial Fibrillation + Valvular Problems/Murmurs AS  Rhythm:Irregular Rate:Normal     Neuro/Psych negative neurological ROS  negative psych ROS   GI/Hepatic Neg liver ROS, PUD,,,  Endo/Other  negative endocrine ROS    Renal/GU negative Renal ROS  negative genitourinary   Musculoskeletal  (+) Arthritis ,    Abdominal   Peds negative pediatric ROS (+)  Hematology Thrombocytopenia   Anesthesia Other Findings   Reproductive/Obstetrics negative OB ROS                              Anesthesia Physical Anesthesia Plan  ASA: 4  Anesthesia Plan: MAC   Post-op Pain Management:    Induction: Intravenous  PONV Risk Score and Plan: Propofol infusion and Treatment may vary due to age or medical condition  Airway Management Planned: Natural Airway  Additional Equipment:   Intra-op Plan:   Post-operative Plan:   Informed Consent: I have reviewed the patients History and Physical, chart, labs and discussed the procedure including the risks, benefits and alternatives for the proposed anesthesia with the patient or authorized representative who has indicated his/her understanding and acceptance.     Dental advisory given  Plan Discussed with: CRNA  Anesthesia Plan Comments: (MAC Prop + dex  2 PIV)         Anesthesia Quick Evaluation

## 2023-03-03 ENCOUNTER — Other Ambulatory Visit: Payer: Self-pay | Admitting: Physician Assistant

## 2023-03-03 ENCOUNTER — Inpatient Hospital Stay (HOSPITAL_COMMUNITY)
Admission: RE | Admit: 2023-03-03 | Discharge: 2023-03-04 | DRG: 267 | Disposition: A | Discharge status: Home / Self Care | Payer: Medicare HMO | Attending: Cardiovascular Disease | Admitting: Cardiovascular Disease

## 2023-03-03 ENCOUNTER — Other Ambulatory Visit: Payer: Self-pay

## 2023-03-03 ENCOUNTER — Encounter (HOSPITAL_COMMUNITY)
Admission: RE | Disposition: A | Discharge status: Home / Self Care | Payer: Self-pay | Source: Home / Self Care | Attending: Cardiovascular Disease

## 2023-03-03 ENCOUNTER — Inpatient Hospital Stay (HOSPITAL_COMMUNITY): Payer: Medicare HMO

## 2023-03-03 ENCOUNTER — Encounter (HOSPITAL_COMMUNITY): Payer: Self-pay | Admitting: Cardiovascular Disease

## 2023-03-03 DIAGNOSIS — Z9049 Acquired absence of other specified parts of digestive tract: Secondary | ICD-10-CM | POA: Diagnosis not present

## 2023-03-03 DIAGNOSIS — I350 Nonrheumatic aortic (valve) stenosis: Secondary | ICD-10-CM

## 2023-03-03 DIAGNOSIS — J309 Allergic rhinitis, unspecified: Secondary | ICD-10-CM | POA: Diagnosis present

## 2023-03-03 DIAGNOSIS — Z96653 Presence of artificial knee joint, bilateral: Secondary | ICD-10-CM | POA: Diagnosis present

## 2023-03-03 DIAGNOSIS — Z96611 Presence of right artificial shoulder joint: Secondary | ICD-10-CM | POA: Diagnosis present

## 2023-03-03 DIAGNOSIS — I110 Hypertensive heart disease with heart failure: Secondary | ICD-10-CM | POA: Diagnosis not present

## 2023-03-03 DIAGNOSIS — I2510 Atherosclerotic heart disease of native coronary artery without angina pectoris: Secondary | ICD-10-CM | POA: Diagnosis present

## 2023-03-03 DIAGNOSIS — I4819 Other persistent atrial fibrillation: Secondary | ICD-10-CM | POA: Diagnosis not present

## 2023-03-03 DIAGNOSIS — Z86010 Personal history of colonic polyps: Secondary | ICD-10-CM

## 2023-03-03 DIAGNOSIS — Z7982 Long term (current) use of aspirin: Secondary | ICD-10-CM | POA: Diagnosis not present

## 2023-03-03 DIAGNOSIS — Z7901 Long term (current) use of anticoagulants: Secondary | ICD-10-CM

## 2023-03-03 DIAGNOSIS — Z79899 Other long term (current) drug therapy: Secondary | ICD-10-CM

## 2023-03-03 DIAGNOSIS — I509 Heart failure, unspecified: Secondary | ICD-10-CM

## 2023-03-03 DIAGNOSIS — I5022 Chronic systolic (congestive) heart failure: Secondary | ICD-10-CM | POA: Diagnosis present

## 2023-03-03 DIAGNOSIS — I10 Essential (primary) hypertension: Secondary | ICD-10-CM | POA: Diagnosis present

## 2023-03-03 DIAGNOSIS — Z006 Encounter for examination for normal comparison and control in clinical research program: Secondary | ICD-10-CM

## 2023-03-03 DIAGNOSIS — E785 Hyperlipidemia, unspecified: Secondary | ICD-10-CM | POA: Diagnosis present

## 2023-03-03 DIAGNOSIS — J439 Emphysema, unspecified: Secondary | ICD-10-CM | POA: Diagnosis present

## 2023-03-03 DIAGNOSIS — Z87891 Personal history of nicotine dependence: Secondary | ICD-10-CM | POA: Diagnosis not present

## 2023-03-03 DIAGNOSIS — Z23 Encounter for immunization: Secondary | ICD-10-CM | POA: Diagnosis not present

## 2023-03-03 DIAGNOSIS — Z8249 Family history of ischemic heart disease and other diseases of the circulatory system: Secondary | ICD-10-CM

## 2023-03-03 DIAGNOSIS — I700 Atherosclerosis of aorta: Secondary | ICD-10-CM | POA: Diagnosis not present

## 2023-03-03 DIAGNOSIS — Z952 Presence of prosthetic heart valve: Secondary | ICD-10-CM

## 2023-03-03 DIAGNOSIS — Z7984 Long term (current) use of oral hypoglycemic drugs: Secondary | ICD-10-CM

## 2023-03-03 DIAGNOSIS — Z96612 Presence of left artificial shoulder joint: Secondary | ICD-10-CM | POA: Diagnosis present

## 2023-03-03 HISTORY — DX: Nonrheumatic aortic (valve) stenosis: I35.0

## 2023-03-03 HISTORY — PX: INTRAOPERATIVE TRANSTHORACIC ECHOCARDIOGRAM: SHX6523

## 2023-03-03 HISTORY — PX: TRANSCATHETER AORTIC VALVE REPLACEMENT, TRANSFEMORAL: SHX6400

## 2023-03-03 HISTORY — DX: Dyspnea, unspecified: R06.00

## 2023-03-03 HISTORY — DX: Presence of prosthetic heart valve: Z95.2

## 2023-03-03 LAB — POCT I-STAT, CHEM 8
BUN: 16 mg/dL (ref 8–23)
Calcium, Ion: 1.26 mmol/L (ref 1.15–1.40)
Chloride: 105 mmol/L (ref 98–111)
Creatinine, Ser: 0.6 mg/dL — ABNORMAL LOW (ref 0.61–1.24)
Glucose, Bld: 143 mg/dL — ABNORMAL HIGH (ref 70–99)
HCT: 30 % — ABNORMAL LOW (ref 39.0–52.0)
Hemoglobin: 10.2 g/dL — ABNORMAL LOW (ref 13.0–17.0)
Potassium: 4.1 mmol/L (ref 3.5–5.1)
Sodium: 139 mmol/L (ref 135–145)
TCO2: 20 mmol/L — ABNORMAL LOW (ref 22–32)

## 2023-03-03 LAB — ECHOCARDIOGRAM LIMITED
AR max vel: 0.82 cm2
AV Area VTI: 0.84 cm2
AV Area mean vel: 0.81 cm2
AV Mean grad: 12 mmHg
AV Peak grad: 17.1 mmHg
Ao pk vel: 2.07 m/s
P 1/2 time: 404 ms
S' Lateral: 3.4 cm

## 2023-03-03 LAB — POCT ACTIVATED CLOTTING TIME
Activated Clotting Time: 256 s
Activated Clotting Time: 275 s
Activated Clotting Time: 305 s
Activated Clotting Time: 140 s

## 2023-03-03 LAB — BASIC METABOLIC PANEL
Anion gap: 16 — ABNORMAL HIGH (ref 5–15)
BUN: 11 mg/dL (ref 8–23)
CO2: 13 mmol/L — ABNORMAL LOW (ref 22–32)
Calcium: 8.2 mg/dL — ABNORMAL LOW (ref 8.9–10.3)
Chloride: 106 mmol/L (ref 98–111)
Creatinine, Ser: 0.46 mg/dL — ABNORMAL LOW (ref 0.61–1.24)
GFR, Estimated: >60 mL/min (ref >60)
Glucose, Bld: 54 mg/dL — ABNORMAL LOW (ref 70–99)
Potassium: 4.4 mmol/L (ref 3.5–5.1)
Sodium: 135 mmol/L (ref 135–145)

## 2023-03-03 LAB — PROTIME-INR
INR: 1.2 (ref 0.8–1.2)
Prothrombin Time: 15.1 s (ref 11.4–15.2)

## 2023-03-03 SURGERY — IMPLANTATION, AORTIC VALVE, TRANSCATHETER, FEMORAL APPROACH
Anesthesia: Monitor Anesthesia Care

## 2023-03-03 MED ORDER — LORATADINE 10 MG PO TABS: 10.0000 mg | ORAL_TABLET | Freq: Every day | ORAL | Status: DC | PRN

## 2023-03-03 MED ORDER — PROTAMINE SULFATE 10 MG/ML IV SOLN
INTRAVENOUS | Status: DC | PRN
  Administered 2023-03-03 (×8): 10 mg via INTRAVENOUS

## 2023-03-03 MED ORDER — NOREPINEPHRINE BITARTRATE 1 MG/ML IV SOLN
0.0000 ug/min | INTRAVENOUS | Status: DC
  Filled 2023-03-03: qty 4

## 2023-03-03 MED ORDER — FINASTERIDE 5 MG PO TABS
5.0000 mg | ORAL_TABLET | Freq: Every day | ORAL | Status: DC
  Administered 2023-03-03 – 2023-03-04 (×2): 5 mg via ORAL
  Filled 2023-03-03 (×2): qty 1

## 2023-03-03 MED ORDER — HEPARIN SODIUM (PORCINE) 1000 UNIT/ML IJ SOLN
INTRAMUSCULAR | Status: DC | PRN
  Administered 2023-03-03: 4000 [IU] via INTRAVENOUS
  Administered 2023-03-03: 9000 [IU] via INTRAVENOUS

## 2023-03-03 MED ORDER — ASPIRIN 81 MG PO TBEC
81.0000 mg | DELAYED_RELEASE_TABLET | Freq: Every day | ORAL | Status: DC
  Administered 2023-03-03 – 2023-03-04 (×2): 81 mg via ORAL
  Filled 2023-03-03 (×2): qty 1

## 2023-03-03 MED ORDER — SODIUM CHLORIDE 0.9% FLUSH: 3.0000 mL | INTRAVENOUS | Status: DC | PRN

## 2023-03-03 MED ORDER — TAMSULOSIN HCL 0.4 MG PO CAPS
0.4000 mg | ORAL_CAPSULE | Freq: Every day | ORAL | Status: DC
  Administered 2023-03-03: 0.4 mg via ORAL
  Filled 2023-03-03: qty 1

## 2023-03-03 MED ORDER — INFLUENZA VAC A&B SURF ANT ADJ 0.5 ML IM SUSY
0.5000 mL | PREFILLED_SYRINGE | INTRAMUSCULAR | Status: AC
  Administered 2023-03-04: 0.5 mL via INTRAMUSCULAR
  Filled 2023-03-03: qty 0.5

## 2023-03-03 MED ORDER — SPIRONOLACTONE 12.5 MG HALF TABLET
12.5000 mg | ORAL_TABLET | Freq: Every day | ORAL | Status: DC
  Administered 2023-03-03 – 2023-03-04 (×2): 12.5 mg via ORAL
  Filled 2023-03-03 (×2): qty 1

## 2023-03-03 MED ORDER — LIDOCAINE HCL (PF) 1 % IJ SOLN
INTRAMUSCULAR | Status: DC | PRN
  Administered 2023-03-03 (×2): 10 mL

## 2023-03-03 MED ORDER — ONDANSETRON HCL 4 MG/2ML IJ SOLN
INTRAMUSCULAR | Status: DC | PRN
  Administered 2023-03-03: 4 mg via INTRAVENOUS

## 2023-03-03 MED ORDER — DOCUSATE SODIUM 100 MG PO CAPS
100.0000 mg | ORAL_CAPSULE | Freq: Two times a day (BID) | ORAL | Status: DC
  Administered 2023-03-03 (×2): 100 mg via ORAL
  Filled 2023-03-03 (×3): qty 1

## 2023-03-03 MED ORDER — VIPERSLIDE LUBRICANT OPTIME: TOPICAL | Status: DC | PRN

## 2023-03-03 MED ORDER — MORPHINE SULFATE (PF) 2 MG/ML IV SOLN: 1.0000 mg | INTRAVENOUS | Status: DC | PRN

## 2023-03-03 MED ORDER — CHLORHEXIDINE GLUCONATE 4 % EX SOLN: 60.0000 mL | Freq: Once | CUTANEOUS | Status: DC

## 2023-03-03 MED ORDER — ACETAMINOPHEN 325 MG PO TABS: 650.0000 mg | ORAL_TABLET | Freq: Four times a day (QID) | ORAL | Status: DC | PRN

## 2023-03-03 MED ORDER — HEPARIN (PORCINE) IN NACL 1000-0.9 UT/500ML-% IV SOLN
INTRAVENOUS | Status: DC | PRN
  Administered 2023-03-03 (×2): 500 mL

## 2023-03-03 MED ORDER — TRAMADOL HCL 50 MG PO TABS
50.0000 mg | ORAL_TABLET | ORAL | Status: DC | PRN
  Administered 2023-03-03: 50 mg via ORAL
  Filled 2023-03-03: qty 1

## 2023-03-03 MED ORDER — SODIUM CHLORIDE 0.9 % IV SOLN: INTRAVENOUS | Status: DC

## 2023-03-03 MED ORDER — OXYCODONE HCL 5 MG PO TABS: 5.0000 mg | ORAL_TABLET | ORAL | Status: DC | PRN

## 2023-03-03 MED ORDER — NITROGLYCERIN IN D5W 200-5 MCG/ML-% IV SOLN: 0.0000 ug/min | INTRAVENOUS | Status: DC

## 2023-03-03 MED ORDER — CHLORHEXIDINE GLUCONATE 4 % EX SOLN: 30.0000 mL | CUTANEOUS | Status: DC

## 2023-03-03 MED ORDER — ONDANSETRON HCL 4 MG/2ML IJ SOLN: 4.0000 mg | Freq: Four times a day (QID) | INTRAMUSCULAR | Status: DC | PRN

## 2023-03-03 MED ORDER — SODIUM CHLORIDE 0.9 % IV SOLN: INTRAVENOUS | Status: AC

## 2023-03-03 MED ORDER — NOREPINEPHRINE 4 MG/250ML-% IV SOLN
INTRAVENOUS | Status: AC
  Filled 2023-03-03: qty 250

## 2023-03-03 MED ORDER — FENTANYL CITRATE (PF) 100 MCG/2ML IJ SOLN
INTRAMUSCULAR | Status: DC | PRN
Start: 2023-03-03 — End: 2023-03-03
  Administered 2023-03-03 (×2): 25 ug via INTRAVENOUS

## 2023-03-03 MED ORDER — ACETAMINOPHEN 650 MG RE SUPP: 650.0000 mg | Freq: Four times a day (QID) | RECTAL | Status: DC | PRN

## 2023-03-03 MED ORDER — SODIUM CHLORIDE 0.9 % IV SOLN: 250.0000 mL | INTRAVENOUS | Status: DC | PRN

## 2023-03-03 MED ORDER — IODIXANOL 320 MG/ML IV SOLN
INTRAVENOUS | Status: DC | PRN
  Administered 2023-03-03: 68 mL via INTRA_ARTERIAL

## 2023-03-03 MED ORDER — LACTATED RINGERS IV SOLN: INTRAVENOUS | Status: DC

## 2023-03-03 MED ORDER — CEFAZOLIN SODIUM-DEXTROSE 2-4 GM/100ML-% IV SOLN
2.0000 g | Freq: Three times a day (TID) | INTRAVENOUS | Status: AC
  Administered 2023-03-03 (×2): 2 g via INTRAVENOUS
  Filled 2023-03-03 (×2): qty 100

## 2023-03-03 MED ORDER — SODIUM CHLORIDE 0.9% FLUSH: 3.0000 mL | Freq: Two times a day (BID) | INTRAVENOUS | Status: DC

## 2023-03-03 MED ORDER — CHLORHEXIDINE GLUCONATE 0.12 % MT SOLN
15.0000 mL | Freq: Once | OROMUCOSAL | Status: AC
  Administered 2023-03-03: 15 mL via OROMUCOSAL
  Filled 2023-03-03: qty 15

## 2023-03-03 MED ORDER — SACUBITRIL-VALSARTAN 24-26 MG PO TABS
1.0000 | ORAL_TABLET | Freq: Two times a day (BID) | ORAL | Status: DC
  Administered 2023-03-03 – 2023-03-04 (×3): 1 via ORAL
  Filled 2023-03-03 (×3): qty 1

## 2023-03-03 MED ORDER — POTASSIUM CHLORIDE CRYS ER 20 MEQ PO TBCR
20.0000 meq | EXTENDED_RELEASE_TABLET | Freq: Every day | ORAL | Status: DC
  Administered 2023-03-03 – 2023-03-04 (×2): 20 meq via ORAL
  Filled 2023-03-03 (×2): qty 1

## 2023-03-03 MED ORDER — DAPAGLIFLOZIN PROPANEDIOL 10 MG PO TABS
10.0000 mg | ORAL_TABLET | Freq: Every day | ORAL | Status: DC
  Administered 2023-03-04: 10 mg via ORAL
  Filled 2023-03-03: qty 1

## 2023-03-03 MED ORDER — ATORVASTATIN CALCIUM 40 MG PO TABS
40.0000 mg | ORAL_TABLET | Freq: Every day | ORAL | Status: DC
  Administered 2023-03-03 – 2023-03-04 (×2): 40 mg via ORAL
  Filled 2023-03-03 (×2): qty 1

## 2023-03-03 MED ORDER — FENTANYL CITRATE (PF) 100 MCG/2ML IJ SOLN
INTRAMUSCULAR | Status: AC
  Filled 2023-03-03: qty 2

## 2023-03-03 SURGICAL SUPPLY — 34 items
BALLN MUSTANG 7.0X40 75 (BALLOONS) ×2
BALLOON MUSTANG 7.0X40 75 (BALLOONS) IMPLANT
CABLE ADAPT PACING TEMP 12FT (ADAPTER) IMPLANT
CATH 26 ULTRA DELIVERY (CATHETERS) IMPLANT
CATH DIAG 6FR PIGTAIL ANGLED (CATHETERS) IMPLANT
CATH INFINITI 5FR ANG PIGTAIL (CATHETERS) IMPLANT
CATH INFINITI 6F AL2 (CATHETERS) IMPLANT
CATH S G BIP PACING (CATHETERS) IMPLANT
CATH SHOCKWAVE M5 6.0X60 (CATHETERS) IMPLANT
CATH SHOCKWAVE M5 7.0X60 (CATHETERS) IMPLANT
CATH SHOCKWAVE M5 8.0X60 (CATHETERS) IMPLANT
CATH-GARD ARROW CATH SHIELD (MISCELLANEOUS) ×2
CLOSURE PERCLOSE PROSTYLE (VASCULAR PRODUCTS) IMPLANT
CRIMPER (MISCELLANEOUS) IMPLANT
DEVICE INFLATION ATRION QL2530 (MISCELLANEOUS) IMPLANT
KIT ENCORE 26 ADVANTAGE (KITS) IMPLANT
KIT MICROPUNCTURE NIT STIFF (SHEATH) IMPLANT
KIT SAPIAN 3 ULTRA RESILIA 26 (Valve) IMPLANT
LUBRICANT VIPERSLIDE CORONARY (MISCELLANEOUS) IMPLANT
PACK CARDIAC CATHETERIZATION (CUSTOM PROCEDURE TRAY) ×2 IMPLANT
SET ATX-X65L (MISCELLANEOUS) IMPLANT
SHEATH BRITE TIP 7FR 35CM (SHEATH) IMPLANT
SHEATH INTRODUCER SET 20-26 (SHEATH) IMPLANT
SHEATH PINNACLE 6F 10CM (SHEATH) IMPLANT
SHEATH PINNACLE 8F 10CM (SHEATH) IMPLANT
SHIELD CATHGARD ARROW (MISCELLANEOUS) IMPLANT
STOPCOCK MORSE 400PSI 3WAY (MISCELLANEOUS) ×4 IMPLANT
TRANSDUCER W/STOPCOCK (MISCELLANEOUS) ×4 IMPLANT
WIRE AMPLATZ SS-J .035X180CM (WIRE) IMPLANT
WIRE EMERALD 3MM-J .035X150CM (WIRE) IMPLANT
WIRE EMERALD 3MM-J .035X260CM (WIRE) IMPLANT
WIRE EMERALD ST .035X260CM (WIRE) IMPLANT
WIRE SAFARI SM CURVE 275 (WIRE) IMPLANT
WIRE SPARTACORE .014X190CM (WIRE) IMPLANT

## 2023-03-03 NOTE — Anesthesia Postprocedure Evaluation (Signed)
Anesthesia Post Note  Patient: [name]  Procedure(s) Performed: Transcatheter Aortic Valve Replacement, Transfemoral (Left) INTRAOPERATIVE TRANSTHORACIC ECHOCARDIOGRAM     Patient location during evaluation: PACU Anesthesia Type: MAC Level of consciousness: awake and alert Pain management: pain level controlled Vital Signs Assessment: post-procedure vital signs reviewed and stable Respiratory status: spontaneous breathing, nonlabored ventilation, respiratory function stable and patient connected to nasal cannula oxygen Cardiovascular status: blood pressure returned to baseline and stable Postop Assessment: no apparent nausea or vomiting Anesthetic complications: no   There were no known notable events for this encounter.  Last Vitals:  Vitals:   03/03/23 1355 03/03/23 1400  BP: 107/60 (!) 111/56  Pulse: (!) 59 60  Resp: 15 14  Temp:    SpO2: 95% 95%    Last Pain:  Vitals:   03/03/23 1305  TempSrc: Temporal  PainSc:                  [name]

## 2023-03-03 NOTE — Progress Notes (Signed)
Dr. Charlynn Grimes notified of patient's BP 81/39-89/48.  HR 50's, dropping in 40's on occasion.  Orders received to give 250 ml saline bolus and norepinephrine gtt start off .

## 2023-03-03 NOTE — H&P (Signed)
[address]       [place] [address]             [id number]                                   [facility]W[place] WANT [facility] Medical Record #[id number] Date of Birth: 03/24/1946   [name], MD   Chief Complaint:  AS and CAD    History of Present Illness:     Pt is a pleasant 77 yo male with a recent work up for increasing CHF and DOE. Pt has known cardiac history of CAD from cath in 2007 and then Afib that began in 2020 treated with cardioversions and anticoagulation. Pt had a difficult time last year from cholecystitis and complicated with sepsis and bladder rupture but had recovered. He had been having fluid retention and increasing DOE and was treated with lasix and has been doing better. He had a cardiomyopathy in the past with EF 25% and was also using alcohol which has since stopped. He was noted to have LFLG AS with a mean gradient in upper teens in the past but he recent echo had EF 35% with MG of and felt again to represent severe LFLG AS with a valve area of 0.67cm2. He was felt to require AVR and work up with cath revealed a highly calcified ascending aorta and 3 VCAD. Pt had CTA of chest with extensive PAD and extremely calcified ascending aorta on upper 2/3rds of aorta. Pt was sent for surgical evaluation. Pt has no CP or lightheadedness.             Past Medical History:  Diagnosis Date   Allergic rhinitis 08/08/2013    takes Loratadine daily    Aortic stenosis 09/08/2011    With mild aortic regurgitation, mean gradient 13 mmHg    Cataract      right but immature   CHF (congestive heart failure) (HCC) 2007   Complication of anesthesia      stopped breathing - during shoulder surgery 2009-2010   Coronary artery disease 10/02/2009    Cardiac cath (October 2007): 20% left main, 60% mid LAD, 60-70% distal LAD, 30-40% first diagonal, 30% circumflex, 40% OM, 30-40% RCA    Degenerative joint disease of  shoulder 08/03/2013    Bilateral, s/p right total shoulder arthroplasty 05/29/2011 and left shoulder arthroplasty 06/08/2014   Diastolic dysfunction 04/04/2016    Echo (04/04/2016): Grade I   Diverticulosis 10/07/2013    Seen on colonoscopy in 2007    Erectile dysfunction 05/07/2009   Essential hypertension 08/03/2013    had been on Lisinopril but stopped per MD   First degree AV block 03/14/2016   Gastric ulcer 10/07/2013    Seen on EGD 04/10/2006    Hemorrhoids 10/07/2013   Hyperlipidemia 10/02/2009   Paroxysmal atrial fibrillation (HCC) 07/14/2006    One episode, provoked by alcohol use disorder, briefly anticoagulated with warfarin, then switched to aspirin alone   Pre-diabetes     Sciatica associated with disorder of lumbar spine 08/03/2013    Anterolisthesis with right L 4-5 nerve root compression.  Treated with epidural injections and Physical Therapy.    Seborrheic keratosis 10/07/2013   Tubular adenoma of colon                 Past Surgical History:  Procedure Laterality  Date   BIOPSY   06/02/2022    Procedure: BIOPSY;  Surgeon: Meridee Score Netty Starring., MD;  Location: [facility];  Service: Gastroenterology;;   CARDIAC CATHETERIZATION   2007   CARDIOVERSION       CARDIOVERSION N/A 08/04/2019    Procedure: CARDIOVERSION;  Surgeon: Pricilla Riffle, MD;  Location: [facility]COPY;  Service: Cardiovascular;  Laterality: N/A;   CHOLECYSTECTOMY N/A 04/21/2022    Procedure: LAPAROSCOPIC CHOLECYSTECTOMY;  Surgeon: Diamantina Monks, MD;  Location: [facility] OR;  Service: General;  Laterality: N/A;   COLONOSCOPY       CYSTOSCOPY N/A 01/04/2022    Procedure: CYSTOSCOPY;  Surgeon: Heloise Purpura, MD;  Location: [facility] OR;  Service: Urology;  Laterality: N/A;   ENDOSCOPIC RETROGRADE CHOLANGIOPANCREATOGRAPHY (ERCP) WITH PROPOFOL N/A 06/02/2022    Procedure: ENDOSCOPIC RETROGRADE CHOLANGIOPANCREATOGRAPHY (ERCP) WITH PROPOFOL;  Surgeon: Meridee Score Netty Starring., MD;  Location: [facility];  Service:  Gastroenterology;  Laterality: N/A;   ERCP N/A 04/21/2022    Procedure: ATTEMPTED ENDOSCOPIC RETROGRADE CHOLANGIOPANCREATOGRAPHY (ERCP);  Surgeon: Iva Boop, MD;  Location: [facility] OR;  Service: Gastroenterology;  Laterality: N/A;   ESOPHAGOGASTRODUODENOSCOPY (EGD) WITH PROPOFOL N/A 06/02/2022    Procedure: ESOPHAGOGASTRODUODENOSCOPY (EGD) WITH PROPOFOL;  Surgeon: Meridee Score Netty Starring., MD;  Location: [facility] ENDOSCOPY;  Service: Gastroenterology;  Laterality: N/A;   ESOPHAGOGASTRODUODENOSCOPY (EGD) WITH PROPOFOL N/A 07/22/2022    Procedure: ESOPHAGOGASTRODUODENOSCOPY (EGD) WITH PROPOFOL;  Surgeon: Iva Boop, MD;  Location: [facility];  Service: Gastroenterology;  Laterality: N/A;   EYE SURGERY Bilateral 2022    bilateral cataracts   HEMOSTASIS CLIP PLACEMENT   06/02/2022    Procedure: HEMOSTASIS CLIP PLACEMENT;  Surgeon: Lemar Lofty., MD;  Location: [facility];  Service: Gastroenterology;;   INSERTION OF MESH N/A 03/11/2019    Procedure: Insertion Of Mesh;  Surgeon: Axel Filler, MD;  Location: [facility] OR;  Service: General;  Laterality: N/A;   INTRAOPERATIVE CHOLANGIOGRAM N/A 04/21/2022    Procedure: INTRAOPERATIVE CHOLANGIOGRAM;  Surgeon: Diamantina Monks, MD;  Location: [facility] OR;  Service: General;  Laterality: N/A;   IR EXCHANGE BILIARY DRAIN   02/14/2022   IR PERC CHOLECYSTOSTOMY   12/29/2021   JOINT REPLACEMENT       KNEE ARTHROSCOPY WITH MENISCAL REPAIR Right 01/30/2011   LAPAROTOMY N/A 01/04/2022    Procedure: EXPLORATORY LAPAROTOMY WITH EXTRAPERITONEAL BLADDER REPAIR;  Surgeon: Heloise Purpura, MD;  Location: [facility] OR;  Service: Urology;  Laterality: N/A;   PANCREATIC STENT PLACEMENT   06/02/2022    Procedure: PANCREATIC STENT PLACEMENT;  Surgeon: Meridee Score Netty Starring., MD;  Location: [facility];  Service: Gastroenterology;;   PANCREATIC STENT PLACEMENT   07/22/2022    Procedure: PANCREATIC STENT REMOVAL;  Surgeon: Iva Boop, MD;  Location: [facility];  Service:  Gastroenterology;;   POLYPECTOMY   06/02/2022    Procedure: POLYPECTOMY;  Surgeon: Lemar Lofty., MD;  Location: [facility];  Service: Gastroenterology;;   right finger surgery        as a child   RIGHT/LEFT HEART CATH AND CORONARY ANGIOGRAPHY N/A 01/28/2023    Procedure: RIGHT/LEFT HEART CATH AND CORONARY ANGIOGRAPHY;  Surgeon: Kathleene Hazel, MD;  Location: [facility];  Service: Cardiovascular;  Laterality: N/A;   SPHINCTEROTOMY   06/02/2022    Procedure: SPHINCTEROTOMY;  Surgeon: Mansouraty, Netty Starring., MD;  Location: [facility];  Service: Gastroenterology;;   TONSILLECTOMY       TOTAL KNEE ARTHROPLASTY Left 07/28/2017    Procedure: LEFT TOTAL KNEE ARTHROPLASTY;  Surgeon: Sheral Apley,  MD;  Location: [facility] OR;  Service: Orthopedics;  Laterality: Left;   TOTAL KNEE ARTHROPLASTY Right 07/29/2022    Procedure: TOTAL KNEE ARTHROPLASTY;  Surgeon: Sheral Apley, MD;  Location: [facility] ORS;  Service: Orthopedics;  Laterality: Right;   TOTAL SHOULDER ARTHROPLASTY   05/29/2011    Procedure: TOTAL SHOULDER ARTHROPLASTY;  Surgeon: Vania Rea Supple;  Location: [facility] OR;  Service: Orthopedics;  Laterality: Right;   TOTAL SHOULDER ARTHROPLASTY Left 06/08/2014    DR SUPPLE   TOTAL SHOULDER ARTHROPLASTY Left 06/08/2014    Procedure: LEFT TOTAL SHOULDER ARTHROPLASTY;  Surgeon: Senaida Lange, MD;  Location: [facility] OR;  Service: Orthopedics;  Laterality: Left;   UMBILICAL HERNIA REPAIR N/A 03/11/2019    Procedure: LAPAROSCOPIC UMBILICAL HERNIA;  Surgeon: Axel Filler, MD;  Location: [facility] OR;  Service: General;  Laterality: N/A;          Tobacco Use History  Social History        Tobacco Use  Smoking Status Former   Current packs/day: 0.00   Average packs/day: 1.5 packs/day for 42.0 years (63.1 ttl pk-yrs)   Types: Cigarettes   Start date: 55   Quit date: 07/14/2010   Years since quitting: 12.5  Smokeless Tobacco Never  Tobacco Comments    quit smoking about 43yrs ago       Social History        Substance and Sexual Activity  Alcohol Use Not Currently    Comment: no alcohol since 07      Social History         Socioeconomic History   Marital status: Married      Spouse name: Marjolin- Margie   Number of children: 2   Years of education: Not on file   Highest education level: Not on file  Occupational History   Occupation: Retired  Tobacco Use   Smoking status: Former      Current packs/day: 0.00      Average packs/day: 1.5 packs/day for 42.0 years (63.1 ttl pk-yrs)      Types: Cigarettes      Start date: 23      Quit date: 07/14/2010      Years since quitting: 12.5   Smokeless tobacco: Never   Tobacco comments:      quit smoking about 42yrs ago  Vaping Use   Vaping status: Never Used  Substance and Sexual Activity   Alcohol use: Not Currently      Comment: no alcohol since 07   Drug use: No   Sexual activity: Never      Birth control/protection: None  Other Topics Concern   Not on file  Social History Narrative    Lives [place] with wife and dog.  Former Contractor.    Social Determinants of Health        Financial Resource Strain: Low Risk  (12/24/2022)    Overall Financial Resource Strain (CARDIA)     Difficulty of Paying Living Expenses: Not hard at all  Food Insecurity: No Food Insecurity (12/24/2022)    Hunger Vital Sign     Worried About Running Out of Food in the Last Year: Never true     Ran Out of Food in the Last Year: Never true  Transportation Needs: No Transportation Needs (12/24/2022)    PRAPARE - Therapist, art (Medical): No     Lack of Transportation (Non-Medical): No  Physical Activity: Inactive (12/24/2022)  Exercise Vital Sign     Days of Exercise per Week: 0 days     Minutes of Exercise per Session: 0 min  Stress: No Stress Concern Present (12/24/2022)    [employer] of Occupational Health - Occupational Stress Questionnaire      Feeling of Stress : Not at all  Social Connections: Moderately Isolated (12/24/2022)    Social Connection and Isolation Panel [NHANES]     Frequency of Communication with Friends and Family: Three times a week     Frequency of Social Gatherings with Friends and Family: Never     Attends Religious Services: Never     Database administrator or Organizations: No     Attends Banker Meetings: Never     Marital Status: Married  Catering manager Violence: Not At Risk (12/24/2022)    Humiliation, Afraid, Rape, and Kick questionnaire     Fear of Current or Ex-Partner: No     Emotionally Abused: No     Physically Abused: No     Sexually Abused: No      Allergies  No Known Allergies           Current Outpatient Medications  Medication Sig Dispense Refill   acetaminophen (TYLENOL) 500 MG tablet Take 2 tablets (1,000 mg total) by mouth 4 (four) times daily. (Patient taking differently: Take 1,000 mg by mouth 2 (two) times daily.) 120 tablet 3   aspirin EC 81 MG tablet Take 1 tablet (81 mg total) by mouth daily. Swallow whole.       atorvastatin (LIPITOR) 40 MG tablet TAKE 1 TABLET EVERY DAY 90 tablet 3   carvedilol (COREG) 6.25 MG tablet TAKE 1 TABLET TWICE DAILY WITH MEALS (NEED MD APPOINTMENT) 180 tablet 3   dapagliflozin propanediol (FARXIGA) 10 MG TABS tablet Take 1 tablet (10 mg total) by mouth daily before breakfast. 28 tablet     docusate sodium (COLACE) 100 MG capsule Take 1 capsule (100 mg total) by mouth 2 (two) times daily. 60 capsule 1   enoxaparin (LOVENOX) 100 MG/ML injection Inject 1 mL (100 mg total) into the skin daily. 10 mL 0   finasteride (PROSCAR) 5 MG tablet Take 1 tablet (5 mg total) by mouth daily. 30 tablet 5   fluticasone (FLONASE) 50 MCG/ACT nasal spray Place 2 sprays into both nostrils daily as needed for allergies or rhinitis. 16 g 0   furosemide (LASIX) 20 MG tablet Take 2 tablets (40 mg total) by mouth daily.       loratadine (CLARITIN) 10 MG  tablet Take 10 mg by mouth daily as needed (wheezing).       sacubitril-valsartan (ENTRESTO) 24-26 MG Take 1 tablet by mouth 2 (two) times daily. 28 tablet     senna-docusate (SENOKOT-S) 8.6-50 MG tablet Take 1 tablet by mouth 2 (two) times daily.       silodosin (RAPAFLO) 8 MG CAPS capsule Take 1 capsule (8 mg total) by mouth daily with breakfast. (Patient taking differently: Take 8 mg by mouth daily as needed (urinary retention).) 30 capsule 3   spironolactone (ALDACTONE) 25 MG tablet Take 0.5 tablets (12.5 mg total) by mouth daily. 45 tablet 3   warfarin (COUMADIN) 2.5 MG tablet Take 1/2 a tablet to 1 tablet by mouth daily as directed by the coumadin clinic (Patient taking differently: Take 2.5 mg daily except Sundays take 1.25 mg daily) 90 tablet 0      No current facility-administered medications for this visit.  Family History  Problem Relation Age of Onset   Bradycardia Mother 37        Requiring pacemeaker placement   Coronary artery disease Father 74        Died of myocardial infarction   Coronary artery disease Brother 72        Died of myocardial infarction   Obesity Daughter     Healthy Son     Colon cancer Neg Hx     Esophageal cancer Neg Hx     Stomach cancer Neg Hx     Colon polyps Neg Hx                  Physical Exam: Teeth in acceptable condition Lungs: clear Card:RR with 3/6 sem Ext: evidence of vericosities Neuro: intact         Diagnostic Studies & Laboratory data: I have personally reviewed the following studies and agree with the findings   TTE (12/2022) IMPRESSIONS     1. Left ventricular ejection fraction, by estimation, is 35%. The left  ventricle has moderately decreased function. The left ventricle  demonstrates global hypokinesis. There is mild concentric left ventricular  hypertrophy. Left ventricular diastolic  parameters are indeterminate.   2. Right ventricular systolic function is mildly reduced. The right   ventricular size is mildly enlarged. There is moderately elevated  pulmonary artery systolic pressure. The estimated right ventricular  systolic pressure is 59.9 mmHg.   3. Left atrial size was severely dilated.   4. Right atrial size was moderately dilated.   5. The mitral valve is normal in structure. Mild to moderate mitral valve  regurgitation. No evidence of mitral stenosis.   6. Tricuspid valve regurgitation is moderate.   7. The aortic valve is tricuspid. There is severe calcifcation of the  aortic valve. Aortic valve regurgitation is mild. Severe low flow/low  gradient aortic valve stenosis. Aortic valve area, by VTI measures 0.64  cm. Aortic valve mean gradient measures  20.0 mmHg.   8. The inferior vena cava is dilated in size with <50% respiratory  variability, suggesting right atrial pressure of 15 mmHg.   9. A small pericardial effusion is present.  10. The patient was in atrial fibrillation.   FINDINGS   Left Ventricle: Left ventricular ejection fraction, by estimation, is  35%. The left ventricle has moderately decreased function. The left  ventricle demonstrates global hypokinesis. The left ventricular internal  cavity size was normal in size. There is  mild concentric left ventricular hypertrophy. Left ventricular diastolic  parameters are indeterminate.   Right Ventricle: The right ventricular size is mildly enlarged. No  increase in right ventricular wall thickness. Right ventricular systolic  function is mildly reduced. There is moderately elevated pulmonary artery  systolic pressure. The tricuspid  regurgitant velocity is 3.35 m/s, and with an assumed right atrial  pressure of 15 mmHg, the estimated right ventricular systolic pressure is  59.9 mmHg.   Left Atrium: Left atrial size was severely dilated.   Right Atrium: Right atrial size was moderately dilated.   Pericardium: A small pericardial effusion is present.   Mitral Valve: The mitral valve is  normal in structure. Mild to moderate  mitral valve regurgitation. No evidence of mitral valve stenosis.   Tricuspid Valve: The tricuspid valve is normal in structure. Tricuspid  valve regurgitation is moderate.   Aortic Valve: The aortic valve is tricuspid. There is severe calcifcation  of the aortic valve. Aortic valve regurgitation is mild. Aortic  regurgitation  PHT measures 451 msec. Severe aortic stenosis is present.  Aortic valve mean gradient measures 20.0  mmHg. Aortic valve peak gradient measures 26.1 mmHg. Aortic valve area, by  VTI measures 0.64 cm.   Pulmonic Valve: The pulmonic valve was normal in structure. Pulmonic valve  regurgitation is trivial.   Aorta: The aortic root is normal in size and structure.   Venous: The inferior vena cava is dilated in size with less than 50%  respiratory variability, suggesting right atrial pressure of 15 mmHg.   IAS/Shunts: No atrial level shunt detected by color flow Doppler.     LEFT VENTRICLE  PLAX 2D  LVIDd:         4.10 cm   Diastology  LVIDs:         3.40 cm   LV e' medial:    5.36 cm/s  LV PW:         1.20 cm   LV E/e' medial:  17.5  LV IVS:        1.20 cm   LV e' lateral:   9.25 cm/s  LVOT diam:     2.10 cm   LV E/e' lateral: 10.1  LV SV:         33  LV SV Index:   19  LVOT Area:     3.46 cm     RIGHT VENTRICLE            IVC  RV Basal diam:  4.90 cm    IVC diam: 2.10 cm  RV S prime:     7.23 cm/s  TAPSE (M-mode): 1.3 cm   LEFT ATRIUM              Index        RIGHT ATRIUM           Index  LA diam:        5.70 cm  3.26 cm/m   RA Area:     24.50 cm  LA Vol (A2C):   115.0 ml 65.70 ml/m  RA Volume:   72.40 ml  41.36 ml/m  LA Vol (A4C):   102.0 ml 58.27 ml/m  LA Biplane Vol: 109.0 ml 62.27 ml/m   AORTIC VALVE  AV Area (Vmax):    0.67 cm  AV Area (Vmean):   0.58 cm  AV Area (VTI):     0.64 cm  AV Vmax:           255.40 cm/s  AV Vmean:          194.200 cm/s  AV VTI:            0.518 m  AV Peak Grad:       26.1 mmHg  AV Mean Grad:      20.0 mmHg  LVOT Vmax:         49.25 cm/s  LVOT Vmean:        32.250 cm/s  LVOT VTI:          0.095 m  LVOT/AV VTI ratio: 0.18  AI PHT:            451 msec    AORTA  Ao Root diam: 3.50 cm  Ao Asc diam:  3.50 cm   MR Peak grad: 101.4 mmHg   TRICUSPID VALVE  MR Mean grad: 73.5 mmHg    TR Peak grad:   44.9 mmHg  MR Vmax:      503.50 cm/s  TR Vmax:  335.00 cm/s  MR Vmean:     409.0 cm/s  MV E velocity: 93.77 cm/s  SHUNTS                             Systemic VTI:  0.10 m                             Systemic Diam: 2.10 cm    CATH (12/2022) Conclusion       Prox RCA to Mid RCA lesion is 70% stenosed.   Dist RCA lesion is 80% stenosed.   RPDA lesion is 99% stenosed.   3rd Mrg lesion is 99% stenosed.   Prox Cx to Mid Cx lesion is 50% stenosed.   Ost LAD to Prox LAD lesion is 60% stenosed.   1st Diag lesion is 99% stenosed.   Mid LAD lesion is 99% stenosed.   Mid LAD to Dist LAD lesion is 40% stenosed.   Severe triple vessel CAD Heavily calcified proximal and mid LAD with moderate proximal stenosis and severe mid vessel stenosis. The Moderate caliber diagonal branch has a severe stenosis.  The circumflex has moderate proximal and mid vessel stenosis. The distal obtuse marginal branch is a small caliber vessel with a severe focal stenosis The large, dominant RCA has diffuse, severe, heavily calcified mid and distal vessel stenosis. The PDA has a severe ostial stenosis Elevated right heart pressures Severe aortic stenosis  (AVA 0.75 cm2. MG 30.9 mmHg)   Recommendations: He has severe three vessel CAD with heavily calcified, diffusely diseased vessels. He also has severe aortic stenosis. If he is felt to be a candidate for surgery, I think he would be best served with CABG/surgical AVR. His aortic arch appears to be calcified on fluoroscopy. I will discharge him home today after bedrest. We will arrange pre TAVR CT scans and have him seen by one of the  CT surgeons on our valve team. If he is not felt to be a candidate for surgery, he would require complex PCI with orbital atherectomy of the RCA and LAD with long segments of stenting followed by TAVR.  Given elevated pressures, will have him increase his Lasix to 40 mg po BID for three days.     Recent Radiology Findings:   CTA (01/2023) FINDINGS: Image quality: Excellent.   Noise artifact is: Limited.   Valve Morphology: Tricuspid aortic valve with diffuse severe calcifications. Acquired fusion of the NCC/LCC. Severely restricted leaflet motion in systole. Incomplete leaflet coaptation consistent with at least moderate AI.   Aortic Valve Calcium score: 2527   Aortic annular dimension:   Phase assessed: 35%   Annular area: 498 mm2   Annular perimeter: 80.5 mm   Max diameter: 28.7 mm   Min diameter: 23.3 mm   Annular and subannular calcification: Minimal calcium under the NCC.   Membranous septum length: 6.9 mm   Optimal coplanar projection: LAO 20 CRA 13   Coronary Artery Height above Annulus:   Left Main: 17.1 mm   Right Coronary: 14.4 mm   Sinus of Valsalva Measurements:   Non-coronary: 36 mm   Right-coronary: 35 mm   Left-coronary: 37 mm   Sinus of Valsalva Height:   Non-coronary: 28.0 mm   Right-coronary: 22.1 mm   Left-coronary: 24.0 mm   Sinotubular Junction: 30 mm   Ascending Thoracic Aorta: 35 mm circumferential calcifications noted.   Coronary Arteries: Normal coronary  origin. Right dominance. The study was performed without use of NTG and is insufficient for plaque evaluation. Please refer to recent cardiac catheterization for coronary assessment. Severe 3-vessel calcifications.   Cardiac Morphology:   Right Atrium: Right atrial size is dilated.   Right Ventricle: The right ventricular cavity is within normal limits.   Left Atrium: Left atrial size is dilated. LAA filling defect noted which is concerning for thrombus.   Left  Ventricle: The ventricular cavity size is within normal limits.   Pulmonary arteries: Dilated pulmonary artery suggestive of pulmonary hypertension.   Pulmonary veins: Normal pulmonary venous drainage.   Pericardium: Normal thickness with no significant effusion or calcium present.   Mitral Valve: The mitral valve is normal structure without significant calcification.   Extra-cardiac findings: Moderate right pleural effusion. Small left pleural effusion. See attached radiology report for non-cardiac structures.   IMPRESSION: 1. Tricuspid aortic valve with severe calcifications and acquired fusion of the NCC/LCC.   2. Annular measurements support a 26 mm S3 or 29 mm Evolut Pro.   3. Minimal annular calcium under the NCC.   4. Sufficient coronary to annulus distance.   5. Optimal Fluoroscopic Angle for Delivery: LAO 20 CRA 13   6. Circumferential calcifications noted in the ascending aorta.   7. LAA filling defect consistent with thrombus.   8. Dilated pulmonary artery suggestive of pulmonary hypertension.   9. Moderate right pleural effusion and small left pleural effusion.       Recent Lab Findings: Recent Labs       Lab Results  Component Value Date    WBC 6.8 01/26/2023    HGB 10.2 (L) 01/28/2023    HGB 11.2 (L) 01/28/2023    HCT 30.0 (L) 01/28/2023    HCT 33.0 (L) 01/28/2023    PLT 124 (L) 01/26/2023    GLUCOSE 73 01/26/2023    CHOL 88 (L) 01/26/2023    TRIG 67 01/26/2023    HDL 33 (L) 01/26/2023    LDLDIRECT 89.6 03/03/2007    LDLCALC 40 01/26/2023    ALT 9 01/26/2023    AST 31 01/26/2023    NA 142 01/28/2023    NA 142 01/28/2023    K 2.6 (LL) 01/28/2023    K 3.1 (L) 01/28/2023    CL 104 01/26/2023    CREATININE 0.94 01/26/2023    BUN 25 01/26/2023    CO2 25 01/26/2023    TSH 2.624 03/22/2019    INR 1.4 (A) 02/03/2023    HGBA1C 5.8 (H) 07/21/2022            Assessment / Plan:     77 yo male with NYHA class 2 symptoms of severe LFLG AS  with depressed LV function and complicated by severe ascending aortic calcification. I would not feel that surgical AVR should be considered with his level of porcelain aorta unless no other options available. He is at extremely high risk for surgical complications of bleeding and CVA. His CAD I dont believe precludes a rapid run of tachycardia to deploy valve and his Left femoral accessible with at most shock wave. His Right carotid also an option. I discussed all of the is with his son and son in law and they understand and will await the TAVR team recommendation after discussion tomorrow. He would not be a bail out candidate for TAVR

## 2023-03-03 NOTE — Anesthesia Procedure Notes (Signed)
Procedure Name: MAC Date/Time: 03/03/2023 9:35 AM  Performed by: Cy Blamer, CRNAPre-anesthesia Checklist: Patient identified, Emergency Drugs available, Suction available, Patient being monitored and Timeout performed Patient Re-evaluated:Patient Re-evaluated prior to induction Oxygen Delivery Method: Simple face mask Preoxygenation: Pre-oxygenation with 100% oxygen Placement Confirmation: positive ETCO2 Dental Injury: Teeth and Oropharynx as per pre-operative assessment

## 2023-03-03 NOTE — Op Note (Signed)
[facility]   MULTIDISCIPLINARY HEART VALVE TEAM   TAVR OPERATIVE NOTE   Date of Procedure:  03/03/2023  Preoperative Diagnosis: Severe Aortic Stenosis   Postoperative Diagnosis: Same   Procedure:   Transcatheter Aortic Valve Replacement - Percutaneous left Transfemoral Approach  Edwards Sapien 3 Ultra THV (size 26 mm, model # [id number])   Co-Surgeons:  Eugenio Hoes MD and Verne Carrow  Anesthesiologist:  Anice Paganini MD  Echocardiographer:  Dr Royann Shivers  Pre-operative Echo Findings: Severe aortic stenosis depressed left ventricular systolic function  Post-operative Echo Findings: no paravalvular leak Depressed left ventricular systolic function   BRIEF CLINICAL NOTE AND INDICATIONS FOR SURGERY  77 yo male with NYHA class 2 symptoms of severe LFLG AS with depressed LV function and complicated by severe ascending aortic calcification. I would not feel that surgical AVR should be considered with his level of porcelain aorta unless no other options available. He is at extremely high risk for surgical complications of bleeding and CVA. His CAD I dont believe precludes a rapid run of tachycardia to deploy valve and his Left femoral accessible with at most shock wave     DETAILS OF THE OPERATIVE PROCEDURE  PREPARATION:    The patient was brought to the operating room on the above mentioned date and appropriate monitoring was established by the anesthesia team. The patient was placed in the supine position on the operating table.  Intravenous antibiotics were administered. The patient was monitored closely throughout the procedure under conscious sedation.    Baseline transthoracic echocardiogram was performed. The patient's abdomen and both groins were prepped and draped in a sterile manner. A time out procedure was performed.   PERIPHERAL ACCESS:    Using the modified Seldinger technique, femoral arterial and venous access was obtained with placement of 6  Fr sheaths on the right side.  A pigtail diagnostic catheter was passed through the right arterial sheath under fluoroscopic guidance into the aortic root.  A temporary transvenous pacemaker catheter was passed through the right femoral venous sheath under fluoroscopic guidance into the right ventricle.  The pacemaker was tested to ensure stable lead placement and pacemaker capture. Aortic root angiography was performed in order to determine the optimal angiographic angle for valve deployment.   TRANSFEMORAL ACCESS:   Percutaneous transfemoral access and sheath placement was performed using ultrasound guidance.  The left common femoral artery was cannulated using a micropuncture needle and appropriate location was verified using hand injection angiogram.  A pair of Abbott Perclose percutaneous closure devices were placed and a 6 French sheath replaced into the femoral artery.  The patient was heparinized systemically and ACT verified > 250 seconds.    A 14 Fr transfemoral E-sheath was introduced into the left common femoral artery after progressively dilating over an Amplatz superstiff wire. There were arterial lesions that kept the sheath from being advanced and shock wave was applied to the left iliac vessel with progressive 6, 7 and 8mm balloons. The sheath was then allowed to pass into the aorta. An AL1 catheter was used to direct a straight-tip exchange length wire across the native aortic valve into the left ventricle. This was exchanged out for a pigtail catheter and position was confirmed in the LV apex. Simultaneous LV and Ao pressures were recorded with a mean gradient of . The pigtail catheter was exchanged for a Safari wire in the LV apex.   BALLOON AORTIC VALVULOPLASTY:   Was not performed   TRANSCATHETER HEART VALVE DEPLOYMENT:  An Edwards Sapien 3 Ultra transcatheter heart valve (size 26 mm) was prepared and crimped per manufacturer's guidelines, and the proper orientation of  the valve is confirmed on the [employer] delivery system. The valve was advanced through the introducer sheath using normal technique until in an appropriate position in the abdominal aorta beyond the sheath tip. The balloon was then retracted and using the fine-tuning wheel was centered on the valve. The valve was then advanced across the aortic arch using appropriate flexion of the catheter. The valve was carefully positioned across the aortic valve annulus. The Commander catheter was retracted using normal technique. Once final position of the valve has been confirmed by angiographic assessment, the valve is deployed during rapid ventricular pacing to maintain systolic blood pressure < 50 mmHg and pulse pressure < 10 mmHg. The balloon inflation is held for >3 seconds after reaching full deployment volume. Once the balloon has fully deflated the balloon is retracted into the ascending aorta and valve function is assessed using echocardiography. There is felt to be np paravalvular leak and no central aortic insufficiency.  The patient's hemodynamic recovery following valve deployment is good.  The deployment balloon and guidewire are both removed.    PROCEDURE COMPLETION:   The sheath was removed and femoral artery closure performed.  Protamine was administered once femoral arterial repair was complete. The temporary pacemaker, pigtail catheter and femoral sheaths were removed with manual pressure used for venous hemostasis. Manual  femoral pressure was  utilized following removal of the diagnostic sheath in the right femoral artery.  The patient tolerated the procedure well and is transported to the cath lab recovery area in stable condition. There were no immediate intraoperative complications. All sponge instrument and needle counts are verified correct at completion of the operation.   No blood products were administered during the operation.  The patient received a total of 60 mL of  intravenous contrast during the procedure.   Eugenio Hoes, MD 03/03/2023 12:18 PM

## 2023-03-03 NOTE — Progress Notes (Signed)
  Echocardiogram 2D Echocardiogram has been performed.  Delcie Roch 03/03/2023, 11:52 AM

## 2023-03-03 NOTE — Transfer of Care (Signed)
Immediate Anesthesia Transfer of Care Note  Patient: [name]  Procedure(s) Performed: Transcatheter Aortic Valve Replacement, Transfemoral (Left) INTRAOPERATIVE TRANSTHORACIC ECHOCARDIOGRAM  Patient Location: Cath Lab  Anesthesia Type:MAC  Level of Consciousness: awake, alert , and oriented  Airway & Oxygen Therapy: Patient Spontanous Breathing and Patient connected to nasal cannula oxygen  Post-op Assessment: Report given to RN, Post -op Vital signs reviewed and stable, Patient moving all extremities X 4, and Patient able to stick tongue midline  Post vital signs: Reviewed and stable  Last Vitals:  Vitals Value Taken Time  BP 89/48   Temp 97.4   Pulse 55 03/03/23 1227  Resp 16 03/03/23 1227  SpO2 91 % 03/03/23 1227  Vitals shown include unfiled device data.  Last Pain:  Vitals:   03/03/23 0725  TempSrc:   PainSc: 0-No pain         Complications: There were no known notable events for this encounter.

## 2023-03-03 NOTE — Interval H&P Note (Signed)
History and Physical Interval Note:  03/03/2023 9:15 AM  [name]  has presented today for surgery, with the diagnosis of Severe Aortic Stenosis.  The various methods of treatment have been discussed with the patient and family. After consideration of risks, benefits and other options for treatment, the patient has consented to  Procedure(s): Transcatheter Aortic Valve Replacement, Transfemoral (Left) INTRAOPERATIVE TRANSTHORACIC ECHOCARDIOGRAM (N/A) as a surgical intervention.  The patient's history has been reviewed, patient examined, no change in status, stable for surgery.  I have reviewed the patient's chart and labs.  Questions were answered to the patient's satisfaction.     Eugenio Hoes

## 2023-03-03 NOTE — Discharge Summary (Addendum)
[facility]   MULTIDISCIPLINARY HEART VALVE TEAM  Discharge Summary    Patient ID: [name] MRN: [id number]; DOB: 02-09-1946  Admit date: 03/03/2023 Discharge date: 03/04/2023  Primary Care Provider: Tyson Alias, MD  Primary Cardiologist: [name], MD / Dr. Clifton James & Dr. Leafy Ro (TAVR)  Discharge Diagnoses    Principal Problem:   S/P TAVR (transcatheter aortic valve replacement) Active Problems:   Hyperlipidemia   CAD (coronary artery disease)   Essential hypertension   Persistent atrial fibrillation (HCC)   Emphysema of lung (HCC)   Allergies No Known Allergies  Diagnostic Studies/Procedures    [facility]  TAVR OPERATIVE NOTE     Date of Procedure:                03/03/2023   Preoperative Diagnosis:      Severe Aortic Stenosis    Postoperative Diagnosis:    Same    Procedure:        Transcatheter Aortic Valve Replacement - Transfemoral Approach             Edwards Sapien 3 THV (size 26 mm, model # [id number], serial # [id number] )              Co-Surgeons:                        Verne Carrow, MD and Eugenio Hoes , MD    Anesthesiologist:                  Charlynn Grimes   Echocardiographer:              Croitoru   Pre-operative Echo Findings: Severe aortic stenosis Normal left ventricular systolic function   Post-operative Echo Findings: No paravalvular leak Normal left ventricular systolic function _____________    Echo 03/04/23: completed but pending formal read at the time of discharge   History of Present Illness     [name] is a 77 y.o. male with a history of CAD, PAD, cardiomyopathy, PAF on Coumadin (DOAC cost prohibitive), HLD, HTN, cholecystitis, bladder rupture, and severe LFLG aortic stenosis who presented to [facility] on 03/03/23 for planned TAVR.   Pt has known cardiac history of CAD from cath in 2007 and then PAF that began in 2020 treated with cardioversions and anticoagulation. Pt had a  difficult time in 05/2022 from cholecystitis and complicated with sepsis and bladder rupture but had recovered. He developed fluid retention and increasing DOE and was treated with lasix with improvement. Echo 12/2022 with LVEF=35% with global hypokinesis, mildly reduced RV function, mild to moderate MR, and severe low flow/low gradient aortic stenosis with AVA 0.64, mean gradient 20 mmHg, DVI 0.18. Cardiac cath 01/28/23 with three vessel CAD. We discussed his echo and cath findings in our Structural Heart Meeting and elected to treat his CAD conservatively for now and proceed with TAVR, which was set up for 03/03/23.  Hospital Course     Consultants: none   Severe AS: s/p successful TAVR with a 26 mm Edwards Sapien 3 Ultra Resilia THV via the left TF approach using intravascular lithotripsy on 03/03/23. Post operative echo completed but pending formal read. Groin sites are stable. ECG with afib and no HAVB. Resumed on home Coumadin and aspirin. Walked with cardiac rehab with no issues. Plan for discharge home today with close follow up in the outpatient setting.   CAD: not a candidate for surgical CABG/AVR given calcified aorta  and other comorbidities. Cath 01/28/23 showed severe triple vessel CAD. If he develops chest pain, he would require complex PCI with orbital atherectomy of the RCA and LAD with long segments of stenting. Continue medical therapy for now with aspirin, statin and beta blocker.   PAD: noted on pre TAVR scans. We were able to obtain TF access during TAVR using Shockwave  intravascular lithotripsy. Continue medical therapy.   HTN: BP well controlled. Resumed on home meds.   PAF: maintained sinus rhythm during admission. Resumed on home Coumadin with Lovenox bridge. INR apt 9/9.  HFrEF: he is well compensated. LVEDP 14 at the time of TAVR. Continue GDMT with Entresto 24-26mg  BID, Farixga 10mg  daily, Coreg 6.25 mg BID, Spiro 12.5mg  daily and PRN lasix.  LAA filling defect: noted on pre  TAVR CT. He was bridged with Lovenox preoperatively. Resumed on home Coumadin/Lovenox bridge post operatively.   _____________  Discharge Vitals Blood pressure (!) 104/51, pulse 78, temperature 98.1 F (36.7 C), temperature source Oral, resp. rate 19, height 5\' 6"  (1.676 m), weight 62.9 kg, SpO2 96%.  Filed Weights   03/03/23 0659 03/04/23 0452  Weight: 62.1 kg 62.9 kg    GEN: Well nourished, well developed, in no acute distress HEENT: normal Neck: no JVD or masses Cardiac: irreg irreg; no murmurs, rubs, or gallops,no edema  Respiratory:  clear to auscultation bilaterally, normal work of breathing GI: soft, nontender, nondistended, + BS MS: no deformity or atrophy Skin: warm and dry, no rash Neuro:  Alert and Oriented x 3, Strength and sensation are intact Psych: euthymic mood, full affect   Labs & Radiologic Studies    CBC Recent Labs    03/03/23 1158 03/04/23 0344  WBC  --  10.5  HGB 10.2* 10.3*  HCT 30.0* 30.8*  MCV  --  91.1  PLT  --  97*   Basic Metabolic Panel Recent Labs    16/10/96 0900 03/03/23 1158 03/04/23 0344  NA 135 139 133*  K 4.4 4.1 4.0  CL 106 105 106  CO2 13*  --  18*  GLUCOSE 54* 143* 102*  BUN 11 16 18   CREATININE 0.46* 0.60* 0.82  CALCIUM 8.2*  --  8.6*  MG  --   --  1.8   Liver Function Tests No results for input(s): "AST", "ALT", "ALKPHOS", "BILITOT", "PROT", "ALBUMIN" in the last 72 hours. No results for input(s): "LIPASE", "AMYLASE" in the last 72 hours. Cardiac Enzymes No results for input(s): "CKTOTAL", "CKMB", "CKMBINDEX", "TROPONINI" in the last 72 hours. BNP Invalid input(s): "POCBNP" D-Dimer No results for input(s): "DDIMER" in the last 72 hours. Hemoglobin A1C No results for input(s): "HGBA1C" in the last 72 hours. Fasting Lipid Panel No results for input(s): "CHOL", "HDL", "LDLCALC", "TRIG", "CHOLHDL", "LDLDIRECT" in the last 72 hours. Thyroid Function Tests No results for input(s): "TSH", "T4TOTAL", "T3FREE",  "THYROIDAB" in the last 72 hours.  Invalid input(s): "FREET3" _____________  ECHOCARDIOGRAM LIMITED  Result Date: 03/03/2023    ECHOCARDIOGRAM LIMITED REPORT   Patient Name:   [name] Date of Exam: 03/03/2023 Medical Rec #:  [id number]         Height:       66.0 in Accession #:    [id number]        Weight:       137.0 lb Date of Birth:  1946-01-11         BSA:          1.703 m Patient Age:  77 years          BP:           154/74 mmHg Patient Gender: M                 HR:           66 bpm. Exam Location:  Inpatient Procedure: Limited Color Doppler, Cardiac Doppler and Limited Echo Indications:    Aortic stenosis. TAVR.  History:        Patient has prior history of Echocardiogram examinations, most                 recent 01/12/2023. CAD, Arrythmias:Atrial Fibrillation; Risk                 Factors:Hypertension, Dyslipidemia and Former Smoker.                 Aortic Valve: 26 mm Edwards Ultra, stented (TAVR) valve is                 present in the aortic position. Procedure Date: 03/03/23.  Sonographer:    Delcie Roch RDCS Referring Phys: 17 Kaavya Puskarich D Ritu Gagliardo                  PRE-PROCEDURAL FINDINGS:                  Mildly-moderately reducedleft ventricular systolic function.                 Estimated LVEF 40%,                 There are no regional wall motion abnormalities.                 Severe (low flow, low gradient) calcific aortic stenosis.                 Trileaflet aortic valve.                 Peak aortic valve gradient 37 mm Hg, mean gradient 23 mm Hg.                 Dimensionless obstructive index 0.19, calculated aortic valve                 area is 0.54 cm (index for BSA 0.32 cm/m).                 Mild aortic insufficiency.                 Mild-moderate mitral insufficiency.                 No pericardial effusion.                  POST-PROCEDURAL FINDINGS:                  Improved left ventricular systolic function. Estimated LVEF                 45-50%.                  There are no regional wall motion abnormalities.                 Well-seated TAVR stent-valve.                 Peak aortic valve gradient 5 mm Hg, mean gradient 2 mm Hg.  Dimensionless obstructive index 0.54, calculated aortic valve                 area is 1.66 cm (index for BSA 0.98 cm/m), acceleration time                 120 ms.                 No aortic insufficiency/perivalvular leak.                 Unchanged mild-moderate mitral insufficiency.                 No pericardial effusion. IMPRESSIONS  1. Left ventricular ejection fraction, by estimation, is 40 to 45%. The left ventricle has mildly decreased function. Left ventricular diastolic function could not be evaluated.  2. Left atrial size was moderately dilated.  3. Mild to moderate mitral valve regurgitation. No evidence of mitral stenosis. Moderate mitral annular calcification.  4. Tricuspid valve regurgitation is mild to moderate.  5. The aortic valve has been repaired/replaced. There is a 26 mm Edwards Ultra, stented (TAVR) valve present in the aortic position. Procedure Date: 03/03/23. Aortic valve mean gradient measures 12.0 mmHg. Aortic valve Vmax measures 2.07 m/s. Aortic valve  acceleration time measures 120 msec.  6. There is severely elevated pulmonary artery systolic pressure. The estimated right ventricular systolic pressure is 60.7 mmHg. (pre-TAVR) FINDINGS  Left Ventricle: Left ventricular ejection fraction, by estimation, is 40 to 45%. The left ventricle has mildly decreased function. The left ventricular internal cavity size was normal in size. There is no left ventricular hypertrophy. Left ventricular diastolic function could not be evaluated. Right Ventricle: There is severely elevated pulmonary artery systolic pressure. The tricuspid regurgitant velocity is 3.73 m/s, and with an assumed right atrial pressure of 5 mmHg, the estimated right ventricular systolic pressure is 60.7 mmHg. Left Atrium: Left atrial size was  moderately dilated. Mitral Valve: Moderate mitral annular calcification. Mild to moderate mitral valve regurgitation, with eccentric laterally directed jet. No evidence of mitral valve stenosis. Tricuspid Valve: The tricuspid valve is normal in structure. Tricuspid valve regurgitation is mild to moderate. Aortic Valve: The aortic valve has been repaired/replaced. Aortic regurgitation PHT measures 404 msec. Aortic valve mean gradient measures 12.0 mmHg. Aortic valve peak gradient measures 17.1 mmHg. Aortic valve area, by VTI measures 0.84 cm. There is a 26 mm Edwards Ultra, stented (TAVR) valve present in the aortic position. Procedure Date: 03/03/23. Pulmonic Valve: The pulmonic valve was not well visualized. Pulmonic valve regurgitation is not visualized. Aorta: The aortic root and ascending aorta are structurally normal, with no evidence of dilitation. Venous: The inferior vena cava was not well visualized. Additional Comments: Spectral Doppler performed. Color Doppler performed.  LEFT VENTRICLE PLAX 2D LVIDd:         4.20 cm LVIDs:         3.40 cm LV PW:         1.00 cm LV IVS:        1.00 cm LVOT diam:     1.90 cm LV SV:         42 LV SV Index:   25 LVOT Area:     2.84 cm  AORTIC VALVE AV Area (Vmax):    0.82 cm AV Area (Vmean):   0.81 cm AV Area (VTI):     0.84 cm AV Vmax:           206.50 cm/s AV Vmean:  141.550 cm/s AV VTI:            0.502 m AV Peak Grad:      17.1 mmHg AV Mean Grad:      12.0 mmHg LVOT Vmax:         59.65 cm/s LVOT Vmean:        40.225 cm/s LVOT VTI:          0.148 m LVOT/AV VTI ratio: 0.30 AI PHT:            404 msec  AORTA Ao Root diam: 3.50 cm Ao Asc diam:  3.10 cm TRICUSPID VALVE TR Peak grad:   55.7 mmHg TR Vmax:        373.00 cm/s  SHUNTS Systemic VTI:  0.15 m Systemic Diam: 1.90 cm Thurmon Fair MD Electronically signed by Thurmon Fair MD Signature Date/Time: 03/03/2023/5:46:59 PM    Final    Structural Heart Procedure  Result Date: 03/03/2023 See surgical note for  result.  CT CORONARY MORPH W/CTA COR W/SCORE W/CA W/CM &/OR WO/CM  Addendum Date: 02/03/2023   ADDENDUM REPORT: 02/03/2023 17:39 EXAM: OVER-READ INTERPRETATION  CT CHEST The following report is an over-read performed by radiologist Dr. Jacob Moores [facility] on 02/03/2023. This over-read does not include interpretation of cardiac or coronary anatomy or pathology. The cardiac TAVR interpretation by the cardiologist is attached. COMPARISON:  None. FINDINGS: Extracardiac findings will be described separately under dictation for contemporaneously obtained CTA chest, abdomen and pelvis. IMPRESSION: Please see separate dictation for contemporaneously obtained CTA chest, abdomen and pelvis dated 02/03/2023 for full description of relevant extracardiac findings. Electronically Signed   By: Allegra Lai M.D.   On: 02/03/2023 17:39   Result Date: 02/03/2023 CLINICAL DATA:  Severe Aortic Stenosis. EXAM: Cardiac TAVR CT TECHNIQUE: A non-contrast, gated CT scan was obtained with axial slices of 3 mm through the heart for aortic valve calcium scoring. A 120 kV retrospective, gated, contrast cardiac scan was obtained. Gantry rotation speed was 250 msecs and collimation was 0.6 mm. Nitroglycerin was not given. The 3D data set was reconstructed in 5% intervals of the 0-95% of the R-R cycle. Systolic and diastolic phases were analyzed on a dedicated workstation using MPR, MIP, and VRT modes. The patient received 100 cc of contrast. FINDINGS: Image quality: Excellent. Noise artifact is: Limited. Valve Morphology: Tricuspid aortic valve with diffuse severe calcifications. Acquired fusion of the NCC/LCC. Severely restricted leaflet motion in systole. Incomplete leaflet coaptation consistent with at least moderate AI. Aortic Valve Calcium score: 2527 Aortic annular dimension: Phase assessed: 35% Annular area: 498 mm2 Annular perimeter: 80.5 mm Max diameter: 28.7 mm Min diameter: 23.3 mm Annular and subannular  calcification: Minimal calcium under the NCC. Membranous septum length: 6.9 mm Optimal coplanar projection: LAO 20 CRA 13 Coronary Artery Height above Annulus: Left Main: 17.1 mm Right Coronary: 14.4 mm Sinus of Valsalva Measurements: Non-coronary: 36 mm Right-coronary: 35 mm Left-coronary: 37 mm Sinus of Valsalva Height: Non-coronary: 28.0 mm Right-coronary: 22.1 mm Left-coronary: 24.0 mm Sinotubular Junction: 30 mm Ascending Thoracic Aorta: 35 mm circumferential calcifications noted. Coronary Arteries: Normal coronary origin. Right dominance. The study was performed without use of NTG and is insufficient for plaque evaluation. Please refer to recent cardiac catheterization for coronary assessment. Severe 3-vessel calcifications. Cardiac Morphology: Right Atrium: Right atrial size is dilated. Right Ventricle: The right ventricular cavity is within normal limits. Left Atrium: Left atrial size is dilated. LAA filling defect noted which is concerning for thrombus. Left Ventricle: The  ventricular cavity size is within normal limits. Pulmonary arteries: Dilated pulmonary artery suggestive of pulmonary hypertension. Pulmonary veins: Normal pulmonary venous drainage. Pericardium: Normal thickness with no significant effusion or calcium present. Mitral Valve: The mitral valve is normal structure without significant calcification. Extra-cardiac findings: Moderate right pleural effusion. Small left pleural effusion. See attached radiology report for non-cardiac structures. IMPRESSION: 1. Tricuspid aortic valve with severe calcifications and acquired fusion of the NCC/LCC. 2. Annular measurements support a 26 mm S3 or 29 mm Evolut Pro. 3. Minimal annular calcium under the NCC. 4. Sufficient coronary to annulus distance. 5. Optimal Fluoroscopic Angle for Delivery: LAO 20 CRA 13 6. Circumferential calcifications noted in the ascending aorta. 7. LAA filling defect consistent with thrombus. 8. Dilated pulmonary artery suggestive  of pulmonary hypertension. 9. Moderate right pleural effusion and small left pleural effusion. Gerri [facility]ore T. Flora Lipps, MD Electronically Signed: By: Lennie Odor M.D. On: 02/03/2023 17:34   CT ANGIO ABDOMEN PELVIS  W & WO CONTRAST  Result Date: 02/03/2023 CLINICAL DATA:  Aortic valve replacement preop evaluation EXAM: CT ANGIOGRAPHY CHEST, ABDOMEN AND PELVIS TECHNIQUE: Non-contrast CT of the chest was initially obtained. Multidetector CT imaging through the chest, abdomen and pelvis was performed using the standard protocol during bolus administration of intravenous contrast. Multiplanar reconstructed images and MIPs were obtained and reviewed to evaluate the vascular anatomy. RADIATION DOSE REDUCTION: This exam was performed according to the departmental dose-optimization program which includes automated exposure control, adjustment of the mA and/or kV according to patient size and/or use of iterative reconstruction technique. CONTRAST:  OMNIPAQUE IOHEXOL 350 MG/ML SOLN COMPARISON:  None Available. FINDINGS: Skull base and neck: Complete opacification of the left maxillary sinus CTA CHEST FINDINGS Cardiovascular: Cardiomegaly. No pericardial effusion. Normal caliber thoracic aorta with severe atherosclerotic disease. Aortic valve thickening and calcifications. Severe left main and three-vessel coronary artery calcifications. Mediastinum/Nodes: Esophagus and thyroid are unremarkable. No enlarged lymph nodes seen in the chest. Lungs/Pleura: Central airways are patent. Right middle lobe atelectasis. Moderate right and small left pleural effusions. Musculoskeletal: Bilateral total shoulder arthroplasties. No aggressive appearing osseous lesions. CTA ABDOMEN AND PELVIS FINDINGS Hepatobiliary: No focal liver abnormality is seen. Status post cholecystectomy. No biliary dilatation. Pancreas: Unremarkable. No pancreatic ductal dilatation or surrounding inflammatory changes. Spleen: Normal in size without focal  abnormality. Adrenals/Urinary Tract: Bilateral adrenal glands are unremarkable. No hydronephrosis or nephrolithiasis. Numerous bilateral low-attenuation renal lesions, largest are compatible with simple cysts, others are too small to accurately characterize, no specific follow-up imaging is necessary. Bladder is unremarkable. Stomach/Bowel: Stomach is within normal limits. Appendix appears normal. Small duodenal diverticulum. Severe diverticulosis of the sigmoid colon. No evidence of bowel wall thickening, distention, or inflammatory changes. Vascular/lymphatic: Normal caliber abdominal aorta with severe atherosclerotic disease. No enlarged lymph nodes seen in the abdomen or pelvis. Reproductive: Prostatomegaly. Other: Small locules of gas seen in the right anterior abdominal wall. No abdominopelvic ascites. Musculoskeletal: Moderate to severe degenerative disc disease of the lumbar spine. No acute or significant osseous findings. VASCULAR MEASUREMENTS PERTINENT TO TAVR: AORTA: Minimal Aortic Diameter-12.1 mm Severity of Aortic Calcification-severe RIGHT PELVIS: Right Common Iliac Artery - Minimal Diameter-4.3 mm Tortuosity-moderate Calcification-severe Right External Iliac Artery - Minimal Diameter-3.1 mm Tortuosity-severe Calcification-severe Right Common Femoral Artery - Minimal Diameter-4.8 mm Tortuosity-mild Calcification-severe LEFT PELVIS: Left Common Iliac Artery - Minimal Diameter-5.1 mm Tortuosity-mild Calcification-severe Left External Iliac Artery - Minimal Diameter-4.2 mm Tortuosity-moderate Calcification-severe Left Common Femoral Artery - Minimal Diameter-4.3 mm Tortuosity-none Calcification-severe Review of the MIP images confirms the above  findings. IMPRESSION: Vascular: 1. Vascular findings and measurements pertinent to potential TAVR procedure, as detailed above. 2. Thickening and calcification of the aortic valve, compatible with reported clinical history of aortic stenosis. 3. Severe aortoiliac  atherosclerosis. Left main and 3 vessel coronary artery disease. Nonvascular: 1. Small locules of gas seen in the right anterior abdominal wall, likely due to subcutaneous injection. Recommend clinical correlation. 2. Moderate right and small left pleural effusions right middle lobe atelectasis. 3. Complete opacification of the left maxillary sinus, likely due to chronic sinusitis. Electronically Signed   By: Allegra Lai M.D.   On: 02/03/2023 17:34   CT ANGIO CHEST AORTA W/CM & OR WO/CM  Result Date: 02/03/2023 CLINICAL DATA:  Aortic valve replacement preop evaluation EXAM: CT ANGIOGRAPHY CHEST, ABDOMEN AND PELVIS TECHNIQUE: Non-contrast CT of the chest was initially obtained. Multidetector CT imaging through the chest, abdomen and pelvis was performed using the standard protocol during bolus administration of intravenous contrast. Multiplanar reconstructed images and MIPs were obtained and reviewed to evaluate the vascular anatomy. RADIATION DOSE REDUCTION: This exam was performed according to the departmental dose-optimization program which includes automated exposure control, adjustment of the mA and/or kV according to patient size and/or use of iterative reconstruction technique. CONTRAST:  OMNIPAQUE IOHEXOL 350 MG/ML SOLN COMPARISON:  None Available. FINDINGS: Skull base and neck: Complete opacification of the left maxillary sinus CTA CHEST FINDINGS Cardiovascular: Cardiomegaly. No pericardial effusion. Normal caliber thoracic aorta with severe atherosclerotic disease. Aortic valve thickening and calcifications. Severe left main and three-vessel coronary artery calcifications. Mediastinum/Nodes: Esophagus and thyroid are unremarkable. No enlarged lymph nodes seen in the chest. Lungs/Pleura: Central airways are patent. Right middle lobe atelectasis. Moderate right and small left pleural effusions. Musculoskeletal: Bilateral total shoulder arthroplasties. No aggressive appearing osseous lesions. CTA  ABDOMEN AND PELVIS FINDINGS Hepatobiliary: No focal liver abnormality is seen. Status post cholecystectomy. No biliary dilatation. Pancreas: Unremarkable. No pancreatic ductal dilatation or surrounding inflammatory changes. Spleen: Normal in size without focal abnormality. Adrenals/Urinary Tract: Bilateral adrenal glands are unremarkable. No hydronephrosis or nephrolithiasis. Numerous bilateral low-attenuation renal lesions, largest are compatible with simple cysts, others are too small to accurately characterize, no specific follow-up imaging is necessary. Bladder is unremarkable. Stomach/Bowel: Stomach is within normal limits. Appendix appears normal. Small duodenal diverticulum. Severe diverticulosis of the sigmoid colon. No evidence of bowel wall thickening, distention, or inflammatory changes. Vascular/lymphatic: Normal caliber abdominal aorta with severe atherosclerotic disease. No enlarged lymph nodes seen in the abdomen or pelvis. Reproductive: Prostatomegaly. Other: Small locules of gas seen in the right anterior abdominal wall. No abdominopelvic ascites. Musculoskeletal: Moderate to severe degenerative disc disease of the lumbar spine. No acute or significant osseous findings. VASCULAR MEASUREMENTS PERTINENT TO TAVR: AORTA: Minimal Aortic Diameter-12.1 mm Severity of Aortic Calcification-severe RIGHT PELVIS: Right Common Iliac Artery - Minimal Diameter-4.3 mm Tortuosity-moderate Calcification-severe Right External Iliac Artery - Minimal Diameter-3.1 mm Tortuosity-severe Calcification-severe Right Common Femoral Artery - Minimal Diameter-4.8 mm Tortuosity-mild Calcification-severe LEFT PELVIS: Left Common Iliac Artery - Minimal Diameter-5.1 mm Tortuosity-mild Calcification-severe Left External Iliac Artery - Minimal Diameter-4.2 mm Tortuosity-moderate Calcification-severe Left Common Femoral Artery - Minimal Diameter-4.3 mm Tortuosity-none Calcification-severe Review of the MIP images confirms the above  findings. IMPRESSION: Vascular: 1. Vascular findings and measurements pertinent to potential TAVR procedure, as detailed above. 2. Thickening and calcification of the aortic valve, compatible with reported clinical history of aortic stenosis. 3. Severe aortoiliac atherosclerosis. Left main and 3 vessel coronary artery disease. Nonvascular: 1. Small locules of gas seen in the  right anterior abdominal wall, likely due to subcutaneous injection. Recommend clinical correlation. 2. Moderate right and small left pleural effusions right middle lobe atelectasis. 3. Complete opacification of the left maxillary sinus, likely due to chronic sinusitis. Electronically Signed   By: Allegra Lai M.D.   On: 02/03/2023 17:34   Disposition   Pt is being discharged home today in good condition.  Follow-up Plans & Appointments     Follow-up Information     Filbert Schilder, NP. Go on 03/11/2023.   Specialty: Cardiology Why: @ 9:55am, please arrive at least 10 minutes early. Contact information: [address] [place] [address] [phone]                  Discharge Medications   Allergies as of 03/04/2023   No Known Allergies      Medication List     TAKE these medications    Acetaminophen Extra Strength 500 MG Tabs Take 2 tablets (1,000 mg total) by mouth 4 (four) times daily. What changed: when to take this   aspirin EC 81 MG tablet Take 1 tablet (81 mg total) by mouth daily. Swallow whole.   atorvastatin 40 MG tablet Commonly known as: LIPITOR TAKE 1 TABLET EVERY DAY   carvedilol 6.25 MG tablet Commonly known as: COREG TAKE 1 TABLET TWICE DAILY WITH MEALS (NEED MD APPOINTMENT)   dapagliflozin propanediol 10 MG Tabs tablet Commonly known as: Farxiga Take 1 tablet (10 mg total) by mouth daily before breakfast.   docusate sodium 100 MG capsule Commonly known as: COLACE Take 1 capsule (100 mg total) by mouth 2 (two) times daily.   enoxaparin 100 MG/ML  injection Commonly known as: LOVENOX Inject 1 mL (100 mg total) into the skin daily.   enoxaparin 100 MG/ML injection Commonly known as: LOVENOX Inject 1 mL (100 mg total) into the skin daily.   finasteride 5 MG tablet Commonly known as: PROSCAR Take 1 tablet (5 mg total) by mouth daily.   fluticasone 50 MCG/ACT nasal spray Commonly known as: FLONASE Place 2 sprays into both nostrils daily as needed for allergies or rhinitis.   furosemide 20 MG tablet Commonly known as: LASIX 1 tablet daily as needed for weight gain of 3 lbs in one day   loratadine 10 MG tablet Commonly known as: CLARITIN Take 10 mg by mouth daily as needed (wheezing).   potassium chloride SA 20 MEQ tablet Commonly known as: KLOR-CON M Take 1 tablet (20 mEq total) by mouth daily.   sacubitril-valsartan 24-26 MG Commonly known as: ENTRESTO Take 1 tablet by mouth 2 (two) times daily.   senna-docusate 8.6-50 MG tablet Commonly known as: Senokot-S Take 1 tablet by mouth 2 (two) times daily.   silodosin 8 MG Caps capsule Commonly known as: RAPAFLO Take 1 capsule (8 mg total) by mouth daily with breakfast. What changed:  when to take this reasons to take this   spironolactone 25 MG tablet Commonly known as: ALDACTONE Take 0.5 tablets (12.5 mg total) by mouth daily.   warfarin 2.5 MG tablet Commonly known as: COUMADIN Take as directed. If you are unsure how to take this medication, talk to your nurse or doctor. Original instructions: TAKE 1/2 TO 1 TABLET DAILY AS DIRECTED BY THE COUMADIN CLINIC         Outstanding Labs/Studies   none  Duration of Discharge Encounter   Greater than 30 minutes including physician time.  Byrd Hesselbach, PA-C 03/04/2023, 9:07 AM [phone]  I have personally seen and examined this patient. I agree with the assessment and plan as outlined above.  Pt doing well today. Groins stable. Atrial fib, rate controlled. Labs ok. Discharge home today.    Verne Carrow, MD, [facility] 03/04/2023 9:16 AM

## 2023-03-03 NOTE — Progress Notes (Signed)
  HEART AND VASCULAR CENTER   MULTIDISCIPLINARY HEART VALVE TEAM  Patient doing well s/p TAVR. He is hemodynamically stable. Groin sites stable. ECG with atrial fib with no high grade block. Transferred to 4E. Plan for early ambulation after bedrest completed and hopeful discharge over the next 24-48 hours.   Cline Crock PA-C  [facility]  Pager [phone]

## 2023-03-03 NOTE — Progress Notes (Signed)
Site area- femoral and venous sheath right groin  Site Prior to Removal- level 0   Pressure Applied For-  35 MInutes   Bedrest Beginning at - 1400   Manual- Yes   Patient Status During Pull- Stable    Post Pull Groin Site- level 0   Post Pull Instructions Given- Yes   Post Pull Pulses Present- yes    Dressing Applied- Tegaderm and Gauze Dressing    Comments:  pulled by Eunice Blase RN

## 2023-03-03 NOTE — CV Procedure (Signed)
HEART AND VASCULAR CENTER  TAVR OPERATIVE NOTE   Date of Procedure:  03/03/2023  Preoperative Diagnosis: Severe Aortic Stenosis   Postoperative Diagnosis: Same   Procedure:   Transcatheter Aortic Valve Replacement - Transfemoral Approach  Edwards Sapien 3 THV (size 26 mm, model # [id number], serial # [id number] )   Co-Surgeons:  Verne Carrow, MD and Eugenio Hoes , MD   Anesthesiologist:  Charlynn Grimes  Echocardiographer:  Croitoru  Pre-operative Echo Findings: Severe aortic stenosis Normal left ventricular systolic function  Post-operative Echo Findings: No paravalvular leak Normal left ventricular systolic function  BRIEF CLINICAL NOTE AND INDICATIONS FOR SURGERY  77 yo male with history of CAD, HTN, hyperlipidemia, paroxysmal atrial fibrillation, cardiomyopathy and severe low flow/low gradient aortic stenosis who is here today for TAVR.  Echo July 2024 with LVEF=35% with global hypokinesis. Mildly reduced RV function. Mild to moderate mitral valve regurgitation. Severe low flow/low gradient aortic stenosis with AVA 0.64, mean gradient 20 mmHg, SVI 19, dimensionless index 0.18. Cardiac cath 01/28/23 with three vessel CAD. We discussed his echo and cath findings in our Structural Heat Meeting and elected to treat his CAD conservatively for now and proceed with TAVR. Cardiac CT with findings suitable for a 26 mm Edwards Sapien 3 valve   During the course of the patient's preoperative work up they have been evaluated comprehensively by a multidisciplinary team of specialists coordinated through the Multidisciplinary Heart Valve Clinic in the [facility].  They have been demonstrated to suffer from symptomatic severe aortic stenosis as noted above. The patient has been counseled extensively as to the relative risks and benefits of all options for the treatment of severe aortic stenosis including long term medical therapy, conventional surgery for aortic valve  replacement, and transcatheter aortic valve replacement.  The patient has been independently evaluated by Dr. Leafy Ro with CT surgery and they are felt to be at high risk for conventional surgical aortic valve replacement. The surgeon indicated the patient would be a poor candidate for conventional surgery. Based upon review of all of the patient's preoperative diagnostic tests they are felt to be candidate for transcatheter aortic valve replacement using the transfemoral approach as an alternative to high risk conventional surgery.    Following the decision to proceed with transcatheter aortic valve replacement, a discussion has been held regarding what types of management strategies would be attempted intraoperatively in the event of life-threatening complications, including whether or not the patient would be considered a candidate for the use of cardiopulmonary bypass and/or conversion to open sternotomy for attempted surgical intervention.  The patient has been advised of a variety of complications that might develop peculiar to this approach including but not limited to risks of death, stroke, paravalvular leak, aortic dissection or other major vascular complications, aortic annulus rupture, device embolization, cardiac rupture or perforation, acute myocardial infarction, arrhythmia, heart block or bradycardia requiring permanent pacemaker placement, congestive heart failure, respiratory failure, renal failure, pneumonia, infection, other late complications related to structural valve deterioration or migration, or other complications that might ultimately cause a temporary or permanent loss of functional independence or other long term morbidity.  The patient provides full informed consent for the procedure as described and all questions were answered preoperatively.    DETAILS OF THE OPERATIVE PROCEDURE  PREPARATION:   The patient is brought to the operating room on the above mentioned date and  central monitoring was established by the anesthesia team including placement of a radial arterial line. The patient  is placed in the supine position on the operating table.  Intravenous antibiotics are administered. Conscious sedation is used.   Baseline transthoracic echocardiogram was performed. The patient's chest, abdomen, both groins, and both lower extremities are prepared and draped in a sterile manner. A time out procedure is performed.   PERIPHERAL ACCESS:   Using the modified Seldinger technique, femoral arterial and venous access were obtained with placement of a 6 Fr sheath in the artery and a 7 Fr sheath in the vein on the right side using u/s guidance.  A pigtail diagnostic catheter was passed through the femoral arterial sheath under fluoroscopic guidance into the aortic root.  A temporary transvenous pacemaker catheter was passed through the femoral venous sheath under fluoroscopic guidance into the right ventricle.  The pacemaker was tested to ensure stable lead placement and pacemaker capture. Aortic root angiography was performed in order to determine the optimal angiographic angle for valve deployment.  TRANSFEMORAL ACCESS:  A micropuncture kit was used to gain access to the left femoral artery using u/s guidance. Position confirmed with angiography. Pre-closure with double ProGlide closure devices. The patient was heparinized systemically and ACT verified > 250 seconds.    There was difficulty placing the sheath due to the severe calcification of the left common iliac, external iliac and common femoral arteries. The iliac system was treated progressively with a 6 mm Shockwave balloon followed by a 7 mm Shockwave balloon using intravascular lithotripsy. I was still unable to pass the sheath into the distal aorta. A 8 mm Shockwave balloon was then used to treat the common iliac artery with intravascular lithotripsy. A 14 Fr transfemoral E-sheath was introduced into the left femoral  artery and passed into the distal aorta. An AL-2 catheter was used to direct a J wire across the native aortic valve into the left ventricle. This was exchanged out for a pigtail catheter and position was confirmed in the LV apex. Simultaneous LV and Ao pressures were recorded.  The pigtail catheter was then exchanged for a Safari wire in the LV apex.   TRANSCATHETER HEART VALVE DEPLOYMENT:  An Edwards Sapien 3 THV (size 26 mm) was prepared and crimped per manufacturer's guidelines, and the proper orientation of the valve is confirmed on the [employer] delivery system. The valve was advanced through the introducer sheath using normal technique until in an appropriate position in the abdominal aorta beyond the sheath tip. The balloon was then retracted and using the fine-tuning wheel was centered on the valve. The valve was then advanced across the aortic arch using appropriate flexion of the catheter. The valve was carefully positioned across the aortic valve annulus. The Commander catheter was retracted using normal technique. Once final position of the valve has been confirmed by angiographic assessment, the valve is deployed while temporarily holding ventilation and during rapid ventricular pacing to maintain systolic blood pressure < 50 mmHg and pulse pressure < 10 mmHg. The balloon inflation is held for >3 seconds after reaching full deployment volume. Once the balloon has fully deflated the balloon is retracted into the ascending aorta and valve function is assessed using TTE. There is felt to be no paravalvular leak and no central aortic insufficiency.  The patient's hemodynamic recovery following valve deployment is good.  The deployment balloon and guidewire are both removed. Echo demostrated acceptable post-procedural gradients, stable mitral valve function, and no AI.   PROCEDURE COMPLETION:  The sheath was then removed and closure devices were completed. Protamine was administered once  femoral arterial repair was complete. The temporary pacemaker, pigtail catheters and femoral sheaths were removed and manual pressure was used for venous hemostasis.    The patient tolerated the procedure well and is transported to the surgical intensive care in stable condition. There were no immediate intraoperative complications. All sponge instrument and needle counts are verified correct at completion of the operation.   No blood products were administered during the operation.  The patient received a total of 68 mL of intravenous contrast during the procedure.  LVEDP: 14 mm Hg  Verne Carrow MD, [facility] 03/03/2023 12:20 PM

## 2023-03-04 ENCOUNTER — Inpatient Hospital Stay (HOSPITAL_COMMUNITY): Payer: Medicare HMO

## 2023-03-04 DIAGNOSIS — Z952 Presence of prosthetic heart valve: Secondary | ICD-10-CM

## 2023-03-04 DIAGNOSIS — I350 Nonrheumatic aortic (valve) stenosis: Secondary | ICD-10-CM | POA: Diagnosis not present

## 2023-03-04 DIAGNOSIS — I4819 Other persistent atrial fibrillation: Secondary | ICD-10-CM | POA: Diagnosis not present

## 2023-03-04 DIAGNOSIS — Z23 Encounter for immunization: Secondary | ICD-10-CM | POA: Diagnosis present

## 2023-03-04 DIAGNOSIS — Z006 Encounter for examination for normal comparison and control in clinical research program: Secondary | ICD-10-CM | POA: Diagnosis not present

## 2023-03-04 LAB — BASIC METABOLIC PANEL
Anion gap: 9 (ref 5–15)
BUN: 18 mg/dL (ref 8–23)
CO2: 18 mmol/L — ABNORMAL LOW (ref 22–32)
Calcium: 8.6 mg/dL — ABNORMAL LOW (ref 8.9–10.3)
Chloride: 106 mmol/L (ref 98–111)
Creatinine, Ser: 0.82 mg/dL (ref 0.61–1.24)
GFR, Estimated: >60 mL/min (ref >60)
Glucose, Bld: 102 mg/dL — ABNORMAL HIGH (ref 70–99)
Potassium: 4 mmol/L (ref 3.5–5.1)
Sodium: 133 mmol/L — ABNORMAL LOW (ref 135–145)

## 2023-03-04 LAB — ECHOCARDIOGRAM COMPLETE
AR max vel: 1.81 cm2
AV Area VTI: 1.69 cm2
AV Area mean vel: 1.74 cm2
AV Mean grad: 5.8 mmHg
AV Peak grad: 10 mmHg
Ao pk vel: 1.58 m/s
Height: 66 in
S' Lateral: 3.4 cm
Weight: 2218.71 [oz_av]

## 2023-03-04 LAB — CBC
HCT: 30.8 % — ABNORMAL LOW (ref 39.0–52.0)
Hemoglobin: 10.3 g/dL — ABNORMAL LOW (ref 13.0–17.0)
MCH: 30.5 pg (ref 26.0–34.0)
MCHC: 33.4 g/dL (ref 30.0–36.0)
MCV: 91.1 fL (ref 80.0–100.0)
Platelets: 97 10*3/uL — ABNORMAL LOW (ref 150–400)
RBC: 3.38 MIL/uL — ABNORMAL LOW (ref 4.22–5.81)
RDW: 13.7 % (ref 11.5–15.5)
WBC: 10.5 10*3/uL (ref 4.0–10.5)
nRBC: 0 % (ref 0.0–0.2)

## 2023-03-04 LAB — MAGNESIUM: Magnesium: 1.8 mg/dL (ref 1.7–2.4)

## 2023-03-04 NOTE — Discharge Instructions (Signed)
Please continue your Lovenox as outline by the coumadin clinic and go to your appointment on 03/09/23.   ACTIVITY AND EXERCISE  Daily activity and exercise are an important part of your recovery. People recover at different rates depending on their general health and type of valve procedure.  Most people recovering from TAVR feel better relatively quickly   No lifting, pushing, pulling more than 10 pounds (examples to avoid: groceries, vacuuming, gardening, golfing):             - For one week with a procedure through the groin.             - For six weeks for procedures through the chest wall or neck. NOTE: You will typically see one of our providers 7-14 days after your procedure to discuss WHEN TO RESUME the above activities.      DRIVING  Do not drive until you are seen for follow up and cleared by a provider. Generally, we ask patient to not drive for 1 week after their procedure.  If you have been told by your doctor in the past that you may not drive, you must talk with him/her before you begin driving again.   DRESSING  Groin site: you may leave the clear dressing over the site for up to one week or until it falls off.   HYGIENE  If you had a femoral (leg) procedure, you may take a shower when you return home. After the shower, pat the site dry. Do NOT use powder, oils or lotions in your groin area until the site has completely healed.  If you had a chest procedure, you may shower when you return home unless specifically instructed not to by your discharging practitioner.             - DO NOT scrub incision; pat dry with a towel.             - DO NOT apply any lotions, oils, powders to the incision.             - No tub baths / swimming for at least 2 weeks.  If you notice any fevers, chills, increased pain, swelling, bleeding or pus, please contact your doctor.   ADDITIONAL INFORMATION  If you are going to have an upcoming dental procedure, please contact our office as you will  require antibiotics ahead of time to prevent infection on your heart valve.    If you have any questions or concerns you can call the structural heart phone during normal business hours 8am-4pm. If you have an urgent need after hours or weekends please call [phone] to talk to the on call provider for general cardiology. If you have an emergency that requires immediate attention, please call 911.    After TAVR Checklist  Check  Test Description   Follow up appointment in 1-2 weeks  You will see our structural heart advanced practice provider. Your incision sites will be checked and you will be cleared to drive and resume all normal activities if you are doing well.     1 month echo and follow up  You will have an echo to check on your new heart valve and be seen back in the office by a structural heart advanced practice provider.   Follow up with your primary cardiologist You will need to be seen by your primary cardiologist in the following 3-6 months after your 1 month appointment in the valve clinic.    1  year echo and follow up You will have another echo to check on your heart valve after 1 year and be seen back in the office by a structural heart advanced practice provider. This your last structural heart visit.   Bacterial endocarditis prophylaxis  You will have to take antibiotics for the rest of your life before all dental procedures (even teeth cleanings) to protect your heart valve. Antibiotics are also required before some surgeries. Please check with your cardiologist before scheduling any surgeries. Also, please make sure to tell us if you have a penicillin allergy as you will require an alternative antibiotic.

## 2023-03-04 NOTE — Progress Notes (Signed)
CARDIAC REHAB PHASE I   PRE:  Rate/Rhythm: 76 A-fib  BP:  Sitting: 104/51      SaO2: 98 RA  MODE:  Ambulation: 440 ft   AD:   RW  POST:  Rate/Rhythm: 89 A-fib  BP:  Sitting: 114/83      SaO2: 97 RA  Pt amb with supervision assistance, pt denies CP and SOB during amb and was returned to room w/o complaint.   Pt educated on restrictions, HH diet, ex guidelines, and CRP2. Will refer to [facility].  Faustino Congress  ACSM-CEP 9:59 AM 03/04/2023    Service time is from 0941 to [phone].

## 2023-03-04 NOTE — Progress Notes (Signed)
  Echocardiogram 2D Echocardiogram has been performed.  [name] 03/04/2023, 11:04 AM

## 2023-03-04 NOTE — Progress Notes (Addendum)
D/c tele and Ivs. Went over AVS with pt and all questions were addressed.  Linh S Huynh, RN  

## 2023-03-04 NOTE — TOC Transition Note (Signed)
Transition of Care (TOC) - CM/SW Discharge Note Donn Pierini RN, BSN Transitions of Care Unit 4E- RN Case Manager See Treatment Team for direct phone #   Patient Details  Name: [name] MRN: [id number] Date of Birth: 07/17/1945   Clinical Narrative: Pt s/p TAVR, stable for transition home today, no new meds noted for discharge, pt to follow up as per AVS instructions. No TOC needs noted.  Family to transport home   Transition of Care Asessment: Insurance and Status: Insurance coverage has been reviewed Patient has primary care physician: Yes Home environment has been reviewed: home Prior level of function:: independent Prior/Current Home Services: No current home services Social Determinants of Health Reivew: SDOH reviewed no interventions necessary Readmission risk has been reviewed: Yes Transition of care needs: no transition of care needs at this time    Final next level of care: Home/Self Care Barriers to Discharge: No Barriers Identified   Patient Goals and CMS Choice   Choice offered to / list presented to : NA  Discharge Placement                 Home        Discharge Plan and Services Additional resources added to the After Visit Summary for                  DME Arranged: N/A DME Agency: NA       HH Arranged: NA HH Agency: NA        Social Determinants of Health (SDOH) Interventions SDOH Screenings   Food Insecurity: No Food Insecurity (03/03/2023)  Housing: Low Risk  (03/03/2023)  Transportation Needs: No Transportation Needs (03/03/2023)  Utilities: Not At Risk (03/03/2023)  Alcohol Screen: Low Risk  (12/24/2022)  Depression (PHQ2-9): Low Risk  (12/24/2022)  Financial Resource Strain: Low Risk  (12/24/2022)  Physical Activity: Inactive (12/24/2022)  Social Connections: Moderately Isolated (12/24/2022)  Stress: No Stress Concern Present (12/24/2022)  Tobacco Use: Medium Risk (03/03/2023)     Readmission Risk Interventions     No data  to display

## 2023-03-05 ENCOUNTER — Telehealth: Payer: Self-pay | Admitting: Physician Assistant

## 2023-03-05 NOTE — Telephone Encounter (Addendum)
  HEART AND VASCULAR CENTER   MULTIDISCIPLINARY HEART VALVE TEAM   Patient contacted regarding discharge from [facility] on 9/4  Patient understands to follow up with a structural heart APP on 9/11 at [address].  Patient understands discharge instructions? yes Patient understands medications and regimen? yes Patient understands to bring all medications to this visit? Yes   Had one episode of dizziness that resolved. Will continue to monitor   Cline Crock PA-C  [facility]

## 2023-03-09 ENCOUNTER — Ambulatory Visit: Payer: Medicare HMO | Attending: Cardiovascular Disease

## 2023-03-09 ENCOUNTER — Telehealth (HOSPITAL_COMMUNITY): Payer: Self-pay

## 2023-03-09 DIAGNOSIS — Z5181 Encounter for therapeutic drug level monitoring: Secondary | ICD-10-CM

## 2023-03-09 DIAGNOSIS — I4891 Unspecified atrial fibrillation: Secondary | ICD-10-CM | POA: Diagnosis not present

## 2023-03-09 LAB — POCT INR: INR: 1.5 — AB (ref 2.0–3.0)

## 2023-03-09 NOTE — Telephone Encounter (Signed)
Pt is not interested in the cardiac rehab program. Pt stated that he is doing his own exercise at home.   Closed referral.

## 2023-03-09 NOTE — Patient Instructions (Addendum)
Description   Continue Lovenox today and tomorrow. Take 2 tablets today and 1.5 tablets tomorrow and then resume taking 1 tablet daily except 1.5 tablets on Mondays, Wednesdays, and Fridays.  Recheck INR in 1 week - (advised to recheck this week, pt states he can't. Aware of risk)  Coumadin Clinic [phone]

## 2023-03-11 ENCOUNTER — Ambulatory Visit: Payer: Medicare HMO | Attending: Cardiology | Admitting: Cardiology

## 2023-03-11 VITALS — BP 142/60 | HR 53 | Ht 66.0 in | Wt 138.2 lb

## 2023-03-11 DIAGNOSIS — R21 Rash and other nonspecific skin eruption: Secondary | ICD-10-CM | POA: Diagnosis not present

## 2023-03-11 DIAGNOSIS — E785 Hyperlipidemia, unspecified: Secondary | ICD-10-CM

## 2023-03-11 DIAGNOSIS — I724 Aneurysm of artery of lower extremity: Secondary | ICD-10-CM

## 2023-03-11 DIAGNOSIS — I10 Essential (primary) hypertension: Secondary | ICD-10-CM | POA: Diagnosis not present

## 2023-03-11 DIAGNOSIS — R600 Localized edema: Secondary | ICD-10-CM | POA: Diagnosis not present

## 2023-03-11 DIAGNOSIS — R1032 Left lower quadrant pain: Secondary | ICD-10-CM

## 2023-03-11 DIAGNOSIS — Z952 Presence of prosthetic heart valve: Secondary | ICD-10-CM | POA: Diagnosis not present

## 2023-03-11 DIAGNOSIS — I4891 Unspecified atrial fibrillation: Secondary | ICD-10-CM

## 2023-03-11 DIAGNOSIS — I25119 Atherosclerotic heart disease of native coronary artery with unspecified angina pectoris: Secondary | ICD-10-CM

## 2023-03-11 LAB — BASIC METABOLIC PANEL
BUN/Creatinine Ratio: 20 (ref 10–24)
BUN: 19 mg/dL (ref 8–27)
CO2: 26 mmol/L (ref 20–29)
Calcium: 9 mg/dL (ref 8.6–10.2)
Chloride: 102 mmol/L (ref 96–106)
Creatinine, Ser: 0.97 mg/dL (ref 0.76–1.27)
Glucose: 81 mg/dL (ref 70–99)
Potassium: 4.2 mmol/L (ref 3.5–5.2)
Sodium: 139 mmol/L (ref 134–144)
eGFR: 81 mL/min/{1.73_m2} (ref >59)

## 2023-03-11 NOTE — Progress Notes (Signed)
[facility]   MULTIDISCIPLINARY HEART VALVE TEAM  Structural Heart Office Note:  .    Date:  03/11/2023  ID:  [name], DOB [birth year], MRN [id number] PCP: Tyson Alias, MD  [facility] Providers Cardiologist:  [name], MD  }   History of Present Illness: .    [name] is a 77 y.o. male with a history of CAD, PAD, cardiomyopathy, PAF on Coumadin (DOAC cost prohibitive), HLD, HTN, cholecystitis, bladder rupture, and severe LFLG aortic stenosis s/p TAVR 03/03/2023 and is being seen today for TOC follow up.    [name] has known cardiac history of CAD from cath in 2007 and then PAF that began in 2020 treated with cardioversions and anticoagulation. He had a difficult time in 05/2022 from cholecystitis and complicated with sepsis and bladder rupture but had recovered. He developed fluid retention and increasing DOE and was treated with lasix with improvement. Echo from 12/2022 with LVEF=35% with global hypokinesis, mildly reduced RV function, mild to moderate MR, and severe low flow/low gradient aortic stenosis with AVA 0.64, mean gradient 20 mmHg, DVI 0.18. Cardiac cath 01/28/23 with three vessel CAD. We discussed his echo and cath findings in our Structural Heart Meeting and elected to treat his CAD conservatively for now and proceed with TAVR.   He is now s/p TAVR with a 26 mm Edwards Sapien 3 Ultra Resilia THV via the left TF approach using intravascular lithotripsy on 03/03/23. Post operative echo showed stable valve function with a mean gradient at 5.58mmHg and AVA by VTI at 1.69cm2 with no PVL noted. He was restarted on home dose Coumadin and ASA. Follow up INR from 9/9 at 1.5 there for dose was adjusted with plan for repeat on 9/16.  Today he is here alone although his wife is in the waiting room. He reports that his breathing has improved drastically. He has been walking daily with no issues. He states that the Saturday after discharge,  he broke out with a systemic rash. No urticaria. No hives or whelps. There were no new medications added to his regimen at discharge. Denies new foods, detergents or other medications. Unclear as to what has caused this. He has tried intermittent Benadryl. The rash is improving. He is not concerned about it. On groin assessment, he is very tender in the left groin which also has a firm ridge and tingling that runs down his anterior leg concerning for pseudoaneurysm with compression. He can ambulate fine without pain. Pain only present with palpation. Otherwise he looks great and denies chest pain, SOB, palpitations, LE edema, orthopnea, PND, dizziness, or syncope. Denies bleeding in stool or urine.       Physical Exam:    VS:  BP (!) 142/60   Pulse (!) 53   Ht 5\' 6"  (1.676 m)   Wt 138 lb 3.2 oz (62.7 kg)   SpO2 91%   BMI 22.31 kg/m    Wt Readings from Last 3 Encounters:  03/11/23 138 lb 3.2 oz (62.7 kg)  03/04/23 138 lb 10.7 oz (62.9 kg)  02/26/23 141 lb (64 kg)    General: Well developed, well nourished, NAD Neck: Negative for carotid bruits. No JVD Lungs:Clear to ausculation bilaterally. No wheezes, rales, or rhonchi. Breathing is unlabored. Cardiovascular: Irregularly irregular. No murmurs Extremities: No edema. Left groin with firm ridge noted and pain palpation. Radiation with mild tingling into the anterior left leg.  Neuro: Alert and oriented. No focal deficits. No  facial asymmetry. MAE spontaneously. Psych: Responds to questions appropriately with normal affect.    ASSESSMENT AND PLAN: .    Severe AS: Patient doing well with NYHA class I symptoms s/p successful TAVR with a 26 mm Edwards Sapien 3 Ultra Resilia THV via the left TF approach using intravascular lithotripsy on 03/03/23. Post operative echo with stable valve function with a mean gradient at 10.0 mmHg and AVA by VTI at 1.69cm2. L groin with hard ridge and pain with palpation. See plan below. EKG with no HAVB today.  Tolerating ASA and Coumadin with no bleeding. Will require lifelong dental SBE with amoxicillin. Plan one month follow up with echo.   Suspect left groin pseudoaneurysm: Left groin ridge noted on assessment today. Having pain with palpation to the area with tingling/numbness radiating down the anterior leg. Plan left groin [place] on 9/16 in conjunction with his INR scheduled at the [facility] office.    Rash: Unclear etiology of systemic flat rash. No urticaria. Low suspicion for medication reaction given onset 72 hours after hospital discharge. Reports improvement. Continue to treat with PRN Benedryl.    CAD: Not a candidate for surgical CABG/AVR given calcified aorta and other comorbidities. Cath 01/28/23 showed severe triple vessel CAD. If he develops chest pain, he would require complex PCI with orbital atherectomy of the RCA and LAD with long segments of stenting. Continue medical therapy for now with aspirin, statin and beta blocker. Denies anginal symptoms today.    PAD: Noted on pre TAVR scans. We were able to obtain TF access during TAVR using Shockwave intravascular lithotripsy. Continue medical therapy. Obtain left groin [place]    HTN: Stable today with no changes.    PAF: Resumed on home Coumadin with Lovenox bridge at discharge. INR 9/9 at 1.5. Dose adjusted with repeat scheduled for 9/16.    HFrEF: Continue GDMT with Entresto 24-26mg  BID, Farixga 10mg  daily, Coreg 6.25 mg BID, Spiro 12.5mg  daily and PRN lasix. Appears euvolemic today on exam.    LAA filling defect: noted on pre TAVR CT. He was bridged with Lovenox preoperatively. Resumed on home Coumadin/Lovenox bridge post operatively.   Signed, Georgie Chard, NP

## 2023-03-11 NOTE — Patient Instructions (Addendum)
Medication Instructions:  Your physician recommends that you continue on your current medications as directed. Please refer to the Current Medication list given to you today.  *If you need a refill on your cardiac medications before your next appointment, please call your pharmacy*  Lab Work: Your physician recommends that you have lab work today. BMET  If you have labs (blood work) drawn today and your tests are completely normal, you will receive your results only by: MyChart Message (if you have MyChart) OR A paper copy in the mail If you have any lab test that is abnormal or we need to change your treatment, we will call you to review the results.  Testing/Procedures: Your Provider would like you to have an ultrasound of your left groin. Please schedule on same day as coumadin clinic.  Follow-Up: At [facility], you and your health needs are our priority.  As part of our continuing mission to provide you with exceptional heart care, we have created designated Provider Care Teams.  These Care Teams include your primary Cardiologist (physician) and Advanced Practice Providers (APPs -  Physician Assistants and Nurse Practitioners) who all work together to provide you with the care you need, when you need it.  We recommend signing up for the patient portal called "MyChart".  Sign up information is provided on this After Visit Summary.  MyChart is used to connect with patients for Virtual Visits (Telemedicine).  Patients are able to view lab/test results, encounter notes, upcoming appointments, etc.  Non-urgent messages can be sent to your provider as well.   To learn more about what you can do with MyChart, go to [email or web address].    Your next appointment:   1 month(s)  Provider:   Georgie Chard, NP

## 2023-03-16 ENCOUNTER — Ambulatory Visit (INDEPENDENT_AMBULATORY_CARE_PROVIDER_SITE_OTHER): Payer: Medicare HMO

## 2023-03-16 ENCOUNTER — Ambulatory Visit
Admission: RE | Admit: 2023-03-16 | Discharge: 2023-03-16 | Disposition: A | Discharge status: Home / Self Care | Payer: Medicare HMO | Source: Ambulatory Visit | Attending: Cardiology | Admitting: Cardiology

## 2023-03-16 ENCOUNTER — Ambulatory Visit: Payer: Medicare HMO | Admitting: Cardiology

## 2023-03-16 DIAGNOSIS — I4891 Unspecified atrial fibrillation: Secondary | ICD-10-CM | POA: Insufficient documentation

## 2023-03-16 DIAGNOSIS — R600 Localized edema: Secondary | ICD-10-CM | POA: Insufficient documentation

## 2023-03-16 DIAGNOSIS — Z5181 Encounter for therapeutic drug level monitoring: Secondary | ICD-10-CM | POA: Diagnosis not present

## 2023-03-16 DIAGNOSIS — I4819 Other persistent atrial fibrillation: Secondary | ICD-10-CM | POA: Diagnosis not present

## 2023-03-16 DIAGNOSIS — R1032 Left lower quadrant pain: Secondary | ICD-10-CM | POA: Insufficient documentation

## 2023-03-16 LAB — POCT INR: INR: 1.5 — AB (ref 2.0–3.0)

## 2023-03-16 NOTE — Patient Instructions (Signed)
TAKE 2 TABLETS TODAY ONLY THEN INCREASE TO 1.5 TABLETS DAILY EXCEPT 1 TABLET ON MONDAY, WEDNESDAY and FRIDAY Recheck INR in 1 week - (advised to recheck this week, pt states he can't. Aware of risk)  [facility]in Clinic [phone]

## 2023-03-17 ENCOUNTER — Telehealth: Payer: Self-pay | Admitting: *Deleted

## 2023-03-17 ENCOUNTER — Other Ambulatory Visit: Payer: Self-pay | Admitting: *Deleted

## 2023-03-17 DIAGNOSIS — I5021 Acute systolic (congestive) heart failure: Secondary | ICD-10-CM

## 2023-03-17 MED ORDER — DAPAGLIFLOZIN PROPANEDIOL 10 MG PO TABS: 10.0000 mg | ORAL_TABLET | Freq: Every day | ORAL | Status: DC

## 2023-03-17 NOTE — Telephone Encounter (Signed)
Patient assistance application for farxiga faxed to [employer]. Patient aware samples are at the front desk for pick up

## 2023-03-18 ENCOUNTER — Encounter: Payer: Self-pay | Admitting: *Deleted

## 2023-03-23 ENCOUNTER — Ambulatory Visit: Payer: Medicare HMO | Attending: Cardiology

## 2023-03-23 DIAGNOSIS — Z5181 Encounter for therapeutic drug level monitoring: Secondary | ICD-10-CM | POA: Diagnosis not present

## 2023-03-23 DIAGNOSIS — I4891 Unspecified atrial fibrillation: Secondary | ICD-10-CM

## 2023-03-23 LAB — POCT INR: INR: 1.4 — AB (ref 2.0–3.0)

## 2023-03-23 NOTE — Patient Instructions (Addendum)
Description   TAKE 2 TABLETS TODAY AND 2 TABLETS TOMORROW AND THEN INCREASE TO 1.5 TABLETS DAILY.  Recheck INR in 1 week - (advised to recheck this week, pt states he can't. Aware of risk)  Coumadin Clinic [phone]

## 2023-03-28 ENCOUNTER — Other Ambulatory Visit: Payer: Self-pay | Admitting: Cardiology

## 2023-03-28 DIAGNOSIS — I5021 Acute systolic (congestive) heart failure: Secondary | ICD-10-CM

## 2023-03-30 ENCOUNTER — Ambulatory Visit: Payer: Medicare HMO | Attending: Cardiology

## 2023-03-30 DIAGNOSIS — I4819 Other persistent atrial fibrillation: Secondary | ICD-10-CM | POA: Diagnosis not present

## 2023-03-30 DIAGNOSIS — I4891 Unspecified atrial fibrillation: Secondary | ICD-10-CM

## 2023-03-30 DIAGNOSIS — Z5181 Encounter for therapeutic drug level monitoring: Secondary | ICD-10-CM

## 2023-03-30 LAB — POCT INR: INR: 2 (ref 2.0–3.0)

## 2023-03-30 NOTE — Patient Instructions (Signed)
INCREASE TO 1.5 TABLETS DAILY, EXCEPT 2 TABLETS ON WEDNESDAY. Recheck INR in 1 week  [facility] Clinic [phone]

## 2023-04-06 ENCOUNTER — Ambulatory Visit: Payer: Medicare HMO

## 2023-04-06 ENCOUNTER — Other Ambulatory Visit (HOSPITAL_COMMUNITY): Payer: Medicare HMO

## 2023-04-06 NOTE — Progress Notes (Unsigned)
to be calcified on fluoroscopy. I will discharge him home today after bedrest. We will arrange pre TAVR CT scans and have him seen by one of the CT surgeons on our valve team. If he is not felt to be a candidate for surgery, he would require complex PCI with orbital atherectomy of the RCA and LAD with long segments of stenting followed by TAVR. Given elevated pressures, will have him increase his Lasix to 40 mg po BID for three days.  Findings Coronary Findings Diagnostic   Dominance: Right  Left Anterior Descending Vessel is large. Ost LAD to Prox LAD lesion is 60% stenosed. The lesion is calcified. Mid LAD lesion is 99% stenosed. The lesion is calcified. Mid LAD to Dist LAD lesion is 40% stenosed.  First Diagonal Branch 1st Diag lesion is 99% stenosed.  Left Circumflex Vessel is large. Prox Cx to Mid Cx lesion is 50% stenosed.  Third Obtuse Marginal Branch 3rd Mrg lesion is 99% stenosed.  Right Coronary Artery Vessel is large. Prox RCA to Mid RCA lesion is 70% stenosed. The lesion is calcified. Dist RCA lesion is 80% stenosed. The lesion is calcified.  Right Posterior Descending Artery RPDA lesion is 99% stenosed.  Intervention  No interventions have been documented.     ECHOCARDIOGRAM  ECHOCARDIOGRAM COMPLETE 04/08/2023  Narrative ECHOCARDIOGRAM REPORT    Patient Name:   [name] Date of Exam: 04/08/2023 Medical Rec #:  [id number]         Height:       66.0 in Accession #:    [id number]        Weight:       138.2 lb Date of Birth:  [birth year]         BSA:          1.709 m Patient Age:    77 years          BP:           102/58 mmHg Patient Gender: M                 HR:           63 bpm. Exam Location:  [address]  Procedure: 2D Echo, Cardiac Doppler and Color Doppler  Indications:     Z95.2 Post TAVR evaluation (26mm Edwards Sapien 3 Ultra Resilia)  History:         Patient has prior history of Echocardiogram examinations, most recent 03/04/2023. CAD, Arrythmias:Atrial Fibrillation; Risk Factors:Hypertension and Dyslipidemia. Prediabetes. Emphysema. Aortic Valve: 26 mm Sapien prosthetic, stented (TAVR) valve is present in the aortic position. Procedure Date: 03/03/23.  Sonographer:     Cathie Beams RCS Referring Phys:  [phone] Kapil Petropoulos D Jaspal Pultz Diagnosing Phys: Weston Brass MD  IMPRESSIONS   1. Left ventricular ejection fraction, by estimation, is 60 to 65%. The left ventricle has normal function. The left  ventricle has no regional wall motion abnormalities. There is mild left ventricular hypertrophy. Left ventricular diastolic parameters are indeterminate. 2. Right ventricular systolic function is mildly reduced. The right ventricular size is normal. There is normal pulmonary artery systolic pressure. The estimated right ventricular systolic pressure is 31.7 mmHg. 3. Left atrial size was severely dilated. 4. Right atrial size was mildly dilated. 5. The mitral valve is degenerative. Mild mitral valve regurgitation. No evidence of mitral stenosis. Mild-moderate MAC. 6. The aortic valve has been repaired/replaced. Aortic valve regurgitation is trivial - posterior paravalvular leak. There is a 26 mm Sapien prosthetic (TAVR) valve present  [facility]   MULTIDISCIPLINARY HEART VALVE TEAM  Structural Heart Office Note:  .   Date:  04/08/2023  ID:  MONNIE ANSPACH, DOB [birth year], MRN [id number] PCP: Tyson Alias, MD  [facility] Providers Cardiologist:  [name], MD {  History of Present Illness: .   MCCADE SULLENBERGER is a 77 y.o. male with a history of CAD, PAD, cardiomyopathy, PAF on Coumadin (DOAC cost prohibitive), HLD, HTN, cholecystitis, bladder rupture, and severe LFLG aortic stenosis s/p TAVR 03/03/2023 and is being seen today for one month follow up.    Mr. Chamberland has known cardiac history of CAD from cath in 2007 and then PAF that began in 2020 treated with cardioversions and anticoagulation. He had a difficult time in 05/2022 from cholecystitis and complicated with sepsis and bladder rupture but had recovered. He developed fluid retention and increasing DOE and was treated with lasix with improvement. Echo from 12/2022 with LVEF=35% with global hypokinesis, mildly reduced RV function, mild to moderate MR, and severe low flow/low gradient aortic stenosis with AVA 0.64, mean gradient 20 mmHg, DVI 0.18. Cardiac cath 01/28/23 with three vessel CAD. We discussed his echo and cath findings in our Structural Heart Meeting and elected to treat his CAD conservatively for now and proceed with TAVR.    He is now s/p TAVR with a 26 mm Edwards Sapien 3 Ultra Resilia THV via the left TF approach using intravascular lithotripsy on 03/03/23. Post operative echo showed stable valve function with a mean gradient at 5.32mmHg and AVA by VTI at 1.69cm2 with no PVL noted. He was restarted on home dose Coumadin and ASA. Follow up INR from 9/9 at 1.5 there for dose was adjusted with plan for repeat on 9/16.   Today he is here alone and reports that he has been doing well with no issues. Does report a few episodes of dizziness with quick movements but no syncope. Continues to have great improvement with his  breathing since TAVR. He denies chest pain, SOB, palpitations, LE edema, orthopnea, PND, or syncope. Denies bleeding in stool or urine.    Studies Reviewed: .   Cardiac Studies & Procedures   CARDIAC CATHETERIZATION  CARDIAC CATHETERIZATION 01/28/2023  Narrative   Prox RCA to Mid RCA lesion is 70% stenosed.   Dist RCA lesion is 80% stenosed.   RPDA lesion is 99% stenosed.   3rd Mrg lesion is 99% stenosed.   Prox Cx to Mid Cx lesion is 50% stenosed.   Ost LAD to Prox LAD lesion is 60% stenosed.   1st Diag lesion is 99% stenosed.   Mid LAD lesion is 99% stenosed.   Mid LAD to Dist LAD lesion is 40% stenosed.  Severe triple vessel CAD Heavily calcified proximal and mid LAD with moderate proximal stenosis and severe mid vessel stenosis. The Moderate caliber diagonal branch has a severe stenosis. The circumflex has moderate proximal and mid vessel stenosis. The distal obtuse marginal branch is a small caliber vessel with a severe focal stenosis The large, dominant RCA has diffuse, severe, heavily calcified mid and distal vessel stenosis. The PDA has a severe ostial stenosis Elevated right heart pressures Severe aortic stenosis  (AVA 0.75 cm2. MG 30.9 mmHg)  Recommendations: He has severe three vessel CAD with heavily calcified, diffusely diseased vessels. He also has severe aortic stenosis. If he is felt to be a candidate for surgery, I think he would be best served with CABG/surgical AVR. His aortic arch appears  to be calcified on fluoroscopy. I will discharge him home today after bedrest. We will arrange pre TAVR CT scans and have him seen by one of the CT surgeons on our valve team. If he is not felt to be a candidate for surgery, he would require complex PCI with orbital atherectomy of the RCA and LAD with long segments of stenting followed by TAVR. Given elevated pressures, will have him increase his Lasix to 40 mg po BID for three days.  Findings Coronary Findings Diagnostic   Dominance: Right  Left Anterior Descending Vessel is large. Ost LAD to Prox LAD lesion is 60% stenosed. The lesion is calcified. Mid LAD lesion is 99% stenosed. The lesion is calcified. Mid LAD to Dist LAD lesion is 40% stenosed.  First Diagonal Branch 1st Diag lesion is 99% stenosed.  Left Circumflex Vessel is large. Prox Cx to Mid Cx lesion is 50% stenosed.  Third Obtuse Marginal Branch 3rd Mrg lesion is 99% stenosed.  Right Coronary Artery Vessel is large. Prox RCA to Mid RCA lesion is 70% stenosed. The lesion is calcified. Dist RCA lesion is 80% stenosed. The lesion is calcified.  Right Posterior Descending Artery RPDA lesion is 99% stenosed.  Intervention  No interventions have been documented.     ECHOCARDIOGRAM  ECHOCARDIOGRAM COMPLETE 04/08/2023  Narrative ECHOCARDIOGRAM REPORT    Patient Name:   [name] Date of Exam: 04/08/2023 Medical Rec #:  [id number]         Height:       66.0 in Accession #:    [id number]        Weight:       138.2 lb Date of Birth:  [birth year]         BSA:          1.709 m Patient Age:    77 years          BP:           102/58 mmHg Patient Gender: M                 HR:           63 bpm. Exam Location:  [address]  Procedure: 2D Echo, Cardiac Doppler and Color Doppler  Indications:     Z95.2 Post TAVR evaluation (26mm Edwards Sapien 3 Ultra Resilia)  History:         Patient has prior history of Echocardiogram examinations, most recent 03/04/2023. CAD, Arrythmias:Atrial Fibrillation; Risk Factors:Hypertension and Dyslipidemia. Prediabetes. Emphysema. Aortic Valve: 26 mm Sapien prosthetic, stented (TAVR) valve is present in the aortic position. Procedure Date: 03/03/23.  Sonographer:     Cathie Beams RCS Referring Phys:  [phone] Kapil Petropoulos D Jaspal Pultz Diagnosing Phys: Weston Brass MD  IMPRESSIONS   1. Left ventricular ejection fraction, by estimation, is 60 to 65%. The left ventricle has normal function. The left  ventricle has no regional wall motion abnormalities. There is mild left ventricular hypertrophy. Left ventricular diastolic parameters are indeterminate. 2. Right ventricular systolic function is mildly reduced. The right ventricular size is normal. There is normal pulmonary artery systolic pressure. The estimated right ventricular systolic pressure is 31.7 mmHg. 3. Left atrial size was severely dilated. 4. Right atrial size was mildly dilated. 5. The mitral valve is degenerative. Mild mitral valve regurgitation. No evidence of mitral stenosis. Mild-moderate MAC. 6. The aortic valve has been repaired/replaced. Aortic valve regurgitation is trivial - posterior paravalvular leak. There is a 26 mm Sapien prosthetic (TAVR) valve present  to be calcified on fluoroscopy. I will discharge him home today after bedrest. We will arrange pre TAVR CT scans and have him seen by one of the CT surgeons on our valve team. If he is not felt to be a candidate for surgery, he would require complex PCI with orbital atherectomy of the RCA and LAD with long segments of stenting followed by TAVR. Given elevated pressures, will have him increase his Lasix to 40 mg po BID for three days.  Findings Coronary Findings Diagnostic   Dominance: Right  Left Anterior Descending Vessel is large. Ost LAD to Prox LAD lesion is 60% stenosed. The lesion is calcified. Mid LAD lesion is 99% stenosed. The lesion is calcified. Mid LAD to Dist LAD lesion is 40% stenosed.  First Diagonal Branch 1st Diag lesion is 99% stenosed.  Left Circumflex Vessel is large. Prox Cx to Mid Cx lesion is 50% stenosed.  Third Obtuse Marginal Branch 3rd Mrg lesion is 99% stenosed.  Right Coronary Artery Vessel is large. Prox RCA to Mid RCA lesion is 70% stenosed. The lesion is calcified. Dist RCA lesion is 80% stenosed. The lesion is calcified.  Right Posterior Descending Artery RPDA lesion is 99% stenosed.  Intervention  No interventions have been documented.     ECHOCARDIOGRAM  ECHOCARDIOGRAM COMPLETE 04/08/2023  Narrative ECHOCARDIOGRAM REPORT    Patient Name:   [name] Date of Exam: 04/08/2023 Medical Rec #:  [id number]         Height:       66.0 in Accession #:    [id number]        Weight:       138.2 lb Date of Birth:  [birth year]         BSA:          1.709 m Patient Age:    77 years          BP:           102/58 mmHg Patient Gender: M                 HR:           63 bpm. Exam Location:  [address]  Procedure: 2D Echo, Cardiac Doppler and Color Doppler  Indications:     Z95.2 Post TAVR evaluation (26mm Edwards Sapien 3 Ultra Resilia)  History:         Patient has prior history of Echocardiogram examinations, most recent 03/04/2023. CAD, Arrythmias:Atrial Fibrillation; Risk Factors:Hypertension and Dyslipidemia. Prediabetes. Emphysema. Aortic Valve: 26 mm Sapien prosthetic, stented (TAVR) valve is present in the aortic position. Procedure Date: 03/03/23.  Sonographer:     Cathie Beams RCS Referring Phys:  [phone] Kapil Petropoulos D Jaspal Pultz Diagnosing Phys: Weston Brass MD  IMPRESSIONS   1. Left ventricular ejection fraction, by estimation, is 60 to 65%. The left ventricle has normal function. The left  ventricle has no regional wall motion abnormalities. There is mild left ventricular hypertrophy. Left ventricular diastolic parameters are indeterminate. 2. Right ventricular systolic function is mildly reduced. The right ventricular size is normal. There is normal pulmonary artery systolic pressure. The estimated right ventricular systolic pressure is 31.7 mmHg. 3. Left atrial size was severely dilated. 4. Right atrial size was mildly dilated. 5. The mitral valve is degenerative. Mild mitral valve regurgitation. No evidence of mitral stenosis. Mild-moderate MAC. 6. The aortic valve has been repaired/replaced. Aortic valve regurgitation is trivial - posterior paravalvular leak. There is a 26 mm Sapien prosthetic (TAVR) valve present  [facility]   MULTIDISCIPLINARY HEART VALVE TEAM  Structural Heart Office Note:  .   Date:  04/08/2023  ID:  MONNIE ANSPACH, DOB [birth year], MRN [id number] PCP: Tyson Alias, MD  [facility] Providers Cardiologist:  [name], MD {  History of Present Illness: .   MCCADE SULLENBERGER is a 77 y.o. male with a history of CAD, PAD, cardiomyopathy, PAF on Coumadin (DOAC cost prohibitive), HLD, HTN, cholecystitis, bladder rupture, and severe LFLG aortic stenosis s/p TAVR 03/03/2023 and is being seen today for one month follow up.    Mr. Chamberland has known cardiac history of CAD from cath in 2007 and then PAF that began in 2020 treated with cardioversions and anticoagulation. He had a difficult time in 05/2022 from cholecystitis and complicated with sepsis and bladder rupture but had recovered. He developed fluid retention and increasing DOE and was treated with lasix with improvement. Echo from 12/2022 with LVEF=35% with global hypokinesis, mildly reduced RV function, mild to moderate MR, and severe low flow/low gradient aortic stenosis with AVA 0.64, mean gradient 20 mmHg, DVI 0.18. Cardiac cath 01/28/23 with three vessel CAD. We discussed his echo and cath findings in our Structural Heart Meeting and elected to treat his CAD conservatively for now and proceed with TAVR.    He is now s/p TAVR with a 26 mm Edwards Sapien 3 Ultra Resilia THV via the left TF approach using intravascular lithotripsy on 03/03/23. Post operative echo showed stable valve function with a mean gradient at 5.32mmHg and AVA by VTI at 1.69cm2 with no PVL noted. He was restarted on home dose Coumadin and ASA. Follow up INR from 9/9 at 1.5 there for dose was adjusted with plan for repeat on 9/16.   Today he is here alone and reports that he has been doing well with no issues. Does report a few episodes of dizziness with quick movements but no syncope. Continues to have great improvement with his  breathing since TAVR. He denies chest pain, SOB, palpitations, LE edema, orthopnea, PND, or syncope. Denies bleeding in stool or urine.    Studies Reviewed: .   Cardiac Studies & Procedures   CARDIAC CATHETERIZATION  CARDIAC CATHETERIZATION 01/28/2023  Narrative   Prox RCA to Mid RCA lesion is 70% stenosed.   Dist RCA lesion is 80% stenosed.   RPDA lesion is 99% stenosed.   3rd Mrg lesion is 99% stenosed.   Prox Cx to Mid Cx lesion is 50% stenosed.   Ost LAD to Prox LAD lesion is 60% stenosed.   1st Diag lesion is 99% stenosed.   Mid LAD lesion is 99% stenosed.   Mid LAD to Dist LAD lesion is 40% stenosed.  Severe triple vessel CAD Heavily calcified proximal and mid LAD with moderate proximal stenosis and severe mid vessel stenosis. The Moderate caliber diagonal branch has a severe stenosis. The circumflex has moderate proximal and mid vessel stenosis. The distal obtuse marginal branch is a small caliber vessel with a severe focal stenosis The large, dominant RCA has diffuse, severe, heavily calcified mid and distal vessel stenosis. The PDA has a severe ostial stenosis Elevated right heart pressures Severe aortic stenosis  (AVA 0.75 cm2. MG 30.9 mmHg)  Recommendations: He has severe three vessel CAD with heavily calcified, diffusely diseased vessels. He also has severe aortic stenosis. If he is felt to be a candidate for surgery, I think he would be best served with CABG/surgical AVR. His aortic arch appears  [facility]   MULTIDISCIPLINARY HEART VALVE TEAM  Structural Heart Office Note:  .   Date:  04/08/2023  ID:  MONNIE ANSPACH, DOB [birth year], MRN [id number] PCP: Tyson Alias, MD  [facility] Providers Cardiologist:  [name], MD {  History of Present Illness: .   MCCADE SULLENBERGER is a 77 y.o. male with a history of CAD, PAD, cardiomyopathy, PAF on Coumadin (DOAC cost prohibitive), HLD, HTN, cholecystitis, bladder rupture, and severe LFLG aortic stenosis s/p TAVR 03/03/2023 and is being seen today for one month follow up.    Mr. Chamberland has known cardiac history of CAD from cath in 2007 and then PAF that began in 2020 treated with cardioversions and anticoagulation. He had a difficult time in 05/2022 from cholecystitis and complicated with sepsis and bladder rupture but had recovered. He developed fluid retention and increasing DOE and was treated with lasix with improvement. Echo from 12/2022 with LVEF=35% with global hypokinesis, mildly reduced RV function, mild to moderate MR, and severe low flow/low gradient aortic stenosis with AVA 0.64, mean gradient 20 mmHg, DVI 0.18. Cardiac cath 01/28/23 with three vessel CAD. We discussed his echo and cath findings in our Structural Heart Meeting and elected to treat his CAD conservatively for now and proceed with TAVR.    He is now s/p TAVR with a 26 mm Edwards Sapien 3 Ultra Resilia THV via the left TF approach using intravascular lithotripsy on 03/03/23. Post operative echo showed stable valve function with a mean gradient at 5.32mmHg and AVA by VTI at 1.69cm2 with no PVL noted. He was restarted on home dose Coumadin and ASA. Follow up INR from 9/9 at 1.5 there for dose was adjusted with plan for repeat on 9/16.   Today he is here alone and reports that he has been doing well with no issues. Does report a few episodes of dizziness with quick movements but no syncope. Continues to have great improvement with his  breathing since TAVR. He denies chest pain, SOB, palpitations, LE edema, orthopnea, PND, or syncope. Denies bleeding in stool or urine.    Studies Reviewed: .   Cardiac Studies & Procedures   CARDIAC CATHETERIZATION  CARDIAC CATHETERIZATION 01/28/2023  Narrative   Prox RCA to Mid RCA lesion is 70% stenosed.   Dist RCA lesion is 80% stenosed.   RPDA lesion is 99% stenosed.   3rd Mrg lesion is 99% stenosed.   Prox Cx to Mid Cx lesion is 50% stenosed.   Ost LAD to Prox LAD lesion is 60% stenosed.   1st Diag lesion is 99% stenosed.   Mid LAD lesion is 99% stenosed.   Mid LAD to Dist LAD lesion is 40% stenosed.  Severe triple vessel CAD Heavily calcified proximal and mid LAD with moderate proximal stenosis and severe mid vessel stenosis. The Moderate caliber diagonal branch has a severe stenosis. The circumflex has moderate proximal and mid vessel stenosis. The distal obtuse marginal branch is a small caliber vessel with a severe focal stenosis The large, dominant RCA has diffuse, severe, heavily calcified mid and distal vessel stenosis. The PDA has a severe ostial stenosis Elevated right heart pressures Severe aortic stenosis  (AVA 0.75 cm2. MG 30.9 mmHg)  Recommendations: He has severe three vessel CAD with heavily calcified, diffusely diseased vessels. He also has severe aortic stenosis. If he is felt to be a candidate for surgery, I think he would be best served with CABG/surgical AVR. His aortic arch appears

## 2023-04-08 ENCOUNTER — Ambulatory Visit (INDEPENDENT_AMBULATORY_CARE_PROVIDER_SITE_OTHER): Payer: Medicare HMO

## 2023-04-08 ENCOUNTER — Ambulatory Visit (HOSPITAL_COMMUNITY): Payer: Medicare HMO | Attending: Cardiovascular Disease

## 2023-04-08 ENCOUNTER — Ambulatory Visit: Payer: Medicare HMO | Admitting: Cardiology

## 2023-04-08 VITALS — BP 118/58 | Ht 66.0 in | Wt 142.8 lb

## 2023-04-08 DIAGNOSIS — Z5181 Encounter for therapeutic drug level monitoring: Secondary | ICD-10-CM

## 2023-04-08 DIAGNOSIS — I4891 Unspecified atrial fibrillation: Secondary | ICD-10-CM

## 2023-04-08 DIAGNOSIS — I25119 Atherosclerotic heart disease of native coronary artery with unspecified angina pectoris: Secondary | ICD-10-CM

## 2023-04-08 DIAGNOSIS — I10 Essential (primary) hypertension: Secondary | ICD-10-CM | POA: Insufficient documentation

## 2023-04-08 DIAGNOSIS — Z952 Presence of prosthetic heart valve: Secondary | ICD-10-CM | POA: Diagnosis not present

## 2023-04-08 DIAGNOSIS — I4819 Other persistent atrial fibrillation: Secondary | ICD-10-CM | POA: Insufficient documentation

## 2023-04-08 LAB — ECHOCARDIOGRAM COMPLETE
AR max vel: 2.36 cm2
AV Area VTI: 2.56 cm2
AV Area mean vel: 2.36 cm2
AV Mean grad: 5.7 mm[Hg]
AV Peak grad: 10.8 mm[Hg]
Ao pk vel: 1.64 m/s
P 1/2 time: 820 ms
S' Lateral: 2 cm

## 2023-04-08 LAB — POCT INR: INR: 2 (ref 2.0–3.0)

## 2023-04-08 NOTE — Patient Instructions (Addendum)
Medication Instructions:  Your physician recommends that you continue on your current medications as directed. Please refer to the Current Medication list given to you today.  *If you need a refill on your cardiac medications before your next appointment, please call your pharmacy*  Lab Work: None ordered If you have labs (blood work) drawn today and your tests are completely normal, you will receive your results only by: MyChart Message (if you have MyChart) OR A paper copy in the mail If you have any lab test that is abnormal or we need to change your treatment, we will call you to review the results.  Follow-Up: At [facility], you and your health needs are our priority.  As part of our continuing mission to provide you with exceptional heart care, we have created designated Provider Care Teams.  These Care Teams include your primary Cardiologist (physician) and Advanced Practice Providers (APPs -  Physician Assistants and Nurse Practitioners) who all work together to provide you with the care you need, when you need it.  Your next appointment:   04/30/23 at 9:30 AM--Dr [name]  Then  1 year(s)  Provider:   Georgie Chard, NP

## 2023-04-08 NOTE — Patient Instructions (Addendum)
Description   START TAKING 1.5 TABLETS DAILY, EXCEPT 2 TABLETS ON SUNDAYS AND WEDNESDAYS. Recheck INR in 3 weeks  [facility] [phone]

## 2023-04-09 ENCOUNTER — Telehealth: Payer: Self-pay | Admitting: Cardiology

## 2023-04-09 NOTE — Telephone Encounter (Signed)
Patient is returning call in regards to results. Requesting call back. 

## 2023-04-09 NOTE — Telephone Encounter (Signed)
Loa Socks, LPN 16/03/9603  2:17 PM EDT     Left message for the pt to call back for results.   Filbert Schilder, NP 04/09/2023 12:04 PM EDT     Please let the patient know that his echocardiogram looks great with stable valve function!    Patient called w/results

## 2023-04-16 NOTE — Progress Notes (Signed)
HPI: FU CM, CAD, PAF and AS s/p TAVR. He also has a history of coronary artery disease with cardiomyopathy out of proportion, alcohol use, now resolved and paroxysmal atrial fibrillation. Abdominal ultrasound in February 2012 showed no aneurysm. ABIs October 2017 normal. Patient found to have recurrent atrial fibrillation September 2020 that was asymptomatic. Pt underwent successful cardioversion February 2021. Echocardiogram repeated July 2024 and showed newly reduced LV function with ejection fraction 35%, mild left ventricular hypertrophy, mild RV dysfunction, severe left atrial enlargement, moderate right atrial enlargement, mild to moderate mitral regurgitation, moderate tricuspid regurgitation, calcified aortic valve with mean gradient 20 mmHg, aortic valve area 0.64 cm suggestive of severe low-flow/low gradient aortic stenosis and mild aortic insufficiency; small pericardial effusion.  Cardiac catheterization July 2024 showed 70% mid RCA, 80% distal RCA, 99% PDA, 99% third marginal, 60% ostial LAD, 99% mid LAD, 99% first diagonal and 50% circumflex.  Subsequently had CTA that showed left atrial appendage thrombus and moderate right pleural effusion/small left pleural effusion.  Patient was not felt to be candidate for CABG/AVR given calcified aorta and comorbidities.  It was elected to treat his coronary artery disease medically and if he developed angina and may require orbital atherectomy of the RCA and LAD with long segments of stenting.  Patient subsequently underwent TAVR (26 mm Edwards SAPIEN 3 THV).  Follow-up echocardiogram October 2024 showed normal LV function, mild left ventricular hypertrophy, mild RV dysfunction, severe left atrial enlargement, mild right atrial enlargement, mild mitral regurgitation, status post aortic valve replacement with trace aortic insufficiency and mean gradient 5.7 mmHg.  Since he was last seen, he has dyspnea on exertion is much improved.  There is no  orthopnea, PND, pedal edema, chest pain or syncope.  He is having difficulties with dizziness with standing.  Current Outpatient Medications  Medication Sig Dispense Refill   acetaminophen (TYLENOL) 500 MG tablet Take 2 tablets (1,000 mg total) by mouth 4 (four) times daily. 120 tablet 3   aspirin EC 81 MG tablet Take 1 tablet (81 mg total) by mouth daily. Swallow whole.     atorvastatin (LIPITOR) 40 MG tablet TAKE 1 TABLET EVERY DAY 90 tablet 3   carvedilol (COREG) 6.25 MG tablet TAKE 1 TABLET TWICE DAILY WITH MEALS (NEED MD APPOINTMENT) 180 tablet 3   dapagliflozin propanediol (FARXIGA) 10 MG TABS tablet Take 1 tablet (10 mg total) by mouth daily before breakfast. 14 tablet    docusate sodium (COLACE) 100 MG capsule Take 1 capsule (100 mg total) by mouth 2 (two) times daily. 60 capsule 1   finasteride (PROSCAR) 5 MG tablet Take 1 tablet (5 mg total) by mouth daily. 30 tablet 5   furosemide (LASIX) 20 MG tablet TAKE 1 TABLET EVERY DAY 90 tablet 3   sacubitril-valsartan (ENTRESTO) 24-26 MG Take 1 tablet by mouth 2 (two) times daily. 28 tablet    silodosin (RAPAFLO) 8 MG CAPS capsule Take 1 capsule (8 mg total) by mouth daily with breakfast. 30 capsule 3   spironolactone (ALDACTONE) 25 MG tablet Take 0.5 tablets (12.5 mg total) by mouth daily. 45 tablet 3   warfarin (COUMADIN) 2.5 MG tablet TAKE 1/2 TO 1 TABLET DAILY AS DIRECTED BY THE COUMADIN CLINIC 90 tablet 3   No current facility-administered medications for this visit.     Past Medical History:  Diagnosis Date   Allergic rhinitis 08/08/2013   takes Loratadine daily    Cataract    right but immature   CHF (congestive heart  failure) (HCC) 2007   Complication of anesthesia    stopped breathing - during shoulder surgery 2009-2010   Coronary artery disease 10/02/2009   Cardiac cath (October 2007): 20% left main, 60% mid LAD, 60-70% distal LAD, 30-40% first diagonal, 30% circumflex, 40% OM, 30-40% RCA    Degenerative joint disease of  shoulder 08/03/2013   Bilateral, s/p right total shoulder arthroplasty 05/29/2011 and left shoulder arthroplasty 06/08/2014   Diastolic dysfunction 04/04/2016   Echo (04/04/2016): Grade I   Diverticulosis 10/07/2013   Seen on colonoscopy in 2007    Dyspnea    Erectile dysfunction 05/07/2009   Essential hypertension 08/03/2013   had been on Lisinopril but stopped per MD   First degree AV block 03/14/2016   Gastric ulcer 10/07/2013   Seen on EGD 04/10/2006    Hemorrhoids 10/07/2013   Hyperlipidemia 10/02/2009   Paroxysmal atrial fibrillation (HCC) 07/14/2006   One episode, provoked by alcohol use disorder, briefly anticoagulated with warfarin, then switched to aspirin alone   Pre-diabetes    S/P TAVR (transcatheter aortic valve replacement) 03/03/2023   s/p TAVR with a 26 mm Edwards S3UR via the TF approach by Dr. Clifton James & Weldner   Sciatica associated with disorder of lumbar spine 08/03/2013   Anterolisthesis with right L 4-5 nerve root compression.  Treated with epidural injections and Physical Therapy.    Seborrheic keratosis 10/07/2013   Severe aortic stenosis    Tubular adenoma of colon     Past Surgical History:  Procedure Laterality Date   BIOPSY  06/02/2022   Procedure: BIOPSY;  Surgeon: Meridee Score Netty Starring., MD;  Location: [facility];  Service: Gastroenterology;;   CARDIAC CATHETERIZATION  2007   CARDIOVERSION     CARDIOVERSION N/A 08/04/2019   Procedure: CARDIOVERSION;  Surgeon: Pricilla Riffle, MD;  Location: [facility] ENDOSCOPY;  Service: Cardiovascular;  Laterality: N/A;   CHOLECYSTECTOMY N/A 04/21/2022   Procedure: LAPAROSCOPIC CHOLECYSTECTOMY;  Surgeon: Diamantina Monks, MD;  Location: [facility] OR;  Service: General;  Laterality: N/A;   COLONOSCOPY     CYSTOSCOPY N/A 01/04/2022   Procedure: Bluford Kaufmann;  Surgeon: Heloise Purpura, MD;  Location: [facility] OR;  Service: Urology;  Laterality: N/A;   ENDOSCOPIC RETROGRADE CHOLANGIOPANCREATOGRAPHY (ERCP) WITH PROPOFOL N/A 06/02/2022    Procedure: ENDOSCOPIC RETROGRADE CHOLANGIOPANCREATOGRAPHY (ERCP) WITH PROPOFOL;  Surgeon: Meridee Score Netty Starring., MD;  Location: [facility] ENDOSCOPY;  Service: Gastroenterology;  Laterality: N/A;   ERCP N/A 04/21/2022   Procedure: ATTEMPTED ENDOSCOPIC RETROGRADE CHOLANGIOPANCREATOGRAPHY (ERCP);  Surgeon: Iva Boop, MD;  Location: [facility] OR;  Service: Gastroenterology;  Laterality: N/A;   ESOPHAGOGASTRODUODENOSCOPY (EGD) WITH PROPOFOL N/A 06/02/2022   Procedure: ESOPHAGOGASTRODUODENOSCOPY (EGD) WITH PROPOFOL;  Surgeon: Meridee Score Netty Starring., MD;  Location: [facility] ENDOSCOPY;  Service: Gastroenterology;  Laterality: N/A;   ESOPHAGOGASTRODUODENOSCOPY (EGD) WITH PROPOFOL N/A 07/22/2022   Procedure: ESOPHAGOGASTRODUODENOSCOPY (EGD) WITH PROPOFOL;  Surgeon: Iva Boop, MD;  Location: [facility];  Service: Gastroenterology;  Laterality: N/A;   EYE SURGERY Bilateral 2022   bilateral cataracts   HEMOSTASIS CLIP PLACEMENT  06/02/2022   Procedure: HEMOSTASIS CLIP PLACEMENT;  Surgeon: Lemar Lofty., MD;  Location: [facility];  Service: Gastroenterology;;   INSERTION OF MESH N/A 03/11/2019   Procedure: Insertion Of Mesh;  Surgeon: Axel Filler, MD;  Location: [facility] OR;  Service: General;  Laterality: N/A;   INTRAOPERATIVE CHOLANGIOGRAM N/A 04/21/2022   Procedure: INTRAOPERATIVE CHOLANGIOGRAM;  Surgeon: Diamantina Monks, MD;  Location: [facility] OR;  Service: General;  Laterality: N/A;   INTRAOPERATIVE TRANSTHORACIC ECHOCARDIOGRAM N/A 03/03/2023  Procedure: INTRAOPERATIVE TRANSTHORACIC ECHOCARDIOGRAM;  Surgeon: Kathleene Hazel, MD;  Location: [facility] LAB;  Service: Open Heart Surgery;  Laterality: N/A;   IR EXCHANGE BILIARY DRAIN  02/14/2022   IR PERC CHOLECYSTOSTOMY  12/29/2021   JOINT REPLACEMENT     KNEE ARTHROSCOPY WITH MENISCAL REPAIR Right 01/30/2011   LAPAROTOMY N/A 01/04/2022   Procedure: EXPLORATORY LAPAROTOMY WITH EXTRAPERITONEAL BLADDER REPAIR;  Surgeon: Heloise Purpura, MD;   Location: [facility] OR;  Service: Urology;  Laterality: N/A;   PANCREATIC STENT PLACEMENT  06/02/2022   Procedure: PANCREATIC STENT PLACEMENT;  Surgeon: Meridee Score Netty Starring., MD;  Location: [facility];  Service: Gastroenterology;;   PANCREATIC STENT PLACEMENT  07/22/2022   Procedure: PANCREATIC STENT REMOVAL;  Surgeon: Iva Boop, MD;  Location: [facility];  Service: Gastroenterology;;   POLYPECTOMY  06/02/2022   Procedure: POLYPECTOMY;  Surgeon: Lemar Lofty., MD;  Location: [facility];  Service: Gastroenterology;;   right finger surgery     as a child   RIGHT/LEFT HEART CATH AND CORONARY ANGIOGRAPHY N/A 01/28/2023   Procedure: RIGHT/LEFT HEART CATH AND CORONARY ANGIOGRAPHY;  Surgeon: Kathleene Hazel, MD;  Location: [facility];  Service: Cardiovascular;  Laterality: N/A;   SPHINCTEROTOMY  06/02/2022   Procedure: SPHINCTEROTOMY;  Surgeon: Mansouraty, Netty Starring., MD;  Location: [facility];  Service: Gastroenterology;;   TONSILLECTOMY     TOTAL KNEE ARTHROPLASTY Left 07/28/2017   Procedure: LEFT TOTAL KNEE ARTHROPLASTY;  Surgeon: Sheral Apley, MD;  Location: [facility] OR;  Service: Orthopedics;  Laterality: Left;   TOTAL KNEE ARTHROPLASTY Right 07/29/2022   Procedure: TOTAL KNEE ARTHROPLASTY;  Surgeon: Sheral Apley, MD;  Location: [facility] ORS;  Service: Orthopedics;  Laterality: Right;   TOTAL SHOULDER ARTHROPLASTY  05/29/2011   Procedure: TOTAL SHOULDER ARTHROPLASTY;  Surgeon: Vania Rea Supple;  Location: [facility] OR;  Service: Orthopedics;  Laterality: Right;   TOTAL SHOULDER ARTHROPLASTY Left 06/08/2014   DR SUPPLE   TOTAL SHOULDER ARTHROPLASTY Left 06/08/2014   Procedure: LEFT TOTAL SHOULDER ARTHROPLASTY;  Surgeon: Senaida Lange, MD;  Location: [facility] OR;  Service: Orthopedics;  Laterality: Left;   TRANSCATHETER AORTIC VALVE REPLACEMENT, TRANSFEMORAL Left 03/03/2023   Procedure: Transcatheter Aortic Valve Replacement, Transfemoral;  Surgeon: Kathleene Hazel, MD;   Location: [facility];  Service: Open Heart Surgery;  Laterality: Left;   UMBILICAL HERNIA REPAIR N/A 03/11/2019   Procedure: LAPAROSCOPIC UMBILICAL HERNIA;  Surgeon: Axel Filler, MD;  Location: [facility] OR;  Service: General;  Laterality: N/A;    Social History   Socioeconomic History   Marital status: Married    Spouse name: Marjolin- Margie   Number of children: 2   Years of education: Not on file   Highest education level: Not on file  Occupational History   Occupation: Retired  Tobacco Use   Smoking status: Former    Current packs/day: 0.00    Average packs/day: 1.5 packs/day for 42.0 years (63.1 ttl pk-yrs)    Types: Cigarettes    Start date: 40    Quit date: 07/14/2010    Years since quitting: 12.8   Smokeless tobacco: Never   Tobacco comments:    quit smoking about 72yrs ago  Vaping Use   Vaping status: Never Used  Substance and Sexual Activity   Alcohol use: Not Currently    Comment: no alcohol since 07   Drug use: No   Sexual activity: Never    Birth control/protection: None  Other Topics Concern   Not on file  Social History Narrative   Lives [place] with wife and dog.  Former Contractor.   Social Determinants of Health   Financial Resource Strain: Low Risk  (12/24/2022)   Overall Financial Resource Strain (CARDIA)    Difficulty of Paying Living Expenses: Not hard at all  Food Insecurity: No Food Insecurity (03/03/2023)   Hunger Vital Sign    Worried About Running Out of Food in the Last Year: Never true    Ran Out of Food in the Last Year: Never true  Transportation Needs: No Transportation Needs (03/03/2023)   PRAPARE - Administrator, Civil Service (Medical): No    Lack of Transportation (Non-Medical): No  Physical Activity: Inactive (12/24/2022)   Exercise Vital Sign    Days of Exercise per Week: 0 days    Minutes of Exercise per Session: 0 min  Stress: No Stress Concern Present (12/24/2022)    [employer] of Occupational Health - Occupational Stress Questionnaire    Feeling of Stress : Not at all  Social Connections: Moderately Isolated (12/24/2022)   Social Connection and Isolation Panel [NHANES]    Frequency of Communication with Friends and Family: Three times a week    Frequency of Social Gatherings with Friends and Family: Never    Attends Religious Services: Never    Database administrator or Organizations: No    Attends Banker Meetings: Never    Marital Status: Married  Catering manager Violence: Not At Risk (03/03/2023)   Humiliation, Afraid, Rape, and Kick questionnaire    Fear of Current or Ex-Partner: No    Emotionally Abused: No    Physically Abused: No    Sexually Abused: No    Family History  Problem Relation Age of Onset   Bradycardia Mother 53       Requiring pacemeaker placement   Coronary artery disease Father 78       Died of myocardial infarction   Coronary artery disease Brother 39       Died of myocardial infarction   Obesity Daughter    Healthy Son    Colon cancer Neg Hx    Esophageal cancer Neg Hx    Stomach cancer Neg Hx    Colon polyps Neg Hx     ROS: no fevers or chills, productive cough, hemoptysis, dysphasia, odynophagia, melena, hematochezia, dysuria, hematuria, rash, seizure activity, orthopnea, PND, pedal edema, claudication. Remaining systems are negative.  Physical Exam: Well-developed well-nourished in no acute distress.  Skin is warm and dry.  HEENT is normal.  Neck is supple.  Chest is clear to auscultation with normal expansion.  Cardiovascular exam is irregular Abdominal exam nontender or distended. No masses palpated. Extremities show no edema. neuro grossly intact   A/P  1 status post TAVR-follow-up echocardiogram showed normally functioning valve.  Continue SBE prophylaxis.  2 cardiomyopathy-LV function has improved on most recent echocardiogram.  Continue carvedilol, spironolactone, present  dose of Lasix and Farxiga.  He is having orthostatic symptoms.  I will discontinue Entresto to see if this improves with allowing his blood pressure to run higher.  3 permanent atrial fibrillation-continue Coumadin with goal INR 2-3.  Note recent CTA showed left atrial appendage thrombus.  He has previously declined DOAC's due to expense.  4 coronary artery disease-significant on recent catheterization.  However patient denies chest pain.  Will continue statin.  Can consider referral for PCI in the future if he develops symptoms.  5 hypertension-blood pressure  is controlled.  Continue present medical regimen.  6 hyperlipidemia-continue statin.  7 peripheral vascular disease-he denies claudication.  Continue statin.  [name], MD

## 2023-04-30 ENCOUNTER — Ambulatory Visit: Payer: Medicare HMO | Attending: Cardiology | Admitting: Cardiology

## 2023-04-30 ENCOUNTER — Encounter: Payer: Self-pay | Admitting: Cardiology

## 2023-04-30 ENCOUNTER — Ambulatory Visit (INDEPENDENT_AMBULATORY_CARE_PROVIDER_SITE_OTHER): Payer: Medicare HMO | Admitting: *Deleted

## 2023-04-30 VITALS — BP 112/62 | HR 67 | Ht 66.0 in | Wt 140.2 lb

## 2023-04-30 DIAGNOSIS — E785 Hyperlipidemia, unspecified: Secondary | ICD-10-CM | POA: Diagnosis not present

## 2023-04-30 DIAGNOSIS — Z952 Presence of prosthetic heart valve: Secondary | ICD-10-CM

## 2023-04-30 DIAGNOSIS — I4891 Unspecified atrial fibrillation: Secondary | ICD-10-CM

## 2023-04-30 DIAGNOSIS — I10 Essential (primary) hypertension: Secondary | ICD-10-CM | POA: Diagnosis not present

## 2023-04-30 DIAGNOSIS — I25119 Atherosclerotic heart disease of native coronary artery with unspecified angina pectoris: Secondary | ICD-10-CM | POA: Diagnosis not present

## 2023-04-30 DIAGNOSIS — Z5181 Encounter for therapeutic drug level monitoring: Secondary | ICD-10-CM | POA: Diagnosis not present

## 2023-04-30 DIAGNOSIS — I4819 Other persistent atrial fibrillation: Secondary | ICD-10-CM

## 2023-04-30 LAB — POCT INR: INR: 2.3 (ref 2.0–3.0)

## 2023-04-30 NOTE — Patient Instructions (Addendum)
Description   CONTINUE TAKING WARFARIN 1.5 TABLETS DAILY, EXCEPT 2 TABLETS ON SUNDAYS AND WEDNESDAYS. Recheck INR in 3 weeks  Coumadin Clinic [phone]

## 2023-04-30 NOTE — Patient Instructions (Signed)
Medication Instructions:  Stop taking Entresto. *If you need a refill on your cardiac medications before your next appointment, please call your pharmacy*    Follow-Up: At [facility], you and your health needs are our priority.  As part of our continuing mission to provide you with exceptional heart care, we have created designated Provider Care Teams.  These Care Teams include your primary Cardiologist (physician) and Advanced Practice Providers (APPs -  Physician Assistants and Nurse Practitioners) who all work together to provide you with the care you need, when you need it.  We recommend signing up for the patient portal called "MyChart".  Sign up information is provided on this After Visit Summary.  MyChart is used to connect with patients for Virtual Visits (Telemedicine).  Patients are able to view lab/test results, encounter notes, upcoming appointments, etc.  Non-urgent messages can be sent to your provider as well.   To learn more about what you can do with MyChart, go to [email or web address].    Your next appointment:   6 month(s)  Provider:   [name], MD

## 2023-05-21 ENCOUNTER — Ambulatory Visit: Payer: Medicare HMO | Attending: Internal Medicine | Admitting: *Deleted

## 2023-05-21 DIAGNOSIS — Z5181 Encounter for therapeutic drug level monitoring: Secondary | ICD-10-CM | POA: Diagnosis not present

## 2023-05-21 DIAGNOSIS — I4819 Other persistent atrial fibrillation: Secondary | ICD-10-CM | POA: Diagnosis not present

## 2023-05-21 DIAGNOSIS — I4891 Unspecified atrial fibrillation: Secondary | ICD-10-CM | POA: Diagnosis not present

## 2023-05-21 LAB — POCT INR: INR: 2.6 (ref 2.0–3.0)

## 2023-05-21 NOTE — Patient Instructions (Signed)
Description   CONTINUE TAKING WARFARIN 1.5 TABLETS DAILY, EXCEPT 2 TABLETS ON SUNDAYS AND WEDNESDAYS. Recheck INR in 4 weeks  Coumadin Clinic [phone]

## 2023-06-18 ENCOUNTER — Ambulatory Visit: Payer: Medicare HMO | Attending: Cardiology

## 2023-06-18 DIAGNOSIS — Z5181 Encounter for therapeutic drug level monitoring: Secondary | ICD-10-CM

## 2023-06-18 DIAGNOSIS — I4891 Unspecified atrial fibrillation: Secondary | ICD-10-CM | POA: Diagnosis not present

## 2023-06-18 LAB — POCT INR: INR: 3 (ref 2.0–3.0)

## 2023-06-18 NOTE — Patient Instructions (Signed)
Description   CONTINUE TAKING WARFARIN 1.5 TABLETS DAILY, EXCEPT 2 TABLETS ON SUNDAYS AND WEDNESDAYS. Recheck INR in 5 weeks  Coumadin Clinic [phone]

## 2023-06-27 ENCOUNTER — Other Ambulatory Visit: Payer: Self-pay | Admitting: Student in an Organized Health Care Education/Training Program

## 2023-07-23 ENCOUNTER — Ambulatory Visit: Payer: Medicare HMO | Attending: Internal Medicine

## 2023-07-23 DIAGNOSIS — Z5181 Encounter for therapeutic drug level monitoring: Secondary | ICD-10-CM | POA: Diagnosis not present

## 2023-07-23 DIAGNOSIS — I4891 Unspecified atrial fibrillation: Secondary | ICD-10-CM

## 2023-07-23 LAB — POCT INR: INR: 3.7 — AB (ref 2.0–3.0)

## 2023-07-23 NOTE — Patient Instructions (Signed)
Description   HOLD TODAY'S DOSE AND THEN CONTINUE TAKING WARFARIN 1.5 TABLETS DAILY, EXCEPT 2 TABLETS ON SUNDAYS AND WEDNESDAYS. Recheck INR in 4 weeks  Coumadin Clinic [phone]

## 2023-08-20 ENCOUNTER — Ambulatory Visit: Payer: Medicare HMO

## 2023-08-26 ENCOUNTER — Ambulatory Visit: Payer: Medicare HMO | Attending: Cardiovascular Disease | Admitting: *Deleted

## 2023-08-26 DIAGNOSIS — I4891 Unspecified atrial fibrillation: Secondary | ICD-10-CM | POA: Diagnosis not present

## 2023-08-26 DIAGNOSIS — Z5181 Encounter for therapeutic drug level monitoring: Secondary | ICD-10-CM

## 2023-08-26 DIAGNOSIS — I4819 Other persistent atrial fibrillation: Secondary | ICD-10-CM | POA: Diagnosis not present

## 2023-08-26 LAB — POCT INR: INR: 4 — AB (ref 2.0–3.0)

## 2023-08-26 NOTE — Patient Instructions (Addendum)
 Description   DO NOT TAKE ANY WARFARIN TOMORROW (ALREADY TAKEN TODAY DOSE) AND THEN START TAKING WARFARIN 1.5 TABLETS DAILY, EXCEPT 2 TABLETS ON SUNDAYS. Keep dark green leafy veggies in your diet. Recheck INR in 3 weeks  Coumadin Clinic [phone]

## 2023-08-28 ENCOUNTER — Other Ambulatory Visit: Payer: Self-pay

## 2023-08-28 DIAGNOSIS — I5021 Acute systolic (congestive) heart failure: Secondary | ICD-10-CM

## 2023-08-28 MED ORDER — SPIRONOLACTONE 25 MG PO TABS
12.5000 mg | ORAL_TABLET | Freq: Every day | ORAL | 2 refills | Status: DC
Start: 2023-08-28 — End: 2024-04-27

## 2023-09-03 ENCOUNTER — Other Ambulatory Visit: Payer: Self-pay | Admitting: Student in an Organized Health Care Education/Training Program

## 2023-09-03 DIAGNOSIS — E7800 Pure hypercholesterolemia, unspecified: Secondary | ICD-10-CM

## 2023-09-09 ENCOUNTER — Telehealth: Payer: Self-pay

## 2023-09-09 DIAGNOSIS — E7800 Pure hypercholesterolemia, unspecified: Secondary | ICD-10-CM

## 2023-09-09 MED ORDER — ATORVASTATIN CALCIUM 40 MG PO TABS: 40.0000 mg | ORAL_TABLET | Freq: Every day | ORAL | 2 refills | Status: DC

## 2023-09-09 MED ORDER — ATORVASTATIN CALCIUM 40 MG PO TABS: 40.0000 mg | ORAL_TABLET | Freq: Every day | ORAL | 0 refills | Status: DC

## 2023-09-09 NOTE — Telephone Encounter (Signed)
 [facility] mail order pharmacy is requesting a refill on pt's medication atorvastatin. Would Dr. [name] like to refill this medication? Please address

## 2023-09-09 NOTE — Telephone Encounter (Signed)
 Per chart Review:  -Atorvastatin 40mg  active on patients med list -Per 04/30/23 OV patient advised to continue statin   -Patient has OV follow up with Dr. [name] 12/15/23 at 3:00pm   Refill sent.

## 2023-09-11 ENCOUNTER — Telehealth: Payer: Self-pay

## 2023-09-11 DIAGNOSIS — I5021 Acute systolic (congestive) heart failure: Secondary | ICD-10-CM

## 2023-09-11 MED ORDER — DAPAGLIFLOZIN PROPANEDIOL 10 MG PO TABS: 10.0000 mg | ORAL_TABLET | Freq: Every day | ORAL | 2 refills | Status: DC

## 2023-09-11 NOTE — Telephone Encounter (Signed)
Noted.  Refill sent to pharmacy. 

## 2023-09-11 NOTE — Telephone Encounter (Signed)
 Received a refill request via fax for Farxiga (see chart media). Please send in [place] [facility] to [facility] [place], [place]

## 2023-09-15 ENCOUNTER — Ambulatory Visit: Payer: Medicare HMO | Attending: Internal Medicine

## 2023-09-15 DIAGNOSIS — I4819 Other persistent atrial fibrillation: Secondary | ICD-10-CM | POA: Diagnosis not present

## 2023-09-15 DIAGNOSIS — I4891 Unspecified atrial fibrillation: Secondary | ICD-10-CM | POA: Diagnosis not present

## 2023-09-15 DIAGNOSIS — Z5181 Encounter for therapeutic drug level monitoring: Secondary | ICD-10-CM | POA: Diagnosis not present

## 2023-09-15 LAB — POCT INR: INR: 3.3 — AB (ref 2.0–3.0)

## 2023-09-15 NOTE — Patient Instructions (Addendum)
 Continue TAKING WARFARIN 1.5 TABLETS DAILY, EXCEPT 2 TABLETS ON SUNDAYS. Keep dark green leafy veggies in your diet. Eat greens tonight.  Recheck INR in 4 weeks  Coumadin Clinic [phone]

## 2023-10-13 ENCOUNTER — Ambulatory Visit: Attending: Cardiology

## 2023-10-13 DIAGNOSIS — Z5181 Encounter for therapeutic drug level monitoring: Secondary | ICD-10-CM | POA: Diagnosis not present

## 2023-10-13 DIAGNOSIS — I4891 Unspecified atrial fibrillation: Secondary | ICD-10-CM | POA: Diagnosis not present

## 2023-10-13 DIAGNOSIS — I4819 Other persistent atrial fibrillation: Secondary | ICD-10-CM

## 2023-10-13 LAB — POCT INR: INR: 2.5 (ref 2.0–3.0)

## 2023-10-13 NOTE — Patient Instructions (Signed)
 Continue TAKING WARFARIN 1.5 TABLETS DAILY, EXCEPT 2 TABLETS ON SUNDAYS. Keep dark green leafy veggies in your diet.   Recheck INR in 4 weeks  Coumadin Clinic [phone]

## 2023-11-05 ENCOUNTER — Other Ambulatory Visit: Payer: Self-pay | Admitting: Cardiology

## 2023-11-05 DIAGNOSIS — I25119 Atherosclerotic heart disease of native coronary artery with unspecified angina pectoris: Secondary | ICD-10-CM

## 2023-11-05 DIAGNOSIS — I4891 Unspecified atrial fibrillation: Secondary | ICD-10-CM

## 2023-11-05 DIAGNOSIS — Z5181 Encounter for therapeutic drug level monitoring: Secondary | ICD-10-CM

## 2023-11-06 NOTE — Telephone Encounter (Signed)
 Warfarin 2.5mg  refill Afib Last INR 10/13/23 Last OV 04/30/23

## 2023-11-10 ENCOUNTER — Ambulatory Visit: Attending: Cardiology | Admitting: *Deleted

## 2023-11-10 DIAGNOSIS — I4891 Unspecified atrial fibrillation: Secondary | ICD-10-CM | POA: Diagnosis not present

## 2023-11-10 DIAGNOSIS — Z5181 Encounter for therapeutic drug level monitoring: Secondary | ICD-10-CM | POA: Diagnosis not present

## 2023-11-10 DIAGNOSIS — I4819 Other persistent atrial fibrillation: Secondary | ICD-10-CM | POA: Diagnosis not present

## 2023-11-10 LAB — POCT INR: INR: 1.7 — AB (ref 2.0–3.0)

## 2023-11-10 NOTE — Patient Instructions (Signed)
 Description   Today take 2 tablets of warfarin then continue TAKING WARFARIN 1.5 TABLETS DAILY, EXCEPT 2 TABLETS ON SUNDAYS. Keep dark green leafy veggies in your diet. Recheck INR in 3 weeks  Coumadin  Clinic [phone]

## 2023-12-01 ENCOUNTER — Ambulatory Visit: Attending: Cardiology

## 2023-12-01 DIAGNOSIS — Z5181 Encounter for therapeutic drug level monitoring: Secondary | ICD-10-CM | POA: Diagnosis not present

## 2023-12-01 DIAGNOSIS — I4891 Unspecified atrial fibrillation: Secondary | ICD-10-CM

## 2023-12-01 DIAGNOSIS — I4819 Other persistent atrial fibrillation: Secondary | ICD-10-CM | POA: Diagnosis not present

## 2023-12-01 LAB — POCT INR: INR: 1.7 — AB (ref 2.0–3.0)

## 2023-12-01 NOTE — Patient Instructions (Signed)
 Today and tomorrow take 2 tablets of warfarin then continue TAKING WARFARIN 1.5 TABLETS DAILY, EXCEPT 2 TABLETS ON SUNDAYS. Keep dark green leafy veggies in your diet. Recheck INR in 2 weeks  Coumadin  Clinic [phone]

## 2023-12-03 NOTE — Progress Notes (Signed)
 HPI: FU CM, CAD, PAF and AS s/p TAVR. He also has a history of coronary artery disease with cardiomyopathy out of proportion, alcohol use, now resolved and paroxysmal atrial fibrillation. Abdominal ultrasound in February 2012 showed no aneurysm. ABIs October 2017 normal. Patient found to have recurrent atrial fibrillation September 2020 that was asymptomatic. Pt underwent successful cardioversion February 2021. Echocardiogram repeated July 2024 and showed newly reduced LV function with ejection fraction 35%, mild left ventricular hypertrophy, mild RV dysfunction, severe left atrial enlargement, moderate right atrial enlargement, mild to moderate mitral regurgitation, moderate tricuspid regurgitation, calcified aortic valve with mean gradient 20 mmHg, aortic valve area 0.64 cm suggestive of severe low-flow/low gradient aortic stenosis and mild aortic insufficiency; small pericardial effusion.  Cardiac catheterization July 2024 showed 70% mid RCA, 80% distal RCA, 99% PDA, 99% third marginal, 60% ostial LAD, 99% mid LAD, 99% first diagonal and 50% circumflex.  Subsequently had CTA that showed left atrial appendage thrombus and moderate right pleural effusion/small left pleural effusion.  Patient was not felt to be candidate for CABG/AVR given calcified aorta and comorbidities.  It was elected to treat his coronary artery disease medically and if he developed angina and may require orbital atherectomy of the RCA and LAD with long segments of stenting.  Patient subsequently underwent TAVR (26 mm Edwards SAPIEN 3 THV).  Follow-up echocardiogram October 2024 showed normal LV function, mild left ventricular hypertrophy, mild RV dysfunction, severe left atrial enlargement, mild right atrial enlargement, mild mitral regurgitation, status post aortic valve replacement with trace aortic insufficiency and mean gradient 5.7 mmHg.  Since he was last seen, he denies dyspnea, chest pain, palpitations, syncope or  bleeding.  Current Outpatient Medications  Medication Sig Dispense Refill   acetaminophen  (TYLENOL ) 500 MG tablet Take 2 tablets (1,000 mg total) by mouth 4 (four) times daily. (Patient taking differently: Take 1,000 mg by mouth every 8 (eight) hours as needed.) 120 tablet 3   allopurinol  (ZYLOPRIM ) 100 MG tablet TAKE 1 TABLET EVERY DAY 90 tablet 3   aspirin  EC 81 MG tablet Take 1 tablet (81 mg total) by mouth daily. Swallow whole.     atorvastatin  (LIPITOR) 40 MG tablet Take 1 tablet (40 mg total) by mouth daily. 90 tablet 0   carvedilol  (COREG ) 6.25 MG tablet TAKE 1 TABLET TWICE DAILY WITH MEALS (NEED MD APPOINTMENT) 180 tablet 3   dapagliflozin  propanediol (FARXIGA ) 10 MG TABS tablet Take 1 tablet (10 mg total) by mouth daily before breakfast. 90 tablet 2   docusate sodium  (COLACE) 100 MG capsule Take 1 capsule (100 mg total) by mouth 2 (two) times daily. 60 capsule 1   finasteride  (PROSCAR ) 5 MG tablet Take 1 tablet (5 mg total) by mouth daily. 30 tablet 5   furosemide  (LASIX ) 20 MG tablet TAKE 1 TABLET EVERY DAY 90 tablet 3   spironolactone  (ALDACTONE ) 25 MG tablet Take 0.5 tablets (12.5 mg total) by mouth daily. 45 tablet 2   warfarin (COUMADIN ) 2.5 MG tablet TAKE 1 AND 1/2 TO 2 TABLETS DAILY AS DIRECTED BY THE COUMADIN  CLINIC 135 tablet 1   silodosin  (RAPAFLO ) 8 MG CAPS capsule Take 1 capsule (8 mg total) by mouth daily with breakfast. 30 capsule 3   No current facility-administered medications for this visit.     Past Medical History:  Diagnosis Date   Allergic rhinitis 08/08/2013   takes Loratadine  daily    Cataract    right but immature   CHF (congestive heart failure) (HCC) 2007  Complication of anesthesia    stopped breathing - during shoulder surgery 2009-2010   Coronary artery disease 10/02/2009   Cardiac cath (October 2007): 20% left main, 60% mid LAD, 60-70% distal LAD, 30-40% first diagonal, 30% circumflex, 40% OM, 30-40% RCA    Degenerative joint disease of shoulder  08/03/2013   Bilateral, s/p right total shoulder arthroplasty 05/29/2011 and left shoulder arthroplasty 06/08/2014   Diastolic dysfunction 04/04/2016   Echo (04/04/2016): Grade I   Diverticulosis 10/07/2013   Seen on colonoscopy in 2007    Dyspnea    Erectile dysfunction 05/07/2009   Essential hypertension 08/03/2013   had been on Lisinopril  but stopped per MD   First degree AV block 03/14/2016   Gastric ulcer 10/07/2013   Seen on EGD 04/10/2006    Hemorrhoids 10/07/2013   Hyperlipidemia 10/02/2009   Paroxysmal atrial fibrillation (HCC) 07/14/2006   One episode, provoked by alcohol use disorder, briefly anticoagulated with warfarin, then switched to aspirin  alone   Pre-diabetes    S/P TAVR (transcatheter aortic valve replacement) 03/03/2023   s/p TAVR with a 26 mm Edwards S3UR via the TF approach by Dr. Abel Hoe & Weldner   Sciatica associated with disorder of lumbar spine 08/03/2013   Anterolisthesis with right L 4-5 nerve root compression.  Treated with epidural injections and Physical Therapy.    Seborrheic keratosis 10/07/2013   Severe aortic stenosis    Tubular adenoma of colon     Past Surgical History:  Procedure Laterality Date   BIOPSY  06/02/2022   Procedure: BIOPSY;  Surgeon: Brice Campi Albino Alu., MD;  Location: [facility];  Service: Gastroenterology;;   CARDIAC CATHETERIZATION  2007   CARDIOVERSION     CARDIOVERSION N/A 08/04/2019   Procedure: CARDIOVERSION;  Surgeon: Elmyra Haggard, MD;  Location[facility];  Service: Cardiovascular;  Laterality: N/A;   CHOLECYSTECTOMY N/A 04/21/2022   Procedure: LAPAROSCOPIC CHOLECYSTECTOMY;  Surgeon: Anda Bamberg, MD;  Location: [facility] OR;  Service: General;  Laterality: N/A;   COLONOSCOPY     CYSTOSCOPY N/A 01/04/2022   Procedure: Orin Birk;  Surgeon: Florencio Hunting, MD;  Location: [facility] OR;  Service: Urology;  Laterality: N/A;   ENDOSCOPIC RETROGRADE CHOLANGIOPANCREATOGRAPHY (ERCP) WITH PROPOFOL  N/A 06/02/2022   Procedure:  ENDOSCOPIC RETROGRADE CHOLANGIOPANCREATOGRAPHY (ERCP) WITH PROPOFOL ;  Surgeon: Brice Campi Albino Alu., MD;  Location: [facility];  Service: Gastroenterology;  Laterality: N/A;   ERCP N/A 04/21/2022   Procedure: ATTEMPTED ENDOSCOPIC RETROGRADE CHOLANGIOPANCREATOGRAPHY (ERCP);  Surgeon: Kenney Peacemaker, MD;  Location: [facility] OR;  Service: Gastroenterology;  Laterality: N/A;   ESOPHAGOGASTRODUODENOSCOPY (EGD) WITH PROPOFOL  N/A 06/02/2022   Procedure: ESOPHAGOGASTRODUODENOSCOPY (EGD) WITH PROPOFOL ;  Surgeon: Brice Campi Albino Alu., MD;  Location: [facility];  Service: Gastroenterology;  Laterality: N/A;   ESOPHAGOGASTRODUODENOSCOPY (EGD) WITH PROPOFOL  N/A 07/22/2022   Procedure: ESOPHAGOGASTRODUODENOSCOPY (EGD) WITH PROPOFOL ;  Surgeon: Kenney Peacemaker, MD;  Location: [facility];  Service: Gastroenterology;  Laterality: N/A;   EYE SURGERY Bilateral 2022   bilateral cataracts   HEMOSTASIS CLIP PLACEMENT  06/02/2022   Procedure: HEMOSTASIS CLIP PLACEMENT;  Surgeon: Normie Becton., MD;  Location: [facility];  Service: Gastroenterology;;   INSERTION OF MESH N/A 03/11/2019   Procedure: Insertion Of Mesh;  Surgeon: Shela Derby, MD;  Location: [facility] OR;  Service: General;  Laterality: N/A;   INTRAOPERATIVE CHOLANGIOGRAM N/A 04/21/2022   Procedure: INTRAOPERATIVE CHOLANGIOGRAM;  Surgeon: Anda Bamberg, MD;  Location: [facility] OR;  Service: General;  Laterality: N/A;   INTRAOPERATIVE TRANSTHORACIC ECHOCARDIOGRAM N/A 03/03/2023   Procedure: INTRAOPERATIVE TRANSTHORACIC ECHOCARDIOGRAM;  Surgeon: Odie Benne, MD;  Location: [facility] INVASIVE CV LAB;  Service: Open Heart Surgery;  Laterality: N/A;   IR EXCHANGE BILIARY DRAIN  02/14/2022   IR PERC CHOLECYSTOSTOMY  12/29/2021   JOINT REPLACEMENT     KNEE ARTHROSCOPY WITH MENISCAL REPAIR Right 01/30/2011   LAPAROTOMY N/A 01/04/2022   Procedure: EXPLORATORY LAPAROTOMY WITH EXTRAPERITONEAL BLADDER REPAIR;  Surgeon: Florencio Hunting, MD;  Location: [facility] OR;   Service: Urology;  Laterality: N/A;   PANCREATIC STENT PLACEMENT  06/02/2022   Procedure: PANCREATIC STENT PLACEMENT;  Surgeon: Brice Campi Albino Alu., MD;  Location: [facility];  Service: Gastroenterology;;   PANCREATIC STENT PLACEMENT  07/22/2022   Procedure: PANCREATIC STENT REMOVAL;  Surgeon: Kenney Peacemaker, MD;  Location: [facility];  Service: Gastroenterology;;   POLYPECTOMY  06/02/2022   Procedure: POLYPECTOMY;  Surgeon: Normie Becton., MD;  Location: [facility];  Service: Gastroenterology;;   right finger surgery     as a child   RIGHT/LEFT HEART CATH AND CORONARY ANGIOGRAPHY N/A 01/28/2023   Procedure: RIGHT/LEFT HEART CATH AND CORONARY ANGIOGRAPHY;  Surgeon: Odie Benne, MD;  Location: [facility] INVASIVE CV LAB;  Service: Cardiovascular;  Laterality: N/A;   SPHINCTEROTOMY  06/02/2022   Procedure: SPHINCTEROTOMY;  Surgeon: Mansouraty, Albino Alu., MD;  Location: [facility];  Service: Gastroenterology;;   TONSILLECTOMY     TOTAL KNEE ARTHROPLASTY Left 07/28/2017   Procedure: LEFT TOTAL KNEE ARTHROPLASTY;  Surgeon: Saundra Curl, MD;  Location: [facility] OR;  Service: Orthopedics;  Laterality: Left;   TOTAL KNEE ARTHROPLASTY Right 07/29/2022   Procedure: TOTAL KNEE ARTHROPLASTY;  Surgeon: Saundra Curl, MD;  Location: [facility] ORS;  Service: Orthopedics;  Laterality: Right;   TOTAL SHOULDER ARTHROPLASTY  05/29/2011   Procedure: TOTAL SHOULDER ARTHROPLASTY;  Surgeon: Genevieve Kerry Supple;  Location: [facility] OR;  Service: Orthopedics;  Laterality: Right;   TOTAL SHOULDER ARTHROPLASTY Left 06/08/2014   DR SUPPLE   TOTAL SHOULDER ARTHROPLASTY Left 06/08/2014   Procedure: LEFT TOTAL SHOULDER ARTHROPLASTY;  Surgeon: Glo Larch, MD;  Location: [facility] OR;  Service: Orthopedics;  Laterality: Left;   TRANSCATHETER AORTIC VALVE REPLACEMENT, TRANSFEMORAL Left 03/03/2023   Procedure: Transcatheter Aortic Valve Replacement, Transfemoral;  Surgeon: Odie Benne, MD;  Location: [facility]  INVASIVE CV LAB;  Service: Open Heart Surgery;  Laterality: Left;   UMBILICAL HERNIA REPAIR N/A 03/11/2019   Procedure: LAPAROSCOPIC UMBILICAL HERNIA;  Surgeon: Shela Derby, MD;  Location: [facility] OR;  Service: General;  Laterality: N/A;    Social History   Socioeconomic History   Marital status: Married    Spouse name: Marjolin- Margie   Number of children: 2   Years of education: Not on file   Highest education level: Not on file  Occupational History   Occupation: Retired  Tobacco Use   Smoking status: Former    Current packs/day: 0.00    Average packs/day: 1.5 packs/day for 42.0 years (63.1 ttl pk-yrs)    Types: Cigarettes    Start date: 35    Quit date: 07/14/2010    Years since quitting: 13.4   Smokeless tobacco: Never   Tobacco comments:    quit smoking about 58yrs ago  Vaping Use   Vaping status: Never Used  Substance and Sexual Activity   Alcohol use: Not Currently    Comment: no alcohol since 07   Drug use: No   Sexual activity: Never    Birth control/protection: None  Other Topics Concern   Not on file  Social History Narrative  Lives [place] with wife and dog.  Former Contractor.   Social Drivers of Corporate investment banker Strain: Low Risk  (12/24/2022)   Overall Financial Resource Strain (CARDIA)    Difficulty of Paying Living Expenses: Not hard at all  Food Insecurity: No Food Insecurity (03/03/2023)   Hunger Vital Sign    Worried About Running Out of Food in the Last Year: Never true    Ran Out of Food in the Last Year: Never true  Transportation Needs: No Transportation Needs (03/03/2023)   PRAPARE - Administrator, Civil Service (Medical): No    Lack of Transportation (Non-Medical): No  Physical Activity: Inactive (12/24/2022)   Exercise Vital Sign    Days of Exercise per Week: 0 days    Minutes of Exercise per Session: 0 min  Stress: No Stress Concern Present (12/24/2022)   [employer] of Occupational Health - Occupational Stress Questionnaire    Feeling of Stress : Not at all  Social Connections: Moderately Isolated (12/24/2022)   Social Connection and Isolation Panel    Frequency of Communication with Friends and Family: Three times a week    Frequency of Social Gatherings with Friends and Family: Never    Attends Religious Services: Never    Database administrator or Organizations: No    Attends Banker Meetings: Never    Marital Status: Married  Catering manager Violence: Not At Risk (03/03/2023)   Humiliation, Afraid, Rape, and Kick questionnaire    Fear of Current or Ex-Partner: No    Emotionally Abused: No    Physically Abused: No    Sexually Abused: No    Family History  Problem Relation Age of Onset   Bradycardia Mother 59       Requiring pacemeaker placement   Coronary artery disease Father 40       Died of myocardial infarction   Coronary artery disease Brother 5       Died of myocardial infarction   Obesity Daughter    Healthy Son    Colon cancer Neg Hx    Esophageal cancer Neg Hx    Stomach cancer Neg Hx    Colon polyps Neg Hx     ROS: no fevers or chills, productive cough, hemoptysis, dysphasia, odynophagia, melena, hematochezia, dysuria, hematuria, rash, seizure activity, orthopnea, PND, pedal edema, claudication. Remaining systems are negative.  Physical Exam: Well-developed well-nourished in no acute distress.  Skin is warm and dry.  HEENT is normal.  Neck is supple.  Chest is clear to auscultation with normal expansion.  Cardiovascular exam is irregular and mildly bradycardic. Abdominal exam nontender or distended. No masses palpated. Extremities show no edema. neuro grossly intact  A/P  1 status post TAVR-continue SBE prophylaxis.  Most recent echocardiogram showed normally functioning valve.  2 history of cardiomyopathy-LV function has improved on most recent echocardiogram.  Entresto  was discontinued  previously as he was having orthostatic symptoms.  His symptoms have improved.  Will continue off of this medication.  Continue carvedilol , spironolactone , Lasix  and Farxiga .  Check potassium and renal function.  3 permanent atrial fibrillation-continue Coumadin  with goal INR 2-3.  Previously declined DOAC's.  Should also be noted that previous CTA did show a left atrial appendage thrombus.  Check hemoglobin.  4 coronary artery disease-he denies chest pain.  Continue statin.  Will consider referral for PCI if he develops symptoms in the future.  5 hypertension-patient's blood pressure is  controlled.  Continue present medications.  6 hyperlipidemia-continue statin.  Check lipids and liver.  7 peripheral vascular disease-he is not having claudication.  Continue medical therapy.  Continue statin.  Alexandria Angel, MD

## 2023-12-15 ENCOUNTER — Ambulatory Visit: Payer: Medicare HMO | Attending: Cardiology | Admitting: Cardiology

## 2023-12-15 ENCOUNTER — Encounter: Payer: Self-pay | Admitting: Cardiology

## 2023-12-15 ENCOUNTER — Ambulatory Visit (INDEPENDENT_AMBULATORY_CARE_PROVIDER_SITE_OTHER)

## 2023-12-15 VITALS — BP 168/74 | HR 50 | Ht 66.0 in | Wt 159.8 lb

## 2023-12-15 DIAGNOSIS — I4891 Unspecified atrial fibrillation: Secondary | ICD-10-CM | POA: Diagnosis not present

## 2023-12-15 DIAGNOSIS — I25119 Atherosclerotic heart disease of native coronary artery with unspecified angina pectoris: Secondary | ICD-10-CM

## 2023-12-15 DIAGNOSIS — I4819 Other persistent atrial fibrillation: Secondary | ICD-10-CM

## 2023-12-15 DIAGNOSIS — Z5181 Encounter for therapeutic drug level monitoring: Secondary | ICD-10-CM | POA: Diagnosis not present

## 2023-12-15 DIAGNOSIS — I10 Essential (primary) hypertension: Secondary | ICD-10-CM | POA: Diagnosis not present

## 2023-12-15 DIAGNOSIS — E785 Hyperlipidemia, unspecified: Secondary | ICD-10-CM | POA: Diagnosis not present

## 2023-12-15 DIAGNOSIS — Z952 Presence of prosthetic heart valve: Secondary | ICD-10-CM | POA: Diagnosis not present

## 2023-12-15 LAB — POCT INR: INR: 2.5 (ref 2.0–3.0)

## 2023-12-15 NOTE — Patient Instructions (Signed)
 continue TAKING WARFARIN 1.5 TABLETS DAILY, EXCEPT 2 TABLETS ON SUNDAYS. Keep dark green leafy veggies in your diet. Recheck INR in 4 weeks  Coumadin  Clinic [phone]

## 2023-12-15 NOTE — Patient Instructions (Signed)

## 2023-12-16 ENCOUNTER — Ambulatory Visit: Payer: Self-pay | Admitting: Cardiology

## 2023-12-16 LAB — LIPID PANEL
Chol/HDL Ratio: 3.6 ratio (ref 0.0–5.0)
Cholesterol, Total: 135 mg/dL (ref 100–199)
HDL: 38 mg/dL — ABNORMAL LOW (ref >39)
LDL Chol Calc (NIH): 67 mg/dL (ref 0–99)
Triglycerides: 176 mg/dL — ABNORMAL HIGH (ref 0–149)
VLDL Cholesterol Cal: 30 mg/dL (ref 5–40)

## 2023-12-16 LAB — COMPREHENSIVE METABOLIC PANEL WITH GFR
ALT: 10 IU/L (ref 0–44)
AST: 20 IU/L (ref 0–40)
Albumin: 4.2 g/dL (ref 3.8–4.8)
Alkaline Phosphatase: 107 IU/L (ref 44–121)
BUN/Creatinine Ratio: 17 (ref 10–24)
BUN: 22 mg/dL (ref 8–27)
Bilirubin Total: 0.4 mg/dL (ref 0.0–1.2)
CO2: 21 mmol/L (ref 20–29)
Calcium: 9 mg/dL (ref 8.6–10.2)
Chloride: 102 mmol/L (ref 96–106)
Creatinine, Ser: 1.28 mg/dL — ABNORMAL HIGH (ref 0.76–1.27)
Globulin, Total: 2.8 g/dL (ref 1.5–4.5)
Glucose: 79 mg/dL (ref 70–99)
Potassium: 3.9 mmol/L (ref 3.5–5.2)
Sodium: 141 mmol/L (ref 134–144)
Total Protein: 7 g/dL (ref 6.0–8.5)
eGFR: 58 mL/min/{1.73_m2} — ABNORMAL LOW (ref >59)

## 2023-12-16 LAB — CBC
Hematocrit: 44.6 % (ref 37.5–51.0)
Hemoglobin: 15.5 g/dL (ref 13.0–17.7)
MCH: 32.4 pg (ref 26.6–33.0)
MCHC: 34.8 g/dL (ref 31.5–35.7)
MCV: 93 fL (ref 79–97)
Platelets: 170 10*3/uL (ref 150–450)
RBC: 4.79 x10E6/uL (ref 4.14–5.80)
RDW: 13 % (ref 11.6–15.4)
WBC: 10.5 10*3/uL (ref 3.4–10.8)

## 2024-01-07 ENCOUNTER — Other Ambulatory Visit: Payer: Self-pay

## 2024-01-07 DIAGNOSIS — Z952 Presence of prosthetic heart valve: Secondary | ICD-10-CM

## 2024-01-08 ENCOUNTER — Telehealth: Payer: Self-pay | Admitting: Pharmacy Technician

## 2024-01-12 ENCOUNTER — Ambulatory Visit: Attending: Cardiology

## 2024-01-12 DIAGNOSIS — I4819 Other persistent atrial fibrillation: Secondary | ICD-10-CM

## 2024-01-12 DIAGNOSIS — Z5181 Encounter for therapeutic drug level monitoring: Secondary | ICD-10-CM | POA: Diagnosis not present

## 2024-01-12 DIAGNOSIS — I4891 Unspecified atrial fibrillation: Secondary | ICD-10-CM

## 2024-01-12 LAB — POCT INR: INR: 2.1 (ref 2.0–3.0)

## 2024-01-12 NOTE — Progress Notes (Signed)
Please see anticoagulation encounter.

## 2024-01-12 NOTE — Patient Instructions (Signed)
 continue TAKING WARFARIN 1.5 TABLETS DAILY, EXCEPT 2 TABLETS ON SUNDAYS. Keep dark green leafy veggies in your diet. Recheck INR in 6 weeks  Coumadin  Clinic [phone]

## 2024-01-16 ENCOUNTER — Other Ambulatory Visit: Payer: Self-pay | Admitting: Cardiology

## 2024-01-16 DIAGNOSIS — I5021 Acute systolic (congestive) heart failure: Secondary | ICD-10-CM

## 2024-01-21 ENCOUNTER — Other Ambulatory Visit: Payer: Self-pay | Admitting: Cardiology

## 2024-01-21 DIAGNOSIS — I25119 Atherosclerotic heart disease of native coronary artery with unspecified angina pectoris: Secondary | ICD-10-CM

## 2024-02-23 ENCOUNTER — Ambulatory Visit: Attending: Cardiology | Admitting: *Deleted

## 2024-02-23 DIAGNOSIS — I4891 Unspecified atrial fibrillation: Secondary | ICD-10-CM | POA: Diagnosis not present

## 2024-02-23 DIAGNOSIS — I4819 Other persistent atrial fibrillation: Secondary | ICD-10-CM | POA: Diagnosis not present

## 2024-02-23 DIAGNOSIS — Z5181 Encounter for therapeutic drug level monitoring: Secondary | ICD-10-CM | POA: Diagnosis not present

## 2024-02-23 LAB — POCT INR: INR: 2.2 (ref 2.0–3.0)

## 2024-02-23 NOTE — Patient Instructions (Addendum)
 Description   INR-2.2; CONTINUE TAKING WARFARIN 1.5 TABLETS DAILY, EXCEPT 2 TABLETS ON SUNDAYS. Keep dark green leafy veggies in your diet. Recheck INR in 6 weeks  Coumadin  Clinic [phone]

## 2024-02-23 NOTE — Progress Notes (Signed)
  Description   INR-2.2; CONTINUE TAKING WARFARIN 1.5 TABLETS DAILY, EXCEPT 2 TABLETS ON SUNDAYS. Keep dark green leafy veggies in your diet. Recheck INR in 6 weeks  Coumadin  Clinic [phone]

## 2024-03-04 ENCOUNTER — Telehealth: Payer: Self-pay | Admitting: Pharmacy Technician

## 2024-03-04 DIAGNOSIS — I5021 Acute systolic (congestive) heart failure: Secondary | ICD-10-CM

## 2024-03-04 MED ORDER — DAPAGLIFLOZIN PROPANEDIOL 10 MG PO TABS: 10.0000 mg | ORAL_TABLET | Freq: Every day | ORAL | 2 refills | Status: AC

## 2024-03-04 NOTE — Addendum Note (Signed)
 Addended by: BLUFORD RAMP D on: 03/04/2024 03:59 PM   Modules accepted: Orders

## 2024-03-04 NOTE — Telephone Encounter (Signed)
 Pt's medication was sent to pt's pharmacy as requested. Confirmation received.

## 2024-03-04 NOTE — Telephone Encounter (Signed)
 Hi, we received a note asking for a refill for farxiga  for [facility] from [facility] please. Thank you!

## 2024-03-04 NOTE — Progress Notes (Signed)
 [facility]   MULTIDISCIPLINARY HEART VALVE CLINIC                                     Cardiology Office Note:    Date:  03/07/2024   ID:  [name], DOB [birth year], MRN [id number]  PCP:  Pcp, No  [facility] Cardiologist:  Redell Shallow, MD  [facility] Structural heart: Richard Cash, MD [facility] Electrophysiologist:  None   Referring MD: No ref. provider found   1 year s/p TAVR   History of Present Illness:    [name] is a 78 y.o. male with a hx of  CAD, PAD, cardiomyopathy, PAF on Coumadin  (DOAC cost prohibitive), HLD, HTN, cholecystitis, bladder rupture, and severe LFLG aortic stenosis s/p TAVR 03/03/2023 who presents to clinic for follow up.    [name] has known cardiac history of CAD from cath in 2007 and then PAF that began in 2020 treated with cardioversions and anticoagulation. He had a difficult time in 05/2022 from cholecystitis and complicated with sepsis and bladder rupture but had recovered. He developed fluid retention and increasing DOE and was treated with lasix  with improvement. Echo 12/2022 with LVEF=35% with global hypokinesis, mildly reduced RV function, mild to moderate MR, and severe low flow/low gradient aortic stenosis with AVA 0.64, mean gradient 20 mmHg, DVI 0.18. Cardiac cath 01/28/23 with three vessel CAD. Not felt to be a candidate for surgical CABG/AVR given calcified aorta and other comorbidities. It was elected to treat his coronary artery disease medically and if he developed angina and may require orbital atherectomy of the RCA and LAD with long segments of stenting. S/p TAVR with a 26 mm Edwards Sapien 3 Ultra Resilia THV via the left TF approach using intravascular lithotripsy on 03/03/23. 1 month echo showed improvement in EF to 60-65% with mild MR/MAC and stable TAVR valve function with trivial PVL with a mean gradient at 5.32mmHg.  Today the patient presents to clinic for follow up. No CP or SOB. No LE  edema, orthopnea or PND. No dizziness or syncope. No blood in stool or urine. No palpitations. Very active in the yard also goes to gym 3-5 day days a week with no limitations. He does rowing and other machines.   Past Medical History:  Diagnosis Date   Allergic rhinitis 08/08/2013   takes Loratadine  daily    Cataract    right but immature   CHF (congestive heart failure) (HCC) 2007   Complication of anesthesia    stopped breathing - during shoulder surgery 2009-2010   Coronary artery disease 10/02/2009   Cardiac cath (October 2007): 20% left main, 60% mid LAD, 60-70% distal LAD, 30-40% first diagonal, 30% circumflex, 40% OM, 30-40% RCA    Degenerative joint disease of shoulder 08/03/2013   Bilateral, s/p right total shoulder arthroplasty 05/29/2011 and left shoulder arthroplasty 06/08/2014   Diastolic dysfunction 04/04/2016   Echo (04/04/2016): Grade I   Diverticulosis 10/07/2013   Seen on colonoscopy in 2007    Dyspnea    Erectile dysfunction 05/07/2009   Essential hypertension 08/03/2013   had been on Lisinopril  but stopped per MD   First degree AV block 03/14/2016   Gastric ulcer 10/07/2013   Seen on EGD 04/10/2006    Hemorrhoids 10/07/2013   Hyperlipidemia 10/02/2009   Paroxysmal atrial fibrillation (HCC) 07/14/2006   One episode, provoked by alcohol use disorder, briefly anticoagulated  with warfarin, then switched to aspirin  alone   Pre-diabetes    S/P TAVR (transcatheter aortic valve replacement) 03/03/2023   s/p TAVR with a 26 mm Edwards S3UR via the TF approach by Dr. Verlin & Weldner   Sciatica associated with disorder of lumbar spine 08/03/2013   Anterolisthesis with right L 4-5 nerve root compression.  Treated with epidural injections and Physical Therapy.    Seborrheic keratosis 10/07/2013   Severe aortic stenosis    Tubular adenoma of colon      Current Medications: Current Meds  Medication Sig   acetaminophen  (TYLENOL ) 500 MG tablet Take 2 tablets (1,000  mg total) by mouth 4 (four) times daily. (Patient taking differently: Take 1,000 mg by mouth every 8 (eight) hours as needed.)   allopurinol  (ZYLOPRIM ) 100 MG tablet TAKE 1 TABLET EVERY DAY   aspirin  EC 81 MG tablet Take 1 tablet (81 mg total) by mouth daily. Swallow whole.   atorvastatin  (LIPITOR) 40 MG tablet Take 1 tablet (40 mg total) by mouth daily.   carvedilol  (COREG ) 6.25 MG tablet Take 1 tablet (6.25 mg total) by mouth 2 (two) times daily with a meal.   dapagliflozin  propanediol (FARXIGA ) 10 MG TABS tablet Take 1 tablet (10 mg total) by mouth daily before breakfast.   docusate sodium  (COLACE) 100 MG capsule Take 1 capsule (100 mg total) by mouth 2 (two) times daily.   finasteride  (PROSCAR ) 5 MG tablet Take 1 tablet (5 mg total) by mouth daily.   furosemide  (LASIX ) 20 MG tablet TAKE 1 TABLET EVERY DAY (Patient taking differently: Take 20 mg by mouth daily. Pt states he takes 2 capsules prn)   silodosin  (RAPAFLO ) 8 MG CAPS capsule Take 1 capsule (8 mg total) by mouth daily with breakfast.   spironolactone  (ALDACTONE ) 25 MG tablet Take 0.5 tablets (12.5 mg total) by mouth daily.   warfarin (COUMADIN ) 2.5 MG tablet TAKE 1 AND 1/2 TO 2 TABLETS DAILY AS DIRECTED BY THE COUMADIN  CLINIC      ROS:   Please see the history of present illness.    All other systems reviewed and are negative.  EKGs       Risk Assessment/Calculations:    CHA2DS2-VASc Score = 5   This indicates a 7.2% annual risk of stroke. The patient's score is based upon: CHF History: 1 HTN History: 1 Diabetes History: 0 Stroke History: 0 Vascular Disease History: 1 Age Score: 2 Gender Score: 0          Physical Exam:    VS:  BP (!) 130/58   Pulse 65   Ht 5' 6 (1.676 m)   Wt 158 lb 12.8 oz (72 kg)   SpO2 95%   BMI 25.63 kg/m     Wt Readings from Last 3 Encounters:  03/07/24 158 lb 12.8 oz (72 kg)  12/15/23 159 lb 12.8 oz (72.5 kg)  04/30/23 140 lb 3.2 oz (63.6 kg)     GEN: Well nourished, well  developed in no acute distress NECK: No JVD CARDIAC: irreg irreg, no murmurs, rubs, gallops RESPIRATORY:  Clear to auscultation without rales, wheezing or rhonchi  ABDOMEN: Soft, non-tender, non-distended EXTREMITIES:  No edema; No deformity.    ASSESSMENT:    1. S/P TAVR (transcatheter aortic valve replacement)   2. Heart failure with improved ejection fraction (HFimpEF) (HCC)   3. Coronary artery disease involving native coronary artery of native heart with angina pectoris (HCC)   4. PAD (peripheral artery disease) (HCC)   5. Essential  hypertension   6. Persistent atrial fibrillation (HCC)     PLAN:    In order of problems listed above:  Severe AS s/p TAVR:  -- Echo today shows EF 60%, normally functioning TAVR with a mean gradient of 4 mm hg and trivial PVL as well as mild MR.  -- NYHA class I symptoms with a marked symptomatic improvement since TAVR.  -- Continue aspirin  81mg  daily and Coumadin .  -- SBE prophylaxis discussed; the patient is edentulous and does not go to the dentist.  -- Continue regular follow up with Dr. Pietro.  HFimpEF:  -- EF 60% -- Appears euvolemic.  -- Entresto  24-26mg  BID previously discontinued with orthostasis.  -- Continue Farixga 10mg  daily, Coreg  6.25 mg BID, Spiro 12.5mg  daily and PRN lasix .    CAD:  -- Cath 01/28/23 showed severe triple vessel CAD.  -- Not a candidate for surgical CABG/AVR given calcified aorta and other comorbidities.  -- It was elected to treat his coronary artery disease medically and if he developed angina and may require orbital atherectomy of the RCA and LAD with long segments of stenting. -- No chest pain.    PAD:  -- Noted on pre TAVR scans.  -- No claudication symptoms.  -- Continue medical therapy with aspirin  81mg  daily and atorvastatin  40mg  daily.    HTN:  -- BP well controlled.  -- Treated in the context of GDMT for HFimpEF.  -- No changes.    Persistent atrial fibrillation:  -- HRs in 50-60s.  --  Continue Coumadin .      Medication Adjustments/Labs and Tests Ordered: Current medicines are reviewed at length with the patient today.  Concerns regarding medicines are outlined above.  Orders Placed This Encounter  Procedures   ECHOCARDIOGRAM COMPLETE   No orders of the defined types were placed in this encounter.   Patient Instructions  Medication Instructions:  Your physician recommends that you continue on your current medications as directed. Please refer to the Current Medication list given to you today.  *If you need a refill on your cardiac medications before your next appointment, please call your pharmacy*  Lab Work: None needed If you have labs (blood work) drawn today and your tests are completely normal, you will receive your results only by: MyChart Message (if you have MyChart) OR A paper copy in the mail If you have any lab test that is abnormal or we need to change your treatment, we will call you to review the results.  Testing/Procedures: 02/2025 Your physician has requested that you have an echocardiogram. Echocardiography is a painless test that uses sound waves to create images of your heart. It provides your doctor with information about the size and shape of your heart and how well your heart's chambers and valves are working. This procedure takes approximately one hour. There are no restrictions for this procedure. Please do NOT wear cologne, perfume, aftershave, or lotions (deodorant is allowed). Please arrive 15 minutes prior to your appointment time.  Please note: We ask at that you not bring children with you during ultrasound (echo/ vascular) testing. Due to room size and safety concerns, children are not allowed in the ultrasound rooms during exams. Our front office staff cannot provide observation of children in our lobby area while testing is being conducted. An adult accompanying a patient to their appointment will only be allowed in the ultrasound  room at the discretion of the ultrasound technician under special circumstances. We apologize for any inconvenience.   Follow-Up:  At [facility], you and your health needs are our priority.  As part of our continuing mission to provide you with exceptional heart care, our providers are all part of one team.  This team includes your primary Cardiologist (physician) and Advanced Practice Providers or APPs (Physician Assistants and Nurse Practitioners) who all work together to provide you with the care you need, when you need it.  Your next appointment:   1 year(s)  Provider:   Redell Shallow, MD    We recommend signing up for the patient portal called MyChart.  Sign up information is provided on this After Visit Summary.  MyChart is used to connect with patients for Virtual Visits (Telemedicine).  Patients are able to view lab/test results, encounter notes, upcoming appointments, etc.  Non-urgent messages can be sent to your provider as well.   To learn more about what you can do with MyChart, go to [email or web address].        Signed, Lamarr Hummer, PA-C  03/07/2024 2:08 PM     [facility]

## 2024-03-07 ENCOUNTER — Ambulatory Visit (HOSPITAL_COMMUNITY)
Admission: RE | Admit: 2024-03-07 | Discharge: 2024-03-07 | Disposition: A | Discharge status: Home / Self Care | Payer: Self-pay | Source: Ambulatory Visit | Attending: Internal Medicine | Admitting: Internal Medicine

## 2024-03-07 ENCOUNTER — Ambulatory Visit (INDEPENDENT_AMBULATORY_CARE_PROVIDER_SITE_OTHER): Payer: Self-pay | Admitting: Physician Assistant

## 2024-03-07 ENCOUNTER — Ambulatory Visit: Payer: Self-pay | Admitting: Physician Assistant

## 2024-03-07 VITALS — BP 130/58 | HR 65 | Ht 66.0 in | Wt 158.8 lb

## 2024-03-07 DIAGNOSIS — I25119 Atherosclerotic heart disease of native coronary artery with unspecified angina pectoris: Secondary | ICD-10-CM | POA: Diagnosis not present

## 2024-03-07 DIAGNOSIS — I5032 Chronic diastolic (congestive) heart failure: Secondary | ICD-10-CM | POA: Diagnosis not present

## 2024-03-07 DIAGNOSIS — I4819 Other persistent atrial fibrillation: Secondary | ICD-10-CM | POA: Diagnosis present

## 2024-03-07 DIAGNOSIS — I739 Peripheral vascular disease, unspecified: Secondary | ICD-10-CM | POA: Diagnosis present

## 2024-03-07 DIAGNOSIS — Z952 Presence of prosthetic heart valve: Secondary | ICD-10-CM | POA: Insufficient documentation

## 2024-03-07 DIAGNOSIS — I10 Essential (primary) hypertension: Secondary | ICD-10-CM | POA: Diagnosis not present

## 2024-03-07 LAB — ECHOCARDIOGRAM COMPLETE
AR max vel: 1.75 cm2
AV Area VTI: 1.61 cm2
AV Area mean vel: 1.76 cm2
AV Mean grad: 4 mmHg
AV Peak grad: 7.1 mmHg
Ao pk vel: 1.33 m/s
Area-P 1/2: 3.89 cm2
P 1/2 time: 371 ms
S' Lateral: 2.4 cm

## 2024-03-07 NOTE — Patient Instructions (Signed)
 Medication Instructions:  Your physician recommends that you continue on your current medications as directed. Please refer to the Current Medication list given to you today.  *If you need a refill on your cardiac medications before your next appointment, please call your pharmacy*  Lab Work: None needed If you have labs (blood work) drawn today and your tests are completely normal, you will receive your results only by: MyChart Message (if you have MyChart) OR A paper copy in the mail If you have any lab test that is abnormal or we need to change your treatment, we will call you to review the results.  Testing/Procedures: 02/2025 Your physician has requested that you have an echocardiogram. Echocardiography is a painless test that uses sound waves to create images of your heart. It provides your doctor with information about the size and shape of your heart and how well your heart's chambers and valves are working. This procedure takes approximately one hour. There are no restrictions for this procedure. Please do NOT wear cologne, perfume, aftershave, or lotions (deodorant is allowed). Please arrive 15 minutes prior to your appointment time.  Please note: We ask at that you not bring children with you during ultrasound (echo/ vascular) testing. Due to room size and safety concerns, children are not allowed in the ultrasound rooms during exams. Our front office staff cannot provide observation of children in our lobby area while testing is being conducted. An adult accompanying a patient to their appointment will only be allowed in the ultrasound room at the discretion of the ultrasound technician under special circumstances. We apologize for any inconvenience.   Follow-Up: At [facility], you and your health needs are our priority.  As part of our continuing mission to provide you with exceptional heart care, our providers are all part of one team.  This team includes your primary  Cardiologist (physician) and Advanced Practice Providers or APPs (Physician Assistants and Nurse Practitioners) who all work together to provide you with the care you need, when you need it.  Your next appointment:   1 year(s)  Provider:   Redell Shallow, MD    We recommend signing up for the patient portal called MyChart.  Sign up information is provided on this After Visit Summary.  MyChart is used to connect with patients for Virtual Visits (Telemedicine).  Patients are able to view lab/test results, encounter notes, upcoming appointments, etc.  Non-urgent messages can be sent to your provider as well.   To learn more about what you can do with MyChart, go to [email or web address].

## 2024-03-08 ENCOUNTER — Telehealth: Payer: Self-pay | Admitting: Pharmacy Technician

## 2024-03-08 NOTE — Telephone Encounter (Signed)
 Recevied notification from [employer] (Farxiga ) that shipment has shipped:

## 2024-03-31 ENCOUNTER — Other Ambulatory Visit: Payer: Self-pay | Admitting: Cardiology

## 2024-03-31 DIAGNOSIS — Z5181 Encounter for therapeutic drug level monitoring: Secondary | ICD-10-CM

## 2024-03-31 DIAGNOSIS — I4891 Unspecified atrial fibrillation: Secondary | ICD-10-CM

## 2024-04-04 ENCOUNTER — Other Ambulatory Visit: Payer: Self-pay | Admitting: Student in an Organized Health Care Education/Training Program

## 2024-04-04 ENCOUNTER — Other Ambulatory Visit: Payer: Self-pay | Admitting: Cardiology

## 2024-04-04 DIAGNOSIS — E7800 Pure hypercholesterolemia, unspecified: Secondary | ICD-10-CM

## 2024-04-05 ENCOUNTER — Encounter: Payer: Self-pay | Admitting: Student

## 2024-04-05 ENCOUNTER — Ambulatory Visit: Attending: Cardiology | Admitting: *Deleted

## 2024-04-05 ENCOUNTER — Ambulatory Visit (INDEPENDENT_AMBULATORY_CARE_PROVIDER_SITE_OTHER): Admitting: Student

## 2024-04-05 VITALS — BP 128/81 | HR 61 | Ht 66.0 in | Wt 157.6 lb

## 2024-04-05 DIAGNOSIS — I4891 Unspecified atrial fibrillation: Secondary | ICD-10-CM

## 2024-04-05 DIAGNOSIS — I5020 Unspecified systolic (congestive) heart failure: Secondary | ICD-10-CM | POA: Diagnosis not present

## 2024-04-05 DIAGNOSIS — I130 Hypertensive heart and chronic kidney disease with heart failure and stage 1 through stage 4 chronic kidney disease, or unspecified chronic kidney disease: Secondary | ICD-10-CM

## 2024-04-05 DIAGNOSIS — Z23 Encounter for immunization: Secondary | ICD-10-CM | POA: Insufficient documentation

## 2024-04-05 DIAGNOSIS — Z5181 Encounter for therapeutic drug level monitoring: Secondary | ICD-10-CM | POA: Diagnosis not present

## 2024-04-05 DIAGNOSIS — I10 Essential (primary) hypertension: Secondary | ICD-10-CM

## 2024-04-05 DIAGNOSIS — Z7985 Long-term (current) use of injectable non-insulin antidiabetic drugs: Secondary | ICD-10-CM

## 2024-04-05 DIAGNOSIS — I2510 Atherosclerotic heart disease of native coronary artery without angina pectoris: Secondary | ICD-10-CM | POA: Diagnosis not present

## 2024-04-05 DIAGNOSIS — I4819 Other persistent atrial fibrillation: Secondary | ICD-10-CM

## 2024-04-05 DIAGNOSIS — Z79899 Other long term (current) drug therapy: Secondary | ICD-10-CM

## 2024-04-05 DIAGNOSIS — Z7982 Long term (current) use of aspirin: Secondary | ICD-10-CM

## 2024-04-05 DIAGNOSIS — Z7902 Long term (current) use of antithrombotics/antiplatelets: Secondary | ICD-10-CM

## 2024-04-05 DIAGNOSIS — Z952 Presence of prosthetic heart valve: Secondary | ICD-10-CM

## 2024-04-05 LAB — POCT INR: INR: 2.8 (ref 2.0–3.0)

## 2024-04-05 NOTE — Assessment & Plan Note (Addendum)
 Patient with past medical history of hypertension.  Patient current medications include spironolactone  12.5 mg daily, Coreg  6.25 mg twice daily.  Blood pressure today slightly elevated at 138/64. Did improve to 128/81. Patient does check his blood pressures at home and he states they are usually in the 120s.  Did have elevated creatinine in June, will follow-up with BMP today.  Plan: - Continue spironolactone  12.5 mg daily, continue Coreg  6.25 mg daily - Has been on Entresto  in the past, but discontinued due to orthostatic hypotension - Obtain BMP - Follow-up in 3 months with blood pressure log

## 2024-04-05 NOTE — Progress Notes (Signed)
 CC: Chronic condition follow-up  HPI:  [name] is a 78 y.o. male with a past medical history of CAD, essential hypertension, persistent atrial fibrillation on warfarin, osteoarthritis, gout, prediabetes, history of aortic valve replacement who presents for follow-up visit.  Please see assessment and plan for full HPI.  Medications: Heart failure with recovered ejection fraction: Farxiga  10 mg daily, Lasix  20 mg as needed, spironolactone  12.5 mg daily, Coreg  6.25 mg twice daily A-fib: Warfarin 2.5 mg per Coumadin  clinic CAD: Aspirin  81 mg daily History of ruptured bladder: Silodosin  8 mg daily Hyperlipidemia: Lipitor 40 mg daily Gout: Allopurinol  100 mg daily BPH: Finasteride  5 mg daily Constipation: Docusate 100 mg twice daily Tylenol  1000 mg 4 times daily  Past Medical History:  Diagnosis Date   Allergic rhinitis 08/08/2013   takes Loratadine  daily    Cataract    right but immature   CHF (congestive heart failure) (HCC) 2007   Complication of anesthesia    stopped breathing - during shoulder surgery 2009-2010   Coronary artery disease 10/02/2009   Cardiac cath (October 2007): 20% left main, 60% mid LAD, 60-70% distal LAD, 30-40% first diagonal, 30% circumflex, 40% OM, 30-40% RCA    Degenerative joint disease of shoulder 08/03/2013   Bilateral, s/p right total shoulder arthroplasty 05/29/2011 and left shoulder arthroplasty 06/08/2014   Diastolic dysfunction 04/04/2016   Echo (04/04/2016): Grade I   Diverticulosis 10/07/2013   Seen on colonoscopy in 2007    Dyspnea    Erectile dysfunction 05/07/2009   Essential hypertension 08/03/2013   had been on Lisinopril  but stopped per MD   First degree AV block 03/14/2016   Gastric ulcer 10/07/2013   Seen on EGD 04/10/2006    Hemorrhoids 10/07/2013   Hyperlipidemia 10/02/2009   Paroxysmal atrial fibrillation (HCC) 07/14/2006   One episode, provoked by alcohol use disorder, briefly anticoagulated with warfarin, then  switched to aspirin  alone   Pre-diabetes    S/P TAVR (transcatheter aortic valve replacement) 03/03/2023   s/p TAVR with a 26 mm Edwards S3UR via the TF approach by Dr. Verlin & Weldner   Sciatica associated with disorder of lumbar spine 08/03/2013   Anterolisthesis with right L 4-5 nerve root compression.  Treated with epidural injections and Physical Therapy.    Seborrheic keratosis 10/07/2013   Severe aortic stenosis    Tubular adenoma of colon      Current Outpatient Medications:    acetaminophen  (TYLENOL ) 500 MG tablet, Take 2 tablets (1,000 mg total) by mouth 4 (four) times daily. (Patient taking differently: Take 1,000 mg by mouth every 8 (eight) hours as needed.), Disp: 120 tablet, Rfl: 3   allopurinol  (ZYLOPRIM ) 100 MG tablet, TAKE 1 TABLET EVERY DAY, Disp: 90 tablet, Rfl: 3   aspirin  EC 81 MG tablet, Take 1 tablet (81 mg total) by mouth daily. Swallow whole., Disp: , Rfl:    atorvastatin  (LIPITOR) 40 MG tablet, Take 1 tablet (40 mg total) by mouth daily., Disp: 90 tablet, Rfl: 0   carvedilol  (COREG ) 6.25 MG tablet, Take 1 tablet (6.25 mg total) by mouth 2 (two) times daily with a meal., Disp: 180 tablet, Rfl: 3   dapagliflozin  propanediol (FARXIGA ) 10 MG TABS tablet, Take 1 tablet (10 mg total) by mouth daily before breakfast., Disp: 90 tablet, Rfl: 2   docusate sodium  (COLACE) 100 MG capsule, Take 1 capsule (100 mg total) by mouth 2 (two) times daily., Disp: 60 capsule, Rfl: 1   finasteride  (PROSCAR ) 5 MG tablet, Take 1 tablet (  5 mg total) by mouth daily., Disp: 30 tablet, Rfl: 5   furosemide  (LASIX ) 20 MG tablet, TAKE 1 TABLET EVERY DAY (Patient taking differently: Take 20 mg by mouth daily. Pt states he takes 2 capsules prn), Disp: 90 tablet, Rfl: 1   silodosin  (RAPAFLO ) 8 MG CAPS capsule, Take 1 capsule (8 mg total) by mouth daily with breakfast., Disp: 30 capsule, Rfl: 3   spironolactone  (ALDACTONE ) 25 MG tablet, Take 0.5 tablets (12.5 mg total) by mouth daily., Disp: 45  tablet, Rfl: 2   warfarin (COUMADIN ) 2.5 MG tablet, TAKE 1 AND 1/2 TO 2 TABLETS DAILY AS DIRECTED BY THE COUMADIN  CLINIC, Disp: 135 tablet, Rfl: 3  Review of Systems:    Negative except for what is stated in HPI  Physical Exam:  Vitals:   04/05/24 0902 04/05/24 0934  BP: 138/64 128/81  Pulse: (!) 54 61  TempSrc: Oral   SpO2: 100%   Weight: 157 lb 9.6 oz (71.5 kg)   Height: 5' 6 (1.676 m)    General: Patient is sitting comfortably in the room  Head: Normocephalic, atraumatic  Cardio: Irregular rhythm, no murmurs, rubs, or gallop Pulmonary: Clear to ausculation bilaterally with no rales, rhonchi, and crackles    Assessment & Plan:   Assessment & Plan Coronary artery disease involving native coronary artery of native heart without angina pectoris Patient with past medical history of CAD.  He recently had a cath 01/28/2023.  Patient has multivessel disease, but is not a candidate for CABG at this time.  Patient is feeling well.  No acute concerns for chest pain.  He feels great today.  Plan: - Continue to follow with cardiology - Continue atorvastatin  40 mg daily - Continue aspirin  81 mg daily - Most recent lipid panel on 12/15/2023 showing LDL at goal at 67. Essential hypertension Patient with past medical history of hypertension.  Patient current medications include spironolactone  12.5 mg daily, Coreg  6.25 mg twice daily.  Blood pressure today slightly elevated at 138/64. Did improve to 128/81. Patient does check his blood pressures at home and he states they are usually in the 120s.  Did have elevated creatinine in June, will follow-up with BMP today.  Plan: - Continue spironolactone  12.5 mg daily, continue Coreg  6.25 mg daily - Has been on Entresto  in the past, but discontinued due to orthostatic hypotension - Obtain BMP - Follow-up in 3 months with blood pressure log Persistent atrial fibrillation (HCC) No acute concerns today.  Patient is on warfarin.  Plan: -  Continue warfarin per cardiology Flu vaccine need Flu vaccine administered today Heart failure with recovered ejection fraction (HFrecEF) (HCC) Patient does have a history of heart failure with recovered ejection fraction with echo in July 2024 showing EF of 35%, Most recent echo on 03/07/2024 showing EF 60%.  Patient is now status post TAVR in September 2024.  This likely helped his echo findings.  He feels great.  He is on warfarin.  His current GDMT spironolactone  and carvedilol .  He takes Farxiga  as well.  He takes Lasix  as needed.  Patient not volume overloaded today.  Plan: - Continue spironolactone  12.5 mg daily - Continue Coreg  6.25 mg twice daily - Continue Farxiga  10 mg daily - Continue Lasix  as needed - If patient does require antihypertensive, recommend Entresto  as it has been discontinued previously due to orthostatic hypotension, but would be indicated in heart failure   Patient discussed with Dr. Shawn Libby Blanch, DO Internal Medicine Resident PGY-3

## 2024-04-05 NOTE — Progress Notes (Signed)
 Internal Medicine Clinic Attending  Case discussed with the resident at the time of the visit.  We reviewed the resident's history and exam and pertinent patient test results.  I agree with the assessment, diagnosis, and plan of care documented in the resident's note.

## 2024-04-05 NOTE — Patient Instructions (Signed)
 [name],Thank you for allowing me to take part in your care today.  Here are your instructions.  1.  Please keep a blood pressure log.  2.  No changes to medications today.  3.  Please come back in 3 months.  Fill out your blood pressure log  4.  You get your flu vaccine today.  I am checking your kidney function today.  5.  I will call you with results.  PLEASE BRING YOUR MEDICATIONS TO EVERY APPOINTMENT  Thank you, Dr. Tobie  If you have any other questions please contact the internal medicine clinic at [phone] If it is after hours, please call the [place] hospital at [phone] and then ask the person who picks up for the resident on call.

## 2024-04-05 NOTE — Assessment & Plan Note (Addendum)
 Patient does have a history of heart failure with recovered ejection fraction with echo in July 2024 showing EF of 35%, Most recent echo on 03/07/2024 showing EF 60%.  Patient is now status post TAVR in September 2024.  This likely helped his echo findings.  He feels great.  He is on warfarin.  His current GDMT spironolactone  and carvedilol .  He takes Farxiga  as well.  He takes Lasix  as needed.  Patient not volume overloaded today.  Plan: - Continue spironolactone  12.5 mg daily - Continue Coreg  6.25 mg twice daily - Continue Farxiga  10 mg daily - Continue Lasix  as needed - If patient does require antihypertensive, recommend Entresto  as it has been discontinued previously due to orthostatic hypotension, but would be indicated in heart failure

## 2024-04-05 NOTE — Assessment & Plan Note (Signed)
 Flu vaccine administered today.

## 2024-04-05 NOTE — Assessment & Plan Note (Addendum)
 Patient with past medical history of CAD.  He recently had a cath 01/28/2023.  Patient has multivessel disease, but is not a candidate for CABG at this time.  Patient is feeling well.  No acute concerns for chest pain.  He feels great today.  Plan: - Continue to follow with cardiology - Continue atorvastatin  40 mg daily - Continue aspirin  81 mg daily - Most recent lipid panel on 12/15/2023 showing LDL at goal at 67.

## 2024-04-05 NOTE — Assessment & Plan Note (Signed)
 No acute concerns today.  Patient is on warfarin.  Plan: - Continue warfarin per cardiology

## 2024-04-05 NOTE — Progress Notes (Signed)
 Description   INR-2.8; CONTINUE TAKING WARFARIN 1.5 TABLETS DAILY, EXCEPT 2 TABLETS ON SUNDAYS. Keep dark green leafy veggies in your diet. Recheck INR in 6 weeks. Coumadin  Clinic [phone]

## 2024-04-05 NOTE — Patient Instructions (Signed)
 Description   INR-2.8; CONTINUE TAKING WARFARIN 1.5 TABLETS DAILY, EXCEPT 2 TABLETS ON SUNDAYS. Keep dark green leafy veggies in your diet. Recheck INR in 6 weeks. [facility] [phone]

## 2024-04-06 ENCOUNTER — Ambulatory Visit: Payer: Self-pay | Admitting: Student

## 2024-04-06 LAB — BASIC METABOLIC PANEL WITH GFR
BUN/Creatinine Ratio: 20 (ref 10–24)
BUN: 26 mg/dL (ref 8–27)
CO2: 20 mmol/L (ref 20–29)
Calcium: 9.1 mg/dL (ref 8.6–10.2)
Chloride: 100 mmol/L (ref 96–106)
Creatinine, Ser: 1.3 mg/dL — ABNORMAL HIGH (ref 0.76–1.27)
Glucose: 75 mg/dL (ref 70–99)
Potassium: 3.7 mmol/L (ref 3.5–5.2)
Sodium: 136 mmol/L (ref 134–144)
eGFR: 57 mL/min/1.73 — ABNORMAL LOW (ref >59)

## 2024-04-18 ENCOUNTER — Other Ambulatory Visit: Payer: Self-pay

## 2024-04-18 MED ORDER — ALLOPURINOL 100 MG PO TABS: 100.0000 mg | ORAL_TABLET | Freq: Every day | ORAL | 3 refills | Status: AC

## 2024-04-18 NOTE — Telephone Encounter (Signed)
 Medication sent to pharmacy

## 2024-04-27 ENCOUNTER — Other Ambulatory Visit: Payer: Self-pay | Admitting: Cardiology

## 2024-04-27 DIAGNOSIS — I5021 Acute systolic (congestive) heart failure: Secondary | ICD-10-CM

## 2024-05-17 ENCOUNTER — Telehealth: Payer: Self-pay | Admitting: Cardiology

## 2024-05-17 ENCOUNTER — Ambulatory Visit: Attending: Cardiology | Admitting: *Deleted

## 2024-05-17 DIAGNOSIS — I4819 Other persistent atrial fibrillation: Secondary | ICD-10-CM

## 2024-05-17 DIAGNOSIS — I4891 Unspecified atrial fibrillation: Secondary | ICD-10-CM

## 2024-05-17 DIAGNOSIS — Z5181 Encounter for therapeutic drug level monitoring: Secondary | ICD-10-CM | POA: Diagnosis not present

## 2024-05-17 LAB — POCT INR: INR: 2.6 (ref 2.0–3.0)

## 2024-05-17 NOTE — Progress Notes (Signed)
 Description   INR-2.6; CONTINUE TAKING WARFARIN 1.5 TABLETS DAILY, EXCEPT 2 TABLETS ON SUNDAYS. Keep dark green leafy veggies in your diet. Recheck INR in 6 weeks. [facility] [phone]

## 2024-05-17 NOTE — Patient Instructions (Signed)
 Description   INR-2.6; CONTINUE TAKING WARFARIN 1.5 TABLETS DAILY, EXCEPT 2 TABLETS ON SUNDAYS. Keep dark green leafy veggies in your diet. Recheck INR in 6 weeks. [facility] [phone]

## 2024-05-17 NOTE — Telephone Encounter (Signed)
 Patient dropped off patient assistance paperwork on 05/17/2024 @ 8:20am.  Location: [facility] mail box. *Give to Debbie per pt

## 2024-06-12 ENCOUNTER — Other Ambulatory Visit: Payer: Self-pay | Admitting: Cardiology

## 2024-06-12 DIAGNOSIS — I5021 Acute systolic (congestive) heart failure: Secondary | ICD-10-CM

## 2024-06-13 ENCOUNTER — Telehealth: Payer: Self-pay | Admitting: Pharmacy Technician

## 2024-06-13 NOTE — Telephone Encounter (Signed)
° °  [employer]  pap renewal 2026

## 2024-06-28 ENCOUNTER — Ambulatory Visit: Admitting: Pharmacist

## 2024-06-28 DIAGNOSIS — I4819 Other persistent atrial fibrillation: Secondary | ICD-10-CM

## 2024-06-28 DIAGNOSIS — Z5181 Encounter for therapeutic drug level monitoring: Secondary | ICD-10-CM

## 2024-06-28 DIAGNOSIS — I4891 Unspecified atrial fibrillation: Secondary | ICD-10-CM | POA: Diagnosis not present

## 2024-06-28 LAB — POCT INR: INR: 2.9 (ref 2.0–3.0)

## 2024-06-28 NOTE — Patient Instructions (Signed)
 Description   INR-2.9; CONTINUE TAKING WARFARIN 1.5 TABLETS DAILY, EXCEPT 2 TABLETS ON SUNDAYS. Keep dark green leafy veggies in your diet. Recheck INR in 6 weeks. [facility] [phone]

## 2024-06-28 NOTE — Progress Notes (Signed)
 Description   INR-2.9; CONTINUE TAKING WARFARIN 1.5 TABLETS DAILY, EXCEPT 2 TABLETS ON SUNDAYS. Keep dark green leafy veggies in your diet. Recheck INR in 6 weeks. [facility] [phone]

## 2024-08-01 ENCOUNTER — Telehealth: Payer: Self-pay | Admitting: Pharmacy Technician

## 2024-08-01 NOTE — Telephone Encounter (Signed)
   [facility]  [place]

## 2024-08-09 ENCOUNTER — Ambulatory Visit

## 2025-03-07 ENCOUNTER — Other Ambulatory Visit (HOSPITAL_COMMUNITY)
